# Patient Record
Sex: Female | Born: 1960
Health system: Southern US, Community
[De-identification: ages and names within clinical notes are randomized; demographics above are authoritative.]

## PROBLEM LIST (undated history)

## (undated) ENCOUNTER — Emergency Department (HOSPITAL_COMMUNITY): Payer: Medicaid Other | Source: Home / Self Care

## (undated) DIAGNOSIS — L97509 Non-pressure chronic ulcer of other part of unspecified foot with unspecified severity: Secondary | ICD-10-CM

## (undated) DIAGNOSIS — E7800 Pure hypercholesterolemia, unspecified: Secondary | ICD-10-CM

## (undated) DIAGNOSIS — R809 Proteinuria, unspecified: Secondary | ICD-10-CM

## (undated) DIAGNOSIS — Z720 Tobacco use: Secondary | ICD-10-CM

## (undated) DIAGNOSIS — E1142 Type 2 diabetes mellitus with diabetic polyneuropathy: Secondary | ICD-10-CM

## (undated) DIAGNOSIS — G629 Polyneuropathy, unspecified: Secondary | ICD-10-CM

## (undated) DIAGNOSIS — E119 Type 2 diabetes mellitus without complications: Secondary | ICD-10-CM

## (undated) DIAGNOSIS — E669 Obesity, unspecified: Secondary | ICD-10-CM

## (undated) DIAGNOSIS — K5900 Constipation, unspecified: Secondary | ICD-10-CM

## (undated) DIAGNOSIS — E559 Vitamin D deficiency, unspecified: Secondary | ICD-10-CM

## (undated) DIAGNOSIS — L0390 Cellulitis, unspecified: Secondary | ICD-10-CM

## (undated) DIAGNOSIS — I739 Peripheral vascular disease, unspecified: Secondary | ICD-10-CM

## (undated) DIAGNOSIS — I2510 Atherosclerotic heart disease of native coronary artery without angina pectoris: Secondary | ICD-10-CM

## (undated) DIAGNOSIS — J449 Chronic obstructive pulmonary disease, unspecified: Secondary | ICD-10-CM

## (undated) DIAGNOSIS — E1140 Type 2 diabetes mellitus with diabetic neuropathy, unspecified: Secondary | ICD-10-CM

## (undated) DIAGNOSIS — I10 Essential (primary) hypertension: Secondary | ICD-10-CM

## (undated) DIAGNOSIS — I509 Heart failure, unspecified: Secondary | ICD-10-CM

## (undated) DIAGNOSIS — B351 Tinea unguium: Secondary | ICD-10-CM

## (undated) DIAGNOSIS — I5031 Acute diastolic (congestive) heart failure: Secondary | ICD-10-CM

## (undated) DIAGNOSIS — I214 Non-ST elevation (NSTEMI) myocardial infarction: Secondary | ICD-10-CM

## (undated) DIAGNOSIS — R079 Chest pain, unspecified: Secondary | ICD-10-CM

## (undated) DIAGNOSIS — R0600 Dyspnea, unspecified: Secondary | ICD-10-CM

## (undated) DIAGNOSIS — R519 Headache, unspecified: Secondary | ICD-10-CM

## (undated) DIAGNOSIS — N186 End stage renal disease: Secondary | ICD-10-CM

## (undated) DIAGNOSIS — IMO0002 Reserved for concepts with insufficient information to code with codable children: Secondary | ICD-10-CM

## (undated) DIAGNOSIS — Z8674 Personal history of sudden cardiac arrest: Secondary | ICD-10-CM

## (undated) DIAGNOSIS — E1151 Type 2 diabetes mellitus with diabetic peripheral angiopathy without gangrene: Secondary | ICD-10-CM

## (undated) DIAGNOSIS — Z9889 Other specified postprocedural states: Secondary | ICD-10-CM

## (undated) DIAGNOSIS — D649 Anemia, unspecified: Secondary | ICD-10-CM

## (undated) DIAGNOSIS — E11621 Type 2 diabetes mellitus with foot ulcer: Secondary | ICD-10-CM

## (undated) DIAGNOSIS — E785 Hyperlipidemia, unspecified: Secondary | ICD-10-CM

## (undated) DIAGNOSIS — N184 Chronic kidney disease, stage 4 (severe): Secondary | ICD-10-CM

## (undated) DIAGNOSIS — E781 Pure hyperglyceridemia: Secondary | ICD-10-CM

## (undated) DIAGNOSIS — N952 Postmenopausal atrophic vaginitis: Secondary | ICD-10-CM

## (undated) DIAGNOSIS — M79606 Pain in leg, unspecified: Secondary | ICD-10-CM

## (undated) DIAGNOSIS — Z89432 Acquired absence of left foot: Secondary | ICD-10-CM

## (undated) DIAGNOSIS — R0609 Other forms of dyspnea: Secondary | ICD-10-CM

## (undated) DIAGNOSIS — I5032 Chronic diastolic (congestive) heart failure: Secondary | ICD-10-CM

## (undated) DIAGNOSIS — N189 Chronic kidney disease, unspecified: Secondary | ICD-10-CM

## (undated) DIAGNOSIS — I110 Hypertensive heart disease with heart failure: Secondary | ICD-10-CM

## (undated) HISTORY — DX: Proteinuria, unspecified: R80.9

## (undated) HISTORY — DX: Tobacco use: Z72.0

## (undated) HISTORY — DX: Cellulitis, unspecified: L03.90

## (undated) HISTORY — DX: Peripheral vascular disease, unspecified: I73.9

## (undated) HISTORY — DX: Other forms of dyspnea: R06.09

## (undated) HISTORY — DX: Personal history of sudden cardiac arrest: Z86.74

## (undated) HISTORY — DX: Hyperlipidemia, unspecified: E78.5

## (undated) HISTORY — DX: Chest pain, unspecified: R07.9

## (undated) HISTORY — DX: Type 2 diabetes mellitus with foot ulcer: E11.621

## (undated) HISTORY — DX: Heart failure, unspecified: I50.9

## (undated) HISTORY — DX: Obesity, unspecified: E66.9

## (undated) HISTORY — PX: ABDOMINAL HYSTERECTOMY: SHX81

## (undated) HISTORY — DX: Chronic kidney disease, stage 4 (severe): N18.4

## (undated) HISTORY — DX: Pain in leg, unspecified: M79.606

## (undated) HISTORY — DX: Polyneuropathy, unspecified: G62.9

## (undated) HISTORY — DX: Non-ST elevation (NSTEMI) myocardial infarction: I21.4

## (undated) HISTORY — DX: Type 2 diabetes mellitus with diabetic peripheral angiopathy without gangrene: E11.51

## (undated) HISTORY — DX: Vitamin D deficiency, unspecified: E55.9

## (undated) HISTORY — DX: Tinea unguium: B35.1

## (undated) HISTORY — DX: Acute diastolic (congestive) heart failure: I50.31

## (undated) HISTORY — DX: Type 2 diabetes mellitus with diabetic neuropathy, unspecified: E11.40

## (undated) HISTORY — DX: Chronic kidney disease, unspecified: N18.9

## (undated) HISTORY — DX: Non-pressure chronic ulcer of other part of unspecified foot with unspecified severity: L97.509

## (undated) HISTORY — DX: Type 2 diabetes mellitus with diabetic polyneuropathy: E11.42

## (undated) HISTORY — DX: Type 2 diabetes mellitus without complications: E11.9

## (undated) HISTORY — DX: Reserved for concepts with insufficient information to code with codable children: IMO0002

## (undated) HISTORY — DX: Postmenopausal atrophic vaginitis: N95.2

## (undated) HISTORY — DX: Anemia, unspecified: D64.9

## (undated) HISTORY — DX: Hypertensive heart disease with heart failure: I11.0

## (undated) HISTORY — DX: Chronic obstructive pulmonary disease, unspecified: J44.9

## (undated) HISTORY — DX: Pure hyperglyceridemia: E78.1

## (undated) HISTORY — DX: Essential (primary) hypertension: I10

## (undated) HISTORY — DX: Atherosclerotic heart disease of native coronary artery without angina pectoris: I25.10

## (undated) HISTORY — PX: COLONOSCOPY: SHX174

## (undated) HISTORY — DX: Other specified postprocedural states: Z98.890

## (undated) HISTORY — DX: Acquired absence of left foot: Z89.432

## (undated) HISTORY — DX: Chronic diastolic (congestive) heart failure: I50.32

---

## 2009-02-14 ENCOUNTER — Encounter: Admission: RE | Admit: 2009-02-14 | Discharge: 2009-02-14 | Payer: Self-pay | Admitting: Gastroenterology

## 2009-04-30 ENCOUNTER — Ambulatory Visit (HOSPITAL_BASED_OUTPATIENT_CLINIC_OR_DEPARTMENT_OTHER): Admission: RE | Admit: 2009-04-30 | Discharge: 2009-04-30 | Payer: Self-pay | Admitting: Internal Medicine

## 2009-04-30 ENCOUNTER — Ambulatory Visit: Payer: Self-pay | Admitting: Radiology

## 2012-06-07 DIAGNOSIS — E119 Type 2 diabetes mellitus without complications: Secondary | ICD-10-CM

## 2012-06-07 DIAGNOSIS — N952 Postmenopausal atrophic vaginitis: Secondary | ICD-10-CM

## 2012-06-07 HISTORY — DX: Type 2 diabetes mellitus without complications: E11.9

## 2012-06-07 HISTORY — DX: Postmenopausal atrophic vaginitis: N95.2

## 2012-06-10 DIAGNOSIS — Z9889 Other specified postprocedural states: Secondary | ICD-10-CM

## 2012-06-10 DIAGNOSIS — Z720 Tobacco use: Secondary | ICD-10-CM | POA: Insufficient documentation

## 2012-06-10 DIAGNOSIS — M79606 Pain in leg, unspecified: Secondary | ICD-10-CM | POA: Insufficient documentation

## 2012-06-10 DIAGNOSIS — I10 Essential (primary) hypertension: Secondary | ICD-10-CM

## 2012-06-10 HISTORY — DX: Essential (primary) hypertension: I10

## 2012-06-10 HISTORY — DX: Pain in leg, unspecified: M79.606

## 2012-06-10 HISTORY — DX: Other specified postprocedural states: Z98.890

## 2012-06-10 HISTORY — DX: Tobacco use: Z72.0

## 2012-06-15 ENCOUNTER — Other Ambulatory Visit (HOSPITAL_COMMUNITY): Payer: Self-pay | Admitting: Family Medicine

## 2012-06-15 DIAGNOSIS — M79609 Pain in unspecified limb: Secondary | ICD-10-CM

## 2012-06-15 DIAGNOSIS — R0989 Other specified symptoms and signs involving the circulatory and respiratory systems: Secondary | ICD-10-CM

## 2012-06-22 ENCOUNTER — Ambulatory Visit (HOSPITAL_COMMUNITY)
Admission: RE | Admit: 2012-06-22 | Discharge: 2012-06-22 | Disposition: A | Discharge status: Home / Self Care | Payer: PRIVATE HEALTH INSURANCE | Source: Ambulatory Visit | Attending: Cardiovascular Disease | Admitting: Cardiovascular Disease

## 2012-06-22 DIAGNOSIS — M79609 Pain in unspecified limb: Secondary | ICD-10-CM | POA: Insufficient documentation

## 2012-06-22 DIAGNOSIS — R0989 Other specified symptoms and signs involving the circulatory and respiratory systems: Secondary | ICD-10-CM

## 2012-06-22 DIAGNOSIS — I70219 Atherosclerosis of native arteries of extremities with intermittent claudication, unspecified extremity: Secondary | ICD-10-CM | POA: Insufficient documentation

## 2012-06-22 NOTE — Progress Notes (Signed)
ABI Completed. Moderate arterial disease on the right and severe arterial disease on the left. Rebekah Johnson

## 2012-06-30 ENCOUNTER — Other Ambulatory Visit: Payer: Self-pay | Admitting: Otolaryngology

## 2012-06-30 DIAGNOSIS — E079 Disorder of thyroid, unspecified: Secondary | ICD-10-CM

## 2012-07-21 ENCOUNTER — Telehealth: Payer: Self-pay | Admitting: Family Medicine

## 2012-07-21 ENCOUNTER — Other Ambulatory Visit: Payer: Self-pay | Admitting: Family Medicine

## 2012-07-21 DIAGNOSIS — F17200 Nicotine dependence, unspecified, uncomplicated: Secondary | ICD-10-CM

## 2012-07-21 DIAGNOSIS — R0989 Other specified symptoms and signs involving the circulatory and respiratory systems: Secondary | ICD-10-CM

## 2012-07-21 DIAGNOSIS — M79609 Pain in unspecified limb: Secondary | ICD-10-CM

## 2012-07-26 ENCOUNTER — Telehealth (HOSPITAL_COMMUNITY): Payer: Self-pay | Admitting: Family Medicine

## 2012-07-28 DIAGNOSIS — E559 Vitamin D deficiency, unspecified: Secondary | ICD-10-CM

## 2012-07-28 DIAGNOSIS — E781 Pure hyperglyceridemia: Secondary | ICD-10-CM | POA: Insufficient documentation

## 2012-07-28 DIAGNOSIS — I10 Essential (primary) hypertension: Secondary | ICD-10-CM | POA: Insufficient documentation

## 2012-07-28 DIAGNOSIS — I739 Peripheral vascular disease, unspecified: Secondary | ICD-10-CM

## 2012-07-28 HISTORY — DX: Vitamin D deficiency, unspecified: E55.9

## 2012-07-28 HISTORY — DX: Pure hyperglyceridemia: E78.1

## 2012-07-28 HISTORY — DX: Peripheral vascular disease, unspecified: I73.9

## 2012-08-10 ENCOUNTER — Ambulatory Visit
Admission: RE | Admit: 2012-08-10 | Discharge: 2012-08-10 | Disposition: A | Discharge status: Home / Self Care | Payer: PRIVATE HEALTH INSURANCE | Source: Ambulatory Visit | Attending: Otolaryngology | Admitting: Otolaryngology

## 2012-08-10 ENCOUNTER — Other Ambulatory Visit (HOSPITAL_COMMUNITY)
Admission: RE | Admit: 2012-08-10 | Discharge: 2012-08-10 | Disposition: A | Discharge status: Home / Self Care | Payer: PRIVATE HEALTH INSURANCE | Source: Ambulatory Visit | Attending: Otolaryngology | Admitting: Otolaryngology

## 2012-08-10 DIAGNOSIS — E079 Disorder of thyroid, unspecified: Secondary | ICD-10-CM

## 2012-08-10 DIAGNOSIS — E041 Nontoxic single thyroid nodule: Secondary | ICD-10-CM | POA: Insufficient documentation

## 2012-09-02 DIAGNOSIS — E785 Hyperlipidemia, unspecified: Secondary | ICD-10-CM

## 2012-09-02 DIAGNOSIS — G629 Polyneuropathy, unspecified: Secondary | ICD-10-CM | POA: Insufficient documentation

## 2012-09-02 DIAGNOSIS — E1151 Type 2 diabetes mellitus with diabetic peripheral angiopathy without gangrene: Secondary | ICD-10-CM

## 2012-09-02 HISTORY — DX: Type 2 diabetes mellitus with diabetic peripheral angiopathy without gangrene: E11.51

## 2012-09-02 HISTORY — DX: Hyperlipidemia, unspecified: E78.5

## 2012-09-02 HISTORY — DX: Polyneuropathy, unspecified: G62.9

## 2012-09-29 ENCOUNTER — Encounter (INDEPENDENT_AMBULATORY_CARE_PROVIDER_SITE_OTHER): Payer: PRIVATE HEALTH INSURANCE | Admitting: Ophthalmology

## 2012-09-29 DIAGNOSIS — E11319 Type 2 diabetes mellitus with unspecified diabetic retinopathy without macular edema: Secondary | ICD-10-CM

## 2012-09-29 DIAGNOSIS — I10 Essential (primary) hypertension: Secondary | ICD-10-CM

## 2012-09-29 DIAGNOSIS — E1139 Type 2 diabetes mellitus with other diabetic ophthalmic complication: Secondary | ICD-10-CM

## 2012-09-29 DIAGNOSIS — H43819 Vitreous degeneration, unspecified eye: Secondary | ICD-10-CM

## 2012-09-29 DIAGNOSIS — H35039 Hypertensive retinopathy, unspecified eye: Secondary | ICD-10-CM

## 2012-09-29 DIAGNOSIS — H3581 Retinal edema: Secondary | ICD-10-CM

## 2012-10-11 ENCOUNTER — Encounter (INDEPENDENT_AMBULATORY_CARE_PROVIDER_SITE_OTHER): Payer: PRIVATE HEALTH INSURANCE | Admitting: Ophthalmology

## 2012-10-11 DIAGNOSIS — H3581 Retinal edema: Secondary | ICD-10-CM

## 2012-10-11 DIAGNOSIS — E1139 Type 2 diabetes mellitus with other diabetic ophthalmic complication: Secondary | ICD-10-CM

## 2012-10-25 ENCOUNTER — Other Ambulatory Visit (INDEPENDENT_AMBULATORY_CARE_PROVIDER_SITE_OTHER): Payer: PRIVATE HEALTH INSURANCE | Admitting: Ophthalmology

## 2012-10-25 DIAGNOSIS — H3581 Retinal edema: Secondary | ICD-10-CM

## 2012-10-25 DIAGNOSIS — E1139 Type 2 diabetes mellitus with other diabetic ophthalmic complication: Secondary | ICD-10-CM

## 2013-03-01 ENCOUNTER — Ambulatory Visit (INDEPENDENT_AMBULATORY_CARE_PROVIDER_SITE_OTHER): Payer: PRIVATE HEALTH INSURANCE | Admitting: Ophthalmology

## 2013-03-15 ENCOUNTER — Ambulatory Visit (INDEPENDENT_AMBULATORY_CARE_PROVIDER_SITE_OTHER): Payer: PRIVATE HEALTH INSURANCE | Admitting: Ophthalmology

## 2013-03-15 DIAGNOSIS — I779 Disorder of arteries and arterioles, unspecified: Secondary | ICD-10-CM | POA: Insufficient documentation

## 2013-03-23 ENCOUNTER — Ambulatory Visit (INDEPENDENT_AMBULATORY_CARE_PROVIDER_SITE_OTHER): Payer: PRIVATE HEALTH INSURANCE | Admitting: Ophthalmology

## 2015-06-29 ENCOUNTER — Emergency Department (HOSPITAL_BASED_OUTPATIENT_CLINIC_OR_DEPARTMENT_OTHER): Payer: Self-pay

## 2015-06-29 ENCOUNTER — Emergency Department (HOSPITAL_BASED_OUTPATIENT_CLINIC_OR_DEPARTMENT_OTHER)
Admission: EM | Admit: 2015-06-29 | Discharge: 2015-06-30 | Disposition: A | Discharge status: Home / Self Care | Payer: Self-pay | Attending: Emergency Medicine | Admitting: Emergency Medicine

## 2015-06-29 ENCOUNTER — Encounter (HOSPITAL_BASED_OUTPATIENT_CLINIC_OR_DEPARTMENT_OTHER): Payer: Self-pay | Admitting: *Deleted

## 2015-06-29 DIAGNOSIS — E11628 Type 2 diabetes mellitus with other skin complications: Secondary | ICD-10-CM

## 2015-06-29 DIAGNOSIS — E1169 Type 2 diabetes mellitus with other specified complication: Secondary | ICD-10-CM | POA: Insufficient documentation

## 2015-06-29 DIAGNOSIS — I10 Essential (primary) hypertension: Secondary | ICD-10-CM | POA: Insufficient documentation

## 2015-06-29 DIAGNOSIS — F17210 Nicotine dependence, cigarettes, uncomplicated: Secondary | ICD-10-CM | POA: Insufficient documentation

## 2015-06-29 DIAGNOSIS — L089 Local infection of the skin and subcutaneous tissue, unspecified: Secondary | ICD-10-CM | POA: Insufficient documentation

## 2015-06-29 HISTORY — DX: Essential (primary) hypertension: I10

## 2015-06-29 HISTORY — DX: Pure hypercholesterolemia, unspecified: E78.00

## 2015-06-29 LAB — CBC WITH DIFFERENTIAL/PLATELET
BASOS ABS: 0 10*3/uL (ref 0.0–0.1)
Basophils Relative: 0 %
EOS ABS: 0.2 10*3/uL (ref 0.0–0.7)
EOS PCT: 1 %
HCT: 42.1 % (ref 36.0–46.0)
Hemoglobin: 13.9 g/dL (ref 12.0–15.0)
LYMPHS ABS: 3.7 10*3/uL (ref 0.7–4.0)
Lymphocytes Relative: 33 %
MCH: 26.5 pg (ref 26.0–34.0)
MCHC: 33 g/dL (ref 30.0–36.0)
MCV: 80.2 fL (ref 78.0–100.0)
Monocytes Absolute: 0.9 10*3/uL (ref 0.1–1.0)
Monocytes Relative: 8 %
Neutro Abs: 6.6 10*3/uL (ref 1.7–7.7)
Neutrophils Relative %: 58 %
PLATELETS: 305 10*3/uL (ref 150–400)
RBC: 5.25 MIL/uL — AB (ref 3.87–5.11)
RDW: 14.1 % (ref 11.5–15.5)
WBC: 11.4 10*3/uL — AB (ref 4.0–10.5)

## 2015-06-29 LAB — BASIC METABOLIC PANEL
Anion gap: 11 (ref 5–15)
BUN: 17 mg/dL (ref 6–20)
CALCIUM: 9.9 mg/dL (ref 8.9–10.3)
CO2: 26 mmol/L (ref 22–32)
Chloride: 101 mmol/L (ref 101–111)
Creatinine, Ser: 1.14 mg/dL — ABNORMAL HIGH (ref 0.44–1.00)
GFR calc Af Amer: >60 mL/min (ref >60)
GFR, EST NON AFRICAN AMERICAN: 52 mL/min — AB (ref >60)
Glucose, Bld: 187 mg/dL — ABNORMAL HIGH (ref 65–99)
POTASSIUM: 3.5 mmol/L (ref 3.5–5.1)
SODIUM: 138 mmol/L (ref 135–145)

## 2015-06-29 LAB — CBG MONITORING, ED: GLUCOSE-CAPILLARY: 217 mg/dL — AB (ref 65–99)

## 2015-06-29 MED ORDER — GABAPENTIN 300 MG PO CAPS: 300.0000 mg | ORAL_CAPSULE | Freq: Three times a day (TID) | ORAL | Status: DC

## 2015-06-29 MED ORDER — CIPROFLOXACIN IN D5W 400 MG/200ML IV SOLN: 400.0000 mg | Freq: Once | INTRAVENOUS | Status: DC

## 2015-06-29 MED ORDER — CLINDAMYCIN HCL 150 MG PO CAPS: 450.0000 mg | ORAL_CAPSULE | Freq: Three times a day (TID) | ORAL | Status: DC

## 2015-06-29 MED ORDER — CIPROFLOXACIN HCL 500 MG PO TABS: 500.0000 mg | ORAL_TABLET | Freq: Once | ORAL | Status: DC

## 2015-06-29 MED ORDER — CIPROFLOXACIN HCL 500 MG PO TABS
500.0000 mg | ORAL_TABLET | Freq: Once | ORAL | Status: AC
  Administered 2015-06-29: 500 mg via ORAL
  Filled 2015-06-29: qty 1

## 2015-06-29 MED ORDER — CLINDAMYCIN HCL 150 MG PO CAPS
300.0000 mg | ORAL_CAPSULE | Freq: Once | ORAL | Status: AC
  Administered 2015-06-29: 300 mg via ORAL
  Filled 2015-06-29: qty 2

## 2015-06-29 NOTE — ED Provider Notes (Signed)
CSN: [id number]     Arrival date & time 06/29/15  1655 History   First MD Initiated Contact with Patient 06/29/15 1953     Chief Complaint  Patient presents with  . Foot Swelling     (Consider location/radiation/quality/duration/timing/severity/associated sxs/prior Treatment) HPI Patient presents to the emergency department with foot pain and swelling.  Patient states that her second toe on the left foot is swollen and the toenail fell off.  Patient states that movement and palpation make the pain worse.  She states she is diabetic and does have some neuropathy.  Patient states that nothing seems make the condition better, movement and palpation makes the pain worse. The patient denies chest pain, shortness of breath, headache,blurred vision, neck pain, fever, cough, weakness, numbness, dizziness, anorexia, edema, abdominal pain, nausea, vomiting, diarrhea, rash, back pain, dysuria, hematemesis, bloody stool, near syncope, or syncope.  Patient did not take any medication prior to arrival Past Medical History  Diagnosis Date  . Diabetes mellitus without complication ([place])   . Hypertension   . High cholesterol    Past Surgical History  Procedure Laterality Date  . Abdominal hysterectomy     No family history on file. Social History  Substance Use Topics  . Smoking status: Current Every Day Smoker -- 1.00 packs/day    Types: Cigarettes  . Smokeless tobacco: None  . Alcohol Use: No   OB History    No data available     Review of Systems  All other systems negative except as documented in the HPI. All pertinent positives and negatives as reviewed in the HPI.  Allergies  Review of patient's allergies indicates no known allergies.  Home Medications   Prior to Admission medications   Not on File   BP 145/92 mmHg  Pulse 62  Temp(Src) 98 F (36.7 C) (Oral)  Resp 18  Wt 81.647 kg  SpO2 100% Physical Exam  Constitutional: She is oriented to person, place, and time. She  appears well-developed and well-nourished. No distress.  HENT:  Head: Normocephalic and atraumatic.  Mouth/Throat: Oropharynx is clear and moist.  Eyes: Pupils are equal, round, and reactive to light.  Neck: Normal range of motion. Neck supple.  Cardiovascular: Normal rate, regular rhythm and normal heart sounds.  Exam reveals no gallop and no friction rub.   No murmur heard. Pulmonary/Chest: Effort normal and breath sounds normal. No respiratory distress. She has no wheezes.  Abdominal: Soft. Bowel sounds are normal. She exhibits no distension. There is no tenderness.  Musculoskeletal:       Feet:  Neurological: She is alert and oriented to person, place, and time. She exhibits normal muscle tone. Coordination normal.  Skin: Skin is warm and dry. No rash noted. No erythema.  Psychiatric: She has a normal mood and affect. Her behavior is normal.  Nursing note and vitals reviewed.   ED Course  Procedures (including critical care time) Labs Review Labs Reviewed  CBC WITH DIFFERENTIAL/PLATELET - Abnormal; Notable for the following:    WBC 11.4 (*)    RBC 5.25 (*)    All other components within normal limits  BASIC METABOLIC PANEL - Abnormal; Notable for the following:    Glucose, Bld 187 (*)    Creatinine, Ser 1.14 (*)    GFR calc non Af Amer 52 (*)    All other components within normal limits  CBG MONITORING, ED - Abnormal; Notable for the following:    Glucose-Capillary 217 (*)    All other components within  normal limits  CBG MONITORING, ED    Imaging Review Dg Foot Complete Left  06/29/2015  CLINICAL DATA:  Pain from the medial right ankle to the great toe with no known trauma. EXAM: LEFT FOOT - COMPLETE 3+ VIEW COMPARISON:  None. FINDINGS: Soft tissue swelling is identified. Calcifications are seen in the soft tissues of the medial hindfoot which are nonspecific but probably not acute given history. No acute fractures or dislocations. IMPRESSION: Soft-tissue swelling with no  acute fracture identified. Superficial soft tissue calcifications medially in the hindfoot are probably not acute. Electronically Signed   By: Dorise Bullion III M.D   On: 06/29/2015 18:58   I have personally reviewed and evaluated these images and lab results as part of my medical decision-making.   Patient be treated for this possible diabetic wound on her toe.  Told to follow up with her primary care doctor and the podiatrist that she sees.  The patient agrees the plan and all questions were answered.  She is stable at this time.  Her vital signs have remained stable as well  Dalia Heading, PA-C 07/01/15 [place], MD 07/01/15 [phone]

## 2015-06-29 NOTE — ED Notes (Signed)
Able to obtain dp and pt pulses with doppler. Ares marked. Faint, but obtainable with difficulty.

## 2015-06-29 NOTE — Discharge Instructions (Signed)
I have printed a coupon for the clindamycin that you can purchase at [employer].  You can also get the Cipro at [facility] for free or $4 at [facility]

## 2015-06-29 NOTE — ED Notes (Signed)
Pt with hx of diabeters is here with pain and swelling to left left great toe and middle toe.  CBG in triage 217.  Pt states that this has been getting worse over the past few days, no fever or chills

## 2015-06-29 NOTE — ED Notes (Signed)
Discussed with Lawyer, PA lack of IV access and availability of PO abx ordered. Will order PO instead.

## 2015-07-02 ENCOUNTER — Ambulatory Visit (INDEPENDENT_AMBULATORY_CARE_PROVIDER_SITE_OTHER): Payer: Self-pay | Admitting: Family Medicine

## 2015-07-02 ENCOUNTER — Encounter: Payer: Self-pay | Admitting: Family Medicine

## 2015-07-02 VITALS — BP 136/70 | HR 72 | Temp 98.0°F | Resp 20 | Ht 64.0 in | Wt 180.0 lb

## 2015-07-02 DIAGNOSIS — E1140 Type 2 diabetes mellitus with diabetic neuropathy, unspecified: Secondary | ICD-10-CM

## 2015-07-02 DIAGNOSIS — I10 Essential (primary) hypertension: Secondary | ICD-10-CM

## 2015-07-02 DIAGNOSIS — E1159 Type 2 diabetes mellitus with other circulatory complications: Secondary | ICD-10-CM

## 2015-07-02 DIAGNOSIS — E1169 Type 2 diabetes mellitus with other specified complication: Secondary | ICD-10-CM

## 2015-07-02 DIAGNOSIS — E11628 Type 2 diabetes mellitus with other skin complications: Secondary | ICD-10-CM

## 2015-07-02 DIAGNOSIS — L03116 Cellulitis of left lower limb: Secondary | ICD-10-CM

## 2015-07-02 DIAGNOSIS — L089 Local infection of the skin and subcutaneous tissue, unspecified: Secondary | ICD-10-CM

## 2015-07-02 LAB — GLUCOSE, POCT (MANUAL RESULT ENTRY): POC Glucose: 136 mg/dl — AB (ref 70–99)

## 2015-07-02 MED ORDER — CEFTRIAXONE SODIUM 1 G IJ SOLR
1.0000 g | Freq: Once | INTRAMUSCULAR | Status: AC
  Administered 2015-07-02: 1 g via INTRAMUSCULAR

## 2015-07-02 MED ORDER — TRAMADOL HCL 50 MG PO TABS: 50.0000 mg | ORAL_TABLET | Freq: Three times a day (TID) | ORAL | Status: DC | PRN

## 2015-07-02 NOTE — Progress Notes (Signed)
[facility]   Chief Complaint:  Chief Complaint  Patient presents with  . Establish Care  . Follow-up    Seen at [facility] on 6-24 dx diabetic foot infection    HPI: Rebekah Johnson is a 55 y.o. female who reports to [facility] today complaining of left foot pain and infection  1. Discharged from ER 06/29/15 and was given cipro and clindamycin, she has not been able to pick either prescription. She has no money. She denies any fevers. She has had DM for 10 years , not on meds cannot afford it, she has been without meds since Decmeber 2016. She has diabetic neuropathy. She currently denies any fevers or chills. She has pain in the left foot. Has had  1 week history of worsenign sxs, started off as a  blister on tip of 2nd left toe, she went to ER and was given oral cipro and clindamycin x 1 dose and then was dc for ER. She had a bedside doppler and the DP and PT was intact and xray showed no boney involvement. She was given Po abx and told to fu with podiatry. See picture and xray results below:  IMPRESSION: Soft-tissue swelling with no acute fracture identified. Superficial soft tissue calcifications medially in the hindfoot are probably not acute.   Electronically Signed  By: Dorise Bullion III M.D  On: 06/29/2015 18:58     2. 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 She is tryign to [employer] her chronic medical issues with diet.       Past Medical History  Diagnosis Date  . Diabetes mellitus without complication ([place])   . Hypertension   . High cholesterol    Past Surgical History  Procedure Laterality Date  . Abdominal hysterectomy     Social History   Social History  . Marital Status: Married    Spouse Name: N/A  . Number of Children: N/A  . Years of Education: N/A   Social History Main Topics  . Smoking status: Current Every Day Smoker -- 1.00 packs/day     Types: Cigarettes  . Smokeless tobacco: Never Used  . Alcohol Use: 0.0 oz/week    0 Standard drinks or equivalent per week     Comment: on social occassions  . Drug Use: No  . Sexual Activity:    Partners: Male   Other Topics Concern  . None   Social History Narrative   Family History  Problem Relation Age of Onset  . Diabetes Mother   . Hypertension Father    No Known Allergies Prior to Admission medications   Medication Sig Start Date End Date Taking? Authorizing Provider  aspirin 81 MG EC tablet Take 1 tablet by mouth daily. Reported on 07/02/2015   Yes Historical Provider, MD  Cholecalciferol (VITAMIN D-3) 1000 units CAPS Take 1 capsule by mouth daily.   Yes Historical Provider, MD  Omega-3 Fatty Acids (FISH OIL) 1000 MG CAPS Take 1 capsule by mouth daily.   Yes Historical Provider, MD  amLODipine (NORVASC) 10 MG tablet Take 1 tablet by mouth daily. Reported on 07/02/2015 11/13/14 11/13/15  Barrie Lyme, FNP  atorvastatin (LIPITOR) 40 MG tablet Take 1 tablet by mouth daily. Reported on 07/02/2015 11/13/14   Barrie Lyme, FNP  carvedilol (COREG) 6.25 MG tablet Take 0.5 tablets by mouth 2 (two) times daily with a meal. Reported on 07/02/2015 11/13/14 11/13/15  Barrie Lyme, FNP  ciprofloxacin (CIPRO) 500 MG tablet Take 1 tablet (500 mg total) by mouth once. Patient not taking: Reported on 07/02/2015 06/29/15   Dalia Heading, PA-C  clindamycin (CLEOCIN) 150 MG capsule Take 3 capsules (450 mg total) by mouth 3 (three) times daily. Patient not taking: Reported on 07/02/2015 06/29/15   Dalia Heading, PA-C  conjugated estrogens (PREMARIN) vaginal cream Place 0.5 Applicatorfuls vaginally 2 (two) times a week. Reported on 07/02/2015 08/20/14   Barrie Lyme, FNP  gabapentin (NEURONTIN) 300 MG capsule Take 1 capsule (300 mg total) by mouth 3 (three) times daily. Patient not taking: Reported on 07/02/2015 06/29/15   Dalia Heading, PA-C  gemfibrozil (LOPID) 600 MG tablet Take 1  tablet by mouth 2 (two) times daily. Reported on 07/02/2015 11/13/14 11/13/15  Barrie Lyme, FNP  glipiZIDE (GLIPIZIDE XL) 10 MG 24 hr tablet Take 1 tablet by mouth 2 (two) times daily. Reported on 07/02/2015 11/13/14   Historical Provider, MD  sitaGLIPtin-metformin (JANUMET) 50-1000 MG tablet Take 1 tablet by mouth 2 (two) times daily with a meal. Reported on 07/02/2015 11/13/14   Barrie Lyme, FNP     ROS: The patient denies fevers, chills, night sweats, unintentional weight loss, chest pain, palpitations, wheezing, dyspnea on exertion, nausea, vomiting, abdominal pain, dysuria, hematuria, melena, + numbness, weakness, or tingling.   All other systems have been reviewed and were otherwise negative with the exception of those mentioned in the HPI and as above.    PHYSICAL EXAM: Filed Vitals:   07/02/15 1511  BP: 136/70  Pulse: 72  Temp: 98 F (36.7 C)  Resp: 20   Body mass index is 30.88 kg/(m^2).   General: Alert, mild acute distress HEENT:  Normocephalic, atraumatic, oropharynx patent. EOMI, PERRLA, poor dentition Cardiovascular:  Regular rate and rhythm, no rubs murmurs or gallops.   Respiratory: Clear to auscultation bilaterally.  No wheezes, rales, or rhonchi.  No cyanosis, no use of accessory musculature Abdominal: No organomegaly, abdomen is soft and non-tender, positive bowel sounds. No masses. Skin: + erythema, + warmth above ankles. + edema  Unable to fully appreciate DP but can palpate  Posterior tib.  Neurologic: Facial musculature symmetric. Psychiatric: Patient acts appropriately throughout our interaction. Musculoskeletal: Gait antalgic. + left foot edema, tenderness   LABS: Results for orders placed or performed in visit on 07/02/15  POCT Glucose (CBG)-Manual entry (CPT [phone])  Result Value Ref Range   POC Glucose 136 (A) 70 - 99 mg/dl     EKG/XRAY:   Primary read interpreted by Dr. Marin Comment at [facility].   ASSESSMENT/PLAN: Encounter Diagnoses  Name Primary?  . Type  2 diabetes mellitus with other circulatory complication (HCC) Yes  . Type 2 diabetes mellitus with diabetic neuropathy, without long-term current use of insulin ([place])   . Diabetic infection of left foot ([place])   . Essential hypertension   . Cellulitis of left lower extremity    55 y/o AA female with a PMH of HTN, T2DM with neuropathy, HDL, tobacco abuse and also noncompliance due to lack of insurance.  Patient unable to get meds for cellulitis and diabetic foot infection until Friday when her husband gets paid,she can only afford $4 of abx and also some pain meds Will bring patient  back tomorrow to make sure she is doing ok with the cellulitis. Recheck sugars to see if still within good range Rocephin 1 gram given today,hold off on getting cipro and clindamycin for now until re-evaluation tomorrow. She is  unable to afford it at this time.   Treat with Rocephin for Diabetic foot cellulitis  Will get patient assistant for all medications for DM  Rx tramadol 50 mg q 8 hours prn pain  Fu in 1 day , enroll in orange card program and MAP program Precautions given     Gross sideeffects, risk and benefits, and alternatives of medications d/w patient. Patient is aware that all medications have potential sideeffects and we are unable to predict every sideeffect or drug-drug interaction that may occur.  Qusay Villada DO  07/02/2015 5:16 PM

## 2015-07-02 NOTE — Patient Instructions (Signed)
Diabetes and Foot Care Diabetes may cause you to have problems because of poor blood supply (circulation) to your feet and legs. This may cause the skin on your feet to become thinner, break easier, and heal more slowly. Your skin may become dry, and the skin may peel and crack. You may also have nerve damage in your legs and feet causing decreased feeling in them. You may not notice minor injuries to your feet that could lead to infections or more serious problems. Taking care of your feet is one of the most important things you can do for yourself.  HOME CARE INSTRUCTIONS  Wear shoes at all times, even in the house. Do not go barefoot. Bare feet are easily injured.  Check your feet daily for blisters, cuts, and redness. If you cannot see the bottom of your feet, use a mirror or ask someone for help.  Wash your feet with warm water (do not use hot water) and mild soap. Then pat your feet and the areas between your toes until they are completely dry. Do not soak your feet as this can dry your skin.  Apply a moisturizing lotion or petroleum jelly (that does not contain alcohol and is unscented) to the skin on your feet and to dry, brittle toenails. Do not apply lotion between your toes.  Trim your toenails straight across. Do not dig under them or around the cuticle. File the edges of your nails with an emery board or nail file.  Do not cut corns or calluses or try to remove them with medicine.  Wear clean socks or stockings every day. Make sure they are not too tight. Do not wear knee-high stockings since they may decrease blood flow to your legs.  Wear shoes that fit properly and have enough cushioning. To break in new shoes, wear them for just a few hours a day. This prevents you from injuring your feet. Always look in your shoes before you put them on to be sure there are no objects inside.  Do not cross your legs. This may decrease the blood flow to your feet.  If you find a minor scrape,  cut, or break in the skin on your feet, keep it and the skin around it clean and dry. These areas may be cleansed with mild soap and water. Do not cleanse the area with peroxide, alcohol, or iodine.  When you remove an adhesive bandage, be sure not to damage the skin around it.  If you have a wound, look at it several times a day to make sure it is healing.  Do not use heating pads or hot water bottles. They may burn your skin. If you have lost feeling in your feet or legs, you may not know it is happening until it is too late.  Make sure your health care provider performs a complete foot exam at least annually or more often if you have foot problems. Report any cuts, sores, or bruises to your health care provider immediately. SEEK MEDICAL CARE IF:   You have an injury that is not healing.  You have cuts or breaks in the skin.  You have an ingrown nail.  You notice redness on your legs or feet.  You feel burning or tingling in your legs or feet.  You have pain or cramps in your legs and feet.  Your legs or feet are numb.  Your feet always feel cold. SEEK IMMEDIATE MEDICAL CARE IF:   There is increasing redness,   swelling, or pain in or around a wound.  There is a red line that goes up your leg.  Pus is coming from a wound.  You develop a fever or as directed by your health care provider.  You notice a bad smell coming from an ulcer or wound.   This information is not intended to replace advice given to you by your health care provider. Make sure you discuss any questions you have with your health care provider.   Document Released: 12/20/1999 Document Revised: 08/24/2012 Document Reviewed: 05/31/2012 Elsevier Interactive Patient Education 2016 Elsevier Inc[employer]   Cellulitis Cellulitis is an infection of the skin and the tissue beneath it. The infected area is usually red and tender. Cellulitis occurs most often in the arms and lower legs.  CAUSES  Cellulitis is caused by  bacteria that enter the skin through cracks or cuts in the skin. The most common types of bacteria that cause cellulitis are staphylococci and streptococci. SIGNS AND SYMPTOMS   Redness and warmth.  Swelling.  Tenderness or pain.  Fever. DIAGNOSIS  Your health care provider can usually determine what is wrong based on a physical exam. Blood tests may also be done. TREATMENT  Treatment usually involves taking an antibiotic medicine. HOME CARE INSTRUCTIONS   Take your antibiotic medicine as directed by your health care provider. Finish the antibiotic even if you start to feel better.  Keep the infected arm or leg elevated to reduce swelling.  Apply a warm cloth to the affected area up to 4 times per day to relieve pain.  Take medicines only as directed by your health care provider.  Keep all follow-up visits as directed by your health care provider. SEEK MEDICAL CARE IF:   You notice red streaks coming from the infected area.  Your red area gets larger or turns dark in color.  Your bone or joint underneath the infected area becomes painful after the skin has healed.  Your infection returns in the same area or another area.  You notice a swollen bump in the infected area.  You develop new symptoms.  You have a fever. SEEK IMMEDIATE MEDICAL CARE IF:   You feel very sleepy.  You develop vomiting or diarrhea.  You have a general ill feeling (malaise) with muscle aches and pains.   This information is not intended to replace advice given to you by your health care provider. Make sure you discuss any questions you have with your health care provider.   Document Released: 10/01/2004 Document Revised: 09/12/2014 Document Reviewed: 03/09/2011 Elsevier Interactive Patient Education [employer].

## 2015-07-03 ENCOUNTER — Ambulatory Visit (INDEPENDENT_AMBULATORY_CARE_PROVIDER_SITE_OTHER): Payer: Self-pay | Admitting: Family Medicine

## 2015-07-03 ENCOUNTER — Encounter: Payer: Self-pay | Admitting: Family Medicine

## 2015-07-03 VITALS — BP 150/80 | HR 64 | Resp 18 | Ht 64.0 in | Wt 180.0 lb

## 2015-07-03 DIAGNOSIS — E11628 Type 2 diabetes mellitus with other skin complications: Secondary | ICD-10-CM

## 2015-07-03 DIAGNOSIS — E1159 Type 2 diabetes mellitus with other circulatory complications: Secondary | ICD-10-CM

## 2015-07-03 DIAGNOSIS — I10 Essential (primary) hypertension: Secondary | ICD-10-CM

## 2015-07-03 DIAGNOSIS — L089 Local infection of the skin and subcutaneous tissue, unspecified: Secondary | ICD-10-CM

## 2015-07-03 DIAGNOSIS — E1169 Type 2 diabetes mellitus with other specified complication: Secondary | ICD-10-CM

## 2015-07-03 LAB — GLUCOSE, POCT (MANUAL RESULT ENTRY): POC GLUCOSE: 204 mg/dL — AB (ref 70–99)

## 2015-07-03 MED ORDER — CEFTRIAXONE SODIUM 1 G IJ SOLR: 1.0000 g | Freq: Once | INTRAMUSCULAR | Status: DC

## 2015-07-03 MED ORDER — CEPHALEXIN 500 MG PO CAPS: 500.0000 mg | ORAL_CAPSULE | Freq: Four times a day (QID) | ORAL | Status: DC

## 2015-07-03 MED ORDER — METFORMIN HCL 1000 MG PO TABS: 1000.0000 mg | ORAL_TABLET | Freq: Two times a day (BID) | ORAL | Status: DC

## 2015-07-03 MED ORDER — CARVEDILOL 6.25 MG PO TABS: 3.1250 mg | ORAL_TABLET | Freq: Two times a day (BID) | ORAL | Status: DC

## 2015-07-03 MED ORDER — SULFAMETHOXAZOLE-TRIMETHOPRIM 800-160 MG PO TABS: 1.0000 | ORAL_TABLET | Freq: Two times a day (BID) | ORAL | Status: DC

## 2015-07-10 ENCOUNTER — Encounter: Payer: Self-pay | Admitting: Family Medicine

## 2015-07-10 NOTE — Progress Notes (Signed)
Subjective:    Patient ID: Rebekah Johnson, female    DOB: Apr 07, 1960, 55 y.o.   MRN: [id number]  HPI  The documentation below is being transcribed from written record by Dr. Carolann Littler.  See written record that will be scanned in for this date.   Dr Daralene Milch is transcriber.  Reason for visit:  1 day for foot infection.  Feeling much better today.  Did not need to take Tramadol since yesterday.  Decreased pain/swelling.  No fever or chills.    Pt. Out of BP and diabetes meds since 12/2014, but has lost > 25 # and monitors her BS's, <200 weekly.   Current outpatient prescriptions:  .  aspirin 81 MG EC tablet, Take 1 tablet by mouth daily. Reported on 07/02/2015, Disp: , Rfl:  .  Cholecalciferol (VITAMIN D-3) 1000 units CAPS, Take 1 capsule by mouth daily., Disp: , Rfl:  .  Omega-3 Fatty Acids (FISH OIL) 1000 MG CAPS, Take 1 capsule by mouth daily., Disp: , Rfl:  .  traMADol (ULTRAM) 50 MG tablet, Take 1 tablet (50 mg total) by mouth every 8 (eight) hours as needed for severe pain., Disp: 30 tablet, Rfl: 0 .  amLODipine (NORVASC) 10 MG tablet, Take 1 tablet by mouth daily. Reported on 07/03/2015, Disp: , Rfl:  .  atorvastatin (LIPITOR) 40 MG tablet, Take 1 tablet by mouth daily. Reported on 07/03/2015, Disp: , Rfl:  .  carvedilol (COREG) 6.25 MG tablet, Take 0.5 tablets (3.125 mg total) by mouth 2 (two) times daily with a meal. Reported on 07/03/2015, Disp: 30 tablet, Rfl: 2 .  cephALEXin (KEFLEX) 500 MG capsule, Take 1 capsule (500 mg total) by mouth 4 (four) times daily. Take every six hours., Disp: 30 capsule, Rfl: 0 .  conjugated estrogens (PREMARIN) vaginal cream, Place 0.5 Applicatorfuls vaginally 2 (two) times a week. Reported on 07/03/2015, Disp: , Rfl:  .  gabapentin (NEURONTIN) 300 MG capsule, Take 1 capsule (300 mg total) by mouth 3 (three) times daily. (Patient not taking: Reported on 07/02/2015), Disp: 90 capsule, Rfl: 0 .  gemfibrozil (LOPID) 600 MG tablet, Take 1 tablet  by mouth 2 (two) times daily. Reported on 07/03/2015, Disp: , Rfl:  .  glipiZIDE (GLIPIZIDE XL) 10 MG 24 hr tablet, Take 1 tablet by mouth 2 (two) times daily. Reported on 07/03/2015, Disp: , Rfl:  .  metFORMIN (GLUCOPHAGE) 1000 MG tablet, Take 1 tablet (1,000 mg total) by mouth 2 (two) times daily with a meal., Disp: 180 tablet, Rfl: 3 .  sitaGLIPtin-metformin (JANUMET) 50-1000 MG tablet, Take 1 tablet by mouth 2 (two) times daily with a meal. Reported on 07/03/2015, Disp: , Rfl:  .  sulfamethoxazole-trimethoprim (BACTRIM DS,SEPTRA DS) 800-160 MG tablet, Take 1 tablet by mouth 2 (two) times daily., Disp: 20 tablet, Rfl: 0   No Known Allergies  Denies sulfa allergy      Review of Systems     Objective:   Physical Exam NAD, Happy  Tender, mildly swollen right great  + 2nd toe.  No drainage or fluctuance.  Swelling + redness tenderness decreased since yesterday per pt.  HEENT:  Moist MM's Lungs:  Clear CV:  RRR , no murmur Extremities:  Trace/1+ edema       Assessment & Plan:  1.  Diabetic Foot Infection:  Rocephin 1 gram IM today. Responding well to Rocephin alone, so doubt need coverage for pseudomonas.   Pt. Wishes to switch to oral:  Cephalexin 500 mg every 6 hours #  30 + Bactrim DS BID #20 Clindamycin dced as too expensive  2.  Diabetes:  Start Metformin 1000 BID  #60/2 RF  3.  Htn:  Start Coreg 6.25 mg 1/2 BID  #30/2RF  Followup Recheck with Dr. Amil Amen in 5 days (Monday, July 3rd) Pt. Getting orange card, so at follow up can add on Amlodipine, Lipitor, and other meds as indicated.

## 2015-07-12 ENCOUNTER — Encounter (HOSPITAL_BASED_OUTPATIENT_CLINIC_OR_DEPARTMENT_OTHER): Payer: Self-pay

## 2015-07-12 ENCOUNTER — Inpatient Hospital Stay (HOSPITAL_BASED_OUTPATIENT_CLINIC_OR_DEPARTMENT_OTHER)
Admission: EM | Admit: 2015-07-12 | Discharge: 2015-07-14 | DRG: 638 | Disposition: A | Discharge status: Home / Self Care | Payer: PRIVATE HEALTH INSURANCE | Attending: Internal Medicine | Admitting: Internal Medicine

## 2015-07-12 ENCOUNTER — Emergency Department (HOSPITAL_BASED_OUTPATIENT_CLINIC_OR_DEPARTMENT_OTHER): Payer: Self-pay

## 2015-07-12 DIAGNOSIS — F17210 Nicotine dependence, cigarettes, uncomplicated: Secondary | ICD-10-CM | POA: Diagnosis present

## 2015-07-12 DIAGNOSIS — L0390 Cellulitis, unspecified: Secondary | ICD-10-CM

## 2015-07-12 DIAGNOSIS — E1151 Type 2 diabetes mellitus with diabetic peripheral angiopathy without gangrene: Secondary | ICD-10-CM | POA: Diagnosis present

## 2015-07-12 DIAGNOSIS — L03032 Cellulitis of left toe: Secondary | ICD-10-CM

## 2015-07-12 DIAGNOSIS — L97509 Non-pressure chronic ulcer of other part of unspecified foot with unspecified severity: Secondary | ICD-10-CM

## 2015-07-12 DIAGNOSIS — L97529 Non-pressure chronic ulcer of other part of left foot with unspecified severity: Secondary | ICD-10-CM

## 2015-07-12 DIAGNOSIS — N189 Chronic kidney disease, unspecified: Secondary | ICD-10-CM | POA: Diagnosis present

## 2015-07-12 DIAGNOSIS — M79605 Pain in left leg: Secondary | ICD-10-CM

## 2015-07-12 DIAGNOSIS — I10 Essential (primary) hypertension: Secondary | ICD-10-CM | POA: Diagnosis not present

## 2015-07-12 DIAGNOSIS — E11621 Type 2 diabetes mellitus with foot ulcer: Secondary | ICD-10-CM | POA: Diagnosis not present

## 2015-07-12 DIAGNOSIS — E1165 Type 2 diabetes mellitus with hyperglycemia: Secondary | ICD-10-CM | POA: Diagnosis present

## 2015-07-12 DIAGNOSIS — L03116 Cellulitis of left lower limb: Secondary | ICD-10-CM | POA: Diagnosis present

## 2015-07-12 DIAGNOSIS — Z833 Family history of diabetes mellitus: Secondary | ICD-10-CM

## 2015-07-12 DIAGNOSIS — E785 Hyperlipidemia, unspecified: Secondary | ICD-10-CM | POA: Diagnosis present

## 2015-07-12 DIAGNOSIS — E7800 Pure hypercholesterolemia, unspecified: Secondary | ICD-10-CM | POA: Diagnosis present

## 2015-07-12 DIAGNOSIS — Z8249 Family history of ischemic heart disease and other diseases of the circulatory system: Secondary | ICD-10-CM

## 2015-07-12 DIAGNOSIS — N179 Acute kidney failure, unspecified: Secondary | ICD-10-CM | POA: Diagnosis not present

## 2015-07-12 DIAGNOSIS — Z7984 Long term (current) use of oral hypoglycemic drugs: Secondary | ICD-10-CM

## 2015-07-12 HISTORY — DX: Cellulitis, unspecified: L03.90

## 2015-07-12 LAB — CBC WITH DIFFERENTIAL/PLATELET
BASOS ABS: 0 10*3/uL (ref 0.0–0.1)
BASOS PCT: 0 %
EOS ABS: 0.2 10*3/uL (ref 0.0–0.7)
Eosinophils Relative: 3 %
HCT: 39.7 % (ref 36.0–46.0)
Hemoglobin: 13.3 g/dL (ref 12.0–15.0)
Lymphocytes Relative: 21 %
Lymphs Abs: 1.7 10*3/uL (ref 0.7–4.0)
MCH: 27.2 pg (ref 26.0–34.0)
MCHC: 33.5 g/dL (ref 30.0–36.0)
MCV: 81.2 fL (ref 78.0–100.0)
MONO ABS: 0.7 10*3/uL (ref 0.1–1.0)
Monocytes Relative: 9 %
NEUTROS PCT: 67 %
Neutro Abs: 5.3 10*3/uL (ref 1.7–7.7)
PLATELETS: 246 10*3/uL (ref 150–400)
RBC: 4.89 MIL/uL (ref 3.87–5.11)
RDW: 14 % (ref 11.5–15.5)
WBC: 7.9 10*3/uL (ref 4.0–10.5)

## 2015-07-12 LAB — BASIC METABOLIC PANEL
Anion gap: 12 (ref 5–15)
BUN: 21 mg/dL — AB (ref 6–20)
CO2: 22 mmol/L (ref 22–32)
Calcium: 9.5 mg/dL (ref 8.9–10.3)
Chloride: 99 mmol/L — ABNORMAL LOW (ref 101–111)
Creatinine, Ser: 1.49 mg/dL — ABNORMAL HIGH (ref 0.44–1.00)
GFR calc Af Amer: 44 mL/min — ABNORMAL LOW (ref >60)
GFR, EST NON AFRICAN AMERICAN: 38 mL/min — AB (ref >60)
Glucose, Bld: 193 mg/dL — ABNORMAL HIGH (ref 65–99)
POTASSIUM: 4 mmol/L (ref 3.5–5.1)
SODIUM: 133 mmol/L — AB (ref 135–145)

## 2015-07-12 LAB — CBG MONITORING, ED
GLUCOSE-CAPILLARY: 99 mg/dL (ref 65–99)
Glucose-Capillary: 219 mg/dL — ABNORMAL HIGH (ref 65–99)

## 2015-07-12 LAB — GLUCOSE, CAPILLARY: Glucose-Capillary: 102 mg/dL — ABNORMAL HIGH (ref 65–99)

## 2015-07-12 MED ORDER — SODIUM CHLORIDE 0.9 % IV BOLUS (SEPSIS)
1000.0000 mL | Freq: Once | INTRAVENOUS | Status: AC
  Administered 2015-07-12: 1000 mL via INTRAVENOUS

## 2015-07-12 MED ORDER — VANCOMYCIN HCL 10 G IV SOLR
1500.0000 mg | INTRAVENOUS | Status: DC
  Administered 2015-07-13: 1500 mg via INTRAVENOUS
  Filled 2015-07-12 (×3): qty 1500

## 2015-07-12 MED ORDER — VANCOMYCIN HCL 500 MG IV SOLR
INTRAVENOUS | Status: AC
  Filled 2015-07-12: qty 1500

## 2015-07-12 MED ORDER — PIPERACILLIN-TAZOBACTAM 3.375 G IVPB
3.3750 g | Freq: Once | INTRAVENOUS | Status: AC
  Administered 2015-07-13: 3.375 g via INTRAVENOUS
  Filled 2015-07-12: qty 50

## 2015-07-12 MED ORDER — PIPERACILLIN-TAZOBACTAM 3.375 G IVPB 30 MIN
3.3750 g | Freq: Once | INTRAVENOUS | Status: DC
  Filled 2015-07-12: qty 50

## 2015-07-12 MED ORDER — VANCOMYCIN HCL IN DEXTROSE 1-5 GM/200ML-% IV SOLN
1000.0000 mg | Freq: Once | INTRAVENOUS | Status: AC
  Administered 2015-07-12: 1000 mg via INTRAVENOUS
  Filled 2015-07-12: qty 200

## 2015-07-12 MED ORDER — OXYCODONE-ACETAMINOPHEN 5-325 MG PO TABS
2.0000 | ORAL_TABLET | Freq: Once | ORAL | Status: AC
  Administered 2015-07-12: 2 via ORAL
  Filled 2015-07-12: qty 2

## 2015-07-12 MED ORDER — PIPERACILLIN-TAZOBACTAM 3.375 G IVPB: 3.3750 g | Freq: Once | INTRAVENOUS | Status: DC

## 2015-07-12 MED ORDER — CLINDAMYCIN PHOSPHATE 600 MG/50ML IV SOLN
600.0000 mg | Freq: Once | INTRAVENOUS | Status: AC
  Administered 2015-07-12: 600 mg via INTRAVENOUS
  Filled 2015-07-12: qty 50

## 2015-07-12 NOTE — ED Notes (Signed)
Patient transported to Ultrasound 

## 2015-07-12 NOTE — H&P (Addendum)
History and Physical    Rebekah Johnson CZY:[id number] DOB: 14-Aug-1960 DOA: 07/12/2015  Referring MD/NP/PA: Dr. Dreama Saa PCP: No PCP Per Patient  Patient coming from: Home  Chief Complaint: Toe pain and swelling  HPI: Rebekah Johnson is a 55 y.o. female with medical history significant of HTN, HLD, DM2; who presents with left great toe and second toe pain and swelling. Symptoms have been gradually worsening over the last month. She does not recall having any trauma or injury to the foot. In the ED 2 weeks ago (6/24) with similar complaints and had x-ray imaging which showed soft tissue swelling but no acute fracture. Referred to podiatry for possible diabetic foot infection. Patient was evaluated by podiatry and they started her on tramadol for pain as well as gave her a 10 day course of Keflex and Bactrim. Patient reports taking medications as advised reports that she only has about 5 or so pills left of these prescriptions. She notes that swelling may help went down some since being on the antibiotics, but she still having significant pain. Tried using the tramadol with 2 extra strength Tylenol without relief of symptoms. She had some initial nausea while beginning the antibiotics that self resolved. She reports that while on the antibiotics she has tried to keep  herself well hydrated.  Denies any previous history of gout. There is been no redness/erythema or even warmth to the area affected since onset of symptoms. Associated symptoms include complaints of having numbness and tingling in her feet that does not appear to be new. Denies having any fever, chills, shortness breath,  vomiting, diarrhea, chest, or new rash.   ED Course:  Upon admission into the emergency department patient was evaluated and seen to be afebrile with blood pressure as high as 184/83, and all other vital signs within normal limits. Lab work revealed normal CBC, sodium 133, potassium 4, chloride 99, CO2 22, BUN 21, creatinine 1.49,  calcium 9.5, glucose 193. Venous duplex doppler ultrasound of  the left lower extremity was negative for any signs of clot. Patient transferred from [facility] to [place] bed at [facility].  Review of Systems: As per HPI otherwise 10 point review of systems negative.   Past Medical History  Diagnosis Date  . Diabetes mellitus without complication ([place])   . Hypertension   . High cholesterol     Past Surgical History  Procedure Laterality Date  . Abdominal hysterectomy       reports that she has been smoking Cigarettes.  She has been smoking about 1.00 pack per day. She has never used smokeless tobacco. She reports that she drinks alcohol. She reports that she does not use illicit drugs.  No Known Allergies  Family History  Problem Relation Age of Onset  . Diabetes Mother   . Hypertension Father     Prior to Admission medications   Medication Sig Start Date End Date Taking? Authorizing Provider  amLODipine (NORVASC) 10 MG tablet Take 1 tablet by mouth daily. Reported on 07/03/2015 11/13/14 11/13/15  Barrie Lyme, FNP  aspirin 81 MG EC tablet Take 1 tablet by mouth daily. Reported on 07/02/2015    Historical Provider, MD  atorvastatin (LIPITOR) 40 MG tablet Take 1 tablet by mouth daily. Reported on 07/03/2015 11/13/14   Barrie Lyme, FNP  carvedilol (COREG) 6.25 MG tablet Take 0.5 tablets (3.125 mg total) by mouth 2 (two) times daily with a meal. Reported on 07/03/2015 07/03/15 07/02/16  Angelina Pih, MD  cephALEXin (KEFLEX) 500 MG  capsule Take 1 capsule (500 mg total) by mouth 4 (four) times daily. Take every six hours. 07/03/15   Angelina Pih, MD  Cholecalciferol (VITAMIN D-3) 1000 units CAPS Take 1 capsule by mouth daily.    Historical Provider, MD  conjugated estrogens (PREMARIN) vaginal cream Place 0.5 Applicatorfuls vaginally 2 (two) times a week. Reported on 07/03/2015 08/20/14   Barrie Lyme, FNP  gabapentin (NEURONTIN) 300 MG capsule Take 1 capsule (300 mg total) by mouth 3  (three) times daily. Patient not taking: Reported on 07/02/2015 06/29/15   Dalia Heading, PA-C  gemfibrozil (LOPID) 600 MG tablet Take 1 tablet by mouth 2 (two) times daily. Reported on 07/03/2015 11/13/14 11/13/15  Barrie Lyme, FNP  glipiZIDE (GLIPIZIDE XL) 10 MG 24 hr tablet Take 1 tablet by mouth 2 (two) times daily. Reported on 07/03/2015 11/13/14   Historical Provider, MD  metFORMIN (GLUCOPHAGE) 1000 MG tablet Take 1 tablet (1,000 mg total) by mouth 2 (two) times daily with a meal. 07/03/15   Angelina Pih, MD  Omega-3 Fatty Acids (FISH OIL) 1000 MG CAPS Take 1 capsule by mouth daily.    Historical Provider, MD  sitaGLIPtin-metformin (JANUMET) 50-1000 MG tablet Take 1 tablet by mouth 2 (two) times daily with a meal. Reported on 07/03/2015 11/13/14   Barrie Lyme, FNP  sulfamethoxazole-trimethoprim (BACTRIM DS,SEPTRA DS) 800-160 MG tablet Take 1 tablet by mouth 2 (two) times daily. 07/03/15   Angelina Pih, MD  traMADol (ULTRAM) 50 MG tablet Take 1 tablet (50 mg total) by mouth every 8 (eight) hours as needed for severe pain. 07/02/15   Thao Susanne Borders, DO    Physical Exam:  Constitutional: Older female who is in NAD, calm, comfortable Filed Vitals:   07/12/15 2100 07/12/15 2130 07/12/15 2141 07/12/15 2251  BP: 149/64 164/73 149/76 184/83  Pulse: 56 51 58 58  Temp:   98.2 F (36.8 C) 97.8 F (36.6 C)  TempSrc:      Resp: '16  20 18  ' Height:    '5\' 4"'  (1.626 m)  Weight:    83.598 kg (184 lb 4.8 oz)  SpO2: 97% 98% 100% 100%   Eyes: PERRL, lids and conjunctivae normal ENMT: Mucous membranes are moist. Posterior pharynx clear of any exudate or lesions. Neck: normal, supple, no masses, no thyromegaly Respiratory: clear to auscultation bilaterally, no wheezing, no crackles. Normal respiratory effort. No accessory muscle use.  Cardiovascular: Regular rate and rhythm, no murmurs / rubs / gallops. No extremity edema. 1+ pedal pulses. No carotid bruits.  Abdomen: no tenderness, no masses  palpated. No hepatosplenomegaly. Bowel sounds positive.  Musculoskeletal: no clubbing / cyanosis. Swelling of the left first and second digit and no redness or erythema noted. Skin: Patient has ulcerations seen of the left first and second toes no drainage or discharge seen. Neurologic: CN 2-12 grossly intact. Sensation intact, DTR normal. Strength 5/5 in all 4.  Psychiatric: Normal judgment and insight. Alert and oriented x 3. Normal mood.     Labs on Admission: I have personally reviewed following labs and imaging studies  CBC:  Recent Labs Lab 07/12/15 1840  WBC 7.9  NEUTROABS 5.3  HGB 13.3  HCT 39.7  MCV 81.2  PLT 580   Basic Metabolic Panel:  Recent Labs Lab 07/12/15 1840  NA 133*  K 4.0  CL 99*  CO2 22  GLUCOSE 193*  BUN 21*  CREATININE 1.49*  CALCIUM 9.5   GFR: Estimated Creatinine Clearance: 43.6 mL/min (by C-G formula based on  Cr of 1.49). Liver Function Tests: No results for input(s): AST, ALT, ALKPHOS, BILITOT, PROT, ALBUMIN in the last 168 hours. No results for input(s): LIPASE, AMYLASE in the last 168 hours. No results for input(s): AMMONIA in the last 168 hours. Coagulation Profile: No results for input(s): INR, PROTIME in the last 168 hours. Cardiac Enzymes: No results for input(s): CKTOTAL, CKMB, CKMBINDEX, TROPONINI in the last 168 hours. BNP (last 3 results) No results for input(s): PROBNP in the last 8760 hours. HbA1C: No results for input(s): HGBA1C in the last 72 hours. CBG:  Recent Labs Lab 07/12/15 1832 07/12/15 2125 07/12/15 2241  GLUCAP 219* 99 102*   Lipid Profile: No results for input(s): CHOL, HDL, LDLCALC, TRIG, CHOLHDL, LDLDIRECT in the last 72 hours. Thyroid Function Tests: No results for input(s): TSH, T4TOTAL, FREET4, T3FREE, THYROIDAB in the last 72 hours. Anemia Panel: No results for input(s): VITAMINB12, FOLATE, FERRITIN, TIBC, IRON, RETICCTPCT in the last 72 hours. Urine analysis: No results found for: COLORURINE,  APPEARANCEUR, LABSPEC, PHURINE, GLUCOSEU, HGBUR, BILIRUBINUR, KETONESUR, PROTEINUR, UROBILINOGEN, NITRITE, LEUKOCYTESUR Sepsis Labs: No results found for this or any previous visit (from the past 240 hour(s)).   Radiological Exams on Admission: US Venous Img Lower Unilateral Left  07/12/2015  CLINICAL DATA:  LEFT foot swelling for 1 month.  Cellulitis. EXAM: LEFT LOWER EXTREMITY VENOUS DOPPLER ULTRASOUND TECHNIQUE: Gray-scale sonography with graded compression, as well as color Doppler and duplex ultrasound were performed to evaluate the lower extremity deep venous systems from the level of the common femoral vein and including the common femoral, femoral, profunda femoral, popliteal and calf veins including the posterior tibial, peroneal and gastrocnemius veins when visible. The superficial great saphenous vein was also interrogated. Spectral Doppler was utilized to evaluate flow at rest and with distal augmentation maneuvers in the common femoral, femoral and popliteal veins. COMPARISON:  None. FINDINGS: Contralateral Common Femoral Vein: Respiratory phasicity is normal and symmetric with the symptomatic side. No evidence of thrombus. Normal compressibility. Common Femoral Vein: No evidence of thrombus. Normal compressibility, respiratory phasicity and response to augmentation. Saphenofemoral Junction: No evidence of thrombus. Normal compressibility and flow on color Doppler imaging. Profunda Femoral Vein: No evidence of thrombus. Normal compressibility and flow on color Doppler imaging. Femoral Vein: No evidence of thrombus. Normal compressibility, respiratory phasicity and response to augmentation. Popliteal Vein: No evidence of thrombus. Normal compressibility, respiratory phasicity and response to augmentation. Calf Veins: No evidence of thrombus. Normal compressibility and flow on color Doppler imaging. Superficial Great Saphenous Vein: No evidence of thrombus. Normal compressibility and flow on color  Doppler imaging. IMPRESSION: No evidence of LEFT lower extremity deep venous thrombosis. Electronically Signed   By: Suzy Bouchard M.D.   On: 07/12/2015 19:55      Assessment/Plan Diabetic foot ulcer question cellulitis versus noninfectious inflammatory process: Acute. Patient has been dealing with symptoms of pain and swelling of the right foot over the last month. No significant warmth or erythema noted. Evaluation with lower extremity duplex Doppler show no signs of a clot. - Admit to a MedSurg bed - f/u bld cultures - Check ESR/ CRP/ uric acid level - Question need of possible TBI as good symptoms be secondary to poor distal vascular supply. - Empiric antibiotics of vancomycin and Zosyn  for now  Acute kidney injury: Patient's baseline creatinine noted to be previously 1.14 on 06/29/2015. Patient received 1 L IV fluids in the ED. Question possibility of dehydration versus acute kidney injury secondary to antibiotics. - Check FeNa -  IV fluids normal saline at 100 mL per hour - Recheck BMP in a.m.   Essential hypertension - Continue Coreg and amlodipine  Diabetes mellitus type 2: Patient's initial blood glucose noted to be 193 on admission. Held glipizide and metformin  - Held glipizide, Janumet, and metformin - Hypoglycemic protocols - CBGs every before meals and at bedtime with sensitive sliding scale insulin for now  Hyperlipidemia - Continue lopid and atorvastatin  DVT prophylaxis: lovenox Code Status: full Family Communication:  No family present at bedside Disposition Plan: Possible discharge in 2-3 days Consults called:  None on status: MedSurg observation   Norval Morton MD [facility] Pager [phone]  If 7PM-7AM, please contact night-coverage [email or web address] Password [email or web address]  07/12/2015, 11:55 PM

## 2015-07-12 NOTE — ED Notes (Signed)
Attempted to call report x1 to 5W and RN was unavailable. Call back number left so RN can call for report.

## 2015-07-12 NOTE — ED Notes (Signed)
L foot swelling and pain x 1 month. Pt currently being treated for cellulitis in L foot. Pt states she has missed a "few" doses of her antibiotics, but is still taking antibiotics.

## 2015-07-12 NOTE — ED Provider Notes (Signed)
Patient with swollen painful left foot and distal left calf onset approximately 2 weeks ago. Treated with Bactrim and with Keflex, without relief. She admits to intermittent nausea though none now. Denies any fever. No other associated symptoms. Last tetanus shot less than 10 years ago. On exam alert nontoxic left lower extremity reddened and tender the distal third of calf and over dorsum of foot.dp pulse obtainable with doppler  Orlie Dakin, MD 07/12/15 1919

## 2015-07-12 NOTE — ED Provider Notes (Signed)
CSN: [id number]     Arrival date & time 07/12/15  1744 History  By signing my name below, I, Rebekah Johnson, attest that this documentation has been prepared under the direction and in the presence of [employer], PA-C.  Electronically Signed: Eustaquio Johnson, ED Scribe. 07/12/2015. 6:17 PM.   Chief Complaint  Patient presents with  . Foot Pain   The history is provided by the patient. No language interpreter was used.     HPI Comments: Rebekah Johnson is a 55 y.o. female with PMHx DM, HTN, and HLD who presents to the Emergency Department complaining of gradual onset, constant, left great toe and left 2nd toe pain x 1 month, gradually worsening. Pt also complains of swelling to the area. No known injury to the foot. Pt was seen in ED on 06/29/2015 (approximately 2 weeks ago) for the same symptoms. She had basic blood work done and a DG L Foot with findings of soft tissue swelling with no acute fracture noted. Pt was diagnosed with diabetic wound and referred to podiatry for follow up. While at the podiatrist office she was given prescriptions for 10 day course Keflex, Tramadol, and 10 day course Bactrim without relief. Pt has 5 pills left of both Keflex and Bactrim. She has also been taking Tylenol without relief. Pt states that the swelling has gradually improved since being on the antibiotics but that the pain is still unchanged, prompting her to come to the ED today. No hx gout. Pt has hx of PAD and mentions numbness to the feet that has not changed. No recent prolonged travel. No hx DVT/PE. Pt has been on hormone replacement therapy in the past but has not been on her estrogen for the past 6 months. She is not on any anticoagulants. Denies weakness, tingling, fever, nausea, vomiting, or any other associated symptoms. Pt states that her blood glucose has been running from 130-170.    Past Medical History  Diagnosis Date  . Diabetes mellitus without complication ([place])   . Hypertension   . High  cholesterol    Past Surgical History  Procedure Laterality Date  . Abdominal hysterectomy     Family History  Problem Relation Age of Onset  . Diabetes Mother   . Hypertension Father    Social History  Substance Use Topics  . Smoking status: Current Every Day Smoker -- 1.00 packs/day    Types: Cigarettes  . Smokeless tobacco: Never Used  . Alcohol Use: 0.0 oz/week    0 Standard drinks or equivalent per week     Comment: on social occassions   OB History    No data available     Review of Systems A complete 10 system review of systems was obtained and all systems are negative except as noted in the HPI and PMH.   Allergies  Review of patient's allergies indicates no known allergies.  Home Medications   Prior to Admission medications   Medication Sig Start Date End Date Taking? Authorizing Provider  amLODipine (NORVASC) 10 MG tablet Take 1 tablet by mouth daily. Reported on 07/03/2015 11/13/14 11/13/15  Barrie Lyme, FNP  aspirin 81 MG EC tablet Take 1 tablet by mouth daily. Reported on 07/02/2015    Historical Provider, MD  atorvastatin (LIPITOR) 40 MG tablet Take 1 tablet by mouth daily. Reported on 07/03/2015 11/13/14   Barrie Lyme, FNP  carvedilol (COREG) 6.25 MG tablet Take 0.5 tablets (3.125 mg total) by mouth 2 (two) times daily with a meal.  Reported on 07/03/2015 07/03/15 07/02/16  Angelina Pih, MD  cephALEXin (KEFLEX) 500 MG capsule Take 1 capsule (500 mg total) by mouth 4 (four) times daily. Take every six hours. 07/03/15   Angelina Pih, MD  Cholecalciferol (VITAMIN D-3) 1000 units CAPS Take 1 capsule by mouth daily.    Historical Provider, MD  conjugated estrogens (PREMARIN) vaginal cream Place 0.5 Applicatorfuls vaginally 2 (two) times a week. Reported on 07/03/2015 08/20/14   Barrie Lyme, FNP  gabapentin (NEURONTIN) 300 MG capsule Take 1 capsule (300 mg total) by mouth 3 (three) times daily. Patient not taking: Reported on 07/02/2015 06/29/15   Dalia Heading, PA-C  gemfibrozil (LOPID) 600 MG tablet Take 1 tablet by mouth 2 (two) times daily. Reported on 07/03/2015 11/13/14 11/13/15  Barrie Lyme, FNP  glipiZIDE (GLIPIZIDE XL) 10 MG 24 hr tablet Take 1 tablet by mouth 2 (two) times daily. Reported on 07/03/2015 11/13/14   Historical Provider, MD  metFORMIN (GLUCOPHAGE) 1000 MG tablet Take 1 tablet (1,000 mg total) by mouth 2 (two) times daily with a meal. 07/03/15   Angelina Pih, MD  Omega-3 Fatty Acids (FISH OIL) 1000 MG CAPS Take 1 capsule by mouth daily.    Historical Provider, MD  sitaGLIPtin-metformin (JANUMET) 50-1000 MG tablet Take 1 tablet by mouth 2 (two) times daily with a meal. Reported on 07/03/2015 11/13/14   Barrie Lyme, FNP  sulfamethoxazole-trimethoprim (BACTRIM DS,SEPTRA DS) 800-160 MG tablet Take 1 tablet by mouth 2 (two) times daily. 07/03/15   Angelina Pih, MD  traMADol (ULTRAM) 50 MG tablet Take 1 tablet (50 mg total) by mouth every 8 (eight) hours as needed for severe pain. 07/02/15   Thao P Le, DO   BP 180/79 mmHg  Pulse 72  Temp(Src) 98.2 F (36.8 C) (Oral)  Resp 18  Ht 5\' 4"  (1.626 m)  Wt 180 lb (81.647 kg)  BMI 30.88 kg/m2  SpO2 98%   Physical Exam  Constitutional: She is oriented to person, place, and time. She appears well-developed and well-nourished. No distress.  HENT:  Head: Normocephalic and atraumatic.  Eyes: Conjunctivae and EOM are normal.  Neck: Normal range of motion. Neck supple. No tracheal deviation present.  Cardiovascular: Normal rate.   Pulmonary/Chest: Effort normal. No respiratory distress.  Musculoskeletal: She exhibits edema.  Diffuse swelling of the dorsal aspect of the left foot and left ankle with erythema; not warm to the touch. Pulse found on DP and PT of RLE and LLE with doppler.  Neurological: She is alert and oriented to person, place, and time.  Skin: Skin is warm and dry. There is erythema.  Psychiatric: She has a normal mood and affect. Her behavior is normal.  Nursing  note and vitals reviewed.   ED Course  Procedures (including critical care time)  DIAGNOSTIC STUDIES: Oxygen Saturation is 98% on RA, normal by my interpretation.    COORDINATION OF CARE: 6:07 PM-Discussed treatment plan which includes consult with attending physician with pt at bedside and pt agreed to plan.   Labs Review Labs Reviewed - No data to display  Imaging Review No results found. I have personally reviewed and evaluated these images and lab results as part of my medical decision-making.   EKG Interpretation None      MDM   Final diagnoses:  None  Patient with a history of DM presents today with swelling and erythema of the LLE that has been present for the past couple of weeks.  She was seen in the  ED on 06/29/15 and then also seen by PCP.  She was started on Bactrim and Keflex on 07/03/15.  However, she feels that symptoms have worsened in spite of taking the Bactrim and Keflex.  Doppler ultrasound negative for DVT.  Labs unremarkable aside from hyperglycemia and elevated Creatine.  Due to the fact that the patient has failed outpatient antibiotic therapy, the patient admitted to [facility] for IV antibiotics and further management of Cellulitis.    I personally performed the services described in this documentation, which was scribed in my presence. The recorded information has been reviewed and is accurate.      Hyman Bible, PA-C 07/13/15 0100  Orlie Dakin, MD 07/13/15 1459

## 2015-07-12 NOTE — Progress Notes (Signed)
Patient has had cellulitis , failed outpatient treatment. She is diabetic. She meets criteria for inpatient treatment with IV abx. She will be in Medsurg bed, on IV vancomcyin/IV zosyn. Vitals stable. Labs with AKI.

## 2015-07-12 NOTE — Progress Notes (Signed)
Pharmacy Antibiotic Note  Rebekah Johnson is a 55 y.o. female admitted on 07/12/2015 with cellulitis.  Pharmacy has been consulted for vancomycin dosing. Pt is afebrile and WBC is WNL. SCr elevated at 1.49.   Plan: - Vancomycin 1gm IV x 1 (in pyxis) then 1500mg  IV Q24H starting tomorrow - F/u renal fxn, C&S, clinical status and trough at SS  Height: 5\' 4"  (162.6 cm) Weight: 180 lb (81.647 kg) IBW/kg (Calculated) : 54.7  Temp (24hrs), Avg:98.2 F (36.8 C), Min:98.2 F (36.8 C), Max:98.2 F (36.8 C)   Recent Labs Lab 07/12/15 1840  WBC 7.9  CREATININE 1.49*    Estimated Creatinine Clearance: 43.1 mL/min (by C-G formula based on Cr of 1.49).    No Known Allergies  Antimicrobials this admission: Vanc 7/7>> Zosyn x 1 7/7  Dose adjustments this admission: N/A  Microbiology results: Pending  Thank you for allowing pharmacy to be a part of this patient's care.  Rebekah Johnson, Rebekah Johnson 07/12/2015 9:04 PM

## 2015-07-12 NOTE — ED Notes (Signed)
PA-C at bedside 

## 2015-07-13 DIAGNOSIS — E11621 Type 2 diabetes mellitus with foot ulcer: Secondary | ICD-10-CM

## 2015-07-13 DIAGNOSIS — E1151 Type 2 diabetes mellitus with diabetic peripheral angiopathy without gangrene: Secondary | ICD-10-CM | POA: Diagnosis not present

## 2015-07-13 DIAGNOSIS — N179 Acute kidney failure, unspecified: Secondary | ICD-10-CM | POA: Diagnosis present

## 2015-07-13 DIAGNOSIS — I10 Essential (primary) hypertension: Secondary | ICD-10-CM | POA: Diagnosis not present

## 2015-07-13 DIAGNOSIS — L97509 Non-pressure chronic ulcer of other part of unspecified foot with unspecified severity: Secondary | ICD-10-CM

## 2015-07-13 DIAGNOSIS — L03119 Cellulitis of unspecified part of limb: Secondary | ICD-10-CM

## 2015-07-13 DIAGNOSIS — N189 Chronic kidney disease, unspecified: Secondary | ICD-10-CM

## 2015-07-13 HISTORY — DX: Type 2 diabetes mellitus with foot ulcer: E11.621

## 2015-07-13 HISTORY — DX: Type 2 diabetes mellitus with foot ulcer: L97.509

## 2015-07-13 LAB — CBC WITH DIFFERENTIAL/PLATELET
BASOS PCT: 2 %
Basophils Absolute: 0.2 10*3/uL — ABNORMAL HIGH (ref 0.0–0.1)
Eosinophils Absolute: 0.2 10*3/uL (ref 0.0–0.7)
Eosinophils Relative: 3 %
HEMATOCRIT: 37.1 % (ref 36.0–46.0)
HEMOGLOBIN: 12.1 g/dL (ref 12.0–15.0)
Lymphocytes Relative: 32 %
Lymphs Abs: 2.5 10*3/uL (ref 0.7–4.0)
MCH: 26.6 pg (ref 26.0–34.0)
MCHC: 32.6 g/dL (ref 30.0–36.0)
MCV: 81.5 fL (ref 78.0–100.0)
Monocytes Absolute: 0.8 10*3/uL (ref 0.1–1.0)
Monocytes Relative: 11 %
NEUTROS ABS: 4 10*3/uL (ref 1.7–7.7)
Neutrophils Relative %: 52 %
PLATELETS: 255 10*3/uL (ref 150–400)
RBC: 4.55 MIL/uL (ref 3.87–5.11)
RDW: 13.9 % (ref 11.5–15.5)
WBC: 7.7 10*3/uL (ref 4.0–10.5)

## 2015-07-13 LAB — GLUCOSE, CAPILLARY
GLUCOSE-CAPILLARY: 132 mg/dL — AB (ref 65–99)
Glucose-Capillary: 79 mg/dL (ref 65–99)
Glucose-Capillary: 94 mg/dL (ref 65–99)
Glucose-Capillary: 134 mg/dL — ABNORMAL HIGH (ref 65–99)
Glucose-Capillary: 165 mg/dL — ABNORMAL HIGH (ref 65–99)
Glucose-Capillary: 143 mg/dL — ABNORMAL HIGH (ref 65–99)

## 2015-07-13 LAB — SODIUM, URINE, RANDOM: Sodium, Ur: 74 mmol/L

## 2015-07-13 LAB — URINALYSIS, ROUTINE W REFLEX MICROSCOPIC
BILIRUBIN URINE: NEGATIVE
Glucose, UA: NEGATIVE mg/dL
HGB URINE DIPSTICK: NEGATIVE
Ketones, ur: NEGATIVE mg/dL
Leukocytes, UA: NEGATIVE
Nitrite: NEGATIVE
Protein, ur: NEGATIVE mg/dL
SPECIFIC GRAVITY, URINE: 1.009 (ref 1.005–1.030)
pH: 7 (ref 5.0–8.0)

## 2015-07-13 LAB — COMPREHENSIVE METABOLIC PANEL
ALBUMIN: 3.3 g/dL — AB (ref 3.5–5.0)
ALK PHOS: 81 U/L (ref 38–126)
ALT: 21 U/L (ref 14–54)
AST: 23 U/L (ref 15–41)
Anion gap: 5 (ref 5–15)
BILIRUBIN TOTAL: 0.4 mg/dL (ref 0.3–1.2)
BUN: 15 mg/dL (ref 6–20)
CALCIUM: 9.1 mg/dL (ref 8.9–10.3)
CO2: 25 mmol/L (ref 22–32)
CREATININE: 1.36 mg/dL — AB (ref 0.44–1.00)
Chloride: 106 mmol/L (ref 101–111)
GFR calc Af Amer: 49 mL/min — ABNORMAL LOW (ref >60)
GFR calc non Af Amer: 42 mL/min — ABNORMAL LOW (ref >60)
GLUCOSE: 58 mg/dL — AB (ref 65–99)
Potassium: 3.7 mmol/L (ref 3.5–5.1)
Sodium: 136 mmol/L (ref 135–145)
TOTAL PROTEIN: 6.8 g/dL (ref 6.5–8.1)

## 2015-07-13 LAB — SEDIMENTATION RATE: SED RATE: 57 mm/h — AB (ref 0–22)

## 2015-07-13 LAB — URIC ACID: Uric Acid, Serum: 5.7 mg/dL (ref 2.3–6.6)

## 2015-07-13 LAB — C-REACTIVE PROTEIN: CRP: 0.7 mg/dL (ref <1.0)

## 2015-07-13 LAB — CREATININE, URINE, RANDOM: Creatinine, Urine: 34.9 mg/dL

## 2015-07-13 MED ORDER — CARVEDILOL 3.125 MG PO TABS
3.1250 mg | ORAL_TABLET | Freq: Two times a day (BID) | ORAL | Status: DC
  Administered 2015-07-13 – 2015-07-14 (×3): 3.125 mg via ORAL
  Filled 2015-07-13 (×3): qty 1

## 2015-07-13 MED ORDER — SODIUM CHLORIDE 0.9 % IV SOLN
INTRAVENOUS | Status: DC
  Administered 2015-07-13 – 2015-07-14 (×4): via INTRAVENOUS

## 2015-07-13 MED ORDER — HYDRALAZINE HCL 20 MG/ML IJ SOLN
10.0000 mg | INTRAMUSCULAR | Status: DC | PRN
Start: 2015-07-13 — End: 2015-07-13

## 2015-07-13 MED ORDER — HYDROCODONE-ACETAMINOPHEN 5-325 MG PO TABS
1.0000 | ORAL_TABLET | Freq: Four times a day (QID) | ORAL | Status: DC | PRN
  Administered 2015-07-13 – 2015-07-14 (×3): 2 via ORAL
  Filled 2015-07-13 (×3): qty 2

## 2015-07-13 MED ORDER — ISOSORBIDE MONONITRATE ER 30 MG PO TB24
30.0000 mg | ORAL_TABLET | Freq: Every day | ORAL | Status: DC
  Administered 2015-07-13: 30 mg via ORAL
  Filled 2015-07-13: qty 1

## 2015-07-13 MED ORDER — VITAMIN D 1000 UNITS PO TABS
1000.0000 [IU] | ORAL_TABLET | Freq: Every day | ORAL | Status: DC
  Administered 2015-07-13 – 2015-07-14 (×2): 1000 [IU] via ORAL
  Filled 2015-07-13 (×2): qty 1

## 2015-07-13 MED ORDER — ENOXAPARIN SODIUM 40 MG/0.4ML ~~LOC~~ SOLN
40.0000 mg | Freq: Every day | SUBCUTANEOUS | Status: DC
  Filled 2015-07-13: qty 0.4

## 2015-07-13 MED ORDER — AMLODIPINE BESYLATE 10 MG PO TABS
10.0000 mg | ORAL_TABLET | Freq: Every day | ORAL | Status: DC
  Administered 2015-07-13 – 2015-07-14 (×2): 10 mg via ORAL
  Filled 2015-07-13 (×2): qty 1

## 2015-07-13 MED ORDER — ADULT MULTIVITAMIN W/MINERALS CH
1.0000 | ORAL_TABLET | Freq: Every day | ORAL | Status: DC
  Administered 2015-07-13 – 2015-07-14 (×2): 1 via ORAL
  Filled 2015-07-13 (×2): qty 1

## 2015-07-13 MED ORDER — ALBUTEROL SULFATE (2.5 MG/3ML) 0.083% IN NEBU: 2.5000 mg | INHALATION_SOLUTION | RESPIRATORY_TRACT | Status: DC | PRN

## 2015-07-13 MED ORDER — INSULIN ASPART 100 UNIT/ML ~~LOC~~ SOLN: 0.0000 [IU] | Freq: Three times a day (TID) | SUBCUTANEOUS | Status: DC

## 2015-07-13 MED ORDER — ONDANSETRON HCL 4 MG PO TABS: 4.0000 mg | ORAL_TABLET | Freq: Four times a day (QID) | ORAL | Status: DC | PRN

## 2015-07-13 MED ORDER — HYDRALAZINE HCL 20 MG/ML IJ SOLN
10.0000 mg | Freq: Once | INTRAMUSCULAR | Status: AC
  Administered 2015-07-13: 10 mg via INTRAVENOUS
  Filled 2015-07-13: qty 1

## 2015-07-13 MED ORDER — ASPIRIN EC 81 MG PO TBEC
81.0000 mg | DELAYED_RELEASE_TABLET | Freq: Every day | ORAL | Status: DC
  Administered 2015-07-13 – 2015-07-14 (×2): 81 mg via ORAL
  Filled 2015-07-13 (×2): qty 1

## 2015-07-13 MED ORDER — INSULIN ASPART 100 UNIT/ML ~~LOC~~ SOLN: 0.0000 [IU] | Freq: Every day | SUBCUTANEOUS | Status: DC

## 2015-07-13 MED ORDER — ATORVASTATIN CALCIUM 40 MG PO TABS
40.0000 mg | ORAL_TABLET | Freq: Every day | ORAL | Status: DC
  Administered 2015-07-13: 40 mg via ORAL
  Filled 2015-07-13: qty 1

## 2015-07-13 MED ORDER — JUVEN PO PACK
1.0000 | PACK | Freq: Two times a day (BID) | ORAL | Status: DC
  Administered 2015-07-13 – 2015-07-14 (×3): 1 via ORAL
  Filled 2015-07-13 (×3): qty 1

## 2015-07-13 MED ORDER — HYDRALAZINE HCL 20 MG/ML IJ SOLN
10.0000 mg | INTRAMUSCULAR | Status: DC | PRN
Start: 2015-07-13 — End: 2015-07-14
  Administered 2015-07-13 – 2015-07-14 (×3): 10 mg via INTRAVENOUS
  Filled 2015-07-13 (×3): qty 1

## 2015-07-13 MED ORDER — ACETAMINOPHEN 650 MG RE SUPP: 650.0000 mg | Freq: Four times a day (QID) | RECTAL | Status: DC | PRN

## 2015-07-13 MED ORDER — ACETAMINOPHEN 325 MG PO TABS: 650.0000 mg | ORAL_TABLET | Freq: Four times a day (QID) | ORAL | Status: DC | PRN

## 2015-07-13 MED ORDER — ONDANSETRON HCL 4 MG/2ML IJ SOLN: 4.0000 mg | Freq: Four times a day (QID) | INTRAMUSCULAR | Status: DC | PRN

## 2015-07-13 MED ORDER — PIPERACILLIN-TAZOBACTAM 3.375 G IVPB
3.3750 g | Freq: Three times a day (TID) | INTRAVENOUS | Status: DC
  Administered 2015-07-13 – 2015-07-14 (×5): 3.375 g via INTRAVENOUS
  Filled 2015-07-13 (×7): qty 50

## 2015-07-13 NOTE — Progress Notes (Signed)
PROGRESS NOTE    Rebekah Johnson  [id number] DOB: [birth year] DOA: 07/12/2015 PCP: No PCP Per Patient   Brief Narrative:  Rebekah Johnson is a 55 year old female with a past medical history of diabetes mellitus, hypertension, dyslipidemia, presented to the emergency department on 07/13/2015 with complaints of left great toe and second toe swelling. She reported symptoms progressively worsening over the past month. X-ray of her foot showed soft tissue swelling without acute bony abnormalities. She was started on empiric IV antibiotic therapy with Vancomycin and Zosyn.    Assessment & Plan:   Principal Problem:   Diabetic foot ulcer ([place]) Active Problems:   Essential (primary) hypertension   Type 2 diabetes mellitus with peripheral angiopathy ([place])   Cellulitis   AKI (acute kidney injury) ([place])   1.  Suspected foot cellulitis -Patient is a pleasant 56 year old with a history of diabetes mellitus, presenting with left great toe and second toe swelling, pain, erythema. On physical examination there is no purulence/discharge or fluctuant masses. X-ray did not show acute bony abnormalities. I did not see associated diabetic ulcer -Plan continue empiric IV antibiotic therapy with vancomycin and Zosyn -Showing clinical improvement -Blood cultures drawn on 07/13/2015 showing no growth to date  2.  Type 2 diabetes mellitus -Her blood sugars are well controlled -She is being covered with sliding scale coverage  3.  Hypertension. -Continue isosorbide mononitrate 30 mg by mouth daily, amlodipine 10 mg by mouth daily and Coreg 3.125 mg by mouth twice a day -Plan to continue monitoring blood pressures over the next 24 hours, may need titration of blood pressures remain elevated  DVT prophylaxis: Lovenox Code Status: Full code Family Communication:  Disposition Plan: Anticipate discharge in the next 24-48 hours  Consultants:     Procedures:     Antimicrobials:    Zosyn  Vancomycin   Subjective: She reports feeling a little better today, thinks her infection is getting better  Objective: Filed Vitals:   07/13/15 0536 07/13/15 0644 07/13/15 0732 07/13/15 1300  BP: 173/59 192/80 190/71 166/62  Pulse: 61 71 61 72  Temp: 98.9 F (37.2 C)   98.5 F (36.9 C)  TempSrc: Oral     Resp: 18   19  Height:      Weight:      SpO2: 100%   100%    Intake/Output Summary (Last 24 hours) at 07/13/15 1545 Last data filed at 07/13/15 1354  Gross per 24 hour  Intake 1818.33 ml  Output   2050 ml  Net -231.67 ml   Filed Weights   07/12/15 1759 07/12/15 2251  Weight: 81.647 kg (180 lb) 83.598 kg (184 lb 4.8 oz)    Examination:  General exam: Appears calm and comfortable, Nontoxic-appearing Respiratory system: Clear to auscultation. Respiratory effort normal. Cardiovascular system: S1 & S2 heard, RRR. No JVD, murmurs, rubs, gallops or clicks. No pedal edema. Gastrointestinal system: Abdomen is nondistended, soft and nontender. No organomegaly or masses felt. Normal bowel sounds heard. Central nervous system: Alert and oriented. No focal neurological deficits. Extremities: Symmetric 5 x 5 power. Skin: There is erythema and swelling involving the great toe and second toe of her left foot, I did not appreciate purulence or discharge. She was able to flex and extend both toes without significant pain. No associated foot ulcer noted Psychiatry: Judgement and insight appear normal. Mood & affect appropriate.     Data Reviewed: I have personally reviewed following labs and imaging studies  CBC:  Recent Labs Lab 07/12/15 1840 07/13/15  0237  WBC 7.9 7.7  NEUTROABS 5.3 4.0  HGB 13.3 12.1  HCT 39.7 37.1  MCV 81.2 81.5  PLT 246 [id number]   Basic Metabolic Panel:  Recent Labs Lab 07/12/15 1840 07/13/15 0237  NA 133* 136  K 4.0 3.7  CL 99* 106  CO2 22 25  GLUCOSE 193* 58*  BUN 21* 15  CREATININE 1.49* 1.36*  CALCIUM 9.5 9.1    GFR: Estimated Creatinine Clearance: 47.8 mL/min (by C-G formula based on Cr of 1.36). Liver Function Tests:  Recent Labs Lab 07/13/15 0237  AST 23  ALT 21  ALKPHOS 81  BILITOT 0.4  PROT 6.8  ALBUMIN 3.3*   No results for input(s): LIPASE, AMYLASE in the last 168 hours. No results for input(s): AMMONIA in the last 168 hours. Coagulation Profile: No results for input(s): INR, PROTIME in the last 168 hours. Cardiac Enzymes: No results for input(s): CKTOTAL, CKMB, CKMBINDEX, TROPONINI in the last 168 hours. BNP (last 3 results) No results for input(s): PROBNP in the last 8760 hours. HbA1C: No results for input(s): HGBA1C in the last 72 hours. CBG:  Recent Labs Lab 07/12/15 2241 07/13/15 0646 07/13/15 0733 07/13/15 0747 07/13/15 1151  GLUCAP 102* 79 94 132* 134*   Lipid Profile: No results for input(s): CHOL, HDL, LDLCALC, TRIG, CHOLHDL, LDLDIRECT in the last 72 hours. Thyroid Function Tests: No results for input(s): TSH, T4TOTAL, FREET4, T3FREE, THYROIDAB in the last 72 hours. Anemia Panel: No results for input(s): VITAMINB12, FOLATE, FERRITIN, TIBC, IRON, RETICCTPCT in the last 72 hours. Sepsis Labs: No results for input(s): PROCALCITON, LATICACIDVEN in the last 168 hours.  No results found for this or any previous visit (from the past 240 hour(s)).       Radiology Studies: US Venous Img Lower Unilateral Left  07/12/2015  CLINICAL DATA:  LEFT foot swelling for 1 month.  Cellulitis. EXAM: LEFT LOWER EXTREMITY VENOUS DOPPLER ULTRASOUND TECHNIQUE: Gray-scale sonography with graded compression, as well as color Doppler and duplex ultrasound were performed to evaluate the lower extremity deep venous systems from the level of the common femoral vein and including the common femoral, femoral, profunda femoral, popliteal and calf veins including the posterior tibial, peroneal and gastrocnemius veins when visible. The superficial great saphenous vein was also interrogated.  Spectral Doppler was utilized to evaluate flow at rest and with distal augmentation maneuvers in the common femoral, femoral and popliteal veins. COMPARISON:  None. FINDINGS: Contralateral Common Femoral Vein: Respiratory phasicity is normal and symmetric with the symptomatic side. No evidence of thrombus. Normal compressibility. Common Femoral Vein: No evidence of thrombus. Normal compressibility, respiratory phasicity and response to augmentation. Saphenofemoral Junction: No evidence of thrombus. Normal compressibility and flow on color Doppler imaging. Profunda Femoral Vein: No evidence of thrombus. Normal compressibility and flow on color Doppler imaging. Femoral Vein: No evidence of thrombus. Normal compressibility, respiratory phasicity and response to augmentation. Popliteal Vein: No evidence of thrombus. Normal compressibility, respiratory phasicity and response to augmentation. Calf Veins: No evidence of thrombus. Normal compressibility and flow on color Doppler imaging. Superficial Great Saphenous Vein: No evidence of thrombus. Normal compressibility and flow on color Doppler imaging. IMPRESSION: No evidence of LEFT lower extremity deep venous thrombosis. Electronically Signed   By: Suzy Bouchard M.D.   On: 07/12/2015 19:55        Scheduled Meds: . amLODipine  10 mg Oral Daily  . aspirin EC  81 mg Oral Daily  . atorvastatin  40 mg Oral q1800  . carvedilol  3.125 mg Oral BID WC  . cholecalciferol  1,000 Units Oral Daily  . enoxaparin (LOVENOX) injection  40 mg Subcutaneous Daily  . insulin aspart  0-5 Units Subcutaneous QHS  . insulin aspart  0-9 Units Subcutaneous TID WC  . isosorbide mononitrate  30 mg Oral Daily  . multivitamin with minerals  1 tablet Oral Daily  . nutrition supplement (JUVEN)  1 packet Oral BID BM  . piperacillin-tazobactam (ZOSYN)  IV  3.375 g Intravenous Q8H  . vancomycin  1,500 mg Intravenous Q24H   Continuous Infusions: . sodium chloride 100 mL/hr at  07/13/15 0958        Time spent: 25 min    Kelvin Cellar, MD [facility] Pager [phone]  If 7PM-7AM, please contact night-coverage [email or web address] Password [facility] 07/13/2015, 3:45 PM

## 2015-07-13 NOTE — Progress Notes (Signed)
Pharmacy Antibiotic Note  Rebekah Johnson is a 55 y.o. female admitted on 07/12/2015 with cellulitis.  Pharmacy has been consulted for Zosyn dosing (already on vancomycin)  Plan: Zosyn 3.375G IV q8h to be infused over 4 hours  Height: 5\' 4"  (162.6 cm) Weight: 184 lb 4.8 oz (83.598 kg) IBW/kg (Calculated) : 54.7  Temp (24hrs), Avg:98.1 F (36.7 C), Min:97.8 F (36.6 C), Max:98.2 F (36.8 C)   Recent Labs Lab 07/12/15 1840  WBC 7.9  CREATININE 1.49*    Estimated Creatinine Clearance: 43.6 mL/min (by C-G formula based on Cr of 1.49).    No Known Allergies    Rebekah Johnson 07/13/2015 12:46 AM

## 2015-07-13 NOTE — Progress Notes (Signed)
   07/13/15 0732  Vitals  BP (!) 190/71 mmHg  MAP (mmHg) 102  BP Location Left Arm  BP Method Automatic  Patient Position (if appropriate) Lying  Pulse Rate 61   Pt BP has not improved after hydralazine and pain medicine given. MD made aware. Pt CBG has improved to 94 after orange juice.

## 2015-07-13 NOTE — Progress Notes (Signed)
   07/13/15 0536  Vitals  Temp 98.9 F (37.2 C)  Temp Source Oral  BP (!) 173/59 mmHg  MAP (mmHg) 89  BP Location Left Arm  BP Method Automatic  Patient Position (if appropriate) Lying  Pulse Rate 61  Pulse Rate Source Monitor  Resp 18  Oxygen Therapy  SpO2 100 %  O2 Device Room Air     MD made aware of BP. Awaiting orders.

## 2015-07-13 NOTE — Progress Notes (Signed)
   07/13/15 0644  Vitals  BP (!) 192/80 mmHg  MAP (mmHg) 109  BP Location Left Arm  BP Method Automatic  Patient Position (if appropriate) Lying  Pulse Rate 71  Oxygen Therapy  O2 Device Room Air     BP after hydralazine given was higher than the first. Pt states that her foot has started hurting again. Pt given pain med and told that nurse would recheck BP again shortly. Pt CBG 79 this morning. Pt states she feels fine, but given orange juice. Will continue to monitor

## 2015-07-13 NOTE — Progress Notes (Addendum)
Initial Nutrition Assessment  DOCUMENTATION CODES:   Obesity unspecified  INTERVENTION:   -MVI daily -Juven BID  NUTRITION DIAGNOSIS:   Increased nutrient needs related to wound healing as evidenced by estimated needs.  GOAL:   Patient will meet greater than or equal to 90% of their needs  MONITOR:   PO intake, Supplement acceptance, Labs, Weight trends, Skin, I & O's  REASON FOR ASSESSMENT:   Malnutrition Screening Tool    ASSESSMENT:   Rebekah Johnson is a 55 y.o. female with medical history significant of HTN, HLD, DM2; who presents with left great toe and second toe pain and swelling. Symptoms have been gradually worsening over the last month. She does not recall having any trauma or injury to the foot.  Pt admitted with DM foot ulcer with questionable cellulitis versus noninfectious inflammatory process.   Attempted to examine pt x3, however, pt was receiving nursing care or using restroom at times of attempts.   Reviewed records from care everywhere. Noted wt of 190# on 05/07/14. Wt has been stable for > 1 year.  Pt consumed 100% of breakfast meal.    No Hgb A1c on file to assess DM control. Per outpatient medication records, home DM medication regimen is as follows: 10 mg glipizide BID, Janumet 50-100 mg BID, Metformin 1000 mg BID.   Labs reviewed: CBGS: 79-132.   Diet Order:  Diet heart healthy/carb modified Room service appropriate?: Yes; Fluid consistency:: Thin  Skin:  Wound (see comment) (lt foot cellulitis)  Last BM:  07/13/15  Height:   Ht Readings from Last 1 Encounters:  07/12/15 5\' 4"  (1.626 m)    Weight:   Wt Readings from Last 1 Encounters:  07/12/15 184 lb 4.8 oz (83.598 kg)    Ideal Body Weight:  54.5 kg  BMI:  Body mass index is 31.62 kg/(m^2).  Estimated Nutritional Needs:   Kcal:  1600-1800  Protein:  80-95 grams  Fluid:  1.6-1.8 L  EDUCATION NEEDS:   No education needs identified at this time  Rebekah Johnson, RD,  LDN, CDE Pager: [phone] After hours Pager: [phone]

## 2015-07-14 DIAGNOSIS — N179 Acute kidney failure, unspecified: Secondary | ICD-10-CM | POA: Diagnosis not present

## 2015-07-14 DIAGNOSIS — L03119 Cellulitis of unspecified part of limb: Secondary | ICD-10-CM | POA: Diagnosis not present

## 2015-07-14 DIAGNOSIS — I10 Essential (primary) hypertension: Secondary | ICD-10-CM | POA: Diagnosis not present

## 2015-07-14 LAB — BASIC METABOLIC PANEL
ANION GAP: 5 (ref 5–15)
BUN: 12 mg/dL (ref 6–20)
CO2: 23 mmol/L (ref 22–32)
Calcium: 9.2 mg/dL (ref 8.9–10.3)
Chloride: 108 mmol/L (ref 101–111)
Creatinine, Ser: 1.14 mg/dL — ABNORMAL HIGH (ref 0.44–1.00)
GFR, EST NON AFRICAN AMERICAN: 52 mL/min — AB (ref >60)
GLUCOSE: 115 mg/dL — AB (ref 65–99)
POTASSIUM: 3.8 mmol/L (ref 3.5–5.1)
Sodium: 136 mmol/L (ref 135–145)

## 2015-07-14 LAB — GLUCOSE, CAPILLARY
GLUCOSE-CAPILLARY: 276 mg/dL — AB (ref 65–99)
Glucose-Capillary: 126 mg/dL — ABNORMAL HIGH (ref 65–99)

## 2015-07-14 LAB — CBC
HEMATOCRIT: 35.5 % — AB (ref 36.0–46.0)
HEMOGLOBIN: 11.5 g/dL — AB (ref 12.0–15.0)
MCH: 26.7 pg (ref 26.0–34.0)
MCHC: 32.4 g/dL (ref 30.0–36.0)
MCV: 82.4 fL (ref 78.0–100.0)
Platelets: 220 10*3/uL (ref 150–400)
RBC: 4.31 MIL/uL (ref 3.87–5.11)
RDW: 13.6 % (ref 11.5–15.5)
WBC: 9.6 10*3/uL (ref 4.0–10.5)

## 2015-07-14 MED ORDER — LISINOPRIL 10 MG PO TABS: 10.0000 mg | ORAL_TABLET | Freq: Every day | ORAL | Status: DC

## 2015-07-14 MED ORDER — ISOSORBIDE MONONITRATE ER 60 MG PO TB24
60.0000 mg | ORAL_TABLET | Freq: Every day | ORAL | Status: DC
Start: 2015-07-14 — End: 2015-07-14
  Administered 2015-07-14: 60 mg via ORAL
  Filled 2015-07-14: qty 1

## 2015-07-14 MED ORDER — ESTROGENS CONJUGATED 0.3 MG PO TABS: 0.3000 mg | ORAL_TABLET | Freq: Every day | ORAL | Status: DC

## 2015-07-14 MED ORDER — AMLODIPINE BESYLATE 10 MG PO TABS: 10.0000 mg | ORAL_TABLET | Freq: Every day | ORAL | Status: DC

## 2015-07-14 MED ORDER — DOXYCYCLINE HYCLATE 100 MG PO CAPS: 100.0000 mg | ORAL_CAPSULE | Freq: Two times a day (BID) | ORAL | Status: DC

## 2015-07-14 MED ORDER — CARVEDILOL 6.25 MG PO TABS: 3.1250 mg | ORAL_TABLET | Freq: Two times a day (BID) | ORAL | Status: DC

## 2015-07-14 MED ORDER — LISINOPRIL 10 MG PO TABS
10.0000 mg | ORAL_TABLET | Freq: Every day | ORAL | Status: DC
  Administered 2015-07-14: 10 mg via ORAL
  Filled 2015-07-14: qty 1

## 2015-07-14 MED ORDER — ISOSORBIDE MONONITRATE ER 60 MG PO TB24: 60.0000 mg | ORAL_TABLET | Freq: Every day | ORAL | Status: DC

## 2015-07-14 NOTE — Progress Notes (Signed)
Nsg Discharge Note  Admit Date:  07/12/2015 Discharge date: 07/14/2015   [name] to be D/C'd Home per MD order.  AVS completed.  Copy for chart, and copy for patient signed, and dated. Patient/caregiver able to verbalize understanding.  Discharge Medication:   Medication List    STOP taking these medications        cephALEXin 500 MG capsule  Commonly known as:  KEFLEX     conjugated estrogens vaginal cream  Commonly known as:  PREMARIN     sulfamethoxazole-trimethoprim 800-160 MG tablet  Commonly known as:  BACTRIM DS,SEPTRA DS      TAKE these medications        amLODipine 10 MG tablet  Commonly known as:  NORVASC  Take 1 tablet (10 mg total) by mouth daily. Reported on 07/13/2015     aspirin 81 MG EC tablet  Take 1 tablet by mouth daily. Reported on 07/13/2015     atorvastatin 40 MG tablet  Commonly known as:  LIPITOR  Take 1 tablet by mouth daily. Reported on 07/13/2015     carvedilol 6.25 MG tablet  Commonly known as:  COREG  Take 0.5 tablets (3.125 mg total) by mouth 2 (two) times daily with a meal. Reported on 07/03/2015     doxycycline 100 MG capsule  Commonly known as:  VIBRAMYCIN  Take 1 capsule (100 mg total) by mouth 2 (two) times daily.     estrogens (conjugated) 0.3 MG tablet  Commonly known as:  PREMARIN  Take 1 tablet (0.3 mg total) by mouth daily. Take daily for 21 days then do not take for 7 days.     Fish Oil 1000 MG Caps  Take 1 capsule by mouth daily. Reported on 07/13/2015     gabapentin 300 MG capsule  Commonly known as:  NEURONTIN  Take 1 capsule (300 mg total) by mouth 3 (three) times daily.     GLIPIZIDE XL 10 MG 24 hr tablet  Generic drug:  glipiZIDE  Take 1 tablet by mouth 2 (two) times daily. Reported on 07/13/2015     isosorbide mononitrate 60 MG 24 hr tablet  Commonly known as:  IMDUR  Take 1 tablet (60 mg total) by mouth daily.     lisinopril 10 MG tablet  Commonly known as:  PRINIVIL,ZESTRIL  Take 1 tablet (10 mg total) by  mouth daily.     LOPID 600 MG tablet  Generic drug:  gemfibrozil  Take 1 tablet by mouth 2 (two) times daily. Reported on 07/13/2015     sitaGLIPtin-metformin 50-1000 MG tablet  Commonly known as:  JANUMET  Take 1 tablet by mouth 2 (two) times daily with a meal. Reported on 07/13/2015     traMADol 50 MG tablet  Commonly known as:  ULTRAM  Take 1 tablet (50 mg total) by mouth every 8 (eight) hours as needed for severe pain.     Vitamin D-3 1000 units Caps  Take 1 capsule by mouth daily.        Discharge Assessment: Filed Vitals:   07/14/15 0713 07/14/15 1310  BP: 173/61 133/66  Pulse: 73 83  Temp:  98 F (36.7 C)  Resp:  19   Skin clean, dry and intact without evidence of skin break down, no evidence of skin tears noted. IV catheter discontinued intact. Site without signs and symptoms of complications - no redness or edema noted at insertion site, patient denies c/o pain - only slight tenderness at site.  Dressing with slight  pressure applied.  D/c Instructions-Education: Discharge instructions given to patient/family with verbalized understanding. D/c education completed with patient/family including follow up instructions, medication list, d/c activities limitations if indicated, with other d/c instructions as indicated by MD - patient able to verbalize understanding, all questions fully answered. Patient instructed to return to ED, call 911, or call MD for any changes in condition.  Patient escorted via [place], and D/C home via private auto.  Dayle Points, RN 07/14/2015 4:15 PM

## 2015-07-14 NOTE — Progress Notes (Signed)
CM met with pt and gave pt [place] letter with list of participating pharmacies.  Pt verbalized understanding of all MATCH parameters.  CM also gave pt [facility] pamphlet and pt verbalized understanding to open clinic Thursday at 08:30.  Cm called [employer] rep, Germaine to please deliver the charity rolling walker to room.  No other CM needs were communicated.

## 2015-07-14 NOTE — Discharge Summary (Addendum)
Physician Discharge Summary  Rebekah Johnson [id number] DOB: Apr 02, 1960 DOA: 07/12/2015  PCP: No PCP Per Patient  Admit date: 07/12/2015 Discharge date: 07/14/2015  Time spent: 35 minutes  Recommendations for Outpatient Follow-up:  1. Please follow-up on blood pressures, she was hypertensive during this hospitalization for which a majority and lisinopril were added to her regimen on day of discharge blood pressures improving to 133/66 2. She was admitted for left foot cellulitis, discharged on doxycycline, please follow-up 3. Case manager consulted to assist with hospital follow-up   Discharge Diagnoses:  Principal Problem:   Diabetic foot ulcer ([place]) Active Problems:   Essential (primary) hypertension   Type 2 diabetes mellitus with peripheral angiopathy ([place])   Cellulitis   AKI (acute kidney injury) ([place])   Discharge Condition: Stable  Diet recommendation: Carbohydrate modified diet  Filed Weights   07/12/15 1759 07/12/15 2251  Weight: 81.647 kg (180 lb) 83.598 kg (184 lb 4.8 oz)    History of present illness:  Rebekah Johnson is a 55 y.o. female with medical history significant of HTN, HLD, DM2; who presents with left great toe and second toe pain and swelling. Symptoms have been gradually worsening over the last month. She does not recall having any trauma or injury to the foot. In the ED 2 weeks ago (6/24) with similar complaints and had x-ray imaging which showed soft tissue swelling but no acute fracture. Referred to podiatry for possible diabetic foot infection. Patient was evaluated by podiatry and they started her on tramadol for pain as well as gave her a 10 day course of Keflex and Bactrim. Patient reports taking medications as advised reports that she only has about 5 or so pills left of these prescriptions. She notes that swelling may help went down some since being on the antibiotics, but she still having significant pain. Tried using the tramadol with 2 extra strength  Tylenol without relief of symptoms. She had some initial nausea while beginning the antibiotics that self resolved. She reports that while on the antibiotics she has tried to keep herself well hydrated. Denies any previous history of gout. There is been no redness/erythema or even warmth to the area affected since onset of symptoms. Associated symptoms include complaints of having numbness and tingling in her feet that does not appear to be new. Denies having any fever, chills, shortness breath, vomiting, diarrhea, chest, or new rash.   Hospital Course:  Rebekah Johnson is a 55 year old female with a past medical history of diabetes mellitus, hypertension, dyslipidemia, presented to the emergency department on 07/13/2015 with complaints of left great toe and second toe swelling. She reported symptoms progressively worsening over the past month. X-ray of her foot showed soft tissue swelling without acute bony abnormalities. She was started on empiric IV antibiotic therapy with Vancomycin and Zosyn.   1. Suspected foot cellulitis -Patient is a pleasant 55 year old with a history of diabetes mellitus, presenting with left great toe and second toe swelling, pain, erythema. On physical examination there is no purulence/discharge or fluctuant masses. X-ray did not show acute bony abnormalities. I did not see associated diabetic ulcer -He was treated with IV antibiotic therapy with vancomycin and Zosyn during this hospitalization -Showing clinical improvement -Blood cultures drawn on 07/13/2015 showing no growth to date -Given significant clinical improvement she was discharged home on 07/14/2015 on a one-week course of doxycycline  2. Type 2 diabetes mellitus -Her blood sugars are well controlled -She was placed back on her home regimen on discharge  3. Hypertension. -  During this hospitalization blood pressures elevated, she was discharged on indoor 60 mg by mouth daily, lisinopril 10 mg by mouth daily  along with Coreg and amlodipine    Discharge Exam: Filed Vitals:   07/14/15 0713 07/14/15 1310  BP: 173/61 133/66  Pulse: 73 83  Temp:  98 F (36.7 C)  Resp:  19    General: No acute distress she is awake and alert, states feeling much better and think she can go home today Cardiovascular: Regular rate and rhythm normal S1-S2 no murmurs or gallops Respiratory: Normal respiratory effort lungs are clear Abdomen: Soft nontender nondistended Extremities: First and second toe of left foot showing interim improvement to erythema, able to flex and extend both toes with thousand and again pain.   Discharge Instructions   Discharge Instructions    Call MD for:  difficulty breathing, headache or visual disturbances    Complete by:  As directed      Call MD for:  extreme fatigue    Complete by:  As directed      Call MD for:  hives    Complete by:  As directed      Call MD for:  persistant dizziness or light-headedness    Complete by:  As directed      Call MD for:  persistant nausea and vomiting    Complete by:  As directed      Call MD for:  redness, tenderness, or signs of infection (pain, swelling, redness, odor or green/yellow discharge around incision site)    Complete by:  As directed      Call MD for:  severe uncontrolled pain    Complete by:  As directed      Call MD for:  temperature >100.4    Complete by:  As directed      Call MD for:    Complete by:  As directed      Diet - low sodium heart healthy    Complete by:  As directed      Increase activity slowly    Complete by:  As directed           Current Discharge Medication List    START taking these medications   Details  doxycycline (VIBRAMYCIN) 100 MG capsule Take 1 capsule (100 mg total) by mouth 2 (two) times daily. Qty: 14 capsule, Refills: 0    estrogens, conjugated, (PREMARIN) 0.3 MG tablet Take 1 tablet (0.3 mg total) by mouth daily. Take daily for 21 days then do not take for 7 days. Qty: 30 tablet,  Refills: 0    isosorbide mononitrate (IMDUR) 60 MG 24 hr tablet Take 1 tablet (60 mg total) by mouth daily. Qty: 30 tablet, Refills: 1    lisinopril (PRINIVIL,ZESTRIL) 10 MG tablet Take 1 tablet (10 mg total) by mouth daily. Qty: 30 tablet, Refills: 1      CONTINUE these medications which have CHANGED   Details  amLODipine (NORVASC) 10 MG tablet Take 1 tablet (10 mg total) by mouth daily. Reported on 07/13/2015 Qty: 30 tablet, Refills: 3    carvedilol (COREG) 6.25 MG tablet Take 0.5 tablets (3.125 mg total) by mouth 2 (two) times daily with a meal. Reported on 07/03/2015 Qty: 60 tablet, Refills: 2      CONTINUE these medications which have NOT CHANGED   Details  aspirin 81 MG EC tablet Take 1 tablet by mouth daily. Reported on 07/13/2015    Cholecalciferol (VITAMIN D-3) 1000 units CAPS Take 1  capsule by mouth daily.    Omega-3 Fatty Acids (FISH OIL) 1000 MG CAPS Take 1 capsule by mouth daily. Reported on 07/13/2015    traMADol (ULTRAM) 50 MG tablet Take 1 tablet (50 mg total) by mouth every 8 (eight) hours as needed for severe pain. Qty: 30 tablet, Refills: 0    atorvastatin (LIPITOR) 40 MG tablet Take 1 tablet by mouth daily. Reported on 07/13/2015    gabapentin (NEURONTIN) 300 MG capsule Take 1 capsule (300 mg total) by mouth 3 (three) times daily. Qty: 90 capsule, Refills: 0    gemfibrozil (LOPID) 600 MG tablet Take 1 tablet by mouth 2 (two) times daily. Reported on 07/13/2015    glipiZIDE (GLIPIZIDE XL) 10 MG 24 hr tablet Take 1 tablet by mouth 2 (two) times daily. Reported on 07/13/2015    sitaGLIPtin-metformin (JANUMET) 50-1000 MG tablet Take 1 tablet by mouth 2 (two) times daily with a meal. Reported on 07/13/2015      STOP taking these medications     cephALEXin (KEFLEX) 500 MG capsule      sulfamethoxazole-trimethoprim (BACTRIM DS,SEPTRA DS) 800-160 MG tablet      conjugated estrogens (PREMARIN) vaginal cream        No Known Allergies    The results of significant  diagnostics from this hospitalization (including imaging, microbiology, ancillary and laboratory) are listed below for reference.    Significant Diagnostic Studies: US Venous Img Lower Unilateral Left  07/12/2015  CLINICAL DATA:  LEFT foot swelling for 1 month.  Cellulitis. EXAM: LEFT LOWER EXTREMITY VENOUS DOPPLER ULTRASOUND TECHNIQUE: Gray-scale sonography with graded compression, as well as color Doppler and duplex ultrasound were performed to evaluate the lower extremity deep venous systems from the level of the common femoral vein and including the common femoral, femoral, profunda femoral, popliteal and calf veins including the posterior tibial, peroneal and gastrocnemius veins when visible. The superficial great saphenous vein was also interrogated. Spectral Doppler was utilized to evaluate flow at rest and with distal augmentation maneuvers in the common femoral, femoral and popliteal veins. COMPARISON:  None. FINDINGS: Contralateral Common Femoral Vein: Respiratory phasicity is normal and symmetric with the symptomatic side. No evidence of thrombus. Normal compressibility. Common Femoral Vein: No evidence of thrombus. Normal compressibility, respiratory phasicity and response to augmentation. Saphenofemoral Junction: No evidence of thrombus. Normal compressibility and flow on color Doppler imaging. Profunda Femoral Vein: No evidence of thrombus. Normal compressibility and flow on color Doppler imaging. Femoral Vein: No evidence of thrombus. Normal compressibility, respiratory phasicity and response to augmentation. Popliteal Vein: No evidence of thrombus. Normal compressibility, respiratory phasicity and response to augmentation. Calf Veins: No evidence of thrombus. Normal compressibility and flow on color Doppler imaging. Superficial Great Saphenous Vein: No evidence of thrombus. Normal compressibility and flow on color Doppler imaging. IMPRESSION: No evidence of LEFT lower extremity deep venous  thrombosis. Electronically Signed   By: Suzy Bouchard M.D.   On: 07/12/2015 19:55   Dg Foot Complete Left  06/29/2015  CLINICAL DATA:  Pain from the medial right ankle to the great toe with no known trauma. EXAM: LEFT FOOT - COMPLETE 3+ VIEW COMPARISON:  None. FINDINGS: Soft tissue swelling is identified. Calcifications are seen in the soft tissues of the medial hindfoot which are nonspecific but probably not acute given history. No acute fractures or dislocations. IMPRESSION: Soft-tissue swelling with no acute fracture identified. Superficial soft tissue calcifications medially in the hindfoot are probably not acute. Electronically Signed   By: Dorise Bullion III M.D  On: 06/29/2015 18:58    Microbiology: No results found for this or any previous visit (from the past 240 hour(s)).   Labs: Basic Metabolic Panel:  Recent Labs Lab 07/12/15 1840 07/13/15 0237 07/14/15 0534  NA 133* 136 136  K 4.0 3.7 3.8  CL 99* 106 108  CO2 22 25 23   GLUCOSE 193* 58* 115*  BUN 21* 15 12  CREATININE 1.49* 1.36* 1.14*  CALCIUM 9.5 9.1 9.2   Liver Function Tests:  Recent Labs Lab 07/13/15 0237  AST 23  ALT 21  ALKPHOS 81  BILITOT 0.4  PROT 6.8  ALBUMIN 3.3*   No results for input(s): LIPASE, AMYLASE in the last 168 hours. No results for input(s): AMMONIA in the last 168 hours. CBC:  Recent Labs Lab 07/12/15 1840 07/13/15 0237 07/14/15 0534  WBC 7.9 7.7 9.6  NEUTROABS 5.3 4.0  --   HGB 13.3 12.1 11.5*  HCT 39.7 37.1 35.5*  MCV 81.2 81.5 82.4  PLT 246 255 220   Cardiac Enzymes: No results for input(s): CKTOTAL, CKMB, CKMBINDEX, TROPONINI in the last 168 hours. BNP: BNP (last 3 results) No results for input(s): BNP in the last 8760 hours.  ProBNP (last 3 results) No results for input(s): PROBNP in the last 8760 hours.  CBG:  Recent Labs Lab 07/13/15 1151 07/13/15 1643 07/13/15 2114 07/14/15 0732 07/14/15 1137  GLUCAP 134* 165* 143* 126* 276*        Signed:  Kelvin Cellar MD.  [facility] 07/14/2015, 1:44 PM

## 2015-07-14 NOTE — Evaluation (Signed)
Physical Therapy Evaluation Patient Details Name: Veleta Guck MRN: [id number] DOB: 1960/07/17 Today's Date: 07/14/2015   History of Present Illness  Ms Izabella is a 55 year old female with a past medical history of diabetes mellitus, hypertension, dyslipidemia, presented to the emergency department on 07/13/2015 with complaints of left great toe and second toe swelling. She reported symptoms progressively worsening over the past month. X-ray of her foot showed soft tissue swelling without acute bony abnormalities.  Clinical Impression  Patient evaluated by Physical Therapy with no further acute PT needs identified. All education has been completed and the patient has no further questions. Pt ambulating safely with RW. See below for any follow-up Physical Therapy or equipment needs. PT is signing off. Thank you for this referral.     Follow Up Recommendations No PT follow up    Equipment Recommendations  Rolling walker with 5" wheels (pt has received)    Recommendations for Other Services       Precautions / Restrictions Precautions Precautions: None Restrictions Weight Bearing Restrictions: No      Mobility  Bed Mobility Overal bed mobility: Modified Independent                Transfers Overall transfer level: Modified independent Equipment used: None                Ambulation/Gait Ambulation/Gait assistance: Modified independent (Device/Increase time) Ambulation Distance (Feet): 40 Feet Assistive device: Rolling walker (2 wheeled) Gait Pattern/deviations: Step-to pattern Gait velocity: decreased Gait velocity interpretation: Below normal speed for age/gender General Gait Details: RW for pain mgmt left foot. Pt safe with RW. Educated on activity level upon return home balanceing between prevention of strength loss and healing of LLE  Stairs            Wheelchair Mobility    Modified Rankin (Stroke Patients Only)       Balance Overall balance  assessment: No apparent balance deficits (not formally assessed)                                           Pertinent Vitals/Pain Pain Assessment: Faces Faces Pain Scale: Hurts a little bit Pain Location: left foot Pain Descriptors / Indicators: Burning Pain Intervention(s): Monitored during session;Premedicated before session    Home Living Family/patient expects to be discharged to:: Private residence Living Arrangements: Spouse/significant other Available Help at Discharge: Family;Available PRN/intermittently Type of Home: House Home Access: Level entry     Home Layout: One level Home Equipment: None Additional Comments: 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, but her pt is physically independent    Prior Function Level of Independence: Independent               Hand Dominance        Extremity/Trunk Assessment   Upper Extremity Assessment: Overall WFL for tasks assessed           Lower Extremity Assessment: Overall WFL for tasks assessed      Cervical / Trunk Assessment: Normal  Communication   Communication: No difficulties  Cognition Arousal/Alertness: Awake/alert Behavior During Therapy: WFL for tasks assessed/performed Overall Cognitive Status: Within Functional Limits for tasks assessed                      General Comments General comments (skin integrity, edema, etc.): pt's left foot is too swollen to wear a shoe, it  is recommended that she not ambulate in only socks. Recommend post op shoe with flat hard shoe for protection and pain mgmt. RN paging MD for approval    Exercises        Assessment/Plan    PT Assessment Patent does not need any further PT services  PT Diagnosis Difficulty walking;Acute pain   PT Problem List    PT Treatment Interventions     PT Goals (Current goals can be found in the Care Plan section) Acute Rehab PT Goals Patient Stated Goal: return home PT Goal Formulation: All assessment and  education complete, DC therapy    Frequency     Barriers to discharge        Co-evaluation               End of Session Equipment Utilized During Treatment: Gait belt Activity Tolerance: Patient tolerated treatment well Patient left: in bed;with call bell/phone within reach;with family/visitor present Nurse Communication: Mobility status         Time: 1425-1446 PT Time Calculation (min) (ACUTE ONLY): 21 min   Charges:   PT Evaluation $PT Eval Low Complexity: 1 Procedure     PT G Codes:      Leighton Roach, PT  Acute Rehab Services  [place], [place] 07/14/2015, 4:10 PM

## 2015-07-14 NOTE — Progress Notes (Signed)
Orthopedic Tech Progress Note Patient Details:  Rebekah Johnson January 08, 1960 [id number]  Ortho Devices Type of Ortho Device: Postop shoe/boot Ortho Device/Splint Location: lle Ortho Device/Splint Interventions: Application   Rebekah Johnson 07/14/2015, 3:15 PM

## 2015-07-16 NOTE — Care Management (Signed)
Received call from [place] at [place] , patient not in [facility] system . Entered patient in system and called Lisa back.   Magdalen Spatz RN BSN [phone]

## 2015-07-18 LAB — CULTURE, BLOOD (ROUTINE X 2)
CULTURE: NO GROWTH
CULTURE: NO GROWTH

## 2015-07-19 ENCOUNTER — Ambulatory Visit: Payer: PRIVATE HEALTH INSURANCE | Attending: Physician Assistant | Admitting: Physician Assistant

## 2015-07-19 ENCOUNTER — Ambulatory Visit: Payer: PRIVATE HEALTH INSURANCE | Attending: Internal Medicine

## 2015-07-19 VITALS — BP 148/77 | HR 70 | Temp 97.5°F | Resp 16 | Wt 181.2 lb

## 2015-07-19 DIAGNOSIS — I10 Essential (primary) hypertension: Secondary | ICD-10-CM

## 2015-07-19 DIAGNOSIS — L03116 Cellulitis of left lower limb: Secondary | ICD-10-CM | POA: Diagnosis not present

## 2015-07-19 DIAGNOSIS — E1151 Type 2 diabetes mellitus with diabetic peripheral angiopathy without gangrene: Secondary | ICD-10-CM | POA: Diagnosis not present

## 2015-07-19 LAB — GLUCOSE, POCT (MANUAL RESULT ENTRY): POC Glucose: 114 mg/dl — AB (ref 70–99)

## 2015-07-19 LAB — POCT GLYCOSYLATED HEMOGLOBIN (HGB A1C): Hemoglobin A1C: 7.5

## 2015-07-19 MED ORDER — FLUCONAZOLE 150 MG PO TABS: 150.0000 mg | ORAL_TABLET | Freq: Once | ORAL | Status: DC

## 2015-07-19 MED ORDER — TRAMADOL HCL 50 MG PO TABS
50.0000 mg | ORAL_TABLET | Freq: Three times a day (TID) | ORAL | Status: DC | PRN
Start: 2015-07-19 — End: 2015-08-15

## 2015-07-19 MED FILL — traMADol HCL 50 MG TABS: 50 | 20 days supply | Qty: 40 | Fill #0

## 2015-07-19 MED FILL — GABAPENTIN 300 MG CAPSULE: 300 | 30 days supply | Qty: 90 | Fill #0

## 2015-07-19 NOTE — Patient Instructions (Signed)
Finish antibiotics

## 2015-07-19 NOTE — Progress Notes (Signed)
Patient ID: Rebekah Johnson, female   DOB: [birth year], 55 y.o.   MRN: [id number]   Rebekah Johnson, is a 55 y.o. female  [id number]  [id number]  DOB - 1960-12-03  Chief Complaint  Patient presents with  . Follow-up    ED for lower extremity pain        Subjective:  Chief Complaint and HPI: Rebekah Johnson is a 55 y.o. female here today to establish care and for a follow up visit after being hospitalized 07/12/2015-07/14/2015 for cellulitis of her L foot(esp great and 2nd toe).  PMH remarkable for DM, HLD, Compromised kidney function, htn, and smoking.   She had insurance and was cared for by a PCP off [place] prior to losing health insurance last fall. Her foot is feeling much better.  She has 7 days worth of antibiotics remaining.  No f/c.  Her only complaint is that she thinks she may be getting a yeast infection and is having some vaginal itching form all the antibiotics she has been on.  She previously weighed about 240-250 pounds and has worked hard to lose weight.  She is a smoker.  Needs RF tramadol for foot pain.   ED/Hospital notes reviewed.    ROS:   Constitutional:  No f/c, No night sweats, No unexplained weight loss. EENT:  No vision changes, No blurry vision, No hearing changes. No mouth, throat, or ear problems.  Respiratory: No cough, No SOB Cardiac: No CP, no palpitations GI:  No abd pain, No N/V/D. GU: No Urinary s/sx Musculoskeletal: +L foot pain Neuro: No headache, no dizziness, no motor weakness.  Skin: No rash Endocrine:  No polydipsia. No polyuria.  Psych: Denies SI/HI  No problems updated.  ALLERGIES: No Known Allergies  PAST MEDICAL HISTORY: Past Medical History  Diagnosis Date  . Diabetes mellitus without complication ([place])   . Hypertension   . High cholesterol     MEDICATIONS AT HOME: Prior to Admission medications   Medication Sig Start Date End Date Taking? Authorizing Provider  amLODipine (NORVASC) 10 MG tablet Take 1 tablet  (10 mg total) by mouth daily. Reported on 07/13/2015 07/14/15 07/13/16 Yes Kelvin Cellar, MD  aspirin 81 MG EC tablet Take 1 tablet by mouth daily. Reported on 07/13/2015   Yes Historical Provider, MD  atorvastatin (LIPITOR) 40 MG tablet Take 1 tablet by mouth daily. Reported on 07/13/2015 11/13/14  Yes Barrie Lyme, FNP  carvedilol (COREG) 6.25 MG tablet Take 0.5 tablets (3.125 mg total) by mouth 2 (two) times daily with a meal. Reported on 07/03/2015 07/14/15  Yes Kelvin Cellar, MD  Cholecalciferol (VITAMIN D-3) 1000 units CAPS Take 1 capsule by mouth daily.   Yes Historical Provider, MD  doxycycline (VIBRAMYCIN) 100 MG capsule Take 1 capsule (100 mg total) by mouth 2 (two) times daily. 07/14/15  Yes Kelvin Cellar, MD  estrogens, conjugated, (PREMARIN) 0.3 MG tablet Take 1 tablet (0.3 mg total) by mouth daily. Take daily for 21 days then do not take for 7 days. 07/14/15  Yes Kelvin Cellar, MD  gabapentin (NEURONTIN) 300 MG capsule Take 1 capsule (300 mg total) by mouth 3 (three) times daily. 06/29/15  Yes Christopher Lawyer, PA-C  gemfibrozil (LOPID) 600 MG tablet Take 1 tablet by mouth 2 (two) times daily. Reported on 07/13/2015 11/13/14 11/13/15 Yes Barrie Lyme, FNP  glipiZIDE (GLIPIZIDE XL) 10 MG 24 hr tablet Take 1 tablet by mouth 2 (two) times daily. Reported on 07/13/2015 11/13/14  Yes Historical Provider, MD  lisinopril (  PRINIVIL,ZESTRIL) 10 MG tablet Take 1 tablet (10 mg total) by mouth daily. 07/14/15  Yes Kelvin Cellar, MD  Omega-3 Fatty Acids (FISH OIL) 1000 MG CAPS Take 1 capsule by mouth daily. Reported on 07/13/2015   Yes Historical Provider, MD  sitaGLIPtin-metformin (JANUMET) 50-1000 MG tablet Take 1 tablet by mouth 2 (two) times daily with a meal. Reported on 07/13/2015 11/13/14  Yes Barrie Lyme, FNP  traMADol (ULTRAM) 50 MG tablet Take 1 tablet (50 mg total) by mouth every 8 (eight) hours as needed for severe pain. 07/19/15  Yes Dionne Bucy McClung, PA-C  fluconazole (DIFLUCAN) 150 MG tablet Take 1  tablet (150 mg total) by mouth once. And repeat dose in 3 days for vaginal yeast infection 07/19/15   Argentina Donovan, PA-C  isosorbide mononitrate (IMDUR) 60 MG 24 hr tablet Take 1 tablet (60 mg total) by mouth daily. Patient not taking: Reported on 07/19/2015 07/14/15   Kelvin Cellar, MD     Objective:  EXAM:   Filed Vitals:   07/19/15 0938  BP: 148/77  Pulse: 70  Temp: 97.5 F (36.4 C)  TempSrc: Oral  Resp: 16  Weight: 181 lb 3.2 oz (82.192 kg)  SpO2: 100%    General appearance : A&OX3. NAD. Non-toxic-appearing.  Ambulates with a walker. HEENT: Atraumatic and Normocephalic.  Neck: supple, no JVD. No cervical lymphadenopathy. No thyromegaly Chest/Lungs:  Breathing-non-labored, Good air entry bilaterally, breath sounds normal without rales, rhonchi, or wheezing  CVS: RRR  Extremities: Bilateral Lower Ext shows no edema, both legs are warm to touch with = pulse throughout L foot-wearing post-op support shoe-DP pulse= but slightly diminished B.  There is minimal swelling at the great and 2nd toe with thickened and overgrown nails(except nail on 2nd toe is missing).  The distal dorsum of the foot is TTP.  Diminished sensation B.  2nd toe slightly darker than others.  Skin is dry.  There is no fluctuance or induration.  No gangrene at this time.  Neurology:  CN II-XII grossly intact, Non focal.   Psych:  TP linear. J/I WNL. Slow but Normal speech. Appropriate eye contact and affect.  Skin:  No Rash  Data Review Lab Results  Component Value Date   HGBA1C 7.5 07/19/2015     Assessment & Plan   1. Type 2 diabetes mellitus with peripheral angiopathy (HCC) Suboptimal but adequate control with continued work on following a diabetic diet - Glucose (CBG) - HgB A1c=7.5 Continue current regimen  2. Essential (primary) hypertension Continue current medications for now.  She is on Imdur, amlodipine, lisinopril, and coreg currently.   3. Cellulitis of left lower extremity Finish  antibiotics Infection seems to be improving and area healing.  There would be concern however for gangrene if this doesn't improve given her history of diabetes, htn, and smoking. She is given s/sx to watch for and is to RTC or ED if necrosis develops.  Diflucan sent for yeast infection.  Hold atorvostatin for 10 days.  4. Smoking cessation discussed-not ready at this time   Patient have been counseled extensively about nutrition and exercise  Return in about 2 weeks (around 08/02/2015) for to establish care and med managenment.   The patient was given clear instructions to go to ER or return to medical center if symptoms don't improve, worsen or new problems develop. The patient verbalized understanding. The patient was told to call to get lab results if they haven't heard anything in the next week.  Freeman Caldron, PA-C [facility] and [facility] [place], [place]   07/19/2015, 10:37 AM

## 2015-07-25 ENCOUNTER — Emergency Department (HOSPITAL_BASED_OUTPATIENT_CLINIC_OR_DEPARTMENT_OTHER): Payer: Self-pay

## 2015-07-25 ENCOUNTER — Telehealth (HOSPITAL_BASED_OUTPATIENT_CLINIC_OR_DEPARTMENT_OTHER): Payer: Self-pay | Admitting: *Deleted

## 2015-07-25 ENCOUNTER — Encounter (HOSPITAL_BASED_OUTPATIENT_CLINIC_OR_DEPARTMENT_OTHER): Payer: Self-pay | Admitting: Emergency Medicine

## 2015-07-25 ENCOUNTER — Emergency Department (HOSPITAL_BASED_OUTPATIENT_CLINIC_OR_DEPARTMENT_OTHER)
Admission: EM | Admit: 2015-07-25 | Discharge: 2015-07-25 | Disposition: A | Discharge status: Home / Self Care | Payer: Self-pay | Attending: Emergency Medicine | Admitting: Emergency Medicine

## 2015-07-25 DIAGNOSIS — F17200 Nicotine dependence, unspecified, uncomplicated: Secondary | ICD-10-CM | POA: Insufficient documentation

## 2015-07-25 DIAGNOSIS — E119 Type 2 diabetes mellitus without complications: Secondary | ICD-10-CM | POA: Insufficient documentation

## 2015-07-25 DIAGNOSIS — M79672 Pain in left foot: Secondary | ICD-10-CM

## 2015-07-25 DIAGNOSIS — N179 Acute kidney failure, unspecified: Secondary | ICD-10-CM | POA: Insufficient documentation

## 2015-07-25 DIAGNOSIS — I10 Essential (primary) hypertension: Secondary | ICD-10-CM | POA: Insufficient documentation

## 2015-07-25 DIAGNOSIS — E7800 Pure hypercholesterolemia, unspecified: Secondary | ICD-10-CM | POA: Insufficient documentation

## 2015-07-25 LAB — BASIC METABOLIC PANEL
Anion gap: 10 (ref 5–15)
BUN: 56 mg/dL — ABNORMAL HIGH (ref 6–20)
CALCIUM: 9.9 mg/dL (ref 8.9–10.3)
CHLORIDE: 100 mmol/L — AB (ref 101–111)
CO2: 24 mmol/L (ref 22–32)
CREATININE: 1.7 mg/dL — AB (ref 0.44–1.00)
GFR calc Af Amer: 37 mL/min — ABNORMAL LOW (ref >60)
GFR calc non Af Amer: 32 mL/min — ABNORMAL LOW (ref >60)
GLUCOSE: 60 mg/dL — AB (ref 65–99)
Potassium: 4.1 mmol/L (ref 3.5–5.1)
Sodium: 134 mmol/L — ABNORMAL LOW (ref 135–145)

## 2015-07-25 LAB — CBC WITH DIFFERENTIAL/PLATELET
BASOS PCT: 0 %
Basophils Absolute: 0.1 10*3/uL (ref 0.0–0.1)
EOS ABS: 0.1 10*3/uL (ref 0.0–0.7)
EOS PCT: 1 %
HEMATOCRIT: 35.1 % — AB (ref 36.0–46.0)
Hemoglobin: 11.7 g/dL — ABNORMAL LOW (ref 12.0–15.0)
Lymphocytes Relative: 30 %
Lymphs Abs: 3.5 10*3/uL (ref 0.7–4.0)
MCH: 27.2 pg (ref 26.0–34.0)
MCHC: 33.3 g/dL (ref 30.0–36.0)
MCV: 81.6 fL (ref 78.0–100.0)
Monocytes Absolute: 1.1 10*3/uL — ABNORMAL HIGH (ref 0.1–1.0)
Monocytes Relative: 9 %
NEUTROS PCT: 60 %
Neutro Abs: 7.1 10*3/uL (ref 1.7–7.7)
Platelets: 384 10*3/uL (ref 150–400)
RBC: 4.3 MIL/uL (ref 3.87–5.11)
RDW: 14.4 % (ref 11.5–15.5)
WBC: 11.8 10*3/uL — AB (ref 4.0–10.5)

## 2015-07-25 LAB — I-STAT CG4 LACTIC ACID, ED: Lactic Acid, Venous: 1.14 mmol/L (ref 0.5–1.9)

## 2015-07-25 LAB — CBG MONITORING, ED
Glucose-Capillary: 55 mg/dL — ABNORMAL LOW (ref 65–99)
Glucose-Capillary: 91 mg/dL (ref 65–99)
Glucose-Capillary: 162 mg/dL — ABNORMAL HIGH (ref 65–99)

## 2015-07-25 MED ORDER — BLOOD GLUCOSE MONITOR KIT: PACK | Status: DC

## 2015-07-25 MED ORDER — SODIUM CHLORIDE 0.9 % IV BOLUS (SEPSIS)
1000.0000 mL | Freq: Once | INTRAVENOUS | Status: AC
  Administered 2015-07-25: 1000 mL via INTRAVENOUS

## 2015-07-25 MED ORDER — ONDANSETRON HCL 4 MG/2ML IJ SOLN
4.0000 mg | Freq: Once | INTRAMUSCULAR | Status: AC
  Administered 2015-07-25: 4 mg via INTRAVENOUS
  Filled 2015-07-25: qty 2

## 2015-07-25 MED ORDER — OXYCODONE-ACETAMINOPHEN 5-325 MG PO TABS: 1.0000 | ORAL_TABLET | Freq: Four times a day (QID) | ORAL | Status: DC | PRN

## 2015-07-25 MED ORDER — DOXYCYCLINE HYCLATE 100 MG PO CAPS: 100.0000 mg | ORAL_CAPSULE | Freq: Two times a day (BID) | ORAL | Status: DC

## 2015-07-25 MED ORDER — MORPHINE SULFATE (PF) 4 MG/ML IV SOLN
4.0000 mg | Freq: Once | INTRAVENOUS | Status: AC
  Administered 2015-07-25: 4 mg via INTRAVENOUS
  Filled 2015-07-25: qty 1

## 2015-07-25 MED ORDER — VANCOMYCIN HCL IN DEXTROSE 1-5 GM/200ML-% IV SOLN
1000.0000 mg | Freq: Once | INTRAVENOUS | Status: AC
  Administered 2015-07-25: 1000 mg via INTRAVENOUS
  Filled 2015-07-25: qty 200

## 2015-07-25 MED FILL — DOXYCYCLINE 100 MG TABLET: 100 | 10 days supply | Qty: 20 | Fill #0

## 2015-07-25 MED FILL — TRUEplus LANCETS 28G MISC: 25 days supply | Qty: 100 | Fill #0

## 2015-07-25 MED FILL — TRUE METRIX TEST STRIP: 25 days supply | Qty: 100 | Fill #0

## 2015-07-25 MED FILL — !TRUE METRIX BLOOD GLUCOSE: 1 days supply | Qty: 1 | Fill #0

## 2015-07-25 NOTE — ED Notes (Addendum)
C/o left foot pain with purulent drainage from great toe left foot seen at [facility] recently for same is a patient at community clinic

## 2015-07-25 NOTE — Discharge Instructions (Signed)
You were seen in the emergency department for left foot pain.  Some of your pain may be secondary to diabetic neuropathy. I recommend you continue your gabapentin as prescribed. We're discharging you with Percocet that she may take for pain if tramadol is not enough. Please do not take tramadol and Percocet together as they are both narcotics. Please do not drive, operate machinery, drink alcohol or make important decisions when taking this medication.   I recommend you follow-up with a podiatrist for management of your feet including your toenails and superficial skin breakdown. I have given you a list of podiatrists in these discharge instructions. There is no obvious sign of infection on your exam today but given your history of pus draining from your great toe we are putting you back on antibiotics. Nothing suggests that you need to be admitted to the hospital at this time or see a surgeon emergently. I do recommend however given your peripheral arterial disease that you follow-up with a vascular surgeon as an outpatient. If your foot is ever cold, blue, numb or you feel like you have swelling, redness, warmth or fever despite being on antibiotics, please return to the emergency department.  Your kidney function is also slightly more elevated than normal. Please avoid any NSAIDs including Goody powders, Aleve, aspirin, ibuprofen. Tylenol is safe. Please drink plain water and I recommend you have this rechecked with your PCP during your next visit. Her creatinine today was 1.70.   Diabetes and Foot Care Diabetes may cause you to have problems because of poor blood supply (circulation) to your feet and legs. This may cause the skin on your feet to become thinner, break easier, and heal more slowly. Your skin may become dry, and the skin may peel and crack. You may also have nerve damage in your legs and feet causing decreased feeling in them. You may not notice minor injuries to your feet that could lead  to infections or more serious problems. Taking care of your feet is one of the most important things you can do for yourself.  HOME CARE INSTRUCTIONS  Wear shoes at all times, even in the house. Do not go barefoot. Bare feet are easily injured.  Check your feet daily for blisters, cuts, and redness. If you cannot see the bottom of your feet, use a mirror or ask someone for help.  Wash your feet with warm water (do not use hot water) and mild soap. Then pat your feet and the areas between your toes until they are completely dry. Do not soak your feet as this can dry your skin.  Apply a moisturizing lotion or petroleum jelly (that does not contain alcohol and is unscented) to the skin on your feet and to dry, brittle toenails. Do not apply lotion between your toes.  Trim your toenails straight across. Do not dig under them or around the cuticle. File the edges of your nails with an emery board or nail file.  Do not cut corns or calluses or try to remove them with medicine.  Wear clean socks or stockings every day. Make sure they are not too tight. Do not wear knee-high stockings since they may decrease blood flow to your legs.  Wear shoes that fit properly and have enough cushioning. To break in new shoes, wear them for just a few hours a day. This prevents you from injuring your feet. Always look in your shoes before you put them on to be sure there are no objects  inside.  Do not cross your legs. This may decrease the blood flow to your feet.  If you find a minor scrape, cut, or break in the skin on your feet, keep it and the skin around it clean and dry. These areas may be cleansed with mild soap and water. Do not cleanse the area with peroxide, alcohol, or iodine.  When you remove an adhesive bandage, be sure not to damage the skin around it.  If you have a wound, look at it several times a day to make sure it is healing.  Do not use heating pads or hot water bottles. They may burn your  skin. If you have lost feeling in your feet or legs, you may not know it is happening until it is too late.  Make sure your health care provider performs a complete foot exam at least annually or more often if you have foot problems. Report any cuts, sores, or bruises to your health care provider immediately. SEEK MEDICAL CARE IF:   You have an injury that is not healing.  You have cuts or breaks in the skin.  You have an ingrown nail.  You notice redness on your legs or feet.  You feel burning or tingling in your legs or feet.  You have pain or cramps in your legs and feet.  Your legs or feet are numb.  Your feet always feel cold. SEEK IMMEDIATE MEDICAL CARE IF:   There is increasing redness, swelling, or pain in or around a wound.  There is a red line that goes up your leg.  Pus is coming from a wound.  You develop a fever or as directed by your health care provider.  You notice a bad smell coming from an ulcer or wound.   This information is not intended to replace advice given to you by your health care provider. Make sure you discuss any questions you have with your health care provider.   Document Released: 12/20/1999 Document Revised: 08/24/2012 Document Reviewed: 05/31/2012 Elsevier Interactive Patient Education 2016 [place], [place].  [place], [place]  [phone]   [place], [place]  [phone]    [address], [place], [place]  [phone] [place], [place], [place], [place]  [phone]   Alvina Filbert. Claudine Mouton  [place], [place]  [phone]  Dr. [facility]  [place], [place]  [phone]    Acute Kidney Injury Acute kidney injury is any condition in which there is sudden (acute) damage to the kidneys. Acute kidney injury was previously known as acute kidney failure or acute renal failure. The kidneys are two organs that lie on either  side of the spine between the middle of the back and the front of the abdomen. The kidneys:  Remove wastes and extra water from the blood.   Produce important hormones. These help keep bones strong, regulate blood pressure, and help create red blood cells.   Balance the fluids and chemicals in the blood and tissues. A small amount of kidney damage may not cause problems, but a large amount of damage may make it difficult or impossible for the kidneys to work the way they should. Acute kidney injury may develop into long-lasting (chronic) kidney disease. It may also develop into a life-threatening disease called end-stage kidney disease. Acute kidney injury can get worse very quickly, so it should be treated right away.  Early treatment may prevent other kidney diseases from developing. CAUSES   A problem with blood flow to the kidneys. This may be caused by:   Blood loss.   Heart disease.   Severe burns.   Liver disease.  Direct damage to the kidneys. This may be caused by:  Some medicines.   A kidney infection.   Poisoning or consuming toxic substances.   A surgical wound.   A blow to the kidney area.   A problem with urine flow. This may be caused by:   Cancer.   Kidney stones.   An enlarged prostate. SIGNS AND SYMPTOMS   Swelling (edema) of the legs, ankles, or feet.   Tiredness (lethargy).   Nausea or vomiting.   Confusion.   Problems with urination, such as:   Painful or burning feeling during urination.   Decreased urine production.   Frequent accidents in children who are potty trained.   Bloody urine.   Muscle twitches and cramps.   Shortness of breath.   Seizures.   Chest pain or pressure. Sometimes, no symptoms are present. DIAGNOSIS Acute kidney injury may be detected and diagnosed by tests, including blood, urine, imaging, or kidney biopsy tests.  TREATMENT Treatment of acute kidney injury varies depending on  the cause and severity of the kidney damage. In mild cases, no treatment may be needed. The kidneys may heal on their own. If acute kidney injury is more severe, your health care provider will treat the cause of the kidney damage, help the kidneys heal, and prevent complications from occurring. Severe cases may require a procedure to remove toxic wastes from the body (dialysis) or surgery to repair kidney damage. Surgery may involve:   Repair of a torn kidney.   Removal of an obstruction. HOME CARE INSTRUCTIONS  Follow your prescribed diet.  Take medicines only as directed by your health care provider.  Do not take any new medicines (prescription, over-the-counter, or nutritional supplements) unless approved by your health care provider. Many medicines can worsen your kidney damage or may need to have the dose adjusted.   Keep all follow-up visits as directed by your health care provider. This is important.  Observe your condition to make sure you are healing as expected. SEEK IMMEDIATE MEDICAL CARE IF:  You are feeling ill or have severe pain in the back or side.   Your symptoms return or you have new symptoms.  You have any symptoms of end-stage kidney disease. These include:   Persistent itchiness.   Loss of appetite.   Headaches.   Abnormally dark or light skin.  Numbness in the hands or feet.   Easy bruising.   Frequent hiccups.   Menstruation stops.   You have a fever.  You have increased urine production.  You have pain or bleeding when urinating. MAKE SURE YOU:   Understand these instructions.  Will watch your condition.  Will get help right away if you are not doing well or get worse.   This information is not intended to replace advice given to you by your health care provider. Make sure you discuss any questions you have with your health care provider.   Document Released: 07/07/2010 Document Revised: 01/12/2014 Document Reviewed:  08/21/2011 Elsevier Interactive Patient Education [employer].

## 2015-07-25 NOTE — Telephone Encounter (Signed)
Pt called because she did not receive a work note for a week off with her discharge instructions as the doctor said she would. Chart reviewed. Note left in registration for pt to pick up. Pt advised to return to ED for any worsening symptoms if she was not able to get a timely appointment for f/u with her PCP

## 2015-07-25 NOTE — ED Provider Notes (Signed)
TIME SEEN: 2:30 AM  CHIEF COMPLAINT: Left foot pain  HPI: Patient is a 55 y.o. female with history of hypertension, hyperlipidemia, non-insulin-dependent diabetes who presents emergency department complaining of left foot pain. Patient was initially seen in the emergency department on 06/29/2015 and diagnosed with cellulitis. She was started on Cipro and clindamycin. She never filled these antibiotics because of financial reasons. [facility] and was given IM Rocephin. Returned the next day to the clinic for reevaluation and was discharged on 10 days worth of Keflex and Bactrim. Patient returned to the emergency department on July 7 as her cellulitis was not improving despite oral antibiotics. She was admitted to the hospital and given vancomycin and Zosyn and discharged on July 9. She was discharged on 1 week of doxycycline which she reports she has finished. [facility] where her pain and swelling and redness had improved per their note. Was discharged with 40 tablets of tramadol on July 14. She states that she's been taking Tylenol and tramadol at home without any relief. She denies any fevers or chills. She has had nausea and vomiting. She reports compliance with her glipizide and Janumet. She denies that she has not been checking her blood sugar at home. She was also prescribed gabapentin which she states she is taking 3 times a day. Denies any new history of injury to this foot. States that earlier today she noticed pus coming out from underneath the toenail of the left great toe. She states she feels the swelling and redness in the foot have resolved and have not returned. She states the pain however has been present since the end of June. States she called her PCP yesterday afternoon and they instructed her to come to the emergency department.   Patient had an x-ray of her left foot on June 24 which is unremarkable.  Patient also had a venous Doppler of her left lower extremity which was negative for DVT on July 7.  ROS: See HPI Constitutional: no fever  Eyes: no drainage  ENT: no runny nose   Cardiovascular:  no chest pain  Resp: no SOB  GI: no vomiting GU: no dysuria Integumentary: no rash  Allergy: no hives  Musculoskeletal: no leg swelling  Neurological: no slurred speech ROS otherwise negative  PAST MEDICAL HISTORY/PAST SURGICAL HISTORY:  Past Medical History  Diagnosis Date  . Diabetes mellitus without complication ([place])   . Hypertension   . High cholesterol     MEDICATIONS:  Prior to Admission medications   Medication Sig Start Date End Date Taking? Authorizing Provider  amLODipine (NORVASC) 10 MG tablet Take 1 tablet (10 mg total) by mouth daily. Reported on 07/13/2015 07/14/15 07/13/16  Kelvin Cellar, MD  aspirin 81 MG EC tablet Take 1 tablet by mouth daily. Reported on 07/13/2015    Historical Provider, MD  atorvastatin (LIPITOR) 40 MG tablet Take 1 tablet by mouth daily. Reported on 07/13/2015 11/13/14   Barrie Lyme, FNP  carvedilol (COREG) 6.25 MG tablet Take 0.5 tablets (3.125 mg total) by mouth 2 (two) times daily with a meal. Reported on 07/03/2015 07/14/15   Kelvin Cellar, MD  Cholecalciferol (VITAMIN D-3) 1000 units CAPS Take 1 capsule by mouth daily.    Historical Provider, MD  doxycycline (VIBRAMYCIN) 100 MG capsule Take 1 capsule (100 mg total) by mouth 2 (two) times daily. 07/14/15   Kelvin Cellar, MD  estrogens, conjugated, (  PREMARIN) 0.3 MG tablet Take 1 tablet (0.3 mg total) by mouth daily. Take daily for 21 days then do not take for 7 days. 07/14/15   Kelvin Cellar, MD  fluconazole (DIFLUCAN) 150 MG tablet Take 1 tablet (150 mg total) by mouth once. And repeat dose in 3 days for vaginal yeast infection 07/19/15   Argentina Donovan, PA-C  gabapentin (NEURONTIN) 300 MG capsule Take 1 capsule (300 mg total) by mouth 3 (three) times daily. 06/29/15   Dalia Heading, PA-C    gemfibrozil (LOPID) 600 MG tablet Take 1 tablet by mouth 2 (two) times daily. Reported on 07/13/2015 11/13/14 11/13/15  Barrie Lyme, FNP  glipiZIDE (GLIPIZIDE XL) 10 MG 24 hr tablet Take 1 tablet by mouth 2 (two) times daily. Reported on 07/13/2015 11/13/14   Historical Provider, MD  isosorbide mononitrate (IMDUR) 60 MG 24 hr tablet Take 1 tablet (60 mg total) by mouth daily. Patient not taking: Reported on 07/19/2015 07/14/15   Kelvin Cellar, MD  lisinopril (PRINIVIL,ZESTRIL) 10 MG tablet Take 1 tablet (10 mg total) by mouth daily. 07/14/15   Kelvin Cellar, MD  Omega-3 Fatty Acids (FISH OIL) 1000 MG CAPS Take 1 capsule by mouth daily. Reported on 07/13/2015    Historical Provider, MD  sitaGLIPtin-metformin (JANUMET) 50-1000 MG tablet Take 1 tablet by mouth 2 (two) times daily with a meal. Reported on 07/13/2015 11/13/14   Barrie Lyme, FNP  traMADol (ULTRAM) 50 MG tablet Take 1 tablet (50 mg total) by mouth every 8 (eight) hours as needed for severe pain. 07/19/15   Argentina Donovan, PA-C    ALLERGIES:  No Known Allergies  SOCIAL HISTORY:  Social History  Substance Use Topics  . Smoking status: Current Every Day Smoker -- 1.00 packs/day    Types: Cigarettes  . Smokeless tobacco: Never Used  . Alcohol Use: 0.0 oz/week    0 Standard drinks or equivalent per week     Comment: on social occassions    FAMILY HISTORY: Family History  Problem Relation Age of Onset  . Diabetes Mother   . Hypertension Father     EXAM: BP 146/70 mmHg  Pulse 73  Temp(Src) 98.2 F (36.8 C) (Oral)  Resp 18  Ht 5\' 4"  (1.626 m)  Wt 180 lb (81.647 kg)  BMI 30.88 kg/m2  SpO2 98% CONSTITUTIONAL: Alert and oriented and responds appropriately to questions. Well-appearing; well-nourished HEAD: Normocephalic EYES: Conjunctivae clear, PERRL ENT: normal nose; no rhinorrhea; moist mucous membranes NECK: Supple, no meningismus, no LAD  CARD: RRR; S1 and S2 appreciated; no murmurs, no clicks, no rubs, no gallops RESP:  Normal chest excursion without splinting or tachypnea; breath sounds clear and equal bilaterally; no wheezes, no rhonchi, no rales, no hypoxia or respiratory distress, speaking full sentences ABD/GI: Normal bowel sounds; non-distended; soft, non-tender, no rebound, no guarding, no peritoneal signs BACK:  The back appears normal and is non-tender to palpation, there is no CVA tenderness EXT: Patient has bilateral onychomycosis, I am able to Doppler a strong biphasic left-sided PT pulse and she has a left-sided monophasic DP pulse that is dopplerable. Extremity is warm and well-perfused. Compartments are soft. No joint effusion. There is no redness, warmth or swelling noted to the left foot. She is tender to palpation throughout the first and second toes on the left side without bony deformity. There is no sign of any purulent drainage or open wound noted. There is no induration or fluctuance on exam. I do not see any drainage of  pus. She does appear to have 2 small scabbed lesions to the distal portion of the dorsal left second toe and has lost the toenail of the second and fifth toe of this foot. She is tenderness or swelling on exam. Range of motion in all joints. Otherwise extremity is her nontender to palpation. No cyanosis. Normal capillary refill. No crepitus on exam of the left foot. No signs of gangrene on exam. She does have some superficial skin sloughing noted to the left foot. (see picture below, appears improved compared to picture on 06/29/2015) SKIN: Normal color for age and race; warm; no rash NEURO: Moves all extremities equally, sensation to light touch intact diffusely, cranial nerves II through XII intact PSYCH: The patient's mood and manner are appropriate. Grooming and personal hygiene are appropriate.  MEDICAL DECISION MAKING: Patient here with continued pain to the left foot. No obvious sign of cellulitis currently but she reports drainage of pus from the first great toe under the  toenail earlier today. I have tried to express this area with no drainage and no fluctuance. Because of this report we will put her back on antibiotics and give her a dose of vancomycin now. Will check labs and repeat an x-ray of the left foot to evaluate for any signs of osteomyelitis. No signs of subcutaneous gas on exam. She states that her pain is uncontrolled with tramadol at home. She is requesting something stronger for pain. She is also concerned because she states that the told her she could go back to work on Monday and she does not feel ready for this. She is requesting a work note. She states she does not want to be readmitted to the hospital. She states she has follow-up with her PCP the first week of August. She does not currently have a podiatrist. She is wearing a boot at all times.  ED PROGRESS: Patient's x-ray shows no fracture, soft tissue emphysema, sign of osteomyelitis. Labs show mild leukocytosis of 11.8 without left shift. Lactate is normal. Her blood glucose is low at 55. We have given her food and will recheck her blood sugar. Creatinine is elevated at 1.7 which is higher than she normally runs. She normally appears to end between 1.1 and 1.4. We'll give IV fluids.   I have discussed with patient and family member at bedside at length that she needs close outpatient follow-up. I suspect she will need wound management and to see a podiatrist. Given her monophasic flow in her DP pulses have also recommended she follow-up with a vascular surgeon but there are no signs currently that she needs to see a vascular surgeon emergently. I do not appreciate any gangrene on exam or significant cellulitis. There is no obvious drainable fluid collection on exam either and no purulent discharge. However given her report of pus drainage underneath the first great toenail, will discharge back on doxycycline and she feels this has helped her in the past. We'll discharge with prescription for Percocet  for better pain control but have discussed with her that given this is a more chronic condition she will need further pain management with her outpatient provider.  She has an appointment to see [facility] on August 2. Discussed with her she has worsening symptoms she should try to see her PCP sooner or come back to the emergency department.   Patient does state to me that she feels like she does have regular episodes where she feels her blood sugar drops. She states  she will get clammy, sweaty. She does not check her blood sugar however. I have advised her that she needs to check her blood sugar regularly have her blood sugars frequently below 60 that she should hold her glipizide. Her blood pressure in the emergency department appears well controlled and have recommended she continue her blood pressure medications as prescribed. Have recommended she continue her gabapentin for neuropathy.  5:50 AM  Pt is still hemodynamically stable. Blood glucose continues to rise. I feel she is safe to be discharged home with close outpatient follow-up. She is comfortable with this plan.  At this time, I do not feel there is any life-threatening condition present. I have reviewed and discussed all results (EKG, imaging, lab, urine as appropriate), exam findings with patient. I have reviewed nursing notes and appropriate previous records.  I feel the patient is safe to be discharged home without further emergent workup. Discussed usual and customary return precautions. Patient and family (if present) verbalize understanding and are comfortable with this plan.  Patient will follow-up with their primary care provider. If they do not have a primary care provider, information for follow-up has been provided to them. All questions have been answered.      [place], DO 07/25/15 [phone]

## 2015-07-25 NOTE — ED Notes (Signed)
CBG 55 per blood obtained from lab draw Dr Leonides Schanz notified and graham crackers and OJ given

## 2015-07-25 NOTE — ED Notes (Signed)
MD at bedside. 

## 2015-08-07 ENCOUNTER — Ambulatory Visit: Payer: Self-pay | Attending: Family Medicine | Admitting: Family Medicine

## 2015-08-07 ENCOUNTER — Encounter: Payer: Self-pay | Admitting: Family Medicine

## 2015-08-07 ENCOUNTER — Ambulatory Visit: Payer: Self-pay

## 2015-08-07 VITALS — BP 187/64 | HR 71 | Temp 98.3°F | Ht 64.0 in | Wt 178.4 lb

## 2015-08-07 DIAGNOSIS — Z7982 Long term (current) use of aspirin: Secondary | ICD-10-CM | POA: Insufficient documentation

## 2015-08-07 DIAGNOSIS — L97529 Non-pressure chronic ulcer of other part of left foot with unspecified severity: Secondary | ICD-10-CM

## 2015-08-07 DIAGNOSIS — I10 Essential (primary) hypertension: Secondary | ICD-10-CM | POA: Insufficient documentation

## 2015-08-07 DIAGNOSIS — N951 Menopausal and female climacteric states: Secondary | ICD-10-CM

## 2015-08-07 DIAGNOSIS — Z90710 Acquired absence of both cervix and uterus: Secondary | ICD-10-CM | POA: Insufficient documentation

## 2015-08-07 DIAGNOSIS — E785 Hyperlipidemia, unspecified: Secondary | ICD-10-CM | POA: Insufficient documentation

## 2015-08-07 DIAGNOSIS — E11621 Type 2 diabetes mellitus with foot ulcer: Secondary | ICD-10-CM | POA: Insufficient documentation

## 2015-08-07 DIAGNOSIS — B353 Tinea pedis: Secondary | ICD-10-CM | POA: Insufficient documentation

## 2015-08-07 DIAGNOSIS — E118 Type 2 diabetes mellitus with unspecified complications: Secondary | ICD-10-CM | POA: Insufficient documentation

## 2015-08-07 DIAGNOSIS — R579 Shock, unspecified: Secondary | ICD-10-CM | POA: Insufficient documentation

## 2015-08-07 DIAGNOSIS — N179 Acute kidney failure, unspecified: Secondary | ICD-10-CM | POA: Insufficient documentation

## 2015-08-07 DIAGNOSIS — E1149 Type 2 diabetes mellitus with other diabetic neurological complication: Secondary | ICD-10-CM

## 2015-08-07 DIAGNOSIS — E1151 Type 2 diabetes mellitus with diabetic peripheral angiopathy without gangrene: Secondary | ICD-10-CM

## 2015-08-07 DIAGNOSIS — I739 Peripheral vascular disease, unspecified: Secondary | ICD-10-CM

## 2015-08-07 DIAGNOSIS — Z780 Asymptomatic menopausal state: Secondary | ICD-10-CM | POA: Insufficient documentation

## 2015-08-07 DIAGNOSIS — Z79899 Other long term (current) drug therapy: Secondary | ICD-10-CM | POA: Insufficient documentation

## 2015-08-07 LAB — GLUCOSE, POCT (MANUAL RESULT ENTRY): POC Glucose: 97 mg/dl (ref 70–99)

## 2015-08-07 MED ORDER — ATORVASTATIN CALCIUM 40 MG PO TABS: 40.0000 mg | ORAL_TABLET | Freq: Every day | ORAL | 3 refills | Status: DC

## 2015-08-07 MED ORDER — CARVEDILOL 3.125 MG PO TABS: 3.1250 mg | ORAL_TABLET | Freq: Two times a day (BID) | ORAL | 3 refills | Status: DC

## 2015-08-07 MED ORDER — ISOSORBIDE MONONITRATE ER 60 MG PO TB24: 60.0000 mg | ORAL_TABLET | Freq: Every day | ORAL | 3 refills | Status: DC

## 2015-08-07 MED ORDER — GABAPENTIN 300 MG PO CAPS: 300.0000 mg | ORAL_CAPSULE | Freq: Three times a day (TID) | ORAL | 3 refills | Status: DC

## 2015-08-07 MED ORDER — ASPIRIN 81 MG PO TBEC
81.0000 mg | DELAYED_RELEASE_TABLET | Freq: Every day | ORAL | 3 refills | Status: DC
Start: 2015-08-07 — End: 2016-05-08

## 2015-08-07 MED ORDER — GLIPIZIDE 10 MG PO TABS: 10.0000 mg | ORAL_TABLET | Freq: Two times a day (BID) | ORAL | 3 refills | Status: DC

## 2015-08-07 MED ORDER — ESTROGENS CONJUGATED 0.3 MG PO TABS: 0.3000 mg | ORAL_TABLET | Freq: Every day | ORAL | 0 refills | Status: DC

## 2015-08-07 MED ORDER — SITAGLIPTIN PHOSPHATE 50 MG PO TABS: 50.0000 mg | ORAL_TABLET | Freq: Every day | ORAL | 3 refills | Status: DC

## 2015-08-07 MED ORDER — TERBINAFINE HCL 1 % EX CREA: 1.0000 "application " | TOPICAL_CREAM | Freq: Two times a day (BID) | CUTANEOUS | 0 refills | Status: DC

## 2015-08-07 MED ORDER — LISINOPRIL 10 MG PO TABS: 10.0000 mg | ORAL_TABLET | Freq: Every day | ORAL | 3 refills | Status: DC

## 2015-08-07 MED ORDER — AMLODIPINE BESYLATE 10 MG PO TABS: 10.0000 mg | ORAL_TABLET | Freq: Every day | ORAL | 3 refills | Status: DC

## 2015-08-07 MED FILL — ISOSORBIDE MN ER 60 MG TAB: 60 | 30 days supply | Qty: 30 | Fill #0

## 2015-08-07 MED FILL — ?AMLODIPINE BESYLATE 10 MG: 10 | 30 days supply | Qty: 30 | Fill #0

## 2015-08-07 MED FILL — ?CARVEDILOL 3.125 MG TABLET: 3.125 | 30 days supply | Qty: 60 | Fill #0

## 2015-08-07 MED FILL — ?LISINOPRIL 10 MG TABLET: 10 | 30 days supply | Qty: 30 | Fill #0

## 2015-08-07 MED FILL — ?ATORVASTATIN 40MG TABLET: 40 | 30 days supply | Qty: 30 | Fill #0

## 2015-08-07 MED FILL — JANUVIA 50 MG TABLET: 50 | 30 days supply | Qty: 30 | Fill #0

## 2015-08-07 MED FILL — glipiZIDE 10 MG TABS: 10 | 3 days supply | Qty: 60 | Fill #0

## 2015-08-07 NOTE — Patient Instructions (Signed)
Menopause Menopause is the normal time of life when menstrual periods stop completely. Menopause is complete when you have missed 12 consecutive menstrual periods. It usually occurs between the ages of 48 years and 55 years. Very rarely does a woman develop menopause before the age of 40 years. At menopause, your ovaries stop producing the female hormones estrogen and progesterone. This can cause undesirable symptoms and also affect your health. Sometimes the symptoms may occur 4-5 years before the menopause begins. There is no relationship between menopause and:  Oral contraceptives.  Number of children you had.  Race.  The age your menstrual periods started (menarche). Heavy smokers and very thin women may develop menopause earlier in life. CAUSES  The ovaries stop producing the female hormones estrogen and progesterone.  Other causes include:  Surgery to remove both ovaries.  The ovaries stop functioning for no known reason.  Tumors of the pituitary gland in the brain.  Medical disease that affects the ovaries and hormone production.  Radiation treatment to the abdomen or pelvis.  Chemotherapy that affects the ovaries. SYMPTOMS   Hot flashes.  Night sweats.  Decrease in sex drive.  Vaginal dryness and thinning of the vagina causing painful intercourse.  Dryness of the skin and developing wrinkles.  Headaches.  Tiredness.  Irritability.  Memory problems.  Weight gain.  Bladder infections.  Hair growth of the face and chest.  Infertility. More serious symptoms include:  Loss of bone (osteoporosis) causing breaks (fractures).  Depression.  Hardening and narrowing of the arteries (atherosclerosis) causing heart attacks and strokes. DIAGNOSIS   When the menstrual periods have stopped for 12 straight months.  Physical exam.  Hormone studies of the blood. TREATMENT  There are many treatment choices and nearly as many questions about them. The  decisions to treat or not to treat menopausal changes is an individual choice made with your health care provider. Your health care provider can discuss the treatments with you. Together, you can decide which treatment will work best for you. Your treatment choices may include:   Hormone therapy (estrogen and progesterone).  Non-hormonal medicines.  Treating the individual symptoms with medicine (for example antidepressants for depression).  Herbal medicines that may help specific symptoms.  Counseling by a psychiatrist or psychologist.  Group therapy.  Lifestyle changes including:  Eating healthy.  Regular exercise.  Limiting caffeine and alcohol.  Stress management and meditation.  No treatment. HOME CARE INSTRUCTIONS   Take the medicine your health care provider gives you as directed.  Get plenty of sleep and rest.  Exercise regularly.  Eat a diet that contains calcium (good for the bones) and soy products (acts like estrogen hormone).  Avoid alcoholic beverages.  Do not smoke.  If you have hot flashes, dress in layers.  Take supplements, calcium, and vitamin D to strengthen bones.  You can use over-the-counter lubricants or moisturizers for vaginal dryness.  Group therapy is sometimes very helpful.  Acupuncture may be helpful in some cases. SEEK MEDICAL CARE IF:   You are not sure you are in menopause.  You are having menopausal symptoms and need advice and treatment.  You are still having menstrual periods after age 55 years.  You have pain with intercourse.  Menopause is complete (no menstrual period for 12 months) and you develop vaginal bleeding.  You need a referral to a specialist (gynecologist, psychiatrist, or psychologist) for treatment. SEEK IMMEDIATE MEDICAL CARE IF:   You have severe depression.  You have excessive vaginal bleeding.    You fell and think you have a broken bone.  You have pain when you urinate.  You develop leg or  chest pain.  You have a fast pounding heart beat (palpitations).  You have severe headaches.  You develop vision problems.  You feel a lump in your breast.  You have abdominal pain or severe indigestion.   This information is not intended to replace advice given to you by your health care provider. Make sure you discuss any questions you have with your health care provider.   Document Released: 03/14/2003 Document Revised: 08/24/2012 Document Reviewed: 07/21/2012 Elsevier Interactive Patient Education 2016 [employer]vier Inc.  

## 2015-08-07 NOTE — Progress Notes (Signed)
Medication refill

## 2015-08-07 NOTE — Progress Notes (Signed)
Subjective:  Patient ID: Rebekah Johnson, female    DOB: 07-15-1960  Age: 55 y.o. MRN: [id number]  CC: Diabetes; Foot Pain (left foot infection- sore between great toe and 2nd toe); and Hypertension   HPI Rebekah Johnson is a 55 year old female with a history of type 2 diabetes mellitus (A1c 7.5), hepatic neuropathy, hypertension, peripheral vascular disease, hyperlipidemia, acute on chronic kidney injury who presents to the clinic for follow-up visit.  She was hospitalized for left foot cellulitis and has completed her course of antibiotics and reports improvement in edema and pain however she does have a dark discoloration of her big toe and second toe of the left foot; recently had an ED visit 2 weeks ago for same again where she received doxycycline which she has completed.  She complains of sloughing of the skin of her toes with some pain; she has sensory loss in her left foot. Requesting refill of her medications and requests Premarin which she states she has been on for the last 10-15 years ever since she had a hysterectomy. States she was initially on 0.6 which was now cut down in half and this current dose does not improve her symptoms.  Glipizide appears on her chart but she informs me she has not been taking this as it drops her sugars but she has been compliant with Janumet.  Outpatient Medications Prior to Visit  Medication Sig Dispense Refill  . blood glucose meter kit and supplies KIT Dispense based on patient and insurance preference. Use up to four times daily as directed. (FOR ICD-9 250.00, 250.01). 1 each 3  . Cholecalciferol (VITAMIN D-3) 1000 units CAPS Take 1 capsule by mouth daily.    [name] gemfibrozil (LOPID) 600 MG tablet Take 1 tablet by mouth 2 (two) times daily. Reported on 07/13/2015    . Omega-3 Fatty Acids (FISH OIL) 1000 MG CAPS Take 1 capsule by mouth daily. Reported on 07/13/2015    . oxyCODONE-acetaminophen (PERCOCET/ROXICET) 5-325 MG tablet Take 1-2 tablets by mouth  every 6 (six) hours as needed. 25 tablet 0  . amLODipine (NORVASC) 10 MG tablet Take 1 tablet (10 mg total) by mouth daily. Reported on 07/13/2015 30 tablet 3  . aspirin 81 MG EC tablet Take 1 tablet by mouth daily. Reported on 07/13/2015    . atorvastatin (LIPITOR) 40 MG tablet Take 1 tablet by mouth daily. Reported on 07/13/2015    . carvedilol (COREG) 6.25 MG tablet Take 0.5 tablets (3.125 mg total) by mouth 2 (two) times daily with a meal. Reported on 07/03/2015 60 tablet 2  . estrogens, conjugated, (PREMARIN) 0.3 MG tablet Take 1 tablet (0.3 mg total) by mouth daily. Take daily for 21 days then do not take for 7 days. 30 tablet 0  . gabapentin (NEURONTIN) 300 MG capsule Take 1 capsule (300 mg total) by mouth 3 (three) times daily. 90 capsule 0  . isosorbide mononitrate (IMDUR) 60 MG 24 hr tablet Take 1 tablet (60 mg total) by mouth daily. 30 tablet 1  . lisinopril (PRINIVIL,ZESTRIL) 10 MG tablet Take 1 tablet (10 mg total) by mouth daily. 30 tablet 1  . sitaGLIPtin-metformin (JANUMET) 50-1000 MG tablet Take 1 tablet by mouth 2 (two) times daily with a meal. Reported on 07/13/2015    . traMADol (ULTRAM) 50 MG tablet Take 1 tablet (50 mg total) by mouth every 8 (eight) hours as needed for severe pain. (Patient not taking: Reported on 08/07/2015) 40 tablet 0  . doxycycline (VIBRAMYCIN) 100 MG capsule Take  1 capsule (100 mg total) by mouth 2 (two) times daily. (Patient not taking: Reported on 08/07/2015) 20 capsule 0  . fluconazole (DIFLUCAN) 150 MG tablet Take 1 tablet (150 mg total) by mouth once. And repeat dose in 3 days for vaginal yeast infection (Patient not taking: Reported on 08/07/2015) 2 tablet 0  . glipiZIDE (GLIPIZIDE XL) 10 MG 24 hr tablet Take 1 tablet by mouth 2 (two) times daily. Reported on 07/13/2015     No facility-administered medications prior to visit.     ROS Review of Systems  Constitutional: Negative for activity change, appetite change and fatigue.  HENT: Negative for congestion,  sinus pressure and sore throat.   Eyes: Negative for visual disturbance.  Respiratory: Negative for cough, chest tightness, shortness of breath and wheezing.   Cardiovascular: Negative for chest pain and palpitations.  Gastrointestinal: Negative for abdominal distention, abdominal pain and constipation.  Endocrine: Negative for polydipsia.  Genitourinary: Negative for dysuria and frequency.  Musculoskeletal:       See hpi  Skin: Positive for wound.  Neurological: Negative for tremors, light-headedness and numbness.  Hematological: Does not bruise/bleed easily.  Psychiatric/Behavioral: Negative for agitation and behavioral problems.    Objective:  BP (!) 187/64 (BP Location: Right Arm, Patient Position: Sitting, Cuff Size: Small)   Pulse 71   Temp 98.3 F (36.8 C) (Oral)   Ht '5\' 4"'  (1.626 m)   Wt 178 lb 6.4 oz (80.9 kg)   SpO2 100%   BMI 30.62 kg/m   BP/Weight 08/07/2015 07/25/2015 08/31/784  Systolic BP 754 492 010  Diastolic BP 64 58 77  Wt. (Lbs) 178.4 180 181.2  BMI 30.62 30.88 31.09      Physical Exam  Constitutional: She is oriented to person, place, and time. She appears well-developed and well-nourished.  Cardiovascular: Normal rate and normal heart sounds.   No murmur heard. Weak dorsalis pedis on the right; unable to palpate dorsalis pedis on the left  Pulmonary/Chest: Effort normal and breath sounds normal. She has no wheezes. She has no rales. She exhibits no tenderness.  Abdominal: Soft. Bowel sounds are normal. She exhibits no distension and no mass. There is no tenderness.  Musculoskeletal:  Left foot with edema and dusky discoloration of the toe and second toe with associated sensory loss. First webspace on the left foot with whitish discharge in keeping with fungal infection. Slightly tender to palpation around the anterior aspect of the foot  Neurological: She is alert and oriented to person, place, and time.  Skin: Skin is warm and dry.   Past Medical  History:  Diagnosis Date  . Diabetes mellitus without complication ([place])   . High cholesterol   . Hypertension     Past Surgical History:  Procedure Laterality Date  . ABDOMINAL HYSTERECTOMY        Assessment & Plan:   1. Type 2 diabetes mellitus with peripheral angiopathy (H[facility]) A1c of 7.5; not fully optimized Discontinue Janumet due to an. Kidney function and place patient on reduced dose of Januvia. Glipizide initiated. ADA diet - Glucose (CBG) - Lipid Panel - glipiZIDE (GLUCOTROL) 10 MG tablet; Take 1 tablet (10 mg total) by mouth 2 (two) times daily before a meal.  Dispense: 60 tablet; Refill: 3 - sitaGLIPtin (JANUVIA) 50 MG tablet; Take 1 tablet (50 mg total) by mouth daily.  Dispense: 30 tablet; Refill: 3  2. Essential (primary) hypertension Uncontrolled She has been out of her medications which I have refilled today - amLODipine (NORVASC) 10  MG tablet; Take 1 tablet (10 mg total) by mouth daily. Reported on 07/13/2015  Dispense: 30 tablet; Refill: 3 - aspirin 81 MG EC tablet; Take 1 tablet (81 mg total) by mouth daily. Reported on 07/13/2015  Dispense: 30 tablet; Refill: 3 - carvedilol (COREG) 3.125 MG tablet; Take 1 tablet (3.125 mg total) by mouth 2 (two) times daily with a meal. Reported on 07/03/2015  Dispense: 60 tablet; Refill: 3 - isosorbide mononitrate (IMDUR) 60 MG 24 hr tablet; Take 1 tablet (60 mg total) by mouth daily.  Dispense: 30 tablet; Refill: 3 - lisinopril (PRINIVIL,ZESTRIL) 10 MG tablet; Take 1 tablet (10 mg total) by mouth daily.  Dispense: 30 tablet; Refill: 3  3. Peripheral vascular disease ([place]) This places her at high risk for ulcers and amputation - Ambulatory referral to Orthopedic Surgery  4. Diabetic ulcer of left foot associated with type 2 diabetes mellitus ([place]) I am concerned that she has dry gangrene of the toes of her left foot and might need amputation Spoke with financial counselor to give her [facility] card approved ASAP so she can see  orthopedics - gabapentin (NEURONTIN) 300 MG capsule; Take 1 capsule (300 mg total) by mouth 3 (three) times daily.  Dispense: 90 capsule; Refill: 3 - Ambulatory referral to Orthopedic Surgery  5. AKI (acute kidney injury) ([place]) CMET ordered  6. Tinea pedis of left foot - terbinafine (LAMISIL AT) 1 % cream; Apply 1 application topically 2 (two) times daily.  Dispense: 30 g; Refill: 0  7. Other diabetic neurological complication associated with type 2 diabetes mellitus ([place]) Will need to be placed on Neurontin at her next visit  8. Hyperlipidemia - atorvastatin (LIPITOR) 40 MG tablet; Take 1 tablet (40 mg total) by mouth daily. Reported on 07/13/2015  Dispense: 30 tablet; Refill: 3  9. Menopausal symptoms We have discussed her cardiovascular risk with her and the risk for breast cancer given her long-term use. I have educated her for the plan is to wean her off of this - estrogens, conjugated, (PREMARIN) 0.3 MG tablet; Take 1 tablet (0.3 mg total) by mouth daily. Take daily for 21 days then do not take for 7 days.  Dispense: 30 tablet; Refill: 0   Meds ordered this encounter  Medications  . amLODipine (NORVASC) 10 MG tablet    Sig: Take 1 tablet (10 mg total) by mouth daily. Reported on 07/13/2015    Dispense:  30 tablet    Refill:  3  . aspirin 81 MG EC tablet    Sig: Take 1 tablet (81 mg total) by mouth daily. Reported on 07/13/2015    Dispense:  30 tablet    Refill:  3  . atorvastatin (LIPITOR) 40 MG tablet    Sig: Take 1 tablet (40 mg total) by mouth daily. Reported on 07/13/2015    Dispense:  30 tablet    Refill:  3  . carvedilol (COREG) 3.125 MG tablet    Sig: Take 1 tablet (3.125 mg total) by mouth 2 (two) times daily with a meal. Reported on 07/03/2015    Dispense:  60 tablet    Refill:  3  . gabapentin (NEURONTIN) 300 MG capsule    Sig: Take 1 capsule (300 mg total) by mouth 3 (three) times daily.    Dispense:  90 capsule    Refill:  3  . isosorbide mononitrate (IMDUR) 60 MG  24 hr tablet    Sig: Take 1 tablet (60 mg total) by mouth daily.  Dispense:  30 tablet    Refill:  3  . lisinopril (PRINIVIL,ZESTRIL) 10 MG tablet    Sig: Take 1 tablet (10 mg total) by mouth daily.    Dispense:  30 tablet    Refill:  3  . glipiZIDE (GLUCOTROL) 10 MG tablet    Sig: Take 1 tablet (10 mg total) by mouth 2 (two) times daily before a meal.    Dispense:  60 tablet    Refill:  3  . sitaGLIPtin (JANUVIA) 50 MG tablet    Sig: Take 1 tablet (50 mg total) by mouth daily.    Dispense:  30 tablet    Refill:  3  . estrogens, conjugated, (PREMARIN) 0.3 MG tablet    Sig: Take 1 tablet (0.3 mg total) by mouth daily. Take daily for 21 days then do not take for 7 days.    Dispense:  30 tablet    Refill:  0  . terbinafine (LAMISIL AT) 1 % cream    Sig: Apply 1 application topically 2 (two) times daily.    Dispense:  30 g    Refill:  0    Follow-up:  Patient initially given a one-month follow-up but will be called in to come sooner-1 week for coordination of care and ensuring she has been to see orthopedics.  Arnoldo Morale MD

## 2015-08-08 ENCOUNTER — Telehealth: Payer: Self-pay | Admitting: Family Medicine

## 2015-08-08 ENCOUNTER — Ambulatory Visit: Payer: PRIVATE HEALTH INSURANCE | Attending: Family Medicine

## 2015-08-08 ENCOUNTER — Other Ambulatory Visit: Payer: Self-pay | Admitting: Family Medicine

## 2015-08-08 DIAGNOSIS — N179 Acute kidney failure, unspecified: Secondary | ICD-10-CM | POA: Insufficient documentation

## 2015-08-08 LAB — LIPID PANEL
CHOL/HDL RATIO: 3.2 ratio (ref <=5.0)
Cholesterol: 123 mg/dL — ABNORMAL LOW (ref 125–200)
HDL: 39 mg/dL — ABNORMAL LOW (ref >=46)
LDL CALC: 53 mg/dL (ref <130)
Triglycerides: 154 mg/dL — ABNORMAL HIGH (ref <150)
VLDL: 31 mg/dL — ABNORMAL HIGH (ref <30)

## 2015-08-08 LAB — COMPLETE METABOLIC PANEL WITH GFR
ALBUMIN: 3.9 g/dL (ref 3.6–5.1)
ALK PHOS: 91 U/L (ref 33–130)
ALT: 13 U/L (ref 6–29)
AST: 14 U/L (ref 10–35)
BILIRUBIN TOTAL: 0.3 mg/dL (ref 0.2–1.2)
BUN: 14 mg/dL (ref 7–25)
CO2: 25 mmol/L (ref 20–31)
CREATININE: 1.05 mg/dL (ref 0.50–1.05)
Calcium: 9.8 mg/dL (ref 8.6–10.4)
Chloride: 102 mmol/L (ref 98–110)
GFR, EST AFRICAN AMERICAN: 68 mL/min (ref >=60)
GFR, EST NON AFRICAN AMERICAN: 59 mL/min — AB (ref >=60)
Glucose, Bld: 217 mg/dL — ABNORMAL HIGH (ref 65–99)
Potassium: 3.9 mmol/L (ref 3.5–5.3)
Sodium: 140 mmol/L (ref 135–146)
TOTAL PROTEIN: 7.3 g/dL (ref 6.1–8.1)

## 2015-08-08 MED ORDER — ESTRADIOL-NORETHINDRONE ACET 0.5-0.1 MG PO TABS: 1.0000 | ORAL_TABLET | Freq: Every day | ORAL | 1 refills | Status: DC

## 2015-08-08 NOTE — Progress Notes (Signed)
Please inform her that the pharmacy does not have Premarin and so I have switched it to Estradiol/Norethindrone Acetate.  She also needs to schedule an appointment to see me in one week and not one month for follow-up of her left foot.

## 2015-08-08 NOTE — Telephone Encounter (Signed)
Please inform her that I would like to see her in one week for a follow-up of her left foot and not one month. I also had to substitute Premarin with Estradiol/Norethindrone as the former was not available at the pharmacy

## 2015-08-12 ENCOUNTER — Telehealth: Payer: Self-pay

## 2015-08-12 NOTE — Telephone Encounter (Signed)
LVM for patient per Dr. Jarold Song regarding the premarin cream and the f/u appt.  Writer scheduled an appt for patient on Wednesday 08/14/15.  Message left for patient regarding appt.

## 2015-08-14 ENCOUNTER — Ambulatory Visit: Payer: PRIVATE HEALTH INSURANCE | Attending: Family Medicine | Admitting: Family Medicine

## 2015-08-14 ENCOUNTER — Encounter: Payer: Self-pay | Admitting: Family Medicine

## 2015-08-14 VITALS — BP 149/67 | HR 67 | Temp 98.3°F | Ht 64.0 in | Wt 182.8 lb

## 2015-08-14 DIAGNOSIS — E11621 Type 2 diabetes mellitus with foot ulcer: Secondary | ICD-10-CM | POA: Insufficient documentation

## 2015-08-14 DIAGNOSIS — L97529 Non-pressure chronic ulcer of other part of left foot with unspecified severity: Secondary | ICD-10-CM

## 2015-08-14 DIAGNOSIS — Z79899 Other long term (current) drug therapy: Secondary | ICD-10-CM | POA: Insufficient documentation

## 2015-08-14 DIAGNOSIS — Z833 Family history of diabetes mellitus: Secondary | ICD-10-CM | POA: Insufficient documentation

## 2015-08-14 DIAGNOSIS — I739 Peripheral vascular disease, unspecified: Secondary | ICD-10-CM | POA: Insufficient documentation

## 2015-08-14 DIAGNOSIS — F17210 Nicotine dependence, cigarettes, uncomplicated: Secondary | ICD-10-CM | POA: Insufficient documentation

## 2015-08-14 DIAGNOSIS — E7800 Pure hypercholesterolemia, unspecified: Secondary | ICD-10-CM | POA: Insufficient documentation

## 2015-08-14 DIAGNOSIS — Z79891 Long term (current) use of opiate analgesic: Secondary | ICD-10-CM | POA: Insufficient documentation

## 2015-08-14 DIAGNOSIS — Z8249 Family history of ischemic heart disease and other diseases of the circulatory system: Secondary | ICD-10-CM | POA: Insufficient documentation

## 2015-08-14 DIAGNOSIS — Z7982 Long term (current) use of aspirin: Secondary | ICD-10-CM | POA: Insufficient documentation

## 2015-08-14 DIAGNOSIS — E1151 Type 2 diabetes mellitus with diabetic peripheral angiopathy without gangrene: Secondary | ICD-10-CM

## 2015-08-14 DIAGNOSIS — B353 Tinea pedis: Secondary | ICD-10-CM | POA: Insufficient documentation

## 2015-08-14 DIAGNOSIS — E1140 Type 2 diabetes mellitus with diabetic neuropathy, unspecified: Secondary | ICD-10-CM | POA: Insufficient documentation

## 2015-08-14 DIAGNOSIS — Z9119 Patient's noncompliance with other medical treatment and regimen: Secondary | ICD-10-CM | POA: Insufficient documentation

## 2015-08-14 DIAGNOSIS — I10 Essential (primary) hypertension: Secondary | ICD-10-CM | POA: Insufficient documentation

## 2015-08-14 NOTE — Patient Instructions (Signed)
Diabetes Mellitus and Food It is important for you to manage your blood sugar (glucose) level. Your blood glucose level can be greatly affected by what you eat. Eating healthier foods in the appropriate amounts throughout the day at about the same time each day will help you control your blood glucose level. It can also help slow or prevent worsening of your diabetes mellitus. Healthy eating may even help you improve the level of your blood pressure and reach or maintain a healthy weight.  General recommendations for healthful eating and cooking habits include:  Eating meals and snacks regularly. Avoid going long periods of time without eating to lose weight.  Eating a diet that consists mainly of plant-based foods, such as fruits, vegetables, nuts, legumes, and whole grains.  Using low-heat cooking methods, such as baking, instead of high-heat cooking methods, such as deep frying. Work with your dietitian to make sure you understand how to use the Nutrition Facts information on food labels. HOW CAN FOOD AFFECT ME? Carbohydrates Carbohydrates affect your blood glucose level more than any other type of food. Your dietitian will help you determine how many carbohydrates to eat at each meal and teach you how to count carbohydrates. Counting carbohydrates is important to keep your blood glucose at a healthy level, especially if you are using insulin or taking certain medicines for diabetes mellitus. Alcohol Alcohol can cause sudden decreases in blood glucose (hypoglycemia), especially if you use insulin or take certain medicines for diabetes mellitus. Hypoglycemia can be a life-threatening condition. Symptoms of hypoglycemia (sleepiness, dizziness, and disorientation) are similar to symptoms of having too much alcohol.  If your health care provider has given you approval to drink alcohol, do so in moderation and use the following guidelines:  Women should not have more than one drink per day, and men  should not have more than two drinks per day. One drink is equal to:  12 oz of beer.  5 oz of wine.  1 oz of hard liquor.  Do not drink on an empty stomach.  Keep yourself hydrated. Have water, diet soda, or unsweetened iced tea.  Regular soda, juice, and other mixers might contain a lot of carbohydrates and should be counted. WHAT FOODS ARE NOT RECOMMENDED? As you make food choices, it is important to remember that all foods are not the same. Some foods have fewer nutrients per serving than other foods, even though they might have the same number of calories or carbohydrates. It is difficult to get your body what it needs when you eat foods with fewer nutrients. Examples of foods that you should avoid that are high in calories and carbohydrates but low in nutrients include:  Trans fats (most processed foods list trans fats on the Nutrition Facts label).  Regular soda.  Juice.  Candy.  Sweets, such as cake, pie, doughnuts, and cookies.  Fried foods. WHAT FOODS CAN I EAT? Eat nutrient-rich foods, which will nourish your body and keep you healthy. The food you should eat also will depend on several factors, including:  The calories you need.  The medicines you take.  Your weight.  Your blood glucose level.  Your blood pressure level.  Your cholesterol level. You should eat a variety of foods, including:  Protein.  Lean cuts of meat.  Proteins low in saturated fats, such as fish, egg whites, and beans. Avoid processed meats.  Fruits and vegetables.  Fruits and vegetables that may help control blood glucose levels, such as apples, mangoes, and   yams.  Dairy products.  Choose fat-free or low-fat dairy products, such as milk, yogurt, and cheese.  Grains, bread, pasta, and rice.  Choose whole grain products, such as multigrain bread, whole oats, and brown rice. These foods may help control blood pressure.  Fats.  Foods containing healthful fats, such as nuts,  avocado, olive oil, canola oil, and fish. DOES EVERYONE WITH DIABETES MELLITUS HAVE THE SAME MEAL PLAN? Because every person with diabetes mellitus is different, there is not one meal plan that works for everyone. It is very important that you meet with a dietitian who will help you create a meal plan that is just right for you.   This information is not intended to replace advice given to you by your health care provider. Make sure you discuss any questions you have with your health care provider.   Document Released: 09/18/2004 Document Revised: 01/12/2014 Document Reviewed: 11/18/2012 Elsevier Interactive Patient Education 2016 [employer] Inc.  

## 2015-08-14 NOTE — Progress Notes (Signed)
CC: Follow up visit  HPI: Rebekah Johnson is a 55 y.o. female here today for a follow up visit.  Patient has past medical history of type 2 diabetes mellitus (A1c 7.5), Diabetic neuropathy, hypertension, peripheral vascular disease, hyperlipidemia, left diabetic foot ulcer.  At her last office visit she was prescribed Lamisil for tinea pedis of her webspaces which she is yet to pick up; she has however completed a course of doxycycline which was prescribed from the ED. I have referred her to orthopedics due to concerns for possible amputation and she is scheduled to see. Orthopedics tomorrow.  Patient has No headache, No chest pain, No abdominal pain - No Nausea, No new weakness tingling or numbness, No Cough - SOB.  No Known Allergies Past Medical History:  Diagnosis Date  . Diabetes mellitus without complication ([place])   . High cholesterol   . Hypertension    Current Outpatient Prescriptions on File Prior to Visit  Medication Sig Dispense Refill  . amLODipine (NORVASC) 10 MG tablet Take 1 tablet (10 mg total) by mouth daily. Reported on 07/13/2015 30 tablet 3  . aspirin 81 MG EC tablet Take 1 tablet (81 mg total) by mouth daily. Reported on 07/13/2015 30 tablet 3  . atorvastatin (LIPITOR) 40 MG tablet Take 1 tablet (40 mg total) by mouth daily. Reported on 07/13/2015 30 tablet 3  . blood glucose meter kit and supplies KIT Dispense based on patient and insurance preference. Use up to four times daily as directed. (FOR ICD-9 250.00, 250.01). 1 each 3  . carvedilol (COREG) 3.125 MG tablet Take 1 tablet (3.125 mg total) by mouth 2 (two) times daily with a meal. Reported on 07/03/2015 60 tablet 3  . Cholecalciferol (VITAMIN D-3) 1000 units CAPS Take 1 capsule by mouth daily.    . Estradiol-Norethindrone Acet 0.5-0.1 MG tablet Take 1 tablet by mouth daily. 30 tablet 1  . gabapentin (NEURONTIN) 300 MG capsule Take 1 capsule (300 mg total) by mouth 3 (three) times daily. 90 capsule 3  . gemfibrozil  (LOPID) 600 MG tablet Take 1 tablet by mouth 2 (two) times daily. Reported on 07/13/2015    . glipiZIDE (GLUCOTROL) 10 MG tablet Take 1 tablet (10 mg total) by mouth 2 (two) times daily before a meal. 60 tablet 3  . isosorbide mononitrate (IMDUR) 60 MG 24 hr tablet Take 1 tablet (60 mg total) by mouth daily. 30 tablet 3  . lisinopril (PRINIVIL,ZESTRIL) 10 MG tablet Take 1 tablet (10 mg total) by mouth daily. 30 tablet 3  . Omega-3 Fatty Acids (FISH OIL) 1000 MG CAPS Take 1 capsule by mouth daily. Reported on 07/13/2015    . oxyCODONE-acetaminophen (PERCOCET/ROXICET) 5-325 MG tablet Take 1-2 tablets by mouth every 6 (six) hours as needed. 25 tablet 0  . sitaGLIPtin (JANUVIA) 50 MG tablet Take 1 tablet (50 mg total) by mouth daily. 30 tablet 3  . terbinafine (LAMISIL AT) 1 % cream Apply 1 application topically 2 (two) times daily. (Patient not taking: Reported on 08/14/2015) 30 g 0  . traMADol (ULTRAM) 50 MG tablet Take 1 tablet (50 mg total) by mouth every 8 (eight) hours as needed for severe pain. (Patient not taking: Reported on 08/07/2015) 40 tablet 0   No current facility-administered medications on file prior to visit.    Family History  Problem Relation Age of Onset  . Diabetes Mother   . Hypertension Father    Social History   Social History  . Marital status: Married  Spouse name: N/A  . Number of children: N/A  . Years of education: N/A   Occupational History  . Not on file.   Social History Main Topics  . Smoking status: Current Every Day Smoker    Packs/day: 1.00    Types: Cigarettes  . Smokeless tobacco: Never Used  . Alcohol use 0.6 - 1.2 oz/week    1 - 2 Glasses of wine per week     Comment: on social occassions  . Drug use: No  . Sexual activity: Yes    Partners: Male   Other Topics Concern  . Not on file   Social History Narrative  . No narrative on file    Review of Systems: Constitutional: Negative for fever, chills, diaphoresis, activity change, appetite  change and fatigue. HENT: Negative for ear pain, nosebleeds, congestion, facial swelling, rhinorrhea, neck pain, neck stiffness and ear discharge.  Eyes: Negative for pain, discharge, redness, itching and visual disturbance. Respiratory: Negative for cough, choking, chest tightness, shortness of breath, wheezing and stridor.  Cardiovascular: Negative for chest pain, palpitations and leg swelling. Gastrointestinal: Negative for abdominal distention. Genitourinary: Negative for dysuria, urgency, frequency, hematuria, flank pain, decreased urine volume, difficulty urinating and dyspareunia.  Musculoskeletal: Negative for back pain, joint swelling, arthralgias and gait problem. Neurological: Negative for dizziness, tremors, seizures, syncope, facial asymmetry, speech difficulty, weakness, light-headedness, numbness and headaches.  Skin: See history of present illness Hematological: Negative for adenopathy. Does not bruise/bleed easily. Psychiatric/Behavioral: Negative for hallucinations, behavioral problems, confusion, dysphoric mood, decreased concentration and agitation.    Objective:   Vitals:   08/14/15 0915  BP: (!) 149/67  Pulse: 67  Temp: 98.3 F (36.8 C)    Physical Exam: Constitutional: She is oriented to person, place, and time. She appears well-developed and well-nourished.  Cardiovascular: Normal rate and normal heart sounds.   No murmur heard. Weak dorsalis pedis on the right; unable to palpate dorsalis pedis on the left  Pulmonary/Chest: Effort normal and breath sounds normal. She has no wheezes. She has no rales. She exhibits no tenderness.  Abdominal: Soft. Bowel sounds are normal. She exhibits no distension and no mass. There is no tenderness.  Musculoskeletal:  Left foot with edema and dusky discoloration of the toe and second toe with associated sensory loss. First webspace on the left foot weeping and extending to anterior aspect of base of second toe with whitish  discharge in keeping with fungal infection. Slightly tender to palpation around the anterior aspect of the foot  Neurological: She is alert and oriented to person, place, and time.  Skin: Skin is warm and dry.    Lab Results  Component Value Date   WBC 11.8 (H) 07/25/2015   HGB 11.7 (L) 07/25/2015   HCT 35.1 (L) 07/25/2015   MCV 81.6 07/25/2015   PLT 384 07/25/2015   Lab Results  Component Value Date   CREATININE 1.05 08/07/2015   BUN 14 08/07/2015   NA 140 08/07/2015   K 3.9 08/07/2015   CL 102 08/07/2015   CO2 25 08/07/2015    Lab Results  Component Value Date   HGBA1C 7.5 07/19/2015   Lipid Panel     Component Value Date/Time   CHOL 123 (L) 08/07/2015 1056   TRIG 154 (H) 08/07/2015 1056   HDL 39 (L) 08/07/2015 1056   CHOLHDL 3.2 08/07/2015 1056   VLDL 31 (H) 08/07/2015 1056   LDLCALC 53 08/07/2015 1056       Assessment and plan:   Type  2 Diabetes Mellitus: Controlled with A1c of 7.5 Continue medications  Hypertension: Slightly above goal of <140/90 Will reassess at next office visit and if still elevated will adjust regimen.  Diabetic foot ulcer with peripheral angiopathy Non compliant with prescribed medications Advised against non compliance as she is at a high risk for amputation Scheduled to see Orthopedics tomorrow   Arnoldo Morale, [facility] [phone] 08/14/2015, 9:51 AM

## 2015-08-15 ENCOUNTER — Ambulatory Visit (HOSPITAL_COMMUNITY)
Admission: RE | Admit: 2015-08-15 | Discharge: 2015-08-15 | Disposition: A | Discharge status: Home / Self Care | Payer: Self-pay | Source: Ambulatory Visit | Attending: Vascular Surgery | Admitting: Vascular Surgery

## 2015-08-15 ENCOUNTER — Ambulatory Visit (INDEPENDENT_AMBULATORY_CARE_PROVIDER_SITE_OTHER)
Admission: RE | Admit: 2015-08-15 | Discharge: 2015-08-15 | Disposition: A | Discharge status: Home / Self Care | Payer: Self-pay | Source: Ambulatory Visit | Attending: Vascular Surgery | Admitting: Vascular Surgery

## 2015-08-15 ENCOUNTER — Other Ambulatory Visit: Payer: Self-pay

## 2015-08-15 ENCOUNTER — Ambulatory Visit (INDEPENDENT_AMBULATORY_CARE_PROVIDER_SITE_OTHER): Payer: Self-pay | Admitting: Vascular Surgery

## 2015-08-15 ENCOUNTER — Other Ambulatory Visit: Payer: Self-pay | Admitting: Vascular Surgery

## 2015-08-15 ENCOUNTER — Encounter: Payer: Self-pay | Admitting: Vascular Surgery

## 2015-08-15 VITALS — BP 213/93 | HR 78 | Temp 97.8°F | Resp 16 | Ht 64.0 in | Wt 182.0 lb

## 2015-08-15 DIAGNOSIS — E7800 Pure hypercholesterolemia, unspecified: Secondary | ICD-10-CM | POA: Insufficient documentation

## 2015-08-15 DIAGNOSIS — I70262 Atherosclerosis of native arteries of extremities with gangrene, left leg: Secondary | ICD-10-CM

## 2015-08-15 DIAGNOSIS — I998 Other disorder of circulatory system: Secondary | ICD-10-CM

## 2015-08-15 DIAGNOSIS — Z01810 Encounter for preprocedural cardiovascular examination: Secondary | ICD-10-CM | POA: Insufficient documentation

## 2015-08-15 DIAGNOSIS — E119 Type 2 diabetes mellitus without complications: Secondary | ICD-10-CM | POA: Insufficient documentation

## 2015-08-15 DIAGNOSIS — I10 Essential (primary) hypertension: Secondary | ICD-10-CM | POA: Insufficient documentation

## 2015-08-15 NOTE — Progress Notes (Addendum)
Patient name: Rebekah Johnson MRN: [id number] DOB: 11-29-1960 Sex: female  REASON FOR CONSULT: Gangrene left foot with peripheral vascular disease. Referred by Dr. [name].  HPI: Rebekah Johnson is a 55 y.o. female, who was sent to our office with a note from Dr. Jess Barters office stating that she needed an urgent vascular consultation. This patient developed drainage and swelling in her left foot are proximally 2 months ago which she attributed to an ingrown toenail. Prior to this she was having bilateral lower extremity claudication which was mostly limited to the calves. I do not get any clear-cut history of rest pain or previous nonhealing ulcers. He denies any history of fever or chills.  Her risk factors for peripheral vascular disease include diabetes, hypertension, hypercholesterolemia, and tobacco abuse. She smokes 1 pack per day and is smoking for 40 years.  Her blood pressure has been poorly controlled.  Past Medical History:  Diagnosis Date  . Diabetes mellitus without complication ([place])   . High cholesterol   . Hypertension     Family History  Problem Relation Age of Onset  . Diabetes Mother   . Hypertension Father   There is no family history of premature cardiovascular disease.  SOCIAL HISTORY: Social History   Social History  . Marital status: Married    Spouse name: N/A  . Number of children: N/A  . Years of education: N/A   Occupational History  . Not on file.   Social History Main Topics  . Smoking status: Current Every Day Smoker    Packs/day: 1.00    Types: Cigarettes  . Smokeless tobacco: Never Used  . Alcohol use 0.6 - 1.2 oz/week    1 - 2 Glasses of wine per week     Comment: on social occassions  . Drug use: No  . Sexual activity: Yes    Partners: Male   Other Topics Concern  . Not on file   Social History Narrative  . No narrative on file    No Known Allergies  Current Outpatient Prescriptions  Medication Sig Dispense Refill  .  amLODipine (NORVASC) 10 MG tablet Take 1 tablet (10 mg total) by mouth daily. Reported on 07/13/2015 30 tablet 3  . aspirin 81 MG EC tablet Take 1 tablet (81 mg total) by mouth daily. Reported on 07/13/2015 30 tablet 3  . atorvastatin (LIPITOR) 40 MG tablet Take 1 tablet (40 mg total) by mouth daily. Reported on 07/13/2015 30 tablet 3  . blood glucose meter kit and supplies KIT Dispense based on patient and insurance preference. Use up to four times daily as directed. (FOR ICD-9 250.00, 250.01). 1 each 3  . carvedilol (COREG) 3.125 MG tablet Take 1 tablet (3.125 mg total) by mouth 2 (two) times daily with a meal. Reported on 07/03/2015 60 tablet 3  . Cholecalciferol (VITAMIN D-3) 1000 units CAPS Take 1 capsule by mouth daily.    . Estradiol-Norethindrone Acet 0.5-0.1 MG tablet Take 1 tablet by mouth daily. 30 tablet 1  . gabapentin (NEURONTIN) 300 MG capsule Take 1 capsule (300 mg total) by mouth 3 (three) times daily. 90 capsule 3  . gemfibrozil (LOPID) 600 MG tablet Take 1 tablet by mouth 2 (two) times daily. Reported on 07/13/2015    . glipiZIDE (GLUCOTROL) 10 MG tablet Take 1 tablet (10 mg total) by mouth 2 (two) times daily before a meal. 60 tablet 3  . isosorbide mononitrate (IMDUR) 60 MG 24 hr tablet Take 1 tablet (60 mg total)  by mouth daily. 30 tablet 3  . lisinopril (PRINIVIL,ZESTRIL) 10 MG tablet Take 1 tablet (10 mg total) by mouth daily. 30 tablet 3  . Omega-3 Fatty Acids (FISH OIL) 1000 MG CAPS Take 1 capsule by mouth daily. Reported on 07/13/2015    . oxyCODONE-acetaminophen (PERCOCET/ROXICET) 5-325 MG tablet Take 1-2 tablets by mouth every 6 (six) hours as needed. 25 tablet 0  . sitaGLIPtin (JANUVIA) 50 MG tablet Take 1 tablet (50 mg total) by mouth daily. 30 tablet 3  . terbinafine (LAMISIL AT) 1 % cream Apply 1 application topically 2 (two) times daily. 30 g 0  . traMADol (ULTRAM) 50 MG tablet Take 1 tablet (50 mg total) by mouth every 8 (eight) hours as needed for severe pain. (Patient not  taking: Reported on 08/15/2015) 40 tablet 0   No current facility-administered medications for this visit.     REVIEW OF SYSTEMS:  '[X]'  denotes positive finding, '[ ]'  denotes negative finding Cardiac  Comments:  Chest pain or chest pressure:    Shortness of breath upon exertion:    Short of breath when lying flat:    Irregular heart rhythm:        Vascular    Pain in calf, thigh, or hip brought on by ambulation: X Bilateral lower extremities  Pain in feet at night that wakes you up from your sleep:  X Left foot  Blood clot in your veins:    Leg swelling:  X Left foot      Pulmonary    Oxygen at home:    Productive cough:     Wheezing:         Neurologic    Sudden weakness in arms or legs:     Sudden numbness in arms or legs:     Sudden onset of difficulty speaking or slurred speech:    Temporary loss of vision in one eye:     Problems with dizziness:         Gastrointestinal    Blood in stool:     Vomited blood:         Genitourinary    Burning when urinating:     Blood in urine:        Psychiatric    Major depression:         Hematologic    Bleeding problems:    Problems with blood clotting too easily:        Skin    Rashes or ulcers:        Constitutional    Fever or chills:      PHYSICAL EXAM: Vitals:   08/15/15 1147 08/15/15 1155  BP: (!) 217/91 (!) 213/93  Pulse: 77 78  Resp: 16   Temp: 97.8 F (36.6 C)   SpO2: 100%   Weight: 182 lb (82.6 kg)   Height: '5\' 4"'  (1.626 m)     GENERAL: The patient is a well-nourished female, in no acute distress. The vital signs are documented above. CARDIAC: There is a regular rate and rhythm.  VASCULAR: I do not detect carotid bruits. She has palpable femoral pulses bilaterally. I cannot palpate popliteal or pedal pulses. The swelling in her left foot. PULMONARY: There is good air exchange bilaterally without wheezing or rales. ABDOMEN: Soft and non-tender with normal pitched bowel sounds.  MUSCULOSKELETAL:  There are no major deformities or cyanosis. NEUROLOGIC: No focal weakness or paresthesias are detected. SKIN: She has an open wound at the base of her left second toe  with moderate swelling. PSYCHIATRIC: The patient has a normal affect.  DATA:   ARTERIAL DOPPLER STUDY: I have independently interpreted her arterial Doppler study that was done today.  On the left side, which is the symptom [place] side, there is a monophasic posterior tibial signal and anterior tibial signal with the Doppler. ABI is 32%.  On the right side, there is a monophasic posterior tibial signal and peroneal signal. There is no anterior tibial signal on the right. ABI is 74%.  SAPHENOUS VEIN MAP:  In anticipation of possible bypass we did obtain a saphenous vein mapping today. The diameters of the left great saphenous vein range from 0.3-0.39 cm. The right saphenous vein is very small and not usable.  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 She hasn't dampened monophasic Doppler signals in the left foot with an ABI of 32%. This is clearly a limb threatening situation. She has reportedly been started on antibiotics by Dr. Sharol Given. I have scheduled an arteriogram tomorrow with Dr. Oneida Alar. I have reviewed with the patient the indications for arteriography. In addition, I have reviewed the potential complications of arteriography including but not limited to: Bleeding, arterial injury, arterial thrombosis, dye action, renal insufficiency, or other unpredictable medical problems. I have explained to the patient that if we find disease amenable to angioplasty we could potentially address this at the same time. I have discussed the potential complications of angioplasty and stenting, including but not limited to: Bleeding, arterial thrombosis, arterial  injury, dissection, or the need for surgical intervention.  If she is not a candidate for an endovascular approach then I have tentatively scheduled her for bypass surgery on 08/20/2015. I have reviewed the indications for lower extremity bypass. I have also reviewed the potential complications of surgery including but not limited to: wound healing problems, infection, graft thrombosis, limb loss, or other unpredictable medical problems. All the patient's questions were answered. I will make further recommendations pending the results of the arteriogram tomorrow.    HYPERTENSION: The patient's initial blood pressure today was elevated. We repeated this and this was still elevated. We have encouraged the patient to follow up with their primary care physician for management of their blood pressure. She did not take her blood pressure medicine this morning and this may be why her blood pressure so high. She will call her primary care physician to check on this also as I have explained that her surgery would be canceled if she came in and her blood pressure was not adequately controlled.   Deitra Mayo [facility] [phone]

## 2015-08-16 ENCOUNTER — Other Ambulatory Visit: Payer: Self-pay | Admitting: *Deleted

## 2015-08-16 ENCOUNTER — Encounter (HOSPITAL_COMMUNITY)
Admission: RE | Disposition: A | Discharge status: Home / Self Care | Payer: Self-pay | Source: Ambulatory Visit | Attending: Vascular Surgery

## 2015-08-16 ENCOUNTER — Ambulatory Visit (HOSPITAL_COMMUNITY)
Admission: RE | Admit: 2015-08-16 | Discharge: 2015-08-16 | Disposition: A | Discharge status: Home / Self Care | Payer: Self-pay | Source: Ambulatory Visit | Attending: Vascular Surgery | Admitting: Vascular Surgery

## 2015-08-16 ENCOUNTER — Other Ambulatory Visit: Payer: Self-pay

## 2015-08-16 DIAGNOSIS — I10 Essential (primary) hypertension: Secondary | ICD-10-CM | POA: Insufficient documentation

## 2015-08-16 DIAGNOSIS — F17210 Nicotine dependence, cigarettes, uncomplicated: Secondary | ICD-10-CM | POA: Insufficient documentation

## 2015-08-16 DIAGNOSIS — I70262 Atherosclerosis of native arteries of extremities with gangrene, left leg: Secondary | ICD-10-CM | POA: Insufficient documentation

## 2015-08-16 DIAGNOSIS — I70245 Atherosclerosis of native arteries of left leg with ulceration of other part of foot: Secondary | ICD-10-CM | POA: Insufficient documentation

## 2015-08-16 DIAGNOSIS — I739 Peripheral vascular disease, unspecified: Secondary | ICD-10-CM

## 2015-08-16 DIAGNOSIS — E7800 Pure hypercholesterolemia, unspecified: Secondary | ICD-10-CM | POA: Insufficient documentation

## 2015-08-16 DIAGNOSIS — E119 Type 2 diabetes mellitus without complications: Secondary | ICD-10-CM | POA: Insufficient documentation

## 2015-08-16 DIAGNOSIS — I96 Gangrene, not elsewhere classified: Secondary | ICD-10-CM

## 2015-08-16 DIAGNOSIS — L97529 Non-pressure chronic ulcer of other part of left foot with unspecified severity: Secondary | ICD-10-CM | POA: Insufficient documentation

## 2015-08-16 DIAGNOSIS — Z7982 Long term (current) use of aspirin: Secondary | ICD-10-CM | POA: Insufficient documentation

## 2015-08-16 HISTORY — PX: PERIPHERAL VASCULAR CATHETERIZATION: SHX172C

## 2015-08-16 LAB — POCT I-STAT, CHEM 8
BUN: 12 mg/dL (ref 6–20)
CALCIUM ION: 1.21 mmol/L (ref 1.13–1.30)
CREATININE: 1 mg/dL (ref 0.44–1.00)
Chloride: 100 mmol/L — ABNORMAL LOW (ref 101–111)
Glucose, Bld: 158 mg/dL — ABNORMAL HIGH (ref 65–99)
HEMATOCRIT: 38 % (ref 36.0–46.0)
HEMOGLOBIN: 12.9 g/dL (ref 12.0–15.0)
Potassium: 3.3 mmol/L — ABNORMAL LOW (ref 3.5–5.1)
SODIUM: 142 mmol/L (ref 135–145)
TCO2: 28 mmol/L (ref 0–100)

## 2015-08-16 LAB — GLUCOSE, CAPILLARY
Glucose-Capillary: 171 mg/dL — ABNORMAL HIGH (ref 65–99)
Glucose-Capillary: 150 mg/dL — ABNORMAL HIGH (ref 65–99)

## 2015-08-16 LAB — POCT ACTIVATED CLOTTING TIME
ACTIVATED CLOTTING TIME: 191 s
Activated Clotting Time: 213 seconds
Activated Clotting Time: 175 seconds

## 2015-08-16 SURGERY — ABDOMINAL AORTOGRAM

## 2015-08-16 MED ORDER — GUAIFENESIN-DM 100-10 MG/5ML PO SYRP: 15.0000 mL | ORAL_SOLUTION | ORAL | Status: DC | PRN

## 2015-08-16 MED ORDER — SODIUM CHLORIDE 0.9 % IV SOLN: 500.0000 mL | Freq: Once | INTRAVENOUS | Status: DC | PRN

## 2015-08-16 MED ORDER — PHENOL 1.4 % MT LIQD: 1.0000 | OROMUCOSAL | Status: DC | PRN

## 2015-08-16 MED ORDER — ONDANSETRON HCL 4 MG/2ML IJ SOLN: 4.0000 mg | Freq: Four times a day (QID) | INTRAMUSCULAR | Status: DC | PRN

## 2015-08-16 MED ORDER — CLOPIDOGREL BISULFATE 300 MG PO TABS
ORAL_TABLET | ORAL | Status: DC | PRN
  Administered 2015-08-16: 300 mg via ORAL

## 2015-08-16 MED ORDER — ALUM & MAG HYDROXIDE-SIMETH 200-200-20 MG/5ML PO SUSP: 15.0000 mL | ORAL | Status: DC | PRN

## 2015-08-16 MED ORDER — CLOPIDOGREL BISULFATE 75 MG PO TABS: 75.0000 mg | ORAL_TABLET | Freq: Every day | ORAL | 11 refills | Status: DC

## 2015-08-16 MED ORDER — HYDRALAZINE HCL 20 MG/ML IJ SOLN
INTRAMUSCULAR | Status: AC
  Filled 2015-08-16: qty 1

## 2015-08-16 MED ORDER — HEPARIN SODIUM (PORCINE) 1000 UNIT/ML IJ SOLN
INTRAMUSCULAR | Status: AC
  Filled 2015-08-16: qty 1

## 2015-08-16 MED ORDER — CLOPIDOGREL BISULFATE 300 MG PO TABS
ORAL_TABLET | ORAL | Status: AC
  Filled 2015-08-16: qty 1

## 2015-08-16 MED ORDER — CLOPIDOGREL BISULFATE 75 MG PO TABS: 75.0000 mg | ORAL_TABLET | Freq: Every day | ORAL | Status: DC

## 2015-08-16 MED ORDER — HEPARIN (PORCINE) IN NACL 2-0.9 UNIT/ML-% IJ SOLN
INTRAMUSCULAR | Status: DC | PRN
  Administered 2015-08-16: 1000 mL

## 2015-08-16 MED ORDER — IODIXANOL 320 MG/ML IV SOLN
INTRAVENOUS | Status: DC | PRN
  Administered 2015-08-16: 250 mL via INTRAVENOUS

## 2015-08-16 MED ORDER — METOPROLOL TARTRATE 5 MG/5ML IV SOLN: 2.0000 mg | INTRAVENOUS | Status: DC | PRN

## 2015-08-16 MED ORDER — ACETAMINOPHEN 325 MG RE SUPP: 325.0000 mg | RECTAL | Status: DC | PRN

## 2015-08-16 MED ORDER — ACETAMINOPHEN 325 MG PO TABS
325.0000 mg | ORAL_TABLET | ORAL | Status: DC | PRN
  Administered 2015-08-16: 650 mg via ORAL

## 2015-08-16 MED ORDER — LIDOCAINE HCL (PF) 1 % IJ SOLN
INTRAMUSCULAR | Status: DC | PRN
  Administered 2015-08-16: 13 mL

## 2015-08-16 MED ORDER — ACETAMINOPHEN 325 MG PO TABS
ORAL_TABLET | ORAL | Status: AC
  Filled 2015-08-16: qty 2

## 2015-08-16 MED ORDER — CLOPIDOGREL BISULFATE 300 MG PO TABS: 300.0000 mg | ORAL_TABLET | Freq: Once | ORAL | Status: DC

## 2015-08-16 MED ORDER — SODIUM CHLORIDE 0.9 % IV SOLN: INTRAVENOUS | Status: DC

## 2015-08-16 MED ORDER — SODIUM CHLORIDE 0.45 % IV SOLN
INTRAVENOUS | Status: DC
  Administered 2015-08-16: 12:00:00 via INTRAVENOUS

## 2015-08-16 MED ORDER — MORPHINE SULFATE (PF) 2 MG/ML IV SOLN
INTRAVENOUS | Status: AC
  Filled 2015-08-16: qty 1

## 2015-08-16 MED ORDER — LABETALOL HCL 5 MG/ML IV SOLN
10.0000 mg | INTRAVENOUS | Status: DC | PRN
  Administered 2015-08-16: 10 mg via INTRAVENOUS

## 2015-08-16 MED ORDER — LABETALOL HCL 5 MG/ML IV SOLN
INTRAVENOUS | Status: AC
  Filled 2015-08-16: qty 4

## 2015-08-16 MED ORDER — MORPHINE SULFATE (PF) 10 MG/ML IV SOLN
2.0000 mg | INTRAVENOUS | Status: DC | PRN
  Administered 2015-08-16 (×2): 2 mg via INTRAVENOUS

## 2015-08-16 MED ORDER — HYDRALAZINE HCL 20 MG/ML IJ SOLN
5.0000 mg | INTRAMUSCULAR | Status: AC | PRN
  Administered 2015-08-16 (×2): 5 mg via INTRAVENOUS

## 2015-08-16 MED ORDER — HEPARIN SODIUM (PORCINE) 1000 UNIT/ML IJ SOLN
INTRAMUSCULAR | Status: DC | PRN
  Administered 2015-08-16: 7000 [IU] via INTRAVENOUS
  Administered 2015-08-16: 3000 [IU] via INTRAVENOUS

## 2015-08-16 SURGICAL SUPPLY — 27 items
BALLN ARMADA 35LL 4X250X135 (BALLOONS) ×4
BALLN ARMADA 35LL 5X150X135 (BALLOONS) ×4
BALLN ARMADA 35LL 6X250X135 (BALLOONS) ×4
BALLN LUTONIX 5X150X130 (BALLOONS) ×4
BALLOON ARMADA 35LL 4X250X135 (BALLOONS) ×3 IMPLANT
BALLOON ARMADA 35LL 5X150X135 (BALLOONS) ×3 IMPLANT
BALLOON ARMADA 35LL 6X250X135 (BALLOONS) ×3 IMPLANT
BALLOON LUTONIX 5X150X130 (BALLOONS) ×3 IMPLANT
CATH CROSS OVER TEMPO 5F (CATHETERS) ×4 IMPLANT
CATH OMNI FLUSH 5F 65CM (CATHETERS) ×4 IMPLANT
CATH TEMPO AQUA 5F 100CM (CATHETERS) ×4 IMPLANT
COVER PRB 48X5XTLSCP FOLD TPE (BAG) ×3 IMPLANT
COVER PROBE 5X48 (BAG) ×1
DEVICE CONTINUOUS FLUSH (MISCELLANEOUS) ×4 IMPLANT
GUIDEWIRE ANGLED .035X260CM (WIRE) ×4 IMPLANT
KIT ENCORE 26 ADVANTAGE (KITS) ×4 IMPLANT
KIT PV (KITS) ×4 IMPLANT
SHEATH PINNACLE 5F 10CM (SHEATH) ×4 IMPLANT
SHEATH PINNACLE ST 7F 45CM (SHEATH) ×4 IMPLANT
STENT INNOVA 6X150X130 (Permanent Stent) ×4 IMPLANT
STENT INNOVA 6X200X130 (Permanent Stent) ×4 IMPLANT
SYR MEDRAD MARK V 150ML (SYRINGE) ×4 IMPLANT
TAPE RADIOPAQUE TURBO (MISCELLANEOUS) ×4 IMPLANT
TRANSDUCER W/STOPCOCK (MISCELLANEOUS) ×4 IMPLANT
TRAY PV CATH (CUSTOM PROCEDURE TRAY) ×4 IMPLANT
WIRE HITORQ VERSACORE ST 145CM (WIRE) ×4 IMPLANT
WIRE VERSACORE LOC 115CM (WIRE) ×4 IMPLANT

## 2015-08-16 NOTE — Interval H&P Note (Signed)
History and Physical Interval Note:  08/16/2015 8:24 AM  Rebekah Johnson  has presented today for surgery, with the diagnosis of grangren left foot  The various methods of treatment have been discussed with the patient and family. After consideration of risks, benefits and other options for treatment, the patient has consented to  Procedure(s): Abdominal Aortogram w/Lower Extremity (N/A) as a surgical intervention .  The patient's history has been reviewed, patient examined, no change in status, stable for surgery.  I have reviewed the patient's chart and labs.  Questions were answered to the patient's satisfaction.     Ruta Hinds

## 2015-08-16 NOTE — H&P (View-Only) (Signed)
Patient name: Rebekah Johnson MRN: [id number] DOB: 1960-10-19 Sex: female  REASON FOR CONSULT: Gangrene left foot with peripheral vascular disease. Referred by Dr. [name].  HPI: Rebekah Johnson is a 55 y.o. female, who was sent to our office with a note from Dr. Jess Barters office stating that she needed an urgent vascular consultation. This patient developed drainage and swelling in her left foot are proximally 2 months ago which she attributed to an ingrown toenail. Prior to this she was having bilateral lower extremity claudication which was mostly limited to the calves. I do not get any clear-cut history of rest pain or previous nonhealing ulcers. He denies any history of fever or chills.  Her risk factors for peripheral vascular disease include diabetes, hypertension, hypercholesterolemia, and tobacco abuse. She smokes 1 pack per day and is smoking for 40 years.  Her blood pressure has been poorly controlled.  Past Medical History:  Diagnosis Date  . Diabetes mellitus without complication ([place])   . High cholesterol   . Hypertension     Family History  Problem Relation Age of Onset  . Diabetes Mother   . Hypertension Father   There is no family history of premature cardiovascular disease.  SOCIAL HISTORY: Social History   Social History  . Marital status: Married    Spouse name: N/A  . Number of children: N/A  . Years of education: N/A   Occupational History  . Not on file.   Social History Main Topics  . Smoking status: Current Every Day Smoker    Packs/day: 1.00    Types: Cigarettes  . Smokeless tobacco: Never Used  . Alcohol use 0.6 - 1.2 oz/week    1 - 2 Glasses of wine per week     Comment: on social occassions  . Drug use: No  . Sexual activity: Yes    Partners: Male   Other Topics Concern  . Not on file   Social History Narrative  . No narrative on file    No Known Allergies  Current Outpatient Prescriptions  Medication Sig Dispense Refill  .  amLODipine (NORVASC) 10 MG tablet Take 1 tablet (10 mg total) by mouth daily. Reported on 07/13/2015 30 tablet 3  . aspirin 81 MG EC tablet Take 1 tablet (81 mg total) by mouth daily. Reported on 07/13/2015 30 tablet 3  . atorvastatin (LIPITOR) 40 MG tablet Take 1 tablet (40 mg total) by mouth daily. Reported on 07/13/2015 30 tablet 3  . blood glucose meter kit and supplies KIT Dispense based on patient and insurance preference. Use up to four times daily as directed. (FOR ICD-9 250.00, 250.01). 1 each 3  . carvedilol (COREG) 3.125 MG tablet Take 1 tablet (3.125 mg total) by mouth 2 (two) times daily with a meal. Reported on 07/03/2015 60 tablet 3  . Cholecalciferol (VITAMIN D-3) 1000 units CAPS Take 1 capsule by mouth daily.    . Estradiol-Norethindrone Acet 0.5-0.1 MG tablet Take 1 tablet by mouth daily. 30 tablet 1  . gabapentin (NEURONTIN) 300 MG capsule Take 1 capsule (300 mg total) by mouth 3 (three) times daily. 90 capsule 3  . gemfibrozil (LOPID) 600 MG tablet Take 1 tablet by mouth 2 (two) times daily. Reported on 07/13/2015    . glipiZIDE (GLUCOTROL) 10 MG tablet Take 1 tablet (10 mg total) by mouth 2 (two) times daily before a meal. 60 tablet 3  . isosorbide mononitrate (IMDUR) 60 MG 24 hr tablet Take 1 tablet (60 mg total)  by mouth daily. 30 tablet 3  . lisinopril (PRINIVIL,ZESTRIL) 10 MG tablet Take 1 tablet (10 mg total) by mouth daily. 30 tablet 3  . Omega-3 Fatty Acids (FISH OIL) 1000 MG CAPS Take 1 capsule by mouth daily. Reported on 07/13/2015    . oxyCODONE-acetaminophen (PERCOCET/ROXICET) 5-325 MG tablet Take 1-2 tablets by mouth every 6 (six) hours as needed. 25 tablet 0  . sitaGLIPtin (JANUVIA) 50 MG tablet Take 1 tablet (50 mg total) by mouth daily. 30 tablet 3  . terbinafine (LAMISIL AT) 1 % cream Apply 1 application topically 2 (two) times daily. 30 g 0  . traMADol (ULTRAM) 50 MG tablet Take 1 tablet (50 mg total) by mouth every 8 (eight) hours as needed for severe pain. (Patient not  taking: Reported on 08/15/2015) 40 tablet 0   No current facility-administered medications for this visit.     REVIEW OF SYSTEMS:  '[X]'  denotes positive finding, '[ ]'  denotes negative finding Cardiac  Comments:  Chest pain or chest pressure:    Shortness of breath upon exertion:    Short of breath when lying flat:    Irregular heart rhythm:        Vascular    Pain in calf, thigh, or hip brought on by ambulation: X Bilateral lower extremities  Pain in feet at night that wakes you up from your sleep:  X Left foot  Blood clot in your veins:    Leg swelling:  X Left foot      Pulmonary    Oxygen at home:    Productive cough:     Wheezing:         Neurologic    Sudden weakness in arms or legs:     Sudden numbness in arms or legs:     Sudden onset of difficulty speaking or slurred speech:    Temporary loss of vision in one eye:     Problems with dizziness:         Gastrointestinal    Blood in stool:     Vomited blood:         Genitourinary    Burning when urinating:     Blood in urine:        Psychiatric    Major depression:         Hematologic    Bleeding problems:    Problems with blood clotting too easily:        Skin    Rashes or ulcers:        Constitutional    Fever or chills:      PHYSICAL EXAM: Vitals:   08/15/15 1147 08/15/15 1155  BP: (!) 217/91 (!) 213/93  Pulse: 77 78  Resp: 16   Temp: 97.8 F (36.6 C)   SpO2: 100%   Weight: 182 lb (82.6 kg)   Height: '5\' 4"'  (1.626 m)     GENERAL: The patient is a well-nourished female, in no acute distress. The vital signs are documented above. CARDIAC: There is a regular rate and rhythm.  VASCULAR: I do not detect carotid bruits. She has palpable femoral pulses bilaterally. I cannot palpate popliteal or pedal pulses. The swelling in her left foot. PULMONARY: There is good air exchange bilaterally without wheezing or rales. ABDOMEN: Soft and non-tender with normal pitched bowel sounds.  MUSCULOSKELETAL:  There are no major deformities or cyanosis. NEUROLOGIC: No focal weakness or paresthesias are detected. SKIN: She has an open wound at the base of her left second toe  with moderate swelling. PSYCHIATRIC: The patient has a normal affect.  DATA:   ARTERIAL DOPPLER STUDY: I have independently interpreted her arterial Doppler study that was done today.  On the left side, which is the symptom [place] side, there is a monophasic posterior tibial signal and anterior tibial signal with the Doppler. ABI is 32%.  On the right side, there is a monophasic posterior tibial signal and peroneal signal. There is no anterior tibial signal on the right. ABI is 74%.  SAPHENOUS VEIN MAP:  In anticipation of possible bypass we did obtain a saphenous vein mapping today. The diameters of the left great saphenous vein range from 0.3-0.39 cm. The right saphenous vein is very small and not usable.  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 She hasn't dampened monophasic Doppler signals in the left foot with an ABI of 32%. This is clearly a limb threatening situation. She has reportedly been started on antibiotics by Dr. Sharol Given. I have scheduled an arteriogram tomorrow with Dr. Oneida Alar. I have reviewed with the patient the indications for arteriography. In addition, I have reviewed the potential complications of arteriography including but not limited to: Bleeding, arterial injury, arterial thrombosis, dye action, renal insufficiency, or other unpredictable medical problems. I have explained to the patient that if we find disease amenable to angioplasty we could potentially address this at the same time. I have discussed the potential complications of angioplasty and stenting, including but not limited to: Bleeding, arterial thrombosis, arterial  injury, dissection, or the need for surgical intervention.  If she is not a candidate for an endovascular approach then I have tentatively scheduled her for bypass surgery on 08/20/2015. I have reviewed the indications for lower extremity bypass. I have also reviewed the potential complications of surgery including but not limited to: wound healing problems, infection, graft thrombosis, limb loss, or other unpredictable medical problems. All the patient's questions were answered. I will make further recommendations pending the results of the arteriogram tomorrow.    HYPERTENSION: The patient's initial blood pressure today was elevated. We repeated this and this was still elevated. We have encouraged the patient to follow up with their primary care physician for management of their blood pressure. She did not take her blood pressure medicine this morning and this may be why her blood pressure so high. She will call her primary care physician to check on this also as I have explained that her surgery would be canceled if she came in and her blood pressure was not adequately controlled.   Deitra Mayo [facility] [phone]

## 2015-08-16 NOTE — Progress Notes (Addendum)
Site area: rfa Site Prior to Removal:  Level 0 Pressure Applied For:50 min Manual:   yes Patient Status During Pull:  stable Post Pull Site:  Level 0 Post Pull Instructions Given:  yes Post Pull Pulses Present: doppler Dressing Applied:  tegaderm Bedrest begins @ [id number] till 1745 Comments:

## 2015-08-16 NOTE — Discharge Instructions (Signed)
Angiogram, Care After °Refer to this sheet in the next few weeks. These instructions provide you with information about caring for yourself after your procedure. Your health care provider may also give you more specific instructions. Your treatment has been planned according to current medical practices, but problems sometimes occur. Call your health care provider if you have any problems or questions after your procedure. °WHAT TO EXPECT AFTER THE PROCEDURE °After your procedure, it is typical to have the following: °· Bruising at the catheter insertion site that usually fades within 1-2 weeks. °· Blood collecting in the tissue (hematoma) that may be painful to the touch. It should usually decrease in size and tenderness within 1-2 weeks. °HOME CARE INSTRUCTIONS °· Take medicines only as directed by your health care provider. °· You may shower 24-48 hours after the procedure or as directed by your health care provider. Remove the bandage (dressing) and gently wash the site with plain soap and water. Pat the area dry with a clean towel. Do not rub the site, because this may cause bleeding. °· Do not take baths, swim, or use a hot tub until your health care provider approves. °· Check your insertion site every day for redness, swelling, or drainage. °· Do not apply powder or lotion to the site. °· Do not lift over 10 lb (4.5 kg) for 5 days after your procedure or as directed by your health care provider. °· Ask your health care provider when it is okay to: °¨ Return to work or school. °¨ Resume usual physical activities or sports. °¨ Resume sexual activity. °· Do not drive home if you are discharged the same day as the procedure. Have someone else drive you. °· You may drive 24 hours after the procedure unless otherwise instructed by your health care provider. °· Do not operate machinery or power tools for 24 hours after the procedure or as directed by your health care provider. °· If your procedure was done as an  outpatient procedure, which means that you went home the same day as your procedure, a responsible adult should be with you for the first 24 hours after you arrive home. °· Keep all follow-up visits as directed by your health care provider. This is important. °SEEK MEDICAL CARE IF: °· You have a fever. °· You have chills. °· You have increased bleeding from the catheter insertion site. Hold pressure on the site.  CALL 911 °SEEK IMMEDIATE MEDICAL CARE IF: °· You have unusual pain at the catheter insertion site. °· You have redness, warmth, or swelling at the catheter insertion site. °· You have drainage (other than a small amount of blood on the dressing) from the catheter insertion site. °· The catheter insertion site is bleeding, and the bleeding does not stop after 30 minutes of holding steady pressure on the site. °· The area near or just beyond the catheter insertion site becomes pale, cool, tingly, or numb. °  °This information is not intended to replace advice given to you by your health care provider. Make sure you discuss any questions you have with your health care provider. °  °Document Released: 07/10/2004 Document Revised: 01/12/2014 Document Reviewed: 05/25/2012 °Elsevier Interactive Patient Education ©2016 [employer] ° °

## 2015-08-16 NOTE — Op Note (Addendum)
Procedure: Abdominal aortogram with bilateral lower extremity runoff, left SFA and popliteal stent, left SFA PTA with drug coated balloon  Preoperative diagnosis: Gangrene left foot  Postoperative diagnosis: Same  Anesthesia: Local  Operative findings: #1 distal left SFA popliteal occlusion recanalized stented to 0% residual stenosis                               #2 proximal left superficial femoral artery angioplasty to 5 mm with drug-coated Lutonix balloon  Operative details: After obtaining informed consent, the patient was taken the PV lab. The patient was placed supine position the Angio table. Both groins were prepped and draped in the usual sterile fashion. Local anesthesia was infiltrated over the right common femoral artery. Ultrasound was used to identify the right common femoral artery and femoral bifurcation. An introducer needle was then used to cannulate the right common femoral artery without difficulty. This was done under ultrasound guidance. An 035 versacore wire was inserted up the abdominal aorta under fluoroscopic guidance. Next a 5 French sheath was placed over the guidewire and right common femoral artery. This was thoroughly flushed with heparin saline. A 5 French omniflush catheter was then advanced over the guidewire up into the abdominal aorta and abdominal aortogram obtained in AP projection.  The left and right renal arteries are patent. The infrarenal abdominal aorta is patent. The left and right common external and internal iliac arteries are patent. Next the Omni Flush catheter was pulled down just above the aortic bifurcation and oblique views of the pelvis were performed confirming the above findings. I then proceeded the lower extremity runoff using the Omni Flush catheter  In the left lower extremity, the left common femoral artery has a mid 50% stenosis. The left profunda femoris is patent. The left superficial femoral artery is diffusely diseased with multiple  segments of 70-90% stenosis. The distal left superficial femoral artery then occludes at the adductor hiatus the popliteal artery is occluded just above the joint space. The popliteal artery is patent at the level of the joint. The below-knee popliteal artery is patent. There is three-vessel runoff to the left foot.  In the right lower extremity, there were similar findings except there was not a short segment occlusion of the right superficial femoral artery.  At this point it was decided to intervene on the left lower extremity occlusive disease. The 5 French Omni Flush catheter was exchanged over an 035 versacore wire for a 5 [place] crossover catheter. This was used to selectively catheterize the left common iliac artery. I then advanced guidewire down into the distal left external iliac artery. The 5 French sheath was then exchanged for a 7 [place] destination sheath. This was advanced all the way up into the left common femoral artery. The patient was given 7000 units of intravenous heparin. ACT was still slightly under 200 with this so an additional 3000 units of heparin was given. ACT was confirmed to be over 200 this point. Next an 035 angled Glidewire was advanced up and over the aortic bifurcation and into the left superficial femoral artery. I then advanced a 5 French straight catheter over this. The wire was refluxed back on itself and I then advanced this cross the chronic occlusion in the left distal SFA proximal popliteal. I was able to then reenter the vessel and this was confirmed with contrast injection under fluoroscopy. At this point the popliteal and superficial femoral artery was angioplastied  with a 4 mm x 250 mm angioplasty balloon to 8 atm for a minute. This was done with overlapping inflations up to the mid to proximal superficial femoral artery. I then deployed 2 overlapping Innova stents. The first stent was 6 x 200 and overlapping stent was 6 x 1 50. This extended the stent two thirds  of the way back to the SFA origin. This was then postdilated with a 6 x 2 50 mm balloon with overlapping inflations. The patient also had an 80% origin stenosis of the SFA. So I treated the proximal SFA in origin with a drug-coated balloon. The lesion was prepped with a 5 x 1 50 standard angioplasty balloon. I then inflated a 5 x 1 50, ex balloon for 3 minutes. This was then deflated. Completion arteriogram was then performed which showed wide patency of the SFA with no residual stenosis stent was also widely patent except for one segment in the midportion which had about a 40% stenosis. I re-angioplastied this to make sure that the stent was fully deployed. This probably represented an area at the adductor hiatus. The below-knee popliteal artery had some size discrepancy and potentially a small amount of narrowing below the stent but I did not feel that this was flow-limiting. There was still preserved 3 vessel runoff to the left foot. At this point the dilator was placed back in the sheath and sheath was pulled back in the right hemipelvis. The guidewire was removed. The patient tolerated the procedure well and there were no complications. The patient was taken to the holding area in stable condition. The sheath was left in place to be pulled in the holding area after the ACT is less than 175.  Ruta Hinds, MD [facility] Office: [phone] Pager: [phone]

## 2015-08-19 ENCOUNTER — Telehealth: Payer: Self-pay | Admitting: Vascular Surgery

## 2015-08-19 ENCOUNTER — Encounter (HOSPITAL_COMMUNITY): Payer: Self-pay | Admitting: Vascular Surgery

## 2015-08-19 MED FILL — Heparin Sodium (Porcine) Inj 1000 Unit/ML: INTRAMUSCULAR | Qty: 10 | Status: AC

## 2015-08-19 MED FILL — Morphine Sulfate Inj 2 MG/ML: INTRAMUSCULAR | Qty: 1 | Status: AC

## 2015-08-19 NOTE — Telephone Encounter (Signed)
Sched lab 8/21 at 1:00 and MD 8/23 at 1:15. Spoke to pt to inform them of appt.

## 2015-08-19 NOTE — Telephone Encounter (Signed)
-----   Message from Mena Goes, RN sent at 08/16/2015 12:59 PM EDT ----- Regarding: schedule 2 weeks appt and ABI    ----- Message ----- From: Elam Dutch, MD Sent: 08/16/2015  11:09 AM To: [facility] Charge Pool  Aortogram with bilateral runoff PTA of left SFA and popliteal Stent left sfa popliteal Drug coated balloon left SFA [place] groin  She needs follow up ABI and office visit with Scot Dock to recheck left foot in 2 weeks  Ruta Hinds

## 2015-08-20 ENCOUNTER — Inpatient Hospital Stay (HOSPITAL_COMMUNITY): Admission: RE | Admit: 2015-08-20 | Payer: Self-pay | Source: Ambulatory Visit | Admitting: Vascular Surgery

## 2015-08-20 ENCOUNTER — Encounter (HOSPITAL_COMMUNITY): Admission: RE | Payer: Self-pay | Source: Ambulatory Visit

## 2015-08-20 SURGERY — CREATION, BYPASS, ARTERIAL, FEMORAL TO TIBIAL, USING GRAFT
Anesthesia: General | Laterality: Left

## 2015-08-22 ENCOUNTER — Other Ambulatory Visit: Payer: Self-pay

## 2015-08-22 DIAGNOSIS — E1151 Type 2 diabetes mellitus with diabetic peripheral angiopathy without gangrene: Secondary | ICD-10-CM

## 2015-08-22 MED ORDER — SITAGLIPTIN PHOSPHATE 50 MG PO TABS: 50.0000 mg | ORAL_TABLET | Freq: Every day | ORAL | 3 refills | Status: DC

## 2015-08-26 ENCOUNTER — Encounter: Payer: Self-pay | Admitting: Vascular Surgery

## 2015-08-26 ENCOUNTER — Encounter (HOSPITAL_COMMUNITY): Payer: Self-pay

## 2015-08-28 ENCOUNTER — Encounter: Payer: Self-pay | Admitting: Vascular Surgery

## 2015-08-28 ENCOUNTER — Ambulatory Visit (INDEPENDENT_AMBULATORY_CARE_PROVIDER_SITE_OTHER): Payer: Self-pay | Admitting: Vascular Surgery

## 2015-08-28 ENCOUNTER — Ambulatory Visit (HOSPITAL_COMMUNITY)
Admission: RE | Admit: 2015-08-28 | Discharge: 2015-08-28 | Disposition: A | Discharge status: Home / Self Care | Payer: Self-pay | Source: Ambulatory Visit | Attending: Vascular Surgery | Admitting: Vascular Surgery

## 2015-08-28 ENCOUNTER — Telehealth: Payer: Self-pay | Admitting: Vascular Surgery

## 2015-08-28 VITALS — BP 131/68 | HR 79 | Temp 100.1°F | Resp 16 | Ht 64.0 in | Wt 182.0 lb

## 2015-08-28 DIAGNOSIS — I739 Peripheral vascular disease, unspecified: Secondary | ICD-10-CM | POA: Insufficient documentation

## 2015-08-28 DIAGNOSIS — E7800 Pure hypercholesterolemia, unspecified: Secondary | ICD-10-CM | POA: Insufficient documentation

## 2015-08-28 DIAGNOSIS — R0989 Other specified symptoms and signs involving the circulatory and respiratory systems: Secondary | ICD-10-CM | POA: Insufficient documentation

## 2015-08-28 DIAGNOSIS — Z48812 Encounter for surgical aftercare following surgery on the circulatory system: Secondary | ICD-10-CM

## 2015-08-28 DIAGNOSIS — I10 Essential (primary) hypertension: Secondary | ICD-10-CM | POA: Insufficient documentation

## 2015-08-28 DIAGNOSIS — E119 Type 2 diabetes mellitus without complications: Secondary | ICD-10-CM | POA: Insufficient documentation

## 2015-08-28 NOTE — Progress Notes (Signed)
Patient name: Rebekah Johnson MRN: [id number] DOB: 02/19/1960 Sex: female  REASON FOR VISIT: Follow up of left foot wound.  HPI: Rebekah Johnson is a 55 y.o. female who I had seen in consultation on 08/15/2015 with gangrene of the left foot and peripheral vascular disease. She had been referred by Dr. Sharol Given. She had evidence of severe infrainguinal arterial occlusive disease with an ABI of 32%. She underwent an arteriogram by Dr. Ruta Hinds on 08/16/2015. She had a distal left superficial femoral artery and popliteal artery occlusion which was recanalized and stented with no residual stenosis. She also had a proximal left superficial from artery angioplasty with a drug-coated balloon.  She comes in for an outpatient visit. She is in much better spirits this visit. She denies fever or chills. She has had some odor from the left foot where she has dry gangrene of the left second toe. She does continue to smoke.  Current Outpatient Prescriptions  Medication Sig Dispense Refill  . amLODipine (NORVASC) 10 MG tablet Take 1 tablet (10 mg total) by mouth daily. Reported on 07/13/2015 30 tablet 3  . aspirin 81 MG EC tablet Take 1 tablet (81 mg total) by mouth daily. Reported on 07/13/2015 30 tablet 3  . atorvastatin (LIPITOR) 40 MG tablet Take 1 tablet (40 mg total) by mouth daily. Reported on 07/13/2015 30 tablet 3  . blood glucose meter kit and supplies KIT Dispense based on patient and insurance preference. Use up to four times daily as directed. (FOR ICD-9 250.00, 250.01). 1 each 3  . carvedilol (COREG) 3.125 MG tablet Take 1 tablet (3.125 mg total) by mouth 2 (two) times daily with a meal. Reported on 07/03/2015 60 tablet 3  . Cholecalciferol (VITAMIN D-3) 1000 units CAPS Take 1 capsule by mouth daily.    . clopidogrel (PLAVIX) 75 MG tablet Take 1 tablet (75 mg total) by mouth daily. 30 tablet 11  . Estradiol-Norethindrone Acet 0.5-0.1 MG tablet Take 1 tablet by mouth daily. 30 tablet 1  . gabapentin  (NEURONTIN) 300 MG capsule Take 1 capsule (300 mg total) by mouth 3 (three) times daily. 90 capsule 3  . gemfibrozil (LOPID) 600 MG tablet Take 1 tablet by mouth 2 (two) times daily. Reported on 07/13/2015    . glipiZIDE (GLUCOTROL) 10 MG tablet Take 1 tablet (10 mg total) by mouth 2 (two) times daily before a meal. 60 tablet 3  . isosorbide mononitrate (IMDUR) 60 MG 24 hr tablet Take 1 tablet (60 mg total) by mouth daily. 30 tablet 3  . lisinopril (PRINIVIL,ZESTRIL) 10 MG tablet Take 1 tablet (10 mg total) by mouth daily. 30 tablet 3  . Omega-3 Fatty Acids (FISH OIL) 1000 MG CAPS Take 1 capsule by mouth daily. Reported on 07/13/2015    . oxyCODONE-acetaminophen (PERCOCET/ROXICET) 5-325 MG tablet Take 1-2 tablets by mouth every 6 (six) hours as needed. 25 tablet 0  . sitaGLIPtin (JANUVIA) 50 MG tablet Take 1 tablet (50 mg total) by mouth daily. 90 tablet 3   No current facility-administered medications for this visit.     REVIEW OF SYSTEMS:  '[X]'  denotes positive finding, '[ ]'  denotes negative finding Cardiac  Comments:  Chest pain or chest pressure:    Shortness of breath upon exertion:    Short of breath when lying flat:    Irregular heart rhythm:    Constitutional    Fever or chills:      PHYSICAL EXAM: Vitals:   08/28/15 1329  BP: 131/68  Pulse:  79  Resp: 16  Temp: 100.1 F (37.8 C)  TempSrc: Oral  SpO2: 97%  Weight: 182 lb (82.6 kg)  Height: '5\' 4"'  (1.626 m)    GENERAL: The patient is a well-nourished female, in no acute distress. The vital signs are documented above. CARDIOVASCULAR: There is a regular rate and rhythm. PULMONARY: There is good air exchange bilaterally without wheezing or rales. The right groin site looks fine with a very small hematoma noted. She has a brisk posterior tibial signal with the Doppler on the left. The left foot is swollen with dry gangrene of the left second toe. There is some odor  MEDICAL ISSUES:  STATUS POST ANGIOPLASTY AND STENTING OF THE  LEFT SUPERFICIAL FEMORAL ARTERY AND POPLITEAL ARTERY: She has had an excellent result from a vascular standpoint from angioplasty and stenting of the left superficial femoral artery and popliteal artery by Dr. Oneida Alar. We are trying to get her seen in Dr. Jess Barters office this week to continue to follow the left foot. I have discussed with her the importance of tobacco cessation. I'll order a follow up ABIs and duplex of her left lower extremity in 3 months. I'll see her back at that time. She knows to call sooner she has problems.  Deitra Mayo [facility] [phone]

## 2015-08-28 NOTE — Telephone Encounter (Signed)
I called patient to notify her of her appt with Dr.Duda on 9.14.17 at 8:30am. She wanted to be seen sooner than that so I did call and speak with Cassandra at Dr.Duda's office and she stated she would reschedule the appt for as soon as possible and also notify the patient.  awt

## 2015-09-02 ENCOUNTER — Other Ambulatory Visit: Payer: Self-pay | Admitting: Vascular Surgery

## 2015-09-02 DIAGNOSIS — I96 Gangrene, not elsewhere classified: Secondary | ICD-10-CM

## 2015-09-02 DIAGNOSIS — I739 Peripheral vascular disease, unspecified: Secondary | ICD-10-CM

## 2015-09-05 ENCOUNTER — Other Ambulatory Visit (HOSPITAL_COMMUNITY): Payer: Self-pay | Admitting: Family

## 2015-09-05 NOTE — Progress Notes (Signed)
I called in am and left voice message that I was calling from pre- admission and asked her to call me back or told her I would call her.  I called in this evening, 1st, I did not get and answer, no voice mail, 2nd time no rings, beeped busy.

## 2015-09-06 ENCOUNTER — Ambulatory Visit (HOSPITAL_COMMUNITY): Payer: Self-pay | Admitting: Certified Registered Nurse Anesthetist

## 2015-09-06 ENCOUNTER — Encounter (HOSPITAL_COMMUNITY): Payer: Self-pay | Admitting: *Deleted

## 2015-09-06 ENCOUNTER — Encounter (HOSPITAL_COMMUNITY)
Admission: RE | Disposition: A | Discharge status: Home / Self Care | Payer: Self-pay | Source: Ambulatory Visit | Attending: Orthopedic Surgery

## 2015-09-06 ENCOUNTER — Ambulatory Visit (HOSPITAL_COMMUNITY)
Admission: RE | Admit: 2015-09-06 | Discharge: 2015-09-07 | Disposition: A | Discharge status: Home / Self Care | Payer: Self-pay | Source: Ambulatory Visit | Attending: Orthopedic Surgery | Admitting: Orthopedic Surgery

## 2015-09-06 DIAGNOSIS — E1142 Type 2 diabetes mellitus with diabetic polyneuropathy: Secondary | ICD-10-CM | POA: Insufficient documentation

## 2015-09-06 DIAGNOSIS — E7800 Pure hypercholesterolemia, unspecified: Secondary | ICD-10-CM | POA: Insufficient documentation

## 2015-09-06 DIAGNOSIS — Z89432 Acquired absence of left foot: Secondary | ICD-10-CM

## 2015-09-06 DIAGNOSIS — F17210 Nicotine dependence, cigarettes, uncomplicated: Secondary | ICD-10-CM | POA: Insufficient documentation

## 2015-09-06 DIAGNOSIS — Z7902 Long term (current) use of antithrombotics/antiplatelets: Secondary | ICD-10-CM | POA: Insufficient documentation

## 2015-09-06 DIAGNOSIS — E1152 Type 2 diabetes mellitus with diabetic peripheral angiopathy with gangrene: Secondary | ICD-10-CM | POA: Insufficient documentation

## 2015-09-06 DIAGNOSIS — Z79899 Other long term (current) drug therapy: Secondary | ICD-10-CM | POA: Insufficient documentation

## 2015-09-06 DIAGNOSIS — Z95820 Peripheral vascular angioplasty status with implants and grafts: Secondary | ICD-10-CM | POA: Insufficient documentation

## 2015-09-06 DIAGNOSIS — Z7982 Long term (current) use of aspirin: Secondary | ICD-10-CM | POA: Insufficient documentation

## 2015-09-06 DIAGNOSIS — Z7984 Long term (current) use of oral hypoglycemic drugs: Secondary | ICD-10-CM | POA: Insufficient documentation

## 2015-09-06 DIAGNOSIS — I10 Essential (primary) hypertension: Secondary | ICD-10-CM | POA: Insufficient documentation

## 2015-09-06 DIAGNOSIS — Z79890 Hormone replacement therapy (postmenopausal): Secondary | ICD-10-CM | POA: Insufficient documentation

## 2015-09-06 HISTORY — PX: AMPUTATION: SHX166

## 2015-09-06 HISTORY — DX: Acquired absence of left foot: Z89.432

## 2015-09-06 LAB — BASIC METABOLIC PANEL
Anion gap: 8 (ref 5–15)
BUN: 8 mg/dL (ref 6–20)
CALCIUM: 9.7 mg/dL (ref 8.9–10.3)
CO2: 28 mmol/L (ref 22–32)
CREATININE: 1.06 mg/dL — AB (ref 0.44–1.00)
Chloride: 104 mmol/L (ref 101–111)
GFR calc non Af Amer: 57 mL/min — ABNORMAL LOW (ref >60)
Glucose, Bld: 105 mg/dL — ABNORMAL HIGH (ref 65–99)
Potassium: 3.7 mmol/L (ref 3.5–5.1)
SODIUM: 140 mmol/L (ref 135–145)

## 2015-09-06 LAB — GLUCOSE, CAPILLARY
GLUCOSE-CAPILLARY: 91 mg/dL (ref 65–99)
GLUCOSE-CAPILLARY: 111 mg/dL — AB (ref 65–99)
GLUCOSE-CAPILLARY: 94 mg/dL (ref 65–99)
GLUCOSE-CAPILLARY: 118 mg/dL — AB (ref 65–99)

## 2015-09-06 LAB — CBC
HCT: 36 % (ref 36.0–46.0)
Hemoglobin: 11.3 g/dL — ABNORMAL LOW (ref 12.0–15.0)
MCH: 26.2 pg (ref 26.0–34.0)
MCHC: 31.4 g/dL (ref 30.0–36.0)
MCV: 83.5 fL (ref 78.0–100.0)
PLATELETS: 401 10*3/uL — AB (ref 150–400)
RBC: 4.31 MIL/uL (ref 3.87–5.11)
RDW: 13.5 % (ref 11.5–15.5)
WBC: 10.1 10*3/uL (ref 4.0–10.5)

## 2015-09-06 SURGERY — AMPUTATION, FOOT, PARTIAL
Anesthesia: General | Site: Foot | Laterality: Left

## 2015-09-06 MED ORDER — INSULIN ASPART 100 UNIT/ML ~~LOC~~ SOLN: 0.0000 [IU] | Freq: Three times a day (TID) | SUBCUTANEOUS | Status: DC

## 2015-09-06 MED ORDER — PHENYLEPHRINE HCL 10 MG/ML IJ SOLN
INTRAMUSCULAR | Status: DC | PRN
  Administered 2015-09-06: 80 ug via INTRAVENOUS

## 2015-09-06 MED ORDER — LABETALOL HCL 5 MG/ML IV SOLN
INTRAVENOUS | Status: AC
  Filled 2015-09-06: qty 4

## 2015-09-06 MED ORDER — ISOSORBIDE MONONITRATE ER 60 MG PO TB24
60.0000 mg | ORAL_TABLET | Freq: Every day | ORAL | Status: DC
  Administered 2015-09-06 – 2015-09-07 (×2): 60 mg via ORAL
  Filled 2015-09-06 (×2): qty 1

## 2015-09-06 MED ORDER — FENTANYL CITRATE (PF) 100 MCG/2ML IJ SOLN
INTRAMUSCULAR | Status: AC
  Filled 2015-09-06: qty 2

## 2015-09-06 MED ORDER — METOCLOPRAMIDE HCL 5 MG/ML IJ SOLN: 5.0000 mg | Freq: Three times a day (TID) | INTRAMUSCULAR | Status: DC | PRN

## 2015-09-06 MED ORDER — LABETALOL HCL 5 MG/ML IV SOLN
5.0000 mg | INTRAVENOUS | Status: DC | PRN
  Administered 2015-09-06 (×2): 5 mg via INTRAVENOUS

## 2015-09-06 MED ORDER — ACETAMINOPHEN 650 MG RE SUPP
650.0000 mg | Freq: Four times a day (QID) | RECTAL | Status: DC | PRN
Start: 2015-09-06 — End: 2015-09-07

## 2015-09-06 MED ORDER — 0.9 % SODIUM CHLORIDE (POUR BTL) OPTIME
TOPICAL | Status: DC | PRN
  Administered 2015-09-06: 1000 mL

## 2015-09-06 MED ORDER — GLIPIZIDE 5 MG PO TABS
10.0000 mg | ORAL_TABLET | Freq: Two times a day (BID) | ORAL | Status: DC
  Administered 2015-09-06 – 2015-09-07 (×2): 10 mg via ORAL
  Filled 2015-09-06 (×2): qty 2

## 2015-09-06 MED ORDER — CEFAZOLIN SODIUM-DEXTROSE 2-4 GM/100ML-% IV SOLN
2.0000 g | INTRAVENOUS | Status: AC
  Administered 2015-09-06: 2 g via INTRAVENOUS

## 2015-09-06 MED ORDER — LIDOCAINE 2% (20 MG/ML) 5 ML SYRINGE
INTRAMUSCULAR | Status: DC | PRN
  Administered 2015-09-06: 80 mg via INTRAVENOUS

## 2015-09-06 MED ORDER — GEMFIBROZIL 600 MG PO TABS
600.0000 mg | ORAL_TABLET | Freq: Two times a day (BID) | ORAL | Status: DC
  Administered 2015-09-06 – 2015-09-07 (×2): 600 mg via ORAL
  Filled 2015-09-06 (×2): qty 1

## 2015-09-06 MED ORDER — MIDAZOLAM HCL 2 MG/2ML IJ SOLN
INTRAMUSCULAR | Status: AC
  Filled 2015-09-06: qty 2

## 2015-09-06 MED ORDER — AMLODIPINE BESYLATE 10 MG PO TABS
10.0000 mg | ORAL_TABLET | Freq: Every day | ORAL | Status: DC
  Administered 2015-09-06 – 2015-09-07 (×2): 10 mg via ORAL
  Filled 2015-09-06 (×2): qty 1

## 2015-09-06 MED ORDER — ONDANSETRON HCL 4 MG PO TABS: 4.0000 mg | ORAL_TABLET | Freq: Four times a day (QID) | ORAL | Status: DC | PRN

## 2015-09-06 MED ORDER — ASPIRIN 81 MG PO TBEC: 81.0000 mg | DELAYED_RELEASE_TABLET | Freq: Every day | ORAL | Status: DC

## 2015-09-06 MED ORDER — MEPERIDINE HCL 25 MG/ML IJ SOLN: 6.2500 mg | INTRAMUSCULAR | Status: DC | PRN

## 2015-09-06 MED ORDER — MIDAZOLAM HCL 5 MG/5ML IJ SOLN
INTRAMUSCULAR | Status: DC | PRN
  Administered 2015-09-06: 2 mg via INTRAVENOUS

## 2015-09-06 MED ORDER — LINAGLIPTIN 5 MG PO TABS
5.0000 mg | ORAL_TABLET | Freq: Every day | ORAL | Status: DC
  Administered 2015-09-06 – 2015-09-07 (×2): 5 mg via ORAL
  Filled 2015-09-06 (×2): qty 1

## 2015-09-06 MED ORDER — CARVEDILOL 3.125 MG PO TABS
ORAL_TABLET | ORAL | Status: AC
  Filled 2015-09-06: qty 1

## 2015-09-06 MED ORDER — ETOMIDATE 2 MG/ML IV SOLN
INTRAVENOUS | Status: AC
  Filled 2015-09-06: qty 10

## 2015-09-06 MED ORDER — SODIUM CHLORIDE 0.9 % IV SOLN: INTRAVENOUS | Status: DC

## 2015-09-06 MED ORDER — CEFAZOLIN SODIUM-DEXTROSE 2-4 GM/100ML-% IV SOLN
INTRAVENOUS | Status: AC
  Filled 2015-09-06: qty 100

## 2015-09-06 MED ORDER — METHOCARBAMOL 500 MG PO TABS
500.0000 mg | ORAL_TABLET | Freq: Four times a day (QID) | ORAL | Status: DC | PRN
  Administered 2015-09-06 – 2015-09-07 (×2): 500 mg via ORAL
  Filled 2015-09-06 (×2): qty 1

## 2015-09-06 MED ORDER — GABAPENTIN 300 MG PO CAPS
300.0000 mg | ORAL_CAPSULE | Freq: Three times a day (TID) | ORAL | Status: DC
  Administered 2015-09-06 – 2015-09-07 (×3): 300 mg via ORAL
  Filled 2015-09-06 (×3): qty 1

## 2015-09-06 MED ORDER — LABETALOL HCL 5 MG/ML IV SOLN
INTRAVENOUS | Status: AC
  Administered 2015-09-06: 5 mg via INTRAVENOUS
  Filled 2015-09-06: qty 4

## 2015-09-06 MED ORDER — ONDANSETRON HCL 4 MG/2ML IJ SOLN
INTRAMUSCULAR | Status: AC
  Filled 2015-09-06: qty 2

## 2015-09-06 MED ORDER — METHOCARBAMOL 1000 MG/10ML IJ SOLN
500.0000 mg | Freq: Four times a day (QID) | INTRAVENOUS | Status: DC | PRN
  Filled 2015-09-06: qty 5

## 2015-09-06 MED ORDER — ACETAMINOPHEN 325 MG PO TABS: 650.0000 mg | ORAL_TABLET | Freq: Four times a day (QID) | ORAL | Status: DC | PRN

## 2015-09-06 MED ORDER — LISINOPRIL 10 MG PO TABS
10.0000 mg | ORAL_TABLET | Freq: Every day | ORAL | Status: DC
  Administered 2015-09-06 – 2015-09-07 (×2): 10 mg via ORAL
  Filled 2015-09-06 (×2): qty 1

## 2015-09-06 MED ORDER — ESTRADIOL-NORETHINDRONE ACET 0.5-0.1 MG PO TABS: 1.0000 | ORAL_TABLET | Freq: Every day | ORAL | Status: DC

## 2015-09-06 MED ORDER — ONDANSETRON HCL 4 MG/2ML IJ SOLN: 4.0000 mg | Freq: Four times a day (QID) | INTRAMUSCULAR | Status: DC | PRN

## 2015-09-06 MED ORDER — CARVEDILOL 3.125 MG PO TABS
3.1250 mg | ORAL_TABLET | Freq: Two times a day (BID) | ORAL | Status: DC
  Administered 2015-09-06 – 2015-09-07 (×2): 3.125 mg via ORAL
  Filled 2015-09-06 (×2): qty 1

## 2015-09-06 MED ORDER — LACTATED RINGERS IV SOLN
INTRAVENOUS | Status: DC
  Administered 2015-09-06 (×2): via INTRAVENOUS

## 2015-09-06 MED ORDER — ASPIRIN EC 81 MG PO TBEC
81.0000 mg | DELAYED_RELEASE_TABLET | Freq: Every day | ORAL | Status: DC
  Administered 2015-09-07: 81 mg via ORAL
  Filled 2015-09-06: qty 1

## 2015-09-06 MED ORDER — CEFAZOLIN SODIUM-DEXTROSE 2-4 GM/100ML-% IV SOLN
2.0000 g | Freq: Four times a day (QID) | INTRAVENOUS | Status: AC
  Administered 2015-09-06 – 2015-09-07 (×3): 2 g via INTRAVENOUS
  Filled 2015-09-06 (×3): qty 100

## 2015-09-06 MED ORDER — LIDOCAINE 2% (20 MG/ML) 5 ML SYRINGE
INTRAMUSCULAR | Status: AC
  Filled 2015-09-06: qty 10

## 2015-09-06 MED ORDER — HYDROMORPHONE HCL 1 MG/ML IJ SOLN
1.0000 mg | INTRAMUSCULAR | Status: DC | PRN
  Administered 2015-09-06 – 2015-09-07 (×5): 1 mg via INTRAVENOUS
  Filled 2015-09-06 (×5): qty 1

## 2015-09-06 MED ORDER — PROPOFOL 10 MG/ML IV BOLUS
INTRAVENOUS | Status: DC | PRN
  Administered 2015-09-06: 110 mg via INTRAVENOUS

## 2015-09-06 MED ORDER — ATORVASTATIN CALCIUM 40 MG PO TABS
40.0000 mg | ORAL_TABLET | Freq: Every day | ORAL | Status: DC
  Administered 2015-09-06: 40 mg via ORAL
  Filled 2015-09-06: qty 1

## 2015-09-06 MED ORDER — HYDROMORPHONE HCL 1 MG/ML IJ SOLN
0.2500 mg | INTRAMUSCULAR | Status: DC | PRN
  Administered 2015-09-06: 0.5 mg via INTRAVENOUS

## 2015-09-06 MED ORDER — PROPOFOL 10 MG/ML IV BOLUS
INTRAVENOUS | Status: AC
  Filled 2015-09-06: qty 20

## 2015-09-06 MED ORDER — CLOPIDOGREL BISULFATE 75 MG PO TABS
75.0000 mg | ORAL_TABLET | Freq: Every day | ORAL | Status: DC
  Administered 2015-09-06 – 2015-09-07 (×2): 75 mg via ORAL
  Filled 2015-09-06 (×2): qty 1

## 2015-09-06 MED ORDER — MIDAZOLAM HCL 2 MG/2ML IJ SOLN
INTRAMUSCULAR | Status: AC
Start: 2015-09-06 — End: 2015-09-07
  Filled 2015-09-06: qty 2

## 2015-09-06 MED ORDER — ONDANSETRON HCL 4 MG/2ML IJ SOLN: 4.0000 mg | Freq: Once | INTRAMUSCULAR | Status: DC | PRN

## 2015-09-06 MED ORDER — CHLORHEXIDINE GLUCONATE 4 % EX LIQD
60.0000 mL | Freq: Once | CUTANEOUS | Status: DC
Start: 2015-09-06 — End: 2015-09-06

## 2015-09-06 MED ORDER — OXYCODONE HCL 5 MG PO TABS
5.0000 mg | ORAL_TABLET | ORAL | Status: DC | PRN
  Administered 2015-09-07 (×3): 10 mg via ORAL
  Filled 2015-09-06 (×3): qty 2

## 2015-09-06 MED ORDER — METOCLOPRAMIDE HCL 5 MG PO TABS: 5.0000 mg | ORAL_TABLET | Freq: Three times a day (TID) | ORAL | Status: DC | PRN

## 2015-09-06 MED ORDER — HYDROMORPHONE HCL 1 MG/ML IJ SOLN
INTRAMUSCULAR | Status: AC
  Filled 2015-09-06: qty 1

## 2015-09-06 MED ORDER — FENTANYL CITRATE (PF) 100 MCG/2ML IJ SOLN
INTRAMUSCULAR | Status: DC | PRN
  Administered 2015-09-06: 25 ug via INTRAVENOUS
  Administered 2015-09-06: 100 ug via INTRAVENOUS
  Administered 2015-09-06: 25 ug via INTRAVENOUS
  Administered 2015-09-06: 50 ug via INTRAVENOUS

## 2015-09-06 MED ORDER — ONDANSETRON HCL 4 MG/2ML IJ SOLN
INTRAMUSCULAR | Status: DC | PRN
  Administered 2015-09-06: 4 mg via INTRAVENOUS

## 2015-09-06 MED ORDER — CARVEDILOL 3.125 MG PO TABS
3.1250 mg | ORAL_TABLET | Freq: Once | ORAL | Status: AC
  Administered 2015-09-06: 3.125 mg via ORAL
  Filled 2015-09-06: qty 1

## 2015-09-06 SURGICAL SUPPLY — 36 items
BLADE SAW SGTL HD 18.5X60.5X1. (BLADE) ×3 IMPLANT
BLADE SURG 10 STRL SS (BLADE) IMPLANT
BNDG COHESIVE 4X5 TAN STRL (GAUZE/BANDAGES/DRESSINGS) IMPLANT
BNDG GAUZE ELAST 4 BULKY (GAUZE/BANDAGES/DRESSINGS) IMPLANT
CANISTER WOUND CARE 500ML ATS (WOUND CARE) ×3 IMPLANT
COVER SURGICAL LIGHT HANDLE (MISCELLANEOUS) ×3 IMPLANT
DRAPE INCISE IOBAN 66X45 STRL (DRAPES) ×3 IMPLANT
DRAPE U-SHAPE 47X51 STRL (DRAPES) ×3 IMPLANT
DRSG ADAPTIC 3X8 NADH LF (GAUZE/BANDAGES/DRESSINGS) IMPLANT
DRSG PAD ABDOMINAL 8X10 ST (GAUZE/BANDAGES/DRESSINGS) IMPLANT
DURAPREP 26ML APPLICATOR (WOUND CARE) ×3 IMPLANT
ELECT REM PT RETURN 9FT ADLT (ELECTROSURGICAL) ×3
ELECTRODE REM PT RTRN 9FT ADLT (ELECTROSURGICAL) ×1 IMPLANT
GAUZE SPONGE 4X4 12PLY STRL (GAUZE/BANDAGES/DRESSINGS) IMPLANT
GLOVE BIOGEL PI IND STRL 6.5 (GLOVE) ×2 IMPLANT
GLOVE BIOGEL PI IND STRL 9 (GLOVE) ×1 IMPLANT
GLOVE BIOGEL PI INDICATOR 6.5 (GLOVE) ×4
GLOVE BIOGEL PI INDICATOR 9 (GLOVE) ×2
GLOVE SURG ORTHO 9.0 STRL STRW (GLOVE) ×3 IMPLANT
GLOVE SURG SS PI 6.5 STRL IVOR (GLOVE) ×6 IMPLANT
GOWN STRL REUS W/ TWL LRG LVL3 (GOWN DISPOSABLE) ×1 IMPLANT
GOWN STRL REUS W/ TWL XL LVL3 (GOWN DISPOSABLE) ×2 IMPLANT
GOWN STRL REUS W/TWL LRG LVL3 (GOWN DISPOSABLE) ×2
GOWN STRL REUS W/TWL XL LVL3 (GOWN DISPOSABLE) ×4
KIT BASIN OR (CUSTOM PROCEDURE TRAY) ×3 IMPLANT
KIT PREVENA INCISION MGT 13 (CANNISTER) ×3 IMPLANT
KIT ROOM TURNOVER OR (KITS) ×3 IMPLANT
NS IRRIG 1000ML POUR BTL (IV SOLUTION) ×3 IMPLANT
PACK ORTHO EXTREMITY (CUSTOM PROCEDURE TRAY) ×3 IMPLANT
PAD ARMBOARD 7.5X6 YLW CONV (MISCELLANEOUS) ×3 IMPLANT
SPONGE LAP 18X18 X RAY DECT (DISPOSABLE) ×3 IMPLANT
SUT ETHILON 2 0 PSLX (SUTURE) ×6 IMPLANT
SUT VIC AB 2-0 CTB1 (SUTURE) IMPLANT
TOWEL OR 17X24 6PK STRL BLUE (TOWEL DISPOSABLE) IMPLANT
TOWEL OR 17X26 10 PK STRL BLUE (TOWEL DISPOSABLE) ×3 IMPLANT
WATER STERILE IRR 1000ML POUR (IV SOLUTION) IMPLANT

## 2015-09-06 NOTE — Anesthesia Postprocedure Evaluation (Signed)
Anesthesia Post Note  Patient: Rebekah Johnson  Procedure(s) Performed: Procedure(s) (LRB): LEFT TRANSMETATARSAL AMPUTATTION (Left)  Patient location during evaluation: PACU Anesthesia Type: General Level of consciousness: awake and alert Pain management: pain level controlled Vital Signs Assessment: post-procedure vital signs reviewed and stable Respiratory status: spontaneous breathing, nonlabored ventilation, respiratory function stable and patient connected to nasal cannula oxygen Cardiovascular status: blood pressure returned to baseline and stable Postop Assessment: no signs of nausea or vomiting Anesthetic complications: no    Last Vitals:  Vitals:   09/06/15 1300 09/06/15 1455  BP: (!) 208/86 (!) 210/102  Pulse: 67   Resp: (!) 21   Temp:  36.3 C    Last Pain:  Vitals:   09/06/15 1255  TempSrc: Oral  PainSc:                  Taylan Mayhan DAVID

## 2015-09-06 NOTE — Progress Notes (Signed)
Dr. Conrad [place] notified about blood pressure

## 2015-09-06 NOTE — Anesthesia Preprocedure Evaluation (Addendum)
Anesthesia Evaluation  Patient identified by MRN, date of birth, ID band Patient awake    Airway Mallampati: II  TM Distance: >3 FB Neck ROM: Full    Dental  (+) Teeth Intact, Dental Advisory Given   Pulmonary Current Smoker,    breath sounds clear to auscultation + decreased breath sounds      Cardiovascular hypertension, Pt. on medications and Pt. on home beta blockers + Peripheral Vascular Disease  Normal cardiovascular exam Rhythm:Regular Rate:Normal     Neuro/Psych    GI/Hepatic   Endo/Other  diabetes  Renal/GU Renal disease     Musculoskeletal   Abdominal (+) + obese,   Peds  Hematology   Anesthesia Other Findings   Reproductive/Obstetrics                            Anesthesia Physical Anesthesia Plan  ASA: III  Anesthesia Plan: General   Post-op Pain Management:    Induction: Intravenous  Airway Management Planned: LMA  Additional Equipment:   Intra-op Plan:   Post-operative Plan: Extubation in OR  Informed Consent: I have reviewed the patients History and Physical, chart, labs and discussed the procedure including the risks, benefits and alternatives for the proposed anesthesia with the patient or authorized representative who has indicated his/her understanding and acceptance.   Dental advisory given  Plan Discussed with: CRNA and Surgeon  Anesthesia Plan Comments:        Anesthesia Quick Evaluation

## 2015-09-06 NOTE — Progress Notes (Signed)
Orthopedic Tech Progress Note Patient Details:  Rebekah Johnson 12-May-1960 [id number]  Patient ID: Rebekah Johnson, female   DOB: 06-Jun-1960, 55 y.o.   MRN: [id number]   Rebekah Johnson 09/06/2015, 5:55 PM

## 2015-09-06 NOTE — Op Note (Signed)
     Date of Surgery: 09/06/2015  INDICATIONS: Rebekah Johnson is a 55 y.o.-year-old female who has what gangrene of the left forefoot. She has undergone revascularization intervention and presents at this time for transmetatarsal amputation.[name]  PREOPERATIVE DIAGNOSIS: Gangrene left forefoot  POSTOPERATIVE DIAGNOSIS: Same.  PROCEDURE: Transmetatarsal amputation Application of Prevena wound VAC  SURGEON: Sharol Given, M.D.  ANESTHESIA:  general  IV FLUIDS AND URINE: See anesthesia.  ESTIMATED BLOOD LOSS: min mL.  COMPLICATIONS: None.  DESCRIPTION OF PROCEDURE: The patient was brought to the operating room and underwent a general anesthetic. After adequate levels of anesthesia were obtained patient's lower extremity was prepped using DuraPrep draped into a sterile field. A timeout was called.  A fishmouth incision was made just proximal to the ulcerative nonviable tissue. This was carried sharply down to bone. A oscillating saw was used to perform a transmetatarsal amputation with a gentle cascade of the metatarsals and beveled plantarly. Electrocautery was used for hemostasis. The wound was irrigated with normal saline. The incision was closed using 2-0 nylon. A Prevena wound VAC was applied. This had a good suction fit. Patient was taken to the PACU in stable condition.  [name], MD [place] 2:35 PM

## 2015-09-06 NOTE — Transfer of Care (Signed)
Immediate Anesthesia Transfer of Care Note  Patient: Rebekah Johnson  Procedure(s) Performed: Procedure(s): LEFT TRANSMETATARSAL AMPUTATTION (Left)  Patient Location: PACU  Anesthesia Type:General  Level of Consciousness: awake and patient cooperative  Airway & Oxygen Therapy: Patient Spontanous Breathing and Patient connected to face mask oxygen  Post-op Assessment: Report given to RN and Post -op Vital signs reviewed and stable  Post vital signs: Reviewed and stable  Last Vitals:  Vitals:   09/06/15 1300 09/06/15 1455  BP: (!) 208/86 (!) 210/102  Pulse: 67   Resp: (!) 21   Temp:  36.3 C    Last Pain:  Vitals:   09/06/15 1255  TempSrc: Oral  PainSc:       Patients Stated Pain Goal: 5 ([phone] [id number])  Complications: No apparent anesthesia complications

## 2015-09-06 NOTE — H&P (Signed)
Rebekah Johnson is an 55 y.o. female.   Chief Complaint: White gangrene left forefoot HPI: Patient is a 56 year old woman diabetic insensate neuropathy peripheral vascular disease status post revascularization to the left lower extremity who has persistent wet gangrenous changes to the forefoot.  Past Medical History:  Diagnosis Date  . Diabetes mellitus without complication ([place])   . High cholesterol   . Hypertension     Past Surgical History:  Procedure Laterality Date  . ABDOMINAL HYSTERECTOMY    . PERIPHERAL VASCULAR CATHETERIZATION N/A 08/16/2015   Procedure: Abdominal Aortogram;  Surgeon: Elam Dutch, MD;  Location: [facility];  Service: Cardiovascular;  Laterality: N/A;  . PERIPHERAL VASCULAR CATHETERIZATION Bilateral 08/16/2015   Procedure: Lower Extremity Angiography;  Surgeon: Elam Dutch, MD;  Location: [facility];  Service: Cardiovascular;  Laterality: Bilateral;  . PERIPHERAL VASCULAR CATHETERIZATION Left 08/16/2015   Procedure: Peripheral Vascular Intervention;  Surgeon: Elam Dutch, MD;  Location: [facility];  Service: Cardiovascular;  Laterality: Left;  SFA STENT X 2    Family History  Problem Relation Age of Onset  . Diabetes Mother   . Hypertension Father    Social History:  reports that she has been smoking Cigarettes.  She has been smoking about 1.00 pack per day. She has never used smokeless tobacco. She reports that she drinks about 0.6 - 1.2 oz of alcohol per week . She reports that she does not use drugs.  Allergies: No Known Allergies  No prescriptions prior to admission.    No results found for this or any previous visit (from the past 48 hour(s)). No results found.  Review of Systems  All other systems reviewed and are negative.   There were no vitals taken for this visit. Physical Exam  On examination patient has a palpable dorsalis pedis pulse she has black gangrenous changes of the great toe second toe and  third toe. Assessment/Plan Assessment: Wet gangrene left forefoot status post revascularization with diabetic insensate neuropathy.  Plan: We will plan for left transmetatarsal amputation risk and benefits were discussed including risk of the wound not healing. Patient states she understands wishes to proceed at this time.  Newt Minion, MD 09/06/2015, 6:40 AM

## 2015-09-06 NOTE — Progress Notes (Signed)
Plan for discharge to home on Saturday. Patient does not need any prescriptions. Follow-up in the office in 1 week.

## 2015-09-07 LAB — GLUCOSE, CAPILLARY
GLUCOSE-CAPILLARY: 91 mg/dL (ref 65–99)
Glucose-Capillary: 173 mg/dL — ABNORMAL HIGH (ref 65–99)

## 2015-09-07 NOTE — Care Management Note (Signed)
Case Management Note  Patient Details  Name: Ercilia Resch MRN: [id number] Date of Birth: 06-16-1960  Subjective/Objective:    56 y.o. M s/p L Transmetatarsal Amputation W/C ordered from [facility]                Action/Plan: Anticipate discharge home today. No further CM needs but will be available should additional discharge needs arise.   Expected Discharge Date:                  Expected Discharge Plan:  Home/Self Care  In-House Referral:  NA  Discharge planning Services  CM Consult  Post Acute Care Choice:  Durable Medical Equipment Choice offered to:  Patient  DME Arranged:  Wheelchair manual DME Agency:  [place]:  NA [place] Agency:  NA  Status of Service:  Completed, signed off  If discussed at [place] of Stay Meetings, dates discussed:    Additional Comments:  Delrae Sawyers, RN 09/07/2015, 12:11 PM

## 2015-09-07 NOTE — Progress Notes (Signed)
Subjective: 1 Day Post-Op Procedure(s) (LRB): LEFT TRANSMETATARSAL AMPUTATTION (Left) Patient reports pain as moderate.  No complaints.  Objective: Vital signs in last 24 hours: Temp:  [97.3 F (36.3 C)-99.7 F (37.6 C)] 99.3 F (37.4 C) (09/02 0435) Pulse Rate:  [55-80] 72 (09/02 0435) Resp:  [15-22] 16 (09/02 0435) BP: (143-217)/(59-102) 143/59 (09/02 0435) SpO2:  [94 %-100 %] 98 % (09/02 0435) Weight:  [82.6 kg (182 lb)] 82.6 kg (182 lb) (09/01 1148)  Intake/Output from previous day: 09/01 0701 - 09/02 0700 In: 700 [I.V.:700] Out: 350 [Blood:350] Intake/Output this shift: No intake/output data recorded.   Recent Labs  09/06/15 1143  HGB 11.3*    Recent Labs  09/06/15 1143  WBC 10.1  RBC 4.31  HCT 36.0  PLT 401*    Recent Labs  09/06/15 1143  NA 140  K 3.7  CL 104  CO2 28  BUN 8  CREATININE 1.06*  GLUCOSE 105*  CALCIUM 9.7   No results for input(s): LABPT, INR in the last 72 hours.  Compartment soft  Wound Vac in place left foot  Assessment/Plan: 1 Day Post-Op Procedure(s) (LRB): LEFT TRANSMETATARSAL AMPUTATTION (Left) Discharge home with home health  Plan to follow up with Dr. Sharol Given in office on Tuesday Non weight bearing left lower extremity   Gid Schoffstall 09/07/2015, 8:44 AM

## 2015-09-07 NOTE — Progress Notes (Signed)
Discharge instructions provided to patient.  IV removed.  Wheelchair delivered to the room.  Wound vac changed overt to Prevena 45 ml wound vac.  Patient and family member educated on wound vac.  DME rep educated patient on how to use the wheelchair.  No complaints at this time.

## 2015-09-07 NOTE — Evaluation (Addendum)
Physical Therapy Evaluation Patient Details Name: Rebekah Johnson MRN: [id number] DOB: Oct 21, 1960 Today's Date: 09/07/2015   History of Present Illness  Pt is a 55 y/o female s/p L transmetatarsal amputation. PMH including but not limited to DM and HTN  Clinical Impression  Pt presented supine in bed with HOB elevated, awake and willing to participate in therapy session. Pt's cousin was present throughout session and will be available 24 hours/day until 09/10/15 when she has to return to work. Pt participate in gait training and required min guard for safety and management of equipment. Pt refused to participate in stair training with RW or any further therapeutic interventions. PT then discussed use of a w/c to ascend and descend stairs. Pt and pt's cousin both agreeable to use w/c to navigate stairs. Pt would continue to benefit from skilled physical therapy services at this time while admitted to address her below listed limitations in order to improve her overall safety and independence with functional mobility.      Follow Up Recommendations Supervision for mobility/OOB    Equipment Recommendations  Wheelchair (measurements PT);Wheelchair cushion (measurements PT);Other (comment) (w/c with elevating leg rests)    Recommendations for Other Services       Precautions / Restrictions Precautions Precautions: Fall Restrictions Weight Bearing Restrictions: Yes LLE Weight Bearing: Non weight bearing      Mobility  Bed Mobility Overal bed mobility: Needs Assistance Bed Mobility: Supine to Sit     Supine to sit: Supervision;HOB elevated     General bed mobility comments: pt required increased time and use of bed rails  Transfers Overall transfer level: Needs assistance Equipment used: Rolling walker (2 wheeled) Transfers: Sit to/from Stand Sit to Stand: Min guard         General transfer comment: pt required increased time and VC'ing for bilateral hand  positioning  Ambulation/Gait Ambulation/Gait assistance: Min guard;+2 safety/equipment Ambulation Distance (Feet): 40 Feet Assistive device: Rolling walker (2 wheeled) Gait Pattern/deviations: Step-to pattern (hop-to to maintain NWB L LE status) Gait velocity: decreased Gait velocity interpretation: Below normal speed for age/gender General Gait Details: Pt able to maintain NWB L LE throughout  Stairs            Wheelchair Mobility    Modified Rankin (Stroke Patients Only)       Balance Overall balance assessment: Needs assistance Sitting-balance support: Feet supported;No upper extremity supported Sitting balance-Leahy Scale: Fair     Standing balance support: During functional activity;Bilateral upper extremity supported Standing balance-Leahy Scale: Poor                               Pertinent Vitals/Pain Pain Assessment: Faces Faces Pain Scale: Hurts little more Pain Location: L foot Pain Descriptors / Indicators: Guarding;Grimacing Pain Intervention(s): Monitored during session;Repositioned;Patient requesting pain meds-RN notified    Home Living Family/patient expects to be discharged to:: Private residence Living Arrangements: Other relatives;Non-relatives/Friends Available Help at Discharge: Family;Available 24 hours/day;Other (Comment) (assistance available 24 hours/day until 09/10/15) Type of Home: House Home Access: Stairs to enter Entrance Stairs-Rails: None Entrance Stairs-Number of Steps: 3 Home Layout: One level Home Equipment: Walker - 2 wheels      Prior Function Level of Independence: Independent               Hand Dominance        Extremity/Trunk Assessment   Upper Extremity Assessment: Overall WFL for tasks assessed  Lower Extremity Assessment: LLE deficits/detail   LLE Deficits / Details: pt with decreased strength and ROM limitations secondary to post-op. However, pt able to flex at hip and move L  LE against gravity.     Communication   Communication: No difficulties  Cognition Arousal/Alertness: Awake/alert Behavior During Therapy: WFL for tasks assessed/performed Overall Cognitive Status: Within Functional Limits for tasks assessed                      General Comments General comments (skin integrity, edema, etc.): wound VAC in place    Exercises        Assessment/Plan    PT Assessment Patient needs continued PT services  PT Diagnosis Difficulty walking;Acute pain   PT Problem List Decreased strength;Decreased range of motion;Decreased activity tolerance;Decreased balance;Decreased mobility;Decreased coordination;Decreased knowledge of use of DME;Pain  PT Treatment Interventions DME instruction;Gait training;Stair training;Functional mobility training;Therapeutic activities;Therapeutic exercise;Balance training;Neuromuscular re-education;Patient/family education   PT Goals (Current goals can be found in the Care Plan section) Acute Rehab PT Goals Patient Stated Goal: return home PT Goal Formulation: With patient Time For Goal Achievement: 09/14/15 Potential to Achieve Goals: Good    Frequency Min 5X/week   Barriers to discharge        Co-evaluation               End of Session Equipment Utilized During Treatment: Gait belt Activity Tolerance: Patient limited by pain Patient left: in chair;with call bell/phone within reach;with family/visitor present Nurse Communication: Mobility status;Patient requests pain meds    Functional Assessment Tool Used: clinical judgement Functional Limitation: Mobility: Walking and moving around Mobility: Walking and Moving Around Current Status [phone]): At least 1 percent but less than 20 percent impaired, limited or restricted Mobility: Walking and Moving Around Goal Status [phone]): 0 percent impaired, limited or restricted    Time: 1015-1049 PT Time Calculation (min) (ACUTE ONLY): 34 min   Charges:   PT  Evaluation $PT Eval Moderate Complexity: 1 Procedure PT Treatments $Gait Training: 8-22 mins   PT G Codes:   PT G-Codes **NOT FOR INPATIENT CLASS** Functional Assessment Tool Used: clinical judgement Functional Limitation: Mobility: Walking and moving around Mobility: Walking and Moving Around Current Status [id number]): At least 1 percent but less than 20 percent impaired, limited or restricted Mobility: Walking and Moving Around Goal Status [phone]): 0 percent impaired, limited or restricted    [facility] 09/07/2015, 11:36 AM Sherie Don, PT, DPT [phone]

## 2015-09-07 NOTE — Discharge Summary (Signed)
Patient ID: Rebekah Johnson MRN: [id number] DOB/AGE: November 24, 1960 55 y.o.  Admit date: 09/06/2015 Discharge date: 09/07/2015  Admission Diagnoses:  Active Problems:   S/P transmetatarsal amputation of foot Rebekah Johnson)   Discharge Diagnoses:  Same  Past Medical History:  Diagnosis Date  . Diabetes mellitus without complication ([place])   . High cholesterol   . Hypertension     Surgeries: Procedure(s): LEFT TRANSMETATARSAL AMPUTATTION on 09/06/2015   Consultants: None  Discharged Condition: Improved  Hospital Course: Rebekah Johnson is an 55 y.o. female who was admitted 09/06/2015 for operative treatment of<principal problem not specified>. Patient has severe unremitting pain that affects sleep, daily activities, and work/hobbies. After pre-op clearance the patient was taken to the operating room on 09/06/2015 and underwent  Procedure(s): LEFT TRANSMETATARSAL AMPUTATTION.    Patient was given perioperative antibiotics: Anti-infectives    Start     Dose/Rate Route Frequency Ordered Stop   09/06/15 2000  ceFAZolin (ANCEF) IVPB 2g/100 mL premix     2 g 200 mL/hr over 30 Minutes Intravenous Every 6 hours 09/06/15 1615 09/07/15 1359   09/06/15 1130  ceFAZolin (ANCEF) IVPB 2g/100 mL premix     2 g 200 mL/hr over 30 Minutes Intravenous On call to O.R. 09/06/15 1117 09/06/15 1358   09/06/15 1119  ceFAZolin (ANCEF) 2-4 GM/100ML-% IVPB    Comments:  Fabian Sharp   : cabinet override      09/06/15 1119 09/06/15 2329       Patient was given sequential compression devices, early ambulation, and chemoprophylaxis to prevent DVT.  Patient benefited maximally from hospital stay and there were no complications.    Recent vital signs: Patient Vitals for the past 24 hrs:  BP Temp Temp src Pulse Resp SpO2 Height Weight  09/07/15 0435 (!) 143/59 99.3 F (37.4 C) Oral 72 16 98 % - -  09/07/15 0126 (!) 153/64 - - - - - - -  09/07/15 0124 (!) 162/71 99.7 F (37.6 C) Oral 75 15 97 % - -  09/06/15 2116  (!) 160/68 - - - - - - -  09/06/15 2114 (!) 160/65 - - 60 - - - -  09/06/15 2037 (!) 165/64 - - - - - - -  09/06/15 2017 (!) 161/66 97.3 F (36.3 C) Oral 80 16 94 % - -  09/06/15 1754 (!) 157/68 - - - - - - -  09/06/15 1752 (!) 157/68 - - - - - - -  09/06/15 1721 (!) 157/68 - - 65 - - - -  09/06/15 1612 (!) 157/68 97.5 F (36.4 C) Oral (!) 55 18 97 % - -  09/06/15 1540 (!) 165/72 97.7 F (36.5 C) - - - - - -  09/06/15 1530 (!) 167/74 - - - - - - -  09/06/15 1515 (!) 169/80 - - - - - - -  09/06/15 1508 (!) 188/80 - - - - - - -  09/06/15 1455 (!) 210/102 97.4 F (36.3 C) - - 16 100 % - -  09/06/15 1300 (!) 208/86 - - 67 (!) 21 100 % - -  09/06/15 1255 (!) 201/87 97.8 F (36.6 C) Oral 65 18 100 % - -  09/06/15 1230 - - - 76 16 100 % - -  09/06/15 1225 (!) 209/74 - - 80 16 99 % - -  09/06/15 1220 - - - 75 18 100 % - -  09/06/15 1215 (!) 204/85 - - 76 (!) 22 100 % - -  09/06/15  1210 - - - 71 (!) 22 100 % - -  09/06/15 1205 (!) 211/62 - - 64 16 100 % - -  09/06/15 1200 (!) 212/67 - - 67 15 100 % - -  09/06/15 1155 - - - (!) 58 17 100 % - -  09/06/15 1150 (!) 217/87 - - 66 - 100 % - -  09/06/15 1148 - - - - - - '5\' 4"'  (1.626 m) 82.6 kg (182 lb)     Recent laboratory studies:  Recent Labs  09/06/15 1143  WBC 10.1  HGB 11.3*  HCT 36.0  PLT 401*  NA 140  K 3.7  CL 104  CO2 28  BUN 8  CREATININE 1.06*  GLUCOSE 105*  CALCIUM 9.7     Discharge Medications:     Medication List    TAKE these medications   amLODipine 10 MG tablet Commonly known as:  NORVASC Take 1 tablet (10 mg total) by mouth daily. Reported on 07/13/2015   aspirin 81 MG EC tablet Take 1 tablet (81 mg total) by mouth daily. Reported on 07/13/2015   atorvastatin 40 MG tablet Commonly known as:  LIPITOR Take 1 tablet (40 mg total) by mouth daily. Reported on 07/13/2015   blood glucose meter kit and supplies Kit Dispense based on patient and insurance preference. Use up to four times daily as directed. (FOR  ICD-9 250.00, 250.01).   carvedilol 3.125 MG tablet Commonly known as:  COREG Take 1 tablet (3.125 mg total) by mouth 2 (two) times daily with a meal. Reported on 07/03/2015   clopidogrel 75 MG tablet Commonly known as:  PLAVIX Take 1 tablet (75 mg total) by mouth daily.   Estradiol-Norethindrone Acet 0.5-0.1 MG tablet Take 1 tablet by mouth daily.   Fish Oil 1000 MG Caps Take 1 capsule by mouth daily. Reported on 07/13/2015   gabapentin 300 MG capsule Commonly known as:  NEURONTIN Take 1 capsule (300 mg total) by mouth 3 (three) times daily.   glipiZIDE 10 MG tablet Commonly known as:  GLUCOTROL Take 1 tablet (10 mg total) by mouth 2 (two) times daily before a meal.   isosorbide mononitrate 60 MG 24 hr tablet Commonly known as:  IMDUR Take 1 tablet (60 mg total) by mouth daily.   lisinopril 10 MG tablet Commonly known as:  PRINIVIL,ZESTRIL Take 1 tablet (10 mg total) by mouth daily.   LOPID 600 MG tablet Generic drug:  gemfibrozil Take 1 tablet by mouth 2 (two) times daily. Reported on 07/13/2015   oxyCODONE-acetaminophen 5-325 MG tablet Commonly known as:  PERCOCET/ROXICET Take 1-2 tablets by mouth every 6 (six) hours as needed.   sitaGLIPtin 50 MG tablet Commonly known as:  JANUVIA Take 1 tablet (50 mg total) by mouth daily.   Vitamin D-3 1000 units Caps Take 1 capsule by mouth daily.       Diagnostic Studies: No results found.  Disposition: 01-Home or Self Care  Discharge Instructions    Call MD / Call 911    Complete by:  As directed   If you experience chest pain or shortness of breath, CALL 911 and be transported to the hospital emergency room.  If you develope a fever above 101 F, pus (white drainage) or increased drainage or redness at the wound, or calf pain, call your surgeon's office.   Constipation Prevention    Complete by:  As directed   Drink plenty of fluids.  Prune juice may be helpful.  You may use  a stool softener, such as Colace (over the  counter) 100 mg twice a day.  Use MiraLax (over the counter) for constipation as needed.   Diet - low sodium heart healthy    Complete by:  As directed   Increase activity slowly as tolerated    Complete by:  As directed   Neg Press Wound Therapy / Incisional    Complete by:  As directed   When the wound VAC alarms remove dressing and wound VAC and apply dry dressing      Follow-up Information    Newt Minion, MD In 1 week.   Specialty:  Orthopedic Surgery Contact information: [place] [address] [phone]            Signed: Erskine Emery 09/07/2015, 8:44 AM

## 2015-09-07 NOTE — Discharge Instructions (Signed)
Non weight bearing left lower extremity

## 2015-09-08 ENCOUNTER — Encounter (HOSPITAL_COMMUNITY): Payer: Self-pay | Admitting: Orthopedic Surgery

## 2015-09-10 ENCOUNTER — Ambulatory Visit (HOSPITAL_COMMUNITY)
Admission: RE | Admit: 2015-09-10 | Discharge: 2015-09-10 | Disposition: A | Discharge status: Home / Self Care | Payer: Self-pay | Source: Ambulatory Visit | Attending: Orthopedic Surgery | Admitting: Orthopedic Surgery

## 2015-09-10 ENCOUNTER — Encounter (HOSPITAL_COMMUNITY): Payer: Self-pay | Admitting: *Deleted

## 2015-09-10 ENCOUNTER — Emergency Department (HOSPITAL_COMMUNITY)
Admission: EM | Admit: 2015-09-10 | Discharge: 2015-09-10 | Disposition: A | Discharge status: Home / Self Care | Payer: Self-pay | Attending: Dermatology | Admitting: Dermatology

## 2015-09-10 ENCOUNTER — Other Ambulatory Visit (HOSPITAL_COMMUNITY): Payer: Self-pay | Admitting: Orthopedic Surgery

## 2015-09-10 DIAGNOSIS — M79605 Pain in left leg: Secondary | ICD-10-CM | POA: Insufficient documentation

## 2015-09-10 DIAGNOSIS — Z5321 Procedure and treatment not carried out due to patient leaving prior to being seen by health care provider: Secondary | ICD-10-CM | POA: Insufficient documentation

## 2015-09-10 DIAGNOSIS — Z7982 Long term (current) use of aspirin: Secondary | ICD-10-CM | POA: Insufficient documentation

## 2015-09-10 DIAGNOSIS — Z7984 Long term (current) use of oral hypoglycemic drugs: Secondary | ICD-10-CM | POA: Insufficient documentation

## 2015-09-10 DIAGNOSIS — M7989 Other specified soft tissue disorders: Principal | ICD-10-CM

## 2015-09-10 DIAGNOSIS — I10 Essential (primary) hypertension: Secondary | ICD-10-CM | POA: Insufficient documentation

## 2015-09-10 DIAGNOSIS — M79602 Pain in left arm: Secondary | ICD-10-CM

## 2015-09-10 DIAGNOSIS — I82492 Acute embolism and thrombosis of other specified deep vein of left lower extremity: Secondary | ICD-10-CM | POA: Insufficient documentation

## 2015-09-10 DIAGNOSIS — E119 Type 2 diabetes mellitus without complications: Secondary | ICD-10-CM | POA: Insufficient documentation

## 2015-09-10 DIAGNOSIS — F17210 Nicotine dependence, cigarettes, uncomplicated: Secondary | ICD-10-CM | POA: Insufficient documentation

## 2015-09-10 MED FILL — ISOSORBIDE MN ER 60 MG TAB: 60 | 30 days supply | Qty: 30 | Fill #1

## 2015-09-10 MED FILL — LISINOPRIL 10 MG TABLET: 10 | 30 days supply | Qty: 30 | Fill #1

## 2015-09-10 MED FILL — AMLODIPINE BESYLATE 10 MG T: 10 | 30 days supply | Qty: 30 | Fill #1

## 2015-09-10 NOTE — ED Notes (Signed)
Erin with [facility] called, pt does not need to be seen in ED.  Prescription is being called to pharmacy.  If any questions call the office.

## 2015-09-10 NOTE — ED Triage Notes (Signed)
Pt had toes amputated 9/2. Pt had venous US done at Dr. Jess Barters office. Pt states small blood clot in left leg.

## 2015-09-10 NOTE — Progress Notes (Signed)
**  Preliminary report by tech**  Left lower extremity venous duplex completed. There is evidence of deep vein thrombosis involving a small segment of the left gastrocnemius vein. Unable to visualize left peroneal veins due to swelling, patient pain tolerance, and acoustic shadowing.  No evidence of superficial thrombosis involving the left lower extremity.  There is no evidence of a Baker's cyst on the left.   Attempted to call the ordering physician at [phone] and [phone] with no answer. The patient was sent to the ED for further evaluation. 3:55p Received a call from Dr. Sharol Given who was given the above results. 4:12p  09/10/15 4:15 PM Rebekah Johnson RVT

## 2015-09-11 MED FILL — **XARELTO 15 MG TABLET: 15 MG | 21 days supply | Qty: 42 | Fill #0

## 2015-09-18 ENCOUNTER — Telehealth: Payer: Self-pay

## 2015-09-18 MED FILL — DOXYCYCLINE 100 MG TABLET: 100 | 30 days supply | Qty: 60 | Fill #0

## 2015-09-18 MED FILL — GABAPENTIN 600 MG TABLET: 600 | 30 days supply | Qty: 90 | Fill #0

## 2015-09-18 NOTE — Telephone Encounter (Signed)
-----   Message from Arnoldo Morale, MD sent at 09/11/2015  1:34 PM EDT ----- Lower extremity ordered by her orthopedic reveals left DVT. Please check with her to see that she has been placed on anticoagulation.

## 2015-09-18 NOTE — Telephone Encounter (Signed)
Writer called patient per Dr. Jarold Song regarding recently found DVT.  Writer LVM asking patient to call back in regards to this test result. It appears on patient's med list that she started plavix on 08/16/15.

## 2015-10-14 ENCOUNTER — Ambulatory Visit: Payer: Self-pay | Admitting: Family Medicine

## 2015-10-14 ENCOUNTER — Ambulatory Visit (INDEPENDENT_AMBULATORY_CARE_PROVIDER_SITE_OTHER): Payer: Self-pay | Admitting: Orthopedic Surgery

## 2015-10-14 DIAGNOSIS — Z89432 Acquired absence of left foot: Secondary | ICD-10-CM

## 2015-10-14 DIAGNOSIS — E1142 Type 2 diabetes mellitus with diabetic polyneuropathy: Secondary | ICD-10-CM

## 2015-10-14 DIAGNOSIS — I70245 Atherosclerosis of native arteries of left leg with ulceration of other part of foot: Secondary | ICD-10-CM

## 2015-10-23 ENCOUNTER — Ambulatory Visit: Payer: Self-pay | Attending: Family Medicine | Admitting: Family Medicine

## 2015-10-23 ENCOUNTER — Encounter: Payer: Self-pay | Admitting: Family Medicine

## 2015-10-23 VITALS — BP 193/70 | HR 55 | Temp 97.7°F | Ht 64.0 in | Wt 168.8 lb

## 2015-10-23 DIAGNOSIS — Z7982 Long term (current) use of aspirin: Secondary | ICD-10-CM | POA: Insufficient documentation

## 2015-10-23 DIAGNOSIS — E1140 Type 2 diabetes mellitus with diabetic neuropathy, unspecified: Secondary | ICD-10-CM | POA: Insufficient documentation

## 2015-10-23 DIAGNOSIS — F17200 Nicotine dependence, unspecified, uncomplicated: Secondary | ICD-10-CM | POA: Insufficient documentation

## 2015-10-23 DIAGNOSIS — Z89422 Acquired absence of other left toe(s): Secondary | ICD-10-CM | POA: Insufficient documentation

## 2015-10-23 DIAGNOSIS — E7800 Pure hypercholesterolemia, unspecified: Secondary | ICD-10-CM | POA: Insufficient documentation

## 2015-10-23 DIAGNOSIS — Z9889 Other specified postprocedural states: Secondary | ICD-10-CM | POA: Insufficient documentation

## 2015-10-23 DIAGNOSIS — I158 Other secondary hypertension: Secondary | ICD-10-CM | POA: Insufficient documentation

## 2015-10-23 DIAGNOSIS — I10 Essential (primary) hypertension: Secondary | ICD-10-CM

## 2015-10-23 DIAGNOSIS — Z9114 Patient's other noncompliance with medication regimen: Secondary | ICD-10-CM | POA: Insufficient documentation

## 2015-10-23 DIAGNOSIS — Z89432 Acquired absence of left foot: Secondary | ICD-10-CM

## 2015-10-23 DIAGNOSIS — Z7902 Long term (current) use of antithrombotics/antiplatelets: Secondary | ICD-10-CM | POA: Insufficient documentation

## 2015-10-23 DIAGNOSIS — I739 Peripheral vascular disease, unspecified: Secondary | ICD-10-CM | POA: Insufficient documentation

## 2015-10-23 DIAGNOSIS — Z955 Presence of coronary angioplasty implant and graft: Secondary | ICD-10-CM | POA: Insufficient documentation

## 2015-10-23 DIAGNOSIS — Z90710 Acquired absence of both cervix and uterus: Secondary | ICD-10-CM | POA: Insufficient documentation

## 2015-10-23 DIAGNOSIS — E1151 Type 2 diabetes mellitus with diabetic peripheral angiopathy without gangrene: Secondary | ICD-10-CM

## 2015-10-23 DIAGNOSIS — Z720 Tobacco use: Secondary | ICD-10-CM

## 2015-10-23 DIAGNOSIS — Z23 Encounter for immunization: Secondary | ICD-10-CM | POA: Insufficient documentation

## 2015-10-23 LAB — GLUCOSE, POCT (MANUAL RESULT ENTRY): POC Glucose: 130 mg/dl — AB (ref 70–99)

## 2015-10-23 LAB — POCT GLYCOSYLATED HEMOGLOBIN (HGB A1C): HEMOGLOBIN A1C: 6.1

## 2015-10-23 NOTE — Progress Notes (Signed)
Subjective:  Patient ID: Rebekah Johnson, female    DOB: 30-Jan-1960  Age: 54 y.o. MRN: [id number]  CC: Diabetes (left foot toe amputation)   HPI Rebekah Johnson is a 55 year old female with a history of type 2 diabetes mellitus (A1c 6.1 from today), diabetic neuropathy, peripheral vascular disease (status post left SFA and popliteal stent and left SFA PTA with drug coated balloon in 08/2015), tobacco abuse, hypertension who presents today after hospitalization status post left foot transmetatarsal amputation due to dry gangrene of the left foot.  Since discharge she has been to see her orthopedic and has been performing dressing changes at home and also receives help from her cousin. Complains of pain in her left foot but has been unable to pickup her pain medications from the pharmacy due to cost.  Her blood pressure is significantly elevated and she never picked up her refills from the pharmacy. Brings in a form for disability addressed to Dr. Sharol Given which she is requesting that I complete.  Past Medical History:  Diagnosis Date  . Diabetes mellitus without complication ([place])   . High cholesterol   . Hypertension     Past Surgical History:  Procedure Laterality Date  . ABDOMINAL HYSTERECTOMY    . AMPUTATION Left 09/06/2015   Procedure: LEFT TRANSMETATARSAL AMPUTATTION;  Surgeon: Newt Minion, MD;  Location: [place];  Service: Orthopedics;  Laterality: Left;  . PERIPHERAL VASCULAR CATHETERIZATION N/A 08/16/2015   Procedure: Abdominal Aortogram;  Surgeon: Elam Dutch, MD;  Location: [place] [facility];  Service: Cardiovascular;  Laterality: N/A;  . PERIPHERAL VASCULAR CATHETERIZATION Bilateral 08/16/2015   Procedure: Lower Extremity Angiography;  Surgeon: Elam Dutch, MD;  Location: [place] [facility];  Service: Cardiovascular;  Laterality: Bilateral;  . PERIPHERAL VASCULAR CATHETERIZATION Left 08/16/2015   Procedure: Peripheral Vascular Intervention;  Surgeon: Elam Dutch, MD;   Location: [place] [facility];  Service: Cardiovascular;  Laterality: Left;  SFA STENT X 2    No Known Allergies   Outpatient Medications Prior to Visit  Medication Sig Dispense Refill  . amLODipine (NORVASC) 10 MG tablet Take 1 tablet (10 mg total) by mouth daily. Reported on 07/13/2015 30 tablet 3  . aspirin 81 MG EC tablet Take 1 tablet (81 mg total) by mouth daily. Reported on 07/13/2015 30 tablet 3  . atorvastatin (LIPITOR) 40 MG tablet Take 1 tablet (40 mg total) by mouth daily. Reported on 07/13/2015 30 tablet 3  . blood glucose meter kit and supplies KIT Dispense based on patient and insurance preference. Use up to four times daily as directed. (FOR ICD-9 250.00, 250.01). 1 each 3  . carvedilol (COREG) 3.125 MG tablet Take 1 tablet (3.125 mg total) by mouth 2 (two) times daily with a meal. Reported on 07/03/2015 60 tablet 3  . Cholecalciferol (VITAMIN D-3) 1000 units CAPS Take 1 capsule by mouth daily.    . clopidogrel (PLAVIX) 75 MG tablet Take 1 tablet (75 mg total) by mouth daily. 30 tablet 11  . Estradiol-Norethindrone Acet 0.5-0.1 MG tablet Take 1 tablet by mouth daily. 30 tablet 1  . gabapentin (NEURONTIN) 300 MG capsule Take 1 capsule (300 mg total) by mouth 3 (three) times daily. 90 capsule 3  . gemfibrozil (LOPID) 600 MG tablet Take 1 tablet by mouth 2 (two) times daily. Reported on 07/13/2015    . glipiZIDE (GLUCOTROL) 10 MG tablet Take 1 tablet (10 mg total) by mouth 2 (two) times daily before a meal. 60 tablet 3  .  isosorbide mononitrate (IMDUR) 60 MG 24 hr tablet Take 1 tablet (60 mg total) by mouth daily. 30 tablet 3  . lisinopril (PRINIVIL,ZESTRIL) 10 MG tablet Take 1 tablet (10 mg total) by mouth daily. 30 tablet 3  . Omega-3 Fatty Acids (FISH OIL) 1000 MG CAPS Take 1 capsule by mouth daily. Reported on 07/13/2015    . oxyCODONE-acetaminophen (PERCOCET/ROXICET) 5-325 MG tablet Take 1-2 tablets by mouth every 6 (six) hours as needed. (Patient not taking: Reported on 10/23/2015) 25  tablet 0  . sitaGLIPtin (JANUVIA) 50 MG tablet Take 1 tablet (50 mg total) by mouth daily. (Patient not taking: Reported on 10/23/2015) 90 tablet 3   No facility-administered medications prior to visit.     ROS Review of Systems  Constitutional: Negative for activity change, appetite change and fatigue.  HENT: Negative for congestion, sinus pressure and sore throat.   Eyes: Negative for visual disturbance.  Respiratory: Negative for cough, chest tightness, shortness of breath and wheezing.   Cardiovascular: Negative for chest pain and palpitations.  Gastrointestinal: Negative for abdominal distention, abdominal pain and constipation.  Endocrine: Negative for polydipsia.  Genitourinary: Negative for dysuria and frequency.  Musculoskeletal: Negative for arthralgias and back pain.       See hpi  Skin: Negative for rash.  Neurological: Negative for tremors, light-headedness and numbness.  Hematological: Does not bruise/bleed easily.  Psychiatric/Behavioral: Negative for agitation and behavioral problems.    Objective:  BP (!) 193/70 (BP Location: Right Arm, Patient Position: Sitting, Cuff Size: Large)   Pulse (!) 55   Temp 97.7 F (36.5 C) (Oral)   Ht '5\' 4"'  (1.626 m)   Wt 168 lb 12.8 oz (76.6 kg)   SpO2 100%   BMI 28.97 kg/m   BP/Weight 10/23/2015 04/10/6597 03/10/7015  Systolic BP 793 903 009  Diastolic BP 70 61 66  Wt. (Lbs) 168.8 - -  BMI 28.97 - -      Physical Exam  Constitutional: She is oriented to person, place, and time. She appears well-developed and well-nourished.  Cardiovascular: Normal rate and normal heart sounds.   No murmur heard. Absent dorsalis pedis bilaterally  Pulmonary/Chest: Effort normal and breath sounds normal. She has no wheezes. She has no rales. She exhibits no tenderness.  Abdominal: Soft. Bowel sounds are normal. She exhibits no distension and no mass. There is no tenderness.  Musculoskeletal:  Left foot transmetatarsal amputation with  minimal serous discharge.  Neurological: She is alert and oriented to person, place, and time.     Assessment & Plan:   1. Type 2 diabetes mellitus with peripheral angiopathy (HCC) Controlled with A1c of 6.1 Continue medications - Glucose (CBG) - HgB A1c  2. Status post transmetatarsal amputation of left foot [facility]) Imaging change performed in the clinic Patient advised to pickup her prescription for pain medication from the pharmacy Keep follow-up appointment with Dr. Sharol Given She has a disability form addressed to Dr. Sharol Given  which she would like completed but I advised her to turn that in to Dr. Jess Barters office for completion as disability secondary to surgery.  3. Accelerated hypertension Due to noncompliance with antihypertensives I have advised her she does have refills in the pharmacy and only needs to go therapy. Low-sodium diet  4. Peripheral vascular disease ([place]) Status post left SFA and popliteal stent, left SFA PTA with drug-coated balloon  5. Current tobacco use Continues to smoke and have explained to her that she isn't high risk for worsening of peripheral vascular disease. She is  not ready to quit at this time  6. Needs flu shot - Flu Vaccine QUAD 36+ mos PF IM (Fluarix & Fluzone Quad PF)   No orders of the defined types were placed in this encounter.   Follow-up: Return in about 2 weeks (around 11/06/2015) for Follow-up on hypertension.   Arnoldo Morale MD

## 2015-10-23 NOTE — Patient Instructions (Signed)
Smoking Cessation, Tips for Success If you are ready to quit smoking, congratulations! You have chosen to help yourself be healthier. Cigarettes bring nicotine, tar, carbon monoxide, and other irritants into your body. Your lungs, heart, and blood vessels will be able to work better without these poisons. There are many different ways to quit smoking. Nicotine gum, nicotine patches, a nicotine inhaler, or nicotine nasal spray can help with physical craving. Hypnosis, support groups, and medicines help break the habit of smoking. WHAT THINGS CAN I DO TO MAKE QUITTING EASIER?  Here are some tips to help you quit for good:  Pick a date when you will quit smoking completely. Tell all of your friends and family about your plan to quit on that date.  Do not try to slowly cut down on the number of cigarettes you are smoking. Pick a quit date and quit smoking completely starting on that day.  Throw away all cigarettes.   Clean and remove all ashtrays from your home, work, and car.  On a card, write down your reasons for quitting. Carry the card with you and read it when you get the urge to smoke.  Cleanse your body of nicotine. Drink enough water and fluids to keep your urine clear or pale yellow. Do this after quitting to flush the nicotine from your body.  Learn to predict your moods. Do not let a bad situation be your excuse to have a cigarette. Some situations in your life might tempt you into wanting a cigarette.  Never have "just one" cigarette. It leads to wanting another and another. Remind yourself of your decision to quit.  Change habits associated with smoking. If you smoked while driving or when feeling stressed, try other activities to replace smoking. Stand up when drinking your coffee. Brush your teeth after eating. Sit in a different chair when you read the paper. Avoid alcohol while trying to quit, and try to drink fewer caffeinated beverages. Alcohol and caffeine may urge you to  smoke.  Avoid foods and drinks that can trigger a desire to smoke, such as sugary or spicy foods and alcohol.  Ask people who smoke not to smoke around you.  Have something planned to do right after eating or having a cup of coffee. For example, plan to take a walk or exercise.  Try a relaxation exercise to calm you down and decrease your stress. Remember, you may be tense and nervous for the first 2 weeks after you quit, but this will pass.  Find new activities to keep your hands busy. Play with a pen, coin, or rubber band. Doodle or draw things on paper.  Brush your teeth right after eating. This will help cut down on the craving for the taste of tobacco after meals. You can also try mouthwash.   Use oral substitutes in place of cigarettes. Try using lemon drops, carrots, cinnamon sticks, or chewing gum. Keep them handy so they are available when you have the urge to smoke.  When you have the urge to smoke, try deep breathing.  Designate your home as a nonsmoking area.  If you are a heavy smoker, ask your health care provider about a prescription for nicotine chewing gum. It can ease your withdrawal from nicotine.  Reward yourself. Set aside the cigarette money you save and buy yourself something nice.  Look for support from others. Join a support group or smoking cessation program. Ask someone at home or at work to help you with your plan   to quit smoking.  Always ask yourself, "Do I need this cigarette or is this just a reflex?" Tell yourself, "Today, I choose not to smoke," or "I do not want to smoke." You are reminding yourself of your decision to quit.  Do not replace cigarette smoking with electronic cigarettes (commonly called e-cigarettes). The safety of e-cigarettes is unknown, and some may contain harmful chemicals.  If you relapse, do not give up! Plan ahead and think about what you will do the next time you get the urge to smoke. HOW WILL I FEEL WHEN I QUIT SMOKING? You  may have symptoms of withdrawal because your body is used to nicotine (the addictive substance in cigarettes). You may crave cigarettes, be irritable, feel very hungry, cough often, get headaches, or have difficulty concentrating. The withdrawal symptoms are only temporary. They are strongest when you first quit but will go away within 10-14 days. When withdrawal symptoms occur, stay in control. Think about your reasons for quitting. Remind yourself that these are signs that your body is healing and getting used to being without cigarettes. Remember that withdrawal symptoms are easier to treat than the major diseases that smoking can cause.  Even after the withdrawal is over, expect periodic urges to smoke. However, these cravings are generally short lived and will go away whether you smoke or not. Do not smoke! WHAT RESOURCES ARE AVAILABLE TO HELP ME QUIT SMOKING? Your health care provider can direct you to community resources or hospitals for support, which may include:  Group support.  Education.  Hypnosis.  Therapy.   This information is not intended to replace advice given to you by your health care provider. Make sure you discuss any questions you have with your health care provider.   Document Released: 09/20/2003 Document Revised: 01/12/2014 Document Reviewed: 06/09/2012 Elsevier Interactive Patient Education 2016 [employer] Inc.  

## 2015-10-23 NOTE — Progress Notes (Signed)
meds refills Patient tolerated flu vaccine well.

## 2015-10-29 ENCOUNTER — Encounter (INDEPENDENT_AMBULATORY_CARE_PROVIDER_SITE_OTHER): Payer: Self-pay | Admitting: Orthopedic Surgery

## 2015-10-29 ENCOUNTER — Ambulatory Visit (INDEPENDENT_AMBULATORY_CARE_PROVIDER_SITE_OTHER): Payer: Self-pay | Admitting: Orthopedic Surgery

## 2015-10-29 VITALS — Ht 64.0 in | Wt 182.0 lb

## 2015-10-29 DIAGNOSIS — I70262 Atherosclerosis of native arteries of extremities with gangrene, left leg: Secondary | ICD-10-CM

## 2015-10-29 DIAGNOSIS — L97521 Non-pressure chronic ulcer of other part of left foot limited to breakdown of skin: Secondary | ICD-10-CM

## 2015-10-29 MED ORDER — NITROGLYCERIN 0.2 MG/HR TD PT24: 0.2000 mg | MEDICATED_PATCH | Freq: Every day | TRANSDERMAL | 3 refills | Status: DC

## 2015-10-29 MED ORDER — DOXYCYCLINE HYCLATE 100 MG PO TABS: 100.0000 mg | ORAL_TABLET | Freq: Two times a day (BID) | ORAL | 0 refills | Status: DC

## 2015-10-29 MED FILL — NITROGLYCERIN 0.2 MG/HR PAT: 0.2 | 30 days supply | Qty: 30 | Fill #0

## 2015-10-29 MED FILL — DOXYCYCLINE 100 MG TABLET: 100 | 30 days supply | Qty: 60 | Fill #0

## 2015-10-29 NOTE — Progress Notes (Signed)
Wound Care Note   Patient: Rebekah Johnson           Date of Birth: [birth year]           MRN: [id number]             PCP: Arnoldo Morale, MD Visit Date: 10/29/2015   Assessment & Plan: Visit Diagnoses:  1. Non-pressure chronic ulcer of other part of left foot limited to breakdown of skin ([place])   2. Atherosclerosis of native artery of left lower extremity with gangrene [facility])     Plan: Plan to follow-up in 2 weeks. Patient has to ischemic ulcers we will start on doxycycline and nitroglycerin patch.   Follow-Up Instructions: Return in about 2 weeks (around 11/12/2015).  Orders:  No orders of the defined types were placed in this encounter.  Meds ordered this encounter  Medications  . glucose blood test strip    Refill:  3  . DISCONTD: Glucose Blood (TRUE METRIX BLOOD GLUCOSE TEST VI)    Refill:  3  . DISCONTD: doxycycline (VIBRA-TABS) 100 MG tablet    Refill:  0  . TRUEPLUS LANCETS 28G MISC    Refill:  3  . DISCONTD: doxycycline (VIBRA-TABS) 100 MG tablet    Sig: Take 1 tablet (100 mg total) by mouth 2 (two) times daily.    Dispense:  60 tablet    Refill:  0  . DISCONTD: nitroGLYCERIN (NITRODUR - DOSED IN MG/24 HR) 0.2 mg/hr patch    Sig: Place 1 patch (0.2 mg total) onto the skin daily.    Dispense:  30 patch    Refill:  3    Apply patch near the effected area, change location daily  . nitroGLYCERIN (NITRODUR - DOSED IN MG/24 HR) 0.2 mg/hr patch    Sig: Place 1 patch (0.2 mg total) onto the skin daily.    Dispense:  30 patch    Refill:  3    Apply patch near the effected area, change location daily  . doxycycline (VIBRA-TABS) 100 MG tablet    Sig: Take 1 tablet (100 mg total) by mouth 2 (two) times daily.    Dispense:  60 tablet    Refill:  0      Procedures: No notes on file   Clinical Data: No additional findings.   No images are attached to the encounter.   Subjective: Chief Complaint  Patient presents with  . Left Foot - Routine Post Op  . Routine  Post Op    Left transmetarsal amputation with prevena application on 0/0/1749    Patient presents in office today for 2 week follow up evaluation of left transmetatarsal amputation on 09/23/15. She is currently 7 weeks 4 days post op. She is completed oral abx treatment of doxycycline 100mg  1 po BID. She is noncompliant with compression stocking to left lower extremity. She states there is no way to get a compression stocking over current dressing she is wearing. She is applying a wet to dry dressing daily, she states she was instructed to do this by Erin at last office visit. Serosanguinous drainage present from wound bed with odor. There is maceration and skin breakdown. There is Patient is having a significant amount of pain, she continues to smoke cigarettes. She is non-weightbearing in a post operative shoe.     Review of Systems  Miscellaneous:  -Home Health Care: dressing changes and wound check weekly  -Physical Therapy: no, patient nonwtb left lower extremity  -Out of Work?: not  working   Objective: Vital Signs: Ht 5\' 4"  (1.626 m)   Wt 182 lb (82.6 kg)   BMI 31.24 kg/m   Physical Exam: Plan to follow-up in 2 weeks. Patient has to ischemic ulcer she will continue wound care start doxycycline started nitroglycerin patch.  Specialty Comments: No specialty comments available.   PMFS History: Patient Active Problem List   Diagnosis Date Noted  . S/P transmetatarsal amputation of foot ([place]) 09/06/2015  . Diabetic foot ulcer ([place]) 07/13/2015  . AKI (acute kidney injury) ([place]) 07/13/2015  . Cellulitis 07/12/2015  . HLD (hyperlipidemia) 09/02/2012  . Peripheral nerve disease ([place]) 09/02/2012  . Type 2 diabetes mellitus with peripheral angiopathy ([place]) 09/02/2012  . Hypertriglyceridemia 07/28/2012  . Peripheral vascular disease ([place]) 07/28/2012  . Avitaminosis D 07/28/2012  . Essential (primary) hypertension 06/10/2012  . History of biliary T-tube placement 06/10/2012  . Leg  pain 06/10/2012  . Current tobacco use 06/10/2012  . Atrophic vaginitis 06/07/2012  . Type 2 diabetes mellitus ([place]) 06/07/2012   Past Medical History:  Diagnosis Date  . Diabetes mellitus without complication ([place])   . High cholesterol   . Hypertension     Family History  Problem Relation Age of Onset  . Diabetes Mother   . Hypertension Father    Past Surgical History:  Procedure Laterality Date  . ABDOMINAL HYSTERECTOMY    . AMPUTATION Left 09/06/2015   Procedure: LEFT TRANSMETATARSAL AMPUTATTION;  Surgeon: Newt Minion, MD;  Location: [place];  Service: Orthopedics;  Laterality: Left;  . PERIPHERAL VASCULAR CATHETERIZATION N/A 08/16/2015   Procedure: Abdominal Aortogram;  Surgeon: Elam Dutch, MD;  Location: [place] [facility];  Service: Cardiovascular;  Laterality: N/A;  . PERIPHERAL VASCULAR CATHETERIZATION Bilateral 08/16/2015   Procedure: Lower Extremity Angiography;  Surgeon: Elam Dutch, MD;  Location: [place] [facility];  Service: Cardiovascular;  Laterality: Bilateral;  . PERIPHERAL VASCULAR CATHETERIZATION Left 08/16/2015   Procedure: Peripheral Vascular Intervention;  Surgeon: Elam Dutch, MD;  Location: [place] [facility];  Service: Cardiovascular;  Laterality: Left;  SFA STENT X 2   Social History   Occupational History  . Not on file.   Social History Main Topics  . Smoking status: Current Every Day Smoker    Packs/day: 0.25    Types: Cigarettes  . Smokeless tobacco: Never Used  . Alcohol use 0.6 - 1.2 oz/week    1 - 2 Glasses of wine per week     Comment: on social occassions  . Drug use: No  . Sexual activity: Yes    Partners: Male

## 2015-11-04 ENCOUNTER — Other Ambulatory Visit (INDEPENDENT_AMBULATORY_CARE_PROVIDER_SITE_OTHER): Payer: Self-pay | Admitting: Orthopedic Surgery

## 2015-11-04 MED FILL — ?GLIPIZIDE 10 MG TABLET: 10 | 30 days supply | Qty: 60 | Fill #0

## 2015-11-04 MED FILL — ISOSORBIDE MN ER 60 MG TAB: 60 | 30 days supply | Qty: 30 | Fill #2

## 2015-11-04 MED FILL — ?LISINOPRIL 10 MG TABLET: 10 | 30 days supply | Qty: 30 | Fill #2

## 2015-11-04 MED FILL — CLOPIDOGREL 75 MG TABLET: 75 | 30 days supply | Qty: 30 | Fill #0

## 2015-11-04 MED FILL — ?AMLODIPINE BESYLATE 10 MG: 10 | 30 days supply | Qty: 30 | Fill #2

## 2015-11-05 ENCOUNTER — Encounter: Payer: Self-pay | Admitting: Family Medicine

## 2015-11-05 ENCOUNTER — Ambulatory Visit: Payer: Medicaid Other | Attending: Family Medicine | Admitting: Family Medicine

## 2015-11-05 VITALS — BP 176/68 | HR 63 | Temp 98.1°F | Resp 20 | Ht 64.0 in | Wt 169.0 lb

## 2015-11-05 DIAGNOSIS — Z90710 Acquired absence of both cervix and uterus: Secondary | ICD-10-CM | POA: Diagnosis not present

## 2015-11-05 DIAGNOSIS — E1140 Type 2 diabetes mellitus with diabetic neuropathy, unspecified: Secondary | ICD-10-CM | POA: Insufficient documentation

## 2015-11-05 DIAGNOSIS — E7800 Pure hypercholesterolemia, unspecified: Secondary | ICD-10-CM | POA: Diagnosis not present

## 2015-11-05 DIAGNOSIS — F17210 Nicotine dependence, cigarettes, uncomplicated: Secondary | ICD-10-CM | POA: Insufficient documentation

## 2015-11-05 DIAGNOSIS — I739 Peripheral vascular disease, unspecified: Secondary | ICD-10-CM | POA: Insufficient documentation

## 2015-11-05 DIAGNOSIS — I158 Other secondary hypertension: Secondary | ICD-10-CM | POA: Insufficient documentation

## 2015-11-05 DIAGNOSIS — Z89432 Acquired absence of left foot: Secondary | ICD-10-CM | POA: Diagnosis not present

## 2015-11-05 DIAGNOSIS — E118 Type 2 diabetes mellitus with unspecified complications: Secondary | ICD-10-CM

## 2015-11-05 DIAGNOSIS — E11621 Type 2 diabetes mellitus with foot ulcer: Secondary | ICD-10-CM

## 2015-11-05 DIAGNOSIS — Z9114 Patient's other noncompliance with medication regimen: Secondary | ICD-10-CM | POA: Diagnosis not present

## 2015-11-05 DIAGNOSIS — I10 Essential (primary) hypertension: Secondary | ICD-10-CM

## 2015-11-05 DIAGNOSIS — L97422 Non-pressure chronic ulcer of left heel and midfoot with fat layer exposed: Secondary | ICD-10-CM

## 2015-11-05 LAB — GLUCOSE, POCT (MANUAL RESULT ENTRY): POC Glucose: 148 mg/dl — AB (ref 70–99)

## 2015-11-05 MED FILL — GABAPENTIN 600 MG TABLET: 600 | 30 days supply | Qty: 90 | Fill #0

## 2015-11-05 NOTE — Patient Instructions (Signed)
Diabetes Mellitus and Food It is important for you to manage your blood sugar (glucose) level. Your blood glucose level can be greatly affected by what you eat. Eating healthier foods in the appropriate amounts throughout the day at about the same time each day will help you control your blood glucose level. It can also help slow or prevent worsening of your diabetes mellitus. Healthy eating may even help you improve the level of your blood pressure and reach or maintain a healthy weight.  General recommendations for healthful eating and cooking habits include:  Eating meals and snacks regularly. Avoid going long periods of time without eating to lose weight.  Eating a diet that consists mainly of plant-based foods, such as fruits, vegetables, nuts, legumes, and whole grains.  Using low-heat cooking methods, such as baking, instead of high-heat cooking methods, such as deep frying. Work with your dietitian to make sure you understand how to use the Nutrition Facts information on food labels. HOW CAN FOOD AFFECT ME? Carbohydrates Carbohydrates affect your blood glucose level more than any other type of food. Your dietitian will help you determine how many carbohydrates to eat at each meal and teach you how to count carbohydrates. Counting carbohydrates is important to keep your blood glucose at a healthy level, especially if you are using insulin or taking certain medicines for diabetes mellitus. Alcohol Alcohol can cause sudden decreases in blood glucose (hypoglycemia), especially if you use insulin or take certain medicines for diabetes mellitus. Hypoglycemia can be a life-threatening condition. Symptoms of hypoglycemia (sleepiness, dizziness, and disorientation) are similar to symptoms of having too much alcohol.  If your health care provider has given you approval to drink alcohol, do so in moderation and use the following guidelines:  Women should not have more than one drink per day, and men  should not have more than two drinks per day. One drink is equal to:  12 oz of beer.  5 oz of wine.  1 oz of hard liquor.  Do not drink on an empty stomach.  Keep yourself hydrated. Have water, diet soda, or unsweetened iced tea.  Regular soda, juice, and other mixers might contain a lot of carbohydrates and should be counted. WHAT FOODS ARE NOT RECOMMENDED? As you make food choices, it is important to remember that all foods are not the same. Some foods have fewer nutrients per serving than other foods, even though they might have the same number of calories or carbohydrates. It is difficult to get your body what it needs when you eat foods with fewer nutrients. Examples of foods that you should avoid that are high in calories and carbohydrates but low in nutrients include:  Trans fats (most processed foods list trans fats on the Nutrition Facts label).  Regular soda.  Juice.  Candy.  Sweets, such as cake, pie, doughnuts, and cookies.  Fried foods. WHAT FOODS CAN I EAT? Eat nutrient-rich foods, which will nourish your body and keep you healthy. The food you should eat also will depend on several factors, including:  The calories you need.  The medicines you take.  Your weight.  Your blood glucose level.  Your blood pressure level.  Your cholesterol level. You should eat a variety of foods, including:  Protein.  Lean cuts of meat.  Proteins low in saturated fats, such as fish, egg whites, and beans. Avoid processed meats.  Fruits and vegetables.  Fruits and vegetables that may help control blood glucose levels, such as apples, mangoes, and   yams.  Dairy products.  Choose fat-free or low-fat dairy products, such as milk, yogurt, and cheese.  Grains, bread, pasta, and rice.  Choose whole grain products, such as multigrain bread, whole oats, and brown rice. These foods may help control blood pressure.  Fats.  Foods containing healthful fats, such as nuts,  avocado, olive oil, canola oil, and fish. DOES EVERYONE WITH DIABETES MELLITUS HAVE THE SAME MEAL PLAN? Because every person with diabetes mellitus is different, there is not one meal plan that works for everyone. It is very important that you meet with a dietitian who will help you create a meal plan that is just right for you.   This information is not intended to replace advice given to you by your health care provider. Make sure you discuss any questions you have with your health care provider.   Document Released: 09/18/2004 Document Revised: 01/12/2014 Document Reviewed: 11/18/2012 Elsevier Interactive Patient Education 2016 [employer] Inc.  

## 2015-11-05 NOTE — Progress Notes (Signed)
F/u toe amputation.Pt has pain in her left foot pain score is a 6 CBG-148

## 2015-11-05 NOTE — Progress Notes (Signed)
Subjective:    Patient ID: Rebekah Johnson, female    DOB: 1960/06/25, 55 y.o.   MRN: [id number]  HPI She is a 55 year old female with a history of type 2 diabetes mellitus (A1c 6.1 from today), diabetic neuropathy, peripheral vascular disease (status post left SFA and popliteal stent and left SFA PTA with drug coated balloon in 08/2015), tobacco abuse, hypertension who presents today for follow-up of her hypertension. 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  At her last visit her blood pressure was in the accelerated range and she was yet to pick up her antihypertensives; today blood pressure is still elevated and she informs me she does not have the funds to pickup her medicines.  She was seen by her orthopedic-Dr. Sharol Given last week and placed on doxycycline and the dose of her gabapentin was increased but she is yet to pick up these medications as well.She has been performing dressing changes at home and also receives help from her cousin.   States she has cut down from smoking one pack of cigarettes per day to one pack of cigarettes per week.  Past Medical History:  Diagnosis Date  . Diabetes mellitus without complication ([place])   . High cholesterol   . Hypertension     Past Surgical History:  Procedure Laterality Date  . ABDOMINAL HYSTERECTOMY    . AMPUTATION Left 09/06/2015   Procedure: LEFT TRANSMETATARSAL AMPUTATTION;  Surgeon: Newt Minion, MD;  Location: [place];  Service: Orthopedics;  Laterality: Left;  . PERIPHERAL VASCULAR CATHETERIZATION N/A 08/16/2015   Procedure: Abdominal Aortogram;  Surgeon: Elam Dutch, MD;  Location: [place] [facility];  Service: Cardiovascular;  Laterality: N/A;  . PERIPHERAL VASCULAR CATHETERIZATION Bilateral 08/16/2015   Procedure: Lower Extremity Angiography;  Surgeon: Elam Dutch, MD;  Location: [place] [facility];  Service: Cardiovascular;  Laterality: Bilateral;  . PERIPHERAL VASCULAR  CATHETERIZATION Left 08/16/2015   Procedure: Peripheral Vascular Intervention;  Surgeon: Elam Dutch, MD;  Location: [place] [facility];  Service: Cardiovascular;  Laterality: Left;  SFA STENT X 2    No Known Allergies   Review of Systems Constitutional: Negative for activity change, appetite change and fatigue.  HENT: Negative for congestion, sinus pressure and sore throat.   Eyes: Negative for visual disturbance.  Respiratory: Negative for cough, chest tightness, shortness of breath and wheezing.   Cardiovascular: Negative for chest pain and palpitations.  Gastrointestinal: Negative for abdominal distention, abdominal pain and constipation.  Endocrine: Negative for polydipsia.  Genitourinary: Negative for dysuria and frequency.  Musculoskeletal: Negative for arthralgias and back pain.       See hpi  Skin: Negative for rash.  Neurological: Negative for tremors, light-headedness and numbness.  Hematological: Does not bruise/bleed easily.  Psychiatric/Behavioral: Negative for agitation and behavioral problems.    Objective: Vitals:   11/05/15 1109  BP: (!) 176/68  Pulse: 63  Resp: 20  Temp: 98.1 F (36.7 C)  TempSrc: Oral  SpO2: 99%  Weight: 169 lb (76.7 kg)  Height: 5\' 4"  (1.626 m)      Physical Exam Constitutional: She is oriented to person, place, and time. She appears well-developed and well-nourished.  Cardiovascular: Normal rate and normal heart sounds.   No murmur heard. Absent dorsalis pedis bilaterally  Pulmonary/Chest: Effort normal and breath sounds normal. She has no wheezes. She has no rales. She exhibits no tenderness.  Abdominal: Soft. Bowel sounds are normal. She exhibits no distension  and no mass. There is no tenderness.  Musculoskeletal:  Left foot transmetatarsal amputation with serous discharge.  Neurological: She is alert and oriented to person, place, and time.        Assessment & Plan:  1. Type 2 diabetes mellitus with peripheral  angiopathy (HCC) Controlled with A1c of 6.1 Continue medications - Glucose (CBG) - HgB A1c  2. Status post transmetatarsal amputation of left foot (HCC) Dressing change performed in the clinic Yet to commence antibiotic which was she was prescribed due to financial limitations I have spoken with the pharmacist to provide history refill of medications  3. Accelerated hypertension Due to noncompliance with antihypertensives Low-sodium diet  4. Peripheral vascular disease ([place]) Status post left SFA and popliteal stent, left SFA PTA with drug-coated balloon  5. Current tobacco use Continues to smoke and have explained to her that she is at high risk for worsening of peripheral vascular disease. She is not ready to quit at this time

## 2015-11-05 NOTE — Progress Notes (Signed)
Pt

## 2015-11-07 ENCOUNTER — Telehealth (INDEPENDENT_AMBULATORY_CARE_PROVIDER_SITE_OTHER): Payer: Self-pay | Admitting: Orthopedic Surgery

## 2015-11-07 NOTE — Telephone Encounter (Signed)
This was already done, I had tried to call the patient numerous times yesterday, there was no answer and no way to leave a voicemail because her voicemail is not set up yet. Forms were faxed yesterday, I called and spoke with patient on the phone to make aware we will give her a copy of form at her next office visit. She expressed understanding.

## 2015-11-07 NOTE — Telephone Encounter (Signed)
Pt has left several messages regarding disability forms that are passed due & she states she had only 10 days from 10/24 & forms was dropped off about a week and half ago.

## 2015-11-12 ENCOUNTER — Encounter (INDEPENDENT_AMBULATORY_CARE_PROVIDER_SITE_OTHER): Payer: Self-pay | Admitting: Orthopedic Surgery

## 2015-11-12 ENCOUNTER — Ambulatory Visit (INDEPENDENT_AMBULATORY_CARE_PROVIDER_SITE_OTHER): Payer: Self-pay | Admitting: Orthopedic Surgery

## 2015-11-12 VITALS — Ht 64.0 in | Wt 182.0 lb

## 2015-11-12 DIAGNOSIS — L97421 Non-pressure chronic ulcer of left heel and midfoot limited to breakdown of skin: Secondary | ICD-10-CM

## 2015-11-12 DIAGNOSIS — Z89432 Acquired absence of left foot: Secondary | ICD-10-CM

## 2015-11-12 MED ORDER — SILVER SULFADIAZINE 1 % EX CREA: 1.0000 "application " | TOPICAL_CREAM | Freq: Every day | CUTANEOUS | 0 refills | Status: DC

## 2015-11-12 NOTE — Progress Notes (Signed)
Wound Care Note   Patient: Rebekah Johnson           Date of Birth: 1960/04/20           MRN: [id number]             PCP: Arnoldo Morale, MD Visit Date: 11/12/2015   Assessment & Plan: Visit Diagnoses:  1. Status post transmetatarsal amputation of foot, left ([place])   2. Lower limb ulcer, heel or midfoot, left, limited to breakdown of skin ([facility])     Plan: Continue nitroglycerin patch changed daily continue doxycycline twice a day prescription provided for Silvadene for wound care dressing changes.   Follow-Up Instructions: Return in about 3 weeks (around 12/03/2015).  Orders:  No orders of the defined types were placed in this encounter.  Meds ordered this encounter  Medications  . silver sulfADIAZINE (SILVADENE) 1 % cream    Sig: Apply 1 application topically daily. Apply to open wound plus dry dressing change daily    Dispense:  400 g    Refill:  0      Procedures: No notes on file   Clinical Data: No additional findings.   No images are attached to the encounter.   Subjective: Chief Complaint  Patient presents with  . Left Foot - Routine Post Op, Wound Check  . Follow-up    left transmetatarsal amputation 09/06/15    Patient is here for 2 week follow up left transmetatarsal amputation on 09/06/15. She is currently 9 weeks 4 days post op. She was started on doxycycline 100mg  1 po bid, and nitroglycerin patches at last office visit. She states she is using both today. She also states she had her last cigarette 2 days ago. She is performing daily dial soap cleansing with dry dressing changes. There is maceration across anterior transmetatarsal amputation. There are wounds medial and laterally with fibrinous exudative tissue. There is no odor present. She is currently nonweightbearing with wheelchair today.     Review of Systems  Miscellaneous:  -Home Health Care: dressing changes, weekly wound check  -Physical Therapy: no  -Out of Work?:  yes     Objective: Vital Signs: Ht 5\' 4"  (1.626 m)   Wt 182 lb (82.6 kg)   BMI 31.24 kg/m   Physical Exam: Examination the area of ischemic healing of the wound is smaller there is no cellulitis there's minimal amount of clear serosanguineous drainage.  Specialty Comments: No specialty comments available.   PMFS History: Patient Active Problem List   Diagnosis Date Noted  . Status post transmetatarsal amputation of foot, left ([place]) 09/06/2015  . Diabetic foot ulcer ([place]) 07/13/2015  . AKI (acute kidney injury) ([place]) 07/13/2015  . Cellulitis 07/12/2015  . HLD (hyperlipidemia) 09/02/2012  . 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 Type 2 diabetes mellitus with peripheral angiopathy ([place]) 09/02/2012  . Hypertriglyceridemia 07/28/2012  . Peripheral vascular disease ([place]) 07/28/2012  . Avitaminosis D 07/28/2012  . Essential (primary) hypertension 06/10/2012  . History of biliary T-tube placement 06/10/2012  . Leg pain 06/10/2012  . Current tobacco use 06/10/2012  . Atrophic vaginitis 06/07/2012  . Type 2 diabetes mellitus ([place]) 06/07/2012   Past Medical History:  Diagnosis Date  . Diabetes mellitus without complication ([place])   . High cholesterol   . Hypertension     Family History  Problem Relation Age of Onset  . Diabetes Mother   . Hypertension Father    Past Surgical History:  Procedure Laterality Date  . ABDOMINAL HYSTERECTOMY    .  AMPUTATION Left 09/06/2015   Procedure: LEFT TRANSMETATARSAL AMPUTATTION;  Surgeon: Newt Minion, MD;  Location: [facility];  Service: Orthopedics;  Laterality: Left;  . PERIPHERAL VASCULAR CATHETERIZATION N/A 08/16/2015   Procedure: Abdominal Aortogram;  Surgeon: Elam Dutch, MD;  Location: [facility];  Service: Cardiovascular;  Laterality: N/A;  . PERIPHERAL VASCULAR CATHETERIZATION Bilateral 08/16/2015   Procedure: Lower Extremity Angiography;  Surgeon: Elam Dutch, MD;  Location: [facility];  Service:  Cardiovascular;  Laterality: Bilateral;  . PERIPHERAL VASCULAR CATHETERIZATION Left 08/16/2015   Procedure: Peripheral Vascular Intervention;  Surgeon: Elam Dutch, MD;  Location: [facility];  Service: Cardiovascular;  Laterality: Left;  SFA STENT X 2   Social History   Occupational History  . Not on file.   Social History Main Topics  . Smoking status: Current Every Day Smoker    Packs/day: 0.25    Types: Cigarettes  . Smokeless tobacco: Never Used  . Alcohol use 0.6 - 1.2 oz/week    1 - 2 Glasses of wine per week     Comment: on social occassions  . Drug use: No  . Sexual activity: Yes    Partners: Male

## 2015-11-14 ENCOUNTER — Ambulatory Visit: Payer: Self-pay | Attending: Family Medicine

## 2015-11-19 ENCOUNTER — Telehealth: Payer: Self-pay | Admitting: Family Medicine

## 2015-11-19 DIAGNOSIS — Z0279 Encounter for issue of other medical certificate: Secondary | ICD-10-CM | POA: Diagnosis not present

## 2015-11-19 NOTE — Telephone Encounter (Signed)
Called patient and her message letting her know that her rx for topical cream is ready to be filled up.

## 2015-12-03 ENCOUNTER — Ambulatory Visit (INDEPENDENT_AMBULATORY_CARE_PROVIDER_SITE_OTHER): Payer: Medicaid Other | Admitting: Orthopedic Surgery

## 2015-12-03 ENCOUNTER — Encounter (INDEPENDENT_AMBULATORY_CARE_PROVIDER_SITE_OTHER): Payer: Self-pay | Admitting: Orthopedic Surgery

## 2015-12-03 DIAGNOSIS — Z89432 Acquired absence of left foot: Secondary | ICD-10-CM

## 2015-12-03 DIAGNOSIS — E11621 Type 2 diabetes mellitus with foot ulcer: Secondary | ICD-10-CM | POA: Diagnosis not present

## 2015-12-03 DIAGNOSIS — L97421 Non-pressure chronic ulcer of left heel and midfoot limited to breakdown of skin: Secondary | ICD-10-CM

## 2015-12-03 NOTE — Progress Notes (Signed)
Wound Care Note   Patient: Rebekah Johnson           Date of Birth: 1960-08-12           MRN: [id number]             PCP: Arnoldo Morale, MD Visit Date: 12/03/2015   Assessment & Plan: Visit Diagnoses:  1. Status post transmetatarsal amputation of foot, left ([place])   2. Diabetic ulcer of left midfoot associated with type 2 diabetes mellitus, limited to breakdown of skin ([place])     Plan: Patient feels like her wound has started healing better with wearing the medical compression sock. She'll continue using the nitroglycerin patch she will complete her course of doxycycline. There is no signs of infection at this time. The wound has 100% beefy granulation tissue if there is any change prior to follow-up she will call us   Follow-Up Instructions: Return in about 3 weeks (around 12/24/2015).  Orders:  No orders of the defined types were placed in this encounter.  No orders of the defined types were placed in this encounter.     Procedures: No notes on file   Clinical Data: No additional findings.   No images are attached to the encounter.   Subjective: Chief Complaint  Patient presents with  . Left Foot - Follow-up, Routine Post Op    Transmetatarsal amputation left 09/06/15    Patient is here for follow up left transmetatarsal amputation with wound lateral residual limb. There is no odor, there is no redness. Patient is wearing vive compression stocking. Pain is worse with colder weather. Patient is continuing with nitroglycerin patches and she has 5 pills left.    Review of Systems   Objective: Vital Signs: There were no vitals taken for this visit.  Physical Exam: On examination patient is alert oriented no nothing well-dressed normal affect normal respiratory effort she endplates in a wheelchair. Examination her transmetatarsal amputation is healing quite nicely. She has one wound laterally which is 5 x 15 mm in 1 mm deep this has beefy granulation tissue there is no  cellulitis no odor no drainage no signs of infection. There is no tenderness to palpation. Patient has started developing equinus contracture and the importance of heel cord stretching was discussed. I disorder dressing was applied. Patient will resume her regular care with doxycycline nitroglycerin and compressive stocking.  Specialty Comments: No specialty comments available.   PMFS History: Patient Active Problem List   Diagnosis Date Noted  . Status post transmetatarsal amputation of foot, left ([place]) 09/06/2015  . Diabetic foot ulcer ([place]) 07/13/2015  . AKI (acute kidney injury) ([place]) 07/13/2015  . Cellulitis 07/12/2015  . HLD (hyperlipidemia) 09/02/2012  . 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 Type 2 diabetes mellitus with peripheral angiopathy ([place]) 09/02/2012  . Hypertriglyceridemia 07/28/2012  . Peripheral vascular disease ([place]) 07/28/2012  . Avitaminosis D 07/28/2012  . Essential (primary) hypertension 06/10/2012  . History of biliary T-tube placement 06/10/2012  . Leg pain 06/10/2012  . Current tobacco use 06/10/2012  . Atrophic vaginitis 06/07/2012  . Type 2 diabetes mellitus ([place]) 06/07/2012   Past Medical History:  Diagnosis Date  . Diabetes mellitus without complication ([place])   . High cholesterol   . Hypertension     Family History  Problem Relation Age of Onset  . Diabetes Mother   . Hypertension Father    Past Surgical History:  Procedure Laterality Date  . ABDOMINAL HYSTERECTOMY    . AMPUTATION Left  09/06/2015   Procedure: LEFT TRANSMETATARSAL AMPUTATTION;  Surgeon: Newt Minion, MD;  Location: [facility];  Service: Orthopedics;  Laterality: Left;  . PERIPHERAL VASCULAR CATHETERIZATION N/A 08/16/2015   Procedure: Abdominal Aortogram;  Surgeon: Elam Dutch, MD;  Location: [facility];  Service: Cardiovascular;  Laterality: N/A;  . PERIPHERAL VASCULAR CATHETERIZATION Bilateral 08/16/2015   Procedure: Lower Extremity Angiography;  Surgeon: Elam Dutch, MD;  Location: [facility];  Service: Cardiovascular;  Laterality: Bilateral;  . PERIPHERAL VASCULAR CATHETERIZATION Left 08/16/2015   Procedure: Peripheral Vascular Intervention;  Surgeon: Elam Dutch, MD;  Location: [facility];  Service: Cardiovascular;  Laterality: Left;  SFA STENT X 2   Social History   Occupational History  . Not on file.   Social History Main Topics  . Smoking status: Current Every Day Smoker    Packs/day: 0.25    Types: Cigarettes  . Smokeless tobacco: Never Used  . Alcohol use 0.6 - 1.2 oz/week    1 - 2 Glasses of wine per week     Comment: on social occassions  . Drug use: No  . Sexual activity: Yes    Partners: Male

## 2015-12-13 ENCOUNTER — Encounter (HOSPITAL_BASED_OUTPATIENT_CLINIC_OR_DEPARTMENT_OTHER): Payer: Self-pay | Admitting: Emergency Medicine

## 2015-12-13 ENCOUNTER — Emergency Department (HOSPITAL_BASED_OUTPATIENT_CLINIC_OR_DEPARTMENT_OTHER)
Admission: EM | Admit: 2015-12-13 | Discharge: 2015-12-13 | Disposition: A | Discharge status: Home / Self Care | Payer: Medicaid Other | Attending: Emergency Medicine | Admitting: Emergency Medicine

## 2015-12-13 ENCOUNTER — Other Ambulatory Visit (INDEPENDENT_AMBULATORY_CARE_PROVIDER_SITE_OTHER): Payer: Self-pay | Admitting: Orthopedic Surgery

## 2015-12-13 DIAGNOSIS — Z79899 Other long term (current) drug therapy: Secondary | ICD-10-CM | POA: Insufficient documentation

## 2015-12-13 DIAGNOSIS — Z7984 Long term (current) use of oral hypoglycemic drugs: Secondary | ICD-10-CM | POA: Insufficient documentation

## 2015-12-13 DIAGNOSIS — R519 Headache, unspecified: Secondary | ICD-10-CM

## 2015-12-13 DIAGNOSIS — E119 Type 2 diabetes mellitus without complications: Secondary | ICD-10-CM | POA: Insufficient documentation

## 2015-12-13 DIAGNOSIS — I10 Essential (primary) hypertension: Secondary | ICD-10-CM | POA: Diagnosis not present

## 2015-12-13 DIAGNOSIS — R51 Headache: Secondary | ICD-10-CM

## 2015-12-13 DIAGNOSIS — Z7982 Long term (current) use of aspirin: Secondary | ICD-10-CM | POA: Diagnosis not present

## 2015-12-13 DIAGNOSIS — I159 Secondary hypertension, unspecified: Secondary | ICD-10-CM | POA: Diagnosis not present

## 2015-12-13 DIAGNOSIS — F17210 Nicotine dependence, cigarettes, uncomplicated: Secondary | ICD-10-CM | POA: Diagnosis not present

## 2015-12-13 MED ORDER — LISINOPRIL 10 MG PO TABS
10.0000 mg | ORAL_TABLET | Freq: Every day | ORAL | Status: DC
  Administered 2015-12-13: 10 mg via ORAL
  Filled 2015-12-13: qty 1

## 2015-12-13 MED ORDER — CARVEDILOL 3.125 MG PO TABS
3.1250 mg | ORAL_TABLET | Freq: Two times a day (BID) | ORAL | Status: DC
  Filled 2015-12-13: qty 1

## 2015-12-13 MED ORDER — AMLODIPINE BESYLATE 5 MG PO TABS
10.0000 mg | ORAL_TABLET | Freq: Every day | ORAL | Status: DC
  Administered 2015-12-13: 10 mg via ORAL
  Filled 2015-12-13: qty 2

## 2015-12-13 MED ORDER — ACETAMINOPHEN 325 MG PO TABS
650.0000 mg | ORAL_TABLET | Freq: Once | ORAL | Status: AC
  Administered 2015-12-13: 650 mg via ORAL
  Filled 2015-12-13: qty 2

## 2015-12-13 MED FILL — AMLODIPINE BESYLATE 10 MG T: 10 | 30 days supply | Qty: 30 | Fill #3

## 2015-12-13 MED FILL — glipiZIDE 10 MG TABS: 10 | 30 days supply | Qty: 60 | Fill #1

## 2015-12-13 MED FILL — GABAPENTIN 600 MG TABLET: 600 | 30 days supply | Qty: 90 | Fill #0

## 2015-12-13 MED FILL — CLOPIDOGREL 75 MG TABLET: 75 | 30 days supply | Qty: 30 | Fill #1

## 2015-12-13 MED FILL — LISINOPRIL 10 MG TABLET: 10 | 30 days supply | Qty: 30 | Fill #3

## 2015-12-13 MED FILL — ISOSORBIDE MN ER 60 MG TAB: 60 | 30 days supply | Qty: 30 | Fill #3

## 2015-12-13 MED FILL — NITROGLYCERIN 0.2 MG/HR PAT: 0.2 | 30 days supply | Qty: 30 | Fill #1

## 2015-12-13 MED FILL — DOXYCYCLINE 100 MG TABLET: 100 | 30 days supply | Qty: 60 | Fill #0

## 2015-12-13 NOTE — ED Provider Notes (Signed)
[facility] Provider Note   CSN: [id number] Arrival date & time: 12/13/15  0946     History   Chief Complaint Chief Complaint  Patient presents with  . Hypertension    HPI Rebekah Johnson is a 55 y.o. female.  HPI Rebekah Johnson is a 54 y.o. female with history of diabetes, hypertension, presents to emergency department complaining of high blood pressure. Patient states that she ran out of her medications 2 days ago. She states that she ran out of money and unable to refill her prescription due to financial issues. She reports that she may be able to borrow some money and get them filled. Today her blood pressure was reading 993Z systolic over 169 diastolic at home. She states this scared her so she came to the emergency department by ambulance. She does report mild headache that started yesterday. She states headache is dull, gradual in onset. Denies any associated nausea, vomiting, blurred vision. Denies any weakness or numbness in extremities. She states she has some Tylenol but did not take it prior to coming in.  Past Medical History:  Diagnosis Date  . Diabetes mellitus without complication ([place])   . High cholesterol   . Hypertension     Patient Active Problem List   Diagnosis Date Noted  . Status post transmetatarsal amputation of foot, left ([place]) 09/06/2015  . Diabetic foot ulcer ([place]) 07/13/2015  . AKI (acute kidney injury) ([place]) 07/13/2015  . Cellulitis 07/12/2015  . HLD (hyperlipidemia) 09/02/2012  . 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 Type 2 diabetes mellitus with peripheral angiopathy ([place]) 09/02/2012  . Hypertriglyceridemia 07/28/2012  . Peripheral vascular disease ([place]) 07/28/2012  . Avitaminosis D 07/28/2012  . Essential (primary) hypertension 06/10/2012  . History of biliary T-tube placement 06/10/2012  . Leg pain 06/10/2012  . Current tobacco use 06/10/2012  . Atrophic vaginitis 06/07/2012  . Type 2 diabetes mellitus ([place])  06/07/2012    Past Surgical History:  Procedure Laterality Date  . ABDOMINAL HYSTERECTOMY    . AMPUTATION Left 09/06/2015   Procedure: LEFT TRANSMETATARSAL AMPUTATTION;  Surgeon: Newt Minion, MD;  Location: [facility];  Service: Orthopedics;  Laterality: Left;  . PERIPHERAL VASCULAR CATHETERIZATION N/A 08/16/2015   Procedure: Abdominal Aortogram;  Surgeon: Elam Dutch, MD;  Location: [facility];  Service: Cardiovascular;  Laterality: N/A;  . PERIPHERAL VASCULAR CATHETERIZATION Bilateral 08/16/2015   Procedure: Lower Extremity Angiography;  Surgeon: Elam Dutch, MD;  Location: [facility];  Service: Cardiovascular;  Laterality: Bilateral;  . PERIPHERAL VASCULAR CATHETERIZATION Left 08/16/2015   Procedure: Peripheral Vascular Intervention;  Surgeon: Elam Dutch, MD;  Location: [facility];  Service: Cardiovascular;  Laterality: Left;  SFA STENT X 2    OB History    No data available       Home Medications    Prior to Admission medications   Medication Sig Start Date End Date Taking? Authorizing Provider  amLODipine (NORVASC) 10 MG tablet Take 1 tablet (10 mg total) by mouth daily. Reported on 07/13/2015 08/07/15 08/06/16  Arnoldo Morale, MD  aspirin 81 MG EC tablet Take 1 tablet (81 mg total) by mouth daily. Reported on 07/13/2015 08/07/15   Arnoldo Morale, MD  atorvastatin (LIPITOR) 40 MG tablet Take 1 tablet (40 mg total) by mouth daily. Reported on 07/13/2015 08/07/15   Arnoldo Morale, MD  blood glucose meter kit and supplies KIT Dispense based on patient and insurance preference. Use up to four times daily as  directed. (FOR ICD-9 250.00, 250.01). 07/25/15   Kristen N Ward, DO  carvedilol (COREG) 3.125 MG tablet Take 1 tablet (3.125 mg total) by mouth 2 (two) times daily with a meal. Reported on 07/03/2015 08/07/15   Arnoldo Morale, MD  Cholecalciferol (VITAMIN D-3) 1000 units CAPS Take 1 capsule by mouth daily.    Historical Provider, MD  clopidogrel (PLAVIX) 75 MG tablet Take 1  tablet (75 mg total) by mouth daily. 08/16/15   Alvia Grove, PA-C  doxycycline (VIBRA-TABS) 100 MG tablet Take 1 tablet (100 mg total) by mouth 2 (two) times daily. 10/29/15   Newt Minion, MD  Estradiol-Norethindrone Acet 0.5-0.1 MG tablet Take 1 tablet by mouth daily. 08/08/15   Arnoldo Morale, MD  gabapentin (NEURONTIN) 600 MG tablet TAKE ONE TABLET BY MOUTH THREE TIMES DAILY AS NEEDED FOR NEUROPATHY PAIN 11/04/15   Newt Minion, MD  gemfibrozil (LOPID) 600 MG tablet Take 1 tablet by mouth 2 (two) times daily. Reported on 07/13/2015 11/13/14 11/13/15  Barrie Lyme, FNP  glipiZIDE (GLUCOTROL) 10 MG tablet Take 1 tablet (10 mg total) by mouth 2 (two) times daily before a meal. 08/07/15   Arnoldo Morale, MD  glucose blood test strip  07/25/15   Historical Provider, MD  isosorbide mononitrate (IMDUR) 60 MG 24 hr tablet Take 1 tablet (60 mg total) by mouth daily. 08/07/15   Arnoldo Morale, MD  lisinopril (PRINIVIL,ZESTRIL) 10 MG tablet Take 1 tablet (10 mg total) by mouth daily. 08/07/15   Arnoldo Morale, MD  nitroGLYCERIN (NITRODUR - DOSED IN MG/24 HR) 0.2 mg/hr patch Place 1 patch (0.2 mg total) onto the skin daily. 10/29/15   Newt Minion, MD  Omega-3 Fatty Acids (FISH OIL) 1000 MG CAPS Take 1 capsule by mouth daily. Reported on 07/13/2015    Historical Provider, MD  oxyCODONE-acetaminophen (PERCOCET/ROXICET) 5-325 MG tablet Take 1-2 tablets by mouth every 6 (six) hours as needed. Patient not taking: Reported on 12/03/2015 07/25/15   Delice Bison Ward, DO  silver sulfADIAZINE (SILVADENE) 1 % cream Apply 1 application topically daily. Apply to open wound plus dry dressing change daily 11/12/15   Newt Minion, MD  sitaGLIPtin (JANUVIA) 50 MG tablet Take 1 tablet (50 mg total) by mouth daily. 08/22/15   Arnoldo Morale, MD  TRUEPLUS LANCETS 28G MISC  07/25/15   Historical Provider, MD    Family History Family History  Problem Relation Age of Onset  . Diabetes Mother   . Hypertension Father     Social History Social  History  Substance Use Topics  . Smoking status: Current Every Day Smoker    Packs/day: 0.25    Types: Cigarettes  . Smokeless tobacco: Never Used  . Alcohol use 0.6 - 1.2 oz/week    1 - 2 Glasses of wine per week     Comment: on social occassions     Allergies   Patient has no known allergies.   Review of Systems Review of Systems  Constitutional: Negative for chills and fever.  Respiratory: Negative for cough, chest tightness and shortness of breath.   Cardiovascular: Negative for chest pain, palpitations and leg swelling.  Gastrointestinal: Negative for abdominal pain, diarrhea, nausea and vomiting.  Genitourinary: Negative for dysuria, flank pain and pelvic pain.  Musculoskeletal: Negative for arthralgias, myalgias, neck pain and neck stiffness.  Skin: Negative for rash.  Neurological: Positive for headaches. Negative for dizziness and weakness.  All other systems reviewed and are negative.    Physical Exam Updated  Vital Signs BP (!) 207/75   Pulse 68   Temp 98.1 F (36.7 C) (Oral)   Resp 18   Ht _0  (1.626 m)   Wt 77.1 kg   SpO2 100%   BMI 29.18 kg/m   Physical Exam  Constitutional: She is oriented to person, place, and time. She appears well-developed and well-nourished. No distress.  HENT:  Head: Normocephalic.  Eyes: Conjunctivae and EOM are normal. Pupils are equal, round, and reactive to light.  Neck: Neck supple.  Cardiovascular: Normal rate, regular rhythm and normal heart sounds.   Pulmonary/Chest: Effort normal and breath sounds normal. No respiratory distress. She has no wheezes. She has no rales.  Abdominal: Soft. Bowel sounds are normal. She exhibits no distension. There is no tenderness. There is no rebound.  Musculoskeletal: She exhibits no edema.  Neurological: She is alert and oriented to person, place, and time.  5/5 and equal upper and lower extremity strength bilaterally. Equal grip strength bilaterally. Normal finger to nose and heel to  shin. No pronator drift.   Skin: Skin is warm and dry.  Psychiatric: She has a normal mood and affect. Her behavior is normal.  Nursing note and vitals reviewed.    ED Treatments / Results  Labs (all labs ordered are listed, but only abnormal results are displayed) Labs Reviewed - No data to display  EKG  EKG Interpretation None       Radiology No results found.  Procedures Procedures (including critical care time)  Medications Ordered in ED Medications  amLODipine (NORVASC) tablet 10 mg (10 mg Oral Given 12/13/15 1043)  lisinopril (PRINIVIL,ZESTRIL) tablet 10 mg (10 mg Oral Given 12/13/15 1043)  carvedilol (COREG) tablet 3.125 mg (not administered)  acetaminophen (TYLENOL) tablet 650 mg (not administered)     Initial Impression / Assessment and Plan / ED Course  I have reviewed the triage vital signs and the nursing notes.  Pertinent labs & imaging results that were available during my care of the patient were reviewed by me and considered in my medical decision making (see chart for details).  Clinical Course    Patient in the emergency department with elevated blood pressure. She has not had her medications in 2 days. She is complaining of mild headache, normal exam, no concern for intracranial hemorrhage. Headache is gradual in onset, dull, noted neuro deficits or neurological complaints. Will give Tylenol for headache, will give dose of her regular blood pressure medications here in the ED. BP is 207/75. Will recheck after treatment.   12:34 PM Pt states headache improved. BP down to 031R systolic. Pt states she will borrow some money to fill her medications. Pt fills them at [facility]. Will dc home with close follow up.   Vitals:   12/13/15 0950 12/13/15 0952 12/13/15 1131  BP:  (!) 207/75 189/69  Pulse:  68 (!) 57  Resp:  18 18  Temp:  98.1 F (36.7 C)   TempSrc:  Oral   SpO2:  100% 100%  Weight: 77.1 kg    Height: _1  (1.626 m)       Final Clinical Impressions(s) / ED Diagnoses   Final diagnoses:  Secondary hypertension  Nonintractable headache, unspecified chronicity pattern, unspecified headache type    New Prescriptions New Prescriptions   No medications on file     Jeannett Senior, PA-C 12/13/15 1242    Veryl Speak, MD 12/13/15 1347

## 2015-12-13 NOTE — Discharge Instructions (Signed)
Watch your diet closely, avoid high salt foods. Take your blood pressure medications. Follow up with your doctor as soon as able for recheck. Return if worsening symptoms.

## 2015-12-13 NOTE — ED Triage Notes (Signed)
Pt out of BP meds x 2 days HA since last night

## 2015-12-18 ENCOUNTER — Ambulatory Visit: Payer: Medicaid Other | Attending: Family Medicine | Admitting: Family Medicine

## 2015-12-18 ENCOUNTER — Encounter: Payer: Self-pay | Admitting: Family Medicine

## 2015-12-18 VITALS — BP 200/83 | HR 72 | Temp 98.2°F | Ht 64.0 in | Wt 164.4 lb

## 2015-12-18 DIAGNOSIS — E1140 Type 2 diabetes mellitus with diabetic neuropathy, unspecified: Secondary | ICD-10-CM | POA: Diagnosis not present

## 2015-12-18 DIAGNOSIS — Z7902 Long term (current) use of antithrombotics/antiplatelets: Secondary | ICD-10-CM | POA: Diagnosis not present

## 2015-12-18 DIAGNOSIS — E1151 Type 2 diabetes mellitus with diabetic peripheral angiopathy without gangrene: Secondary | ICD-10-CM | POA: Insufficient documentation

## 2015-12-18 DIAGNOSIS — Z114 Encounter for screening for human immunodeficiency virus [HIV]: Secondary | ICD-10-CM | POA: Diagnosis not present

## 2015-12-18 DIAGNOSIS — R221 Localized swelling, mass and lump, neck: Secondary | ICD-10-CM

## 2015-12-18 DIAGNOSIS — Z1159 Encounter for screening for other viral diseases: Secondary | ICD-10-CM

## 2015-12-18 DIAGNOSIS — E1149 Type 2 diabetes mellitus with other diabetic neurological complication: Secondary | ICD-10-CM | POA: Diagnosis not present

## 2015-12-18 DIAGNOSIS — F17210 Nicotine dependence, cigarettes, uncomplicated: Secondary | ICD-10-CM | POA: Insufficient documentation

## 2015-12-18 DIAGNOSIS — Z89432 Acquired absence of left foot: Secondary | ICD-10-CM

## 2015-12-18 DIAGNOSIS — Z7982 Long term (current) use of aspirin: Secondary | ICD-10-CM | POA: Diagnosis not present

## 2015-12-18 DIAGNOSIS — I739 Peripheral vascular disease, unspecified: Secondary | ICD-10-CM

## 2015-12-18 DIAGNOSIS — I10 Essential (primary) hypertension: Secondary | ICD-10-CM

## 2015-12-18 DIAGNOSIS — L97421 Non-pressure chronic ulcer of left heel and midfoot limited to breakdown of skin: Secondary | ICD-10-CM | POA: Insufficient documentation

## 2015-12-18 DIAGNOSIS — Z9114 Patient's other noncompliance with medication regimen: Secondary | ICD-10-CM | POA: Diagnosis not present

## 2015-12-18 DIAGNOSIS — Z79899 Other long term (current) drug therapy: Secondary | ICD-10-CM | POA: Diagnosis not present

## 2015-12-18 DIAGNOSIS — E7800 Pure hypercholesterolemia, unspecified: Secondary | ICD-10-CM

## 2015-12-18 DIAGNOSIS — E11621 Type 2 diabetes mellitus with foot ulcer: Secondary | ICD-10-CM | POA: Diagnosis not present

## 2015-12-18 LAB — COMPLETE METABOLIC PANEL WITH GFR
ALT: 11 U/L (ref 6–29)
AST: 13 U/L (ref 10–35)
Albumin: 3.8 g/dL (ref 3.6–5.1)
Alkaline Phosphatase: 101 U/L (ref 33–130)
BUN: 10 mg/dL (ref 7–25)
CHLORIDE: 103 mmol/L (ref 98–110)
CO2: 30 mmol/L (ref 20–31)
Calcium: 10.1 mg/dL (ref 8.6–10.4)
Creat: 1.19 mg/dL — ABNORMAL HIGH (ref 0.50–1.05)
GFR, EST AFRICAN AMERICAN: 59 mL/min — AB (ref >=60)
GFR, EST NON AFRICAN AMERICAN: 51 mL/min — AB (ref >=60)
GLUCOSE: 194 mg/dL — AB (ref 65–99)
Potassium: 4 mmol/L (ref 3.5–5.3)
SODIUM: 142 mmol/L (ref 135–146)
TOTAL PROTEIN: 7.2 g/dL (ref 6.1–8.1)
Total Bilirubin: 0.3 mg/dL (ref 0.2–1.2)

## 2015-12-18 LAB — GLUCOSE, POCT (MANUAL RESULT ENTRY): POC GLUCOSE: 223 mg/dL — AB (ref 70–99)

## 2015-12-18 MED ORDER — GABAPENTIN 600 MG PO TABS: ORAL_TABLET | ORAL | 3 refills | Status: DC

## 2015-12-18 MED ORDER — CLONIDINE HCL 0.1 MG PO TABS
0.2000 mg | ORAL_TABLET | Freq: Once | ORAL | Status: AC
  Administered 2015-12-18: 0.2 mg via ORAL

## 2015-12-18 MED ORDER — ATORVASTATIN CALCIUM 40 MG PO TABS: 40.0000 mg | ORAL_TABLET | Freq: Every day | ORAL | 3 refills | Status: DC

## 2015-12-18 MED ORDER — ISOSORBIDE MONONITRATE ER 60 MG PO TB24: 60.0000 mg | ORAL_TABLET | Freq: Every day | ORAL | 3 refills | Status: DC

## 2015-12-18 MED ORDER — GLIPIZIDE 10 MG PO TABS: 10.0000 mg | ORAL_TABLET | Freq: Two times a day (BID) | ORAL | 3 refills | Status: DC

## 2015-12-18 MED ORDER — AMLODIPINE BESYLATE 10 MG PO TABS: 10.0000 mg | ORAL_TABLET | Freq: Every day | ORAL | 3 refills | Status: DC

## 2015-12-18 MED ORDER — CLOPIDOGREL BISULFATE 75 MG PO TABS: 75.0000 mg | ORAL_TABLET | Freq: Every day | ORAL | 3 refills | Status: DC

## 2015-12-18 MED ORDER — LISINOPRIL 10 MG PO TABS: 10.0000 mg | ORAL_TABLET | Freq: Every day | ORAL | 3 refills | Status: DC

## 2015-12-18 MED ORDER — CARVEDILOL 3.125 MG PO TABS: 3.1250 mg | ORAL_TABLET | Freq: Two times a day (BID) | ORAL | 3 refills | Status: DC

## 2015-12-18 MED ORDER — SITAGLIPTIN PHOSPHATE 50 MG PO TABS: 50.0000 mg | ORAL_TABLET | Freq: Every day | ORAL | 3 refills | Status: DC

## 2015-12-18 MED FILL — JANUVIA 50 MG TABLET: 50 | 30 days supply | Qty: 30 | Fill #0

## 2015-12-18 MED FILL — ATORVASTATIN 40 MG TABLET: 40 | 30 days supply | Qty: 30 | Fill #0

## 2015-12-18 NOTE — Patient Instructions (Signed)
Hypertension Hypertension, commonly called high blood pressure, is when the force of blood pumping through your arteries is too strong. Your arteries are the blood vessels that carry blood from your heart throughout your body. A blood pressure reading consists of a higher number over a lower number, such as 110/72. The higher number (systolic) is the pressure inside your arteries when your heart pumps. The lower number (diastolic) is the pressure inside your arteries when your heart relaxes. Ideally you want your blood pressure below 120/80. Hypertension forces your heart to work harder to pump blood. Your arteries may become narrow or stiff. Having untreated or uncontrolled hypertension can cause heart attack, stroke, kidney disease, and other problems. What increases the risk? Some risk factors for high blood pressure are controllable. Others are not. Risk factors you cannot control include:  Race. You may be at higher risk if you are African American.  Age. Risk increases with age.  Gender. Men are at higher risk than women before age 45 years. After age 65, women are at higher risk than men. Risk factors you can control include:  Not getting enough exercise or physical activity.  Being overweight.  Getting too much fat, sugar, calories, or salt in your diet.  Drinking too much alcohol. What are the signs or symptoms? Hypertension does not usually cause signs or symptoms. Extremely high blood pressure (hypertensive crisis) may cause headache, anxiety, shortness of breath, and nosebleed. How is this diagnosed? To check if you have hypertension, your health care provider will measure your blood pressure while you are seated, with your arm held at the level of your heart. It should be measured at least twice using the same arm. Certain conditions can cause a difference in blood pressure between your right and left arms. A blood pressure reading that is higher than normal on one occasion does  not mean that you need treatment. If it is not clear whether you have high blood pressure, you may be asked to return on a different day to have your blood pressure checked again. Or, you may be asked to monitor your blood pressure at home for 1 or more weeks. How is this treated? Treating high blood pressure includes making lifestyle changes and possibly taking medicine. Living a healthy lifestyle can help lower high blood pressure. You may need to change some of your habits. Lifestyle changes may include:  Following the DASH diet. This diet is high in fruits, vegetables, and whole grains. It is low in salt, red meat, and added sugars.  Keep your sodium intake below 2,300 mg per day.  Getting at least 30-45 minutes of aerobic exercise at least 4 times per week.  Losing weight if necessary.  Not smoking.  Limiting alcoholic beverages.  Learning ways to reduce stress. Your health care provider may prescribe medicine if lifestyle changes are not enough to get your blood pressure under control, and if one of the following is true:  You are 18-59 years of age and your systolic blood pressure is above 140.  You are 60 years of age or older, and your systolic blood pressure is above 150.  Your diastolic blood pressure is above 90.  You have diabetes, and your systolic blood pressure is over 140 or your diastolic blood pressure is over 90.  You have kidney disease and your blood pressure is above 140/90.  You have heart disease and your blood pressure is above 140/90. Your personal target blood pressure may vary depending on your medical   conditions, your age, and other factors. Follow these instructions at home:  Have your blood pressure rechecked as directed by your health care provider.  Take medicines only as directed by your health care provider. Follow the directions carefully. Blood pressure medicines must be taken as prescribed. The medicine does not work as well when you skip  doses. Skipping doses also puts you at risk for problems.  Do not smoke.  Monitor your blood pressure at home as directed by your health care provider. Contact a health care provider if:  You think you are having a reaction to medicines taken.  You have recurrent headaches or feel dizzy.  You have swelling in your ankles.  You have trouble with your vision. Get help right away if:  You develop a severe headache or confusion.  You have unusual weakness, numbness, or feel faint.  You have severe chest or abdominal pain.  You vomit repeatedly.  You have trouble breathing. This information is not intended to replace advice given to you by your health care provider. Make sure you discuss any questions you have with your health care provider. Document Released: 12/22/2004 Document Revised: 05/30/2015 Document Reviewed: 10/14/2012 Elsevier Interactive Patient Education  2017 Elsevier Inc.  

## 2015-12-18 NOTE — Progress Notes (Signed)
Subjective:  Patient ID: Rebekah Johnson, female    DOB: October 04, 1960  Age: 55 y.o. MRN: [id number]  CC: Diabetes; Hypertension; and neck lump   HPI Rebekah Johnson is a 55 year old female with a history of type 2 diabetes mellitus (A1c 6.1 from today), diabetic neuropathy, peripheral vascular disease (status post left SFA and popliteal stent and left SFA PTA with drug coated balloon in 08/2015), tobacco abuse, hypertension who presents today for follow-up of her hypertension. 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  Her blood pressure is in the accelerated range and she endorses running out of her medications . Her random blood sugars have been in the 190 range.  Her left foot partial amputation seems to be doing better and she has an upcoming appointment with Dr. Sharol Given next week. She has been performing dressing changes at home and also receives help from her cousin.   States she has cut down from smoking one pack of cigarettes per day to one pack of cigarettes per week. She complains of noticing a nontender lump right side of her neck on waking this morning. Denies numbness in other body parts or change in her voice.  Past Medical History:  Diagnosis Date  . Diabetes mellitus without complication ([place])   . High cholesterol   . Hypertension     Past Surgical History:  Procedure Laterality Date  . ABDOMINAL HYSTERECTOMY    . AMPUTATION Left 09/06/2015   Procedure: LEFT TRANSMETATARSAL AMPUTATTION;  Surgeon: Newt Minion, MD;  Location: [facility];  Service: Orthopedics;  Laterality: Left;  . PERIPHERAL VASCULAR CATHETERIZATION N/A 08/16/2015   Procedure: Abdominal Aortogram;  Surgeon: Elam Dutch, MD;  Location: [facility];  Service: Cardiovascular;  Laterality: N/A;  . PERIPHERAL VASCULAR CATHETERIZATION Bilateral 08/16/2015   Procedure: Lower Extremity Angiography;  Surgeon: Elam Dutch, MD;  Location: [facility];   Service: Cardiovascular;  Laterality: Bilateral;  . PERIPHERAL VASCULAR CATHETERIZATION Left 08/16/2015   Procedure: Peripheral Vascular Intervention;  Surgeon: Elam Dutch, MD;  Location: [facility];  Service: Cardiovascular;  Laterality: Left;  SFA STENT X 2    No Known Allergies   Outpatient Medications Prior to Visit  Medication Sig Dispense Refill  . aspirin 81 MG EC tablet Take 1 tablet (81 mg total) by mouth daily. Reported on 07/13/2015 30 tablet 3  . blood glucose meter kit and supplies KIT Dispense based on patient and insurance preference. Use up to four times daily as directed. (FOR ICD-9 250.00, 250.01). 1 each 3  . Cholecalciferol (VITAMIN D-3) 1000 units CAPS Take 1 capsule by mouth daily.    [name] glucose blood test strip   3  . Omega-3 Fatty Acids (FISH OIL) 1000 MG CAPS Take 1 capsule by mouth daily. Reported on 07/13/2015    . TRUEPLUS LANCETS 28G MISC   3  . doxycycline (VIBRA-TABS) 100 MG tablet Take 1 tablet (100 mg total) by mouth 2 (two) times daily. (Patient not taking: Reported on 12/18/2015) 60 tablet 0  . gemfibrozil (LOPID) 600 MG tablet Take 1 tablet by mouth 2 (two) times daily. Reported on 07/13/2015    . nitroGLYCERIN (NITRODUR - DOSED IN MG/24 HR) 0.2 mg/hr patch Place 1 patch (0.2 mg total) onto the skin daily. (Patient not taking: Reported on 12/18/2015) 30 patch 3  . silver sulfADIAZINE (SILVADENE) 1 % cream Apply 1 application topically daily. Apply to open wound plus dry  dressing change daily (Patient not taking: Reported on 12/18/2015) 400 g 0  . amLODipine (NORVASC) 10 MG tablet Take 1 tablet (10 mg total) by mouth daily. Reported on 07/13/2015 (Patient not taking: Reported on 12/18/2015) 30 tablet 3  . atorvastatin (LIPITOR) 40 MG tablet Take 1 tablet (40 mg total) by mouth daily. Reported on 07/13/2015 (Patient not taking: Reported on 12/18/2015) 30 tablet 3  . carvedilol (COREG) 3.125 MG tablet Take 1 tablet (3.125 mg total) by mouth 2 (two) times  daily with a meal. Reported on 07/03/2015 (Patient not taking: Reported on 12/18/2015) 60 tablet 3  . clopidogrel (PLAVIX) 75 MG tablet Take 1 tablet (75 mg total) by mouth daily. (Patient not taking: Reported on 12/18/2015) 30 tablet 11  . Estradiol-Norethindrone Acet 0.5-0.1 MG tablet Take 1 tablet by mouth daily. (Patient not taking: Reported on 12/18/2015) 30 tablet 1  . gabapentin (NEURONTIN) 600 MG tablet TAKE ONE TABLET BY MOUTH THREE TIMES DAILY AS NEEDED FOR NEUROPATHY PAIN (Patient not taking: Reported on 12/18/2015) 90 tablet 0  . glipiZIDE (GLUCOTROL) 10 MG tablet Take 1 tablet (10 mg total) by mouth 2 (two) times daily before a meal. (Patient not taking: Reported on 12/18/2015) 60 tablet 3  . isosorbide mononitrate (IMDUR) 60 MG 24 hr tablet Take 1 tablet (60 mg total) by mouth daily. (Patient not taking: Reported on 12/18/2015) 30 tablet 3  . lisinopril (PRINIVIL,ZESTRIL) 10 MG tablet Take 1 tablet (10 mg total) by mouth daily. (Patient not taking: Reported on 12/18/2015) 30 tablet 3  . oxyCODONE-acetaminophen (PERCOCET/ROXICET) 5-325 MG tablet Take 1-2 tablets by mouth every 6 (six) hours as needed. (Patient not taking: Reported on 12/03/2015) 25 tablet 0  . sitaGLIPtin (JANUVIA) 50 MG tablet Take 1 tablet (50 mg total) by mouth daily. (Patient not taking: Reported on 12/18/2015) 90 tablet 3   No facility-administered medications prior to visit.     ROS Review of Systems Constitutional: Negative for activity change, appetite change and fatigue.  HENT: Negative for congestion, sinus pressure and sore throat.   Eyes: Negative for visual disturbance.  Respiratory: Negative for cough, chest tightness, shortness of breath and wheezing.   Cardiovascular: Negative for chest pain and palpitations.  Gastrointestinal: Negative for abdominal distention, abdominal pain and constipation.  Endocrine: Negative for polydipsia.  Genitourinary: Negative for dysuria and frequency.  Musculoskeletal:  Negative for arthralgias and back pain.       See hpi  Skin: Negative for rash.  Neurological: Negative for tremors, light-headedness and numbness.  Hematological: Does not bruise/bleed easily.  Psychiatric/Behavioral: Negative for agitation and behavioral problems.  Objective:  BP (!) 200/83 (BP Location: Right Arm, Patient Position: Sitting, Cuff Size: Large)   Pulse 72   Temp 98.2 F (36.8 C) (Oral)   Ht _0  (1.626 m)   Wt 164 lb 6.4 oz (74.6 kg)   SpO2 92%   BMI 28.22 kg/m   BP/Weight 12/18/2015 12/13/2015 93/07/1694  Systolic BP 789 381 -  Diastolic BP 83 69 -  Wt. (Lbs) 164.4 170 182  BMI 28.22 29.18 31.24      Physical Exam Constitutional: She is oriented to person, place, and time. She appears well-developed and well-nourished.  Cardiovascular: Normal rate and normal heart sounds.   No murmur heard. Absent dorsalis pedis bilaterally  Pulmonary/Chest: Effort normal and breath sounds normal. She has no wheezes. She has no rales. She exhibits no tenderness.  Abdominal: Soft. Bowel sounds are normal. She exhibits no distension and no mass. There is  no tenderness.  Musculoskeletal:  Left foot transmetatarsal amputation with minimal serous discharge from a small open portion .  Neurological: She is alert and oriented to person, place, and time.   Lab Results  Component Value Date   HGBA1C 6.1 10/23/2015    Lipid Panel     Component Value Date/Time   CHOL 123 (L) 08/07/2015 1056   TRIG 154 (H) 08/07/2015 1056   HDL 39 (L) 08/07/2015 1056   CHOLHDL 3.2 08/07/2015 1056   VLDL 31 (H) 08/07/2015 1056   LDLCALC 53 08/07/2015 1056    CMP Latest Ref Rng & Units 09/06/2015 08/16/2015 08/07/2015  Glucose 65 - 99 mg/dL 105(H) 158(H) 217(H)  BUN 6 - 20 mg/dL _0 Creatinine 0.44 - 1.00 mg/dL 1.06(H) 1.00 1.05  Sodium 135 - 145 mmol/L 140 142 140  Potassium 3.5 - 5.1 mmol/L 3.7 3.3(L) 3.9  Chloride 101 - 111 mmol/L 104 100(L) 102  CO2 22 - 32 mmol/L 28 - 25  Calcium  8.9 - 10.3 mg/dL 9.7 - 9.8  Total Protein 6.1 - 8.1 g/dL - - 7.3  Total Bilirubin 0.2 - 1.2 mg/dL - - 0.3  Alkaline Phos 33 - 130 U/L - - 91  AST 10 - 35 U/L - - 14  ALT 6 - 29 U/L - - 13     Assessment & Plan:   1. Other diabetic neurological complication associated with type 2 diabetes mellitus ([place]) Last a1c was 6.1 Likely to be elevated When rechecked due to noncompliance with medications Stressed the importance of compliance - Glucose (CBG) - sitaGLIPtin (JANUVIA) 50 MG tablet; Take 1 tablet (50 mg total) by mouth daily.  Dispense: 90 tablet; Refill: 3 - glipiZIDE (GLUCOTROL) 10 MG tablet; Take 1 tablet (10 mg total) by mouth 2 (two) times daily before a meal.  Dispense: 60 tablet; Refill: 3 - gabapentin (NEURONTIN) 600 MG tablet; TAKE ONE TABLET BY MOUTH THREE TIMES DAILY AS NEEDED FOR NEUROPATHY PAIN  Dispense: 90 tablet; Refill: 3 - COMPLETE METABOLIC PANEL WITH GFR  2. Essential (primary) hypertension Uncontrolled due to running out of medications Clonidine 0.2 administered and blood pressure rechecked Low-sodium diet - lisinopril (PRINIVIL,ZESTRIL) 10 MG tablet; Take 1 tablet (10 mg total) by mouth daily.  Dispense: 30 tablet; Refill: 3 - isosorbide mononitrate (IMDUR) 60 MG 24 hr tablet; Take 1 tablet (60 mg total) by mouth daily.  Dispense: 30 tablet; Refill: 3 - carvedilol (COREG) 3.125 MG tablet; Take 1 tablet (3.125 mg total) by mouth 2 (two) times daily with a meal. Reported on 07/03/2015  Dispense: 60 tablet; Refill: 3 - amLODipine (NORVASC) 10 MG tablet; Take 1 tablet (10 mg total) by mouth daily. Reported on 07/13/2015  Dispense: 30 tablet; Refill: 3  3. Pure hypercholesterolemia HDL is on the low side Physical activity as tolerated - atorvastatin (LIPITOR) 40 MG tablet; Take 1 tablet (40 mg total) by mouth daily. Reported on 07/13/2015  Dispense: 30 tablet; Refill: 3  4. Lump on neck Given acute onset of  a few hours, Will need to observe. We'll see back at next  visit and pursue neck ultrasound if it persist  5. Peripheral vascular disease ([place]) S/p L SFA stent x 2 - clopidogrel (PLAVIX) 75 MG tablet; Take 1 tablet (75 mg total) by mouth daily.  Dispense: 30 tablet; Refill: 3  6. Diabetic ulcer of left midfoot associated with type 2 diabetes mellitus, limited to breakdown of skin ([place]) Dressing change performed in the clinic Continue  dressing changes at home Keep appointment with orthopedics-Dr. due   7. Status post transmetatarsal amputation of foot, left ([place])   8. Screening for viral disease - HIV antibody (with reflex) - Hepatitis C antibody, reflex   Meds ordered this encounter  Medications  . sitaGLIPtin (JANUVIA) 50 MG tablet    Sig: Take 1 tablet (50 mg total) by mouth daily.    Dispense:  90 tablet    Refill:  3  . lisinopril (PRINIVIL,ZESTRIL) 10 MG tablet    Sig: Take 1 tablet (10 mg total) by mouth daily.    Dispense:  30 tablet    Refill:  3  . isosorbide mononitrate (IMDUR) 60 MG 24 hr tablet    Sig: Take 1 tablet (60 mg total) by mouth daily.    Dispense:  30 tablet    Refill:  3  . glipiZIDE (GLUCOTROL) 10 MG tablet    Sig: Take 1 tablet (10 mg total) by mouth 2 (two) times daily before a meal.    Dispense:  60 tablet    Refill:  3  . gabapentin (NEURONTIN) 600 MG tablet    Sig: TAKE ONE TABLET BY MOUTH THREE TIMES DAILY AS NEEDED FOR NEUROPATHY PAIN    Dispense:  90 tablet    Refill:  3  . clopidogrel (PLAVIX) 75 MG tablet    Sig: Take 1 tablet (75 mg total) by mouth daily.    Dispense:  30 tablet    Refill:  3  . carvedilol (COREG) 3.125 MG tablet    Sig: Take 1 tablet (3.125 mg total) by mouth 2 (two) times daily with a meal. Reported on 07/03/2015    Dispense:  60 tablet    Refill:  3  . atorvastatin (LIPITOR) 40 MG tablet    Sig: Take 1 tablet (40 mg total) by mouth daily. Reported on 07/13/2015    Dispense:  30 tablet    Refill:  3  . amLODipine (NORVASC) 10 MG tablet    Sig: Take 1 tablet (10 mg  total) by mouth daily. Reported on 07/13/2015    Dispense:  30 tablet    Refill:  3    Follow-up: Return in about 3 weeks (around 01/08/2016) for Follow-up of neck Lump and hypertension.   Arnoldo Morale MD

## 2015-12-18 NOTE — Progress Notes (Signed)
Out of BP med for one week

## 2015-12-19 ENCOUNTER — Telehealth: Payer: Self-pay

## 2015-12-19 ENCOUNTER — Other Ambulatory Visit: Payer: Self-pay

## 2015-12-19 DIAGNOSIS — E1149 Type 2 diabetes mellitus with other diabetic neurological complication: Secondary | ICD-10-CM

## 2015-12-19 LAB — HEPATITIS C ANTIBODY: HCV Ab: NEGATIVE

## 2015-12-19 LAB — HIV ANTIBODY (ROUTINE TESTING W REFLEX): HIV: NONREACTIVE

## 2015-12-19 MED ORDER — SITAGLIPTIN PHOSPHATE 50 MG PO TABS: 50.0000 mg | ORAL_TABLET | Freq: Every day | ORAL | 3 refills | Status: DC

## 2015-12-19 NOTE — Telephone Encounter (Signed)
-----   Message from Arnoldo Morale, MD sent at 12/19/2015  8:27 AM EST ----- HIV, hep C labs are negative

## 2015-12-19 NOTE — Telephone Encounter (Signed)
Writer called patient and LVM requesting patient to call back to discuss lab results.

## 2015-12-20 ENCOUNTER — Telehealth: Payer: Self-pay | Admitting: Family Medicine

## 2015-12-20 ENCOUNTER — Telehealth: Payer: Self-pay

## 2015-12-20 ENCOUNTER — Other Ambulatory Visit: Payer: Self-pay | Admitting: Family Medicine

## 2015-12-20 DIAGNOSIS — E1149 Type 2 diabetes mellitus with other diabetic neurological complication: Secondary | ICD-10-CM

## 2015-12-20 MED ORDER — SITAGLIPTIN PHOSPHATE 50 MG PO TABS: 50.0000 mg | ORAL_TABLET | Freq: Every day | ORAL | 3 refills | Status: DC

## 2015-12-20 NOTE — Telephone Encounter (Signed)
Writer called patient per Dr. Jarold Song to discuss her recent lab results.  Patient stated understanding.

## 2015-12-20 NOTE — Telephone Encounter (Signed)
Pt was returning nurse's call regarding lab results. Please follow up.  Thank you.

## 2015-12-20 NOTE — Telephone Encounter (Signed)
-----   Message from Arnoldo Morale, MD sent at 12/19/2015  8:27 AM EST ----- HIV, hep C labs are negative

## 2015-12-24 ENCOUNTER — Ambulatory Visit (INDEPENDENT_AMBULATORY_CARE_PROVIDER_SITE_OTHER): Payer: Medicaid Other | Admitting: Family

## 2015-12-24 DIAGNOSIS — Z89432 Acquired absence of left foot: Secondary | ICD-10-CM

## 2015-12-24 DIAGNOSIS — B351 Tinea unguium: Secondary | ICD-10-CM

## 2015-12-24 HISTORY — DX: Tinea unguium: B35.1

## 2015-12-24 NOTE — Progress Notes (Signed)
Office Visit Note   Patient: Rebekah Johnson           Date of Birth: 09-16-1960           MRN: [id number] Visit Date: 12/24/2015              Requested by: Arnoldo Morale, MD [place], [place] [id number] PCP: Arnoldo Morale, MD   Assessment & Plan: Visit Diagnoses:  1. Status post transmetatarsal amputation of foot, left ([place])   2. Onychomycosis     Plan: Right foot nails were trimmed today x 5 without incident. Continue with daily wound cleansing. Continue him applying Silvadene to the ulcers. Vive stockings bilaterally. Follow-up in 4 weeks.  Follow-Up Instructions: Return in about 4 weeks (around 01/21/2016).   Orders:  No orders of the defined types were placed in this encounter.  No orders of the defined types were placed in this encounter.     Procedures: No procedures performed   Clinical Data: No additional findings.   Subjective: Chief Complaint  Patient presents with  . Left Foot - Wound Check    She is a 55 year old woman who is seen today in follow-up for left transmetatarsal amputation on 09/06/2015 as well as for onychomycotic nails right foot. Has been wearing Vive stockings bilaterally. No drainage from wounds which are slowly filling in on the left foot.    Wound Check     Review of Systems  Constitutional: Negative for chills and fever.  Skin: Positive for wound.     Objective: Vital Signs: There were no vitals taken for this visit.  Physical Exam  Constitutional: She is oriented to person, place, and time. She appears well-developed and well-nourished.  Pulmonary/Chest: Effort normal.  Musculoskeletal:  Left transmetatarsal amputation is healing slowly. There is a superficial ulceration medially. This is 5 mm in diameter, filled in with exudative tissue. There is central ulceration that is 3 mm deep. This is 7 mm x 2 mm in size. Exudative tissue and callus were debrided. There is no surrounding erythema, no drainage, no  sign of infection.     Neurological: She is alert and oriented to person, place, and time.  Skin:  Thickened and discolored onychomycotic nails x 5, right foot. Is unable to safely trim own nails.   Psychiatric: She has a normal mood and affect.  Nursing note reviewed.   Ortho Exam  Specialty Comments:  No specialty comments available.  Imaging: No results found.   PMFS History: Patient Active Problem List   Diagnosis Date Noted  . Onychomycosis 12/24/2015  . Status post transmetatarsal amputation of foot, left ([place]) 09/06/2015  . Diabetic foot ulcer ([place]) 07/13/2015  . AKI (acute kidney injury) ([place]) 07/13/2015  . Cellulitis 07/12/2015  . HLD (hyperlipidemia) 09/02/2012  . Peripheral nerve disease ([place]) 09/02/2012  . Type 2 diabetes mellitus with peripheral angiopathy ([place]) 09/02/2012  . Hypertriglyceridemia 07/28/2012  . Peripheral vascular disease ([place]) 07/28/2012  . Avitaminosis D 07/28/2012  . Essential (primary) hypertension 06/10/2012  . History of biliary T-tube placement 06/10/2012  . Leg pain 06/10/2012  . Current tobacco use 06/10/2012  . Atrophic vaginitis 06/07/2012  . Type 2 diabetes mellitus ([place]) 06/07/2012   Past Medical History:  Diagnosis Date  . Diabetes mellitus without complication ([place])   . High cholesterol   . Hypertension     Family History  Problem Relation Age of Onset  . Diabetes Mother   . Hypertension Father     Past Surgical  History:  Procedure Laterality Date  . ABDOMINAL HYSTERECTOMY    . AMPUTATION Left 09/06/2015   Procedure: LEFT TRANSMETATARSAL AMPUTATTION;  Surgeon: Newt Minion, MD;  Location: [facility];  Service: Orthopedics;  Laterality: Left;  . PERIPHERAL VASCULAR CATHETERIZATION N/A 08/16/2015   Procedure: Abdominal Aortogram;  Surgeon: Elam Dutch, MD;  Location: [facility];  Service: Cardiovascular;  Laterality: N/A;  . PERIPHERAL VASCULAR CATHETERIZATION Bilateral 08/16/2015   Procedure: Lower Extremity  Angiography;  Surgeon: Elam Dutch, MD;  Location: [facility];  Service: Cardiovascular;  Laterality: Bilateral;  . PERIPHERAL VASCULAR CATHETERIZATION Left 08/16/2015   Procedure: Peripheral Vascular Intervention;  Surgeon: Elam Dutch, MD;  Location: [facility];  Service: Cardiovascular;  Laterality: Left;  SFA STENT X 2   Social History   Occupational History  . Not on file.   Social History Main Topics  . Smoking status: Current Every Day Smoker    Packs/day: 0.25    Types: Cigarettes  . Smokeless tobacco: Never Used     Comment: 3 daily  . Alcohol use 0.6 - 1.2 oz/week    1 - 2 Glasses of wine per week     Comment: on social occassions  . Drug use: No  . Sexual activity: Yes    Partners: Male

## 2015-12-26 ENCOUNTER — Other Ambulatory Visit: Payer: Self-pay | Admitting: *Deleted

## 2015-12-26 DIAGNOSIS — E1149 Type 2 diabetes mellitus with other diabetic neurological complication: Secondary | ICD-10-CM

## 2015-12-26 MED ORDER — SITAGLIPTIN PHOSPHATE 50 MG PO TABS: 50.0000 mg | ORAL_TABLET | Freq: Every day | ORAL | 3 refills | Status: DC

## 2015-12-26 NOTE — Telephone Encounter (Signed)
PRINTED FOR THE PASS PROGRAM

## 2016-01-06 DIAGNOSIS — J189 Pneumonia, unspecified organism: Secondary | ICD-10-CM

## 2016-01-06 HISTORY — DX: Pneumonia, unspecified organism: J18.9

## 2016-01-15 ENCOUNTER — Telehealth: Payer: Self-pay | Admitting: Family Medicine

## 2016-01-15 ENCOUNTER — Ambulatory Visit: Payer: Medicaid Other | Attending: Family Medicine | Admitting: Family Medicine

## 2016-01-15 ENCOUNTER — Encounter: Payer: Self-pay | Admitting: Family Medicine

## 2016-01-15 VITALS — BP 145/55 | HR 83 | Temp 97.9°F | Ht 64.0 in | Wt 190.0 lb

## 2016-01-15 DIAGNOSIS — I10 Essential (primary) hypertension: Secondary | ICD-10-CM | POA: Diagnosis not present

## 2016-01-15 DIAGNOSIS — Z7984 Long term (current) use of oral hypoglycemic drugs: Secondary | ICD-10-CM | POA: Insufficient documentation

## 2016-01-15 DIAGNOSIS — Z90710 Acquired absence of both cervix and uterus: Secondary | ICD-10-CM | POA: Insufficient documentation

## 2016-01-15 DIAGNOSIS — E7800 Pure hypercholesterolemia, unspecified: Secondary | ICD-10-CM | POA: Insufficient documentation

## 2016-01-15 DIAGNOSIS — Z720 Tobacco use: Secondary | ICD-10-CM

## 2016-01-15 DIAGNOSIS — Z955 Presence of coronary angioplasty implant and graft: Secondary | ICD-10-CM | POA: Insufficient documentation

## 2016-01-15 DIAGNOSIS — Z89422 Acquired absence of other left toe(s): Secondary | ICD-10-CM | POA: Diagnosis not present

## 2016-01-15 DIAGNOSIS — E1140 Type 2 diabetes mellitus with diabetic neuropathy, unspecified: Secondary | ICD-10-CM | POA: Diagnosis not present

## 2016-01-15 DIAGNOSIS — E1151 Type 2 diabetes mellitus with diabetic peripheral angiopathy without gangrene: Secondary | ICD-10-CM | POA: Insufficient documentation

## 2016-01-15 DIAGNOSIS — Z7982 Long term (current) use of aspirin: Secondary | ICD-10-CM | POA: Insufficient documentation

## 2016-01-15 DIAGNOSIS — F17210 Nicotine dependence, cigarettes, uncomplicated: Secondary | ICD-10-CM | POA: Diagnosis not present

## 2016-01-15 DIAGNOSIS — R221 Localized swelling, mass and lump, neck: Secondary | ICD-10-CM

## 2016-01-15 LAB — GLUCOSE, POCT (MANUAL RESULT ENTRY): POC GLUCOSE: 93 mg/dL (ref 70–99)

## 2016-01-15 MED ORDER — CARVEDILOL 6.25 MG PO TABS: 6.2500 mg | ORAL_TABLET | Freq: Two times a day (BID) | ORAL | 3 refills | Status: DC

## 2016-01-15 MED FILL — CARVEDILOL 6.25 MG TABLET: 6.25 | 30 days supply | Qty: 60 | Fill #0

## 2016-01-15 NOTE — Telephone Encounter (Signed)
[facility] called stating that they have been receiving faxes regarding the pt. And they state they have never seen the pt. Before. Rep. Stated to please not fax any more faxes to them regarding the pt.

## 2016-01-15 NOTE — Progress Notes (Signed)
Subjective:  Patient ID: Rebekah Johnson, female    DOB: 1960-03-04  Age: 57 y.o. MRN: [id number]  CC: Diabetes; Hypertension; and neck mass (right side)   HPI Rebekah Johnson is a 56 year old female with a history of type 2 diabetes mellitus (A1c 6.1), diabetic neuropathy, peripheral vascular disease (status post left SFA and popliteal stent and left SFA PTA with drug coated balloon in 08/2015),Left foot transmetatarsal amputation,  tobacco abuse, hypertension who presents today for follow-up of right neck mass which she had complained of at her  Last visit. She denies increase in size or pain.  Her blood pressure is mildly elevated but has improved  Significantly as she has been compliant with her meds.   She no longer places a dressing over her left foot; next appointment with her foot surgeon comes up in a few weeks. She continues to smoke one pack of cigarettes per day to one pack of cigarettes per week.  Past Medical History:  Diagnosis Date  . Diabetes mellitus without complication ([place])   . High cholesterol   . Hypertension     Past Surgical History:  Procedure Laterality Date  . ABDOMINAL HYSTERECTOMY    . AMPUTATION Left 09/06/2015   Procedure: LEFT TRANSMETATARSAL AMPUTATTION;  Surgeon: Newt Minion, MD;  Location: [facility];  Service: Orthopedics;  Laterality: Left;  . PERIPHERAL VASCULAR CATHETERIZATION N/A 08/16/2015   Procedure: Abdominal Aortogram;  Surgeon: Elam Dutch, MD;  Location: [facility];  Service: Cardiovascular;  Laterality: N/A;  . PERIPHERAL VASCULAR CATHETERIZATION Bilateral 08/16/2015   Procedure: Lower Extremity Angiography;  Surgeon: Elam Dutch, MD;  Location: [facility];  Service: Cardiovascular;  Laterality: Bilateral;  . PERIPHERAL VASCULAR CATHETERIZATION Left 08/16/2015   Procedure: Peripheral Vascular Intervention;  Surgeon: Elam Dutch, MD;  Location: [facility];  Service: Cardiovascular;  Laterality: Left;  SFA  STENT X 2    No Known Allergies   Outpatient Medications Prior to Visit  Medication Sig Dispense Refill  . amLODipine (NORVASC) 10 MG tablet Take 1 tablet (10 mg total) by mouth daily. Reported on 07/13/2015 30 tablet 3  . aspirin 81 MG EC tablet Take 1 tablet (81 mg total) by mouth daily. Reported on 07/13/2015 30 tablet 3  . atorvastatin (LIPITOR) 40 MG tablet Take 1 tablet (40 mg total) by mouth daily. Reported on 07/13/2015 30 tablet 3  . Cholecalciferol (VITAMIN D-3) 1000 units CAPS Take 1 capsule by mouth daily.    . clopidogrel (PLAVIX) 75 MG tablet Take 1 tablet (75 mg total) by mouth daily. 30 tablet 3  . doxycycline (VIBRA-TABS) 100 MG tablet Take 1 tablet (100 mg total) by mouth 2 (two) times daily. 60 tablet 0  . gabapentin (NEURONTIN) 600 MG tablet TAKE ONE TABLET BY MOUTH THREE TIMES DAILY AS NEEDED FOR NEUROPATHY PAIN 90 tablet 3  . glipiZIDE (GLUCOTROL) 10 MG tablet Take 1 tablet (10 mg total) by mouth 2 (two) times daily before a meal. 60 tablet 3  . isosorbide mononitrate (IMDUR) 60 MG 24 hr tablet Take 1 tablet (60 mg total) by mouth daily. 30 tablet 3  . lisinopril (PRINIVIL,ZESTRIL) 10 MG tablet Take 1 tablet (10 mg total) by mouth daily. 30 tablet 3  . nitroGLYCERIN (NITRODUR - DOSED IN MG/24 HR) 0.2 mg/hr patch Place 1 patch (0.2 mg total) onto the skin daily. 30 patch 3  . Omega-3 Fatty Acids (FISH OIL) 1000 MG CAPS Take 1 capsule by mouth daily. Reported  on 07/13/2015    . sitaGLIPtin (JANUVIA) 50 MG tablet Take 1 tablet (50 mg total) by mouth daily. 90 tablet 3  . carvedilol (COREG) 3.125 MG tablet Take 1 tablet (3.125 mg total) by mouth 2 (two) times daily with a meal. Reported on 07/03/2015 60 tablet 3  . blood glucose meter kit and supplies KIT Dispense based on patient and insurance preference. Use up to four times daily as directed. (FOR ICD-9 250.00, 250.01). (Patient not taking: Reported on 01/15/2016) 1 each 3  . gemfibrozil (LOPID) 600 MG tablet Take 1 tablet by mouth  2 (two) times daily. Reported on 07/13/2015    . glucose blood test strip   3  . silver sulfADIAZINE (SILVADENE) 1 % cream Apply 1 application topically daily. Apply to open wound plus dry dressing change daily (Patient not taking: Reported on 01/15/2016) 400 g 0  . TRUEPLUS LANCETS 28G MISC   3   No facility-administered medications prior to visit.     ROS Review of Systems Constitutional: Negative for activity change, appetite change and fatigue.  HENT: Negative for congestion, sinus pressure and sore throat.   Eyes: Negative for visual disturbance.  Respiratory: Negative for cough, chest tightness, shortness of breath and wheezing.   Cardiovascular: Negative for chest pain and palpitations.  Gastrointestinal: Negative for abdominal distention, abdominal pain and constipation.  Endocrine: Negative for polydipsia.  Genitourinary: Negative for dysuria and frequency.  Musculoskeletal: Negative for arthralgias and back pain.       See hpi  Skin: Negative for right-sided neck swelling  Neurological: Negative for tremors, light-headedness and numbness.  Hematological: Does not bruise/bleed easily.  Psychiatric/Behavioral: Negative for agitation and behavioral problems.  Objective:  BP (!) 145/55 (BP Location: Right Arm, Patient Position: Sitting, Cuff Size: Large)   Pulse 83   Temp 97.9 F (36.6 C) (Oral)   Ht 5' 4" (1.626 m)   Wt 190 lb (86.2 kg)   SpO2 99%   BMI 32.61 kg/m   BP/Weight 01/15/2016 12/18/2015 51/07/6158  Systolic BP 737 106 269  Diastolic BP 55 83 69  Wt. (Lbs) 190 164.4 170  BMI 32.61 28.22 29.18      Physical Exam Constitutional: She is oriented to person, place, and time. She appears well-developed and well-nourished.  Neck: Right-sided fluctuant lump which is  Not tender Cardiovascular: Normal rate and normal heart sounds.   No murmur heard. Absent dorsalis pedis bilaterally  Pulmonary/Chest: Effort normal and breath sounds normal. She has no wheezes. She  has no rales. She exhibits no tenderness.  Abdominal: Soft. Bowel sounds are normal. She exhibits no distension and no mass. There is no tenderness.  Musculoskeletal:  Left foot transmetatarsal amputation with minimal serous discharge from a small open portion .  Neurological: She is alert and oriented to person, place, and time.    CMP Latest Ref Rng & Units 12/18/2015 09/06/2015 08/16/2015  Glucose 65 - 99 mg/dL 194(H) 105(H) 158(H)  BUN 7 - 25 mg/dL _0 Creatinine 0.50 - 1.05 mg/dL 1.19(H) 1.06(H) 1.00  Sodium 135 - 146 mmol/L 142 140 142  Potassium 3.5 - 5.3 mmol/L 4.0 3.7 3.3(L)  Chloride 98 - 110 mmol/L 103 104 100(L)  CO2 20 - 31 mmol/L 30 28 -  Calcium 8.6 - 10.4 mg/dL 10.1 9.7 -  Total Protein 6.1 - 8.1 g/dL 7.2 - -  Total Bilirubin 0.2 - 1.2 mg/dL 0.3 - -  Alkaline Phos 33 - 130 U/L 101 - -  AST  10 - 35 U/L 13 - -  ALT 6 - 29 U/L 11 - -    Lab Results  Component Value Date   HGBA1C 6.1 10/23/2015    Assessment & Plan:   1. Type 2 diabetes mellitus with peripheral angiopathy (HCC) Controlled with A1c of 6.1 Continue medications - Glucose (CBG)  2. Essential (primary) hypertension Uncontrolled but improved from previous blood pressures Low-sodium diet Increased dose of Coreg - carvedilol (COREG) 6.25 MG tablet; Take 1 tablet (6.25 mg total) by mouth 2 (two) times daily with a meal. Reported on 07/03/2015  Dispense: 60 tablet; Refill: 3  3. Neck mass Suspicious for lipoma - US Soft Tissue Head/Neck; Future  4.Tobacco abuse Spent 3 minutes counseling on cessation and she is not willing to quit at this time.  Meds ordered this encounter  Medications  . carvedilol (COREG) 6.25 MG tablet    Sig: Take 1 tablet (6.25 mg total) by mouth 2 (two) times daily with a meal. Reported on 07/03/2015    Dispense:  60 tablet    Refill:  3    Discontinue her previous dose    Follow-up: Return in about 6 weeks (around 02/26/2016) for follow-up on chronic medical condition.    Arnoldo Morale MD

## 2016-01-15 NOTE — Telephone Encounter (Signed)
I usually do not send faxes. You might want to find out which of the other staff does. Thanks.

## 2016-01-21 ENCOUNTER — Ambulatory Visit (HOSPITAL_COMMUNITY)
Admission: RE | Admit: 2016-01-21 | Discharge: 2016-01-21 | Disposition: A | Discharge status: Home / Self Care | Payer: Medicaid Other | Source: Ambulatory Visit | Attending: Family Medicine | Admitting: Family Medicine

## 2016-01-21 DIAGNOSIS — R221 Localized swelling, mass and lump, neck: Secondary | ICD-10-CM

## 2016-01-21 DIAGNOSIS — E079 Disorder of thyroid, unspecified: Secondary | ICD-10-CM | POA: Diagnosis not present

## 2016-01-22 ENCOUNTER — Ambulatory Visit (INDEPENDENT_AMBULATORY_CARE_PROVIDER_SITE_OTHER): Payer: Self-pay | Admitting: Family

## 2016-01-24 ENCOUNTER — Telehealth: Payer: Self-pay | Admitting: Family Medicine

## 2016-01-24 ENCOUNTER — Telehealth: Payer: Self-pay

## 2016-01-24 NOTE — Telephone Encounter (Signed)
Writer spoke with patient about [place] of her thyroid gland.  Per Dr. Jarold Song thyroid gland is stable.  Patient stated understanding.

## 2016-01-24 NOTE — Telephone Encounter (Signed)
-----   Message from Arnoldo Morale, MD sent at 01/24/2016 10:39 AM EST ----- Ultrasound revealed a stable enlarged thyroid gland with some nodules. Recommendation is for a one-year follow-up imaging.

## 2016-01-24 NOTE — Telephone Encounter (Signed)
Writer called patient per MD to discuss [place].  LVM requesting patient to call back to discuss.

## 2016-01-24 NOTE — Telephone Encounter (Signed)
Pt returning call from nurse in regards to lab results Pt was transferred to nurse's voicemail

## 2016-01-27 ENCOUNTER — Other Ambulatory Visit (INDEPENDENT_AMBULATORY_CARE_PROVIDER_SITE_OTHER): Payer: Self-pay | Admitting: Orthopedic Surgery

## 2016-01-27 DIAGNOSIS — L97521 Non-pressure chronic ulcer of other part of left foot limited to breakdown of skin: Secondary | ICD-10-CM

## 2016-01-27 MED FILL — GABAPENTIN 600 MG TABLET: 600 | 30 days supply | Qty: 90 | Fill #0

## 2016-01-27 MED FILL — ISOSORBIDE MN ER 60 MG TAB: 60 | 30 days supply | Qty: 30 | Fill #0

## 2016-01-27 MED FILL — AMLODIPINE BESYLATE 10 MG T: 10 | 30 days supply | Qty: 30 | Fill #0

## 2016-01-27 MED FILL — CLOPIDOGREL 75 MG TABLET: 75 | 30 days supply | Qty: 30 | Fill #0

## 2016-01-27 MED FILL — ATORVASTATIN 40 MG TABLET: 40 | 30 days supply | Qty: 30 | Fill #1

## 2016-01-27 MED FILL — glipiZIDE 10 MG TABS: 10 | 30 days supply | Qty: 60 | Fill #0

## 2016-01-27 MED FILL — LISINOPRIL 10 MG TABLET: 10 | 30 days supply | Qty: 30 | Fill #0

## 2016-01-28 MED FILL — DOXYCYCLINE 100 MG TABLET: 100 | 30 days supply | Qty: 60 | Fill #0

## 2016-01-30 ENCOUNTER — Encounter (HOSPITAL_COMMUNITY): Payer: PRIVATE HEALTH INSURANCE

## 2016-01-30 ENCOUNTER — Ambulatory Visit (INDEPENDENT_AMBULATORY_CARE_PROVIDER_SITE_OTHER): Payer: Medicaid Other | Admitting: Family

## 2016-01-30 ENCOUNTER — Ambulatory Visit: Payer: PRIVATE HEALTH INSURANCE | Admitting: Vascular Surgery

## 2016-01-30 VITALS — Ht 64.0 in | Wt 190.0 lb

## 2016-01-30 DIAGNOSIS — Z89432 Acquired absence of left foot: Secondary | ICD-10-CM

## 2016-01-30 DIAGNOSIS — B351 Tinea unguium: Secondary | ICD-10-CM | POA: Diagnosis not present

## 2016-01-30 NOTE — Progress Notes (Signed)
Office Visit Note   Patient: Rebekah Johnson           Date of Birth: 1960-07-04           MRN: [id number] Visit Date: 01/30/2016              Requested by: Arnoldo Morale, MD [place], [place] [address] PCP: Arnoldo Morale, MD  Chief Complaint  Patient presents with  . Left Foot - Follow-up    09/06/15 left foot transmet amputation.     HPI: Patient is 4 months s/p a left transmet amputation. She is wearing Vive socks bilaterally and full weight bearing in regular shoes. She feels well and has no questions or concerns. Autumn L Forrest, RMA  She is a 56 year old woman who is seen today in follow-up for left transmetatarsal amputation. This has healed well there is some callus medially and laterally. She has been using hte vive compression stockings bilaterally. Full weightbearing in regular shoewear. No concerns voiced today. Does state she is still taking her doxycycline and using the nitroglycerin patches on the left foot.    Assessment & Plan: Visit Diagnoses:  1. Status post transmetatarsal amputation of foot, left ([place])   2. Onychomycosis     Plan: Nails were trimmed 5 today. She'll discontinue the doxycycline and nitroglycerin patches. Continue activities as tolerated. Follow-up in office in 3 more months for foot eval and nail trim.    Follow-Up Instructions: Return in about 3 months (around 04/29/2016).   Ortho Exam Physical Exam  Constitutional: Appears well-developed.  Head: Normocephalic.  Eyes: EOM are normal.  Neck: Normal range of motion.  Cardiovascular: Normal rate.   Pulmonary/Chest: Effort normal.  Neurological: Is alert.  Skin: Skin is warm.  Psychiatric: Has a normal mood and affect. Left foot: transmetatarsal amputation is well healed. There is some thickened callus medially and laterally. This was debrided with gauze. Underlying epithelialization. No ulcers, drainage or erythema. Right foot. Does have thickened and discolored nails x 5.  Unable to safely trim own nails. No open ulcers, drainage, erythema or sign of infection to the right foot.   Imaging: No results found.  Orders:  No orders of the defined types were placed in this encounter.  No orders of the defined types were placed in this encounter.    Procedures: No procedures performed  Clinical Data: No additional findings.  Subjective: Review of Systems  Constitutional: Negative for chills and fever.  Skin: Negative for wound.    Objective: Vital Signs: Ht 5\' 4"  (1.626 m)   Wt 190 lb (86.2 kg)   BMI 32.61 kg/m   Specialty Comments:  No specialty comments available.  PMFS History: Patient Active Problem List   Diagnosis Date Noted  . Onychomycosis 12/24/2015  . Status post transmetatarsal amputation of foot, left ([place]) 09/06/2015  . Diabetic foot ulcer ([place]) 07/13/2015  . AKI (acute kidney injury) ([place]) 07/13/2015  . Cellulitis 07/12/2015  . HLD (hyperlipidemia) 09/02/2012  . Peripheral nerve disease ([place]) 09/02/2012  . Type 2 diabetes mellitus with peripheral angiopathy ([place]) 09/02/2012  . Hypertriglyceridemia 07/28/2012  . Peripheral vascular disease ([place]) 07/28/2012  . Avitaminosis D 07/28/2012  . Essential (primary) hypertension 06/10/2012  . History of biliary T-tube placement 06/10/2012  . Leg pain 06/10/2012  . Current tobacco use 06/10/2012  . Atrophic vaginitis 06/07/2012  . Type 2 diabetes mellitus ([place]) 06/07/2012   Past Medical History:  Diagnosis Date  . Diabetes mellitus without complication ([place])   .  High cholesterol   . Hypertension     Family History  Problem Relation Age of Onset  . Diabetes Mother   . Hypertension Father     Past Surgical History:  Procedure Laterality Date  . ABDOMINAL HYSTERECTOMY    . AMPUTATION Left 09/06/2015   Procedure: LEFT TRANSMETATARSAL AMPUTATTION;  Surgeon: Newt Minion, MD;  Location: [facility];  Service: Orthopedics;  Laterality: Left;  . PERIPHERAL VASCULAR CATHETERIZATION N/A  08/16/2015   Procedure: Abdominal Aortogram;  Surgeon: Elam Dutch, MD;  Location: [facility];  Service: Cardiovascular;  Laterality: N/A;  . PERIPHERAL VASCULAR CATHETERIZATION Bilateral 08/16/2015   Procedure: Lower Extremity Angiography;  Surgeon: Elam Dutch, MD;  Location: [facility];  Service: Cardiovascular;  Laterality: Bilateral;  . PERIPHERAL VASCULAR CATHETERIZATION Left 08/16/2015   Procedure: Peripheral Vascular Intervention;  Surgeon: Elam Dutch, MD;  Location: [facility];  Service: Cardiovascular;  Laterality: Left;  SFA STENT X 2   Social History   Occupational History  . Not on file.   Social History Main Topics  . Smoking status: Current Every Day Smoker    Packs/day: 0.25    Types: Cigarettes  . Smokeless tobacco: Never Used     Comment: 3 daily  . Alcohol use 0.6 - 1.2 oz/week    1 - 2 Glasses of wine per week     Comment: on social occassions  . Drug use: No  . Sexual activity: Yes    Partners: Male

## 2016-02-05 ENCOUNTER — Encounter: Payer: Self-pay | Admitting: Vascular Surgery

## 2016-02-12 ENCOUNTER — Encounter (HOSPITAL_COMMUNITY): Payer: PRIVATE HEALTH INSURANCE

## 2016-02-12 ENCOUNTER — Ambulatory Visit: Payer: PRIVATE HEALTH INSURANCE | Admitting: Vascular Surgery

## 2016-02-25 ENCOUNTER — Ambulatory Visit: Payer: Medicaid Other | Attending: Family Medicine | Admitting: Family Medicine

## 2016-02-25 ENCOUNTER — Encounter: Payer: Self-pay | Admitting: Family Medicine

## 2016-02-25 VITALS — BP 154/68 | HR 64 | Temp 98.0°F | Ht 64.0 in | Wt 187.0 lb

## 2016-02-25 DIAGNOSIS — E1151 Type 2 diabetes mellitus with diabetic peripheral angiopathy without gangrene: Secondary | ICD-10-CM | POA: Insufficient documentation

## 2016-02-25 DIAGNOSIS — E1140 Type 2 diabetes mellitus with diabetic neuropathy, unspecified: Secondary | ICD-10-CM | POA: Diagnosis not present

## 2016-02-25 DIAGNOSIS — E7800 Pure hypercholesterolemia, unspecified: Secondary | ICD-10-CM | POA: Diagnosis not present

## 2016-02-25 DIAGNOSIS — Z720 Tobacco use: Secondary | ICD-10-CM | POA: Diagnosis not present

## 2016-02-25 DIAGNOSIS — Z95828 Presence of other vascular implants and grafts: Secondary | ICD-10-CM | POA: Insufficient documentation

## 2016-02-25 DIAGNOSIS — E1100 Type 2 diabetes mellitus with hyperosmolarity without nonketotic hyperglycemic-hyperosmolar coma (NKHHC): Secondary | ICD-10-CM | POA: Diagnosis not present

## 2016-02-25 DIAGNOSIS — E042 Nontoxic multinodular goiter: Secondary | ICD-10-CM | POA: Diagnosis not present

## 2016-02-25 DIAGNOSIS — E1149 Type 2 diabetes mellitus with other diabetic neurological complication: Secondary | ICD-10-CM

## 2016-02-25 DIAGNOSIS — I10 Essential (primary) hypertension: Secondary | ICD-10-CM | POA: Insufficient documentation

## 2016-02-25 LAB — GLUCOSE, POCT (MANUAL RESULT ENTRY): POC Glucose: 154 mg/dl — AB (ref 70–99)

## 2016-02-25 LAB — POCT GLYCOSYLATED HEMOGLOBIN (HGB A1C): Hemoglobin A1C: 7.4

## 2016-02-25 MED ORDER — SITAGLIPTIN PHOSPHATE 50 MG PO TABS: 50.0000 mg | ORAL_TABLET | Freq: Every day | ORAL | 1 refills | Status: DC

## 2016-02-25 NOTE — Progress Notes (Signed)
Subjective:    Patient ID: Rebekah Johnson, female    DOB: 09/15/1960, 56 y.o.   MRN: [id number]  HPI Rebekah Johnson is a 56 year old female with a history of type 2 diabetes mellitus (A1c 6.1), diabetic neuropathy, peripheral vascular disease (status post left SFA and popliteal stent and left SFA PTA with drug coated balloon in 08/2015),Left foot transmetatarsal amputation,  tobacco abuse, hypertension who presents today for follow-up visit.   She had complained of right neck mass and thyroid ultrasound revealed stable cardiomegaly with bilateral nodules, recommend one-year follow-up ultrasound of 2.4 cm inferior right nodule. Informs me she has had a biopsy of the nodule in the past - benign as per patient. She denies increase in size or pain.  Her blood pressure is mildly elevated but has improved  Significantly as she has been compliant with her meds.   Past Medical History:  Diagnosis Date  . Diabetes mellitus without complication ([place])   . High cholesterol   . Hypertension     Past Surgical History:  Procedure Laterality Date  . ABDOMINAL HYSTERECTOMY    . AMPUTATION Left 09/06/2015   Procedure: LEFT TRANSMETATARSAL AMPUTATTION;  Surgeon: Newt Minion, MD;  Location: [facility];  Service: Orthopedics;  Laterality: Left;  . PERIPHERAL VASCULAR CATHETERIZATION N/A 08/16/2015   Procedure: Abdominal Aortogram;  Surgeon: Elam Dutch, MD;  Location: [facility];  Service: Cardiovascular;  Laterality: N/A;  . PERIPHERAL VASCULAR CATHETERIZATION Bilateral 08/16/2015   Procedure: Lower Extremity Angiography;  Surgeon: Elam Dutch, MD;  Location: [facility];  Service: Cardiovascular;  Laterality: Bilateral;  . PERIPHERAL VASCULAR CATHETERIZATION Left 08/16/2015   Procedure: Peripheral Vascular Intervention;  Surgeon: Elam Dutch, MD;  Location: [facility];  Service: Cardiovascular;  Laterality: Left;  SFA STENT X 2    No Known Allergies   Review of  Systems Constitutional: Negative for activity change, appetite change and fatigue.  HENT: Negative for congestion, sinus pressure and sore throat.   Eyes: Negative for visual disturbance.  Respiratory: Negative for cough, chest tightness, shortness of breath and wheezing.   Cardiovascular: Negative for chest pain and palpitations.  Gastrointestinal: Negative for abdominal distention, abdominal pain and constipation.  Endocrine: Negative for polydipsia.  Genitourinary: Negative for dysuria and frequency.  Musculoskeletal: Negative for arthralgias and back pain.       See hpi  Skin: Negative for right-sided neck swelling  Neurological: Negative for tremors, light-headedness and numbness.  Hematological: Does not bruise/bleed easily.  Psychiatric/Behavioral: Negative for agitation and behavioral problems    Objective: Vitals:   02/25/16 1431  BP: (!) 154/68  Pulse: 64  Temp: 98 F (36.7 C)  TempSrc: Oral  SpO2: 99%  Weight: 187 lb (84.8 kg)  Height: 5\' 4"  (1.626 m)      Physical Exam Constitutional: She is oriented to person, place, and time. She appears well-developed and well-nourished.  Neck: Right-sided fluctuant lump which is  Not tender Cardiovascular: Normal rate and normal heart sounds.   No murmur heard. Absent dorsalis pedis on the left; present on the right Pulmonary/Chest: Effort normal and breath sounds normal. She has no wheezes. She has no rales. She exhibits no tenderness.  Abdominal: Soft. Bowel sounds are normal. She exhibits no distension and no mass. There is no tenderness.  Musculoskeletal: Left foot transmetatarsal amputation.   Neurological: She is alert and oriented to person, place, and time.         Lab Results  Component Value  Date   HGBA1C 7.4 02/25/2016    CMP Latest Ref Rng & Units 12/18/2015 09/06/2015 08/16/2015  Glucose 65 - 99 mg/dL 194(H) 105(H) 158(H)  BUN 7 - 25 mg/dL 10 8 12   Creatinine 0.50 - 1.05 mg/dL 1.19(H) 1.06(H) 1.00  Sodium  135 - 146 mmol/L 142 140 142  Potassium 3.5 - 5.3 mmol/L 4.0 3.7 3.3(L)  Chloride 98 - 110 mmol/L 103 104 100(L)  CO2 20 - 31 mmol/L 30 28 -  Calcium 8.6 - 10.4 mg/dL 10.1 9.7 -  Total Protein 6.1 - 8.1 g/dL 7.2 - -  Total Bilirubin 0.2 - 1.2 mg/dL 0.3 - -  Alkaline Phos 33 - 130 U/L 101 - -  AST 10 - 35 U/L 13 - -  ALT 6 - 29 U/L 11 - -    CLINICAL DATA:  Thyroid mass . Previous FNA biopsy of dominant right lesion 08/10/2012.  EXAM: THYROID ULTRASOUND  TECHNIQUE: Ultrasound examination of the thyroid gland and adjacent soft tissues was performed.  COMPARISON:  08/10/2012 and previous  FINDINGS: Parenchymal Echotexture: Mildly heterogenous  Isthmus: 0.8 cm thickness, previously 0.5  Right lobe: 5.7 x 2.8 x 3.3 cm (previously 6 x 2.8 x 3.4)  Left lobe: 5.6 x 2.2 x 1.4 cm (previously 5.1 x 1.5 x 1.7)  _________________________________________________________  Estimated total number of nodules >/= 1 cm: 3  Number of spongiform nodules >/=  2 cm not described below (TR1): 0  Number of mixed cystic and solid nodules >/= 1.5 cm not described below ([place]): 0  Nodule # 1:  Location: Right; Inferior  Maximum size: 2.4 cm; Other 2 dimensions: 1.9 x 2.3 cm cm  Composition: solid/almost completely solid (2)  Echogenicity: cannot determine (1)  Shape: not taller-than-wide (0)  Margins: ill-defined (0)  Echogenic foci: none (0)  ACR TI-RADS total points: 3.  ACR TI-RADS risk category: TR3 (3 points).  ACR TI-RADS recommendations:  *Given size (>/= 1.5 - 2.4 cm) and appearance, a follow-up ultrasound in 1 year should be considered based on TI-RADS criteria.  Nodule # 2:  Location: Left; Mid  Maximum size: 1.4 cm; Other 2 dimensions: 0.7 x 1 cm  Composition: solid/almost completely solid (2)  Echogenicity: isoechoic (1)  Shape: not taller-than-wide (0)  Margins: ill-defined (0)  Echogenic foci: none (0)  ACR TI-RADS  total points: 3.  ACR TI-RADS risk category: TR3 (3 points).  ACR TI-RADS recommendations:  Given size (<1.4 cm) and appearance, this nodule does NOT meet TI-RADS criteria for biopsy or dedicated follow-up.  4.9 x 2.9 x 3.4 cm mid right nodule, previously biopsied (previously 4.1 x 2.2 x 2.9)  0.8 cm complex solid/cystic nodule, superior pole left.  0.4 cm hypoechoic nodule, inferior left.  IMPRESSION: 1. Stable thyromegaly with bilateral nodules. 2. Recommend 1 year follow-up ultrasound of 2.4 cm inferior right nodule.  The above is in keeping with the ACR TI-RADS recommendations - J Am Coll Radiol 2017;14:587-595.   Electronically Signed   By: Lucrezia Europe M.D.   On: 01/21/2016 16:35  Assessment & Plan:  1. Type 2 diabetes mellitus with peripheral angiopathy (HCC) A1c 7.4 which has trended up from 6.1 Likely due to her running out of Januvia which I have refilled. Continue medications - Glucose (CBG)  2. Essential (primary) hypertension Uncontrolled but improved from previous blood pressures Low-sodium diet Patient resists increase in dose of antihypertensive today Would love to try lifestyle modifications prior to adjusting regimen.  3. Thyroid nodules Stable thyromegaly with bilateral  nodules One-year follow-up ultrasound  4.Tobacco abuse Spent 3 minutes counseling on cessation and she is not willing to quit at this time.  Annual Physical at next visit.

## 2016-03-09 ENCOUNTER — Telehealth (INDEPENDENT_AMBULATORY_CARE_PROVIDER_SITE_OTHER): Payer: Self-pay | Admitting: Orthopedic Surgery

## 2016-03-09 MED FILL — ISOSORBIDE MN ER 60 MG TAB: 60 | 30 days supply | Qty: 30 | Fill #1

## 2016-03-09 MED FILL — LISINOPRIL 10 MG TABLET: 10 | 30 days supply | Qty: 30 | Fill #1

## 2016-03-09 MED FILL — AMLODIPINE BESYLATE 10 MG T: 10 | 30 days supply | Qty: 30 | Fill #1

## 2016-03-09 MED FILL — CARVEDILOL 6.25 MG TABLET: 6.25 | 30 days supply | Qty: 60 | Fill #1

## 2016-03-09 MED FILL — ATORVASTATIN 40 MG TABLET: 40 | 30 days supply | Qty: 30 | Fill #1

## 2016-03-09 MED FILL — CLOPIDOGREL 75 MG TABLET: 75 | 30 days supply | Qty: 30 | Fill #1

## 2016-03-09 MED FILL — glipiZIDE 10 MG TABS: 10 | 30 days supply | Qty: 60 | Fill #1

## 2016-03-09 NOTE — Telephone Encounter (Signed)
I called and left voicemail we do not have this letter she speaks of. But did have Rebekah Johnson to pull [place] tracks to pull her medicaid eligibility in October.

## 2016-03-09 NOTE — Telephone Encounter (Signed)
Patient called asked if she can get a copy of the letter she gave to Dr Sharol Given stating when her medicaid went into effect. Patient said her medicaid started 10/06/2015. Patient  Advised could not find her copy of the letter. Patient said she will pick up the letter when it is ready. The number to contact patient is [phone]

## 2016-03-18 MED FILL — GABAPENTIN 600 MG TABLET: 600 | 30 days supply | Qty: 90 | Fill #1

## 2016-03-20 MED FILL — JANUVIA 50 MG TABLET: 50 | 30 days supply | Qty: 30 | Fill #0

## 2016-03-26 ENCOUNTER — Encounter: Payer: Medicaid Other | Admitting: Family Medicine

## 2016-04-07 ENCOUNTER — Encounter: Payer: Self-pay | Admitting: Vascular Surgery

## 2016-04-08 ENCOUNTER — Telehealth: Payer: Self-pay

## 2016-04-08 NOTE — Telephone Encounter (Signed)
Lorriane Shire called from [facility] looking for the medical clearance paperwork so patient can be scheduled for oral surgery.  Writer has never seen the form and am asking for another form to be faxed to the [facility].  Form being faxed.

## 2016-04-10 MED FILL — ISOSORBIDE MN ER 60 MG TAB: 60 | 30 days supply | Qty: 30 | Fill #2

## 2016-04-10 MED FILL — ATORVASTATIN 40 MG TABLET: 40 | 30 days supply | Qty: 30 | Fill #2

## 2016-04-10 MED FILL — TRUE METRIX TEST STRIP: 25 days supply | Qty: 100 | Fill #1

## 2016-04-10 MED FILL — glipiZIDE 10 MG TABS: 10 | 30 days supply | Qty: 60 | Fill #2

## 2016-04-10 MED FILL — CLOPIDOGREL 75 MG TABLET: 75 | 30 days supply | Qty: 30 | Fill #2

## 2016-04-10 MED FILL — AMLODIPINE BESYLATE 10 MG T: 10 | 30 days supply | Qty: 30 | Fill #2

## 2016-04-10 MED FILL — CARVEDILOL 6.25 MG TABLET: 6.25 | 30 days supply | Qty: 60 | Fill #2

## 2016-04-10 MED FILL — LISINOPRIL 10 MG TABLET: 10 | 30 days supply | Qty: 30 | Fill #2

## 2016-04-14 ENCOUNTER — Telehealth: Payer: Self-pay | Admitting: Family Medicine

## 2016-04-14 NOTE — Telephone Encounter (Signed)
Patient called the office asking to speak with nurse to inform her that [employer] [phone]) has been faxing documents in order to get medical clearance for patient to get all her upper teeth extracted. They haven't received the documents. Please follow up.  Thank you.

## 2016-04-15 ENCOUNTER — Encounter: Payer: Self-pay | Admitting: Vascular Surgery

## 2016-04-15 ENCOUNTER — Encounter (HOSPITAL_COMMUNITY): Payer: Medicaid Other

## 2016-04-15 ENCOUNTER — Encounter (HOSPITAL_COMMUNITY): Payer: Self-pay

## 2016-04-15 ENCOUNTER — Ambulatory Visit (INDEPENDENT_AMBULATORY_CARE_PROVIDER_SITE_OTHER): Payer: Medicaid Other | Admitting: Vascular Surgery

## 2016-04-15 VITALS — BP 212/95 | HR 80 | Resp 18 | Ht 64.0 in | Wt 197.1 lb

## 2016-04-15 DIAGNOSIS — I70262 Atherosclerosis of native arteries of extremities with gangrene, left leg: Secondary | ICD-10-CM | POA: Diagnosis not present

## 2016-04-15 NOTE — Progress Notes (Signed)
Patient name: Rebekah Johnson MRN: [id number] DOB: 1960-11-14 Sex: female  REASON FOR VISIT: Follow up.  HPI:  Rebekah Johnson is a 56 y.o. female who I last saw on 08/28/2015 with a left foot wound. 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 She underwent an arteriogram on 08/16/2015 and had occlusion of the distal superficial femoral artery and popliteal artery. This was successfully ballooned and stented with no residual stenosis. She also had angioplasty of the proximal left superficial femoral artery with a drug coated balloon. Dr. Sharol Given  was following her left foot wound. I recommended a follow up duplex scan and ABIs of the stented artery but this was denied by Medicare. She comes in for an office visit.  She denies any specific complaints. 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 She denies significant claudication. She denies rest pain or any new nonhealing ulcers.  Past Medical History:  Diagnosis Date  . Diabetes mellitus without complication ([place])   . High cholesterol   . Hypertension     Family History  Problem Relation Age of Onset  . Diabetes Mother   . Hypertension Father     SOCIAL HISTORY: Social History  Substance Use Topics  . Smoking status: Current Every Day Smoker    Packs/day: 0.25    Types: Cigarettes  . Smokeless tobacco: Never Used     Comment: 3 daily  . Alcohol use 0.6 - 1.2 oz/week    1 - 2 Glasses of wine per week     Comment: on social occassions    No Known Allergies  Current Outpatient Prescriptions  Medication Sig Dispense Refill  . amLODipine (NORVASC) 10 MG tablet Take 1 tablet (10 mg total) by mouth daily. Reported on 07/13/2015 30 tablet 3  . aspirin 81 MG EC tablet Take 1 tablet (81 mg total) by mouth daily. Reported on 07/13/2015 30 tablet 3  . atorvastatin (LIPITOR) 40 MG tablet Take 1 tablet (40 mg total) by mouth daily. Reported on 07/13/2015 30 tablet 3  . blood  glucose meter kit and supplies KIT Dispense based on patient and insurance preference. Use up to four times daily as directed. (FOR ICD-9 250.00, 250.01). 1 each 3  . carvedilol (COREG) 6.25 MG tablet Take 1 tablet (6.25 mg total) by mouth 2 (two) times daily with a meal. Reported on 07/03/2015 60 tablet 3  . Cholecalciferol (VITAMIN D-3) 1000 units CAPS Take 1 capsule by mouth daily.    . clopidogrel (PLAVIX) 75 MG tablet Take 1 tablet (75 mg total) by mouth daily. 30 tablet 3  . gabapentin (NEURONTIN) 600 MG tablet TAKE ONE TABLET BY MOUTH THREE TIMES DAILY AS NEEDED FOR NEUROPATHY PAIN 90 tablet 3  . glipiZIDE (GLUCOTROL) 10 MG tablet Take 1 tablet (10 mg total) by mouth 2 (two) times daily before a meal. 60 tablet 3  . glucose blood test strip   3  . isosorbide mononitrate (IMDUR) 60 MG 24 hr tablet Take 1 tablet (60 mg total) by mouth daily. 30 tablet 3  . lisinopril (PRINIVIL,ZESTRIL) 10 MG tablet Take 1 tablet (10 mg total) by mouth daily. 30 tablet 3  . Omega-3 Fatty Acids (FISH OIL) 1000 MG CAPS Take 1 capsule by mouth daily. Reported on 07/13/2015    . sitaGLIPtin (JANUVIA) 50 MG tablet Take 1 tablet (50 mg total) by mouth daily. 90 tablet 1  . TRUEPLUS LANCETS 28G MISC   3  . gemfibrozil (LOPID)  600 MG tablet Take 1 tablet by mouth 2 (two) times daily. Reported on 07/13/2015     No current facility-administered medications for this visit.     REVIEW OF SYSTEMS:  '[X]'  denotes positive finding, '[ ]'  denotes negative finding Cardiac  Comments:  Chest pain or chest pressure:    Shortness of breath upon exertion:    Short of breath when lying flat:    Irregular heart rhythm:        Vascular    Pain in calf, thigh, or hip brought on by ambulation:    Pain in feet at night that wakes you up from your sleep:     Blood clot in your veins:    Leg swelling:         Pulmonary    Oxygen at home:    Productive cough:     Wheezing:         Neurologic    Sudden weakness in arms or legs:       Sudden numbness in arms or legs:     Sudden onset of difficulty speaking or slurred speech:    Temporary loss of vision in one eye:     Problems with dizziness:         Gastrointestinal    Blood in stool:     Vomited blood:         Genitourinary    Burning when urinating:     Blood in urine:        Psychiatric    Major depression:         Hematologic    Bleeding problems:    Problems with blood clotting too easily:        Skin    Rashes or ulcers:        Constitutional    Fever or chills:      PHYSICAL EXAM: Vitals:   04/15/16 1311 04/15/16 1312  BP: (!) 219/94 (!) 212/95  Pulse: 80   Resp: 18   SpO2: 99%   Weight: 197 lb 1.6 oz (89.4 kg)   Height: '5\' 4"'  (1.626 m)     GENERAL: The patient is a well-nourished female, in no acute distress. The vital signs are documented above. CARDIAC: There is a regular rate and rhythm.  VASCULAR: She has palpable femoral pulses. I cannot palpate pedal pulses. She has brisk posterior tibial signals bilaterally. I cannot obtain dorsalis pedis or peroneal signals. PULMONARY: There is good air exchange bilaterally without wheezing or rales. ABDOMEN: Soft and non-tender with normal pitched bowel sounds.  MUSCULOSKELETAL: She has a transmetatarsal amputation on the left. NEUROLOGIC: No focal weakness or paresthesias are detected. SKIN: There are no ulcers or rashes noted. PSYCHIATRIC: The patient has a normal affect.  DATA:   Medicaid denied her ABIs and duplex of her left lower extremity.  MEDICAL ISSUES:  STATUS POST ANGIOPLASTY AND STENTING OF THE LEFT LOWER EXTREMITY: The patient is doing well status post angioplasty and stenting of the left superficial femoral artery and popliteal artery. Her transmetatarsal amputation site is completely healed. I have encouraged her to stay as active as possible. I think we do need to obtain ABIs and duplex to follow her stented area closely. I then see her back in 6 months with follow up  studies also. She knows to continue her Plavix.  Deitra Mayo [facility] [phone]

## 2016-04-16 NOTE — Addendum Note (Signed)
Addended by: Lianne Cure A on: 04/16/2016 09:33 AM   Modules accepted: Orders

## 2016-04-16 NOTE — Telephone Encounter (Signed)
I received the paperwork and she was scheduled for an appointment on 3/22 which she commenced. She needs an office visit to evaluate her uncontrolled blood pressure prior to clearance.

## 2016-04-17 NOTE — Telephone Encounter (Signed)
Patient has a scheduled appt for 04/22/16 for a BP check and to fill out dental paperwork.

## 2016-04-22 ENCOUNTER — Ambulatory Visit: Payer: Medicaid Other | Attending: Family Medicine | Admitting: Family Medicine

## 2016-04-22 ENCOUNTER — Other Ambulatory Visit: Payer: Self-pay | Admitting: Family Medicine

## 2016-04-22 ENCOUNTER — Encounter: Payer: Self-pay | Admitting: Family Medicine

## 2016-04-22 VITALS — BP 165/60 | HR 72 | Temp 98.0°F | Ht 64.0 in | Wt 204.2 lb

## 2016-04-22 DIAGNOSIS — E1100 Type 2 diabetes mellitus with hyperosmolarity without nonketotic hyperglycemic-hyperosmolar coma (NKHHC): Secondary | ICD-10-CM

## 2016-04-22 DIAGNOSIS — Z7984 Long term (current) use of oral hypoglycemic drugs: Secondary | ICD-10-CM | POA: Insufficient documentation

## 2016-04-22 DIAGNOSIS — T2103XA Burn of unspecified degree of upper back, initial encounter: Secondary | ICD-10-CM | POA: Insufficient documentation

## 2016-04-22 DIAGNOSIS — Z7902 Long term (current) use of antithrombotics/antiplatelets: Secondary | ICD-10-CM | POA: Insufficient documentation

## 2016-04-22 DIAGNOSIS — E7800 Pure hypercholesterolemia, unspecified: Secondary | ICD-10-CM | POA: Diagnosis not present

## 2016-04-22 DIAGNOSIS — X118XXA Contact with other hot tap-water, initial encounter: Secondary | ICD-10-CM | POA: Diagnosis not present

## 2016-04-22 DIAGNOSIS — E1151 Type 2 diabetes mellitus with diabetic peripheral angiopathy without gangrene: Secondary | ICD-10-CM | POA: Diagnosis not present

## 2016-04-22 DIAGNOSIS — E1140 Type 2 diabetes mellitus with diabetic neuropathy, unspecified: Secondary | ICD-10-CM | POA: Insufficient documentation

## 2016-04-22 DIAGNOSIS — Z7982 Long term (current) use of aspirin: Secondary | ICD-10-CM | POA: Diagnosis not present

## 2016-04-22 DIAGNOSIS — I10 Essential (primary) hypertension: Secondary | ICD-10-CM

## 2016-04-22 DIAGNOSIS — T300 Burn of unspecified body region, unspecified degree: Secondary | ICD-10-CM | POA: Diagnosis not present

## 2016-04-22 DIAGNOSIS — E119 Type 2 diabetes mellitus without complications: Secondary | ICD-10-CM | POA: Diagnosis present

## 2016-04-22 DIAGNOSIS — Z79899 Other long term (current) drug therapy: Secondary | ICD-10-CM | POA: Insufficient documentation

## 2016-04-22 LAB — GLUCOSE, POCT (MANUAL RESULT ENTRY): POC GLUCOSE: 196 mg/dL — AB (ref 70–99)

## 2016-04-22 MED ORDER — ACCU-CHEK AVIVA DEVI: 0 refills | Status: AC

## 2016-04-22 MED ORDER — GLUCOSE BLOOD VI STRP: ORAL_STRIP | 12 refills | Status: DC

## 2016-04-22 MED ORDER — ACCU-CHEK SOFTCLIX LANCET DEV MISC: 5 refills | Status: AC

## 2016-04-22 MED ORDER — SILVER SULFADIAZINE 1 % EX CREA: 1.0000 "application " | TOPICAL_CREAM | Freq: Every day | CUTANEOUS | 0 refills | Status: DC

## 2016-04-22 MED ORDER — CARVEDILOL 12.5 MG PO TABS: 12.5000 mg | ORAL_TABLET | Freq: Two times a day (BID) | ORAL | 3 refills | Status: DC

## 2016-04-22 MED FILL — ACCU-CHEK AVIVA PLUS W/DEVI: W/DEVICE | 1 days supply | Qty: 1 | Fill #0

## 2016-04-22 MED FILL — ACCU-CHEK SOFTCLIX LANCETS: 33 days supply | Qty: 100 | Fill #0

## 2016-04-22 MED FILL — ACCU-CHEK AVIVA PLUS TEST S: 30 days supply | Qty: 100 | Fill #0

## 2016-04-22 NOTE — Progress Notes (Signed)
Needs a new meter, test strips and lancets

## 2016-04-22 NOTE — Patient Instructions (Signed)
Burn Care, Adult A burn is an injury to the skin or the tissues under the skin. There are three types of burns:  First degree. These burns may cause the skin to be red and slightly swollen.  Second degree. These burns are very painful and cause the skin to be very red. The skin may also leak fluid, look shiny, and develop blisters.  Third degree. These burns cause permanent damage. They either turn the skin white or black and make it look charred, dry, and leathery. Taking care of your burn properly can help to prevent pain and infection. It can also help the burn to heal more quickly. What are the risks? Complications from burns include:  Damage to the skin.  Reduced blood flow near the injury.  Dead tissue.  Scarring.  Problems with movement, if the burn happened near a joint or on the hands or feet. Severe burns can lead to problems that affect the whole body, such as:  Fluid loss.  Less blood circulating in the body.  Inability to maintain a normal core body temperature (thermoregulation).  Infection.  Shock.  Problems breathing. How to care for a first-degree burn Right after a burn:   Rinse or soak the burn under cool water until the pain stops. Do not put ice on your burn. This can cause more damage.  Lightly cover the burn with a sterile cloth (dressing). Burn care   Follow instructions from your health care provider about:  How to clean and take care of the burn.  When to change and remove the dressing.  Check your burn every day for signs of infection. Check for:  More redness, swelling, or pain.  Warmth.  Pus or a bad smell. Medicine   Take over-the-counter and prescription medicines only as told by your health care provider.  If you were prescribed antibiotic medicine, take or apply it as told by your health care provider. Do not stop using the antibiotic even if your condition improves. General instructions   To prevent infection, do not put  butter, oil, or other home remedies on your burn.  Do not rub your burn, even when you are cleaning it.  Protect your burn from the sun. How to care for a second-degree burn Right after a burn:   Rinse or soak the burn under cool water. Do this for several minutes. Do not put ice on your burn. This can cause more damage.  Lightly cover the burn with a sterile cloth (dressing). Burn care   Raise (elevate) the injured area above the level of your heart while sitting or lying down.  Follow instructions from your health care provider about:  How to clean and take care of the burn.  When to change and remove the dressing.  Check your burn every day for signs of infection. Check for:  More redness, swelling, or pain.  Warmth.  Pus or a bad smell. Medicine    Take over-the-counter and prescription medicines only as told by your health care provider.  If you were prescribed antibiotic medicine, take or apply it as told by your health care provider. Do not stop using the antibiotic even if your condition improves. General instructions   To prevent infection:  Do not put butter, oil, or other home remedies on the burn.  Do not scratch or pick at the burn.  Do not break any blisters.  Do not peel skin.  Do not rub your burn, even when you are cleaning it.  Protect your burn from the sun. How to care for a third-degree burn Right after a burn:   Lightly cover the burn with gauze.  Seek immediate medical attention. Burn care   Raise (elevate) the injured area above the level of your heart while sitting or lying down.  Drink enough fluid to keep your urine clear or pale yellow.  Rest as told by your health care provider. Do not participate in sports or other physical activities until your health care provider approves.  Follow instructions from your health care provider about:  How to clean and take care of the burn.  When to change and remove the  dressing.  Check your burn every day for signs of infection. Check for:  More redness, swelling, or pain.  Warmth.  Pus or a bad smell. Medicine   Take over-the-counter and prescription medicines only as told by your health care provider.  If you were prescribed antibiotic medicine, take or apply it as told by your health care provider. Do not stop using the antibiotic even if your condition improves. General instructions   To prevent infection:  Do not put butter, oil, or other home remedies on the burn.  Do not scratch or pick at the burn.  Do not break any blisters.  Do not peel skin.  Do not rub your burn, even when you are cleaning it.  Protect your burn from the sun.  Keep all follow-up visits as told by your health care provider. This is important. Contact a health care provider if:  Your condition does not improve.  Your condition gets worse.  You have a fever.  Your burn changes in appearance or develops black or red spots.  Your burn feels warm to the touch.  Your pain is not controlled with medicine. Get help right away if:  You have redness, swelling, or pain at the site of the burn.  You have fluid, blood, or pus coming from your burn.  You have red streaks near the burn.  You have severe pain. This information is not intended to replace advice given to you by your health care provider. Make sure you discuss any questions you have with your health care provider. Document Released: 12/22/2004 Document Revised: 07/14/2015 Document Reviewed: 06/11/2015 Elsevier Interactive Patient Education  2017 [employer].

## 2016-04-22 NOTE — Progress Notes (Signed)
Subjective:  Patient ID: Rebekah Johnson, female    DOB: 04/03/1960  Age: 56 y.o. MRN: [id number]  CC: Diabetes; Burn (left shouler- boiling water); and dental clearance   HPI Rebekah Johnson is a 56 year old female with a history of type 2 diabetes mellitus (A1c 7.4), diabetic neuropathy, peripheral vascular disease (status post left SFA and popliteal stent and left SFA PTA with drug coated balloon in 08/2015),Left foot transmetatarsal amputation,  tobacco abuse, hypertension who presents today for follow-up visit.   She had presented for dental work and was found to have severely elevated blood pressure and she admits to not taking her antihypertensives prior to that visit and medical clearance is requested prior to the procedure. Today her blood pressure is elevated despite taking her antihypertensives.  She also has a left upper back hot water burn which she sustained 6 days ago and has been applying Betadine. There are no blisters and she has no drainage from it.  Past Medical History:  Diagnosis Date  . Diabetes mellitus without complication ([place])   . High cholesterol   . Hypertension     Past Surgical History:  Procedure Laterality Date  . ABDOMINAL HYSTERECTOMY    . AMPUTATION Left 09/06/2015   Procedure: LEFT TRANSMETATARSAL AMPUTATTION;  Surgeon: Newt Minion, MD;  Location: [facility];  Service: Orthopedics;  Laterality: Left;  . PERIPHERAL VASCULAR CATHETERIZATION N/A 08/16/2015   Procedure: Abdominal Aortogram;  Surgeon: Elam Dutch, MD;  Location: [facility];  Service: Cardiovascular;  Laterality: N/A;  . PERIPHERAL VASCULAR CATHETERIZATION Bilateral 08/16/2015   Procedure: Lower Extremity Angiography;  Surgeon: Elam Dutch, MD;  Location: [facility];  Service: Cardiovascular;  Laterality: Bilateral;  . PERIPHERAL VASCULAR CATHETERIZATION Left 08/16/2015   Procedure: Peripheral Vascular Intervention;  Surgeon: Elam Dutch, MD;  Location: [facility];  Service: Cardiovascular;  Laterality: Left;  SFA STENT X 2     Outpatient Medications Prior to Visit  Medication Sig Dispense Refill  . amLODipine (NORVASC) 10 MG tablet Take 1 tablet (10 mg total) by mouth daily. Reported on 07/13/2015 30 tablet 3  . aspirin 81 MG EC tablet Take 1 tablet (81 mg total) by mouth daily. Reported on 07/13/2015 30 tablet 3  . atorvastatin (LIPITOR) 40 MG tablet Take 1 tablet (40 mg total) by mouth daily. Reported on 07/13/2015 30 tablet 3  . blood glucose meter kit and supplies KIT Dispense based on patient and insurance preference. Use up to four times daily as directed. (FOR ICD-9 250.00, 250.01). 1 each 3  . Blood Glucose Monitoring Suppl (ACCU-CHEK AVIVA) device Use as instructed three times daily. 1 each 0  . Cholecalciferol (VITAMIN D-3) 1000 units CAPS Take 1 capsule by mouth daily.    . clopidogrel (PLAVIX) 75 MG tablet Take 1 tablet (75 mg total) by mouth daily. 30 tablet 3  . gabapentin (NEURONTIN) 600 MG tablet TAKE ONE TABLET BY MOUTH THREE TIMES DAILY AS NEEDED FOR NEUROPATHY PAIN 90 tablet 3  . gemfibrozil (LOPID) 600 MG tablet Take 1 tablet by mouth 2 (two) times daily. Reported on 07/13/2015    . glipiZIDE (GLUCOTROL) 10 MG tablet Take 1 tablet (10 mg total) by mouth 2 (two) times daily before a meal. 60 tablet 3  . glucose blood (ACCU-CHEK AVIVA) test strip Use as instructed three times daily before meals. 100 each 12  . isosorbide mononitrate (IMDUR) 60 MG 24 hr tablet Take 1 tablet (60 mg total) by mouth  daily. 30 tablet 3  . Lancet Devices (ACCU-CHEK SOFTCLIX) lancets Use as instructed three times daily before meals. 1 each 5  . lisinopril (PRINIVIL,ZESTRIL) 10 MG tablet Take 1 tablet (10 mg total) by mouth daily. 30 tablet 3  . Omega-3 Fatty Acids (FISH OIL) 1000 MG CAPS Take 1 capsule by mouth daily. Reported on 07/13/2015    . sitaGLIPtin (JANUVIA) 50 MG tablet Take 1 tablet (50 mg total) by mouth daily. 90 tablet 1  . carvedilol (COREG) 6.25  MG tablet Take 1 tablet (6.25 mg total) by mouth 2 (two) times daily with a meal. Reported on 07/03/2015 60 tablet 3   No facility-administered medications prior to visit.     ROS Review of Systems  Constitutional: Negative for activity change and appetite change.  HENT: Negative for sinus pressure and sore throat.   Respiratory: Negative for chest tightness, shortness of breath and wheezing.   Cardiovascular: Negative for chest pain and palpitations.  Gastrointestinal: Negative for abdominal distention, abdominal pain and constipation.  Genitourinary: Negative.   Musculoskeletal: Negative.   Skin:       See hpi  Psychiatric/Behavioral: Negative for behavioral problems and dysphoric mood.    Objective:  BP (!) 165/60 (BP Location: Right Arm, Patient Position: Sitting, Cuff Size: Small)   Pulse 72   Temp 98 F (36.7 C) (Oral)   Ht '5\' 4"'  (1.626 m)   Wt 204 lb 3.2 oz (92.6 kg)   SpO2 (!) 86%   BMI 35.05 kg/m   BP/Weight 04/22/2016 04/15/2016 04/20/6061  Systolic BP 016 010 932  Diastolic BP 60 95 68  Wt. (Lbs) 204.2 197.1 187  BMI 35.05 33.83 32.1    Physical Exam  Constitutional: She is oriented to person, place, and time. She appears well-developed and well-nourished.  Cardiovascular: Normal rate, normal heart sounds and intact distal pulses.   No murmur heard. Pulmonary/Chest: Effort normal and breath sounds normal. She has no wheezes. She has no rales. She exhibits no tenderness.  Abdominal: Soft. Bowel sounds are normal. She exhibits no distension and no mass. There is no tenderness.  Musculoskeletal: Normal range of motion.  Neurological: She is alert and oriented to person, place, and time.  Skin:  Burn measuring 5x7cm on left upper back with an unroofed blister and mild erythema. No drainage    Lab Results  Component Value Date   HGBA1C 7.4 02/25/2016    Assessment & Plan:   1. Essential (primary) hypertension Uncontrolled Increased dose of  carvedilol Low-sodium diet If blood pressure is controlled at next visit, will complete clearance for dental work. - carvedilol (COREG) 12.5 MG tablet; Take 1 tablet (12.5 mg total) by mouth 2 (two) times daily with a meal.  Dispense: 60 tablet; Refill: 3  2. Burn - silver sulfADIAZINE (SILVADENE) 1 % cream; Apply 1 application topically daily.  Dispense: 50 g; Refill: 0  3. Type 2 diabetes mellitus with hyperosmolarity without coma, without long-term current use of insulin (HCC) A1c 7.4 Continue current management - Glucose (CBG)   Meds ordered this encounter  Medications  . silver sulfADIAZINE (SILVADENE) 1 % cream    Sig: Apply 1 application topically daily.    Dispense:  50 g    Refill:  0  . carvedilol (COREG) 12.5 MG tablet    Sig: Take 1 tablet (12.5 mg total) by mouth 2 (two) times daily with a meal.    Dispense:  60 tablet    Refill:  3    Discontinue her  previous dose    Follow-up: Return in about 2 weeks (around 05/06/2016) for Follow-up on hypertension.   Arnoldo Morale MD

## 2016-04-27 ENCOUNTER — Ambulatory Visit (INDEPENDENT_AMBULATORY_CARE_PROVIDER_SITE_OTHER): Payer: Medicaid Other | Admitting: Orthopedic Surgery

## 2016-04-27 ENCOUNTER — Encounter (INDEPENDENT_AMBULATORY_CARE_PROVIDER_SITE_OTHER): Payer: Self-pay | Admitting: Orthopedic Surgery

## 2016-04-27 VITALS — Ht 64.0 in | Wt 204.0 lb

## 2016-04-27 DIAGNOSIS — L97513 Non-pressure chronic ulcer of other part of right foot with necrosis of muscle: Secondary | ICD-10-CM

## 2016-04-27 DIAGNOSIS — B351 Tinea unguium: Secondary | ICD-10-CM | POA: Diagnosis not present

## 2016-04-27 MED ORDER — MUPIROCIN 2 % EX OINT: 1.0000 "application " | TOPICAL_OINTMENT | Freq: Two times a day (BID) | CUTANEOUS | 6 refills | Status: DC

## 2016-04-27 MED ORDER — DOXYCYCLINE HYCLATE 100 MG PO TABS: 100.0000 mg | ORAL_TABLET | Freq: Two times a day (BID) | ORAL | 0 refills | Status: DC

## 2016-04-27 NOTE — Progress Notes (Signed)
Office Visit Note   Patient: Rebekah Johnson           Date of Birth: 12-Jun-1960           MRN: [id number] Visit Date: 04/27/2016              Requested by: Arnoldo Morale, MD [place], [place] [address] PCP: Arnoldo Morale, MD  Chief Complaint  Patient presents with  . Left Foot - Follow-up    7 months out transmet amputuation      HPI: Patient is a 56 year old woman who presents today for evaluation of the right foot ulceration plantar aspect of her toe. This she noticed yesterday. Also requesting a nail trim. She has thickened and discolored onychomycotic nails right foot. Her left transmetatarsal amputation is well-healed. Denies any drainage.  Assessment & Plan: Visit Diagnoses:  1. Perforating ulcer of the foot, right, with necrosis of muscle ([place])     Plan: The patient will do daily wound cleansing. Apply antibacterial ointment and dressing. Have provided a Darco shoe to offload the forefoot. Nails were trimmed today without incident. Provided a prescription for doxycycline as well as she'll follow-up in 2 weeks.  Follow-Up Instructions: Return in about 2 weeks (around 05/11/2016).   Ortho Exam  Patient is alert, oriented, no adenopathy, well-dressed, normal affect, normal respiratory effort. Left transmetatarsal amputation is well-healed. The right foot she has a new anterior ulceration beneath the second metatarsal head. This probes 1 cm deep. There is surrounding maceration. The ulceration is 5 mm in diameter there is necrotic tissue in the wound bed. There is no drainage noted today no erythema. 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 She does have thickened and discolored onychomycotic nails 5 right foot. She is unable to safely trim her own nails due to insensate neuropathy these were trimmed today in office without incident.  Imaging: No results found.  Labs: Lab Results    Component Value Date   HGBA1C 7.4 02/25/2016   HGBA1C 6.1 10/23/2015   HGBA1C 7.5 07/19/2015   ESRSEDRATE 57 (H) 07/13/2015   CRP 0.7 07/13/2015   LABURIC 5.7 07/13/2015   REPTSTATUS 07/18/2015 FINAL 07/13/2015   CULT NO GROWTH 5 DAYS 07/13/2015    Orders:  No orders of the defined types were placed in this encounter.  Meds ordered this encounter  Medications  . doxycycline (VIBRA-TABS) 100 MG tablet    Sig: Take 1 tablet (100 mg total) by mouth 2 (two) times daily.    Dispense:  60 tablet    Refill:  0  . mupirocin ointment (BACTROBAN) 2 %    Sig: Apply 1 application topically 2 (two) times daily.    Dispense:  22 g    Refill:  6     Procedures: No procedures performed  Clinical Data: No additional findings.  ROS:  All other systems negative, except as noted in the HPI. Review of Systems  Constitutional: Negative for chills and fever.  Skin: Positive for wound.    Objective: Vital Signs: Ht 5\' 4"  (1.626 m)   Wt 204 lb (92.5 kg)   BMI 35.02 kg/m   Specialty Comments:  No specialty comments available.  PMFS History: Patient Active Problem List   Diagnosis Date Noted  . Onychomycosis 12/24/2015  . Status post transmetatarsal amputation of foot, left ([place]) 09/06/2015  . Diabetic foot ulcer ([place]) 07/13/2015  . AKI (acute  kidney injury) ([place]) 07/13/2015  . Cellulitis 07/12/2015  . HLD (hyperlipidemia) 09/02/2012  . Peripheral nerve disease 09/02/2012  . Type 2 diabetes mellitus with peripheral angiopathy ([place]) 09/02/2012  . Hypertriglyceridemia 07/28/2012  . Peripheral vascular disease ([place]) 07/28/2012  . Avitaminosis D 07/28/2012  . Essential (primary) hypertension 06/10/2012  . History of biliary T-tube placement 06/10/2012  . Leg pain 06/10/2012  . Current tobacco use 06/10/2012  . Atrophic vaginitis 06/07/2012  . Type 2 diabetes mellitus ([place]) 06/07/2012   Past Medical History:  Diagnosis Date  . Diabetes mellitus without complication ([place])    . High cholesterol   . Hypertension     Family History  Problem Relation Age of Onset  . Diabetes Mother   . Hypertension Father     Past Surgical History:  Procedure Laterality Date  . ABDOMINAL HYSTERECTOMY    . AMPUTATION Left 09/06/2015   Procedure: LEFT TRANSMETATARSAL AMPUTATTION;  Surgeon: Newt Minion, MD;  Location: [facility];  Service: Orthopedics;  Laterality: Left;  . PERIPHERAL VASCULAR CATHETERIZATION N/A 08/16/2015   Procedure: Abdominal Aortogram;  Surgeon: Elam Dutch, MD;  Location: [facility];  Service: Cardiovascular;  Laterality: N/A;  . PERIPHERAL VASCULAR CATHETERIZATION Bilateral 08/16/2015   Procedure: Lower Extremity Angiography;  Surgeon: Elam Dutch, MD;  Location: [facility];  Service: Cardiovascular;  Laterality: Bilateral;  . PERIPHERAL VASCULAR CATHETERIZATION Left 08/16/2015   Procedure: Peripheral Vascular Intervention;  Surgeon: Elam Dutch, MD;  Location: [facility];  Service: Cardiovascular;  Laterality: Left;  SFA STENT X 2   Social History   Occupational History  . Not on file.   Social History Main Topics  . Smoking status: Current Every Day Smoker    Packs/day: 0.25    Types: Cigarettes  . Smokeless tobacco: Never Used     Comment: 3 daily  . Alcohol use 0.6 - 1.2 oz/week    1 - 2 Glasses of wine per week     Comment: on social occassions  . Drug use: No  . Sexual activity: Yes    Partners: Male

## 2016-04-29 ENCOUNTER — Encounter (HOSPITAL_COMMUNITY): Payer: Medicaid Other

## 2016-04-29 ENCOUNTER — Encounter (HOSPITAL_COMMUNITY): Payer: Self-pay

## 2016-05-08 ENCOUNTER — Ambulatory Visit: Payer: Medicaid Other | Attending: Family Medicine | Admitting: Family Medicine

## 2016-05-08 ENCOUNTER — Encounter: Payer: Self-pay | Admitting: Family Medicine

## 2016-05-08 VITALS — BP 176/69 | HR 60 | Temp 97.7°F | Resp 18 | Ht 64.0 in | Wt 194.8 lb

## 2016-05-08 DIAGNOSIS — E1149 Type 2 diabetes mellitus with other diabetic neurological complication: Secondary | ICD-10-CM | POA: Diagnosis not present

## 2016-05-08 DIAGNOSIS — E1100 Type 2 diabetes mellitus with hyperosmolarity without nonketotic hyperglycemic-hyperosmolar coma (NKHHC): Secondary | ICD-10-CM

## 2016-05-08 DIAGNOSIS — X58XXXA Exposure to other specified factors, initial encounter: Secondary | ICD-10-CM | POA: Diagnosis not present

## 2016-05-08 DIAGNOSIS — Z7902 Long term (current) use of antithrombotics/antiplatelets: Secondary | ICD-10-CM | POA: Diagnosis not present

## 2016-05-08 DIAGNOSIS — Z720 Tobacco use: Secondary | ICD-10-CM | POA: Diagnosis not present

## 2016-05-08 DIAGNOSIS — E7800 Pure hypercholesterolemia, unspecified: Secondary | ICD-10-CM

## 2016-05-08 DIAGNOSIS — I10 Essential (primary) hypertension: Secondary | ICD-10-CM

## 2016-05-08 DIAGNOSIS — E1140 Type 2 diabetes mellitus with diabetic neuropathy, unspecified: Secondary | ICD-10-CM | POA: Insufficient documentation

## 2016-05-08 DIAGNOSIS — T148XXA Other injury of unspecified body region, initial encounter: Secondary | ICD-10-CM

## 2016-05-08 DIAGNOSIS — I739 Peripheral vascular disease, unspecified: Secondary | ICD-10-CM

## 2016-05-08 DIAGNOSIS — Z7982 Long term (current) use of aspirin: Secondary | ICD-10-CM | POA: Insufficient documentation

## 2016-05-08 DIAGNOSIS — Z7984 Long term (current) use of oral hypoglycemic drugs: Secondary | ICD-10-CM | POA: Insufficient documentation

## 2016-05-08 LAB — GLUCOSE, POCT (MANUAL RESULT ENTRY): POC GLUCOSE: 300 mg/dL — AB (ref 70–99)

## 2016-05-08 MED ORDER — AMLODIPINE BESYLATE 10 MG PO TABS: 10.0000 mg | ORAL_TABLET | Freq: Every day | ORAL | 3 refills | Status: DC

## 2016-05-08 MED ORDER — CARVEDILOL 12.5 MG PO TABS
12.5000 mg | ORAL_TABLET | Freq: Two times a day (BID) | ORAL | 3 refills | Status: DC
Start: 2016-05-08 — End: 2016-06-02

## 2016-05-08 MED ORDER — CLOPIDOGREL BISULFATE 75 MG PO TABS: 75.0000 mg | ORAL_TABLET | Freq: Every day | ORAL | 3 refills | Status: DC

## 2016-05-08 MED ORDER — LISINOPRIL 20 MG PO TABS: 10.0000 mg | ORAL_TABLET | Freq: Every day | ORAL | 3 refills | Status: DC

## 2016-05-08 MED ORDER — ATORVASTATIN CALCIUM 40 MG PO TABS: 40.0000 mg | ORAL_TABLET | Freq: Every day | ORAL | 3 refills | Status: DC

## 2016-05-08 MED ORDER — ASPIRIN 81 MG PO TBEC: 81.0000 mg | DELAYED_RELEASE_TABLET | Freq: Every day | ORAL | 3 refills | Status: DC

## 2016-05-08 MED ORDER — ISOSORBIDE MONONITRATE ER 60 MG PO TB24: 60.0000 mg | ORAL_TABLET | Freq: Every day | ORAL | 3 refills | Status: DC

## 2016-05-08 MED ORDER — GLIPIZIDE 10 MG PO TABS: 10.0000 mg | ORAL_TABLET | Freq: Two times a day (BID) | ORAL | 3 refills | Status: DC

## 2016-05-08 MED ORDER — GABAPENTIN 600 MG PO TABS: ORAL_TABLET | ORAL | 3 refills | Status: DC

## 2016-05-08 NOTE — Progress Notes (Signed)
F/U HTN  Taking medication daily as prescribed  Tobacco user 1 pack per two days Pain scale #0 No suicidal thoughts in the past two weeks Elevated glucose, pt stated had heavy breakfast one hour ago. Took medication at 08:00 am

## 2016-05-08 NOTE — Progress Notes (Signed)
Subjective:  Patient ID: Rebekah Johnson, female    DOB: 1960/02/12  Age: 56 y.o. MRN: [id number]  CC: Hypertension   HPI Rebekah Johnson is a 56 year old female with a history of type 2 diabetes mellitus (A1c 7.4), diabetic neuropathy, peripheral vascular disease (status post left SFA and popliteal stent and left SFA PTA with drug coated balloon in 08/2015),Left foot transmetatarsal amputation,  tobacco abuse, hypertension who presents today for follow-up visit.   She Is needing completion of paperwork for clearance for dental procedure as she had presented for dental work and was found to have severely elevated blood pressure and she admits to not taking her antihypertensives prior to that visit. Carvedilol was increased at her last visit and today her blood pressure is elevated despite taking her antihypertensives. She endorses being under a lot of stress.  Sustained a blister to the sole of her right foot while cutting her grass and this has been evaluated by a foot doctor - Dr Sharol Given. She denies drainage from the blister denies fever.  Past Medical History:  Diagnosis Date  . Diabetes mellitus without complication ([place])   . High cholesterol   . Hypertension     Past Surgical History:  Procedure Laterality Date  . ABDOMINAL HYSTERECTOMY    . AMPUTATION Left 09/06/2015   Procedure: LEFT TRANSMETATARSAL AMPUTATTION;  Surgeon: Newt Minion, MD;  Location: [facility];  Service: Orthopedics;  Laterality: Left;  . PERIPHERAL VASCULAR CATHETERIZATION N/A 08/16/2015   Procedure: Abdominal Aortogram;  Surgeon: Elam Dutch, MD;  Location: [facility];  Service: Cardiovascular;  Laterality: N/A;  . PERIPHERAL VASCULAR CATHETERIZATION Bilateral 08/16/2015   Procedure: Lower Extremity Angiography;  Surgeon: Elam Dutch, MD;  Location: [facility];  Service: Cardiovascular;  Laterality: Bilateral;  . PERIPHERAL VASCULAR CATHETERIZATION Left 08/16/2015   Procedure: Peripheral  Vascular Intervention;  Surgeon: Elam Dutch, MD;  Location: [facility];  Service: Cardiovascular;  Laterality: Left;  SFA STENT X 2    No Known Allergies    Outpatient Medications Prior to Visit  Medication Sig Dispense Refill  . blood glucose meter kit and supplies KIT Dispense based on patient and insurance preference. Use up to four times daily as directed. (FOR ICD-9 250.00, 250.01). 1 each 3  . Blood Glucose Monitoring Suppl (ACCU-CHEK AVIVA) device Use as instructed three times daily. 1 each 0  . Cholecalciferol (VITAMIN D-3) 1000 units CAPS Take 1 capsule by mouth daily.    [name] doxycycline (VIBRA-TABS) 100 MG tablet Take 1 tablet (100 mg total) by mouth 2 (two) times daily. 60 tablet 0  . glucose blood (ACCU-CHEK AVIVA) test strip Use as instructed three times daily before meals. 100 each 12  . Lancet Devices (ACCU-CHEK SOFTCLIX) lancets Use as instructed three times daily before meals. 1 each 5  . Omega-3 Fatty Acids (FISH OIL) 1000 MG CAPS Take 1 capsule by mouth daily. Reported on 07/13/2015    . silver sulfADIAZINE (SILVADENE) 1 % cream Apply 1 application topically daily. 50 g 0  . sitaGLIPtin (JANUVIA) 50 MG tablet Take 1 tablet (50 mg total) by mouth daily. 90 tablet 1  . amLODipine (NORVASC) 10 MG tablet Take 1 tablet (10 mg total) by mouth daily. Reported on 07/13/2015 30 tablet 3  . aspirin 81 MG EC tablet Take 1 tablet (81 mg total) by mouth daily. Reported on 07/13/2015 30 tablet 3  . atorvastatin (LIPITOR) 40 MG tablet Take 1 tablet (40 mg total) by  mouth daily. Reported on 07/13/2015 30 tablet 3  . carvedilol (COREG) 12.5 MG tablet Take 1 tablet (12.5 mg total) by mouth 2 (two) times daily with a meal. 60 tablet 3  . clopidogrel (PLAVIX) 75 MG tablet Take 1 tablet (75 mg total) by mouth daily. 30 tablet 3  . gabapentin (NEURONTIN) 600 MG tablet TAKE ONE TABLET BY MOUTH THREE TIMES DAILY AS NEEDED FOR NEUROPATHY PAIN 90 tablet 3  . glipiZIDE (GLUCOTROL) 10 MG tablet  Take 1 tablet (10 mg total) by mouth 2 (two) times daily before a meal. 60 tablet 3  . isosorbide mononitrate (IMDUR) 60 MG 24 hr tablet Take 1 tablet (60 mg total) by mouth daily. 30 tablet 3  . lisinopril (PRINIVIL,ZESTRIL) 10 MG tablet Take 1 tablet (10 mg total) by mouth daily. 30 tablet 3  . gemfibrozil (LOPID) 600 MG tablet Take 1 tablet by mouth 2 (two) times daily. Reported on 07/13/2015    . mupirocin ointment (BACTROBAN) 2 % Apply 1 application topically 2 (two) times daily. (Patient not taking: Reported on 05/08/2016) 22 g 6   No facility-administered medications prior to visit.     ROS Review of Systems  Constitutional: Negative for activity change, appetite change and fatigue.  HENT: Negative for congestion, sinus pressure and sore throat.   Eyes: Negative for visual disturbance.  Respiratory: Negative for cough, chest tightness, shortness of breath and wheezing.   Cardiovascular: Negative for chest pain and palpitations.  Gastrointestinal: Negative for abdominal distention, abdominal pain and constipation.  Endocrine: Negative for polydipsia.  Genitourinary: Negative for dysuria and frequency.  Musculoskeletal: Negative for arthralgias and back pain.  Skin:       See hpi  Neurological: Negative for tremors, light-headedness and numbness.  Hematological: Does not bruise/bleed easily.  Psychiatric/Behavioral: Negative for agitation and behavioral problems.    Objective:  BP (!) 176/69 (BP Location: Left Arm, Patient Position: Sitting, Cuff Size: Normal)   Pulse 60   Temp 97.7 F (36.5 C) (Oral)   Resp 18   Ht '5\' 4"'  (1.626 m)   Wt 194 lb 12.8 oz (88.4 kg)   BMI 33.44 kg/m   BP/Weight 05/08/2016 04/27/2016 1/69/6789  Systolic BP 381 - 017  Diastolic BP 69 - 60  Wt. (Lbs) 194.8 204 204.2  BMI 33.44 35.02 35.05      Physical Exam Constitutional: She is oriented to person, place, and time. She appears well-developed and well-nourished.  Cardiovascular: Normal rate,  normal heart sounds and intact distal pulses.   No murmur heard. Pulmonary/Chest: Effort normal and breath sounds normal. She has no wheezes. She has no rales. She exhibits no tenderness.  Abdominal: Soft. Bowel sounds are normal. She exhibits no distension and no mass. There is no tenderness.  Musculoskeletal: Normal range of motion.  Neurological: She is alert and oriented to person, place, and time.  Skin:   blister on the anterior aspect of swollen right foot which is nontender and not draining.  Lab Results  Component Value Date   HGBA1C 7.4 02/25/2016    CMP Latest Ref Rng & Units 12/18/2015 09/06/2015 08/16/2015  Glucose 65 - 99 mg/dL 194(H) 105(H) 158(H)  BUN 7 - 25 mg/dL '10 8 12  ' Creatinine 0.50 - 1.05 mg/dL 1.19(H) 1.06(H) 1.00  Sodium 135 - 146 mmol/L 142 140 142  Potassium 3.5 - 5.3 mmol/L 4.0 3.7 3.3(L)  Chloride 98 - 110 mmol/L 103 104 100(L)  CO2 20 - 31 mmol/L 30 28 -  Calcium 8.6 -  10.4 mg/dL 10.1 9.7 -  Total Protein 6.1 - 8.1 g/dL 7.2 - -  Total Bilirubin 0.2 - 1.2 mg/dL 0.3 - -  Alkaline Phos 33 - 130 U/L 101 - -  AST 10 - 35 U/L 13 - -  ALT 6 - 29 U/L 11 - -    Lipid Panel     Component Value Date/Time   CHOL 123 (L) 08/07/2015 1056   TRIG 154 (H) 08/07/2015 1056   HDL 39 (L) 08/07/2015 1056   CHOLHDL 3.2 08/07/2015 1056   VLDL 31 (H) 08/07/2015 1056   LDLCALC 53 08/07/2015 1056    Assessment & Plan:   1. Type 2 diabetes mellitus with hyperosmolarity without coma, without long-term current use of insulin (HCC) Controlled with A1c of 7.4 Continue medications - POCT glucose (manual entry) - CMP14+EGFR  2. Essential (primary) hypertension Uncontrolled Blood pressure performed manually is 180/60 Increase dose of lisinopril Low-sodium diet - lisinopril (PRINIVIL,ZESTRIL) 20 MG tablet; Take 0.5 tablets (10 mg total) by mouth daily.  Dispense: 30 tablet; Refill: 3 - amLODipine (NORVASC) 10 MG tablet; Take 1 tablet (10 mg total) by mouth daily.  Reported on 07/13/2015  Dispense: 30 tablet; Refill: 3 - aspirin 81 MG EC tablet; Take 1 tablet (81 mg total) by mouth daily. Reported on 07/13/2015  Dispense: 30 tablet; Refill: 3 - carvedilol (COREG) 12.5 MG tablet; Take 1 tablet (12.5 mg total) by mouth 2 (two) times daily with a meal.  Dispense: 60 tablet; Refill: 3 - isosorbide mononitrate (IMDUR) 60 MG 24 hr tablet; Take 1 tablet (60 mg total) by mouth daily.  Dispense: 30 tablet; Refill: 3  3. Pure hypercholesterolemia Controlled - atorvastatin (LIPITOR) 40 MG tablet; Take 1 tablet (40 mg total) by mouth daily. Reported on 07/13/2015  Dispense: 30 tablet; Refill: 3  4. Peripheral vascular disease (HCC) Stable Risk factor modification - clopidogrel (PLAVIX) 75 MG tablet; Take 1 tablet (75 mg total) by mouth daily.  Dispense: 30 tablet; Refill: 3  5. Other diabetic neurological complication associated with type 2 diabetes mellitus (HCC) Stable - gabapentin (NEURONTIN) 600 MG tablet; TAKE ONE TABLET BY MOUTH THREE TIMES DAILY AS NEEDED FOR NEUROPATHY PAIN  Dispense: 90 tablet; Refill: 3 - glipiZIDE (GLUCOTROL) 10 MG tablet; Take 1 tablet (10 mg total) by mouth 2 (two) times daily before a meal.  Dispense: 60 tablet; Refill: 3  6. Blister Dressing change performed in the clinic No evidence of infection   Meds ordered this encounter  Medications  . lisinopril (PRINIVIL,ZESTRIL) 20 MG tablet    Sig: Take 0.5 tablets (10 mg total) by mouth daily.    Dispense:  30 tablet    Refill:  3    Discontinue previous dose  . amLODipine (NORVASC) 10 MG tablet    Sig: Take 1 tablet (10 mg total) by mouth daily. Reported on 07/13/2015    Dispense:  30 tablet    Refill:  3  . aspirin 81 MG EC tablet    Sig: Take 1 tablet (81 mg total) by mouth daily. Reported on 07/13/2015    Dispense:  30 tablet    Refill:  3  . atorvastatin (LIPITOR) 40 MG tablet    Sig: Take 1 tablet (40 mg total) by mouth daily. Reported on 07/13/2015    Dispense:  30 tablet     Refill:  3  . carvedilol (COREG) 12.5 MG tablet    Sig: Take 1 tablet (12.5 mg total) by mouth 2 (two) times daily  with a meal.    Dispense:  60 tablet    Refill:  3    Discontinue her previous dose  . clopidogrel (PLAVIX) 75 MG tablet    Sig: Take 1 tablet (75 mg total) by mouth daily.    Dispense:  30 tablet    Refill:  3  . gabapentin (NEURONTIN) 600 MG tablet    Sig: TAKE ONE TABLET BY MOUTH THREE TIMES DAILY AS NEEDED FOR NEUROPATHY PAIN    Dispense:  90 tablet    Refill:  3  . glipiZIDE (GLUCOTROL) 10 MG tablet    Sig: Take 1 tablet (10 mg total) by mouth 2 (two) times daily before a meal.    Dispense:  60 tablet    Refill:  3  . isosorbide mononitrate (IMDUR) 60 MG 24 hr tablet    Sig: Take 1 tablet (60 mg total) by mouth daily.    Dispense:  30 tablet    Refill:  3    Follow-up: Return in about 2 weeks (around 05/22/2016) for Follow-up on hypertension.   Arnoldo Morale MD

## 2016-05-09 LAB — CMP14+EGFR
A/G RATIO: 1.2 (ref 1.2–2.2)
ALT: 13 IU/L (ref 0–32)
AST: 14 IU/L (ref 0–40)
Albumin: 3.5 g/dL (ref 3.5–5.5)
Alkaline Phosphatase: 105 IU/L (ref 39–117)
BUN/Creatinine Ratio: 13 (ref 9–23)
BUN: 17 mg/dL (ref 6–24)
CHLORIDE: 99 mmol/L (ref 96–106)
CO2: 22 mmol/L (ref 18–29)
Calcium: 9.2 mg/dL (ref 8.7–10.2)
Creatinine, Ser: 1.3 mg/dL — ABNORMAL HIGH (ref 0.57–1.00)
GFR calc Af Amer: 52 mL/min/{1.73_m2} — ABNORMAL LOW (ref >59)
GFR calc non Af Amer: 45 mL/min/{1.73_m2} — ABNORMAL LOW (ref >59)
Globulin, Total: 3 g/dL (ref 1.5–4.5)
Glucose: 347 mg/dL — ABNORMAL HIGH (ref 65–99)
POTASSIUM: 4 mmol/L (ref 3.5–5.2)
Sodium: 140 mmol/L (ref 134–144)
Total Protein: 6.5 g/dL (ref 6.0–8.5)

## 2016-05-11 ENCOUNTER — Ambulatory Visit (INDEPENDENT_AMBULATORY_CARE_PROVIDER_SITE_OTHER): Payer: Medicaid Other

## 2016-05-11 ENCOUNTER — Telehealth: Payer: Self-pay | Admitting: *Deleted

## 2016-05-11 ENCOUNTER — Encounter (INDEPENDENT_AMBULATORY_CARE_PROVIDER_SITE_OTHER): Payer: Self-pay | Admitting: Orthopedic Surgery

## 2016-05-11 ENCOUNTER — Other Ambulatory Visit: Payer: Self-pay | Admitting: Pharmacist

## 2016-05-11 ENCOUNTER — Ambulatory Visit (INDEPENDENT_AMBULATORY_CARE_PROVIDER_SITE_OTHER): Payer: Medicaid Other | Admitting: Orthopedic Surgery

## 2016-05-11 VITALS — Ht 64.0 in | Wt 194.0 lb

## 2016-05-11 DIAGNOSIS — L97419 Non-pressure chronic ulcer of right heel and midfoot with unspecified severity: Secondary | ICD-10-CM

## 2016-05-11 DIAGNOSIS — E11621 Type 2 diabetes mellitus with foot ulcer: Secondary | ICD-10-CM

## 2016-05-11 MED ORDER — GABAPENTIN 300 MG PO CAPS: 600.0000 mg | ORAL_CAPSULE | Freq: Three times a day (TID) | ORAL | 3 refills | Status: DC | PRN

## 2016-05-11 NOTE — Progress Notes (Signed)
Office Visit Note   Patient: Rebekah Johnson           Date of Birth: Apr 08, 1960           MRN: [id number] Visit Date: 05/11/2016              Requested by: Arnoldo Morale, MD [place], [place] [id number] PCP: Arnoldo Morale, MD  Chief Complaint  Patient presents with  . Left Foot - Follow-up    09/06/15 left foot transmet amputation   . Right Foot - Follow-up    Small ulcer under 2nd MTH      HPI: Patient is a 56 year old woman who presents today for reevaluation of the right foot ulceration plantar aspect. Her left transmetatarsal amputation is well-healed. Denies any drainage. Has been in a Darco, is doing dressing changes daily to the RLE ulcer.  Assessment & Plan: Visit Diagnoses:  1. Diabetic ulcer of right midfoot associated with type 2 diabetes mellitus, unspecified ulcer stage ([place])     Plan: The patient will do daily wound cleansing. Apply antibacterial ointment and dressing. Continue the Darco shoe to offload the forefoot. Continue the Doxycycline. she'll follow-up in 2 weeks.  Follow-Up Instructions: Return in about 2 weeks (around 05/25/2016).   Ortho Exam  Patient is alert, oriented, no adenopathy, well-dressed, normal affect, normal respiratory effort. Left transmetatarsal amputation is well-healed. The right foot she has plantar ulceration beneath the second metatarsal head. This probes 6 mm deep. There is surrounding maceration. The ulceration is 3 mm in diameter. There is no drainage noted today no erythema. there is no sausage digit swelling. She does have thickened and discolored onychomycotic nails 5 right foot.   Imaging: Xr Foot 2 Views Right  Result Date: 05/11/2016 Radiographs of the right foot show no bony destruction. No definitive osteomyelitis.   Labs: Lab Results  Component Value Date   HGBA1C 7.4 02/25/2016   HGBA1C 6.1 10/23/2015   HGBA1C 7.5 07/19/2015   ESRSEDRATE 57 (H) 07/13/2015   CRP 0.7 07/13/2015   LABURIC 5.7  07/13/2015   REPTSTATUS 07/18/2015 FINAL 07/13/2015   CULT NO GROWTH 5 DAYS 07/13/2015    Orders:  Orders Placed This Encounter  Procedures  . XR Foot 2 Views Right   No orders of the defined types were placed in this encounter.    Procedures: No procedures performed  Clinical Data: No additional findings.  ROS:  All other systems negative, except as noted in the HPI. Review of Systems  Constitutional: Negative for chills and fever.  Skin: Positive for wound.    Objective: Vital Signs: Ht 5\' 4"  (1.626 m)   Wt 194 lb (88 kg)   BMI 33.30 kg/m   Specialty Comments:  No specialty comments available.  PMFS History: Patient Active Problem List   Diagnosis Date Noted  . Onychomycosis 12/24/2015  . Status post transmetatarsal amputation of foot, left ([place]) 09/06/2015  . Diabetic foot ulcer ([facility]) 07/13/2015  . AKI (acute kidney injury) ([facility]) 07/13/2015  . Cellulitis 07/12/2015  . HLD (hyperlipidemia) 09/02/2012  . Peripheral nerve disease 09/02/2012  . Type 2 diabetes mellitus with peripheral angiopathy ([facility]) 09/02/2012  . Hypertriglyceridemia 07/28/2012  . Peripheral vascular disease ([facility] [facility]) 07/28/2012  . Avitaminosis D 07/28/2012  . Essential (primary) hypertension 06/10/2012  . History of biliary T-tube placement 06/10/2012  . Leg pain 06/10/2012  . Current tobacco use 06/10/2012  . Atrophic vaginitis 06/07/2012  . Type 2 diabetes mellitus ([facility]) 06/07/2012   Past Medical History:  Diagnosis Date  . Diabetes mellitus without complication ([facility])   . High cholesterol   . Hypertension     Family History  Problem Relation Age of Onset  . Diabetes Mother   . Hypertension Father     Past Surgical History:  Procedure Laterality Date  . ABDOMINAL HYSTERECTOMY    . AMPUTATION Left 09/06/2015   Procedure: LEFT TRANSMETATARSAL AMPUTATTION;  Surgeon: Newt Minion, MD;  Location: [facility];  Service: Orthopedics;  Laterality: Left;  . PERIPHERAL VASCULAR CATHETERIZATION  N/A 08/16/2015   Procedure: Abdominal Aortogram;  Surgeon: Elam Dutch, MD;  Location: [facility];  Service: Cardiovascular;  Laterality: N/A;  . PERIPHERAL VASCULAR CATHETERIZATION Bilateral 08/16/2015   Procedure: Lower Extremity Angiography;  Surgeon: Elam Dutch, MD;  Location: [facility];  Service: Cardiovascular;  Laterality: Bilateral;  . PERIPHERAL VASCULAR CATHETERIZATION Left 08/16/2015   Procedure: Peripheral Vascular Intervention;  Surgeon: Elam Dutch, MD;  Location: [facility];  Service: Cardiovascular;  Laterality: Left;  SFA STENT X 2   Social History   Occupational History  . Not on file.   Social History Main Topics  . Smoking status: Current Every Day Smoker    Packs/day: 0.25    Types: Cigarettes  . Smokeless tobacco: Never Used     Comment: 3 daily  . Alcohol use 0.6 - 1.2 oz/week    1 - 2 Glasses of wine per week     Comment: on social occassions  . Drug use: No  . Sexual activity: Yes    Partners: Male

## 2016-05-11 NOTE — Telephone Encounter (Signed)
-----   Message from Arnoldo Morale, MD sent at 05/11/2016  8:49 AM EDT ----- Kidney function shows a slight decline.Please advise to work on better blood pressure and glycemic control.

## 2016-05-11 NOTE — Telephone Encounter (Signed)
Patient verified DOB Patient is aware of a slight decline being noted in her kidney function. Patient advised to control her blood sugar and pressure with taking medications as prescribed.  Patient expressed her understanding and had no further questions at this time.

## 2016-05-15 ENCOUNTER — Telehealth: Payer: Self-pay | Admitting: Family Medicine

## 2016-05-15 DIAGNOSIS — E1149 Type 2 diabetes mellitus with other diabetic neurological complication: Secondary | ICD-10-CM

## 2016-05-15 MED ORDER — SITAGLIPTIN PHOSPHATE 50 MG PO TABS: 50.0000 mg | ORAL_TABLET | Freq: Every day | ORAL | 0 refills | Status: DC

## 2016-05-15 MED ORDER — DOCUSATE SODIUM 100 MG PO CAPS: 100.0000 mg | ORAL_CAPSULE | Freq: Two times a day (BID) | ORAL | 0 refills | Status: DC

## 2016-05-15 NOTE — Telephone Encounter (Signed)
Pt. Called requesting a refill on Januvia and her stool softener. Pt. Would like Rx sent to [facility] in [facility].  Please f/u.

## 2016-05-15 NOTE — Telephone Encounter (Signed)
Prescription for Colace has been sent to her pharmacy.

## 2016-05-15 NOTE — Telephone Encounter (Signed)
Januvia refilled - I don't see a current prescription for stool softener, will forward to Dr. Jarold Song.

## 2016-05-20 ENCOUNTER — Ambulatory Visit (HOSPITAL_COMMUNITY)
Admission: RE | Admit: 2016-05-20 | Discharge: 2016-05-20 | Disposition: A | Discharge status: Home / Self Care | Payer: Medicaid Other | Source: Ambulatory Visit | Attending: Vascular Surgery | Admitting: Vascular Surgery

## 2016-05-20 ENCOUNTER — Ambulatory Visit (INDEPENDENT_AMBULATORY_CARE_PROVIDER_SITE_OTHER)
Admission: RE | Admit: 2016-05-20 | Discharge: 2016-05-20 | Disposition: A | Discharge status: Home / Self Care | Payer: Medicaid Other | Source: Ambulatory Visit | Attending: Vascular Surgery | Admitting: Vascular Surgery

## 2016-05-20 DIAGNOSIS — I739 Peripheral vascular disease, unspecified: Secondary | ICD-10-CM

## 2016-05-25 ENCOUNTER — Ambulatory Visit (INDEPENDENT_AMBULATORY_CARE_PROVIDER_SITE_OTHER): Payer: Medicaid Other | Admitting: Orthopedic Surgery

## 2016-05-27 ENCOUNTER — Ambulatory Visit (INDEPENDENT_AMBULATORY_CARE_PROVIDER_SITE_OTHER): Payer: Medicaid Other | Admitting: Vascular Surgery

## 2016-05-27 ENCOUNTER — Encounter: Payer: Self-pay | Admitting: Vascular Surgery

## 2016-05-27 VITALS — BP 208/88 | HR 60 | Temp 97.5°F | Resp 20 | Ht 64.0 in | Wt 199.0 lb

## 2016-05-27 DIAGNOSIS — I70219 Atherosclerosis of native arteries of extremities with intermittent claudication, unspecified extremity: Secondary | ICD-10-CM

## 2016-05-27 NOTE — Progress Notes (Signed)
Patient name: Rebekah Johnson MRN: [id number] DOB: 01-23-1960 Sex: female  REASON FOR VISIT:    Follow up of peripheral vascular disease.  HPI:   Rebekah Johnson is a 56 y.o. female who I last saw on 04/15/2016. She had presented with a left foot wound. 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 She underwent arteriography on 08/16/2015 and had occlusion of the distal superficial femoral artery and popliteal artery. This was successfully ballooned and stented with no residual stenosis. She also had angioplasty of the proximal left superficial femoral artery. This was all by Dr. Oneida Alar with an excellent result. I had recommended a duplex scan and ABIs but this was denied by Medicare. She comes in to discuss these results that have now been obtained.  Her transmetatarsal amputation site on the left is healed in. She has been ambulating and describes some claudication bilaterally more significantly on the left side. She denies any rest pain.  She smokes a pack of cigarettes in 1-1/2 days.  Past Medical History:  Diagnosis Date  . Diabetes mellitus without complication ([facility])   . High cholesterol   . Hypertension     Family History  Problem Relation Age of Onset  . Diabetes Mother   . Hypertension Father     SOCIAL HISTORY: Social History  Substance Use Topics  . Smoking status: Current Every Day Smoker    Packs/day: 0.25    Types: Cigarettes  . Smokeless tobacco: Never Used     Comment: 1 pk last 1 1/2 days  . Alcohol use 0.6 - 1.2 oz/week    1 - 2 Glasses of wine per week     Comment: on social occassions    No Known Allergies  Current Outpatient Prescriptions  Medication Sig Dispense Refill  . amLODipine (NORVASC) 10 MG tablet Take 1 tablet (10 mg total) by mouth daily. Reported on 07/13/2015 30 tablet 3  . aspirin 81 MG EC tablet Take 1 tablet (81 mg total) by mouth daily. Reported on 07/13/2015 30 tablet 3  . atorvastatin (LIPITOR) 40 MG  tablet Take 1 tablet (40 mg total) by mouth daily. Reported on 07/13/2015 30 tablet 3  . blood glucose meter kit and supplies KIT Dispense based on patient and insurance preference. Use up to four times daily as directed. (FOR ICD-9 250.00, 250.01). 1 each 3  . Blood Glucose Monitoring Suppl (ACCU-CHEK AVIVA) device Use as instructed three times daily. 1 each 0  . carvedilol (COREG) 12.5 MG tablet Take 1 tablet (12.5 mg total) by mouth 2 (two) times daily with a meal. 60 tablet 3  . Cholecalciferol (VITAMIN D-3) 1000 units CAPS Take 1 capsule by mouth daily.    . clopidogrel (PLAVIX) 75 MG tablet Take 1 tablet (75 mg total) by mouth daily. 30 tablet 3  . docusate sodium (COLACE) 100 MG capsule Take 1 capsule (100 mg total) by mouth 2 (two) times daily. 60 capsule 0  . doxycycline (VIBRA-TABS) 100 MG tablet Take 1 tablet (100 mg total) by mouth 2 (two) times daily. 60 tablet 0  . gabapentin (NEURONTIN) 300 MG capsule Take 2 capsules (600 mg total) by mouth 3 (three) times daily as needed (neuropathic pain). 180 capsule 3  . glipiZIDE (GLUCOTROL) 10 MG tablet Take 1 tablet (10 mg total) by mouth 2 (two) times daily before a meal. 60 tablet 3  . glucose blood (ACCU-CHEK AVIVA) test strip Use as instructed three times daily before meals. 100 each 12  .  isosorbide mononitrate (IMDUR) 60 MG 24 hr tablet Take 1 tablet (60 mg total) by mouth daily. 30 tablet 3  . Lancet Devices (ACCU-CHEK SOFTCLIX) lancets Use as instructed three times daily before meals. 1 each 5  . lisinopril (PRINIVIL,ZESTRIL) 20 MG tablet Take 0.5 tablets (10 mg total) by mouth daily. 30 tablet 3  . mupirocin ointment (BACTROBAN) 2 % Apply 1 application topically 2 (two) times daily. 22 g 6  . Omega-3 Fatty Acids (FISH OIL) 1000 MG CAPS Take 1 capsule by mouth daily. Reported on 07/13/2015    . silver sulfADIAZINE (SILVADENE) 1 % cream Apply 1 application topically daily. 50 g 0  . sitaGLIPtin (JANUVIA) 50 MG tablet Take 1 tablet (50 mg  total) by mouth daily. 90 tablet 0   No current facility-administered medications for this visit.     REVIEW OF SYSTEMS:  '[X]'  denotes positive finding, '[ ]'  denotes negative finding Cardiac  Comments:  Chest pain or chest pressure:    Shortness of breath upon exertion:    Short of breath when lying flat:    Irregular heart rhythm:        Vascular    Pain in calf, thigh, or hip brought on by ambulation: X Bilateral   Pain in feet at night that wakes you up from your sleep:     Blood clot in your veins:    Leg swelling:         Pulmonary    Oxygen at home:    Productive cough:     Wheezing:         Neurologic    Sudden weakness in arms or legs:     Sudden numbness in arms or legs:     Sudden onset of difficulty speaking or slurred speech:    Temporary loss of vision in one eye:     Problems with dizziness:         Gastrointestinal    Blood in stool:     Vomited blood:         Genitourinary    Burning when urinating:     Blood in urine:        Psychiatric    Major depression:         Hematologic    Bleeding problems:    Problems with blood clotting too easily:        Skin    Rashes or ulcers:        Constitutional    Fever or chills:     PHYSICAL EXAM:   Vitals:   05/27/16 1157  BP: (!) 208/88  Pulse: 60  Resp: 20  Temp: 97.5 F (36.4 C)  TempSrc: Oral  SpO2: 98%  Weight: 199 lb (90.3 kg)  Height: '5\' 4"'  (6.045 m)    GENERAL: The patient is a well-nourished female, in no acute distress. The vital signs are documented above. CARDIAC: There is a regular rate and rhythm.  VASCULAR: I do not detect carotid bruits. She has palpable femoral pulses. I cannot palpate pedal pulses. PULMONARY: There is good air exchange bilaterally without wheezing or rales. ABDOMEN: Soft and non-tender with normal pitched bowel sounds.  MUSCULOSKELETAL: She has a transmetatarsal amputation on the left foot. NEUROLOGIC: No focal weakness or paresthesias are detected. SKIN:  There are no ulcers or rashes noted. PSYCHIATRIC: The patient has a normal affect.  DATA:    LOWER EXTREMITY ARTERIAL DOPPLER STUDY: I have reviewed her lower extremity arterial Doppler study that was done  on 05/20/2016. On the left side, which is the side of the intervention, she has monophasic Doppler signals in the posterior tibial and dorsalis pedis positions. ABI on the left is 48%. On the right side, there are monophasic Doppler signals in the foot with an ABI of 57%.  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 This may be some recurrent stenosis within the stent. However even that this is occurred over a fairly quick time. And given that her symptoms are tolerable I would not recommend aggressively pursuing this.  MEDICAL ISSUES:   PERIPHERAL VASCULAR DISEASE: She does have some recurrent stenosis where she's had the angioplasty on the left but I would not recommend aggressive approach to this given that this occurred fairly quickly. She continues to smoke and we have discussed the importance of tobacco cessation. I also encouraged her to stay as active as possible. I'll plan on seeing her back in 6 months with ABIs and duplex of the left lower extremity to follow up her angioplasty on the left. She knows to call sooner if she has problems.  Deitra Mayo [facility] [phone]

## 2016-06-02 ENCOUNTER — Ambulatory Visit: Payer: Medicaid Other | Attending: Family Medicine | Admitting: Family Medicine

## 2016-06-02 ENCOUNTER — Encounter: Payer: Self-pay | Admitting: Family Medicine

## 2016-06-02 VITALS — BP 158/70 | HR 74 | Temp 98.0°F | Resp 18 | Ht 64.0 in | Wt 202.4 lb

## 2016-06-02 DIAGNOSIS — E1151 Type 2 diabetes mellitus with diabetic peripheral angiopathy without gangrene: Secondary | ICD-10-CM | POA: Diagnosis not present

## 2016-06-02 DIAGNOSIS — Z79899 Other long term (current) drug therapy: Secondary | ICD-10-CM | POA: Diagnosis not present

## 2016-06-02 DIAGNOSIS — E1140 Type 2 diabetes mellitus with diabetic neuropathy, unspecified: Secondary | ICD-10-CM | POA: Insufficient documentation

## 2016-06-02 DIAGNOSIS — Z7982 Long term (current) use of aspirin: Secondary | ICD-10-CM | POA: Insufficient documentation

## 2016-06-02 DIAGNOSIS — L97401 Non-pressure chronic ulcer of unspecified heel and midfoot limited to breakdown of skin: Secondary | ICD-10-CM

## 2016-06-02 DIAGNOSIS — Z7984 Long term (current) use of oral hypoglycemic drugs: Secondary | ICD-10-CM | POA: Diagnosis not present

## 2016-06-02 DIAGNOSIS — I10 Essential (primary) hypertension: Secondary | ICD-10-CM | POA: Diagnosis not present

## 2016-06-02 DIAGNOSIS — Z7902 Long term (current) use of antithrombotics/antiplatelets: Secondary | ICD-10-CM | POA: Insufficient documentation

## 2016-06-02 DIAGNOSIS — E08621 Diabetes mellitus due to underlying condition with foot ulcer: Secondary | ICD-10-CM

## 2016-06-02 DIAGNOSIS — E7800 Pure hypercholesterolemia, unspecified: Secondary | ICD-10-CM | POA: Diagnosis not present

## 2016-06-02 DIAGNOSIS — E119 Type 2 diabetes mellitus without complications: Secondary | ICD-10-CM | POA: Diagnosis not present

## 2016-06-02 DIAGNOSIS — Z89422 Acquired absence of other left toe(s): Secondary | ICD-10-CM | POA: Diagnosis not present

## 2016-06-02 DIAGNOSIS — Z90710 Acquired absence of both cervix and uterus: Secondary | ICD-10-CM | POA: Diagnosis not present

## 2016-06-02 LAB — GLUCOSE, POCT (MANUAL RESULT ENTRY): POC Glucose: 163 mg/dl — AB (ref 70–99)

## 2016-06-02 LAB — POCT GLYCOSYLATED HEMOGLOBIN (HGB A1C): Hemoglobin A1C: 8

## 2016-06-02 MED ORDER — CARVEDILOL 25 MG PO TABS: 25.0000 mg | ORAL_TABLET | Freq: Two times a day (BID) | ORAL | 3 refills | Status: DC

## 2016-06-02 NOTE — Progress Notes (Signed)
Subjective:  Patient ID: Rebekah Johnson, female    DOB: 1960/05/02  Age: 56 y.o. MRN: [id number]  CC: Follow-up on hypertension  HPI Rebekah Johnson is a 56 year old female with a history of type 2 diabetes mellitus (A1c 7.4), diabetic neuropathy, peripheral vascular disease (status post left SFA and popliteal stent and left SFA PTA with drug coated balloon in 08/2015), Left foot transmetatarsal amputation,  tobacco abuse, hypertension who presents today for follow-up of her hypertension  She Is needing completion of paperwork for clearance for dental procedure as she had presented for dental work and was found to have severely elevated blood pressure and she admits to not taking her antihypertensives prior to that visit. Lisinopril was increased at her last visit and today her blood pressure is has improved from her last visit but is slightly elevated despite taking her antihypertensives. She endorses being under a lot of stress.  Past Medical History:  Diagnosis Date  . Diabetes mellitus without complication ([place])   . High cholesterol   . Hypertension     Past Surgical History:  Procedure Laterality Date  . ABDOMINAL HYSTERECTOMY    . AMPUTATION Left 09/06/2015   Procedure: LEFT TRANSMETATARSAL AMPUTATTION;  Surgeon: Newt Minion, MD;  Location: [facility];  Service: Orthopedics;  Laterality: Left;  . PERIPHERAL VASCULAR CATHETERIZATION N/A 08/16/2015   Procedure: Abdominal Aortogram;  Surgeon: Elam Dutch, MD;  Location: [place] [facility];  Service: Cardiovascular;  Laterality: N/A;  . PERIPHERAL VASCULAR CATHETERIZATION Bilateral 08/16/2015   Procedure: Lower Extremity Angiography;  Surgeon: Elam Dutch, MD;  Location: [facility] [facility];  Service: Cardiovascular;  Laterality: Bilateral;  . PERIPHERAL VASCULAR CATHETERIZATION Left 08/16/2015   Procedure: Peripheral Vascular Intervention;  Surgeon: Elam Dutch, MD;  Location: [place] [facility];  Service: Cardiovascular;   Laterality: Left;  SFA STENT X 2    No Known Allergies   Outpatient Medications Prior to Visit  Medication Sig Dispense Refill  . amLODipine (NORVASC) 10 MG tablet Take 1 tablet (10 mg total) by mouth daily. Reported on 07/13/2015 30 tablet 3  . aspirin 81 MG EC tablet Take 1 tablet (81 mg total) by mouth daily. Reported on 07/13/2015 30 tablet 3  . atorvastatin (LIPITOR) 40 MG tablet Take 1 tablet (40 mg total) by mouth daily. Reported on 07/13/2015 30 tablet 3  . blood glucose meter kit and supplies KIT Dispense based on patient and insurance preference. Use up to four times daily as directed. (FOR ICD-9 250.00, 250.01). 1 each 3  . Blood Glucose Monitoring Suppl (ACCU-CHEK AVIVA) device Use as instructed three times daily. 1 each 0  . Cholecalciferol (VITAMIN D-3) 1000 units CAPS Take 1 capsule by mouth daily.    . clopidogrel (PLAVIX) 75 MG tablet Take 1 tablet (75 mg total) by mouth daily. 30 tablet 3  . docusate sodium (COLACE) 100 MG capsule Take 1 capsule (100 mg total) by mouth 2 (two) times daily. 60 capsule 0  . doxycycline (VIBRA-TABS) 100 MG tablet Take 1 tablet (100 mg total) by mouth 2 (two) times daily. 60 tablet 0  . gabapentin (NEURONTIN) 300 MG capsule Take 2 capsules (600 mg total) by mouth 3 (three) times daily as needed (neuropathic pain). 180 capsule 3  . glipiZIDE (GLUCOTROL) 10 MG tablet Take 1 tablet (10 mg total) by mouth 2 (two) times daily before a meal. 60 tablet 3  . glucose blood (ACCU-CHEK AVIVA) test strip Use as instructed three times daily before meals.  100 each 12  . isosorbide mononitrate (IMDUR) 60 MG 24 hr tablet Take 1 tablet (60 mg total) by mouth daily. 30 tablet 3  . Lancet Devices (ACCU-CHEK SOFTCLIX) lancets Use as instructed three times daily before meals. 1 each 5  . lisinopril (PRINIVIL,ZESTRIL) 20 MG tablet Take 0.5 tablets (10 mg total) by mouth daily. 30 tablet 3  . mupirocin ointment (BACTROBAN) 2 % Apply 1 application topically 2 (two) times  daily. 22 g 6  . Omega-3 Fatty Acids (FISH OIL) 1000 MG CAPS Take 1 capsule by mouth daily. Reported on 07/13/2015    . silver sulfADIAZINE (SILVADENE) 1 % cream Apply 1 application topically daily. 50 g 0  . sitaGLIPtin (JANUVIA) 50 MG tablet Take 1 tablet (50 mg total) by mouth daily. 90 tablet 0  . carvedilol (COREG) 12.5 MG tablet Take 1 tablet (12.5 mg total) by mouth 2 (two) times daily with a meal. 60 tablet 3   No facility-administered medications prior to visit.     ROS Review of Systems  Constitutional: Negative for activity change, appetite change and fatigue.  HENT: Negative for congestion, sinus pressure and sore throat.   Eyes: Negative for visual disturbance.  Respiratory: Negative for cough, chest tightness, shortness of breath and wheezing.   Cardiovascular: Negative for chest pain and palpitations.  Gastrointestinal: Negative for abdominal distention, abdominal pain and constipation.  Endocrine: Negative for polydipsia.  Genitourinary: Negative for dysuria and frequency.  Musculoskeletal: Negative for arthralgias and back pain.  Skin: Negative for rash.  Neurological: Negative for tremors, light-headedness and numbness.  Hematological: Does not bruise/bleed easily.  Psychiatric/Behavioral: Negative for agitation and behavioral problems.    Objective:  BP (!) 158/70 (BP Location: Left Arm, Patient Position: Sitting, Cuff Size: Normal)   Pulse 74   Temp 98 F (36.7 C) (Oral)   Resp 18   Ht 5' 4" (1.626 m)   Wt 202 lb 6.4 oz (91.8 kg)   SpO2 99%   BMI 34.74 kg/m   BP/Weight 06/02/2016 05/27/2016 05/11/2016  Systolic BP 158 208 -  Diastolic BP 70 88 -  Wt. (Lbs) 202.4 199 194  BMI 34.74 34.16 33.3      Physical Exam  Constitutional: She is oriented to person, place, and time. She appears well-developed and well-nourished.  Cardiovascular: Normal rate, normal heart sounds and intact distal pulses.   No murmur heard. Pulmonary/Chest: Effort normal and breath  sounds normal. She has no wheezes. She has no rales. She exhibits no tenderness.  Abdominal: Soft. Bowel sounds are normal. She exhibits no distension and no mass. There is no tenderness.  Musculoskeletal: Normal range of motion.  Neurological: She is alert and oriented to person, place, and time.  Skin: Skin is warm and dry.  Psychiatric: She has a normal mood and affect.    Lab Results  Component Value Date   HGBA1C 8.0 06/02/2016    Assessment & Plan:   1. Type 2 diabetes mellitus A1c of 8.0 which has trended up from 7.4 previously Strongly emphasized compliance with diabetic diet and lifestyle modifications Regimen change at next visit if A1c trends up further - Glucose (CBG) - HgB A1c  2. Essential (primary) hypertension Uncontrolled and above goal of less than 130/80 Increased dose of carvedilol Low-sodium diet Completed clearance for surgery - optimized for moderate risk procedure. We'll need to hold Plavix for 5 days prior to procedure if extraction as needed. - carvedilol (COREG) 25 MG tablet; Take 1 tablet (25 mg total) by   mouth 2 (two) times daily with a meal.  Dispense: 60 tablet; Refill: 3   Meds ordered this encounter  Medications  . carvedilol (COREG) 25 MG tablet    Sig: Take 1 tablet (25 mg total) by mouth 2 (two) times daily with a meal.    Dispense:  60 tablet    Refill:  3    Discontinue her previous dose    Follow-up: Return in about 3 months (around 09/02/2016) for Follow-up on diabetes mellitus.   Arnoldo Morale MD

## 2016-06-02 NOTE — Progress Notes (Signed)
Patient is here for f/up  Patient has taking her medication  Patient has eaten for today   Patient denies pain for today

## 2016-06-02 NOTE — Patient Instructions (Signed)
Diabetes Mellitus and Exercise Exercising regularly is important for your overall health, especially when you have diabetes (diabetes mellitus). Exercising is not only about losing weight. It has many health benefits, such as increasing muscle strength and bone density and reducing body fat and stress. This leads to improved fitness, flexibility, and endurance, all of which result in better overall health. Exercise has additional benefits for people with diabetes, including:  Reducing appetite.  Helping to lower and control blood glucose.  Lowering blood pressure.  Helping to control amounts of fatty substances (lipids) in the blood, such as cholesterol and triglycerides.  Helping the body to respond better to insulin (improving insulin sensitivity).  Reducing how much insulin the body needs.  Decreasing the risk for heart disease by:  Lowering cholesterol and triglyceride levels.  Increasing the levels of good cholesterol.  Lowering blood glucose levels. What is my activity plan? Your health care provider or certified diabetes educator can help you make a plan for the type and frequency of exercise (activity plan) that works for you. Make sure that you:  Do at least 150 minutes of moderate-intensity or vigorous-intensity exercise each week. This could be brisk walking, biking, or water aerobics.  Do stretching and strength exercises, such as yoga or weightlifting, at least 2 times a week.  Spread out your activity over at least 3 days of the week.  Get some form of physical activity every day.  Do not go more than 2 days in a row without some kind of physical activity.  Avoid being inactive for more than 90 minutes at a time. Take frequent breaks to walk or stretch.  Choose a type of exercise or activity that you enjoy, and set realistic goals.  Start slowly, and gradually increase the intensity of your exercise over time. What do I need to know about managing my  diabetes?  Check your blood glucose before and after exercising.  If your blood glucose is higher than 240 mg/dL (13.3 mmol/L) before you exercise, check your urine for ketones. If you have ketones in your urine, do not exercise until your blood glucose returns to normal.  Know the symptoms of low blood glucose (hypoglycemia) and how to treat it. Your risk for hypoglycemia increases during and after exercise. Common symptoms of hypoglycemia can include:  Hunger.  Anxiety.  Sweating and feeling clammy.  Confusion.  Dizziness or feeling light-headed.  Increased heart rate or palpitations.  Blurry vision.  Tingling or numbness around the mouth, lips, or tongue.  Tremors or shakes.  Irritability.  Keep a rapid-acting carbohydrate snack available before, during, and after exercise to help prevent or treat hypoglycemia.  Avoid injecting insulin into areas of the body that are going to be exercised. For example, avoid injecting insulin into:  The arms, when playing tennis.  The legs, when jogging.  Keep records of your exercise habits. Doing this can help you and your health care provider adjust your diabetes management plan as needed. Write down:  Food that you eat before and after you exercise.  Blood glucose levels before and after you exercise.  The type and amount of exercise you have done.  When your insulin is expected to peak, if you use insulin. Avoid exercising at times when your insulin is peaking.  When you start a new exercise or activity, work with your health care provider to make sure the activity is safe for you, and to adjust your insulin, medicines, or food intake as needed.  Drink plenty   of water while you exercise to prevent dehydration or heat stroke. Drink enough fluid to keep your urine clear or pale yellow. This information is not intended to replace advice given to you by your health care provider. Make sure you discuss any questions you have with  your health care provider. Document Released: 03/14/2003 Document Revised: 07/12/2015 Document Reviewed: 06/03/2015 Elsevier Interactive Patient Education  2017 [employer]vier Inc.  

## 2016-06-03 ENCOUNTER — Ambulatory Visit (INDEPENDENT_AMBULATORY_CARE_PROVIDER_SITE_OTHER): Payer: Medicaid Other | Admitting: Orthopedic Surgery

## 2016-06-11 ENCOUNTER — Ambulatory Visit (INDEPENDENT_AMBULATORY_CARE_PROVIDER_SITE_OTHER): Payer: Medicaid Other | Admitting: Orthopedic Surgery

## 2016-06-30 ENCOUNTER — Other Ambulatory Visit: Payer: Self-pay | Admitting: Family Medicine

## 2016-07-02 ENCOUNTER — Other Ambulatory Visit: Payer: Self-pay | Admitting: Pharmacist

## 2016-07-02 DIAGNOSIS — I10 Essential (primary) hypertension: Secondary | ICD-10-CM

## 2016-07-02 MED ORDER — ASPIRIN 81 MG PO TBEC: 81.0000 mg | DELAYED_RELEASE_TABLET | Freq: Every day | ORAL | 0 refills | Status: DC

## 2016-08-04 ENCOUNTER — Telehealth (INDEPENDENT_AMBULATORY_CARE_PROVIDER_SITE_OTHER): Payer: Self-pay | Admitting: Radiology

## 2016-08-04 NOTE — Telephone Encounter (Signed)
Patient has an appointment tomorrow at 1:00pm. She has some disability paperwork she dropped off approximately a month ago. She states the form is paid for and would like to pick up at her office visit.

## 2016-08-05 ENCOUNTER — Encounter (INDEPENDENT_AMBULATORY_CARE_PROVIDER_SITE_OTHER): Payer: Self-pay | Admitting: Family

## 2016-08-05 ENCOUNTER — Telehealth (INDEPENDENT_AMBULATORY_CARE_PROVIDER_SITE_OTHER): Payer: Self-pay | Admitting: *Deleted

## 2016-08-05 ENCOUNTER — Ambulatory Visit (INDEPENDENT_AMBULATORY_CARE_PROVIDER_SITE_OTHER): Payer: Medicaid Other | Admitting: Family

## 2016-08-05 VITALS — Ht 64.0 in | Wt 202.0 lb

## 2016-08-05 DIAGNOSIS — E1100 Type 2 diabetes mellitus with hyperosmolarity without nonketotic hyperglycemic-hyperosmolar coma (NKHHC): Secondary | ICD-10-CM

## 2016-08-05 DIAGNOSIS — Z89432 Acquired absence of left foot: Secondary | ICD-10-CM | POA: Diagnosis not present

## 2016-08-05 DIAGNOSIS — B351 Tinea unguium: Secondary | ICD-10-CM

## 2016-08-05 NOTE — Telephone Encounter (Signed)
Good morning,  I do not have it ready. She needs to sign an authorization. Everytime I called her I got voicemail. I left voicemails. I mailed a 2nd packet to her with one in it. She never sent it back.  I can have it ready by the time she leaves, but she has to sign one first.

## 2016-08-05 NOTE — Progress Notes (Signed)
Office Visit Note   Patient: Rebekah Johnson           Date of Birth: Nov 25, 1960           MRN: [id number] Visit Date: 08/05/2016              Requested by: Arnoldo Morale, MD [place], [place] [id number] PCP: Arnoldo Morale, MD  Chief Complaint  Patient presents with  . Left Foot - Follow-up    09/06/15 left foot transmet amputation   . Right Foot - Nail Problem      HPI: Patient is a 56 year old woman who presents today for reevaluation of the right foot. Her left transmetatarsal amputation is well-healed. Denies any drainage. Has been in regular shoe wear. Has been doing well. 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  Assessment & Plan: Visit Diagnoses:  1. Status post transmetatarsal amputation of foot, left ([place])   2. Onychomycosis   3. Type 2 diabetes mellitus with hyperosmolarity without coma, without long-term current use of insulin ([place])     Plan: The patient will continue foot checks. Activities as tolerated. Nails trimmed without incident.  Follow-Up Instructions: Return in about 3 months (around 11/05/2016).   Ortho Exam  Patient is alert, oriented, no adenopathy, well-dressed, normal affect, normal respiratory effort. Left transmetatarsal amputation is well-healed. The right foot has no ulcers, no drainage. No erythema. She does have thickened and discolored onychomycotic nails 5 right foot. Is unable to safely trim own nails. Nails trimmed today without incident.  Imaging: No results found.  Labs: Lab Results  Component Value Date   HGBA1C 8.0 06/02/2016   HGBA1C 7.4 02/25/2016   HGBA1C 6.1 10/23/2015   ESRSEDRATE 57 (H) 07/13/2015   CRP 0.7 07/13/2015   LABURIC 5.7 07/13/2015   REPTSTATUS 07/18/2015 FINAL 07/13/2015   CULT NO GROWTH 5 DAYS 07/13/2015    Orders:  No orders of the defined types were placed in this encounter.  No orders of the defined types were placed in this encounter.    Procedures: No  procedures performed  Clinical Data: No additional findings.  ROS:  All other systems negative, except as noted in the HPI. Review of Systems  Constitutional: Negative for chills and fever.  Skin: Positive for wound.    Objective: Vital Signs: Ht 5\' 4"  (1.626 m)   Wt 202 lb (91.6 kg)   BMI 34.67 kg/m   Specialty Comments:  No specialty comments available.  PMFS History: Patient Active Problem List   Diagnosis Date Noted  . Onychomycosis 12/24/2015  . Status post transmetatarsal amputation of foot, left ([place]) 09/06/2015  . Diabetic foot ulcer ([place]) 07/13/2015  . AKI (acute kidney injury) ([place]) 07/13/2015  . Cellulitis 07/12/2015  . HLD (hyperlipidemia) 09/02/2012  . Peripheral nerve disease 09/02/2012  . Type 2 diabetes mellitus with peripheral angiopathy ([place]) 09/02/2012  . Hypertriglyceridemia 07/28/2012  . Peripheral vascular disease ([place]) 07/28/2012  . Avitaminosis D 07/28/2012  . Essential (primary) hypertension 06/10/2012  . History of biliary T-tube placement 06/10/2012  . Leg pain 06/10/2012  . Current tobacco use 06/10/2012  . Atrophic vaginitis 06/07/2012  . Type 2 diabetes mellitus ([place]) 06/07/2012   Past Medical History:  Diagnosis Date  . Diabetes mellitus without complication ([place])   . High cholesterol   . Hypertension     Family History  Problem Relation Age of Onset  . Diabetes Mother   . Hypertension Father     Past  Surgical History:  Procedure Laterality Date  . ABDOMINAL HYSTERECTOMY    . AMPUTATION Left 09/06/2015   Procedure: LEFT TRANSMETATARSAL AMPUTATTION;  Surgeon: Newt Minion, MD;  Location: [facility];  Service: Orthopedics;  Laterality: Left;  . PERIPHERAL VASCULAR CATHETERIZATION N/A 08/16/2015   Procedure: Abdominal Aortogram;  Surgeon: Elam Dutch, MD;  Location: [facility];  Service: Cardiovascular;  Laterality: N/A;  . PERIPHERAL VASCULAR CATHETERIZATION Bilateral 08/16/2015   Procedure: Lower Extremity Angiography;   Surgeon: Elam Dutch, MD;  Location: [facility];  Service: Cardiovascular;  Laterality: Bilateral;  . PERIPHERAL VASCULAR CATHETERIZATION Left 08/16/2015   Procedure: Peripheral Vascular Intervention;  Surgeon: Elam Dutch, MD;  Location: [facility];  Service: Cardiovascular;  Laterality: Left;  SFA STENT X 2   Social History   Occupational History  . Not on file.   Social History Main Topics  . Smoking status: Current Every Day Smoker    Packs/day: 0.25    Types: Cigarettes  . Smokeless tobacco: Never Used     Comment: 1 pk last 1 1/2 days  . Alcohol use 0.6 - 1.2 oz/week    1 - 2 Glasses of wine per week     Comment: on social occassions  . Drug use: No  . Sexual activity: Yes    Partners: Male

## 2016-08-05 NOTE — Addendum Note (Signed)
Addended by: Dondra Prader R on: 08/05/2016 01:18 PM   Modules accepted: Orders

## 2016-08-07 NOTE — Telephone Encounter (Signed)
Paperwork given back to [place] after Dr. Sharol Given signed.

## 2016-08-11 ENCOUNTER — Ambulatory Visit: Payer: Medicaid Other | Admitting: Family Medicine

## 2016-08-12 ENCOUNTER — Ambulatory Visit: Payer: Medicaid Other | Attending: Family Medicine | Admitting: Family Medicine

## 2016-08-12 ENCOUNTER — Encounter: Payer: Self-pay | Admitting: Family Medicine

## 2016-08-12 VITALS — BP 166/63 | HR 64 | Temp 98.1°F | Ht 64.0 in | Wt 196.2 lb

## 2016-08-12 DIAGNOSIS — Z89432 Acquired absence of left foot: Secondary | ICD-10-CM | POA: Diagnosis not present

## 2016-08-12 DIAGNOSIS — Z7902 Long term (current) use of antithrombotics/antiplatelets: Secondary | ICD-10-CM | POA: Insufficient documentation

## 2016-08-12 DIAGNOSIS — E7800 Pure hypercholesterolemia, unspecified: Secondary | ICD-10-CM | POA: Diagnosis not present

## 2016-08-12 DIAGNOSIS — I10 Essential (primary) hypertension: Secondary | ICD-10-CM | POA: Diagnosis not present

## 2016-08-12 DIAGNOSIS — E1100 Type 2 diabetes mellitus with hyperosmolarity without nonketotic hyperglycemic-hyperosmolar coma (NKHHC): Secondary | ICD-10-CM | POA: Diagnosis not present

## 2016-08-12 DIAGNOSIS — H5710 Ocular pain, unspecified eye: Secondary | ICD-10-CM | POA: Diagnosis present

## 2016-08-12 DIAGNOSIS — Z7984 Long term (current) use of oral hypoglycemic drugs: Secondary | ICD-10-CM | POA: Insufficient documentation

## 2016-08-12 DIAGNOSIS — Z79899 Other long term (current) drug therapy: Secondary | ICD-10-CM | POA: Diagnosis not present

## 2016-08-12 DIAGNOSIS — Z7982 Long term (current) use of aspirin: Secondary | ICD-10-CM | POA: Insufficient documentation

## 2016-08-12 DIAGNOSIS — H1133 Conjunctival hemorrhage, bilateral: Secondary | ICD-10-CM | POA: Insufficient documentation

## 2016-08-12 DIAGNOSIS — E1151 Type 2 diabetes mellitus with diabetic peripheral angiopathy without gangrene: Secondary | ICD-10-CM | POA: Insufficient documentation

## 2016-08-12 LAB — GLUCOSE, POCT (MANUAL RESULT ENTRY): POC GLUCOSE: 259 mg/dL — AB (ref 70–99)

## 2016-08-12 NOTE — Progress Notes (Signed)
Subjective:  Patient ID: Rebekah Johnson, female    DOB: 1960/11/25  Age: 56 y.o. MRN: [id number]  CC: Eye Pain   HPI Rebekah Johnson is a 56 year old female with a history of type 2 diabetes mellitus (A1c 7.4), diabetic neuropathy, peripheral vascular disease (status post left SFA and popliteal stent and left SFA PTA with drug coated balloon in 08/2015), Left foot transmetatarsal amputation,  tobacco abuse, hypertension who presents today complaining of intermittent spots of blood in both eyes and symptoms do not occur concurrently; duration has been over the last 2-3 weeks.  She denies associated eye pain, eye discharge or change in vision. He endorses a previous history of laser surgery in both eyes but denies symptoms at this time. She has not had a recent eye exam.  Past Medical History:  Diagnosis Date  . Diabetes mellitus without complication ([place])   . High cholesterol   . Hypertension     Past Surgical History:  Procedure Laterality Date  . ABDOMINAL HYSTERECTOMY    . AMPUTATION Left 09/06/2015   Procedure: LEFT TRANSMETATARSAL AMPUTATTION;  Surgeon: Newt Minion, MD;  Location: [facility];  Service: Orthopedics;  Laterality: Left;  . PERIPHERAL VASCULAR CATHETERIZATION N/A 08/16/2015   Procedure: Abdominal Aortogram;  Surgeon: Elam Dutch, MD;  Location: [facility];  Service: Cardiovascular;  Laterality: N/A;  . PERIPHERAL VASCULAR CATHETERIZATION Bilateral 08/16/2015   Procedure: Lower Extremity Angiography;  Surgeon: Elam Dutch, MD;  Location: [facility];  Service: Cardiovascular;  Laterality: Bilateral;  . PERIPHERAL VASCULAR CATHETERIZATION Left 08/16/2015   Procedure: Peripheral Vascular Intervention;  Surgeon: Elam Dutch, MD;  Location: [facility];  Service: Cardiovascular;  Laterality: Left;  SFA STENT X 2    No Known Allergies   Outpatient Medications Prior to Visit  Medication Sig Dispense Refill  . amLODipine (NORVASC) 10 MG  tablet Take 1 tablet (10 mg total) by mouth daily. Reported on 07/13/2015 30 tablet 3  . aspirin 81 MG EC tablet Take 1 tablet (81 mg total) by mouth daily. Reported on 07/13/2015 90 tablet 0  . atorvastatin (LIPITOR) 40 MG tablet Take 1 tablet (40 mg total) by mouth daily. Reported on 07/13/2015 30 tablet 3  . blood glucose meter kit and supplies KIT Dispense based on patient and insurance preference. Use up to four times daily as directed. (FOR ICD-9 250.00, 250.01). 1 each 3  . Blood Glucose Monitoring Suppl (ACCU-CHEK AVIVA) device Use as instructed three times daily. 1 each 0  . carvedilol (COREG) 25 MG tablet Take 1 tablet (25 mg total) by mouth 2 (two) times daily with a meal. 60 tablet 3  . Cholecalciferol (VITAMIN D-3) 1000 units CAPS Take 1 capsule by mouth daily.    . clopidogrel (PLAVIX) 75 MG tablet Take 1 tablet (75 mg total) by mouth daily. 30 tablet 3  . docusate sodium (COLACE) 100 MG capsule TAKE 1 CAPSULE BY MOUTH TWICE A DAY 60 capsule 0  . gabapentin (NEURONTIN) 300 MG capsule Take 2 capsules (600 mg total) by mouth 3 (three) times daily as needed (neuropathic pain). 180 capsule 3  . glipiZIDE (GLUCOTROL) 10 MG tablet Take 1 tablet (10 mg total) by mouth 2 (two) times daily before a meal. 60 tablet 3  . glucose blood (ACCU-CHEK AVIVA) test strip Use as instructed three times daily before meals. 100 each 12  . isosorbide mononitrate (IMDUR) 60 MG 24 hr tablet Take 1 tablet (60 mg total) by mouth  daily. 30 tablet 3  . Lancet Devices (ACCU-CHEK SOFTCLIX) lancets Use as instructed three times daily before meals. 1 each 5  . lisinopril (PRINIVIL,ZESTRIL) 20 MG tablet Take 0.5 tablets (10 mg total) by mouth daily. 30 tablet 3  . Omega-3 Fatty Acids (FISH OIL) 1000 MG CAPS Take 1 capsule by mouth daily. Reported on 07/13/2015    . sitaGLIPtin (JANUVIA) 50 MG tablet Take 1 tablet (50 mg total) by mouth daily. 90 tablet 0   No facility-administered medications prior to visit.     ROS Review  of Systems  Constitutional: Negative for activity change and appetite change.  HENT: Negative for sinus pressure and sore throat.   Eyes:       See hpi  Respiratory: Negative for chest tightness, shortness of breath and wheezing.   Cardiovascular: Negative for chest pain and palpitations.  Gastrointestinal: Negative for abdominal distention, abdominal pain and constipation.  Genitourinary: Negative.   Musculoskeletal: Negative.   Psychiatric/Behavioral: Negative for behavioral problems and dysphoric mood.    Objective:  BP (!) 166/63   Pulse 64   Temp 98.1 F (36.7 C) (Oral)   Ht '5\' 4"'  (1.626 m)   Wt 196 lb 3.2 oz (89 kg)   SpO2 99%   BMI 33.68 kg/m   BP/Weight 08/12/2016 08/05/2016 08/12/8108  Systolic BP 315 - 945  Diastolic BP 63 - 70  Wt. (Lbs) 196.2 202 202.4  BMI 33.68 34.67 34.74      Physical Exam  Constitutional: She is oriented to person, place, and time. She appears well-developed and well-nourished.  Eyes: Pupils are equal, round, and reactive to light.  No subconjunctival hemorrhage appreciated  Cardiovascular: Normal rate, normal heart sounds and intact distal pulses.   No murmur heard. Pulmonary/Chest: Effort normal and breath sounds normal. She has no wheezes. She has no rales. She exhibits no tenderness.  Abdominal: Soft. Bowel sounds are normal. She exhibits no distension and no mass. There is no tenderness.  Musculoskeletal: Normal range of motion.  Neurological: She is alert and oriented to person, place, and time.    Lab Results  Component Value Date   HGBA1C 8.0 06/02/2016    Assessment & Plan:   1. Type 2 diabetes mellitus with hyperosmolarity without coma, without long-term current use of insulin ([facility]) Uncontrolled with A1c of 8.0 She is due for A1c in the next 2 weeks and I will adjust her regimen if needed then. - POCT glucose (manual entry)  2. Subconjunctival hemorrhage of both eyes Visual acuity 20/40 bilaterally Advised that  subconjunctival hemorrhages self-limiting-80s absent at this time She will need to see ophthalmology to exclude diabetic retinopathy - Ambulatory referral to Ophthalmology  3. Essential (primary) hypertension Uncontrolled Reassess at next visit and adjust regimen   No orders of the defined types were placed in this encounter.  This note has been created with Surveyor, quantity. Any transcriptional errors are unintentional.     Follow-up: Return for follow up of medical conditions, keep previously scheduled appointment.   Arnoldo Morale MD

## 2016-08-12 NOTE — Patient Instructions (Signed)

## 2016-08-26 ENCOUNTER — Other Ambulatory Visit: Payer: Self-pay | Admitting: Family Medicine

## 2016-09-02 ENCOUNTER — Ambulatory Visit: Payer: Medicaid Other | Attending: Family Medicine | Admitting: Family Medicine

## 2016-09-02 ENCOUNTER — Encounter: Payer: Self-pay | Admitting: Family Medicine

## 2016-09-02 VITALS — BP 182/77 | HR 78 | Temp 98.4°F | Ht 64.0 in | Wt 204.8 lb

## 2016-09-02 DIAGNOSIS — E1149 Type 2 diabetes mellitus with other diabetic neurological complication: Secondary | ICD-10-CM

## 2016-09-02 DIAGNOSIS — Z7982 Long term (current) use of aspirin: Secondary | ICD-10-CM | POA: Insufficient documentation

## 2016-09-02 DIAGNOSIS — I739 Peripheral vascular disease, unspecified: Secondary | ICD-10-CM

## 2016-09-02 DIAGNOSIS — Z79899 Other long term (current) drug therapy: Secondary | ICD-10-CM | POA: Insufficient documentation

## 2016-09-02 DIAGNOSIS — E7800 Pure hypercholesterolemia, unspecified: Secondary | ICD-10-CM | POA: Diagnosis not present

## 2016-09-02 DIAGNOSIS — E1100 Type 2 diabetes mellitus with hyperosmolarity without nonketotic hyperglycemic-hyperosmolar coma (NKHHC): Secondary | ICD-10-CM | POA: Insufficient documentation

## 2016-09-02 DIAGNOSIS — E1140 Type 2 diabetes mellitus with diabetic neuropathy, unspecified: Secondary | ICD-10-CM | POA: Insufficient documentation

## 2016-09-02 DIAGNOSIS — Z90710 Acquired absence of both cervix and uterus: Secondary | ICD-10-CM | POA: Insufficient documentation

## 2016-09-02 DIAGNOSIS — I10 Essential (primary) hypertension: Secondary | ICD-10-CM | POA: Insufficient documentation

## 2016-09-02 DIAGNOSIS — Z7902 Long term (current) use of antithrombotics/antiplatelets: Secondary | ICD-10-CM | POA: Diagnosis not present

## 2016-09-02 DIAGNOSIS — E1151 Type 2 diabetes mellitus with diabetic peripheral angiopathy without gangrene: Secondary | ICD-10-CM | POA: Diagnosis not present

## 2016-09-02 DIAGNOSIS — Z7984 Long term (current) use of oral hypoglycemic drugs: Secondary | ICD-10-CM | POA: Insufficient documentation

## 2016-09-02 DIAGNOSIS — Z9889 Other specified postprocedural states: Secondary | ICD-10-CM | POA: Diagnosis not present

## 2016-09-02 LAB — POCT GLYCOSYLATED HEMOGLOBIN (HGB A1C): Hemoglobin A1C: 9.4

## 2016-09-02 LAB — GLUCOSE, POCT (MANUAL RESULT ENTRY): POC Glucose: 267 mg/dl — AB (ref 70–99)

## 2016-09-02 MED ORDER — CARVEDILOL 25 MG PO TABS: 25.0000 mg | ORAL_TABLET | Freq: Two times a day (BID) | ORAL | 3 refills | Status: DC

## 2016-09-02 MED ORDER — GLIPIZIDE 10 MG PO TABS: 10.0000 mg | ORAL_TABLET | Freq: Two times a day (BID) | ORAL | 3 refills | Status: DC

## 2016-09-02 MED ORDER — AMLODIPINE BESYLATE 10 MG PO TABS: 10.0000 mg | ORAL_TABLET | Freq: Every day | ORAL | 3 refills | Status: DC

## 2016-09-02 MED ORDER — LISINOPRIL 20 MG PO TABS: 20.0000 mg | ORAL_TABLET | Freq: Every day | ORAL | 3 refills | Status: DC

## 2016-09-02 MED ORDER — CLOPIDOGREL BISULFATE 75 MG PO TABS: 75.0000 mg | ORAL_TABLET | Freq: Every day | ORAL | 3 refills | Status: DC

## 2016-09-02 MED ORDER — ISOSORBIDE MONONITRATE ER 60 MG PO TB24: 60.0000 mg | ORAL_TABLET | Freq: Every day | ORAL | 3 refills | Status: DC

## 2016-09-02 MED ORDER — ATORVASTATIN CALCIUM 40 MG PO TABS: 40.0000 mg | ORAL_TABLET | Freq: Every day | ORAL | 3 refills | Status: DC

## 2016-09-02 MED ORDER — SITAGLIPTIN PHOSPHATE 50 MG PO TABS: 50.0000 mg | ORAL_TABLET | Freq: Every day | ORAL | 3 refills | Status: DC

## 2016-09-02 MED ORDER — GABAPENTIN 300 MG PO CAPS: 600.0000 mg | ORAL_CAPSULE | Freq: Three times a day (TID) | ORAL | 3 refills | Status: DC | PRN

## 2016-09-02 NOTE — Patient Instructions (Signed)
Diabetes Mellitus and Standards of Medical Care Managing diabetes (diabetes mellitus) can be complicated. Your diabetes treatment may be managed by a team of health care providers, including:  A diet and nutrition specialist (registered dietitian).  A nurse.  A certified diabetes educator (CDE).  A diabetes specialist (endocrinologist).  An eye doctor.  A primary care provider.  A dentist.  Your health care providers follow a schedule in order to help you get the best quality of care. The following schedule is a general guideline for your diabetes management plan. Your health care providers may also give you more specific instructions. HbA1c ( hemoglobin A1c) test This test provides information about blood sugar (glucose) control over the previous 2-3 months. It is used to check whether your diabetes management plan needs to be adjusted.  If you are meeting your treatment goals, this test is done at least 2 times a year.  If you are not meeting treatment goals or if your treatment goals have changed, this test is done 4 times a year.  Blood pressure test  This test is done at every routine medical visit. For most people, the goal is less than 130/80. Ask your health care provider what your goal blood pressure should be. Dental and eye exams  Visit your dentist two times a year.  If you have type 1 diabetes, get an eye exam 3-5 years after you are diagnosed, and then once a year after your first exam. ? If you were diagnosed with type 1 diabetes as a child, get an eye exam when you are age 16 or older and have had diabetes for 3-5 years. After the first exam, you should get an eye exam once a year.  If you have type 2 diabetes, have an eye exam as soon as you are diagnosed, and then once a year after your first exam. Foot care exam  Visual foot exams are done at every routine medical visit. The exams check for cuts, bruises, redness, blisters, sores, or other problems with the  feet.  A complete foot exam is done by your health care provider once a year. This exam includes an inspection of the structure and skin of your feet, and a check of the pulses and sensation in your feet. ? Type 1 diabetes: Get your first exam 3-5 years after diagnosis. ? Type 2 diabetes: Get your first exam as soon as you are diagnosed.  Check your feet every day for cuts, bruises, redness, blisters, or sores. If you have any of these or other problems that are not healing, contact your health care provider. Kidney function test ( urine microalbumin)  This test is done once a year. ? Type 1 diabetes: Get your first test 5 years after diagnosis. ? Type 2 diabetes: Get your first test as soon as you are diagnosed.  If you have chronic kidney disease (CKD), get a serum creatinine and estimated glomerular filtration rate (eGFR) test once a year. Lipid profile (cholesterol, HDL, LDL, triglycerides)  This test should be done when you are diagnosed with diabetes, and every 5 years after the first test. If you are on medicines to lower your cholesterol, you may need to get this test done every year. ? The goal for LDL is less than 100 mg/dL (5.5 mmol/L). If you are at high risk, the goal is less than 70 mg/dL (3.9 mmol/L). ? The goal for HDL is 40 mg/dL (2.2 mmol/L) for men and 50 mg/dL(2.8 mmol/L) for women. An HDL  cholesterol of 60 mg/dL (3.3 mmol/L) or higher gives some protection against heart disease. ? The goal for triglycerides is less than 150 mg/dL (8.3 mmol/L). Immunizations  The yearly flu (influenza) vaccine is recommended for everyone 6 months or older who has diabetes.  The pneumonia (pneumococcal) vaccine is recommended for everyone 2 years or older who has diabetes. If you are 97 or older, you may get the pneumonia vaccine as a series of two separate shots.  The hepatitis B vaccine is recommended for adults shortly after they have been diagnosed with diabetes.  The Tdap  (tetanus, diphtheria, and pertussis) vaccine should be given: ? According to normal childhood vaccination schedules, for children. ? Every 10 years, for adults who have diabetes.  The shingles vaccine is recommended for people who have had chicken pox and are 50 years or older. Mental and emotional health  Screening for symptoms of eating disorders, anxiety, and depression is recommended at the time of diagnosis and afterward as needed. If your screening shows that you have symptoms (you have a positive screening result), you may need further evaluation and be referred to a mental health care provider. Diabetes self-management education  Education about how to manage your diabetes is recommended at diagnosis and ongoing as needed. Treatment plan  Your treatment plan will be reviewed at every medical visit. Summary  Managing diabetes (diabetes mellitus) can be complicated. Your diabetes treatment may be managed by a team of health care providers.  Your health care providers follow a schedule in order to help you get the best quality of care.  Standards of care including having regular physical exams, blood tests, blood pressure monitoring, immunizations, screening tests, and education about how to manage your diabetes.  Your health care providers may also give you more specific instructions based on your individual health. This information is not intended to replace advice given to you by your health care provider. Make sure you discuss any questions you have with your health care provider. Document Released: 10/19/2008 Document Revised: 09/20/2015 Document Reviewed: 09/20/2015 Elsevier Interactive Patient Education  [employer].

## 2016-09-02 NOTE — Progress Notes (Signed)
Subjective:  Patient ID: Rebekah Johnson, female    DOB: 04/22/1960  Age: 56 y.o. MRN: [id number]  CC: Diabetes   HPI Rebekah Johnson  is a 56 year old female with a history of type 2 diabetes mellitus (A1c 9.4), diabetic neuropathy, peripheral vascular disease (status post left SFA and popliteal stent and left SFA PTA with drug coated balloon in 08/2015), Left foot transmetatarsal amputation,  tobacco abuse, hypertension who presents today for follow-up visit.  Her A1c has trended up from 8.0 to 9.4 and she endorses compliance with her medications. She has not adhered to a diabetic diet and has been drinking a lot of Pepsi. Does not exercise either. She declines discussion about injectables and would like to work on her lifestyle and diet.  Her blood pressure is also significantly elevated and she endorses compliance with all her antihypertensives.  She has no acute concerns today and denies any calf pain. She denies side effects from medications.  Past Medical History:  Diagnosis Date  . Diabetes mellitus without complication ([place])   . High cholesterol   . Hypertension     Past Surgical History:  Procedure Laterality Date  . ABDOMINAL HYSTERECTOMY    . AMPUTATION Left 09/06/2015   Procedure: LEFT TRANSMETATARSAL AMPUTATTION;  Surgeon: Newt Minion, MD;  Location: [facility];  Service: Orthopedics;  Laterality: Left;  . PERIPHERAL VASCULAR CATHETERIZATION N/A 08/16/2015   Procedure: Abdominal Aortogram;  Surgeon: Elam Dutch, MD;  Location: [facility];  Service: Cardiovascular;  Laterality: N/A;  . PERIPHERAL VASCULAR CATHETERIZATION Bilateral 08/16/2015   Procedure: Lower Extremity Angiography;  Surgeon: Elam Dutch, MD;  Location: [facility];  Service: Cardiovascular;  Laterality: Bilateral;  . PERIPHERAL VASCULAR CATHETERIZATION Left 08/16/2015   Procedure: Peripheral Vascular Intervention;  Surgeon: Elam Dutch, MD;  Location: [facility];   Service: Cardiovascular;  Laterality: Left;  SFA STENT X 2    No Known Allergies   Outpatient Medications Prior to Visit  Medication Sig Dispense Refill  . aspirin 81 MG EC tablet Take 1 tablet (81 mg total) by mouth daily. Reported on 07/13/2015 90 tablet 0  . blood glucose meter kit and supplies KIT Dispense based on patient and insurance preference. Use up to four times daily as directed. (FOR ICD-9 250.00, 250.01). 1 each 3  . Blood Glucose Monitoring Suppl (ACCU-CHEK AVIVA) device Use as instructed three times daily. 1 each 0  . Cholecalciferol (VITAMIN D-3) 1000 units CAPS Take 1 capsule by mouth daily.    [name] docusate sodium (COLACE) 100 MG capsule TAKE 1 CAPSULE BY MOUTH TWICE A DAY 60 capsule 0  . glucose blood (ACCU-CHEK AVIVA) test strip Use as instructed three times daily before meals. 100 each 12  . Lancet Devices (ACCU-CHEK SOFTCLIX) lancets Use as instructed three times daily before meals. 1 each 5  . Omega-3 Fatty Acids (FISH OIL) 1000 MG CAPS Take 1 capsule by mouth daily. Reported on 07/13/2015    . amLODipine (NORVASC) 10 MG tablet Take 1 tablet (10 mg total) by mouth daily. Reported on 07/13/2015 30 tablet 3  . atorvastatin (LIPITOR) 40 MG tablet Take 1 tablet (40 mg total) by mouth daily. Reported on 07/13/2015 30 tablet 3  . carvedilol (COREG) 25 MG tablet Take 1 tablet (25 mg total) by mouth 2 (two) times daily with a meal. 60 tablet 3  . clopidogrel (PLAVIX) 75 MG tablet Take 1 tablet (75 mg total) by mouth daily. 30 tablet 3  .  gabapentin (NEURONTIN) 300 MG capsule Take 2 capsules (600 mg total) by mouth 3 (three) times daily as needed (neuropathic pain). 180 capsule 3  . glipiZIDE (GLUCOTROL) 10 MG tablet Take 1 tablet (10 mg total) by mouth 2 (two) times daily before a meal. 60 tablet 3  . isosorbide mononitrate (IMDUR) 60 MG 24 hr tablet Take 1 tablet (60 mg total) by mouth daily. 30 tablet 3  . lisinopril (PRINIVIL,ZESTRIL) 20 MG tablet Take 0.5 tablets (10 mg total) by  mouth daily. 30 tablet 3  . sitaGLIPtin (JANUVIA) 50 MG tablet Take 1 tablet (50 mg total) by mouth daily. 90 tablet 0   No facility-administered medications prior to visit.     ROS Review of Systems  Constitutional: Negative for activity change, appetite change and fatigue.  HENT: Negative for congestion, sinus pressure and sore throat.   Eyes: Negative for visual disturbance.  Respiratory: Negative for cough, chest tightness, shortness of breath and wheezing.   Cardiovascular: Negative for chest pain and palpitations.  Gastrointestinal: Negative for abdominal distention, abdominal pain and constipation.  Endocrine: Negative for polydipsia.  Genitourinary: Negative for dysuria and frequency.  Musculoskeletal: Negative for arthralgias and back pain.  Skin: Negative for rash.  Neurological: Negative for tremors, light-headedness and numbness.  Hematological: Does not bruise/bleed easily.  Psychiatric/Behavioral: Negative for agitation and behavioral problems.    Objective:  BP (!) 182/77   Pulse 78   Temp 98.4 F (36.9 C) (Oral)   Ht _0  (1.626 m)   Wt 204 lb 12.8 oz (92.9 kg)   SpO2 99%   BMI 35.15 kg/m   BP/Weight 09/02/2016 4/0/1027 02/09/3662  Systolic BP 403 474 -  Diastolic BP 77 63 -  Wt. (Lbs) 204.8 196.2 202  BMI 35.15 33.68 34.67      Physical Exam Constitutional: She is oriented to person, place, and time. She appears well-developed and well-nourished.  Cardiovascular: Normal rate, normal heart sounds and intact distal pulses.   No murmur heard. 1+ bilateral pitting ankle edema Pulmonary/Chest: Effort normal and breath sounds normal. She has no wheezes. She has no rales. She exhibits no tenderness.  Abdominal: Soft. Bowel sounds are normal. She exhibits no distension and no mass. There is no tenderness.  Musculoskeletal: Normal range of motion.  Neurological: She is alert and oriented to person, place, and time.  Skin: Skin is warm and dry.  Psychiatric:  She has a normal mood and affect.   Lab Results  Component Value Date   HGBA1C 9.4 09/02/2016    Assessment & Plan:   1. Type 2 diabetes mellitus with hyperosmolarity without coma, without long-term current use of insulin (HCC) Uncontrolled with A1c of 9.4 which has trended up from 8.0 previously She is currently resisting initiation of injectables I have had a candid conversation with her indicating the fact that this might be the only option we have at this time Unable to use SGLT2s due to history of amputation She is promising to work on diet and lifestyle - POCT glucose (manual entry) - POCT glycosylated hemoglobin (Hb Q5Z) - Basic Metabolic Panel; Future  2. Essential (primary) hypertension Uncontrolled Increased dose of lisinopril Basic metabolic panel - lisinopril (PRINIVIL,ZESTRIL) 20 MG tablet; Take 1 tablet (20 mg total) by mouth daily.  Dispense: 30 tablet; Refill: 3 - isosorbide mononitrate (IMDUR) 60 MG 24 hr tablet; Take 1 tablet (60 mg total) by mouth daily.  Dispense: 30 tablet; Refill: 3 - carvedilol (COREG) 25 MG tablet; Take 1 tablet (25  mg total) by mouth 2 (two) times daily with a meal.  Dispense: 60 tablet; Refill: 3 - amLODipine (NORVASC) 10 MG tablet; Take 1 tablet (10 mg total) by mouth daily. Reported on 07/13/2015  Dispense: 30 tablet; Refill: 3  3. Peripheral vascular disease (HCC) Risk factor modification - clopidogrel (PLAVIX) 75 MG tablet; Take 1 tablet (75 mg total) by mouth daily.  Dispense: 30 tablet; Refill: 3  4. Other diabetic neurological complication associated with type 2 diabetes mellitus (HCC) Controlled on gabapentin - gabapentin (NEURONTIN) 300 MG capsule; Take 2 capsules (600 mg total) by mouth 3 (three) times daily as needed (neuropathic pain).  Dispense: 180 capsule; Refill: 3 - sitaGLIPtin (JANUVIA) 50 MG tablet; Take 1 tablet (50 mg total) by mouth daily.  Dispense: 30 tablet; Refill: 3 - glipiZIDE (GLUCOTROL) 10 MG tablet; Take 1  tablet (10 mg total) by mouth 2 (two) times daily before a meal.  Dispense: 60 tablet; Refill: 3  5. Pure hypercholesterolemia Stable - atorvastatin (LIPITOR) 40 MG tablet; Take 1 tablet (40 mg total) by mouth daily. Reported on 07/13/2015  Dispense: 30 tablet; Refill: 3   Meds ordered this encounter  Medications  . lisinopril (PRINIVIL,ZESTRIL) 20 MG tablet    Sig: Take 1 tablet (20 mg total) by mouth daily.    Dispense:  30 tablet    Refill:  3    Discontinue previous dose  . clopidogrel (PLAVIX) 75 MG tablet    Sig: Take 1 tablet (75 mg total) by mouth daily.    Dispense:  30 tablet    Refill:  3  . gabapentin (NEURONTIN) 300 MG capsule    Sig: Take 2 capsules (600 mg total) by mouth 3 (three) times daily as needed (neuropathic pain).    Dispense:  180 capsule    Refill:  3    To replace tablets  . isosorbide mononitrate (IMDUR) 60 MG 24 hr tablet    Sig: Take 1 tablet (60 mg total) by mouth daily.    Dispense:  30 tablet    Refill:  3  . sitaGLIPtin (JANUVIA) 50 MG tablet    Sig: Take 1 tablet (50 mg total) by mouth daily.    Dispense:  30 tablet    Refill:  3  . atorvastatin (LIPITOR) 40 MG tablet    Sig: Take 1 tablet (40 mg total) by mouth daily. Reported on 07/13/2015    Dispense:  30 tablet    Refill:  3  . carvedilol (COREG) 25 MG tablet    Sig: Take 1 tablet (25 mg total) by mouth 2 (two) times daily with a meal.    Dispense:  60 tablet    Refill:  3    Discontinue her previous dose  . amLODipine (NORVASC) 10 MG tablet    Sig: Take 1 tablet (10 mg total) by mouth daily. Reported on 07/13/2015    Dispense:  30 tablet    Refill:  3  . glipiZIDE (GLUCOTROL) 10 MG tablet    Sig: Take 1 tablet (10 mg total) by mouth 2 (two) times daily before a meal.    Dispense:  60 tablet    Refill:  3    Follow-up: Return in about 3 months (around 12/03/2016) for follow up of Diabetes.   Arnoldo Morale MD

## 2016-09-09 ENCOUNTER — Ambulatory Visit: Payer: Medicaid Other | Attending: Family Medicine

## 2016-09-09 DIAGNOSIS — E1100 Type 2 diabetes mellitus with hyperosmolarity without nonketotic hyperglycemic-hyperosmolar coma (NKHHC): Secondary | ICD-10-CM | POA: Insufficient documentation

## 2016-09-09 NOTE — Progress Notes (Signed)
Patient here for lab visit only 

## 2016-09-10 LAB — BASIC METABOLIC PANEL
BUN / CREAT RATIO: 9 (ref 9–23)
BUN: 13 mg/dL (ref 6–24)
CALCIUM: 8.5 mg/dL — AB (ref 8.7–10.2)
CHLORIDE: 103 mmol/L (ref 96–106)
CO2: 24 mmol/L (ref 20–29)
Creatinine, Ser: 1.45 mg/dL — ABNORMAL HIGH (ref 0.57–1.00)
GFR calc Af Amer: 46 mL/min/{1.73_m2} — ABNORMAL LOW (ref >59)
GFR calc non Af Amer: 40 mL/min/{1.73_m2} — ABNORMAL LOW (ref >59)
GLUCOSE: 170 mg/dL — AB (ref 65–99)
Potassium: 4.1 mmol/L (ref 3.5–5.2)
Sodium: 140 mmol/L (ref 134–144)

## 2016-09-11 ENCOUNTER — Telehealth: Payer: Self-pay

## 2016-09-11 NOTE — Telephone Encounter (Signed)
Pt was called and informed of lab results. 

## 2016-09-14 ENCOUNTER — Encounter (HOSPITAL_BASED_OUTPATIENT_CLINIC_OR_DEPARTMENT_OTHER): Payer: Self-pay | Admitting: Emergency Medicine

## 2016-09-14 ENCOUNTER — Inpatient Hospital Stay (HOSPITAL_BASED_OUTPATIENT_CLINIC_OR_DEPARTMENT_OTHER)
Admission: EM | Admit: 2016-09-14 | Discharge: 2016-09-18 | DRG: 280 | Disposition: A | Discharge status: Home / Self Care | Payer: Medicaid Other | Attending: Internal Medicine | Admitting: Internal Medicine

## 2016-09-14 ENCOUNTER — Emergency Department (HOSPITAL_BASED_OUTPATIENT_CLINIC_OR_DEPARTMENT_OTHER): Payer: Medicaid Other

## 2016-09-14 DIAGNOSIS — E0842 Diabetes mellitus due to underlying condition with diabetic polyneuropathy: Secondary | ICD-10-CM | POA: Diagnosis not present

## 2016-09-14 DIAGNOSIS — J810 Acute pulmonary edema: Secondary | ICD-10-CM | POA: Diagnosis present

## 2016-09-14 DIAGNOSIS — I340 Nonrheumatic mitral (valve) insufficiency: Secondary | ICD-10-CM | POA: Diagnosis not present

## 2016-09-14 DIAGNOSIS — Z79899 Other long term (current) drug therapy: Secondary | ICD-10-CM

## 2016-09-14 DIAGNOSIS — I739 Peripheral vascular disease, unspecified: Secondary | ICD-10-CM | POA: Diagnosis not present

## 2016-09-14 DIAGNOSIS — E7800 Pure hypercholesterolemia, unspecified: Secondary | ICD-10-CM | POA: Diagnosis present

## 2016-09-14 DIAGNOSIS — E669 Obesity, unspecified: Secondary | ICD-10-CM | POA: Diagnosis present

## 2016-09-14 DIAGNOSIS — I5042 Chronic combined systolic (congestive) and diastolic (congestive) heart failure: Secondary | ICD-10-CM | POA: Diagnosis present

## 2016-09-14 DIAGNOSIS — Z6835 Body mass index (BMI) 35.0-35.9, adult: Secondary | ICD-10-CM | POA: Diagnosis not present

## 2016-09-14 DIAGNOSIS — I5033 Acute on chronic diastolic (congestive) heart failure: Secondary | ICD-10-CM | POA: Diagnosis present

## 2016-09-14 DIAGNOSIS — I110 Hypertensive heart disease with heart failure: Secondary | ICD-10-CM | POA: Diagnosis not present

## 2016-09-14 DIAGNOSIS — E785 Hyperlipidemia, unspecified: Secondary | ICD-10-CM | POA: Diagnosis present

## 2016-09-14 DIAGNOSIS — I2582 Chronic total occlusion of coronary artery: Secondary | ICD-10-CM | POA: Diagnosis present

## 2016-09-14 DIAGNOSIS — Z7984 Long term (current) use of oral hypoglycemic drugs: Secondary | ICD-10-CM

## 2016-09-14 DIAGNOSIS — N189 Chronic kidney disease, unspecified: Secondary | ICD-10-CM | POA: Diagnosis not present

## 2016-09-14 DIAGNOSIS — Z23 Encounter for immunization: Secondary | ICD-10-CM | POA: Diagnosis not present

## 2016-09-14 DIAGNOSIS — N183 Chronic kidney disease, stage 3 (moderate): Secondary | ICD-10-CM | POA: Diagnosis present

## 2016-09-14 DIAGNOSIS — F17210 Nicotine dependence, cigarettes, uncomplicated: Secondary | ICD-10-CM | POA: Diagnosis present

## 2016-09-14 DIAGNOSIS — N179 Acute kidney failure, unspecified: Secondary | ICD-10-CM | POA: Diagnosis present

## 2016-09-14 DIAGNOSIS — I429 Cardiomyopathy, unspecified: Secondary | ICD-10-CM | POA: Diagnosis present

## 2016-09-14 DIAGNOSIS — Z7982 Long term (current) use of aspirin: Secondary | ICD-10-CM | POA: Diagnosis not present

## 2016-09-14 DIAGNOSIS — J9600 Acute respiratory failure, unspecified whether with hypoxia or hypercapnia: Secondary | ICD-10-CM | POA: Diagnosis present

## 2016-09-14 DIAGNOSIS — E1151 Type 2 diabetes mellitus with diabetic peripheral angiopathy without gangrene: Secondary | ICD-10-CM | POA: Diagnosis present

## 2016-09-14 DIAGNOSIS — E876 Hypokalemia: Secondary | ICD-10-CM | POA: Diagnosis present

## 2016-09-14 DIAGNOSIS — I2510 Atherosclerotic heart disease of native coronary artery without angina pectoris: Secondary | ICD-10-CM | POA: Diagnosis present

## 2016-09-14 DIAGNOSIS — E1121 Type 2 diabetes mellitus with diabetic nephropathy: Secondary | ICD-10-CM | POA: Diagnosis not present

## 2016-09-14 DIAGNOSIS — R1012 Left upper quadrant pain: Secondary | ICD-10-CM | POA: Diagnosis present

## 2016-09-14 DIAGNOSIS — E1165 Type 2 diabetes mellitus with hyperglycemia: Secondary | ICD-10-CM | POA: Diagnosis present

## 2016-09-14 DIAGNOSIS — I214 Non-ST elevation (NSTEMI) myocardial infarction: Secondary | ICD-10-CM | POA: Diagnosis present

## 2016-09-14 DIAGNOSIS — I10 Essential (primary) hypertension: Secondary | ICD-10-CM | POA: Diagnosis present

## 2016-09-14 DIAGNOSIS — D631 Anemia in chronic kidney disease: Secondary | ICD-10-CM | POA: Diagnosis present

## 2016-09-14 DIAGNOSIS — Z9114 Patient's other noncompliance with medication regimen: Secondary | ICD-10-CM

## 2016-09-14 DIAGNOSIS — I509 Heart failure, unspecified: Secondary | ICD-10-CM | POA: Diagnosis not present

## 2016-09-14 DIAGNOSIS — J9601 Acute respiratory failure with hypoxia: Secondary | ICD-10-CM | POA: Diagnosis present

## 2016-09-14 DIAGNOSIS — I130 Hypertensive heart and chronic kidney disease with heart failure and stage 1 through stage 4 chronic kidney disease, or unspecified chronic kidney disease: Secondary | ICD-10-CM | POA: Diagnosis present

## 2016-09-14 DIAGNOSIS — E1122 Type 2 diabetes mellitus with diabetic chronic kidney disease: Secondary | ICD-10-CM | POA: Diagnosis present

## 2016-09-14 DIAGNOSIS — E1149 Type 2 diabetes mellitus with other diabetic neurological complication: Secondary | ICD-10-CM

## 2016-09-14 DIAGNOSIS — E1140 Type 2 diabetes mellitus with diabetic neuropathy, unspecified: Secondary | ICD-10-CM | POA: Diagnosis present

## 2016-09-14 DIAGNOSIS — I5031 Acute diastolic (congestive) heart failure: Secondary | ICD-10-CM

## 2016-09-14 HISTORY — DX: Acute diastolic (congestive) heart failure: I50.31

## 2016-09-14 LAB — CBC WITH DIFFERENTIAL/PLATELET
BASOS ABS: 0 10*3/uL (ref 0.0–0.1)
BASOS PCT: 0 %
EOS ABS: 0.1 10*3/uL (ref 0.0–0.7)
Eosinophils Relative: 0 %
HCT: 42.8 % (ref 36.0–46.0)
Hemoglobin: 14.2 g/dL (ref 12.0–15.0)
LYMPHS PCT: 11 %
Lymphs Abs: 1.8 10*3/uL (ref 0.7–4.0)
MCH: 26.8 pg (ref 26.0–34.0)
MCHC: 33.2 g/dL (ref 30.0–36.0)
MCV: 80.9 fL (ref 78.0–100.0)
Monocytes Absolute: 0.7 10*3/uL (ref 0.1–1.0)
Monocytes Relative: 4 %
Neutro Abs: 13.8 10*3/uL — ABNORMAL HIGH (ref 1.7–7.7)
Neutrophils Relative %: 85 %
PLATELETS: 329 10*3/uL (ref 150–400)
RBC: 5.29 MIL/uL — AB (ref 3.87–5.11)
RDW: 14.4 % (ref 11.5–15.5)
WBC: 16.3 10*3/uL — AB (ref 4.0–10.5)

## 2016-09-14 LAB — COMPREHENSIVE METABOLIC PANEL
ALBUMIN: 3.2 g/dL — AB (ref 3.5–5.0)
ALT: 21 U/L (ref 14–54)
AST: 25 U/L (ref 15–41)
Alkaline Phosphatase: 131 U/L — ABNORMAL HIGH (ref 38–126)
Anion gap: 9 (ref 5–15)
BUN: 15 mg/dL (ref 6–20)
CO2: 27 mmol/L (ref 22–32)
CREATININE: 1.79 mg/dL — AB (ref 0.44–1.00)
Calcium: 9.1 mg/dL (ref 8.9–10.3)
Chloride: 103 mmol/L (ref 101–111)
GFR calc Af Amer: 35 mL/min — ABNORMAL LOW (ref >60)
GFR calc non Af Amer: 30 mL/min — ABNORMAL LOW (ref >60)
Glucose, Bld: 287 mg/dL — ABNORMAL HIGH (ref 65–99)
Potassium: 3.6 mmol/L (ref 3.5–5.1)
SODIUM: 139 mmol/L (ref 135–145)
Total Bilirubin: 0.4 mg/dL (ref 0.3–1.2)
Total Protein: 8.1 g/dL (ref 6.5–8.1)

## 2016-09-14 LAB — GLUCOSE, CAPILLARY
GLUCOSE-CAPILLARY: 130 mg/dL — AB (ref 65–99)
GLUCOSE-CAPILLARY: 124 mg/dL — AB (ref 65–99)
Glucose-Capillary: 214 mg/dL — ABNORMAL HIGH (ref 65–99)

## 2016-09-14 LAB — HEMOGLOBIN A1C
Hgb A1c MFr Bld: 8.8 % — ABNORMAL HIGH (ref 4.8–5.6)
Mean Plasma Glucose: 205.86 mg/dL

## 2016-09-14 LAB — TROPONIN I
Troponin I: 1.05 ng/mL (ref <0.03)
Troponin I: 2 ng/mL (ref <0.03)

## 2016-09-14 LAB — MAGNESIUM: Magnesium: 1.7 mg/dL (ref 1.7–2.4)

## 2016-09-14 LAB — TSH: TSH: 0.959 u[IU]/mL (ref 0.350–4.500)

## 2016-09-14 LAB — PROCALCITONIN

## 2016-09-14 LAB — BRAIN NATRIURETIC PEPTIDE: B Natriuretic Peptide: 549 pg/mL — ABNORMAL HIGH (ref 0.0–100.0)

## 2016-09-14 LAB — LIPASE, BLOOD: Lipase: 28 U/L (ref 11–51)

## 2016-09-14 MED ORDER — ONDANSETRON HCL 4 MG PO TABS
4.0000 mg | ORAL_TABLET | Freq: Four times a day (QID) | ORAL | Status: DC | PRN
  Filled 2016-09-14: qty 1

## 2016-09-14 MED ORDER — CLOPIDOGREL BISULFATE 75 MG PO TABS
75.0000 mg | ORAL_TABLET | Freq: Every day | ORAL | Status: DC
  Administered 2016-09-14 – 2016-09-18 (×5): 75 mg via ORAL
  Filled 2016-09-14 (×5): qty 1

## 2016-09-14 MED ORDER — ACETAMINOPHEN 650 MG RE SUPP: 650.0000 mg | Freq: Four times a day (QID) | RECTAL | Status: DC | PRN

## 2016-09-14 MED ORDER — GABAPENTIN 300 MG PO CAPS
300.0000 mg | ORAL_CAPSULE | Freq: Three times a day (TID) | ORAL | Status: DC
  Administered 2016-09-14 – 2016-09-18 (×12): 300 mg via ORAL
  Filled 2016-09-14 (×12): qty 1

## 2016-09-14 MED ORDER — ISOSORBIDE MONONITRATE ER 30 MG PO TB24: 30.0000 mg | ORAL_TABLET | Freq: Every day | ORAL | Status: DC

## 2016-09-14 MED ORDER — ONDANSETRON HCL 4 MG/2ML IJ SOLN: 4.0000 mg | Freq: Four times a day (QID) | INTRAMUSCULAR | Status: DC | PRN

## 2016-09-14 MED ORDER — HEPARIN SODIUM (PORCINE) 5000 UNIT/ML IJ SOLN
5000.0000 [IU] | Freq: Three times a day (TID) | INTRAMUSCULAR | Status: DC
  Filled 2016-09-14: qty 1

## 2016-09-14 MED ORDER — FUROSEMIDE 10 MG/ML IJ SOLN
40.0000 mg | Freq: Two times a day (BID) | INTRAMUSCULAR | Status: DC
  Administered 2016-09-14 – 2016-09-16 (×4): 40 mg via INTRAVENOUS
  Filled 2016-09-14 (×4): qty 4

## 2016-09-14 MED ORDER — ATORVASTATIN CALCIUM 40 MG PO TABS
40.0000 mg | ORAL_TABLET | Freq: Every day | ORAL | Status: DC
  Administered 2016-09-14 – 2016-09-17 (×4): 40 mg via ORAL
  Filled 2016-09-14 (×4): qty 1

## 2016-09-14 MED ORDER — CARVEDILOL 25 MG PO TABS
25.0000 mg | ORAL_TABLET | Freq: Two times a day (BID) | ORAL | Status: DC
  Administered 2016-09-14 – 2016-09-18 (×8): 25 mg via ORAL
  Filled 2016-09-14 (×9): qty 1

## 2016-09-14 MED ORDER — FUROSEMIDE 10 MG/ML IJ SOLN: 40.0000 mg | Freq: Two times a day (BID) | INTRAMUSCULAR | Status: DC

## 2016-09-14 MED ORDER — ASPIRIN EC 81 MG PO TBEC
81.0000 mg | DELAYED_RELEASE_TABLET | Freq: Every day | ORAL | Status: DC
  Administered 2016-09-14 – 2016-09-18 (×4): 81 mg via ORAL
  Filled 2016-09-14 (×5): qty 1

## 2016-09-14 MED ORDER — NITROGLYCERIN IN D5W 200-5 MCG/ML-% IV SOLN
0.0000 ug/min | Freq: Once | INTRAVENOUS | Status: AC
  Administered 2016-09-14: 5 ug/min via INTRAVENOUS
  Filled 2016-09-14: qty 250

## 2016-09-14 MED ORDER — TRAMADOL HCL 50 MG PO TABS
50.0000 mg | ORAL_TABLET | Freq: Four times a day (QID) | ORAL | Status: DC | PRN
  Administered 2016-09-15 – 2016-09-18 (×4): 50 mg via ORAL
  Filled 2016-09-14 (×5): qty 1

## 2016-09-14 MED ORDER — ISOSORBIDE MONONITRATE ER 60 MG PO TB24
60.0000 mg | ORAL_TABLET | Freq: Every day | ORAL | Status: DC
  Administered 2016-09-14 – 2016-09-18 (×5): 60 mg via ORAL
  Filled 2016-09-14 (×5): qty 1

## 2016-09-14 MED ORDER — FUROSEMIDE 10 MG/ML IJ SOLN
20.0000 mg | Freq: Once | INTRAMUSCULAR | Status: AC
  Administered 2016-09-14: 20 mg via INTRAVENOUS
  Filled 2016-09-14: qty 2

## 2016-09-14 MED ORDER — SODIUM CHLORIDE 0.9% FLUSH
3.0000 mL | INTRAVENOUS | Status: DC | PRN
  Administered 2016-09-14: 3 mL via INTRAVENOUS
  Filled 2016-09-14: qty 3

## 2016-09-14 MED ORDER — ISOSORBIDE MONONITRATE ER 60 MG PO TB24: 60.0000 mg | ORAL_TABLET | Freq: Every day | ORAL | Status: DC

## 2016-09-14 MED ORDER — ACETAMINOPHEN 325 MG PO TABS
650.0000 mg | ORAL_TABLET | Freq: Four times a day (QID) | ORAL | Status: DC | PRN
Start: 2016-09-14 — End: 2016-09-17
  Administered 2016-09-15: 650 mg via ORAL
  Filled 2016-09-14 (×2): qty 2

## 2016-09-14 MED ORDER — INSULIN ASPART 100 UNIT/ML ~~LOC~~ SOLN
0.0000 [IU] | Freq: Three times a day (TID) | SUBCUTANEOUS | Status: DC
  Administered 2016-09-16: 3 [IU] via SUBCUTANEOUS
  Administered 2016-09-16: 2 [IU] via SUBCUTANEOUS
  Administered 2016-09-17: 3 [IU] via SUBCUTANEOUS

## 2016-09-14 MED ORDER — LISINOPRIL 20 MG PO TABS
20.0000 mg | ORAL_TABLET | Freq: Every day | ORAL | Status: DC
  Administered 2016-09-14: 20 mg via ORAL
  Filled 2016-09-14: qty 2

## 2016-09-14 MED ORDER — AMLODIPINE BESYLATE 10 MG PO TABS
10.0000 mg | ORAL_TABLET | Freq: Every day | ORAL | Status: DC
  Administered 2016-09-14 – 2016-09-18 (×5): 10 mg via ORAL
  Filled 2016-09-14: qty 2
  Filled 2016-09-14 (×4): qty 1

## 2016-09-14 MED ORDER — HYDRALAZINE HCL 25 MG PO TABS
25.0000 mg | ORAL_TABLET | Freq: Four times a day (QID) | ORAL | Status: DC
  Administered 2016-09-14 – 2016-09-15 (×5): 25 mg via ORAL
  Filled 2016-09-14 (×5): qty 1

## 2016-09-14 MED ORDER — SODIUM CHLORIDE 0.9 % IV SOLN: 250.0000 mL | INTRAVENOUS | Status: DC | PRN

## 2016-09-14 MED ORDER — SODIUM CHLORIDE 0.9% FLUSH
3.0000 mL | Freq: Two times a day (BID) | INTRAVENOUS | Status: DC
  Administered 2016-09-14 – 2016-09-17 (×7): 3 mL via INTRAVENOUS

## 2016-09-14 MED ORDER — INSULIN ASPART 100 UNIT/ML ~~LOC~~ SOLN: 0.0000 [IU] | Freq: Every day | SUBCUTANEOUS | Status: DC

## 2016-09-14 NOTE — H&P (Signed)
History and Physical    Rebekah Johnson XQJ:[id number] DOB: [birth year] DOA: 09/14/2016  PCP: Arnoldo Morale, MD Patient coming from:  [facility]  Chief Complaint: SOB and diaphoresis  HPI: Rebekah Johnson is a 56 y.o. female with medical history significant of birth control hypertension, hyperlipidemia, diabetes, left transmetatarsal amputation. Patient reports running out of her medications approximate 2 weeks prior to admission. After 2 days she was able to get these refilled. During that today. Patient noted that her blood pressure was running as high as 225/105 and a glucose ran in the 300 range. When she is able to get her medications refilled her blood pressure resumed its normal state with a systolic reading of approximately 170-180. Patient reports 5 days prior to admission developing a short-lived episode of profound diaphoresis and weakness. Patient initially thought that her glucose was too high or too low. She checked her glucose which was 225 which she reports to be somewhat average. Patient had a recurrent episode of her diaphoresis and weakness 4 days prior to admission again this was short-lived. On the day prior to admission patient reports profound persistent shortness of breath, diaphoresis, and headache. Patient states she is unable to sleep the night prior to admission due to a continual sensation of being out of breath. Patient denies any fevers but endorses mild productive cough with whitish phlegm area denies chest pain, nausea, vomiting, rash, diarrhea, dysuria, frequency.  ED Course: Patient found to be profoundly hypoxic with O2 saturations in the 70s. Placed on BiPAP at [facility] prior to transfer. Patient given 20 mg of IV Lasix and her home antihypertensives.  Review of Systems: As per HPI otherwise all other systems reviewed and are negative  Ambulatory Status: no restrictions  Past Medical History:  Diagnosis Date  . Diabetes mellitus without complication  ([place])   . High cholesterol   . Hypertension     Past Surgical History:  Procedure Laterality Date  . ABDOMINAL HYSTERECTOMY    . AMPUTATION Left 09/06/2015   Procedure: LEFT TRANSMETATARSAL AMPUTATTION;  Surgeon: Newt Minion, MD;  Location: [place];  Service: Orthopedics;  Laterality: Left;  . PERIPHERAL VASCULAR CATHETERIZATION N/A 08/16/2015   Procedure: Abdominal Aortogram;  Surgeon: Elam Dutch, MD;  Location: [place];  Service: Cardiovascular;  Laterality: N/A;  . PERIPHERAL VASCULAR CATHETERIZATION Bilateral 08/16/2015   Procedure: Lower Extremity Angiography;  Surgeon: Elam Dutch, MD;  Location: [place] [facility];  Service: Cardiovascular;  Laterality: Bilateral;  . PERIPHERAL VASCULAR CATHETERIZATION Left 08/16/2015   Procedure: Peripheral Vascular Intervention;  Surgeon: Elam Dutch, MD;  Location: [place] [facility];  Service: Cardiovascular;  Laterality: Left;  SFA STENT X 2    Social History   Social History  . Marital status: Married    Spouse name: N/A  . Number of children: N/A  . Years of education: N/A   Occupational History  . Not on file.   Social History Main Topics  . Smoking status: Current Every Day Smoker    Packs/day: 0.25    Types: Cigarettes  . Smokeless tobacco: Never Used     Comment: 1 pk last 1 1/2 days  . Alcohol use 0.6 - 1.2 oz/week    1 - 2 Glasses of wine per week     Comment: on social occassions  . Drug use: No  . Sexual activity: Yes    Partners: Male   Other Topics Concern  . Not on file   Social  History Narrative  . No narrative on file    No Known Allergies  Family History  Problem Relation Age of Onset  . Diabetes Mother   . Hypertension Father       Prior to Admission medications   Medication Sig Start Date End Date Taking? Authorizing Provider  amLODipine (NORVASC) 10 MG tablet Take 1 tablet (10 mg total) by mouth daily. Reported on 07/13/2015 09/02/16 09/02/17 Yes Arnoldo Morale, MD  aspirin 81  MG EC tablet Take 1 tablet (81 mg total) by mouth daily. Reported on 07/13/2015 07/02/16  Yes Arnoldo Morale, MD  atorvastatin (LIPITOR) 40 MG tablet Take 1 tablet (40 mg total) by mouth daily. Reported on 07/13/2015 09/02/16  Yes Arnoldo Morale, MD  carvedilol (COREG) 25 MG tablet Take 1 tablet (25 mg total) by mouth 2 (two) times daily with a meal. 09/02/16  Yes Amao, [name], MD  Cholecalciferol (VITAMIN D-3) 1000 units CAPS Take 1,000 Units by mouth daily.    Yes [provider]  clopidogrel (PLAVIX) 75 MG tablet Take 1 tablet (75 mg total) by mouth daily. 09/02/16  Yes Arnoldo Morale, MD  docusate sodium (COLACE) 100 MG capsule TAKE 1 CAPSULE BY MOUTH TWICE A DAY Patient taking differently: TAKE 1 CAPSULE (100 MG) BY MOUTH TWICE A DAY 08/26/16  Yes Arnoldo Morale, MD  gabapentin (NEURONTIN) 300 MG capsule Take 2 capsules (600 mg total) by mouth 3 (three) times daily as needed (neuropathic pain). Patient taking differently: Take 300 mg by mouth 3 (three) times daily.  09/02/16  Yes Arnoldo Morale, MD  glipiZIDE (GLUCOTROL) 10 MG tablet Take 1 tablet (10 mg total) by mouth 2 (two) times daily before a meal. 09/02/16  Yes Amao, Enobong, MD  ibuprofen (ADVIL,MOTRIN) 400 MG tablet Take 400 mg by mouth every 6 (six) hours as needed for headache (pain).   Yes [provider]  isosorbide mononitrate (IMDUR) 60 MG 24 hr tablet Take 1 tablet (60 mg total) by mouth daily. 09/02/16  Yes Arnoldo Morale, MD  lisinopril (PRINIVIL,ZESTRIL) 20 MG tablet Take 1 tablet (20 mg total) by mouth daily. 09/02/16  Yes Arnoldo Morale, MD  Omega-3 Fatty Acids (FISH OIL) 1000 MG CAPS Take 1,000 mg by mouth daily. Reported on 07/13/2015   Yes [provider]  sitaGLIPtin (JANUVIA) 50 MG tablet Take 1 tablet (50 mg total) by mouth daily. 09/02/16  Yes Arnoldo Morale, MD  blood glucose meter kit and supplies KIT Dispense based on patient and insurance preference. Use up to four times daily as directed. (FOR ICD-9 250.00,  250.01). 07/25/15   Ward, Delice Bison, DO  Blood Glucose Monitoring Suppl (ACCU-CHEK AVIVA) device Use as instructed three times daily. 04/22/16   Arnoldo Morale, MD  glucose blood (ACCU-CHEK AVIVA) test strip Use as instructed three times daily before meals. 04/22/16   Arnoldo Morale, MD  Lancet Devices [facility]) lancets Use as instructed three times daily before meals. 04/22/16   Arnoldo Morale, MD    Physical Exam: Vitals:   09/14/16 1240 09/14/16 1245 09/14/16 1355 09/14/16 1448  BP: 137/71 130/70  (!) 164/93  Pulse: 76 67 78   Resp: (!) 25 (!) 24 (!) 23   Temp:    98.2 F (36.8 C)  TempSrc:    Oral  SpO2: 100% 95% 96%   Weight:    92.5 kg (203 lb 14.8 oz)  Height:    _0  (1.626 m)     General: Appears mildly anxious, resting in bed. Eyes:  PERRL, EOMI, normal lids, iris ENT:  grossly normal hearing, lips & tongue, mmm Neck:  no LAD, masses or thyromegaly Cardiovascular:  RRR, II/VI m/r/g. 1+  LE edema.  Respiratory: Increased effort. On 2 L nasal cannula, crackles in bases, diminished throughout. Abdomen:  soft, ntnd, NABS Skin:  no rash or induration seen on limited exam Musculoskeletal: Left transmetatarsal amputation noted. No other bony upper body is appreciated. Full range of motion. Psychiatric:  grossly normal mood and affect, speech fluent and appropriate, AOx3 Neurologic:  CN 2-12 grossly intact, moves all extremities in coordinated fashion, sensation intact  Labs on Admission: I have personally reviewed following labs and imaging studies  CBC:  Recent Labs Lab 09/14/16 0809  WBC 16.3*  NEUTROABS 13.8*  HGB 14.2  HCT 42.8  MCV 80.9  PLT 122   Basic Metabolic Panel:  Recent Labs Lab 09/09/16 0832 09/14/16 0809  NA 140 139  K 4.1 3.6  CL 103 103  CO2 24 27  GLUCOSE 170* 287*  BUN 13 15  CREATININE 1.45* 1.79*  CALCIUM 8.5* 9.1   GFR: Estimated Creatinine Clearance: 37.7 mL/min (A) (by C-G formula based on SCr of 1.79 mg/dL (H)). Liver  Function Tests:  Recent Labs Lab 09/14/16 0809  AST 25  ALT 21  ALKPHOS 131*  BILITOT 0.4  PROT 8.1  ALBUMIN 3.2*    Recent Labs Lab 09/14/16 0809  LIPASE 28   No results for input(s): AMMONIA in the last 168 hours. Coagulation Profile: No results for input(s): INR, PROTIME in the last 168 hours. Cardiac Enzymes:  Recent Labs Lab 09/14/16 0809  TROPONINI 1.05*   BNP (last 3 results) No results for input(s): PROBNP in the last 8760 hours. HbA1C: No results for input(s): HGBA1C in the last 72 hours. CBG:  Recent Labs Lab 09/14/16 1406  GLUCAP 130*   Lipid Profile: No results for input(s): CHOL, HDL, LDLCALC, TRIG, CHOLHDL, LDLDIRECT in the last 72 hours. Thyroid Function Tests: No results for input(s): TSH, T4TOTAL, FREET4, T3FREE, THYROIDAB in the last 72 hours. Anemia Panel: No results for input(s): VITAMINB12, FOLATE, FERRITIN, TIBC, IRON, RETICCTPCT in the last 72 hours. Urine analysis:    Component Value Date/Time   COLORURINE YELLOW 07/13/2015 0522   APPEARANCEUR CLEAR 07/13/2015 0522   LABSPEC 1.009 07/13/2015 0522   PHURINE 7.0 07/13/2015 0522   GLUCOSEU NEGATIVE 07/13/2015 0522   HGBUR NEGATIVE 07/13/2015 0522   BILIRUBINUR NEGATIVE 07/13/2015 0522   KETONESUR NEGATIVE 07/13/2015 0522   PROTEINUR NEGATIVE 07/13/2015 0522   NITRITE NEGATIVE 07/13/2015 0522   LEUKOCYTESUR NEGATIVE 07/13/2015 0522    Creatinine Clearance: Estimated Creatinine Clearance: 37.7 mL/min (A) (by C-G formula based on SCr of 1.79 mg/dL (H)).  Sepsis Labs: _0 (procalcitonin:4,lacticidven:4) )No results found for this or any previous visit (from the past 240 hour(s)).   Radiological Exams on Admission: Dg Chest Port 1 View  Result Date: 09/14/2016 CLINICAL DATA:  Shortness of breath, abdominal pain, headache EXAM: PORTABLE CHEST 1 VIEW COMPARISON:  CT chest dated 04/30/2009 FINDINGS: Multifocal interstitial/ patchy opacities, favoring mild to moderate  interstitial edema, although multifocal pneumonia is possible. No definite pleural effusions. No pneumothorax. The heart is normal in size. IMPRESSION: Multifocal patchy opacities, possibly reflecting mild to moderate interstitial edema, multifocal pneumonia not excluded. Electronically Signed   By: Julian Hy M.D.   On: 09/14/2016 08:25    EKG: Independently reviewed. Sinus. No ACS  Assessment/Plan Active Problems:   New onset of congestive heart failure (HCC)   Acute  hypoxia respiratory failure: Likely secondary to new onset CHF with acute decompensation. Doubt infectious process such as pneumonia due to lack of typical infectious symptoms, fever, white count. Chest x-rays above. Patient weaned off BiPAP after receiving a diuretic and nitro drip in the ED. Suspect patient's heart failure likely secondary to nonischemic cardiomyopathy secondary to poorly controlled hypertension and diabetes with recent marked hypertensive emergency. - Echo  - I/O, daily wts - Cardiology evaluation - Lasix 90m IV BID - Procalcitonin for completeness  Elevated troponin: 1.05. Suspect secondary to demand. EKG w/o ACS.  - cycle trop - Tele - treatment of CHF as above - contoniue ASA, Statin, Plavix  AoCKD: Cr 1.79. Baseline 1.2. Secondary to uncontrolled hypertension and diabetes with acute decompensated CHF. Renal function may actually improve with diuresis there may be some short-term strain. - BMP in am  PAD: s/p L transmetatarsal amputation - Continue ASA, statin, Plavix as above  HTN: - continue Imdur, lisinopril, coreg, norvasc  DM: poorly controlled.  - A1c - SSI  Neuropathy: - continue neurontin  DVT prophylaxis: hep  Code Status: full  Family Communication: none  Disposition Plan: sdu Consults called: cardiology  Admission status: inpt    Mayelin Panos J MD [facility]  If 7PM-7AM, please contact night-coverage [email or web address] Password [email or web address]  09/14/2016, 3:59 PM

## 2016-09-14 NOTE — ED Provider Notes (Addendum)
[facility] DEPT [facility] Provider Note   CSN: [id number] Arrival date & time: 09/14/16  0747     History   Chief Complaint Chief Complaint  Patient presents with  . Abdominal Pain  . Headache  . Shortness of Breath    HPI Rebekah Johnson is a 56 y.o. female.  Patient is a 56 year old female with past medical history of diabetes, hypertension presenting today with abrupt onset of shortness of breath approximately 7 hours ago. Patient is having difficult time breathing and so cannot answer all questions due to discomfort. She does complain of bilateral lower extremity swelling but is unclear how long this has been going on. Unsure if she has had any weight gain. She has also had left upper quadrant pain which started after the shortness of breath and some vomiting. She denies any fever, productive cough or diarrhea. No known history of AAA, or lung disease. Patient denies ever using inhalers but does admit to smoking. She denies anything like this happening in the past. She is not taking any medications this morning. Lying down makes the shortness of breath significantly worse.   The history is provided by the patient. The history is limited by the condition of the patient.  Abdominal Pain   Associated symptoms include vomiting and headaches. Pertinent negatives include fever.  Headache   Associated symptoms include shortness of breath and vomiting. Pertinent negatives include no fever.  Shortness of Breath  This is a new problem. The average episode lasts 7 hours. The problem occurs continuously.The problem has been gradually worsening. Associated symptoms include headaches, cough, vomiting, abdominal pain and leg swelling. Pertinent negatives include no fever, no sputum production, no wheezing and no chest pain. It is unknown what precipitated the problem. She has tried nothing for the symptoms. The treatment provided no relief. Associated medical issues comments: htn, diabetes, pad.     Past Medical History:  Diagnosis Date  . Diabetes mellitus without complication ([place])   . High cholesterol   . Hypertension     Patient Active Problem List   Diagnosis Date Noted  . Onychomycosis 12/24/2015  . Status post transmetatarsal amputation of foot, left ([place]) 09/06/2015  . Diabetic foot ulcer ([place]) 07/13/2015  . AKI (acute kidney injury) ([place]) 07/13/2015  . Cellulitis 07/12/2015  . HLD (hyperlipidemia) 09/02/2012  . Peripheral nerve disease 09/02/2012  . Type 2 diabetes mellitus with peripheral angiopathy ([place]) 09/02/2012  . Hypertriglyceridemia 07/28/2012  . Peripheral vascular disease ([place]) 07/28/2012  . Avitaminosis D 07/28/2012  . Essential (primary) hypertension 06/10/2012  . History of biliary T-tube placement 06/10/2012  . Leg pain 06/10/2012  . Current tobacco use 06/10/2012  . Atrophic vaginitis 06/07/2012  . Type 2 diabetes mellitus ([place]) 06/07/2012    Past Surgical History:  Procedure Laterality Date  . ABDOMINAL HYSTERECTOMY    . AMPUTATION Left 09/06/2015   Procedure: LEFT TRANSMETATARSAL AMPUTATTION;  Surgeon: Newt Minion, MD;  Location: [facility];  Service: Orthopedics;  Laterality: Left;  . PERIPHERAL VASCULAR CATHETERIZATION N/A 08/16/2015   Procedure: Abdominal Aortogram;  Surgeon: Elam Dutch, MD;  Location: [facility];  Service: Cardiovascular;  Laterality: N/A;  . PERIPHERAL VASCULAR CATHETERIZATION Bilateral 08/16/2015   Procedure: Lower Extremity Angiography;  Surgeon: Elam Dutch, MD;  Location: [facility];  Service: Cardiovascular;  Laterality: Bilateral;  . PERIPHERAL VASCULAR CATHETERIZATION Left 08/16/2015   Procedure: Peripheral Vascular Intervention;  Surgeon: Elam Dutch, MD;  Location: [facility];  Service: Cardiovascular;  Laterality: Left;  SFA STENT X 2    OB History    No data available       Home Medications    Prior to Admission medications   Medication Sig Start Date End Date  Taking? Authorizing Provider  amLODipine (NORVASC) 10 MG tablet Take 1 tablet (10 mg total) by mouth daily. Reported on 07/13/2015 09/02/16 09/02/17  Arnoldo Morale, MD  aspirin 81 MG EC tablet Take 1 tablet (81 mg total) by mouth daily. Reported on 07/13/2015 07/02/16   Arnoldo Morale, MD  atorvastatin (LIPITOR) 40 MG tablet Take 1 tablet (40 mg total) by mouth daily. Reported on 07/13/2015 09/02/16   Arnoldo Morale, MD  blood glucose meter kit and supplies KIT Dispense based on patient and insurance preference. Use up to four times daily as directed. (FOR ICD-9 250.00, 250.01). 07/25/15   Ward, Delice Bison, DO  Blood Glucose Monitoring Suppl (ACCU-CHEK AVIVA) device Use as instructed three times daily. 04/22/16   Arnoldo Morale, MD  carvedilol (COREG) 25 MG tablet Take 1 tablet (25 mg total) by mouth 2 (two) times daily with a meal. 09/02/16   Arnoldo Morale, MD  Cholecalciferol (VITAMIN D-3) 1000 units CAPS Take 1 capsule by mouth daily.    [provider]  clopidogrel (PLAVIX) 75 MG tablet Take 1 tablet (75 mg total) by mouth daily. 09/02/16   Arnoldo Morale, MD  docusate sodium (COLACE) 100 MG capsule TAKE 1 CAPSULE BY MOUTH TWICE A DAY 08/26/16   Arnoldo Morale, MD  gabapentin (NEURONTIN) 300 MG capsule Take 2 capsules (600 mg total) by mouth 3 (three) times daily as needed (neuropathic pain). 09/02/16   Arnoldo Morale, MD  glipiZIDE (GLUCOTROL) 10 MG tablet Take 1 tablet (10 mg total) by mouth 2 (two) times daily before a meal. 09/02/16   Arnoldo Morale, MD  glucose blood (ACCU-CHEK AVIVA) test strip Use as instructed three times daily before meals. 04/22/16   Arnoldo Morale, MD  isosorbide mononitrate (IMDUR) 60 MG 24 hr tablet Take 1 tablet (60 mg total) by mouth daily. 09/02/16   Arnoldo Morale, MD  Lancet Devices [facility]) lancets Use as instructed three times daily before meals. 04/22/16   Arnoldo Morale, MD  lisinopril (PRINIVIL,ZESTRIL) 20 MG tablet Take 1 tablet (20 mg total) by mouth daily. 09/02/16    Arnoldo Morale, MD  Omega-3 Fatty Acids (FISH OIL) 1000 MG CAPS Take 1 capsule by mouth daily. Reported on 07/13/2015    [provider]  sitaGLIPtin (JANUVIA) 50 MG tablet Take 1 tablet (50 mg total) by mouth daily. 09/02/16   Arnoldo Morale, MD    Family History Family History  Problem Relation Age of Onset  . Diabetes Mother   . Hypertension Father     Social History Social History  Substance Use Topics  . Smoking status: Current Every Day Smoker    Packs/day: 0.25    Types: Cigarettes  . Smokeless tobacco: Never Used     Comment: 1 pk last 1 1/2 days  . Alcohol use 0.6 - 1.2 oz/week    1 - 2 Glasses of wine per week     Comment: on social occassions     Allergies   Patient has no known allergies.   Review of Systems Review of Systems  Constitutional: Negative for fever.  Respiratory: Positive for cough and shortness of breath. Negative for sputum production and wheezing.   Cardiovascular: Positive for leg swelling. Negative for chest pain.  Gastrointestinal: Positive for abdominal pain and  vomiting.  Neurological: Positive for headaches.  All other systems reviewed and are negative.    Physical Exam Updated Vital Signs BP (!) 177/94   Pulse 95   Wt 92.5 kg (204 lb)   SpO2 (!) 76%   BMI 35.02 kg/m   Physical Exam  Constitutional: She is oriented to person, place, and time. She appears well-developed and well-nourished. She appears distressed.  Patient is distressed, grunting, shuffling in the bed, unable to become comfortable  HENT:  Head: Normocephalic and atraumatic.  Mouth/Throat: Oropharynx is clear and moist.  Eyes: Pupils are equal, round, and reactive to light. Conjunctivae and EOM are normal.  Neck: Normal range of motion. Neck supple.  Cardiovascular: Regular rhythm and intact distal pulses.  Tachycardia present.   No murmur heard. Pulmonary/Chest: Tachypnea noted. She is in respiratory distress. She has no wheezes. She has rales in the  right lower field and the left lower field.  Abdominal: Soft. She exhibits no distension. There is no tenderness. There is no rebound and no guarding.  No reproducible abdominal pain  Musculoskeletal: Normal range of motion. She exhibits edema. She exhibits no tenderness.  2+ pitting edema to the mid shin bilaterally.  Prior midfoot amputation on the left.  Well healed without wounds  Neurological: She is alert and oriented to person, place, and time.  Skin: Skin is warm and dry. No rash noted. No erythema.  Psychiatric: She has a normal mood and affect. Her behavior is normal.  Nursing note and vitals reviewed.    ED Treatments / Results  Labs (all labs ordered are listed, but only abnormal results are displayed) Labs Reviewed  CBC WITH DIFFERENTIAL/PLATELET - Abnormal; Notable for the following:       Result Value   WBC 16.3 (*)    RBC 5.29 (*)    Neutro Abs 13.8 (*)    All other components within normal limits  COMPREHENSIVE METABOLIC PANEL - Abnormal; Notable for the following:    Glucose, Bld 287 (*)    Creatinine, Ser 1.79 (*)    Albumin 3.2 (*)    Alkaline Phosphatase 131 (*)    GFR calc non Af Amer 30 (*)    GFR calc Af Amer 35 (*)    All other components within normal limits  BRAIN NATRIURETIC PEPTIDE - Abnormal; Notable for the following:    B Natriuretic Peptide 549.0 (*)    All other components within normal limits  TROPONIN I - Abnormal; Notable for the following:    Troponin I 1.05 (*)    All other components within normal limits  LIPASE, BLOOD    EKG  EKG Interpretation  Date/Time:  Monday September 14 2016 08:10:02 EDT Ventricular Rate:  77 PR Interval:    QRS Duration: 101 QT Interval:  451 QTC Calculation: 511 R Axis:   67 Text Interpretation:  Sinus rhythm Probable left atrial enlargement Nonspecific repol abnormality, lateral leads Prolonged QT interval Left ventricular hypertrophy No significant change since last tracing Confirmed by Blanchie Dessert [phone]) on 09/14/2016 8:22:21 AM       Radiology Dg Chest Port 1 View  Result Date: 09/14/2016 CLINICAL DATA:  Shortness of breath, abdominal pain, headache EXAM: PORTABLE CHEST 1 VIEW COMPARISON:  CT chest dated 04/30/2009 FINDINGS: Multifocal interstitial/ patchy opacities, favoring mild to moderate interstitial edema, although multifocal pneumonia is possible. No definite pleural effusions. No pneumothorax. The heart is normal in size. IMPRESSION: Multifocal patchy opacities, possibly reflecting mild to moderate interstitial edema, multifocal pneumonia  not excluded. Electronically Signed   By: Julian Hy M.D.   On: 09/14/2016 08:25    Procedures Procedures (including critical care time)  Medications Ordered in ED Medications  nitroGLYCERIN 50 mg in dextrose 5 % 250 mL (0.2 mg/mL) infusion (not administered)     Initial Impression / Assessment and Plan / ED Course  I have reviewed the triage vital signs and the nursing notes.  Pertinent labs & imaging results that were available during my care of the patient were reviewed by me and considered in my medical decision making (see chart for details).     Patient presenting today in respiratory distress. Patient's oxygen saturation was 76% on room air with tachypnea, rales in the lower lobes and new lower extremity swelling. Patient is complaining of left upper quadrant abdominal pain but is nonreproducible. She is also hypertensive.  Concern for CHF with flash pulmonary edema as patient states symptoms came on suddenly. She is a tobacco abuser but denies any prior history of respiratory pathology such as COPD. She does not use inhalers regularly. No prior history of kidney disease. She does see her PCP on a regular basis. Lower suspicion for AAA as would expect patient would be hypotensive. Also lower suspicion for PE (no travel, unilateral leg swelling, prior hx or hormone use). Lower suspicion for dissection.  Pulses are  equal in bilateral lower extremities. Due to hypoxia and discomfort patient was placed on BiPAP with significant improvement in oxygen saturation to 99% in improvement in patient's discomfort. She now is not complaining of abdominal pain.  Patient repeat blood pressure is 178/87. Will start on a nitroglycerin drip.   CBC, CMP, troponin, EKG, chest x-ray, BNP pending. Patient will require admission.  9:53 AM Labs consistent with CHF and flash pulmonary edema. Troponin leak of 1. Chest x-ray with interstitial edema. Patient has no symptoms suggestive of infection. Patient was given Lasix 20 mg IV. She was also given her home blood pressure medications.  Patient will be admitted to stepdown.   10:33 AM Spoke with Dr. Loletha Grayer with cardiology who will see the pt on arrival.  CRITICAL CARE Performed by: Moana Munford Total critical care time: 45 minutes Critical care time was exclusive of separately billable procedures and treating other patients. Critical care was necessary to treat or prevent imminent or life-threatening deterioration. Critical care was time spent personally by me on the following activities: development of treatment plan with patient and/or surrogate as well as nursing, discussions with consultants, evaluation of patient's response to treatment, examination of patient, obtaining history from patient or surrogate, ordering and performing treatments and interventions, ordering and review of laboratory studies, ordering and review of radiographic studies, pulse oximetry and re-evaluation of patient's condition.   Final Clinical Impressions(s) / ED Diagnoses   Final diagnoses:  Acute congestive heart failure, unspecified heart failure type ([place])  Acute pulmonary edema [facility])    New Prescriptions New Prescriptions   No medications on file     Blanchie Dessert, MD 09/14/16 7124    Blanchie Dessert, MD 09/14/16 [id number]

## 2016-09-14 NOTE — Progress Notes (Signed)
Patient arrived via [facility] on BiPAP. Patient in no distress at this time and placed on 4 Lpm nasal cannula. Patient vitals are WNL. Sats 96%. Patient resting comfortably. RN to call if patient becomes SOB.

## 2016-09-14 NOTE — ED Triage Notes (Signed)
Pt having sob, abdominal pain and headache since midnight.

## 2016-09-14 NOTE — ED Notes (Addendum)
Coreg not available in our pyxis

## 2016-09-14 NOTE — Progress Notes (Addendum)
CRITICAL VALUE ALERT  Critical Value:  Trop 2.00. Up from 1.05  Date & Time Notied:  09/14/2016 1030  Provider Notified: [place]   Orders Received/Actions taken:  EKG obtained and placed on chart. Patient without chest pain. Vitals stable.   Awaiting additional orders.   [facility]) and Cards (Dr. Eula Fried). No additional orders at this time. Will continue to monitor the patient.

## 2016-09-14 NOTE — Consult Note (Signed)
Cardiology Consultation:   Patient ID: Rebekah Johnson; [id number]; 1960/10/24   Admit date: 09/14/2016 Date of Consult: 09/14/2016  Primary Care Provider: Arnoldo Morale, MD Primary Cardiologist: New patient  Primary Electrophysiologist:  n/a  Patient Profile:   Rebekah Johnson is a 56 y.o. female with a hx of Hypertension medication noncompliance who is being seen today for the evaluation of CHF at the request of Dr Marily Memos.  History of Present Illness:   Ms. Rebekah Johnson is a 56 year old female with a history of type 2 diabetes mellitus (A1c 9.4), diabetic neuropathy, peripheral vascular disease (status post left SFA and popliteal stent and left SFA PTA with drug coated balloon in 08/2015), Left foot transmetatarsal amputation,  tobacco abuse, hypertension who presents today for follow-up visit. Her A1c has trended up from 8.0 to 9.4 and she endorses compliance with her medications. She has not adhered to a diabetic diet and has been drinking a lot of Pepsi. Does not exercise either.  She states that last Wednesday she ran out of all of her medications and was for 3 days without any meds when she developed progressively worsening dyspnea on exertion, two-pillow orthopnea and paroxysmal nocturnal dyspnea. She has only mild lower extremity edema. She also developed episodes of diaphoresis but no chest pain. No palpitations or syncope.  Past Medical History:  Diagnosis Date  . Diabetes mellitus without complication ([place])   . High cholesterol   . Hypertension     Past Surgical History:  Procedure Laterality Date  . ABDOMINAL HYSTERECTOMY    . AMPUTATION Left 09/06/2015   Procedure: LEFT TRANSMETATARSAL AMPUTATTION;  Surgeon: Newt Minion, MD;  Location: [facility];  Service: Orthopedics;  Laterality: Left;  . PERIPHERAL VASCULAR CATHETERIZATION N/A 08/16/2015   Procedure: Abdominal Aortogram;  Surgeon: Elam Dutch, MD;  Location: [facility];  Service: Cardiovascular;  Laterality:  N/A;  . PERIPHERAL VASCULAR CATHETERIZATION Bilateral 08/16/2015   Procedure: Lower Extremity Angiography;  Surgeon: Elam Dutch, MD;  Location: [facility];  Service: Cardiovascular;  Laterality: Bilateral;  . PERIPHERAL VASCULAR CATHETERIZATION Left 08/16/2015   Procedure: Peripheral Vascular Intervention;  Surgeon: Elam Dutch, MD;  Location: [facility];  Service: Cardiovascular;  Laterality: Left;  SFA STENT X 2    Inpatient Medications: Scheduled Meds: . amLODipine  10 mg Oral Daily  . carvedilol  25 mg Oral BID WC  . lisinopril  20 mg Oral Daily   Continuous Infusions:  PRN Meds:  . amLODipine  10 mg Oral Daily  . carvedilol  25 mg Oral BID WC  . lisinopril  20 mg Oral Daily    Allergies:   No Known Allergies  Social History:   Social History   Social History  . Marital status: Married    Spouse name: N/A  . Number of children: N/A  . Years of education: N/A   Occupational History  . Not on file.   Social History Main Topics  . Smoking status: Current Every Day Smoker    Packs/day: 0.25    Types: Cigarettes  . Smokeless tobacco: Never Used     Comment: 1 pk last 1 1/2 days  . Alcohol use 0.6 - 1.2 oz/week    1 - 2 Glasses of wine per week     Comment: on social occassions  . Drug use: No  . Sexual activity: Yes    Partners: Male   Other Topics Concern  . Not on file   Social History  Narrative  . No narrative on file    Family History:    Family History  Problem Relation Age of Onset  . Diabetes Mother   . Hypertension Father     ROS:  Please see the history of present illness.  ROS  All other ROS reviewed and negative.     Physical Exam/Data:   Vitals:   09/14/16 1245 09/14/16 1355 09/14/16 1448 09/14/16 1607  BP: 130/70  (!) 164/93 (!) 178/82  Pulse: 67 78  68  Resp: (!) 24 (!) 23  18  Temp:   98.2 F (36.8 C) 98.4 F (36.9 C)  TempSrc:   Oral Oral  SpO2: 95% 96%  98%  Weight:   203 lb 14.8 oz (92.5 kg)     Height:   5\' 4"  (1.626 m)     Intake/Output Summary (Last 24 hours) at 09/14/16 1808 Last data filed at 09/14/16 1130  Gross per 24 hour  Intake                0 ml  Output              150 ml  Net             -150 ml   Filed Weights   09/14/16 0800 09/14/16 1044 09/14/16 1448  Weight: 204 lb (92.5 kg) 204 lb (92.5 kg) 203 lb 14.8 oz (92.5 kg)   Body mass index is 35 kg/m.  General:  Well nourished, well developed, in no acute distress HEENT: normal Lymph: no adenopathy Neck: no JVD Endocrine:  No thryomegaly Vascular: No carotid bruits; FA pulses 2+ bilaterally without bruits  Cardiac:  normal S1, S2; RRR; no murmur  Lungs:  clear to auscultation bilaterally, no wheezing, rhonchi or rales  Abd: soft, nontender, no hepatomegaly  Ext: no edema Musculoskeletal:  No deformities, BUE and BLE strength normal and equal Skin: warm and dry  Neuro:  CNs 2-12 intact, no focal abnormalities noted Psych:  Normal affect   EKG:  The EKG was personally reviewed and demonstrates:  SR, LVH with strain Telemetry:  Telemetry was personally reviewed and demonstrates:  SR  Relevant CV Studies:  Laboratory Data:  Chemistry  Recent Labs Lab 09/09/16 0832 09/14/16 0809  NA 140 139  K 4.1 3.6  CL 103 103  CO2 24 27  GLUCOSE 170* 287*  BUN 13 15  CREATININE 1.45* 1.79*  CALCIUM 8.5* 9.1  GFRNONAA 40* 30*  GFRAA 46* 35*  ANIONGAP  --  9     Recent Labs Lab 09/14/16 0809  PROT 8.1  ALBUMIN 3.2*  AST 25  ALT 21  ALKPHOS 131*  BILITOT 0.4   Hematology  Recent Labs Lab 09/14/16 0809  WBC 16.3*  RBC 5.29*  HGB 14.2  HCT 42.8  MCV 80.9  MCH 26.8  MCHC 33.2  RDW 14.4  PLT 329   Cardiac Enzymes  Recent Labs Lab 09/14/16 0809  TROPONINI 1.05*   No results for input(s): TROPIPOC in the last 168 hours.  BNP  Recent Labs Lab 09/14/16 0809  BNP 549.0*    DDimer No results for input(s): DDIMER in the last 168 hours.  Radiology/Studies:  Dg Chest Port 1  View  Result Date: 09/14/2016 CLINICAL DATA:  Shortness of breath, abdominal pain, headache EXAM: PORTABLE CHEST 1 VIEW COMPARISON:  CT chest dated 04/30/2009 FINDINGS: Multifocal interstitial/ patchy opacities, favoring mild to moderate interstitial edema, although multifocal pneumonia is possible. No definite pleural effusions. No  pneumothorax. The heart is normal in size. IMPRESSION: Multifocal patchy opacities, possibly reflecting mild to moderate interstitial edema, multifocal pneumonia not excluded. Electronically Signed   By: Julian Hy M.D.   On: 09/14/2016 08:25    Assessment and Plan:   1. Acute on chronic CHF - no echo, unknown systolic and diastolic function, we'll obtain an echocardiogram, I suspect that the etiology is uncontrolled hypertension noncompliance. I will starts Lasix 40 mg IV twice a day and follow creatinine closely currently 1.79 from baseline 1.45. We'll follow creatinine closely.  2. Hypertensive heart disease with CHF - the patient was restarted on lisinopril, carvedilol, amlodipine, I will hold lisinopril considering acute renal failure and baseline CKD, I will start hydralazine and Imdur.  3. Medication noncompliance - it seems the patient has poor insight to her disease based also on her endocrinologist note. It will be important to obtain generic tracts are she is able to afford them.   For questions or updates, please contact Faith Please consult [email or web address] for contact info under Cardiology/STEMI. Daytime calls, contact the Day Call APP (6a-8a) or assigned team (Teams A-D) provider (7:30a - 5p). All other daytime calls (7:30-5p), contact the Card Master @ [phone].   Nighttime calls, contact the assigned APP (5p-8p) or MD (6:30p-8p). Overnight calls (8p-6a), contact the on call Fellow @ [phone].   Signed, Ena Dawley, MD  09/14/2016 6:08 PM

## 2016-09-15 ENCOUNTER — Inpatient Hospital Stay (HOSPITAL_COMMUNITY): Payer: Medicaid Other

## 2016-09-15 ENCOUNTER — Other Ambulatory Visit (HOSPITAL_COMMUNITY): Payer: Medicaid Other

## 2016-09-15 DIAGNOSIS — I5031 Acute diastolic (congestive) heart failure: Secondary | ICD-10-CM

## 2016-09-15 DIAGNOSIS — J810 Acute pulmonary edema: Secondary | ICD-10-CM

## 2016-09-15 DIAGNOSIS — I214 Non-ST elevation (NSTEMI) myocardial infarction: Secondary | ICD-10-CM | POA: Diagnosis present

## 2016-09-15 DIAGNOSIS — N183 Chronic kidney disease, stage 3 (moderate): Secondary | ICD-10-CM

## 2016-09-15 DIAGNOSIS — I739 Peripheral vascular disease, unspecified: Secondary | ICD-10-CM

## 2016-09-15 DIAGNOSIS — I340 Nonrheumatic mitral (valve) insufficiency: Secondary | ICD-10-CM

## 2016-09-15 DIAGNOSIS — E876 Hypokalemia: Secondary | ICD-10-CM | POA: Diagnosis present

## 2016-09-15 DIAGNOSIS — E1121 Type 2 diabetes mellitus with diabetic nephropathy: Secondary | ICD-10-CM

## 2016-09-15 DIAGNOSIS — E1165 Type 2 diabetes mellitus with hyperglycemia: Secondary | ICD-10-CM

## 2016-09-15 DIAGNOSIS — N179 Acute kidney failure, unspecified: Secondary | ICD-10-CM

## 2016-09-15 DIAGNOSIS — N189 Chronic kidney disease, unspecified: Secondary | ICD-10-CM

## 2016-09-15 DIAGNOSIS — J9601 Acute respiratory failure with hypoxia: Secondary | ICD-10-CM

## 2016-09-15 DIAGNOSIS — E0842 Diabetes mellitus due to underlying condition with diabetic polyneuropathy: Secondary | ICD-10-CM

## 2016-09-15 LAB — ECHOCARDIOGRAM COMPLETE
Height: 64 in
Weight: 3217.6 oz

## 2016-09-15 LAB — URINALYSIS, ROUTINE W REFLEX MICROSCOPIC
BACTERIA UA: NONE SEEN
Bilirubin Urine: NEGATIVE
GLUCOSE, UA: NEGATIVE mg/dL
HGB URINE DIPSTICK: NEGATIVE
Ketones, ur: NEGATIVE mg/dL
LEUKOCYTES UA: NEGATIVE
NITRITE: NEGATIVE
Specific Gravity, Urine: 1.005 (ref 1.005–1.030)
pH: 7 (ref 5.0–8.0)

## 2016-09-15 LAB — BASIC METABOLIC PANEL
ANION GAP: 8 (ref 5–15)
ANION GAP: 8 (ref 5–15)
BUN: 10 mg/dL (ref 6–20)
BUN: 11 mg/dL (ref 6–20)
CALCIUM: 8 mg/dL — AB (ref 8.9–10.3)
CALCIUM: 8.2 mg/dL — AB (ref 8.9–10.3)
CO2: 28 mmol/L (ref 22–32)
CO2: 30 mmol/L (ref 22–32)
Chloride: 103 mmol/L (ref 101–111)
Chloride: 99 mmol/L — ABNORMAL LOW (ref 101–111)
Creatinine, Ser: 1.62 mg/dL — ABNORMAL HIGH (ref 0.44–1.00)
Creatinine, Ser: 1.75 mg/dL — ABNORMAL HIGH (ref 0.44–1.00)
GFR calc Af Amer: 39 mL/min — ABNORMAL LOW (ref >60)
GFR, EST AFRICAN AMERICAN: 36 mL/min — AB (ref >60)
GFR, EST NON AFRICAN AMERICAN: 34 mL/min — AB (ref >60)
GFR, EST NON AFRICAN AMERICAN: 31 mL/min — AB (ref >60)
GLUCOSE: 138 mg/dL — AB (ref 65–99)
Glucose, Bld: 238 mg/dL — ABNORMAL HIGH (ref 65–99)
POTASSIUM: 3.2 mmol/L — AB (ref 3.5–5.1)
Potassium: 2.8 mmol/L — ABNORMAL LOW (ref 3.5–5.1)
SODIUM: 139 mmol/L (ref 135–145)
Sodium: 137 mmol/L (ref 135–145)

## 2016-09-15 LAB — HEPARIN LEVEL (UNFRACTIONATED): Heparin Unfractionated: 0.27 IU/mL — ABNORMAL LOW (ref 0.30–0.70)

## 2016-09-15 LAB — CBC
HCT: 33.7 % — ABNORMAL LOW (ref 36.0–46.0)
Hemoglobin: 10.7 g/dL — ABNORMAL LOW (ref 12.0–15.0)
MCH: 26.2 pg (ref 26.0–34.0)
MCHC: 31.8 g/dL (ref 30.0–36.0)
MCV: 82.4 fL (ref 78.0–100.0)
PLATELETS: 238 10*3/uL (ref 150–400)
RBC: 4.09 MIL/uL (ref 3.87–5.11)
RDW: 13.8 % (ref 11.5–15.5)
WBC: 10.8 10*3/uL — ABNORMAL HIGH (ref 4.0–10.5)

## 2016-09-15 LAB — TROPONIN I
TROPONIN I: 1.73 ng/mL — AB (ref <0.03)
Troponin I: 1.84 ng/mL (ref <0.03)

## 2016-09-15 LAB — GLUCOSE, CAPILLARY
GLUCOSE-CAPILLARY: 221 mg/dL — AB (ref 65–99)
GLUCOSE-CAPILLARY: 230 mg/dL — AB (ref 65–99)
Glucose-Capillary: 194 mg/dL — ABNORMAL HIGH (ref 65–99)
Glucose-Capillary: 237 mg/dL — ABNORMAL HIGH (ref 65–99)

## 2016-09-15 LAB — OCCULT BLOOD X 1 CARD TO LAB, STOOL: Fecal Occult Bld: NEGATIVE

## 2016-09-15 MED ORDER — HEPARIN BOLUS VIA INFUSION
4000.0000 [IU] | Freq: Once | INTRAVENOUS | Status: AC
  Administered 2016-09-15: 4000 [IU] via INTRAVENOUS
  Filled 2016-09-15: qty 4000

## 2016-09-15 MED ORDER — LIVING BETTER WITH HEART FAILURE BOOK
Freq: Once | Status: AC
  Administered 2016-09-15: 10:00:00

## 2016-09-15 MED ORDER — POTASSIUM CHLORIDE CRYS ER 20 MEQ PO TBCR
40.0000 meq | EXTENDED_RELEASE_TABLET | ORAL | Status: AC
  Administered 2016-09-15 (×2): 40 meq via ORAL
  Filled 2016-09-15 (×2): qty 2

## 2016-09-15 MED ORDER — POTASSIUM CHLORIDE CRYS ER 20 MEQ PO TBCR
40.0000 meq | EXTENDED_RELEASE_TABLET | Freq: Two times a day (BID) | ORAL | Status: DC
  Administered 2016-09-15 – 2016-09-16 (×2): 40 meq via ORAL
  Filled 2016-09-15 (×2): qty 2

## 2016-09-15 MED ORDER — POTASSIUM CHLORIDE CRYS ER 20 MEQ PO TBCR: 40.0000 meq | EXTENDED_RELEASE_TABLET | ORAL | Status: DC

## 2016-09-15 MED ORDER — INSULIN GLARGINE 100 UNIT/ML ~~LOC~~ SOLN
8.0000 [IU] | Freq: Every day | SUBCUTANEOUS | Status: DC
  Administered 2016-09-16: 8 [IU] via SUBCUTANEOUS
  Filled 2016-09-15 (×3): qty 0.08

## 2016-09-15 MED ORDER — PERFLUTREN LIPID MICROSPHERE
1.0000 mL | INTRAVENOUS | Status: AC | PRN
  Administered 2016-09-15: 2 mL via INTRAVENOUS
  Filled 2016-09-15: qty 10

## 2016-09-15 MED ORDER — HEPARIN (PORCINE) IN NACL 100-0.45 UNIT/ML-% IJ SOLN
1400.0000 [IU]/h | INTRAMUSCULAR | Status: DC
  Administered 2016-09-15: 1100 [IU]/h via INTRAVENOUS
  Administered 2016-09-16 – 2016-09-17 (×2): 1250 [IU]/h via INTRAVENOUS
  Filled 2016-09-15 (×3): qty 250

## 2016-09-15 NOTE — Progress Notes (Signed)
  Echocardiogram 2D Echocardiogram has been performed.  Jennette Dubin 09/15/2016, 9:57 AM

## 2016-09-15 NOTE — Progress Notes (Signed)
ANTICOAGULATION CONSULT NOTE  Pharmacy Consult for Heparin Indication: NSTEMI  No Known Allergies  Patient Measurements: Height: 5\' 4"  (162.6 cm) Weight: 201 lb 1.6 oz (91.2 kg) IBW/kg (Calculated) : 54.7 Heparin Dosing Weight: 75.6 kg  Vital Signs: Temp: 98.1 F (36.7 C) (09/11 1533) Temp Source: Oral (09/11 1533) BP: 133/66 (09/11 1533)  Labs:  Recent Labs  09/14/16 0809 09/14/16 1906 09/15/16 0016 09/15/16 0649 09/15/16 1442 09/15/16 1632  HGB 14.2  --   --  10.7*  --   --   HCT 42.8  --   --  33.7*  --   --   PLT 329  --   --  238  --   --   HEPARINUNFRC  --   --   --   --   --  0.27*  CREATININE 1.79*  --   --  1.62* 1.75*  --   TROPONINI 1.05* 2.00* 1.73* 1.84*  --   --     Estimated Creatinine Clearance: 38.3 mL/min (A) (by C-G formula based on SCr of 1.75 mg/dL (H)).    Assessment: 56 yo F with hx medication noncompliance (missed 3d of meds, ran out of prescriptions) and developed worsening dyspnea, orthopnea, LE edema.  Troponin elevated and pharmacy asked to initiate heparin.  Likely cardiac cath this admission.  Initial heparin level = 0.27  Goal of Therapy:  Heparin level 0.3-0.7 units/ml Monitor platelets by anticoagulation protocol: Yes   Plan:  Increase heparin to 1250 units / hr Follow up AM labs  Thank you Anette Guarneri, PharmD [phone] 09/15/2016 6:33 PM

## 2016-09-15 NOTE — Progress Notes (Signed)
ANTICOAGULATION CONSULT NOTE - Initial Consult  Pharmacy Consult for Heparin Indication: NSTEMI  No Known Allergies  Patient Measurements: Height: 5\' 4"  (162.6 cm) Weight: 201 lb 1.6 oz (91.2 kg) IBW/kg (Calculated) : 54.7 Heparin Dosing Weight: 75.6 kg  Vital Signs: Temp: 99.6 F (37.6 C) (09/11 0439) Temp Source: Oral (09/11 0439) BP: 162/79 (09/11 0857) Pulse Rate: 59 (09/11 0439)  Labs:  Recent Labs  09/14/16 0809 09/14/16 1906 09/15/16 0016 09/15/16 0649  HGB 14.2  --   --  10.7*  HCT 42.8  --   --  33.7*  PLT 329  --   --  238  CREATININE 1.79*  --   --  1.62*  TROPONINI 1.05* 2.00* 1.73* 1.84*    Estimated Creatinine Clearance: 41.4 mL/min (A) (by C-G formula based on SCr of 1.62 mg/dL (H)).   Medical History: Past Medical History:  Diagnosis Date  . Diabetes mellitus without complication ([place])   . High cholesterol   . Hypertension     Medications:  Scheduled:  . amLODipine  10 mg Oral Daily  . aspirin EC  81 mg Oral Daily  . atorvastatin  40 mg Oral Daily  . carvedilol  25 mg Oral BID WC  . clopidogrel  75 mg Oral Daily  . furosemide  40 mg Intravenous BID  . gabapentin  300 mg Oral TID  . heparin  5,000 Units Subcutaneous Q8H  . hydrALAZINE  25 mg Oral QID  . insulin aspart  0-15 Units Subcutaneous TID WC  . insulin aspart  0-5 Units Subcutaneous QHS  . isosorbide mononitrate  60 mg Oral Daily  . Living Better with Heart Failure Book   Does not apply Once  . potassium chloride  40 mEq Oral Q3H   Followed by  . potassium chloride  40 mEq Oral BID  . sodium chloride flush  3 mL Intravenous Q12H   Infusions:  . sodium chloride      Assessment: 56 yo F with hx medication noncompliance (missed 3d of meds, ran out of prescriptions) and developed worsening dyspnea, orthopnea, LE edema.  Troponin elevated and pharmacy asked to initiate heparin.  Likely cardiac cath this admission.  Goal of Therapy:  Heparin level 0.3-0.7 units/ml Monitor  platelets by anticoagulation protocol: Yes   Plan:  D/C SQ Hep - no doses given Heparin 4000 unit IV bolus x 1 Heparin 1100 units/hr infusion Heparin level in 6 hours Heparin level and CBC daily while on heparin   [employer], Pharm.D., BCPS Clinical Pharmacist Pager: [phone] Clinical phone for 09/15/2016 from 8:30-4:00 is [phone]. After 4pm, please call Main Rx [phone]) for assistance. 09/15/2016 10:47 AM

## 2016-09-15 NOTE — Progress Notes (Signed)
Progress Note  Patient Name: Rebekah Johnson Date of Encounter: 09/15/2016  Primary Cardiologist: new to Dr. [name]  Subjective   States she feels significantly better- "I feel back to me." No CP. No SOB. No LEE.  Inpatient Medications    Scheduled Meds: . amLODipine  10 mg Oral Daily  . aspirin EC  81 mg Oral Daily  . atorvastatin  40 mg Oral Daily  . carvedilol  25 mg Oral BID WC  . clopidogrel  75 mg Oral Daily  . furosemide  40 mg Intravenous BID  . gabapentin  300 mg Oral TID  . heparin  5,000 Units Subcutaneous Q8H  . hydrALAZINE  25 mg Oral QID  . insulin aspart  0-15 Units Subcutaneous TID WC  . insulin aspart  0-5 Units Subcutaneous QHS  . isosorbide mononitrate  60 mg Oral Daily  . Living Better with Heart Failure Book   Does not apply Once  . potassium chloride  40 mEq Oral Q3H   Followed by  . potassium chloride  40 mEq Oral BID  . sodium chloride flush  3 mL Intravenous Q12H   Continuous Infusions: . sodium chloride     PRN Meds: sodium chloride, acetaminophen **OR** acetaminophen, ondansetron **OR** ondansetron (ZOFRAN) IV, perflutren lipid microspheres (DEFINITY) IV suspension, sodium chloride flush, traMADol   Vital Signs    Vitals:   09/14/16 2205 09/15/16 0042 09/15/16 0439 09/15/16 0857  BP: (!) 135/52 138/63 (!) 139/57 (!) 162/79  Pulse:  68 (!) 59   Resp:  18 (!) 21   Temp:  99.1 F (37.3 C) 99.6 F (37.6 C)   TempSrc:  Oral Oral   SpO2:  98% 98%   Weight:   201 lb 1.6 oz (91.2 kg)   Height:        Intake/Output Summary (Last 24 hours) at 09/15/16 1031 Last data filed at 09/15/16 0300  Gross per 24 hour  Intake              340 ml  Output              850 ml  Net             -510 ml   Filed Weights   09/14/16 1044 09/14/16 1448 09/15/16 0439  Weight: 204 lb (92.5 kg) 203 lb 14.8 oz (92.5 kg) 201 lb 1.6 oz (91.2 kg)    Telemetry    NSR - Personally Reviewed  Physical Exam   GEN: No acute distress.  HEENT: Normocephalic,  atraumatic, sclera non-icteric. Neck: No JVD or bruits. Cardiac: RRR no murmurs, rubs, or gallops.  Radials/DP/PT 1+ and equal bilaterally.  Respiratory: Diminished BS bilaterally at bases. Breathing is unlabored. GI: Soft, nontender, non-distended, BS +x 4. MS: no deformity. Extremities: No clubbing or cyanosis. No edema. Prior transmetatarsal amputation of left forefoot. Distal pedal pulses are 2+ and equal bilaterally. Neuro:  AAOx3. Follows commands. Psych:  Responds to questions appropriately with a normal affect.  Labs    Chemistry  Recent Labs Lab 09/09/16 [phone] 09/14/16 0809 09/15/16 0649  NA 140 139 139  K 4.1 3.6 2.8*  CL 103 103 103  CO2 '24 27 28  ' GLUCOSE 170* 287* 138*  BUN '13 15 10  ' CREATININE 1.45* 1.79* 1.62*  CALCIUM 8.5* 9.1 8.0*  PROT  --  8.1  --   ALBUMIN  --  3.2*  --   AST  --  25  --   ALT  --  21  --   ALKPHOS  --  131*  --   BILITOT  --  0.4  --   GFRNONAA 40* 30* 34*  GFRAA 46* 35* 39*  ANIONGAP  --  9 8     Hematology  Recent Labs Lab 09/14/16 0809 09/15/16 0649  WBC 16.3* 10.8*  RBC 5.29* 4.09  HGB 14.2 10.7*  HCT 42.8 33.7*  MCV 80.9 82.4  MCH 26.8 26.2  MCHC 33.2 31.8  RDW 14.4 13.8  PLT 329 238    Cardiac Enzymes  Recent Labs Lab 09/14/16 0809 09/14/16 1906 09/15/16 0016 09/15/16 0649  TROPONINI 1.05* 2.00* 1.73* 1.84*   No results for input(s): TROPIPOC in the last 168 hours.   BNP  Recent Labs Lab 09/14/16 0809  BNP 549.0*      Radiology    Dg Chest Port 1 View  Result Date: 09/15/2016 CLINICAL DATA:  Shortness of breath for 3 days. EXAM: PORTABLE CHEST 1 VIEW COMPARISON:  09/14/2016 FINDINGS: The cardiomediastinal silhouette is within normal limits. There is pulmonary vascular congestion with mild diffuse interstitial opacity and mild patchy perihilar/ infrahilar opacity, overall mildly to moderately improved compared to the prior study. No sizable pleural effusion or pneumothorax is identified.  IMPRESSION: Interval improvement of bilateral lung opacities suggesting decreasing edema. Electronically Signed   By: Logan Bores M.D.   On: 09/15/2016 08:08   Dg Chest Port 1 View  Result Date: 09/14/2016 CLINICAL DATA:  Shortness of breath, abdominal pain, headache EXAM: PORTABLE CHEST 1 VIEW COMPARISON:  CT chest dated 04/30/2009 FINDINGS: Multifocal interstitial/ patchy opacities, favoring mild to moderate interstitial edema, although multifocal pneumonia is possible. No definite pleural effusions. No pneumothorax. The heart is normal in size. IMPRESSION: Multifocal patchy opacities, possibly reflecting mild to moderate interstitial edema, multifocal pneumonia not excluded. Electronically Signed   By: Julian Hy M.D.   On: 09/14/2016 08:25    Cardiac Studies   2d echo 09/15/2016  - Left ventricle: The cavity size was normal. Wall thickness was   increased in a pattern of mild LVH. Systolic function was normal.   The estimated ejection fraction was in the range of 55% to 60%.   There is akinesis of the basal-midinferolateral myocardium.   Features are consistent with a pseudonormal left ventricular   filling pattern, with concomitant abnormal relaxation and   increased filling pressure (grade 2 diastolic dysfunction).   Doppler parameters are consistent with high ventricular filling   pressure. - Mitral valve: There was mild regurgitation. - Left atrium: The atrium was mildly dilated.  Impressions:  - Akinesis of the basal/mid inferolateral wall with overall   preserved LV systolic function; moderate diastolic dysfunction;   mild LVH; mild MR; mild LAE.  Patient Profile     56 y.o. female with HTN (patent renal arteries by PV angio 08/2015), uncontrolled DM2, hyperlipidemia, CKD stage III by labs, diabetic neuropathy, PVD (status post left SFA and popliteal stent and left SFA PTA with drug coated balloon in 08/2015), left foot transmetatarsal amputation, tobacco abuse,  obesity admitted with progressive DOE, two pillow orthopnea, PND, mild LEE, diaphoresis but no chest pain - out of medications for 3 days. Found to have acute hypoxic respiratory failure with O2 sats 70s requiring bipap. Prior notes indicate poor insight to disease, poor dietary compliance. Felt to have new onset CHF, also with troponin of 2.  Assessment & Plan    1. New onset congestive heart failure/suspected hypertensive heart disease - 2d echo pending, not  yet known if systolic or diastolic. There is concern for hypertensive heart disease r/t poorly controlled HTN but would also be concerned for underlying CAD given multiple risk factors and elevated troponin this admission. ACEI on hold due to AKI on CKD. Clinically she reports significant improvement. Will review diuretic regimen with Dr. [name] - may be able to change Lasix to oral form this PM. Will rx CHF book and ask dietitian to see for education.  2. NSTEMI - continue aspirin. D/w Dr. [name] -start heparin per pharmacy. Suspect will need cath this admission if creatinine remains stable. Tentatively I discussed with the patient today. Risks and benefits of cardiac catheterization have been discussed with the patient. These include bleeding, infection, kidney damage, stroke, heart attack, death. The patient understands these risks and is willing to proceed, pending further decision regarding timing. F/u lipid panel and LFTs in AM. If alk phos stable and LDL >70 would titrate statin further 103m daily.   3. AKI on CKD stage III - Cr improved with diuresis. Follow daily. Check UA to eval for nephrotic syndrome given hypoalbuminemia.  4. Essential HTN - as above, check UA. BP remains 130s-160s. Continue amlodipine, carvedilol, Lasix, higher dose of Imdur. She just received 2nd dose of hydralazine this AM. Will need to consider titration of BP remains suboptimal, with eventual eye towards TID dosing to improve compliance.  4. Hypokalemia - replete  450m x 2 with repeat BMET later this afternoon - if improving, anticipate 4070mBID as standing dose tonight.  5. Anemia, normocytic - denies bleeding. Prior Hgb in 11 range, yesterday's seemed like an outlier. Denies bleeding. Pt just had BM. I notified tech helping to clean up that we ordered a hemoccult to send stool to test.  Signed, DayBurna Mortimerager [phone]/11/2016, 10:31 AM    The patient was seen, examined and discussed with DayMelina CopaA-C and I agree with the above.   1. Acute on chronic CHF - LVEF 55-60% with grade 2 DD, she is still fluid overloaded, diuresis -500 cc overnight with weight down 3 lbs and good symptoms improvement. I suspect that the etiology is uncontrolled hypertension noncompliance. Continue Lasix 40 mg IV twice a day till tomorrow and follow creatinine closely currently 1.79->1.62 from baseline 1.45. We'll follow creatinine closely.  2. Hypertensive heart disease with CHF - the patient was restarted on lisinopril, carvedilol, amlodipine, I will hold lisinopril considering acute renal failure and baseline CKD, we started hydralazine and Imdur. One isolated measurement this am, otherwise in 130', we will follow.  3. NSTEMI - troponin - 2->1.8, no cath yet as crea elevated, possibly on Thursday  4. Medication noncompliance - it seems the patient has poor insight to her disease based also on her endocrinologist note. It will be important to obtain generic tracts are she is able to afford them.  KatEna DawleyD 09/15/2016

## 2016-09-15 NOTE — Progress Notes (Signed)
Inpatient Diabetes Program Recommendations  AACE/ADA: New Consensus Statement on Inpatient Glycemic Control (2015)  Target Ranges:  Prepandial:   less than 140 mg/dL      Peak postprandial:   less than 180 mg/dL (1-2 hours)      Critically ill patients:  140 - 180 mg/dL   Lab Results  Component Value Date   GLUCAP 221 (H) 09/15/2016   HGBA1C 8.8 (H) 09/14/2016   Spoke to patient regarding her constant refusal of insulin.  Despite my education about standard of care while inpatient, the safety of using insulin, her elevated A1C, she absolutely WILL NOT take insulin while she is here.   Gentry Fitz, RN, BA, MHA, CDE Diabetes Coordinator Inpatient Diabetes Program  [phone] (Team Pager) [phone] ([place]) 09/15/2016 3:52 PM

## 2016-09-15 NOTE — Progress Notes (Signed)
PROGRESS NOTE    Rebekah Johnson  DGL:[id number] DOB: 11-23-1960 DOA: 09/14/2016 PCP: Arnoldo Morale, MD   Brief Narrative:  56 y.o. BF PMHx Birth Control HTN, HLD, PAD S/P vascular recanalization, Diabetes type 2 uncontrolled with complications , LEFT Transmetatarsal amputation.   Patient reports running out of her medications approximate 2 weeks prior to admission. After 2 days she was able to get these refilled. During that today. Patient noted that her blood pressure was running as high as 225/105 and a glucose ran in the 300 range. When she is able to get her medications refilled her blood pressure resumed its normal state with a systolic reading of approximately 170-180. Patient reports 5 days prior to admission developing a short-lived episode of profound diaphoresis and weakness. Patient initially thought that her glucose was too high or too low. She checked her glucose which was 225 which she reports to be somewhat average. Patient had a recurrent episode of her diaphoresis and weakness 4 days prior to admission again this was short-lived. On the day prior to admission patient reports profound persistent shortness of breath, diaphoresis, and headache. Patient states she is unable to sleep the night prior to admission due to a continual sensation of being out of breath. Patient denies any fevers but endorses mild productive cough with whitish phlegm area denies chest pain, nausea, vomiting, rash, diarrhea, dysuria, frequency.   ED Course: Patient found to be profoundly hypoxic with O2 saturations in the 70s. Placed on BiPAP at [facility] prior to transfer. Patient given 20 mg of IV Lasix and her home antihypertensives.    Subjective: 9/11 A/O 4 negative CP, negative SOB, negative N/V, negative abdominal pain. States we are trying to force her to use insulin instead of her PO diabetic medication. States we are trying to force her to have therapy she does not desire.    Assessment  & Plan:   Active Problems:   Essential (primary) hypertension   HLD (hyperlipidemia)   Type 2 diabetes mellitus with peripheral angiopathy (HCC)   AKI (acute kidney injury) ([place])   New onset of congestive heart failure (HCC)   Acute respiratory failure (HCC)   Acute congestive heart failure (HCC)   Acute pulmonary edema (HCC)   Acute Diastolic CHF -multifactorial uncontrolled HTN, uncontrolled diabetes -Echocardiogram consistent with CHF see results below -9/11 PCXR: Consistent with fluid overload -Strict I&O -Daily weight -Per Cardiology: Tentative cardiac catheterizations Thursday if patient's renal function improves - ASA 81 mg daily -Lipitor 40 mg daily: Will need to be titrated up to 80 mg daily, would wait until post cath -Plavix 70 50 g daily -Amlodipine 10 mg daily -Coreg 25 mg BID -Lasix 40 mg  BID -Hydralazine 25 mg QID -Imdur 60 mg daily  NSTEMI -See CHF    Recent Labs Lab 09/14/16 0809 09/14/16 1906 09/15/16 0016 09/15/16 0649  TROPONINI 1.05* 2.00* 1.73* 1.84*   PAD  -S/P LEFT transmetatarsal application -See CHF  Uncontrolled HTN -See CHF   Acute on CKD stageIII(baseline Cr 1.3)  - Counseled patient at length that we were holding her Januvia and glipizide secondary to her acute on chronic renal failure. Can cause/exacerbate CHF, renal failure. Counseled patient at length we are trying to maximize her renal function in order for her to obtain cardiac catheterization.-Patient feels we are trying to force her to use treatments (i.e. insulin) which she does not want to use. Informed patient we could not force her to use insulin however would not administer  her PO diabetic medication as this could worsen her renal failure. Gave her the option of checking out [facility]  Recent Labs Lab 09/09/16 0832 09/14/16 0809 09/15/16 0649 09/15/16 1442  CREATININE 1.45* 1.79* 1.62* 1.75*   Diabetes type 2 uncontrolled with renal complications  -7/89 Hemoglobin A1c=  8.8 -Patient uncontrolled on 2 PO medications per ADA guidelines patient should start on insulin. Unfortunately patient adamantly opposed to insulin even when it's explained to patient that her worsening renal failure precludes her from using PO medication. -9/11 start Lantus 8 units daily -Moderate SSI   Diabetic Neuropathy -Continue Neurontin 300 mg TID     DVT prophylaxis: Heparin drip Code Status: Full Family Communication: None Disposition Plan: TBD   Consultants:  Cardiology    Procedures/Significant Events:  Echocardiogram: LVEF 55-60% akinesis of nasal-mid inferolateral myocardium -Grade 2 diastolic dysfunction    I have personally reviewed and interpreted all radiology studies and my findings are as above.  VENTILATOR SETTINGS: None   Cultures None  Antimicrobials: None   Devices None   LINES / TUBES:      Continuous Infusions: . sodium chloride       Objective: Vitals:   09/14/16 2200 09/14/16 2205 09/15/16 0042 09/15/16 0439  BP:  (!) 135/52 138/63 (!) 139/57  Pulse: 65  68 (!) 59  Resp: 20  18 (!) 21  Temp:   99.1 F (37.3 C) 99.6 F (37.6 C)  TempSrc:   Oral Oral  SpO2: 96%  98% 98%  Weight:    201 lb 1.6 oz (91.2 kg)  Height:        Intake/Output Summary (Last 24 hours) at 09/15/16 0739 Last data filed at 09/15/16 0300  Gross per 24 hour  Intake              340 ml  Output              850 ml  Net             -510 ml   Filed Weights   09/14/16 1044 09/14/16 1448 09/15/16 0439  Weight: 204 lb (92.5 kg) 203 lb 14.8 oz (92.5 kg) 201 lb 1.6 oz (91.2 kg)    Examination:  General: A/O 4, No acute respiratory distress Neck:  Negative scars, masses, torticollis, lymphadenopathy, JVD Lungs: Clear to auscultation bilaterally without wheezes or crackles Cardiovascular: Regular rate and rhythm without murmur gallop or rub normal S1 and S2 Abdomen: Morbidly obese, negative abdominal pain, nondistended, positive soft, bowel  sounds, no rebound, no ascites, no appreciable mass Extremities: No significant cyanosis, clubbing, or edema bilateral lower extremities Skin: Negative rashes, lesions, ulcers Psychiatric:  Negative depression, positive anxiety, negative fatigue, negative mania, EXTREMELY POOR understanding of her multiple medical conditions  Central nervous system:  Cranial nerves II through XII intact, tongue/uvula midline, all extremities muscle strength 5/5, sensation intact throughout,  negative dysarthria, negative expressive aphasia, negative receptive aphasia.  .     Data Reviewed: Care during the described time interval was provided by me .  I have reviewed this patient's available data, including medical history, events of note, physical examination, and all test results as part of my evaluation.   CBC:  Recent Labs Lab 09/14/16 0809  WBC 16.3*  NEUTROABS 13.8*  HGB 14.2  HCT 42.8  MCV 80.9  PLT 381   Basic Metabolic Panel:  Recent Labs Lab 09/09/16 0832 09/14/16 0809 09/14/16 1906  NA 140 139  --   K 4.1 3.6  --  CL 103 103  --   CO2 24 27  --   GLUCOSE 170* 287*  --   BUN 13 15  --   CREATININE 1.45* 1.79*  --   CALCIUM 8.5* 9.1  --   MG  --   --  1.7   GFR: Estimated Creatinine Clearance: 37.5 mL/min (A) (by C-G formula based on SCr of 1.79 mg/dL (H)). Liver Function Tests:  Recent Labs Lab 09/14/16 0809  AST 25  ALT 21  ALKPHOS 131*  BILITOT 0.4  PROT 8.1  ALBUMIN 3.2*    Recent Labs Lab 09/14/16 0809  LIPASE 28   No results for input(s): AMMONIA in the last 168 hours. Coagulation Profile: No results for input(s): INR, PROTIME in the last 168 hours. Cardiac Enzymes:  Recent Labs Lab 09/14/16 0809 09/14/16 1906 09/15/16 0016  TROPONINI 1.05* 2.00* 1.73*   BNP (last 3 results) No results for input(s): PROBNP in the last 8760 hours. HbA1C:  Recent Labs  09/14/16 1906  HGBA1C 8.8*   CBG:  Recent Labs Lab 09/14/16 1406 09/14/16 1606  09/14/16 2005  GLUCAP 130* 124* 214*   Lipid Profile: No results for input(s): CHOL, HDL, LDLCALC, TRIG, CHOLHDL, LDLDIRECT in the last 72 hours. Thyroid Function Tests:  Recent Labs  09/14/16 1906  TSH 0.959   Anemia Panel: No results for input(s): VITAMINB12, FOLATE, FERRITIN, TIBC, IRON, RETICCTPCT in the last 72 hours. Urine analysis:    Component Value Date/Time   COLORURINE YELLOW 07/13/2015 0522   APPEARANCEUR CLEAR 07/13/2015 0522   LABSPEC 1.009 07/13/2015 0522   PHURINE 7.0 07/13/2015 0522   GLUCOSEU NEGATIVE 07/13/2015 0522   HGBUR NEGATIVE 07/13/2015 0522   BILIRUBINUR NEGATIVE 07/13/2015 0522   KETONESUR NEGATIVE 07/13/2015 0522   PROTEINUR NEGATIVE 07/13/2015 0522   NITRITE NEGATIVE 07/13/2015 0522   LEUKOCYTESUR NEGATIVE 07/13/2015 0522   Sepsis Labs: @LABRCNTIP (procalcitonin:4,lacticidven:4)  )No results found for this or any previous visit (from the past 240 hour(s)).       Radiology Studies: Dg Chest Port 1 View  Result Date: 09/14/2016 CLINICAL DATA:  Shortness of breath, abdominal pain, headache EXAM: PORTABLE CHEST 1 VIEW COMPARISON:  CT chest dated 04/30/2009 FINDINGS: Multifocal interstitial/ patchy opacities, favoring mild to moderate interstitial edema, although multifocal pneumonia is possible. No definite pleural effusions. No pneumothorax. The heart is normal in size. IMPRESSION: Multifocal patchy opacities, possibly reflecting mild to moderate interstitial edema, multifocal pneumonia not excluded. Electronically Signed   By: Julian Hy M.D.   On: 09/14/2016 08:25        Scheduled Meds: . amLODipine  10 mg Oral Daily  . aspirin EC  81 mg Oral Daily  . atorvastatin  40 mg Oral Daily  . carvedilol  25 mg Oral BID WC  . clopidogrel  75 mg Oral Daily  . furosemide  40 mg Intravenous BID  . gabapentin  300 mg Oral TID  . heparin  5,000 Units Subcutaneous Q8H  . hydrALAZINE  25 mg Oral QID  . insulin aspart  0-15 Units  Subcutaneous TID WC  . insulin aspart  0-5 Units Subcutaneous QHS  . isosorbide mononitrate  60 mg Oral Daily  . sodium chloride flush  3 mL Intravenous Q12H   Continuous Infusions: . sodium chloride       LOS: 1 day    Time spent: 40 minutes    WOODS, Geraldo Docker, MD [facility] Pager [phone]   If 7PM-7AM, please contact night-coverage [email or web address] Password [facility] 09/15/2016, 7:39 AM

## 2016-09-15 NOTE — Progress Notes (Signed)
K 3.2 but it appears patient didn't get KCl until 1256, with repeat dose due now. Will continue plan for 1600 dose, along with 2200 dose.  Dayna Dunn PA-C

## 2016-09-15 NOTE — Progress Notes (Signed)
Inpatient Diabetes Program Recommendations  AACE/ADA: New Consensus Statement on Inpatient Glycemic Control (2015)  Target Ranges:  Prepandial:   less than 140 mg/dL      Peak postprandial:   less than 180 mg/dL (1-2 hours)      Critically ill patients:  140 - 180 mg/dL   Lab Results  Component Value Date   GLUCAP 221 (H) 09/15/2016   HGBA1C 8.8 (H) 09/14/2016    Review of Glycemic Control  Results for Rebekah Johnson, Rebekah Johnson (MRN [id number]) as of 09/15/2016 12:13  Ref. Range 09/14/2016 14:06 09/14/2016 16:06 09/14/2016 20:05 09/15/2016 08:50 09/15/2016 11:54  Glucose-Capillary Latest Ref Range: 65 - 99 mg/dL 130 (H) 124 (H) 214 (H) 194 (H) 221 (H)    Diabetes history: Type 2 Outpatient Diabetes medications: Glipizide 10mg  bid, Januvia 50mg  bid Current orders for Inpatient glycemic control: Novolog 0-15 units tid, Novolog 0-5 units qhs  Inpatient Diabetes Program Recommendations: Agree with current medications for blood sugar management.   Gentry Fitz, RN, BA, MHA, CDE Diabetes Coordinator Inpatient Diabetes Program  [phone] (Team Pager) [phone] ([place]) 09/15/2016 12:18 PM

## 2016-09-16 DIAGNOSIS — E785 Hyperlipidemia, unspecified: Secondary | ICD-10-CM

## 2016-09-16 LAB — LIPID PANEL
Cholesterol: 93 mg/dL (ref 0–200)
HDL: 25 mg/dL — ABNORMAL LOW (ref >40)
LDL Cholesterol: 35 mg/dL (ref 0–99)
Total CHOL/HDL Ratio: 3.7 RATIO
Triglycerides: 163 mg/dL — ABNORMAL HIGH (ref <150)
VLDL: 33 mg/dL (ref 0–40)

## 2016-09-16 LAB — BASIC METABOLIC PANEL
Anion gap: 3 — ABNORMAL LOW (ref 5–15)
BUN: 10 mg/dL (ref 6–20)
CALCIUM: 8.2 mg/dL — AB (ref 8.9–10.3)
CHLORIDE: 106 mmol/L (ref 101–111)
CO2: 28 mmol/L (ref 22–32)
CREATININE: 1.67 mg/dL — AB (ref 0.44–1.00)
GFR calc non Af Amer: 33 mL/min — ABNORMAL LOW (ref >60)
GFR, EST AFRICAN AMERICAN: 38 mL/min — AB (ref >60)
GLUCOSE: 187 mg/dL — AB (ref 65–99)
Potassium: 4 mmol/L (ref 3.5–5.1)
Sodium: 137 mmol/L (ref 135–145)

## 2016-09-16 LAB — CBC
HEMATOCRIT: 34.7 % — AB (ref 36.0–46.0)
Hemoglobin: 11 g/dL — ABNORMAL LOW (ref 12.0–15.0)
MCH: 25.9 pg — AB (ref 26.0–34.0)
MCHC: 31.7 g/dL (ref 30.0–36.0)
MCV: 81.8 fL (ref 78.0–100.0)
Platelets: 209 10*3/uL (ref 150–400)
RBC: 4.24 MIL/uL (ref 3.87–5.11)
RDW: 13.8 % (ref 11.5–15.5)
WBC: 9.1 10*3/uL (ref 4.0–10.5)

## 2016-09-16 LAB — HEPATIC FUNCTION PANEL
ALBUMIN: 2.2 g/dL — AB (ref 3.5–5.0)
ALT: 14 U/L (ref 14–54)
AST: 16 U/L (ref 15–41)
Alkaline Phosphatase: 79 U/L (ref 38–126)
Bilirubin, Direct: <0.1 mg/dL — ABNORMAL LOW (ref 0.1–0.5)
TOTAL PROTEIN: 5.8 g/dL — AB (ref 6.5–8.1)
Total Bilirubin: 0.5 mg/dL (ref 0.3–1.2)

## 2016-09-16 LAB — GLUCOSE, CAPILLARY
GLUCOSE-CAPILLARY: 167 mg/dL — AB (ref 65–99)
GLUCOSE-CAPILLARY: 175 mg/dL — AB (ref 65–99)
GLUCOSE-CAPILLARY: 146 mg/dL — AB (ref 65–99)
GLUCOSE-CAPILLARY: 200 mg/dL — AB (ref 65–99)

## 2016-09-16 LAB — MAGNESIUM: MAGNESIUM: 1.7 mg/dL (ref 1.7–2.4)

## 2016-09-16 LAB — PROCALCITONIN: Procalcitonin: <0.1 ng/mL

## 2016-09-16 LAB — HEPARIN LEVEL (UNFRACTIONATED): Heparin Unfractionated: 0.36 IU/mL (ref 0.30–0.70)

## 2016-09-16 MED ORDER — SODIUM CHLORIDE 0.9 % IV SOLN
INTRAVENOUS | Status: DC
  Administered 2016-09-17: 05:00:00 via INTRAVENOUS

## 2016-09-16 MED ORDER — SODIUM CHLORIDE 0.9% FLUSH
3.0000 mL | Freq: Two times a day (BID) | INTRAVENOUS | Status: DC
  Administered 2016-09-16: 3 mL via INTRAVENOUS

## 2016-09-16 MED ORDER — VITAMIN D 1000 UNITS PO TABS
1000.0000 [IU] | ORAL_TABLET | Freq: Every day | ORAL | Status: DC
  Administered 2016-09-16 – 2016-09-18 (×3): 1000 [IU] via ORAL
  Filled 2016-09-16 (×3): qty 1

## 2016-09-16 MED ORDER — SODIUM CHLORIDE 0.9 % IV SOLN: 250.0000 mL | INTRAVENOUS | Status: DC | PRN

## 2016-09-16 MED ORDER — HYDRALAZINE HCL 50 MG PO TABS
50.0000 mg | ORAL_TABLET | Freq: Three times a day (TID) | ORAL | Status: DC
  Administered 2016-09-16 – 2016-09-18 (×8): 50 mg via ORAL
  Filled 2016-09-16 (×9): qty 1

## 2016-09-16 MED ORDER — ASPIRIN 81 MG PO CHEW
81.0000 mg | CHEWABLE_TABLET | ORAL | Status: AC
  Administered 2016-09-17: 81 mg via ORAL
  Filled 2016-09-16: qty 1

## 2016-09-16 MED ORDER — DOCUSATE SODIUM 100 MG PO CAPS
100.0000 mg | ORAL_CAPSULE | Freq: Two times a day (BID) | ORAL | Status: DC
  Administered 2016-09-16 – 2016-09-17 (×2): 100 mg via ORAL
  Filled 2016-09-16 (×3): qty 1

## 2016-09-16 MED ORDER — SODIUM CHLORIDE 0.9% FLUSH: 3.0000 mL | INTRAVENOUS | Status: DC | PRN

## 2016-09-16 MED ORDER — INFLUENZA VAC SPLIT QUAD 0.5 ML IM SUSY
0.5000 mL | PREFILLED_SYRINGE | INTRAMUSCULAR | Status: AC | PRN
  Administered 2016-09-18: 0.5 mL via INTRAMUSCULAR

## 2016-09-16 NOTE — Progress Notes (Signed)
[place] TEAM 1 - Stepdown/ICU TEAM  Ivyrose Hashman  IRJ:[id number] DOB: August 14, 1960 DOA: 09/14/2016 PCP: Arnoldo Morale, MD    Brief Narrative:  56yo F w/ a hx of HTN, HLD, PAD S/P vascular recanalization, DM2, and L transmetatarsal amputation who ran out of her meds 2 weeks prior to this admission. She noted that her blood pressure was running as high as 225/105 and her glucose was in the 300 range. She came to the ED reporting profound persistent shortness of breath, diaphoresis, and headache.   In the ED she was found to be profoundly hypoxic with O2 saturations in the 70s.   Subjective: Patient is resting comfortably in bed with no acute complaints at this time.  She has now agreed to use insulin, at least while in the hospital, as she feels that it has been adequately explained to her.  She denies chest pain or shortness of breath.  She is currently awaiting a cardiac cath scheduled for tomorrow.  Assessment & Plan:  Acute diastolic CHF exacerbation EF 55-60 percent with grade 2 diastolic dysfunction - cardiology following and directing diuretic therapy - for cardiac cath in a.m.  Malignant hypertension Blood pressure much improved following medication titration and diuresis in the hospital  NSTEMI Cards following - on ASA, heparin, BB, statin - to have cardiac cath 9/13  Uncontrolled DM2 A1c 8.8 - pt refusing to consider use of insulin at home  - has now agreed to sliding-scale insulin inpatient as needed  Acute renal injury on CKD Stage 3 Baseline crt 1.3 - creatinine not tolerating diuresis well - follow trend with holding of p.m. diuretic today  Recent Labs Lab 09/14/16 0809 09/15/16 0649 09/15/16 1442 09/16/16 0559  CREATININE 1.79* 1.62* 1.75* 1.67*    Peripheral arterial disease status post left SFA and popliteal stent and left SFA PTA with drug coated balloon in 08/2015  -s/p L foot TMA  DVT prophylaxis: IV heparin  Code Status: FULL CODE Family Communication:  no family present at time of exam  Disposition Plan: Await results of cardiac cath 9/13  Consultants:  Cardiology   Procedures: TTE - EF 55-60% - grade 2 DD  Antimicrobials:  none  Objective: Blood pressure 118/71, pulse (!) 55, temperature 98.2 F (36.8 C), temperature source Oral, resp. rate 20, height 5\' 4"  (1.626 m), weight 90.4 kg (199 lb 4.8 oz), SpO2 100 %.  Intake/Output Summary (Last 24 hours) at 09/16/16 1616 Last data filed at 09/16/16 1100  Gross per 24 hour  Intake           850.78 ml  Output             2250 ml  Net         -1399.22 ml   Filed Weights   09/14/16 1448 09/15/16 0439 09/16/16 0500  Weight: 92.5 kg (203 lb 14.8 oz) 91.2 kg (201 lb 1.6 oz) 90.4 kg (199 lb 4.8 oz)    Examination: General: No acute respiratory distress Lungs: Clear to auscultation bilaterally without wheezes or crackles Cardiovascular: Regular rate and rhythm without murmur gallop or rub normal S1 and S2 Abdomen: Nontender, nondistended, soft, bowel sounds positive, no rebound, no ascites, no appreciable mass Extremities: No significant cyanosis, clubbing, or edema bilateral lower extremities  CBC:  Recent Labs Lab 09/14/16 0809 09/15/16 0649 09/16/16 0559  WBC 16.3* 10.8* 9.1  NEUTROABS 13.8*  --   --   HGB 14.2 10.7* 11.0*  HCT 42.8 33.7* 34.7*  MCV 80.9 82.4 81.8  PLT 329 238 707   Basic Metabolic Panel:  Recent Labs Lab 09/14/16 0809 09/14/16 1906 09/15/16 0649 09/15/16 1442 09/16/16 0559  NA 139  --  139 137 137  K 3.6  --  2.8* 3.2* 4.0  CL 103  --  103 99* 106  CO2 27  --  28 30 28   GLUCOSE 287*  --  138* 238* 187*  BUN 15  --  10 11 10   CREATININE 1.79*  --  1.62* 1.75* 1.67*  CALCIUM 9.1  --  8.0* 8.2* 8.2*  MG  --  1.7  --   --  1.7   GFR: Estimated Creatinine Clearance: 40 mL/min (A) (by C-G formula based on SCr of 1.67 mg/dL (H)).  Liver Function Tests:  Recent Labs Lab 09/14/16 0809 09/16/16 0559  AST 25 16  ALT 21 14  ALKPHOS 131* 79    BILITOT 0.4 0.5  PROT 8.1 5.8*  ALBUMIN 3.2* 2.2*    Recent Labs Lab 09/14/16 0809  LIPASE 28    Cardiac Enzymes:  Recent Labs Lab 09/14/16 0809 09/14/16 1906 09/15/16 0016 09/15/16 0649  TROPONINI 1.05* 2.00* 1.73* 1.84*    HbA1C: Hemoglobin A1C  Date/Time Value Ref Range Status  09/02/2016 01:42 PM 9.4  Final  06/02/2016 09:28 AM 8.0  Final   Hgb A1c MFr Bld  Date/Time Value Ref Range Status  09/14/2016 07:06 PM 8.8 (H) 4.8 - 5.6 % Final    Comment:    (NOTE) Pre diabetes:          5.7%-6.4% Diabetes:              >6.4% Glycemic control for   <7.0% adults with diabetes     CBG:  Recent Labs Lab 09/15/16 1154 09/15/16 1619 09/15/16 2137 09/16/16 0846 09/16/16 1246  GLUCAP 221* 237* 230* 167* 175*     Scheduled Meds: . amLODipine  10 mg Oral Daily  . aspirin EC  81 mg Oral Daily  . atorvastatin  40 mg Oral Daily  . carvedilol  25 mg Oral BID WC  . clopidogrel  75 mg Oral Daily  . gabapentin  300 mg Oral TID  . hydrALAZINE  50 mg Oral TID  . insulin aspart  0-15 Units Subcutaneous TID WC  . insulin aspart  0-5 Units Subcutaneous QHS  . insulin glargine  8 Units Subcutaneous QHS  . isosorbide mononitrate  60 mg Oral Daily  . sodium chloride flush  3 mL Intravenous Q12H     LOS: 2 days   Cherene Altes, MD [facility] Office  [phone] Pager - Text Page per Amion as per below:  On-Call/Text Page:      [email or web address]      password [email or web address]  If 7PM-7AM, please contact night-coverage [email or web address] Password [email or web address] 09/16/2016, 4:16 PM

## 2016-09-16 NOTE — Progress Notes (Signed)
Progress Note  Patient Name: Rebekah Johnson Date of Encounter: 09/16/2016  Primary Cardiologist: new to Dr. [name]  Subjective   Feeling better overall. No SOB. No CP today. Was upset about receiving SQ insulin because she doesn't feel she was given an explanation as to why.  Inpatient Medications    Scheduled Meds: . amLODipine  10 mg Oral Daily  . aspirin EC  81 mg Oral Daily  . atorvastatin  40 mg Oral Daily  . carvedilol  25 mg Oral BID WC  . clopidogrel  75 mg Oral Daily  . gabapentin  300 mg Oral TID  . hydrALAZINE  50 mg Oral TID  . insulin aspart  0-15 Units Subcutaneous TID WC  . insulin aspart  0-5 Units Subcutaneous QHS  . insulin glargine  8 Units Subcutaneous QHS  . isosorbide mononitrate  60 mg Oral Daily  . sodium chloride flush  3 mL Intravenous Q12H   Continuous Infusions: . sodium chloride    . heparin 1,250 Units/hr (09/16/16 0347)   PRN Meds: sodium chloride, acetaminophen **OR** acetaminophen, ondansetron **OR** ondansetron (ZOFRAN) IV, sodium chloride flush, traMADol   Vital Signs    Vitals:   09/15/16 2146 09/16/16 0500 09/16/16 0820 09/16/16 0900  BP: (!) 150/70 (!) 148/68 (!) 160/81   Pulse: 67 (!) 54 (!) 59   Resp: 15 17 17    Temp:  98 F (36.7 C)  98.2 F (36.8 C)  TempSrc:  Oral  Oral  SpO2: 99% 96% 98%   Weight:  199 lb 4.8 oz (90.4 kg)    Height:        Intake/Output Summary (Last 24 hours) at 09/16/16 1142 Last data filed at 09/16/16 1100  Gross per 24 hour  Intake           850.78 ml  Output             2250 ml  Net         -1399.22 ml   Filed Weights   09/14/16 1448 09/15/16 0439 09/16/16 0500  Weight: 203 lb 14.8 oz (92.5 kg) 201 lb 1.6 oz (91.2 kg) 199 lb 4.8 oz (90.4 kg)    Telemetry    NSR; SB during nocturnal hours with brief sinus pause <2sec - Personally Reviewed  Physical Exam   GEN: No acute distress. Chronically ill appearing HEENT: Normocephalic, atraumatic, sclera non-icteric. Neck: No JVD or  bruits. Cardiac: RRR no murmurs, rubs, or gallops.  Radials/DP/PT 1+ and equal bilaterally.  Respiratory: Diminished BS bilaterally at bases, no further crackles. Breathing is unlabored. GI: Soft, nontender, non-distended, BS +x 4. MS: no deformity. Extremities: No clubbing or cyanosis. No edema. Prior transmetatarsal amputation of left forefoot Neuro:  AAOx3. Follows commands. Psych:  Responds to questions appropriately with a normal affect.  Labs    Chemistry  Recent Labs Lab 09/14/16 0809 09/15/16 0649 09/15/16 1442 09/16/16 0559  NA 139 139 137 137  K 3.6 2.8* 3.2* 4.0  CL 103 103 99* 106  CO2 27 28 30 28   GLUCOSE 287* 138* 238* 187*  BUN 15 10 11 10   CREATININE 1.79* 1.62* 1.75* 1.67*  CALCIUM 9.1 8.0* 8.2* 8.2*  PROT 8.1  --   --  5.8*  ALBUMIN 3.2*  --   --  2.2*  AST 25  --   --  16  ALT 21  --   --  14  ALKPHOS 131*  --   --  79  BILITOT 0.4  --   --  0.5  GFRNONAA 30* 34* 31* 33*  GFRAA 35* 39* 36* 38*  ANIONGAP 9 8 8  3*     Hematology  Recent Labs Lab 09/14/16 0809 09/15/16 0649 09/16/16 0559  WBC 16.3* 10.8* 9.1  RBC 5.29* 4.09 4.24  HGB 14.2 10.7* 11.0*  HCT 42.8 33.7* 34.7*  MCV 80.9 82.4 81.8  MCH 26.8 26.2 25.9*  MCHC 33.2 31.8 31.7  RDW 14.4 13.8 13.8  PLT 329 238 209    Cardiac Enzymes  Recent Labs Lab 09/14/16 0809 09/14/16 1906 09/15/16 0016 09/15/16 0649  TROPONINI 1.05* 2.00* 1.73* 1.84*      BNP  Recent Labs Lab 09/14/16 0809  BNP 549.0*     Radiology    Dg Chest Port 1 View  Result Date: 09/15/2016 CLINICAL DATA:  Shortness of breath for 3 days. EXAM: PORTABLE CHEST 1 VIEW COMPARISON:  09/14/2016 FINDINGS: The cardiomediastinal silhouette is within normal limits. There is pulmonary vascular congestion with mild diffuse interstitial opacity and mild patchy perihilar/ infrahilar opacity, overall mildly to moderately improved compared to the prior study. No sizable pleural effusion or pneumothorax is identified.  IMPRESSION: Interval improvement of bilateral lung opacities suggesting decreasing edema. Electronically Signed   By: Logan Bores M.D.   On: 09/15/2016 08:08    Cardiac Studies   2D echo 09/15/16 Study Conclusions  - Left ventricle: The cavity size was normal. Wall thickness was   increased in a pattern of mild LVH. Systolic function was normal.   The estimated ejection fraction was in the range of 55% to 60%.   There is akinesis of the basal-midinferolateral myocardium.   Features are consistent with a pseudonormal left ventricular   filling pattern, with concomitant abnormal relaxation and   increased filling pressure (grade 2 diastolic dysfunction).   Doppler parameters are consistent with high ventricular filling   pressure. - Mitral valve: There was mild regurgitation. - Left atrium: The atrium was mildly dilated.  Impressions: - Akinesis of the basal/mid inferolateral wall with overall   preserved LV systolic function; moderate diastolic dysfunction;   mild LVH; mild MR; mild LAE.   Patient Profile     56 y.o. female with HTN (patent renal arteries by [facility] angio 08/2015), uncontrolled DM2, hyperlipidemia, CKD stage III by labs, diabetic neuropathy, PVD (status post left SFA and popliteal stent and left SFA PTA with drug coated balloon in 08/2015), left foot transmetatarsal amputation, tobacco abuse, obesity admitted with progressive DOE, two pillow orthopnea, PND, mild LEE, diaphoresis but no chest pain - out of medications for 3 days. Found to have acute hypoxic respiratory failure with O2 sats 70s requiring bipap. Prior notes indicate poor insight to disease, poor dietary compliance. Felt to have new onset CHF, also with troponin of 2.  Assessment & Plan    1. New acute diastolic CHF/suspected hypertensive heart disease - she is now at her stated baseline weight and volume much improved. Received 40mg  IV Lasix this AM. Will hold further doses in anticipation of cath. Pt reports  chronic edema for which she uses compression hose - will order.  2. NSTEMI - continue aspirin, heparin, beta blocker, statin. She was on Plavix prior to admission due to PAD. Have already discussed cath in depth with patient yesterday including risks, benefits, process. She remains agreeable. Will plan for this in AM.NPO after midnight. Orders written.  3. AKI on CKD stage III - Cr improving with diuresis. UA concerning for nephrotic process. Will order 24-hour urine protein eval. Consider nephrology  evaluation - denies prior kidney eval.  4. Essential HTN - ?driven by nephrotic process - if so, may benefit from low dose ACEI after cath has been performed. Continue amlodipine, carvedilol, higher dose of Imdur. Will titrate hydralazine to 50mg  TID.   4. Hypokalemia - hold KCl since holding further Lasix today.  5. Anemia, normocytic - suspect due to CKD. Hemoccult neg.  Signed, Melina Copa PA-C (pager [phone]) 09/16/2016, 11:42 AM    The patient was seen, examined and discussed with Melina Copa, PA-C and I agree with the above.   1. Acute on chronic CHF - LVEF 55-60% with grade 2 DD, she is still fluid overloaded, diuresis -500 cc overnight with weight down 6 lbs in 48 hours and good symptoms improvement. I suspect that the etiology is uncontrolled hypertension and  noncompliance. She was given Lasix 40 mg IV this am, we will hold this pm for a cath in the am tomorrow. Creatinine closely currently 1.79->1.64from baseline 1.45. We'll follow creatinine closely.  2. Hypertensive heart disease with CHF - the patient was restarted on lisinopril, carvedilol, amlodipine, I will hold lisinopril considering acute renal failure and baseline CKD, we started hydralazine and Imdur, still hypertensive, I will increase hydralazine to 50 mg PO TID.   3. NSTEMI - troponin - 2->1.8, no cath yet as crea elevated, scheduled for Thursday  4. Medication noncompliance - it seems the patient has poor insight  to her disease based also on her endocrinologist note. It will be important to obtain generic tracts are she is able to afford them.  Ena Dawley, MD 09/16/2016

## 2016-09-16 NOTE — Progress Notes (Signed)
Nutrition Education Note  RD consulted for nutrition education regarding new onset CHF and diabetes.  RD provided "Low Sodium Nutrition Therapy" handout from the Academy of [employer]. Reviewed patient's dietary recall. Provided examples on ways to decrease sodium intake in diet. Discouraged intake of processed foods and use of salt shaker. Encouraged fresh fruits and vegetables as well as whole grain sources of carbohydrates to maximize fiber intake. RD discussed why it is important for patient to adhere to diet recommendations, and emphasized the role of fluids, foods to avoid, and importance of weighing self daily.   Pt reports having prior class on diabetes, but requested a refresher. Pt knowledgeable on which types of food affect blood sugar and how to read a nutrition label.   RD provided "Carbohydrate Counting for People with Diabetes" handout from the Academy of [employer]etics. Discussed different food groups and their effects on blood sugar, emphasizing carbohydrate-containing foods. Provided list of carbohydrates and recommended serving sizes of common foods.Discussed importance of controlled and consistent carbohydrate intake throughout the day. Provided examples of ways to balance meals/snacks and encouraged intake of high-fiber, whole grain complex carbohydrates. Teach back method used.  Expect fair compliance.  Body mass index is 34.21 kg/m. Pt meets criteria for obese based on current BMI.  Current diet order is 2 gram sodium, patient is consuming approximately 100% of meals at this time. Labs and medications reviewed. No further nutrition interventions warranted at this time. RD contact information provided. If additional nutrition issues arise, please re-consult RD.   Rebekah Johnson RD, LDN Clinical Nutrition Pager # [phone]

## 2016-09-16 NOTE — Progress Notes (Signed)
ANTICOAGULATION CONSULT NOTE  Pharmacy Consult for Heparin Indication: NSTEMI  No Known Allergies  Patient Measurements: Height: 5\' 4"  (162.6 cm) Weight: 199 lb 4.8 oz (90.4 kg) IBW/kg (Calculated) : 54.7 Heparin Dosing Weight: 75.6 kg  Vital Signs: Temp: 98.2 F (36.8 C) (09/12 0900) Temp Source: Oral (09/12 0900) BP: 160/81 (09/12 0820) Pulse Rate: 59 (09/12 0820)  Labs:  Recent Labs  09/14/16 0809 09/14/16 1906 09/15/16 0016 09/15/16 0649 09/15/16 1442 09/15/16 1632 09/16/16 0559  HGB 14.2  --   --  10.7*  --   --  11.0*  HCT 42.8  --   --  33.7*  --   --  34.7*  PLT 329  --   --  238  --   --  209  HEPARINUNFRC  --   --   --   --   --  0.27* 0.36  CREATININE 1.79*  --   --  1.62* 1.75*  --  1.67*  TROPONINI 1.05* 2.00* 1.73* 1.84*  --   --   --     Estimated Creatinine Clearance: 40 mL/min (A) (by C-G formula based on SCr of 1.67 mg/dL (H)).    Assessment: 56 yo F here with NSTEMI. Pharmacy dosing heparin. Plans noted for cath when renal function allows -heparin level= 0.36 and at goal  Goal of Therapy:  Heparin level 0.3-0.7 units/ml Monitor platelets by anticoagulation protocol: Yes   Plan:  -No heparin changes needed -Daily heparin level and CBC  Hildred Laser, Pharm D 09/16/2016 10:34 AM

## 2016-09-17 ENCOUNTER — Encounter (HOSPITAL_COMMUNITY): Payer: Self-pay | Admitting: Interventional Cardiology

## 2016-09-17 ENCOUNTER — Encounter (HOSPITAL_COMMUNITY)
Admission: EM | Disposition: A | Discharge status: Home / Self Care | Payer: Self-pay | Source: Home / Self Care | Attending: Internal Medicine

## 2016-09-17 ENCOUNTER — Ambulatory Visit: Payer: Medicaid Other

## 2016-09-17 DIAGNOSIS — I2510 Atherosclerotic heart disease of native coronary artery without angina pectoris: Secondary | ICD-10-CM

## 2016-09-17 HISTORY — PX: LEFT HEART CATH AND CORONARY ANGIOGRAPHY: CATH118249

## 2016-09-17 LAB — HEPARIN LEVEL (UNFRACTIONATED): Heparin Unfractionated: 0.27 IU/mL — ABNORMAL LOW (ref 0.30–0.70)

## 2016-09-17 LAB — BASIC METABOLIC PANEL
Anion gap: 7 (ref 5–15)
BUN: 11 mg/dL (ref 6–20)
CHLORIDE: 103 mmol/L (ref 101–111)
CO2: 27 mmol/L (ref 22–32)
Calcium: 8.5 mg/dL — ABNORMAL LOW (ref 8.9–10.3)
Creatinine, Ser: 1.73 mg/dL — ABNORMAL HIGH (ref 0.44–1.00)
GFR calc Af Amer: 36 mL/min — ABNORMAL LOW (ref >60)
GFR calc non Af Amer: 31 mL/min — ABNORMAL LOW (ref >60)
GLUCOSE: 160 mg/dL — AB (ref 65–99)
Potassium: 4 mmol/L (ref 3.5–5.1)
Sodium: 137 mmol/L (ref 135–145)

## 2016-09-17 LAB — PROTEIN, URINE, 24 HOUR
Collection Interval-UPROT: 24 hours
PROTEIN, 24H URINE: 5610 mg/d — AB (ref 50–100)
PROTEIN, URINE: 330 mg/dL
URINE TOTAL VOLUME-UPROT: 1700 mL

## 2016-09-17 LAB — CBC
HEMATOCRIT: 33.6 % — AB (ref 36.0–46.0)
Hemoglobin: 10.4 g/dL — ABNORMAL LOW (ref 12.0–15.0)
MCH: 25.6 pg — AB (ref 26.0–34.0)
MCHC: 31 g/dL (ref 30.0–36.0)
MCV: 82.8 fL (ref 78.0–100.0)
Platelets: 212 10*3/uL (ref 150–400)
RBC: 4.06 MIL/uL (ref 3.87–5.11)
RDW: 13.8 % (ref 11.5–15.5)
WBC: 9.7 10*3/uL (ref 4.0–10.5)

## 2016-09-17 LAB — GLUCOSE, CAPILLARY
Glucose-Capillary: 163 mg/dL — ABNORMAL HIGH (ref 65–99)
Glucose-Capillary: 120 mg/dL — ABNORMAL HIGH (ref 65–99)
Glucose-Capillary: 104 mg/dL — ABNORMAL HIGH (ref 65–99)
Glucose-Capillary: 127 mg/dL — ABNORMAL HIGH (ref 65–99)

## 2016-09-17 LAB — PROTIME-INR
INR: 1
Prothrombin Time: 13.1 seconds (ref 11.4–15.2)

## 2016-09-17 SURGERY — LEFT HEART CATH AND CORONARY ANGIOGRAPHY
Anesthesia: LOCAL

## 2016-09-17 MED ORDER — IOPAMIDOL (ISOVUE-370) INJECTION 76%
INTRAVENOUS | Status: AC
  Filled 2016-09-17: qty 100

## 2016-09-17 MED ORDER — VERAPAMIL HCL 2.5 MG/ML IV SOLN
INTRAVENOUS | Status: DC | PRN
  Administered 2016-09-17: 6 mL via INTRA_ARTERIAL
  Administered 2016-09-17: 13:00:00 via INTRA_ARTERIAL

## 2016-09-17 MED ORDER — VERAPAMIL HCL 2.5 MG/ML IV SOLN
INTRAVENOUS | Status: AC
  Filled 2016-09-17: qty 2

## 2016-09-17 MED ORDER — HEPARIN SODIUM (PORCINE) 1000 UNIT/ML IJ SOLN
INTRAMUSCULAR | Status: AC
  Filled 2016-09-17: qty 1

## 2016-09-17 MED ORDER — ATORVASTATIN CALCIUM 80 MG PO TABS
80.0000 mg | ORAL_TABLET | Freq: Every day | ORAL | Status: DC
  Administered 2016-09-18: 80 mg via ORAL
  Filled 2016-09-17: qty 1

## 2016-09-17 MED ORDER — MIDAZOLAM HCL 2 MG/2ML IJ SOLN
INTRAMUSCULAR | Status: DC | PRN
  Administered 2016-09-17: 2 mg via INTRAVENOUS

## 2016-09-17 MED ORDER — LIDOCAINE HCL (PF) 1 % IJ SOLN
INTRAMUSCULAR | Status: DC | PRN
  Administered 2016-09-17: 5 mL

## 2016-09-17 MED ORDER — ACETAMINOPHEN 325 MG PO TABS
650.0000 mg | ORAL_TABLET | ORAL | Status: DC | PRN
  Administered 2016-09-17: 650 mg via ORAL
  Filled 2016-09-17: qty 2

## 2016-09-17 MED ORDER — SODIUM CHLORIDE 0.9% FLUSH
3.0000 mL | Freq: Two times a day (BID) | INTRAVENOUS | Status: DC
  Administered 2016-09-18: 3 mL via INTRAVENOUS

## 2016-09-17 MED ORDER — INSULIN ASPART 100 UNIT/ML ~~LOC~~ SOLN
0.0000 [IU] | SUBCUTANEOUS | Status: DC
  Administered 2016-09-18 (×3): 1 [IU] via SUBCUTANEOUS

## 2016-09-17 MED ORDER — HEPARIN SODIUM (PORCINE) 1000 UNIT/ML IJ SOLN
INTRAMUSCULAR | Status: DC | PRN
  Administered 2016-09-17: 4500 [IU] via INTRAVENOUS

## 2016-09-17 MED ORDER — LIDOCAINE HCL (PF) 1 % IJ SOLN
INTRAMUSCULAR | Status: AC
  Filled 2016-09-17: qty 30

## 2016-09-17 MED ORDER — SODIUM CHLORIDE 0.9 % IV SOLN
250.0000 mL | INTRAVENOUS | Status: DC | PRN
Start: 2016-09-17 — End: 2016-09-18

## 2016-09-17 MED ORDER — FENTANYL CITRATE (PF) 100 MCG/2ML IJ SOLN
INTRAMUSCULAR | Status: DC | PRN
  Administered 2016-09-17: 25 ug via INTRAVENOUS

## 2016-09-17 MED ORDER — SODIUM CHLORIDE 0.9 % IV SOLN: INTRAVENOUS | Status: AC

## 2016-09-17 MED ORDER — SODIUM CHLORIDE 0.9% FLUSH: 3.0000 mL | INTRAVENOUS | Status: DC | PRN

## 2016-09-17 MED ORDER — HEPARIN (PORCINE) IN NACL 2-0.9 UNIT/ML-% IJ SOLN
INTRAMUSCULAR | Status: AC
  Filled 2016-09-17: qty 1000

## 2016-09-17 MED ORDER — HEPARIN SODIUM (PORCINE) 5000 UNIT/ML IJ SOLN
5000.0000 [IU] | Freq: Three times a day (TID) | INTRAMUSCULAR | Status: DC
  Administered 2016-09-17 – 2016-09-18 (×3): 5000 [IU] via SUBCUTANEOUS
  Filled 2016-09-17 (×3): qty 1

## 2016-09-17 MED ORDER — FENTANYL CITRATE (PF) 100 MCG/2ML IJ SOLN
INTRAMUSCULAR | Status: AC
  Filled 2016-09-17: qty 2

## 2016-09-17 MED ORDER — SODIUM CHLORIDE 0.9 % IV SOLN
INTRAVENOUS | Status: DC
  Administered 2016-09-17: 23:00:00 via INTRAVENOUS

## 2016-09-17 MED ORDER — HEPARIN (PORCINE) IN NACL 2-0.9 UNIT/ML-% IJ SOLN
INTRAMUSCULAR | Status: AC | PRN
  Administered 2016-09-17: 1000 mL

## 2016-09-17 MED ORDER — ONDANSETRON HCL 4 MG/2ML IJ SOLN: 4.0000 mg | Freq: Four times a day (QID) | INTRAMUSCULAR | Status: DC | PRN

## 2016-09-17 MED ORDER — IOPAMIDOL (ISOVUE-370) INJECTION 76%
INTRAVENOUS | Status: DC | PRN
  Administered 2016-09-17: 50 mL via INTRA_ARTERIAL

## 2016-09-17 MED ORDER — INSULIN GLARGINE 100 UNIT/ML ~~LOC~~ SOLN
12.0000 [IU] | Freq: Every day | SUBCUTANEOUS | Status: DC
  Filled 2016-09-17: qty 0.12

## 2016-09-17 MED ORDER — MIDAZOLAM HCL 2 MG/2ML IJ SOLN
INTRAMUSCULAR | Status: AC
  Filled 2016-09-17: qty 2

## 2016-09-17 SURGICAL SUPPLY — 11 items
CATH INFINITI 5 FR JL3.5 (CATHETERS) ×2 IMPLANT
CATH INFINITI JR4 5F (CATHETERS) ×2 IMPLANT
DEVICE RAD COMP TR BAND LRG (VASCULAR PRODUCTS) ×2 IMPLANT
GLIDESHEATH SLEND SS 6F .021 (SHEATH) ×2 IMPLANT
GUIDEWIRE INQWIRE 1.5J.035X260 (WIRE) ×1 IMPLANT
INQWIRE 1.5J .035X260CM (WIRE) ×2
KIT HEART LEFT (KITS) ×2 IMPLANT
PACK CARDIAC CATHETERIZATION (CUSTOM PROCEDURE TRAY) ×2 IMPLANT
TRANSDUCER W/STOPCOCK (MISCELLANEOUS) ×2 IMPLANT
TUBING CIL FLEX 10 FLL-RA (TUBING) ×2 IMPLANT
WIRE HI TORQ VERSACORE-J 145CM (WIRE) ×2 IMPLANT

## 2016-09-17 NOTE — Interval H&P Note (Signed)
Cath Lab Visit (complete for each Cath Lab visit)  Clinical Evaluation Leading to the Procedure:   ACS: Yes.    Non-ACS:    Anginal Classification: CCS IV  Anti-ischemic medical therapy: Minimal Therapy (1 class of medications)  Non-Invasive Test Results: No non-invasive testing performed  Prior CABG: No previous CABG      History and Physical Interval Note:  09/17/2016 1:12 PM  Rebekah Johnson  has presented today for surgery, with the diagnosis of n stemi  The various methods of treatment have been discussed with the patient and family. After consideration of risks, benefits and other options for treatment, the patient has consented to  Procedure(s): LEFT HEART CATH AND CORONARY ANGIOGRAPHY (N/A) as a surgical intervention .  The patient's history has been reviewed, patient examined, no change in status, stable for surgery.  I have reviewed the patient's chart and labs.  Questions were answered to the patient's satisfaction.     Larae Grooms

## 2016-09-17 NOTE — Progress Notes (Signed)
Progress Note  Patient Name: Rebekah Johnson Date of Encounter: 09/17/2016  Primary Cardiologist: new Dr. [name]  Subjective   No chest pain and no SOB  Inpatient Medications    Scheduled Meds: . amLODipine  10 mg Oral Daily  . aspirin EC  81 mg Oral Daily  . atorvastatin  40 mg Oral Daily  . carvedilol  25 mg Oral BID WC  . cholecalciferol  1,000 Units Oral Daily  . clopidogrel  75 mg Oral Daily  . docusate sodium  100 mg Oral BID  . gabapentin  300 mg Oral TID  . hydrALAZINE  50 mg Oral TID  . insulin aspart  0-15 Units Subcutaneous TID WC  . insulin aspart  0-5 Units Subcutaneous QHS  . insulin glargine  8 Units Subcutaneous QHS  . isosorbide mononitrate  60 mg Oral Daily  . sodium chloride flush  3 mL Intravenous Q12H  . sodium chloride flush  3 mL Intravenous Q12H   Continuous Infusions: . sodium chloride    . sodium chloride    . sodium chloride 10 mL/hr at 09/17/16 0523  . heparin 1,400 Units/hr (09/17/16 0348)   PRN Meds: sodium chloride, sodium chloride, acetaminophen **OR** acetaminophen, Influenza vac split quadrivalent PF, ondansetron **OR** ondansetron (ZOFRAN) IV, sodium chloride flush, sodium chloride flush, traMADol   Vital Signs    Vitals:   09/16/16 2005 09/16/16 2359 09/17/16 0440 09/17/16 0857  BP: 140/61 (!) 150/64 (!) 146/58 (!) 161/65  Pulse: 71 77 (!) 56   Resp: (!) 22 19 14    Temp: 98.1 F (36.7 C) 99 F (37.2 C) 99 F (37.2 C)   TempSrc: Oral Oral Oral   SpO2: 100% 100% 100%   Weight:   197 lb 8 oz (89.6 kg)   Height:        Intake/Output Summary (Last 24 hours) at 09/17/16 0908 Last data filed at 09/17/16 0600  Gross per 24 hour  Intake           536.97 ml  Output              425 ml  Net           111.97 ml   Filed Weights   09/15/16 0439 09/16/16 0500 09/17/16 0440  Weight: 201 lb 1.6 oz (91.2 kg) 199 lb 4.8 oz (90.4 kg) 197 lb 8 oz (89.6 kg)    Telemetry    SR with Rare PVC - Personally Reviewed  ECG    No new  - Personally Reviewed  Physical Exam   GEN: No acute distress.   Neck: No JVD Cardiac: RRR, no murmurs, rubs, or gallops.  Respiratory: Clear to auscultation bilaterally. GI: Soft, nontender, non-distended  MS: No edema; No deformity. Neuro:  Nonfocal  Psych: Normal affect   Labs    Chemistry Recent Labs Lab 09/14/16 0809  09/15/16 1442 09/16/16 0559 09/17/16 0236  NA 139  < > 137 137 137  K 3.6  < > 3.2* 4.0 4.0  CL 103  < > 99* 106 103  CO2 27  < > 30 28 27   GLUCOSE 287*  < > 238* 187* 160*  BUN 15  < > 11 10 11   CREATININE 1.79*  < > 1.75* 1.67* 1.73*  CALCIUM 9.1  < > 8.2* 8.2* 8.5*  PROT 8.1  --   --  5.8*  --   ALBUMIN 3.2*  --   --  2.2*  --   AST 25  --   --  16  --   ALT 21  --   --  14  --   ALKPHOS 131*  --   --  79  --   BILITOT 0.4  --   --  0.5  --   GFRNONAA 30*  < > 31* 33* 31*  GFRAA 35*  < > 36* 38* 36*  ANIONGAP 9  < > 8 3* 7  < > = values in this interval not displayed.   Hematology Recent Labs Lab 09/15/16 0649 09/16/16 0559 09/17/16 0236  WBC 10.8* 9.1 9.7  RBC 4.09 4.24 4.06  HGB 10.7* 11.0* 10.4*  HCT 33.7* 34.7* 33.6*  MCV 82.4 81.8 82.8  MCH 26.2 25.9* 25.6*  MCHC 31.8 31.7 31.0  RDW 13.8 13.8 13.8  PLT 238 209 212    Cardiac Enzymes Recent Labs Lab 09/14/16 0809 09/14/16 1906 09/15/16 0016 09/15/16 0649  TROPONINI 1.05* 2.00* 1.73* 1.84*   No results for input(s): TROPIPOC in the last 168 hours.   BNP Recent Labs Lab 09/14/16 0809  BNP 549.0*     DDimer No results for input(s): DDIMER in the last 168 hours.   Radiology    No results found.  Cardiac Studies   2D echo 09/15/16 Study Conclusions  - Left ventricle: The cavity size was normal. Wall thickness was increased in a pattern of mild LVH. Systolic function was normal. The estimated ejection fraction was in the range of 55% to 60%. There is akinesis of the basal-midinferolateral myocardium. Features are consistent with a pseudonormal left  ventricular filling pattern, with concomitant abnormal relaxation and increased filling pressure (grade 2 diastolic dysfunction). Doppler parameters are consistent with high ventricular filling pressure. - Mitral valve: There was mild regurgitation. - Left atrium: The atrium was mildly dilated.  Impressions: - Akinesis of the basal/mid inferolateral wall with overall preserved LV systolic function; moderate diastolic dysfunction; mild LVH; mild MR; mild LAE.  Patient Profile     56 y.o. female with HTN (patent renal arteries by PV angio 08/2015), uncontrolled DM2, hyperlipidemia, CKD stage III by labs, diabetic neuropathy, PVD (status post left SFA and popliteal stent and left SFA PTA with drug coated balloon in 08/2015), left foot transmetatarsal amputation, tobacco abuse, obesity admitted with progressive DOE, two pillow orthopnea, PND, mild LEE, diaphoresis but no chest pain - out of medications for 3 days. Found to have acute hypoxic respiratory failure with O2 sats 70s requiring bipap. Prior notes indicate poor insight to disease, poor dietary compliance. Felt to have new onset CHF, also with troponin of 2.  Assessment & Plan    1. New acute diastolic CHF/suspected hypertensive heart disease- EF 55-60% G2DDshe is now at her stated baseline weight and volume much improved. Received 40mg  IV Lasix this AM. Further doses in anticipation of cath. Pt reports chronic edema for which she uses compression hose -  --neg 1937 since admit and wt down from 203 to 197 lbs- no SOB this AM  2. NSTEMI - continue aspirin, heparin, beta blocker, statin. She was on Plavix prior to admission due to PAD. Have already discussed cath in depth with patient yesterday including risks, benefits, process. She remains agreeable.Cath today .NPO after midnight. Orders written. No questions  3. AKI on CKD stage III - Cr improving with diuresis. UA concerning for nephrotic process. Will order 24-hour urine  protein eval. Consider nephrology evaluation - denies prior kidney eval. --Cr 1.73 today   4. Essential HTN - ?driven by nephrotic process - if so,  may benefit from low dose ACEI after cath has been performed. Continue amlodipine, carvedilol, higher dose of Imdur. Will titrate hydralazine to 50mg  TID. --BP today 161/65 for cath today   4. Hypokalemia- resolved K+ 4.0  5. Anemia, normocytic- suspect due to CKD. Hemoccult neg. hgb at 10.4 today   For questions or updates, please contact [place] Please consult [email or web address] for contact info under Cardiology/STEMI.     Signed, Cecilie Kicks, NP  09/17/2016, 9:08 AM    The patient was seen, examined and discussed with Cecilie Kicks, NP and I agree with the above.   1. Acute on chronic CHF - LVEF 55-60% with grade 2 DD, now euvolemic, Lasix held for cath today.  I suspect that the etiology is uncontrolled hypertension and  noncompliance. Creatinine closely currently 1.79->1.67->1.73,from baseline 1.45. We'll follow creatinine closely.  2. Hypertensive heart disease with CHF - the patient was restarted on lisinopril, carvedilol, amlodipine, I will hold lisinopril considering acute renal failure and baseline CKD, we started hydralazine and Imdur.  3. NSTEMI - troponin - 2->1.8, no cath yet as crea elevated, scheduled for today.  4. Medication noncompliance - it seems the patient has poor insight to her disease based also on her endocrinologist note. It will be important to obtain generic tracts are she is able to afford them.  Ena Dawley, MD 09/17/2016

## 2016-09-17 NOTE — H&P (View-Only) (Signed)
Progress Note  Patient Name: Rebekah Johnson Date of Encounter: 09/16/2016  Primary Cardiologist: new to Dr. [name]  Subjective   Feeling better overall. No SOB. No CP today. Was upset about receiving SQ insulin because she doesn't feel she was given an explanation as to why.  Inpatient Medications    Scheduled Meds: . amLODipine  10 mg Oral Daily  . aspirin EC  81 mg Oral Daily  . atorvastatin  40 mg Oral Daily  . carvedilol  25 mg Oral BID WC  . clopidogrel  75 mg Oral Daily  . gabapentin  300 mg Oral TID  . hydrALAZINE  50 mg Oral TID  . insulin aspart  0-15 Units Subcutaneous TID WC  . insulin aspart  0-5 Units Subcutaneous QHS  . insulin glargine  8 Units Subcutaneous QHS  . isosorbide mononitrate  60 mg Oral Daily  . sodium chloride flush  3 mL Intravenous Q12H   Continuous Infusions: . sodium chloride    . heparin 1,250 Units/hr (09/16/16 0347)   PRN Meds: sodium chloride, acetaminophen **OR** acetaminophen, ondansetron **OR** ondansetron (ZOFRAN) IV, sodium chloride flush, traMADol   Vital Signs    Vitals:   09/15/16 2146 09/16/16 0500 09/16/16 0820 09/16/16 0900  BP: (!) 150/70 (!) 148/68 (!) 160/81   Pulse: 67 (!) 54 (!) 59   Resp: 15 17 17    Temp:  98 F (36.7 C)  98.2 F (36.8 C)  TempSrc:  Oral  Oral  SpO2: 99% 96% 98%   Weight:  199 lb 4.8 oz (90.4 kg)    Height:        Intake/Output Summary (Last 24 hours) at 09/16/16 1142 Last data filed at 09/16/16 1100  Gross per 24 hour  Intake           850.78 ml  Output             2250 ml  Net         -1399.22 ml   Filed Weights   09/14/16 1448 09/15/16 0439 09/16/16 0500  Weight: 203 lb 14.8 oz (92.5 kg) 201 lb 1.6 oz (91.2 kg) 199 lb 4.8 oz (90.4 kg)    Telemetry    NSR; SB during nocturnal hours with brief sinus pause <2sec - Personally Reviewed  Physical Exam   GEN: No acute distress. Chronically ill appearing HEENT: Normocephalic, atraumatic, sclera non-icteric. Neck: No JVD or  bruits. Cardiac: RRR no murmurs, rubs, or gallops.  Radials/DP/PT 1+ and equal bilaterally.  Respiratory: Diminished BS bilaterally at bases, no further crackles. Breathing is unlabored. GI: Soft, nontender, non-distended, BS +x 4. MS: no deformity. Extremities: No clubbing or cyanosis. No edema. Prior transmetatarsal amputation of left forefoot Neuro:  AAOx3. Follows commands. Psych:  Responds to questions appropriately with a normal affect.  Labs    Chemistry  Recent Labs Lab 09/14/16 0809 09/15/16 0649 09/15/16 1442 09/16/16 0559  NA 139 139 137 137  K 3.6 2.8* 3.2* 4.0  CL 103 103 99* 106  CO2 27 28 30 28   GLUCOSE 287* 138* 238* 187*  BUN 15 10 11 10   CREATININE 1.79* 1.62* 1.75* 1.67*  CALCIUM 9.1 8.0* 8.2* 8.2*  PROT 8.1  --   --  5.8*  ALBUMIN 3.2*  --   --  2.2*  AST 25  --   --  16  ALT 21  --   --  14  ALKPHOS 131*  --   --  79  BILITOT 0.4  --   --  0.5  GFRNONAA 30* 34* 31* 33*  GFRAA 35* 39* 36* 38*  ANIONGAP 9 8 8  3*     Hematology  Recent Labs Lab 09/14/16 0809 09/15/16 0649 09/16/16 0559  WBC 16.3* 10.8* 9.1  RBC 5.29* 4.09 4.24  HGB 14.2 10.7* 11.0*  HCT 42.8 33.7* 34.7*  MCV 80.9 82.4 81.8  MCH 26.8 26.2 25.9*  MCHC 33.2 31.8 31.7  RDW 14.4 13.8 13.8  PLT 329 238 209    Cardiac Enzymes  Recent Labs Lab 09/14/16 0809 09/14/16 1906 09/15/16 0016 09/15/16 0649  TROPONINI 1.05* 2.00* 1.73* 1.84*      BNP  Recent Labs Lab 09/14/16 0809  BNP 549.0*     Radiology    Dg Chest Port 1 View  Result Date: 09/15/2016 CLINICAL DATA:  Shortness of breath for 3 days. EXAM: PORTABLE CHEST 1 VIEW COMPARISON:  09/14/2016 FINDINGS: The cardiomediastinal silhouette is within normal limits. There is pulmonary vascular congestion with mild diffuse interstitial opacity and mild patchy perihilar/ infrahilar opacity, overall mildly to moderately improved compared to the prior study. No sizable pleural effusion or pneumothorax is identified.  IMPRESSION: Interval improvement of bilateral lung opacities suggesting decreasing edema. Electronically Signed   By: Logan Bores M.D.   On: 09/15/2016 08:08    Cardiac Studies   2D echo 09/15/16 Study Conclusions  - Left ventricle: The cavity size was normal. Wall thickness was   increased in a pattern of mild LVH. Systolic function was normal.   The estimated ejection fraction was in the range of 55% to 60%.   There is akinesis of the basal-midinferolateral myocardium.   Features are consistent with a pseudonormal left ventricular   filling pattern, with concomitant abnormal relaxation and   increased filling pressure (grade 2 diastolic dysfunction).   Doppler parameters are consistent with high ventricular filling   pressure. - Mitral valve: There was mild regurgitation. - Left atrium: The atrium was mildly dilated.  Impressions: - Akinesis of the basal/mid inferolateral wall with overall   preserved LV systolic function; moderate diastolic dysfunction;   mild LVH; mild MR; mild LAE.   Patient Profile     56 y.o. female with HTN (patent renal arteries by PV angio 08/2015), uncontrolled DM2, hyperlipidemia, CKD stage III by labs, diabetic neuropathy, PVD (status post left SFA and popliteal stent and left SFA PTA with drug coated balloon in 08/2015), left foot transmetatarsal amputation, tobacco abuse, obesity admitted with progressive DOE, two pillow orthopnea, PND, mild LEE, diaphoresis but no chest pain - out of medications for 3 days. Found to have acute hypoxic respiratory failure with O2 sats 70s requiring bipap. Prior notes indicate poor insight to disease, poor dietary compliance. Felt to have new onset CHF, also with troponin of 2.  Assessment & Plan    1. New acute diastolic CHF/suspected hypertensive heart disease - she is now at her stated baseline weight and volume much improved. Received 40mg  IV Lasix this AM. Will hold further doses in anticipation of cath. Pt reports  chronic edema for which she uses compression hose - will order.  2. NSTEMI - continue aspirin, heparin, beta blocker, statin. She was on Plavix prior to admission due to PAD. Have already discussed cath in depth with patient yesterday including risks, benefits, process. She remains agreeable. Will plan for this in AM.NPO after midnight. Orders written.  3. AKI on CKD stage III - Cr improving with diuresis. UA concerning for nephrotic process. Will order 24-hour urine protein eval. Consider nephrology  evaluation - denies prior kidney eval.  4. Essential HTN - ?driven by nephrotic process - if so, may benefit from low dose ACEI after cath has been performed. Continue amlodipine, carvedilol, higher dose of Imdur. Will titrate hydralazine to 50mg  TID.   4. Hypokalemia - hold KCl since holding further Lasix today.  5. Anemia, normocytic - suspect due to CKD. Hemoccult neg.  Signed, Melina Copa PA-C (pager [phone]) 09/16/2016, 11:42 AM    The patient was seen, examined and discussed with Melina Copa, PA-C and I agree with the above.   1. Acute on chronic CHF - LVEF 55-60% with grade 2 DD, she is still fluid overloaded, diuresis -500 cc overnight with weight down 6 lbs in 48 hours and good symptoms improvement. I suspect that the etiology is uncontrolled hypertension and  noncompliance. She was given Lasix 40 mg IV this am, we will hold this pm for a cath in the am tomorrow. Creatinine closely currently 1.79->1.75from baseline 1.45. We'll follow creatinine closely.  2. Hypertensive heart disease with CHF - the patient was restarted on lisinopril, carvedilol, amlodipine, I will hold lisinopril considering acute renal failure and baseline CKD, we started hydralazine and Imdur, still hypertensive, I will increase hydralazine to 50 mg PO TID.   3. NSTEMI - troponin - 2->1.8, no cath yet as crea elevated, scheduled for Thursday  4. Medication noncompliance - it seems the patient has poor insight  to her disease based also on her endocrinologist note. It will be important to obtain generic tracts are she is able to afford them.  Ena Dawley, MD 09/16/2016

## 2016-09-17 NOTE — Progress Notes (Signed)
PROGRESS NOTE    Rebekah Johnson  LFY:[id number] DOB: Mar 31, 1960 DOA: 09/14/2016 PCP: Arnoldo Morale, MD   Brief Narrative:  56 y.o. BF PMHx Birth Control HTN, HLD, PAD S/P vascular recanalization, Diabetes type 2 uncontrolled with complications , LEFT Transmetatarsal amputation.   Patient reports running out of her medications approximate 2 weeks prior to admission. After 2 days she was able to get these refilled. During that today. Patient noted that her blood pressure was running as high as 225/105 and a glucose ran in the 300 range. When she is able to get her medications refilled her blood pressure resumed its normal state with a systolic reading of approximately 170-180. Patient reports 5 days prior to admission developing a short-lived episode of profound diaphoresis and weakness. Patient initially thought that her glucose was too high or too low. She checked her glucose which was 225 which she reports to be somewhat average. Patient had a recurrent episode of her diaphoresis and weakness 4 days prior to admission again this was short-lived. On the day prior to admission patient reports profound persistent shortness of breath, diaphoresis, and headache. Patient states she is unable to sleep the night prior to admission due to a continual sensation of being out of breath. Patient denies any fevers but endorses mild productive cough with whitish phlegm area denies chest pain, nausea, vomiting, rash, diarrhea, dysuria, frequency.   ED Course: Patient found to be profoundly hypoxic with O2 saturations in the 70s. Placed on BiPAP at [facility] prior to transfer. Patient given 20 mg of IV Lasix and her home antihypertensives.    Subjective: 9/13 A/O 4, negative CP, negative SOB, negative abdominal pain, negative N/V   Assessment & Plan:   Active Problems:   Essential (primary) hypertension   HLD (hyperlipidemia)   Type 2 diabetes mellitus with peripheral angiopathy (HCC)   Acute  kidney injury superimposed on chronic kidney disease (HCC)   Acute diastolic CHF (congestive heart failure) (HCC)   Acute respiratory failure (HCC)   Acute pulmonary edema (HCC)   Non-ST elevation (NSTEMI) myocardial infarction (HCC)   Hypokalemia   Acute Diastolic CHF -multifactorial uncontrolled HTN, uncontrolled diabetes -Echocardiogram consistent with CHF see results below -9/11 PCXR: Consistent with fluid overload -Strict I&O since admission -3 L -Daily weight Filed Weights   09/15/16 0439 09/16/16 0500 09/17/16 0440  Weight: 201 lb 1.6 oz (91.2 kg) 199 lb 4.8 oz (90.4 kg) 197 lb 8 oz (89.6 kg)  -S/P cardiac catheterization multivessel disease. Medical management  -Post catheterization gentle hydration normal saline 56ml/hr -Amlodipine 10 mg daily - ASA 81 mg daily -9/13 increase Lipitor 80 mg daily -Plavix 75 mg daily -Coreg 25 mg BID -Hydralazine 50 mg TID -Imdur 60 mg daily  NSTEMI -See CHF    Recent Labs Lab 09/14/16 0809 09/14/16 1906 09/15/16 0016 09/15/16 0649  TROPONINI 1.05* 2.00* 1.73* 1.84*   PAD  -S/P LEFT transmetatarsal application -See CHF  Uncontrolled HTN -See CHF   Acute on CKD stageIII(baseline Cr 1.3)  - Counseled patient at length that we were holding her Januvia and glipizide secondary to her acute on chronic renal failure. Can cause/exacerbate CHF, renal failure. Counseled patient at length we are trying to maximize her renal function in order for her to obtain cardiac catheterization.-Patient feels we are trying to force her to use treatments (i.e. insulin) which she does not want to use. Informed patient we could not force her to use insulin however would not administer her PO diabetic  medication as this could worsen her renal failure. Gave her the option of checking out [facility]  Recent Labs Lab 09/14/16 0809 09/15/16 0649 09/15/16 1442 09/16/16 0559 09/17/16 0236  CREATININE 1.79* 1.62* 1.75* 1.67* 1.73*   Diabetes type 2 uncontrolled  with renal complications  -7/56 Hemoglobin A1c= 8.8 -Patient uncontrolled on 2 PO medications per ADA guidelines patient should start on insulin. Unfortunately patient adamantly opposed to insulin even when it's explained to patient that her worsening renal failure precludes her from using PO medication. -9/13 increase Lantus 12 units daily -9/13 decrease sensitive SSI    Diabetic Neuropathy -Continue Neurontin 300 mg TID  Goals of care -Patient will require Endocrinology, Cardiology, Nephrology follow-up appointments parted discharge   DVT prophylaxis: Heparin drip Code Status: Full Family Communication: None Disposition Plan: TBD   Consultants:  Cardiology    Procedures/Significant Events:  Echocardiogram: LVEF 55-60% akinesis of nasal-mid inferolateral myocardium -Grade 2 diastolic dysfunction    I have personally reviewed and interpreted all radiology studies and my findings are as above.  VENTILATOR SETTINGS: None   Cultures None  Antimicrobials: None   Devices None   LINES / TUBES:      Continuous Infusions: . sodium chloride    . sodium chloride    . sodium chloride 10 mL/hr at 09/17/16 0523  . heparin 1,400 Units/hr (09/17/16 0348)     Objective: Vitals:   09/16/16 2005 09/16/16 2359 09/17/16 0440 09/17/16 0857  BP: 140/61 (!) 150/64 (!) 146/58 (!) 161/65  Pulse: 71 77 (!) 56   Resp: (!) 22 19 14    Temp: 98.1 F (36.7 C) 99 F (37.2 C) 99 F (37.2 C)   TempSrc: Oral Oral Oral   SpO2: 100% 100% 100%   Weight:   197 lb 8 oz (89.6 kg)   Height:        Intake/Output Summary (Last 24 hours) at 09/17/16 0950 Last data filed at 09/17/16 0900  Gross per 24 hour  Intake           536.97 ml  Output             1025 ml  Net          -488.03 ml   Filed Weights   09/15/16 0439 09/16/16 0500 09/17/16 0440  Weight: 201 lb 1.6 oz (91.2 kg) 199 lb 4.8 oz (90.4 kg) 197 lb 8 oz (89.6 kg)    Physical Exam:  General: A/O 4, No acute  respiratory distress Neck:  Negative scars, masses, torticollis, lymphadenopathy, JVD Lungs: Clear to auscultation bilaterally without wheezes or crackles Cardiovascular: Regular rate and rhythm without murmur gallop or rub normal S1 and S2 Abdomen: negative abdominal pain, nondistended, positive soft, bowel sounds, no rebound, no ascites, no appreciable mass Extremities: No significant cyanosis, clubbing, or edema bilateral lower extremities Psychiatric:  Negative depression, negative anxiety, negative fatigue, negative mania, extremely poor insight into multiple medical problems  Central nervous system:  Cranial nerves II through XII intact, tongue/uvula midline, all extremities muscle strength 5/5, sensation intact throughout,  negative dysarthria, negative expressive aphasia, negative receptive aphasia.  .     Data Reviewed: Care during the described time interval was provided by me .  I have reviewed this patient's available data, including medical history, events of note, physical examination, and all test results as part of my evaluation.   CBC:  Recent Labs Lab 09/14/16 0809 09/15/16 0649 09/16/16 0559 09/17/16 0236  WBC 16.3* 10.8* 9.1 9.7  NEUTROABS 13.8*  --   --   --  HGB 14.2 10.7* 11.0* 10.4*  HCT 42.8 33.7* 34.7* 33.6*  MCV 80.9 82.4 81.8 82.8  PLT 329 238 209 716   Basic Metabolic Panel:  Recent Labs Lab 09/14/16 0809 09/14/16 1906 09/15/16 0649 09/15/16 1442 09/16/16 0559 09/17/16 0236  NA 139  --  139 137 137 137  K 3.6  --  2.8* 3.2* 4.0 4.0  CL 103  --  103 99* 106 103  CO2 27  --  28 30 28 27   GLUCOSE 287*  --  138* 238* 187* 160*  BUN 15  --  10 11 10 11   CREATININE 1.79*  --  1.62* 1.75* 1.67* 1.73*  CALCIUM 9.1  --  8.0* 8.2* 8.2* 8.5*  MG  --  1.7  --   --  1.7  --    GFR: Estimated Creatinine Clearance: 38.4 mL/min (A) (by C-G formula based on SCr of 1.73 mg/dL (H)). Liver Function Tests:  Recent Labs Lab 09/14/16 0809 09/16/16 0559    AST 25 16  ALT 21 14  ALKPHOS 131* 79  BILITOT 0.4 0.5  PROT 8.1 5.8*  ALBUMIN 3.2* 2.2*    Recent Labs Lab 09/14/16 0809  LIPASE 28   No results for input(s): AMMONIA in the last 168 hours. Coagulation Profile:  Recent Labs Lab 09/17/16 0236  INR 1.00   Cardiac Enzymes:  Recent Labs Lab 09/14/16 0809 09/14/16 1906 09/15/16 0016 09/15/16 0649  TROPONINI 1.05* 2.00* 1.73* 1.84*   BNP (last 3 results) No results for input(s): PROBNP in the last 8760 hours. HbA1C:  Recent Labs  09/14/16 1906  HGBA1C 8.8*   CBG:  Recent Labs Lab 09/16/16 0846 09/16/16 1246 09/16/16 1643 09/16/16 2101 09/17/16 0826  GLUCAP 167* 175* 146* 200* 163*   Lipid Profile:  Recent Labs  09/16/16 0559  CHOL 93  HDL 25*  LDLCALC 35  TRIG 163*  CHOLHDL 3.7   Thyroid Function Tests:  Recent Labs  09/14/16 1906  TSH 0.959   Anemia Panel: No results for input(s): VITAMINB12, FOLATE, FERRITIN, TIBC, IRON, RETICCTPCT in the last 72 hours. Urine analysis:    Component Value Date/Time   COLORURINE STRAW (A) 09/15/2016 2221   APPEARANCEUR CLEAR 09/15/2016 2221   LABSPEC 1.005 09/15/2016 2221   PHURINE 7.0 09/15/2016 2221   GLUCOSEU NEGATIVE 09/15/2016 2221   HGBUR NEGATIVE 09/15/2016 2221   BILIRUBINUR NEGATIVE 09/15/2016 2221   KETONESUR NEGATIVE 09/15/2016 2221   PROTEINUR >=300 (A) 09/15/2016 2221   NITRITE NEGATIVE 09/15/2016 2221   LEUKOCYTESUR NEGATIVE 09/15/2016 2221   Sepsis Labs: @LABRCNTIP (procalcitonin:4,lacticidven:4)  )No results found for this or any previous visit (from the past 240 hour(s)).       Radiology Studies: No results found.      Scheduled Meds: . amLODipine  10 mg Oral Daily  . aspirin EC  81 mg Oral Daily  . atorvastatin  40 mg Oral Daily  . carvedilol  25 mg Oral BID WC  . cholecalciferol  1,000 Units Oral Daily  . clopidogrel  75 mg Oral Daily  . docusate sodium  100 mg Oral BID  . gabapentin  300 mg Oral TID  .  hydrALAZINE  50 mg Oral TID  . insulin aspart  0-15 Units Subcutaneous TID WC  . insulin aspart  0-5 Units Subcutaneous QHS  . insulin glargine  8 Units Subcutaneous QHS  . isosorbide mononitrate  60 mg Oral Daily  . sodium chloride flush  3 mL Intravenous Q12H  . sodium chloride  flush  3 mL Intravenous Q12H   Continuous Infusions: . sodium chloride    . sodium chloride    . sodium chloride 10 mL/hr at 09/17/16 0523  . heparin 1,400 Units/hr (09/17/16 0348)     LOS: 3 days    Time spent: 40 minutes    Orel Cooler, Geraldo Docker, MD [facility] Pager [phone]   If 7PM-7AM, please contact night-coverage [email or web address] Password [email or web address] 09/17/2016, 9:50 AM

## 2016-09-17 NOTE — Progress Notes (Signed)
ANTICOAGULATION CONSULT NOTE - Follow Up Consult  Pharmacy Consult for heparin Indication: NSTEMI  Labs:  Recent Labs  09/14/16 1906 09/15/16 0016 09/15/16 0649 09/15/16 1442 09/15/16 1632 09/16/16 0559 09/17/16 0236  HGB  --   --  10.7*  --   --  11.0* 10.4*  HCT  --   --  33.7*  --   --  34.7* 33.6*  PLT  --   --  238  --   --  209 212  LABPROT  --   --   --   --   --   --  13.1  INR  --   --   --   --   --   --  1.00  HEPARINUNFRC  --   --   --   --  0.27* 0.36 0.27*  CREATININE  --   --  1.62* 1.75*  --  1.67* 1.73*  TROPONINI 2.00* 1.73* 1.84*  --   --   --   --     Assessment: 56yo female now subtherapeutic on heparin after one level at lower end of goal; plan for cath today.  Goal of Therapy:  Heparin level 0.3-0.7 units/ml   Plan:  Will increase heparin gtt by 1-2 units/kg/hr to 1400 units/hr and f/u after cath.  Wynona Neat, PharmD, BCPS  09/17/2016,3:44 AM

## 2016-09-18 DIAGNOSIS — I110 Hypertensive heart disease with heart failure: Secondary | ICD-10-CM

## 2016-09-18 DIAGNOSIS — I509 Heart failure, unspecified: Secondary | ICD-10-CM

## 2016-09-18 LAB — BASIC METABOLIC PANEL
Anion gap: 7 (ref 5–15)
BUN: 10 mg/dL (ref 6–20)
CHLORIDE: 106 mmol/L (ref 101–111)
CO2: 23 mmol/L (ref 22–32)
CREATININE: 1.67 mg/dL — AB (ref 0.44–1.00)
Calcium: 8.5 mg/dL — ABNORMAL LOW (ref 8.9–10.3)
GFR calc Af Amer: 38 mL/min — ABNORMAL LOW (ref >60)
GFR calc non Af Amer: 33 mL/min — ABNORMAL LOW (ref >60)
GLUCOSE: 137 mg/dL — AB (ref 65–99)
Potassium: 3.7 mmol/L (ref 3.5–5.1)
SODIUM: 136 mmol/L (ref 135–145)

## 2016-09-18 LAB — CBC
HCT: 33.6 % — ABNORMAL LOW (ref 36.0–46.0)
Hemoglobin: 10.8 g/dL — ABNORMAL LOW (ref 12.0–15.0)
MCH: 26.5 pg (ref 26.0–34.0)
MCHC: 32.1 g/dL (ref 30.0–36.0)
MCV: 82.6 fL (ref 78.0–100.0)
PLATELETS: 201 10*3/uL (ref 150–400)
RBC: 4.07 MIL/uL (ref 3.87–5.11)
RDW: 13.7 % (ref 11.5–15.5)
WBC: 8.3 10*3/uL (ref 4.0–10.5)

## 2016-09-18 LAB — GLUCOSE, CAPILLARY
Glucose-Capillary: 131 mg/dL — ABNORMAL HIGH (ref 65–99)
Glucose-Capillary: 146 mg/dL — ABNORMAL HIGH (ref 65–99)
Glucose-Capillary: 141 mg/dL — ABNORMAL HIGH (ref 65–99)
Glucose-Capillary: 143 mg/dL — ABNORMAL HIGH (ref 65–99)
Glucose-Capillary: 153 mg/dL — ABNORMAL HIGH (ref 65–99)

## 2016-09-18 MED ORDER — CARVEDILOL 12.5 MG PO TABS
12.5000 mg | ORAL_TABLET | Freq: Two times a day (BID) | ORAL | Status: DC
Start: 2016-09-18 — End: 2016-09-18

## 2016-09-18 MED ORDER — GABAPENTIN 300 MG PO CAPS: 300.0000 mg | ORAL_CAPSULE | Freq: Three times a day (TID) | ORAL | Status: DC

## 2016-09-18 MED ORDER — ATORVASTATIN CALCIUM 80 MG PO TABS: 80.0000 mg | ORAL_TABLET | Freq: Every day | ORAL | 0 refills | Status: DC

## 2016-09-18 MED ORDER — HYDRALAZINE HCL 50 MG PO TABS: 50.0000 mg | ORAL_TABLET | Freq: Three times a day (TID) | ORAL | 0 refills | Status: DC

## 2016-09-18 MED ORDER — CARVEDILOL 6.25 MG PO TABS: 6.2500 mg | ORAL_TABLET | Freq: Two times a day (BID) | ORAL | 11 refills | Status: DC

## 2016-09-18 NOTE — Discharge Summary (Addendum)
DISCHARGE SUMMARY  Rebekah Johnson  MR#: [id number]  DOB:1960/03/24  Date of Admission: 09/14/2016 Date of Discharge: 09/18/2016  Attending Physician:MCCLUNG,JEFFREY T  Patient's DJS:[name], Rebekah Ferretti, MD  Consults:  [facility] Cardiology   Disposition: D/C home   Follow-up Appts: Follow-up Information    Rebekah Morale, MD Follow up in 5 day(s).   Specialty:  Family Medicine Contact information: [place] [address] [phone]        Dorothy Spark, MD. Schedule an appointment as soon as possible for a visit in 1 week(s).   Specialty:  Cardiology Contact information: [place] [address] [phone]           Tests Needing Follow-up: -reassess BP control -encourage compliance w/ all meds -consider Nephrology consultation and eval for possible nephrotic syndrome  -reassess DM control and potential need for insulin  -assure pt is following up w/ Cardiology   Discharge Diagnoses: Acute diastolic CHF exacerbation Malignant hypertension NSTEMI - multivessel CAD Uncontrolled DM2 Acute renal injury on CKD Stage 3 Peripheral arterial disease  Initial presentation: 56yo F w/ a hx of HTN, HLD, PAD S/P vascular recanalization, DM2, and L transmetatarsal amputation who ran out of her meds 2 weeks prior to this admission. She noted that her blood pressure was running as high as 225/105 and her glucose was in the 300 range. She came to the ED reporting profound persistent shortness of breath, diaphoresis, and headache.   In the ED she was found to be profoundly hypoxic with O2 saturations in the 70s.  Hospital Course:  Acute diastolic CHF exacerbation EF 55-60 percent with grade 2 diastolic dysfunction - Cardiology directed diuretic tx th/o hospital stay - net negative ~3.6L at time of d/c - clinically no significant clinical evidence of volume overload at time of exam  Malignant hypertension Blood pressure much  improved following medication titration and diuresis in the hospital - no ACE given kidney disease   NSTEMI - multivessel CAD Cards directed care during hospital stay - cardiac cath 9/13 noted severe diffuse disease of the RCA, Ramus and circumflex - lesions were not amenable to stenting and medical tx is recommended - cont ASA, BB, statin - importance of major risk factor reduction stressed to pt   Uncontrolled DM2 A1c 8.8 - pt refusing to consider use of insulin at home - resume oral med tx at time of d/c, but stressed absolute need for dietary compliance - follow CBG as outpt as initiation of insulin may soon be required   Acute renal injury on CKD Stage 3 Baseline crt 1.3 - creatinine did not tolerate diuresis well - will need ongoing outpt attention to her renal fxn, w/ consideration of consult to Nephrology as pt had a high protein concentration in her urine sample in the setting of DM   Recent Labs Lab 09/15/16 0649 09/15/16 1442 09/16/16 0559 09/17/16 0236 09/18/16 0133  CREATININE 1.62* 1.75* 1.67* 1.73* 1.67*   Peripheral arterial disease status post left SFA and popliteal stent and left SFA PTA with drug coated balloon in 08/2015  -s/p L foot TMA  Recurrent HA Pt reported persistent HA during the later portion of her hospital stay - on detailed hx she admitted that this generalized frontal HA was not new and occurred intermittently at home prior to his admit - though some notes suggest accompanying dizziness, the pt denies this when I ask her directly - she tells me she is ambulating w/o difficulty and feels stable  on her feet - she denies focal weakness, slurring of speech, change of vision, or difficulty swallowing - her neur exam (below) is normal - I suspect her HA are related to her HTN - she desires d/c home today, and I feel this is reasonable    Allergies as of 09/18/2016   No Known Allergies     Medication List    STOP taking these medications   ibuprofen 400  MG tablet Commonly known as:  ADVIL,MOTRIN   lisinopril 20 MG tablet Commonly known as:  PRINIVIL,ZESTRIL     TAKE these medications   ACCU-CHEK AVIVA device Use as instructed three times daily.   accu-chek softclix lancets Use as instructed three times daily before meals.   amLODipine 10 MG tablet Commonly known as:  NORVASC Take 1 tablet (10 mg total) by mouth daily. Reported on 07/13/2015   aspirin 81 MG EC tablet Take 1 tablet (81 mg total) by mouth daily. Reported on 07/13/2015   atorvastatin 80 MG tablet Commonly known as:  LIPITOR Take 1 tablet (80 mg total) by mouth daily. What changed:  medication strength  how much to take  additional instructions   blood glucose meter kit and supplies Kit Dispense based on patient and insurance preference. Use up to four times daily as directed. (FOR ICD-9 250.00, 250.01).   carvedilol 6.25 MG tablet Commonly known as:  COREG Take 1 tablet (6.25 mg total) by mouth 2 (two) times daily. What changed:  medication strength  how much to take  when to take this   clopidogrel 75 MG tablet Commonly known as:  PLAVIX Take 1 tablet (75 mg total) by mouth daily.   docusate sodium 100 MG capsule Commonly known as:  COLACE TAKE 1 CAPSULE BY MOUTH TWICE A DAY What changed:  See the new instructions.   Fish Oil 1000 MG Caps Take 1,000 mg by mouth daily. Reported on 07/13/2015   gabapentin 300 MG capsule Commonly known as:  NEURONTIN Take 1 capsule (300 mg total) by mouth 3 (three) times daily.   glipiZIDE 10 MG tablet Commonly known as:  GLUCOTROL Take 1 tablet (10 mg total) by mouth 2 (two) times daily before a meal.   glucose blood test strip Commonly known as:  ACCU-CHEK AVIVA Use as instructed three times daily before meals.   hydrALAZINE 50 MG tablet Commonly known as:  APRESOLINE Take 1 tablet (50 mg total) by mouth 3 (three) times daily.   isosorbide mononitrate 60 MG 24 hr tablet Commonly known as:  IMDUR Take  1 tablet (60 mg total) by mouth daily.   sitaGLIPtin 50 MG tablet Commonly known as:  JANUVIA Take 1 tablet (50 mg total) by mouth daily.   Vitamin D-3 1000 units Caps Take 1,000 Units by mouth daily.            Discharge Care Instructions        Start     Ordered   09/19/16 0000  atorvastatin (LIPITOR) 80 MG tablet  Daily     09/18/16 1436   09/18/16 0000  gabapentin (NEURONTIN) 300 MG capsule  3 times daily    Comments:  To replace tablets   09/18/16 1436   09/18/16 0000  hydrALAZINE (APRESOLINE) 50 MG tablet  3 times daily     09/18/16 1436   09/18/16 0000  Increase activity slowly     09/18/16 1436   09/18/16 0000  Diet - low sodium heart healthy     09/18/16 1436  09/18/16 0000  Diet Carb Modified     09/18/16 1436   09/18/16 0000  carvedilol (COREG) 6.25 MG tablet  2 times daily    Question:  Supervising Provider  Answer:  Lorretta Harp   09/18/16 1456      Day of Discharge BP (!) 135/55 (BP Location: Right Arm)   Pulse (!) 51   Temp 97.6 F (36.4 C) (Axillary)   Resp 15   Ht _0  (1.626 m)   Wt 89.9 kg (198 lb 3.2 oz)   SpO2 97%   BMI 34.02 kg/m   Physical Exam: General: No acute respiratory distress Lungs: Clear to auscultation bilaterally without wheezes or crackles Cardiovascular: Regular rate and rhythm without murmur gallop or rub normal S1 and S2 Abdomen: Nontender, nondistended, soft, bowel sounds positive, no rebound, no ascites, no appreciable mass Extremities: No significant cyanosis, clubbing, or edema bilateral lower extremities Neuro:  Alert and oriented x4 - CN2-12 intact B, 5/5 strength B U&LE, intact sensation to touch th/o   Basic Metabolic Panel:  Recent Labs Lab 09/14/16 1906 09/15/16 0649 09/15/16 1442 09/16/16 0559 09/17/16 0236 09/18/16 0133  NA  --  139 137 137 137 136  K  --  2.8* 3.2* 4.0 4.0 3.7  CL  --  103 99* 106 103 106  CO2  --  _1 GLUCOSE  --  138* 238* 187* 160* 137*  BUN  --  _2 CREATININE  --  1.62* 1.75* 1.67* 1.73* 1.67*  CALCIUM  --  8.0* 8.2* 8.2* 8.5* 8.5*  MG 1.7  --   --  1.7  --   --     Liver Function Tests:  Recent Labs Lab 09/14/16 0809 09/16/16 0559  AST 25 16  ALT 21 14  ALKPHOS 131* 79  BILITOT 0.4 0.5  PROT 8.1 5.8*  ALBUMIN 3.2* 2.2*    Recent Labs Lab 09/14/16 0809  LIPASE 28   Coags:  Recent Labs Lab 09/17/16 0236  INR 1.00   CBC:  Recent Labs Lab 09/14/16 0809 09/15/16 0649 09/16/16 0559 09/17/16 0236 09/18/16 0133  WBC 16.3* 10.8* 9.1 9.7 8.3  NEUTROABS 13.8*  --   --   --   --   HGB 14.2 10.7* 11.0* 10.4* 10.8*  HCT 42.8 33.7* 34.7* 33.6* 33.6*  MCV 80.9 82.4 81.8 82.8 82.6  PLT 329 238 209 212 201    Cardiac Enzymes:  Recent Labs Lab 09/14/16 0809 09/14/16 1906 09/15/16 0016 09/15/16 0649  TROPONINI 1.05* 2.00* 1.73* 1.84*   BNP (last 3 results)  Recent Labs  09/14/16 0809  BNP 549.0*    CBG:  Recent Labs Lab 09/17/16 2111 09/18/16 0117 09/18/16 0454 09/18/16 0722 09/18/16 1121  GLUCAP 127* 131* 146* 141* 143*    Time spent in discharge (includes decision making & examination of pt): 35 minutes  09/18/2016, 4:13 PM   Cherene Altes, MD [facility] Office  [phone] Pager [phone]  On-Call/Text Page:      [email or web address]      password [facility]

## 2016-09-18 NOTE — Discharge Instructions (Signed)
Coronary Artery Disease, Female Coronary artery disease (CAD) is a condition in which the arteries that lead to the heart (coronary arteries) become narrow or blocked. The narrowing or blockage can lead to decreased blood flow to the heart. Prolonged reduced blood flow can cause a heart attack (myocardial infarction or MI). This condition may also be called coronary heart disease. Because CAD is the leading cause of death in women, it is important to understand what causes this condition and how it is treated. What are the causes? CAD is most often caused by atherosclerosis. This is the buildup of fat and cholesterol (plaque) on the inside of the arteries. Over time, the plaque may narrow or block the artery, reducing blood flow to the heart. Plaque can also become weak and break off within a coronary artery and cause a sudden blockage. Other less common causes of CAD include:  An embolism or blood clot in a coronary artery.  A tearing of the artery (spontaneous coronary artery dissection).  An aneurysm.  Inflammation (vasculitis) in the artery wall.  What increases the risk? The following factors may make you more likely to develop this condition:  Age. Women over age 34 are at a greater risk of CAD.  Family history of CAD.  High blood pressure (hypertension).  Diabetes.  High cholesterol levels.  Tobacco use.  Lack of exercise.  Menopause. ? All postmenopausal women are at greater risk of CAD. ? Women who have experienced menopause between the ages of 3-45 (early menopause) are at a higher risk of CAD. ? Women who have experienced menopause before age 57 (premature menopause) are at a very high risk of CAD.  Excessive alcohol use  A diet high in saturated and trans fats, such as fried food and processed meat.  Other possible risk factors include:  High stress levels.  Depression  Obesity.  Sleep apnea.  What are the signs or symptoms? Many people do not have any  symptoms during the early stages of CAD. As the condition progresses, symptoms may include:  Chest pain (angina). The pain can: ? Feel like crushing or squeezing, or a tightness, pressure, fullness, or heaviness in the chest. ? Last more than a few minutes or can stop and recur. The pain tends to get worse with exercise or stress and to fade with rest.  Pain in the arms, neck, jaw, or back.  Unexplained heartburn or indigestion.  Shortness of breath.  Nausea.  Sudden cold sweats.  Sudden light-headedness.  Fluttering or fast heartbeat (palpitations).  Many women have chest discomfort and the other symptoms. However, women often have unusual (atypical) symptoms, such as:  Fatigue.  Vomiting.  Unexplained feelings of nervousness or anxiety.  Unexplained weakness.  Dizziness or fainting.  How is this diagnosed? This condition is diagnosed based on:  Your family and medical history.  A physical exam.  Tests, including: ? A test to check the electrical signals in your heart (electrocardiogram). ? Exercise stress test. This looks for signs of blockage when the heart is stressed with exercise, such as running on a treadmill. ? Pharmacologic stress test. This test looks for signs of blockage when the heart is being stressed with a medicine. ? Blood tests. ? Coronary angiogram. This is a procedure to look at the coronary arteries to see if there is any blockage. During this test, a dye is injected into your arteries so they appear on an X-ray. ? A test that uses sound waves to take a picture of  your heart (echocardiogram). °? Chest X-ray. ° °How is this treated? °This condition may be treated by: °· Healthy lifestyle changes to reduce risk factors. °· Medicines such as: °? Antiplatelet medicines and blood-thinning medicines, such as aspirin. These help prevent blood clots. °? Nitroglycerin. °? Blood pressure medicines. °? Cholesterol-lowering medicine. °· Coronary angioplasty and  stenting. During this procedure, a thin, flexible tube is inserted through a blood vessel and into a blocked artery. A balloon or similar device on the end of the tube is inflated to open up the artery. In some cases, a small, mesh tube (stent) is inserted into the artery to keep it open. °· Coronary artery bypass surgery. During this surgery, veins or arteries from other parts of the body are used to create a bypass around the blockage and allow blood to reach your heart. ° °Follow these instructions at home: °Medicines °· Take over-the-counter and prescription medicines only as told by your health care provider. °· Do not take the following medicines unless your health care provider approves: °? NSAIDs, such as ibuprofen, naproxen, or celecoxib. °? Vitamin supplements that contain vitamin A, vitamin E, or both. °? Hormone replacement therapy that contains estrogen with or without progestin. °Lifestyle °· Follow an exercise program approved by your health care provider. Aim for 150 minutes of moderate exercise or 75 minutes of vigorous exercise each week. °· Maintain a healthy weight or lose weight as approved by your health care provider. °· Rest when you are tired. °· Learn to manage stress or try to limit your stress. Ask your health care provider for suggestions if you need help. °· Get screened for depression and seek treatment, if needed. °· Do not use any products that contain nicotine or tobacco, such as cigarettes and e-cigarettes. If you need help quitting, ask your health care provider. °· Do not use illegal drugs. °Eating and drinking °· Follow a heart-healthy diet. A dietitian can help educate you about healthy food options and changes. In general, eat plenty of fruits and vegetables, lean meats, and whole grains. °· Avoid foods high in: °? Sugar. °? Salt (sodium). °? Saturated fats, such as processed or fatty meat. °? Trans fats, such as fried food. °· Use healthy cooking methods such as roasting,  grilling, broiling, baking, poaching, steaming, or stir-frying. °· If you drink alcohol, and your health care provider approves, limit your alcohol intake to no more than 1 drink per day. One drink equals 12 ounces of beer, 5 ounces of wine, or 1½ ounces of hard liquor. °General instructions °· Manage any other health conditions, such as hypertension and diabetes. These conditions affect your heart. °· Your health care provider may ask you to monitor your blood pressure. Ideally, your blood pressure should be below 130/80. °· Keep all follow-up visits as told by your health care provider. This is important. °Get help right away if: °· You have pain in your chest, neck, arm, jaw, stomach, or back that: °? Lasts more than a few minutes. °? Is recurring. °? Is not relieved by taking medicine under your tongue (sublingualnitroglycerin). °· You have profuse sweating without cause. °· You have unexplained: °? Heartburn or indigestion. °? Shortness of breath or difficulty breathing. °? Fluttering or fast heartbeat (palpitations). °? Nausea or vomiting. °? Fatigue. °? Feelings of nervousness or anxiety. °? Weakness. °? Diarrhea. °· You have sudden light-headedness or dizziness. °· You faint. °· You feel like hurting yourself or think about taking your own life. °These symptoms may represent   a serious problem that is an emergency. Do not wait to see if the symptoms will go away. Get medical help right away. Call your local emergency services (911 in the [place] Do not drive yourself to the hospital. Summary  Coronary artery disease (CAD) is a process in which the arteries that lead to the heart (coronary arteries) become narrow or blocked. The narrowing or blockage can lead to a heart attack.  Many women have chest discomfort and other common symptoms of CAD. However, women often have different (atypical) symptoms, such as fatigue, vomiting, and dizziness or weakness.  CAD can be treated with lifestyle changes,  medicines, surgery, or a combination of these treatments. This information is not intended to replace advice given to you by your health care provider. Make sure you discuss any questions you have with your health care provider. Document Released: 03/16/2011 Document Revised: 12/13/2015 Document Reviewed: 12/13/2015 Elsevier Interactive Patient Education  2017 [place].   Hypertension Hypertension is another name for high blood pressure. High blood pressure forces your heart to work harder to pump blood. This can cause problems over time. There are two numbers in a blood pressure reading. There is a top number (systolic) over a bottom number (diastolic). It is best to have a blood pressure below 120/80. Healthy choices can help lower your blood pressure. You may need medicine to help lower your blood pressure if:  Your blood pressure cannot be lowered with healthy choices.  Your blood pressure is higher than 130/80.  Follow these instructions at home: Eating and drinking  If directed, follow the DASH eating plan. This diet includes: ? Filling half of your plate at each meal with fruits and vegetables. ? Filling one quarter of your plate at each meal with whole grains. Whole grains include whole wheat pasta, brown rice, and whole grain bread. ? Eating or drinking low-fat dairy products, such as skim milk or low-fat yogurt. ? Filling one quarter of your plate at each meal with low-fat (lean) proteins. Low-fat proteins include fish, skinless chicken, eggs, beans, and tofu. ? Avoiding fatty meat, cured and processed meat, or chicken with skin. ? Avoiding premade or processed food.  Eat less than 1,500 mg of salt (sodium) a day.  Limit alcohol use to no more than 1 drink a day for nonpregnant women and 2 drinks a day for men. One drink equals 12 oz of beer, 5 oz of wine, or 1 oz of hard liquor. Lifestyle  Work with your doctor to stay at a healthy weight or to lose weight. Ask your  doctor what the best weight is for you.  Get at least 30 minutes of exercise that causes your heart to beat faster (aerobic exercise) most days of the week. This may include walking, swimming, or biking.  Get at least 30 minutes of exercise that strengthens your muscles (resistance exercise) at least 3 days a week. This may include lifting weights or pilates.  Do not use any products that contain nicotine or tobacco. This includes cigarettes and e-cigarettes. If you need help quitting, ask your doctor.  Check your blood pressure at home as told by your doctor.  Keep all follow-up visits as told by your doctor. This is important. Medicines  Take over-the-counter and prescription medicines only as told by your doctor. Follow directions carefully.  Do not skip doses of blood pressure medicine. The medicine does not work as well if you skip doses. Skipping doses also puts you at risk for problems.  Ask your doctor about side effects or reactions to medicines that you should watch for. Contact a doctor if:  You think you are having a reaction to the medicine you are taking.  You have headaches that keep coming back (recurring).  You feel dizzy.  You have swelling in your ankles.  You have trouble with your vision. Get help right away if:  You get a very bad headache.  You start to feel confused.  You feel weak or numb.  You feel faint.  You get very bad pain in your: ? Chest. ? Belly (abdomen).  You throw up (vomit) more than once.  You have trouble breathing. Summary  Hypertension is another name for high blood pressure.  Making healthy choices can help lower blood pressure. If your blood pressure cannot be controlled with healthy choices, you may need to take medicine. This information is not intended to replace advice given to you by your health care provider. Make sure you discuss any questions you have with your health care provider. Document Released: 06/10/2007  Document Revised: 11/20/2015 Document Reviewed: 11/20/2015 Elsevier Interactive Patient Education  2018 [employer].  Diabetes Mellitus and Food It is important for you to manage your blood sugar (glucose) level. Your blood glucose level can be greatly affected by what you eat. Eating healthier foods in the appropriate amounts throughout the day at about the same time each day will help you control your blood glucose level. It can also help slow or prevent worsening of your diabetes mellitus. Healthy eating may even help you improve the level of your blood pressure and reach or maintain a healthy weight. General recommendations for healthful eating and cooking habits include:  Eating meals and snacks regularly. Avoid going long periods of time without eating to lose weight.  Eating a diet that consists mainly of plant-based foods, such as fruits, vegetables, nuts, legumes, and whole grains.  Using low-heat cooking methods, such as baking, instead of high-heat cooking methods, such as deep frying.  Work with your dietitian to make sure you understand how to use the Nutrition Facts information on food labels. How can food affect me? Carbohydrates Carbohydrates affect your blood glucose level more than any other type of food. Your dietitian will help you determine how many carbohydrates to eat at each meal and teach you how to count carbohydrates. Counting carbohydrates is important to keep your blood glucose at a healthy level, especially if you are using insulin or taking certain medicines for diabetes mellitus. Alcohol Alcohol can cause sudden decreases in blood glucose (hypoglycemia), especially if you use insulin or take certain medicines for diabetes mellitus. Hypoglycemia can be a life-threatening condition. Symptoms of hypoglycemia (sleepiness, dizziness, and disorientation) are similar to symptoms of having too much alcohol. If your health care provider has given you approval to drink  alcohol, do so in moderation and use the following guidelines:  Women should not have more than one drink per day, and men should not have more than two drinks per day. One drink is equal to: ? 12 oz of beer. ? 5 oz of wine. ? 1 oz of hard liquor.  Do not drink on an empty stomach.  Keep yourself hydrated. Have water, diet soda, or unsweetened iced tea.  Regular soda, juice, and other mixers might contain a lot of carbohydrates and should be counted.  What foods are not recommended? As you make food choices, it is important to remember that all foods are not the same. Some  foods have fewer nutrients per serving than other foods, even though they might have the same number of calories or carbohydrates. It is difficult to get your body what it needs when you eat foods with fewer nutrients. Examples of foods that you should avoid that are high in calories and carbohydrates but low in nutrients include:  Trans fats (most processed foods list trans fats on the Nutrition Facts label).  Regular soda.  Juice.  Candy.  Sweets, such as cake, pie, doughnuts, and cookies.  Fried foods.  What foods can I eat? Eat nutrient-rich foods, which will nourish your body and keep you healthy. The food you should eat also will depend on several factors, including:  The calories you need.  The medicines you take.  Your weight.  Your blood glucose level.  Your blood pressure level.  Your cholesterol level.  You should eat a variety of foods, including:  Protein. ? Lean cuts of meat. ? Proteins low in saturated fats, such as fish, egg whites, and beans. Avoid processed meats.  Fruits and vegetables. ? Fruits and vegetables that may help control blood glucose levels, such as apples, mangoes, and yams.  Dairy products. ? Choose fat-free or low-fat dairy products, such as milk, yogurt, and cheese.  Grains, bread, pasta, and rice. ? Choose whole grain products, such as multigrain bread,  whole oats, and brown rice. These foods may help control blood pressure.  Fats. ? Foods containing healthful fats, such as nuts, avocado, olive oil, canola oil, and fish.  Does everyone with diabetes mellitus have the same meal plan? Because every person with diabetes mellitus is different, there is not one meal plan that works for everyone. It is very important that you meet with a dietitian who will help you create a meal plan that is just right for you. This information is not intended to replace advice given to you by your health care provider. Make sure you discuss any questions you have with your health care provider. Document Released: 09/18/2004 Document Revised: 05/30/2015 Document Reviewed: 11/18/2012 Elsevier Interactive Patient Education  2017 [employer].

## 2016-09-18 NOTE — Progress Notes (Addendum)
Progress Note  Patient Name: Rebekah Johnson Date of Encounter: 09/18/2016  Primary Cardiologist: Dr. [name]  Subjective   + lightheadedness when getting up, not feeling well today, no chest pain or SOB.   Inpatient Medications    Scheduled Meds: . amLODipine  10 mg Oral Daily  . aspirin EC  81 mg Oral Daily  . atorvastatin  80 mg Oral Daily  . cholecalciferol  1,000 Units Oral Daily  . clopidogrel  75 mg Oral Daily  . docusate sodium  100 mg Oral BID  . gabapentin  300 mg Oral TID  . heparin  5,000 Units Subcutaneous Q8H  . hydrALAZINE  50 mg Oral TID  . insulin aspart  0-9 Units Subcutaneous Q4H  . isosorbide mononitrate  60 mg Oral Daily  . sodium chloride flush  3 mL Intravenous Q12H   Continuous Infusions:  PRN Meds: acetaminophen, Influenza vac split quadrivalent PF, ondansetron (ZOFRAN) IV, ondansetron **OR** [DISCONTINUED] ondansetron (ZOFRAN) IV, sodium chloride flush, traMADol   Vital Signs    Vitals:   09/18/16 0748 09/18/16 0854 09/18/16 1127 09/18/16 1253  BP: (!) 163/65  (!) 159/69 (!) 135/55  Pulse: 64  (!) 51 (!) 51  Resp: (!) 21  15   Temp:  98.1 F (36.7 C)  97.6 F (36.4 C)  TempSrc:  Oral  Axillary  SpO2: 96%  97% 97%  Weight:      Height:        Intake/Output Summary (Last 24 hours) at 09/18/16 1454 Last data filed at 09/18/16 1144  Gross per 24 hour  Intake              540 ml  Output             1250 ml  Net             -710 ml   Filed Weights   09/16/16 0500 09/17/16 0440 09/18/16 0331  Weight: 199 lb 4.8 oz (90.4 kg) 197 lb 8 oz (89.6 kg) 198 lb 3.2 oz (89.9 kg)    Telemetry    SR to SB to upper 40 with occ pauses - Personally Reviewed  ECG    No new - Personally Reviewed  Physical Exam   GEN: No acute distress.   Neck: No JVD Cardiac: RRR, no murmurs, rubs, or gallops. Rt wrist cath site stable  Respiratory: Clear to auscultation bilaterally. GI: Soft, nontender, non-distended  MS: No edema; No deformity. Neuro:   Nonfocal, MAE, follows commands tongue midline, + facial symmetry. Grips equal.  Psych: Normal affect   Labs    Chemistry Recent Labs Lab 09/14/16 0809  09/16/16 0559 09/17/16 0236 09/18/16 0133  NA 139  < > 137 137 136  K 3.6  < > 4.0 4.0 3.7  CL 103  < > 106 103 106  CO2 27  < > 28 27 23   GLUCOSE 287*  < > 187* 160* 137*  BUN 15  < > 10 11 10   CREATININE 1.79*  < > 1.67* 1.73* 1.67*  CALCIUM 9.1  < > 8.2* 8.5* 8.5*  PROT 8.1  --  5.8*  --   --   ALBUMIN 3.2*  --  2.2*  --   --   AST 25  --  16  --   --   ALT 21  --  14  --   --   ALKPHOS 131*  --  79  --   --   BILITOT 0.4  --  0.5  --   --   GFRNONAA 30*  < > 33* 31* 33*  GFRAA 35*  < > 38* 36* 38*  ANIONGAP 9  < > 3* 7 7  < > = values in this interval not displayed.   Hematology  Recent Labs Lab 09/16/16 0559 09/17/16 0236 09/18/16 0133  WBC 9.1 9.7 8.3  RBC 4.24 4.06 4.07  HGB 11.0* 10.4* 10.8*  HCT 34.7* 33.6* 33.6*  MCV 81.8 82.8 82.6  MCH 25.9* 25.6* 26.5  MCHC 31.7 31.0 32.1  RDW 13.8 13.8 13.7  PLT 209 212 201    Cardiac Enzymes  Recent Labs Lab 09/14/16 0809 09/14/16 1906 09/15/16 0016 09/15/16 0649  TROPONINI 1.05* 2.00* 1.73* 1.84*   No results for input(s): TROPIPOC in the last 168 hours.   BNP  Recent Labs Lab 09/14/16 0809  BNP 549.0*     DDimer No results for input(s): DDIMER in the last 168 hours.   Radiology    No results found.  Cardiac Studies   Procedures   LEFT HEART CATH AND CORONARY ANGIOGRAPHY  Conclusion     Ost 1st Diag lesion, 25 %stenosed.  Ost 2nd Diag lesion, 25 %stenosed.  Ramus-1 lesion, 50 %stenosed.  Ramus-2 lesion, 80 %stenosed.  Prox Cx to Mid Cx lesion, 100 %stenosed.  Prox RCA to Mid RCA lesion, 80 %stenosed.  RPDA lesion, 80 %stenosed.  Dist RCA lesion, 80 %stenosed.  Mid RCA to Dist RCA lesion, 80 %stenosed.  LV end diastolic pressure is mildly elevated.  There is no aortic valve stenosis.   Severe diffuse disease of the  RCA, Ramus and circumflex.  LAD with only mild to moderate diffuse disease.  PCI would require multiple stents and significant contrast usage.  For now, would treat medically.       Echo 09/15/16 Study Conclusions  - Left ventricle: The cavity size was normal. Wall thickness was   increased in a pattern of mild LVH. Systolic function was normal.   The estimated ejection fraction was in the range of 55% to 60%.   There is akinesis of the basal-midinferolateral myocardium.   Features are consistent with a pseudonormal left ventricular   filling pattern, with concomitant abnormal relaxation and   increased filling pressure (grade 2 diastolic dysfunction).   Doppler parameters are consistent with high ventricular filling   pressure. - Mitral valve: There was mild regurgitation. - Left atrium: The atrium was mildly dilated.  Impressions:  - Akinesis of the basal/mid inferolateral wall with overall   preserved LV systolic function; moderate diastolic dysfunction;   mild LVH; mild MR; mild LAE.   Patient Profile     56 y.o. female with HTN (patent renal arteries by [facility] angio 08/2015), uncontrolled DM2, hyperlipidemia, CKD stage III by labs, diabetic neuropathy, PVD (status post left SFA and popliteal stent and left SFA PTA with drug coated balloon in 08/2015), left foot transmetatarsal amputation, tobacco abuse, obesity admitted with progressive DOE, two pillow orthopnea, PND, mild LEE, diaphoresis but no chest pain - out of medications for 3 days. Found to have acute hypoxic respiratory failure with O2 sats 70s requiring bipap. Prior notes indicate poor insight to disease, poor dietary compliance. Felt to have new onset CHF, also with troponin of 2.  Assessment & Plan    1. New acute diastolic CHF/suspected hypertensive heart disease- EF 55-60% G2DDshe is now at her stated baseline weight and volume much improved. Received 40mg  IV Lasix this AM. Further doses in anticipation of  cath. Pt  reports chronic edema for which she uses compression hose -  --neg 1937 since admit and wt down from 203 to 197 lbs- no SOB this AM  2. NSTEMI - continue aspirin, heparin, beta blocker, statin. She was on Plavix prior to admission due to PAD.  -on amlodipine,coreg, plavix, imdur 60 no chest pain, if reoccurs add ranexa  3.  CAD with 100% prox CX to mid CX stenosis, RCA with 80% stenosis other with nonobstructive as well.  PCI would require multiple stents and significant contrast, recommend to treat medically   4. AKI on CKD stage III - Cr improvingwith diuresis. UA concerning for nephrotic process. Will order 24-hour urine protein eval. Consider nephrology evaluation - denies prior kidney eval. --Cr 1.67 today   5. Essential HTN - ?driven by nephrotic process - if so, may benefit from low dose ACEI after cath has been performed. Continue amlodipine, carvedilol, higher dose of Imdur. Will titrate hydralazine to 50mg  TID. --BP today 161/65 for cath today   6. Hypokalemia- resolved K+ 3.7  7. Anemia, normocytic- suspect due to CKD. Hemoccult neg. hgb at 10.4 today   8.  Significant lightheadedness today with being up  Checking orthostatic BP.  ? Concern for CVA post Cath if not orthostatic.  May need neuro   For questions or updates, please contact [facility] Please consult [email or web address] for contact info under Cardiology/STEMI.     Signed, Ena Dawley, MD  09/18/2016, 2:54 PM    The patient was seen, examined and discussed with Cecilie Kicks, NP and I agree with the above.   Cath yesterday with  severe diffuse disease of the RCA, Ramus and circumflex.  LAD with only mild to moderate diffuse disease.  PCI would require multiple stents and significant contrast usage.  For now, would treat medically.  The patient has hypertensive heart disease with CHF and was admitted in hypertensive emergency. BP now better controlled but low for the patient causing headache and  dizziness. I would discharge on the current medical regimen. We will arrange for an early follow up and re-adjustment of the meds.  Ena Dawley, MD 09/18/2016

## 2016-09-23 ENCOUNTER — Encounter: Payer: Self-pay | Admitting: Family Medicine

## 2016-09-23 ENCOUNTER — Ambulatory Visit: Payer: Medicaid Other | Attending: Family Medicine | Admitting: Family Medicine

## 2016-09-23 VITALS — BP 190/77 | HR 82 | Temp 97.4°F | Ht 64.0 in | Wt 196.8 lb

## 2016-09-23 DIAGNOSIS — I10 Essential (primary) hypertension: Secondary | ICD-10-CM

## 2016-09-23 DIAGNOSIS — I739 Peripheral vascular disease, unspecified: Secondary | ICD-10-CM

## 2016-09-23 DIAGNOSIS — E1122 Type 2 diabetes mellitus with diabetic chronic kidney disease: Secondary | ICD-10-CM | POA: Insufficient documentation

## 2016-09-23 DIAGNOSIS — I5031 Acute diastolic (congestive) heart failure: Secondary | ICD-10-CM | POA: Diagnosis not present

## 2016-09-23 DIAGNOSIS — I2510 Atherosclerotic heart disease of native coronary artery without angina pectoris: Secondary | ICD-10-CM | POA: Diagnosis not present

## 2016-09-23 DIAGNOSIS — N189 Chronic kidney disease, unspecified: Secondary | ICD-10-CM

## 2016-09-23 DIAGNOSIS — Z794 Long term (current) use of insulin: Secondary | ICD-10-CM | POA: Diagnosis not present

## 2016-09-23 DIAGNOSIS — E1151 Type 2 diabetes mellitus with diabetic peripheral angiopathy without gangrene: Secondary | ICD-10-CM | POA: Insufficient documentation

## 2016-09-23 DIAGNOSIS — N183 Chronic kidney disease, stage 3 unspecified: Secondary | ICD-10-CM

## 2016-09-23 DIAGNOSIS — I130 Hypertensive heart and chronic kidney disease with heart failure and stage 1 through stage 4 chronic kidney disease, or unspecified chronic kidney disease: Secondary | ICD-10-CM | POA: Diagnosis not present

## 2016-09-23 DIAGNOSIS — K5909 Other constipation: Secondary | ICD-10-CM

## 2016-09-23 DIAGNOSIS — E1100 Type 2 diabetes mellitus with hyperosmolarity without nonketotic hyperglycemic-hyperosmolar coma (NKHHC): Secondary | ICD-10-CM

## 2016-09-23 HISTORY — DX: Chronic kidney disease, unspecified: N18.9

## 2016-09-23 LAB — GLUCOSE, POCT (MANUAL RESULT ENTRY): POC GLUCOSE: 152 mg/dL — AB (ref 70–99)

## 2016-09-23 MED ORDER — DOCUSATE SODIUM 100 MG PO CAPS: ORAL_CAPSULE | ORAL | 0 refills | Status: DC

## 2016-09-23 MED ORDER — CARVEDILOL 12.5 MG PO TABS: 12.5000 mg | ORAL_TABLET | Freq: Two times a day (BID) | ORAL | 3 refills | Status: DC

## 2016-09-23 NOTE — Patient Instructions (Signed)

## 2016-09-23 NOTE — Progress Notes (Signed)
Subjective:  Patient ID: Rebekah Johnson, female    DOB: 1960/01/24  Age: 56 y.o. MRN: [id number]  CC: Hospitalization Follow-up   HPI Rebekah Johnson  is a 56 year old female with a history of type 2 diabetes mellitus (A1c 9.4), diabetic neuropathy, peripheral vascular disease (status post revascularization in 08/2015), Left foot transmetatarsal amputation,  tobacco abuse, hypertension who presents today for a follow-up from hospitalization from 09/14/16 through 09/18/16 for acute diastolic exacerbation.  She had presented with malignant hypertension after running out of her medications, was found to be hypoxic in the 70s with elevated troponins. Chest x-ray revealed multifocal patchy opacities possibly reflecting mild to moderate interstitial edema, multifocal pneumonia not excluded. LHC revealed severe two-vessel CAD of the RCA, ramus and circumflex with mild to moderate diffuse disease of LAD. Medical management recommended for now as PCI would require multiple stents and contrast 2-D echo revealed EF of 51-76%, normal systolic function, akinesis of basal and mid inferolateral myocardium consistent with her to diastolic dysfunction. She was diuresed With resulting trending up of her creatinine from a baseline of 1.3 to about 1.7; her antihypertensive regimen was adjusted with resulting improvement in her symptoms after which she was subsequently discharged.  She presents today with no acute concerns but her blood pressure is significantly elevated at 190/77 despite her compliance with her antihypertensives. She denies chest pains, shortness of breath.   Past Medical History:  Diagnosis Date  . Diabetes mellitus without complication ([place])   . High cholesterol   . Hypertension     Past Surgical History:  Procedure Laterality Date  . ABDOMINAL HYSTERECTOMY    . AMPUTATION Left 09/06/2015   Procedure: LEFT TRANSMETATARSAL AMPUTATTION;  Surgeon: Newt Minion, MD;  Location: [facility];  Service:  Orthopedics;  Laterality: Left;  . LEFT HEART CATH AND CORONARY ANGIOGRAPHY N/A 09/17/2016   Procedure: LEFT HEART CATH AND CORONARY ANGIOGRAPHY;  Surgeon: Jettie Booze, MD;  Location: [facility];  Service: Cardiovascular;  Laterality: N/A;  . PERIPHERAL VASCULAR CATHETERIZATION N/A 08/16/2015   Procedure: Abdominal Aortogram;  Surgeon: Elam Dutch, MD;  Location: [facility];  Service: Cardiovascular;  Laterality: N/A;  . PERIPHERAL VASCULAR CATHETERIZATION Bilateral 08/16/2015   Procedure: Lower Extremity Angiography;  Surgeon: Elam Dutch, MD;  Location: [facility];  Service: Cardiovascular;  Laterality: Bilateral;  . PERIPHERAL VASCULAR CATHETERIZATION Left 08/16/2015   Procedure: Peripheral Vascular Intervention;  Surgeon: Elam Dutch, MD;  Location: [facility];  Service: Cardiovascular;  Laterality: Left;  SFA STENT X 2    No Known Allergies   Outpatient Medications Prior to Visit  Medication Sig Dispense Refill  . amLODipine (NORVASC) 10 MG tablet Take 1 tablet (10 mg total) by mouth daily. Reported on 07/13/2015 30 tablet 3  . aspirin 81 MG EC tablet Take 1 tablet (81 mg total) by mouth daily. Reported on 07/13/2015 90 tablet 0  . atorvastatin (LIPITOR) 80 MG tablet Take 1 tablet (80 mg total) by mouth daily. 30 tablet 0  . blood glucose meter kit and supplies KIT Dispense based on patient and insurance preference. Use up to four times daily as directed. (FOR ICD-9 250.00, 250.01). 1 each 3  . Blood Glucose Monitoring Suppl (ACCU-CHEK AVIVA) device Use as instructed three times daily. 1 each 0  . Cholecalciferol (VITAMIN D-3) 1000 units CAPS Take 1,000 Units by mouth daily.     . clopidogrel (PLAVIX) 75 MG tablet Take 1 tablet (75 mg total)  by mouth daily. 30 tablet 3  . gabapentin (NEURONTIN) 300 MG capsule Take 1 capsule (300 mg total) by mouth 3 (three) times daily.    [name] glipiZIDE (GLUCOTROL) 10 MG tablet Take 1 tablet (10 mg total) by  mouth 2 (two) times daily before a meal. 60 tablet 3  . glucose blood (ACCU-CHEK AVIVA) test strip Use as instructed three times daily before meals. 100 each 12  . hydrALAZINE (APRESOLINE) 50 MG tablet Take 1 tablet (50 mg total) by mouth 3 (three) times daily. 90 tablet 0  . isosorbide mononitrate (IMDUR) 60 MG 24 hr tablet Take 1 tablet (60 mg total) by mouth daily. 30 tablet 3  . Lancet Devices (ACCU-CHEK SOFTCLIX) lancets Use as instructed three times daily before meals. 1 each 5  . Omega-3 Fatty Acids (FISH OIL) 1000 MG CAPS Take 1,000 mg by mouth daily. Reported on 07/13/2015    . sitaGLIPtin (JANUVIA) 50 MG tablet Take 1 tablet (50 mg total) by mouth daily. 30 tablet 3  . carvedilol (COREG) 6.25 MG tablet Take 1 tablet (6.25 mg total) by mouth 2 (two) times daily. 60 tablet 11  . docusate sodium (COLACE) 100 MG capsule TAKE 1 CAPSULE BY MOUTH TWICE A DAY (Patient taking differently: TAKE 1 CAPSULE (100 MG) BY MOUTH TWICE A DAY) 60 capsule 0   No facility-administered medications prior to visit.     ROS Review of Systems  Constitutional: Negative for activity change, appetite change and fatigue.  HENT: Negative for congestion, sinus pressure and sore throat.   Eyes: Negative for visual disturbance.  Respiratory: Negative for cough, chest tightness, shortness of breath and wheezing.   Cardiovascular: Negative for chest pain and palpitations.  Gastrointestinal: Negative for abdominal distention, abdominal pain and constipation.  Endocrine: Negative for polydipsia.  Genitourinary: Negative for dysuria and frequency.  Musculoskeletal: Negative for arthralgias and back pain.  Skin: Negative for rash.  Neurological: Negative for tremors, light-headedness and numbness.  Hematological: Does not bruise/bleed easily.  Psychiatric/Behavioral: Negative for agitation and behavioral problems.    Objective:  BP (!) 190/77   Pulse 82   Temp (!) 97.4 F (36.3 C) (Oral)   Ht '5\' 4"'  (1.626 m)    Wt 196 lb 12.8 oz (89.3 kg)   SpO2 98%   BMI 33.78 kg/m   BP/Weight 09/23/2016 09/18/2016 08/14/1749  Systolic BP 025 852 778  Diastolic BP 77 55 77  Wt. (Lbs) 196.8 198.2 204.8  BMI 33.78 34.02 35.15      Physical Exam  Constitutional: She is oriented to person, place, and time. She appears well-developed and well-nourished.  Cardiovascular: Normal rate and normal heart sounds.   No murmur heard. Unable to palpate dorsalis pedis bilaterally  Pulmonary/Chest: Effort normal and breath sounds normal. She has no wheezes. She has no rales. She exhibits no tenderness.  Abdominal: Soft. Bowel sounds are normal. She exhibits no distension and no mass. There is no tenderness.  Musculoskeletal: Normal range of motion.  Transmetatarsal amputation left foot  Neurological: She is alert and oriented to person, place, and time.  Skin: Skin is warm and dry.  Psychiatric: She has a normal mood and affect.    Lab Results  Component Value Date   HGBA1C 8.8 (H) 09/14/2016    CMP Latest Ref Rng & Units 09/18/2016 09/17/2016 09/16/2016  Glucose 65 - 99 mg/dL 137(H) 160(H) 187(H)  BUN 6 - 20 mg/dL '10 11 10  ' Creatinine 0.44 - 1.00 mg/dL 1.67(H) 1.73(H) 1.67(H)  Sodium 135 -  145 mmol/L 136 137 137  Potassium 3.5 - 5.1 mmol/L 3.7 4.0 4.0  Chloride 101 - 111 mmol/L 106 103 106  CO2 22 - 32 mmol/L '23 27 28  ' Calcium 8.9 - 10.3 mg/dL 8.5(L) 8.5(L) 8.2(L)  Total Protein 6.5 - 8.1 g/dL - - 5.8(L)  Total Bilirubin 0.3 - 1.2 mg/dL - - 0.5  Alkaline Phos 38 - 126 U/L - - 79  AST 15 - 41 U/L - - 16  ALT 14 - 54 U/L - - 14     Assessment & Plan:   1. Type 2 diabetes mellitus with hyperosmolarity without coma, without long-term current use of insulin (HCC) Uncontrolled with A1c of 8.8 Continues to resist initiation of insulin Currently taking glipizide and Januvia Diabetic diet, lifestyle modifications We have discussed end organ affectation from prolonged uncontrolled diabetes mellitus - POCT glucose  (manual entry)  2. Acute diastolic CHF (congestive heart failure) (HCC) EF 69-62%, grade 2 diastolic dysfunction Euvolemic at this time Continue medications  3. Peripheral vascular disease ([place]) Status post left SFA and popliteal stent Risk factor modification  4. 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 Malignant hypertension Uncontrolled despite compliance with antihypertensive Increased dose of Coreg Low-sodium diet, DASH diet, lifestyle modifications. We'll reassess blood pressure at next visit. - carvedilol (COREG) 12.5 MG tablet; Take 1 tablet (12.5 mg total) by mouth 2 (two) times daily.  Dispense: 60 tablet; Refill: 3  6. Other constipation Refill Colace  7. CAD Severe 2 vessel CAD Medical management for now Risk factor modification Keep appointment with cardiology   Meds ordered this encounter  Medications  . carvedilol (COREG) 12.5 MG tablet    Sig: Take 1 tablet (12.5 mg total) by mouth 2 (two) times daily.    Dispense:  60 tablet    Refill:  3    Discontinue previous dose  . docusate sodium (COLACE) 100 MG capsule    Sig: TAKE 1 CAPSULE (100 MG) BY MOUTH TWICE A DAY    Dispense:  60 capsule    Refill:  0    Follow-up: Return in about 1 month (around 10/23/2016) for follow-up on hypertension.   Arnoldo Morale MD

## 2016-09-24 ENCOUNTER — Telehealth: Payer: Self-pay | Admitting: Family Medicine

## 2016-09-24 NOTE — Telephone Encounter (Signed)
Rebekah Johnson could you please help with this patient's ophthalmology referral? She does have Medicaid. Thank you.

## 2016-10-03 ENCOUNTER — Encounter: Payer: Self-pay | Admitting: Physician Assistant

## 2016-10-03 NOTE — Progress Notes (Deleted)
Cardiology Office Note    Date:  10/03/2016  ID:  Rebekah Johnson, DOB 05/06/1960, MRN [id number] PCP:  Arnoldo Morale, MD  Cardiologist:  Dr. [name]   Chief Complaint: f/u CHF  History of Present Illness:  Rebekah Johnson is a 56 y.o. female with history of HTN (patent renal arteries by PV angio 08/2015), uncontrolled DM2, hyperlipidemia, CKD stage III, diabetic neuropathy, PVD (status post left SFA and popliteal stent and left SFA PTA with drug coated balloon in 08/2015), left foot transmetatarsal amputation, tobacco abuse, obesity, and recently diagnosed acute diastolic CHF/hypertensive heart disease, diffuse CAD (med rx), normocytic anemia (hemoccult neg 09/2016) who presents for f/u. She was admitted 09/2016 with progressive dyspnea, hypoxia in the 70s and high BP in setting of being out of her meds for 3 days. Troponin bumped to 2. Cardiac cath 09/17/16 showed severe diffuse disease of the RCA, ramus and Cx with mild-mod disease of LAD; PCI would require multiple stents and significant contrast usage thus medical therapy. 2D echo 9/111/18 showed mild LVH, EF 55-60%, akinesis of the basal-midinferolateral myocardium, grade 2 DD, mild MR, mild LAE. She was diuresed down to 197lb. UA was concerning for nephrotic process and 24-hour urine protein showed 5.6g of protein. Statin titrated to atorvastatin 80mg . Last labs showed Hgb 10.8, Cr 1.67, K 3.7, LDL 35, A1C 8.8, TSH wnl, albumin 3.2, AP 131 otherwise normal AST/ALT.  Chronic diastolic CHF/hypertensive heart disease CAD HTN CKD stage III with possible nephrotic syndrome   refer to nephrology for nephrotic     Past Medical History:  Diagnosis Date  . CAD (coronary artery disease)    a. NSTEM 09/2016: cath showing severe diffuse disease of the RCA, ramus and Cx with mild-mod disease of LAD; PCI would require multiple stents and significant contrast usage thus medical therapy recommended.  . Chronic diastolic CHF (congestive heart failure)  ([place])   . CKD (chronic kidney disease), stage III   . Diabetes mellitus with neuropathy ([place])   . Foot amputation status ([place])    a. h/o left foot transmetatarsal amputation  . High cholesterol   . Hypertension    a. patent renal arteries by PV angio 08/2015. b. heavy proteinuria 09/2016 ? nephrotic.  [name] Normocytic anemia   . Obesity   . Proteinuria   . PVD (peripheral vascular disease) ([place])    a. status post left SFA and popliteal stent and left SFA PTA with drug coated balloon in 08/2015.  . Tobacco abuse     Past Surgical History:  Procedure Laterality Date  . ABDOMINAL HYSTERECTOMY    . AMPUTATION Left 09/06/2015   Procedure: LEFT TRANSMETATARSAL AMPUTATTION;  Surgeon: Newt Minion, MD;  Location: [facility];  Service: Orthopedics;  Laterality: Left;  . LEFT HEART CATH AND CORONARY ANGIOGRAPHY N/A 09/17/2016   Procedure: LEFT HEART CATH AND CORONARY ANGIOGRAPHY;  Surgeon: Jettie Booze, MD;  Location: [facility];  Service: Cardiovascular;  Laterality: N/A;  . PERIPHERAL VASCULAR CATHETERIZATION N/A 08/16/2015   Procedure: Abdominal Aortogram;  Surgeon: Elam Dutch, MD;  Location: [facility];  Service: Cardiovascular;  Laterality: N/A;  . PERIPHERAL VASCULAR CATHETERIZATION Bilateral 08/16/2015   Procedure: Lower Extremity Angiography;  Surgeon: Elam Dutch, MD;  Location: [facility];  Service: Cardiovascular;  Laterality: Bilateral;  . PERIPHERAL VASCULAR CATHETERIZATION Left 08/16/2015   Procedure: Peripheral Vascular Intervention;  Surgeon: Elam Dutch, MD;  Location: [facility];  Service: Cardiovascular;  Laterality: Left;  SFA  STENT X 2    Current Medications: No outpatient prescriptions have been marked as taking for the 10/05/16 encounter (Appointment) with Charlie Pitter, PA-C.     Allergies:   Patient has no known allergies.   Social History   Social History  . Marital status: Married    Spouse name: N/A  . Number of  children: N/A  . Years of education: N/A   Social History Main Topics  . Smoking status: Current Every Day Smoker    Packs/day: 0.25    Types: Cigarettes  . Smokeless tobacco: Never Used     Comment: 1 pk last 1 1/2 days  . Alcohol use 0.6 - 1.2 oz/week    1 - 2 Glasses of wine per week     Comment: on social occassions  . Drug use: No  . Sexual activity: Yes    Partners: Male   Other Topics Concern  . Not on file   Social History Narrative  . No narrative on file     Family History:  Family History  Problem Relation Age of Onset  . Diabetes Mother   . Hypertension Father    ***  ROS:   Please see the history of present illness. Otherwise, review of systems is positive for ***.  All other systems are reviewed and otherwise negative.    PHYSICAL EXAM:   VS:  There were no vitals taken for this visit.  BMI: There is no height or weight on file to calculate BMI. GEN: Well nourished, well developed, in no acute distress  HEENT: normocephalic, atraumatic Neck: no JVD, carotid bruits, or masses Cardiac: ***RRR; no murmurs, rubs, or gallops, no edema  Respiratory:  clear to auscultation bilaterally, normal work of breathing GI: soft, nontender, nondistended, + BS MS: no deformity or atrophy  Skin: warm and dry, no rash Neuro:  Alert and Oriented x 3, Strength and sensation are intact, follows commands Psych: euthymic mood, full affect  Wt Readings from Last 3 Encounters:  09/23/16 196 lb 12.8 oz (89.3 kg)  09/18/16 198 lb 3.2 oz (89.9 kg)  09/02/16 204 lb 12.8 oz (92.9 kg)      Studies/Labs Reviewed:   EKG:  EKG was ordered today and personally reviewed by me and demonstrates *** EKG was not ordered today.***  Recent Labs: 09/14/2016: B Natriuretic Peptide 549.0; TSH 0.959 09/16/2016: ALT 14; Magnesium 1.7 09/18/2016: BUN 10; Creatinine, Ser 1.67; Hemoglobin 10.8; Platelets 201; Potassium 3.7; Sodium 136   Lipid Panel    Component Value Date/Time   CHOL 93  09/16/2016 0559   TRIG 163 (H) 09/16/2016 0559   HDL 25 (L) 09/16/2016 0559   CHOLHDL 3.7 09/16/2016 0559   VLDL 33 09/16/2016 0559   LDLCALC 35 09/16/2016 0559    Additional studies/ records that were reviewed today include: Summarized above.***    ASSESSMENT & PLAN:   1. ***  Disposition: F/u with ***   Medication Adjustments/Labs and Tests Ordered: Current medicines are reviewed at length with the patient today.  Concerns regarding medicines are outlined above. Medication changes, Labs and Tests ordered today are summarized above and listed in the Patient Instructions accessible in Encounters.   Signed, Charlie Pitter, PA-C  10/03/2016 9:59 AM    [facility] [address], [place], [place]  [address] Phone: [phone]; Fax: [phone]

## 2016-10-05 ENCOUNTER — Ambulatory Visit: Payer: Medicaid Other | Admitting: Physician Assistant

## 2016-10-23 ENCOUNTER — Other Ambulatory Visit: Payer: Self-pay | Admitting: Family Medicine

## 2016-10-23 NOTE — Progress Notes (Signed)
Cardiology Office Note    Date:  10/26/2016   ID:  Gay Moncivais, DOB 05-06-1960, MRN [id number]  PCP:  Arnoldo Morale, MD  Cardiologist:  Dr. [name]   Chief Complaint: Hospital follow up CHF  History of Present Illness:   Rebekah Johnson is a 56 y.o. female  with HTN (patent renal arteries by PV angio 08/2015), uncontrolled DM2, hyperlipidemia, CKD stage III by labs, diabetic neuropathy, PVD (status post left SFA and popliteal stent and left SFA PTA with drug coated balloon in 08/2015), left foot transmetatarsal amputation, tobacco abuse, obesity presents for hospital follow up.   Admitted 09/2016 with progressive DOE, two pillow orthopnea, PND, mild LEE, diaphoresis but no chest pain - out of medications for 3 days. Found to have acute hypoxic respiratory failure with O2 sats 70s requiring bipap.  Felt to have new onset CHF, also with troponin of 2. EF 55-60% G2DD. Cath showed 100% prox CX to mid CX stenosis, RCA with 80% stenosis other with nonobstructive as well.  PCI would require multiple stents and significant contrast, recommendws to treat medically. UA concerning for nephrotic process. Outpatient referral to nephrology.   Here for follow-up. She has cut back on tobacco smoking, now smokes 3 cigarettes a day. She continues to have dyspnea on exertion without chest pain. This has been improved significantly. Compliant with medication and low sodium diet. Historical blood pressure runs in 160s range. Past Medical History:  Diagnosis Date  . CAD (coronary artery disease)    a. NSTEM 09/2016: cath showing severe diffuse disease of the RCA, ramus and Cx with mild-mod disease of LAD; PCI would require multiple stents and significant contrast usage thus medical therapy recommended.  . Chronic diastolic CHF (congestive heart failure) ([place])   . CKD (chronic kidney disease), stage III ([place])   . Diabetes mellitus with neuropathy ([place])   . Foot amputation status ([place])    a. h/o left foot  transmetatarsal amputation  . High cholesterol   . Hypertension    a. patent renal arteries by PV angio 08/2015. b. heavy proteinuria 09/2016 ? nephrotic.  [name] Normocytic anemia   . Obesity   . Proteinuria   . PVD (peripheral vascular disease) ([place])    a. status post left SFA and popliteal stent and left SFA PTA with drug coated balloon in 08/2015.  . Tobacco abuse     Past Surgical History:  Procedure Laterality Date  . ABDOMINAL HYSTERECTOMY    . AMPUTATION Left 09/06/2015   Procedure: LEFT TRANSMETATARSAL AMPUTATTION;  Surgeon: Newt Minion, MD;  Location: [facility];  Service: Orthopedics;  Laterality: Left;  . LEFT HEART CATH AND CORONARY ANGIOGRAPHY N/A 09/17/2016   Procedure: LEFT HEART CATH AND CORONARY ANGIOGRAPHY;  Surgeon: Jettie Booze, MD;  Location: [facility];  Service: Cardiovascular;  Laterality: N/A;  . PERIPHERAL VASCULAR CATHETERIZATION N/A 08/16/2015   Procedure: Abdominal Aortogram;  Surgeon: Elam Dutch, MD;  Location: [facility];  Service: Cardiovascular;  Laterality: N/A;  . PERIPHERAL VASCULAR CATHETERIZATION Bilateral 08/16/2015   Procedure: Lower Extremity Angiography;  Surgeon: Elam Dutch, MD;  Location: [facility];  Service: Cardiovascular;  Laterality: Bilateral;  . PERIPHERAL VASCULAR CATHETERIZATION Left 08/16/2015   Procedure: Peripheral Vascular Intervention;  Surgeon: Elam Dutch, MD;  Location: [facility];  Service: Cardiovascular;  Laterality: Left;  SFA STENT X 2    Current Medications: Prior to Admission medications   Medication Sig Start Date End Date Taking?  Authorizing Provider  amLODipine (NORVASC) 10 MG tablet Take 1 tablet (10 mg total) by mouth daily. Reported on 07/13/2015 09/02/16 09/02/17  Arnoldo Morale, MD  aspirin 81 MG EC tablet Take 1 tablet (81 mg total) by mouth daily. Reported on 07/13/2015 07/02/16   Arnoldo Morale, MD  atorvastatin (LIPITOR) 80 MG tablet Take 1 tablet (80 mg total) by mouth  daily. 09/19/16   Cherene Altes, MD  blood glucose meter kit and supplies KIT Dispense based on patient and insurance preference. Use up to four times daily as directed. (FOR ICD-9 250.00, 250.01). 07/25/15   Ward, Delice Bison, DO  Blood Glucose Monitoring Suppl (ACCU-CHEK AVIVA) device Use as instructed three times daily. 04/22/16   Arnoldo Morale, MD  carvedilol (COREG) 12.5 MG tablet Take 1 tablet (12.5 mg total) by mouth 2 (two) times daily. 09/23/16 09/23/17  Arnoldo Morale, MD  Cholecalciferol (VITAMIN D-3) 1000 units CAPS Take 1,000 Units by mouth daily.     [provider]  clopidogrel (PLAVIX) 75 MG tablet Take 1 tablet (75 mg total) by mouth daily. 09/02/16   Arnoldo Morale, MD  docusate sodium (COLACE) 100 MG capsule TAKE 1 CAPSULE (100 MG) BY MOUTH TWICE A DAY 09/23/16   Arnoldo Morale, MD  gabapentin (NEURONTIN) 300 MG capsule Take 1 capsule (300 mg total) by mouth 3 (three) times daily. 09/18/16   Cherene Altes, MD  glipiZIDE (GLUCOTROL) 10 MG tablet Take 1 tablet (10 mg total) by mouth 2 (two) times daily before a meal. 09/02/16   Arnoldo Morale, MD  glucose blood (ACCU-CHEK AVIVA) test strip Use as instructed three times daily before meals. 04/22/16   Arnoldo Morale, MD  hydrALAZINE (APRESOLINE) 50 MG tablet Take 1 tablet (50 mg total) by mouth 3 (three) times daily. 09/18/16   Cherene Altes, MD  isosorbide mononitrate (IMDUR) 60 MG 24 hr tablet Take 1 tablet (60 mg total) by mouth daily. 09/02/16   Arnoldo Morale, MD  Lancet [employer] [facility]) lancets Use as instructed three times daily before meals. 04/22/16   Arnoldo Morale, MD  Omega-3 Fatty Acids (FISH OIL) 1000 MG CAPS Take 1,000 mg by mouth daily. Reported on 07/13/2015    [provider]  sitaGLIPtin (JANUVIA) 50 MG tablet Take 1 tablet (50 mg total) by mouth daily. 09/02/16   Arnoldo Morale, MD    Allergies:   Patient has no known allergies.   Social History   Social History  . Marital status: Married     Spouse name: N/A  . Number of children: N/A  . Years of education: N/A   Social History Main Topics  . Smoking status: Current Every Day Smoker    Packs/day: 0.25    Types: Cigarettes  . Smokeless tobacco: Never Used     Comment: 1 pk last 1 1/2 days  . Alcohol use 0.6 - 1.2 oz/week    1 - 2 Glasses of wine per week     Comment: on social occassions  . Drug use: No  . Sexual activity: Yes    Partners: Male   Other Topics Concern  . None   Social History Narrative  . None     Family History:  The patient's family history includes Diabetes in her mother; Hypertension in her father.   ROS:   Please see the history of present illness.    ROS All other systems reviewed and are negative.   PHYSICAL EXAM:   VS:  BP (!) 178/64  Pulse 88   Resp 16   Ht _0  (1.626 m)   Wt 201 lb (91.2 kg)   SpO2 98%   BMI 34.50 kg/m    GEN: Well nourished, well developed, in no acute distress  HEENT: normal  Neck: no JVD, carotid bruits, or masses Cardiac: RRR; no murmurs, rubs, or gallops,no edema  Respiratory:  clear to auscultation bilaterally, normal work of breathing GI: soft, nontender, nondistended, + BS MS: no deformity or atrophy  Skin: warm and dry, no rash Neuro:  Alert and Oriented x 3, Strength and sensation are intact Psych: euthymic mood, full affect  Wt Readings from Last 3 Encounters:  10/26/16 201 lb (91.2 kg)  09/23/16 196 lb 12.8 oz (89.3 kg)  09/18/16 198 lb 3.2 oz (89.9 kg)      Studies/Labs Reviewed:   EKG:  EKG is not ordered today.    Recent Labs: 09/14/2016: B Natriuretic Peptide 549.0; TSH 0.959 09/16/2016: ALT 14; Magnesium 1.7 09/18/2016: BUN 10; Creatinine, Ser 1.67; Hemoglobin 10.8; Platelets 201; Potassium 3.7; Sodium 136   Lipid Panel    Component Value Date/Time   CHOL 93 09/16/2016 0559   TRIG 163 (H) 09/16/2016 0559   HDL 25 (L) 09/16/2016 0559   CHOLHDL 3.7 09/16/2016 0559   VLDL 33 09/16/2016 0559   LDLCALC 35 09/16/2016 0559     Additional studies/ records that were reviewed today include:   LEFT HEART CATH AND CORONARY ANGIOGRAPHY  Conclusion     Ost 1st Diag lesion, 25 %stenosed.  Ost 2nd Diag lesion, 25 %stenosed.  Ramus-1 lesion, 50 %stenosed.  Ramus-2 lesion, 80 %stenosed.  Prox Cx to Mid Cx lesion, 100 %stenosed.  Prox RCA to Mid RCA lesion, 80 %stenosed.  RPDA lesion, 80 %stenosed.  Dist RCA lesion, 80 %stenosed.  Mid RCA to Dist RCA lesion, 80 %stenosed.  LV end diastolic pressure is mildly elevated.  There is no aortic valve stenosis.  Severe diffuse disease of the RCA, Ramus and circumflex. LAD with only mild to moderate diffuse disease. PCI would require multiple stents and significant contrast usage. For now, would treat medically.     Echo 09/15/16 Study Conclusions  - Left ventricle: The cavity size was normal. Wall thickness was increased in a pattern of mild LVH. Systolic function was normal. The estimated ejection fraction was in the range of 55% to 60%. There is akinesis of the basal-midinferolateral myocardium. Features are consistent with a pseudonormal left ventricular filling pattern, with concomitant abnormal relaxation and increased filling pressure (grade 2 diastolic dysfunction). Doppler parameters are consistent with high ventricular filling pressure. - Mitral valve: There was mild regurgitation. - Left atrium: The atrium was mildly dilated.  Impressions:  - Akinesis of the basal/mid inferolateral wall with overall preserved LV systolic function; moderate diastolic dysfunction; mild LVH; mild MR; mild LAE.    ASSESSMENT & PLAN:    1. CAD  - Cath as discussed above. Plan for medical therapy. Her dyspnea on exertion is improving. Continue aspirin, Plavix, beta blocker, Imdur and statin. May consider PCI once evaluated by nephrologist or worsening of symptoms.   2. Chronic diastolic CHF -Euvolemic.   3. Hypertensive  heart disease - elevated. Increase Hydralazine to 30m BID. Continue Coreg 12.5 mg twice a day, Imdur 60 mg daily and Norvasc 10 mg daily.  4. PVD - on Plavix  5. CKD stage III - pending evaluation by nephrologist. Workup per PCP  6. Tobacco smoking - She has cut back. Advised complete cessation.  Medication Adjustments/Labs and Tests Ordered: Current medicines are reviewed at length with the patient today.  Concerns regarding medicines are outlined above.  Medication changes, Labs and Tests ordered today are listed in the Patient Instructions below. Patient Instructions  Medication Instructions:  INCREASE Hydralazine to 75 mg Three time a day   Labwork: None   Testing/Procedures: None   Follow-Up: Your physician recommends that you schedule a follow-up appointment in: 2 months with Dr [name]  Any Other Special Instructions Will Be Listed Below (If Applicable).  If you need a refill on your cardiac medications before your next appointment, please call your pharmacy.      Jarrett Soho, [place]  10/26/2016 4:04 PM    [facility] [place], [place], [place]  [address] Phone: [phone]; Fax: [phone]

## 2016-10-26 ENCOUNTER — Encounter: Payer: Self-pay | Admitting: Physician Assistant

## 2016-10-26 ENCOUNTER — Ambulatory Visit (INDEPENDENT_AMBULATORY_CARE_PROVIDER_SITE_OTHER): Payer: Self-pay | Admitting: Physician Assistant

## 2016-10-26 VITALS — BP 178/64 | HR 88 | Resp 16 | Ht 64.0 in | Wt 201.0 lb

## 2016-10-26 DIAGNOSIS — N183 Chronic kidney disease, stage 3 unspecified: Secondary | ICD-10-CM

## 2016-10-26 DIAGNOSIS — I2510 Atherosclerotic heart disease of native coronary artery without angina pectoris: Secondary | ICD-10-CM

## 2016-10-26 DIAGNOSIS — Z720 Tobacco use: Secondary | ICD-10-CM

## 2016-10-26 DIAGNOSIS — I5032 Chronic diastolic (congestive) heart failure: Secondary | ICD-10-CM

## 2016-10-26 DIAGNOSIS — I739 Peripheral vascular disease, unspecified: Secondary | ICD-10-CM

## 2016-10-26 DIAGNOSIS — I130 Hypertensive heart and chronic kidney disease with heart failure and stage 1 through stage 4 chronic kidney disease, or unspecified chronic kidney disease: Secondary | ICD-10-CM

## 2016-10-26 MED ORDER — HYDRALAZINE HCL 50 MG PO TABS: 75.0000 mg | ORAL_TABLET | Freq: Three times a day (TID) | ORAL | 4 refills | Status: DC

## 2016-10-26 MED ORDER — NITROGLYCERIN 0.4 MG SL SUBL: 0.4000 mg | SUBLINGUAL_TABLET | SUBLINGUAL | 3 refills | Status: DC | PRN

## 2016-10-26 NOTE — Patient Instructions (Signed)
Medication Instructions:  INCREASE Hydralazine to 75 mg Three time a day   Labwork: None   Testing/Procedures: None   Follow-Up: Your physician recommends that you schedule a follow-up appointment in: 2 months with Dr [name]  Any Other Special Instructions Will Be Listed Below (If Applicable).  If you need a refill on your cardiac medications before your next appointment, please call your pharmacy.

## 2016-10-27 ENCOUNTER — Ambulatory Visit: Payer: Self-pay | Attending: Family Medicine | Admitting: Family Medicine

## 2016-10-27 ENCOUNTER — Encounter: Payer: Self-pay | Admitting: Family Medicine

## 2016-10-27 VITALS — BP 198/79 | HR 75 | Temp 97.5°F | Ht 64.0 in | Wt 201.6 lb

## 2016-10-27 DIAGNOSIS — Z7984 Long term (current) use of oral hypoglycemic drugs: Secondary | ICD-10-CM | POA: Insufficient documentation

## 2016-10-27 DIAGNOSIS — Z7982 Long term (current) use of aspirin: Secondary | ICD-10-CM | POA: Insufficient documentation

## 2016-10-27 DIAGNOSIS — E7800 Pure hypercholesterolemia, unspecified: Secondary | ICD-10-CM | POA: Insufficient documentation

## 2016-10-27 DIAGNOSIS — E1151 Type 2 diabetes mellitus with diabetic peripheral angiopathy without gangrene: Secondary | ICD-10-CM | POA: Insufficient documentation

## 2016-10-27 DIAGNOSIS — Z79899 Other long term (current) drug therapy: Secondary | ICD-10-CM | POA: Insufficient documentation

## 2016-10-27 DIAGNOSIS — N183 Chronic kidney disease, stage 3 unspecified: Secondary | ICD-10-CM

## 2016-10-27 DIAGNOSIS — I129 Hypertensive chronic kidney disease with stage 1 through stage 4 chronic kidney disease, or unspecified chronic kidney disease: Secondary | ICD-10-CM | POA: Insufficient documentation

## 2016-10-27 DIAGNOSIS — E669 Obesity, unspecified: Secondary | ICD-10-CM | POA: Insufficient documentation

## 2016-10-27 DIAGNOSIS — E1165 Type 2 diabetes mellitus with hyperglycemia: Secondary | ICD-10-CM | POA: Insufficient documentation

## 2016-10-27 DIAGNOSIS — Z7902 Long term (current) use of antithrombotics/antiplatelets: Secondary | ICD-10-CM | POA: Insufficient documentation

## 2016-10-27 DIAGNOSIS — E1149 Type 2 diabetes mellitus with other diabetic neurological complication: Secondary | ICD-10-CM

## 2016-10-27 DIAGNOSIS — E1122 Type 2 diabetes mellitus with diabetic chronic kidney disease: Secondary | ICD-10-CM | POA: Insufficient documentation

## 2016-10-27 DIAGNOSIS — E1140 Type 2 diabetes mellitus with diabetic neuropathy, unspecified: Secondary | ICD-10-CM | POA: Insufficient documentation

## 2016-10-27 DIAGNOSIS — I130 Hypertensive heart and chronic kidney disease with heart failure and stage 1 through stage 4 chronic kidney disease, or unspecified chronic kidney disease: Secondary | ICD-10-CM | POA: Insufficient documentation

## 2016-10-27 DIAGNOSIS — E1100 Type 2 diabetes mellitus with hyperosmolarity without nonketotic hyperglycemic-hyperosmolar coma (NKHHC): Secondary | ICD-10-CM | POA: Insufficient documentation

## 2016-10-27 DIAGNOSIS — I2510 Atherosclerotic heart disease of native coronary artery without angina pectoris: Secondary | ICD-10-CM | POA: Insufficient documentation

## 2016-10-27 DIAGNOSIS — I739 Peripheral vascular disease, unspecified: Secondary | ICD-10-CM

## 2016-10-27 DIAGNOSIS — I10 Essential (primary) hypertension: Secondary | ICD-10-CM

## 2016-10-27 DIAGNOSIS — Z89439 Acquired absence of unspecified foot: Secondary | ICD-10-CM | POA: Insufficient documentation

## 2016-10-27 LAB — GLUCOSE, POCT (MANUAL RESULT ENTRY): POC Glucose: 231 mg/dl — AB (ref 70–99)

## 2016-10-27 MED ORDER — SITAGLIPTIN PHOSPHATE 50 MG PO TABS: 50.0000 mg | ORAL_TABLET | Freq: Every day | ORAL | 3 refills | Status: DC

## 2016-10-27 MED ORDER — CARVEDILOL 12.5 MG PO TABS: 12.5000 mg | ORAL_TABLET | Freq: Two times a day (BID) | ORAL | 3 refills | Status: DC

## 2016-10-27 MED ORDER — ISOSORBIDE MONONITRATE ER 60 MG PO TB24: 60.0000 mg | ORAL_TABLET | Freq: Every day | ORAL | 3 refills | Status: DC

## 2016-10-27 MED ORDER — ATORVASTATIN CALCIUM 80 MG PO TABS: 80.0000 mg | ORAL_TABLET | Freq: Every day | ORAL | 3 refills | Status: DC

## 2016-10-27 MED ORDER — CLOPIDOGREL BISULFATE 75 MG PO TABS: 75.0000 mg | ORAL_TABLET | Freq: Every day | ORAL | 3 refills | Status: DC

## 2016-10-27 MED ORDER — AMLODIPINE BESYLATE 10 MG PO TABS: 10.0000 mg | ORAL_TABLET | Freq: Every day | ORAL | 3 refills | Status: DC

## 2016-10-27 MED ORDER — HYDRALAZINE HCL 100 MG PO TABS: 100.0000 mg | ORAL_TABLET | Freq: Two times a day (BID) | ORAL | 3 refills | Status: DC

## 2016-10-27 MED ORDER — GLIPIZIDE 10 MG PO TABS: 10.0000 mg | ORAL_TABLET | Freq: Two times a day (BID) | ORAL | 3 refills | Status: DC

## 2016-10-27 MED ORDER — GABAPENTIN 300 MG PO CAPS: 300.0000 mg | ORAL_CAPSULE | Freq: Three times a day (TID) | ORAL | 3 refills | Status: DC

## 2016-10-27 MED FILL — hydrALAZINE HCL 100 MG TABS: 100 | 30 days supply | Qty: 60 | Fill #0

## 2016-10-27 MED FILL — ?CLOPIDOGREL 75MG TAB: 75 | 30 days supply | Qty: 30 | Fill #0

## 2016-10-27 MED FILL — ?GLIPIZIDE 10 MG TABLET: 10 | 30 days supply | Qty: 60 | Fill #0

## 2016-10-27 MED FILL — ISOSORBIDE MN ER 60 MG TAB: 60 | 30 days supply | Qty: 30 | Fill #0

## 2016-10-27 MED FILL — $JANUVIA 50 MG TABLET: 50 | 30 days supply | Qty: 30 | Fill #0

## 2016-10-27 MED FILL — ?CARVEDILOL 12.5 MG TABLET: 12.5 | 30 days supply | Qty: 60 | Fill #0

## 2016-10-27 MED FILL — ATORVASTATIN 80 MG TABLET: 80 | 30 days supply | Qty: 30 | Fill #0

## 2016-10-27 MED FILL — AMLODIPINE BESYLATE 10 MG T: 10 | 30 days supply | Qty: 30 | Fill #0

## 2016-10-27 MED FILL — GABAPENTIN 300 MG CAPSULE: 300 | 30 days supply | Qty: 90 | Fill #0

## 2016-10-27 NOTE — Progress Notes (Signed)
Subjective:  Patient ID: Rebekah Johnson, female    DOB: 07/23/1960  Age: 56 y.o. MRN: [id number]  CC: Hypertension   HPI Alphonsine Minium is a 56 year old female with a history of type 2 diabetes mellitus (A1c 8.8), diabetic neuropathy, peripheral vascular disease (status post revascularization in 08/2015), CAD (severe 2 V CAD from cardiac cath 09/2016 - medical management for now), Left foot transmetatarsal amputation, chronic kidney disease stage III, tobacco abuse, hypertension who presents today for a follow-up of hypertension.  Her blood pressure is significantly elevated today but at her visit with cardiology yesterday hydralazine was increased from 50 mg to 75 mg 3 times daily however she is yet to pick up the new dose. She informs me her Medicaid has expired and she is unable to afford her medications according pharmacy.  With regards to her diabetes she has been compliant with her medications and denies hypoglycemia. Her neuropathy is controlled on current dose of gabapentin. She was referred to nephrology due to chronic kidney disease but is yet to receive an appointment.  She denies chest pains or shortness of breath.  Past Medical History:  Diagnosis Date  . CAD (coronary artery disease)    a. NSTEM 09/2016: cath showing severe diffuse disease of the RCA, ramus and Cx with mild-mod disease of LAD; PCI would require multiple stents and significant contrast usage thus medical therapy recommended.  . Chronic diastolic CHF (congestive heart failure) ([place])   . CKD (chronic kidney disease), stage III ([place])   . Diabetes mellitus with neuropathy ([place])   . Foot amputation status ([place])    a. h/o left foot transmetatarsal amputation  . High cholesterol   . Hypertension    a. patent renal arteries by PV angio 08/2015. b. heavy proteinuria 09/2016 ? nephrotic.  [name] Normocytic anemia   . Obesity   . Proteinuria   . PVD (peripheral vascular disease) ([place])    a. status post left SFA and  popliteal stent and left SFA PTA with drug coated balloon in 08/2015.  . Tobacco abuse     Past Surgical History:  Procedure Laterality Date  . ABDOMINAL HYSTERECTOMY    . AMPUTATION Left 09/06/2015   Procedure: LEFT TRANSMETATARSAL AMPUTATTION;  Surgeon: Newt Minion, MD;  Location: [facility];  Service: Orthopedics;  Laterality: Left;  . LEFT HEART CATH AND CORONARY ANGIOGRAPHY N/A 09/17/2016   Procedure: LEFT HEART CATH AND CORONARY ANGIOGRAPHY;  Surgeon: Jettie Booze, MD;  Location: [facility];  Service: Cardiovascular;  Laterality: N/A;  . PERIPHERAL VASCULAR CATHETERIZATION N/A 08/16/2015   Procedure: Abdominal Aortogram;  Surgeon: Elam Dutch, MD;  Location: [facility];  Service: Cardiovascular;  Laterality: N/A;  . PERIPHERAL VASCULAR CATHETERIZATION Bilateral 08/16/2015   Procedure: Lower Extremity Angiography;  Surgeon: Elam Dutch, MD;  Location: [facility];  Service: Cardiovascular;  Laterality: Bilateral;  . PERIPHERAL VASCULAR CATHETERIZATION Left 08/16/2015   Procedure: Peripheral Vascular Intervention;  Surgeon: Elam Dutch, MD;  Location: [facility];  Service: Cardiovascular;  Laterality: Left;  SFA STENT X 2    No Known Allergies   Outpatient Medications Prior to Visit  Medication Sig Dispense Refill  . aspirin 81 MG EC tablet Take 1 tablet (81 mg total) by mouth daily. Reported on 07/13/2015 90 tablet 0  . blood glucose meter kit and supplies KIT Dispense based on patient and insurance preference. Use up to four times daily as directed. (FOR ICD-9 250.00, 250.01). 1 each  3  . Blood Glucose Monitoring Suppl (ACCU-CHEK AVIVA) device Use as instructed three times daily. 1 each 0  . Cholecalciferol (VITAMIN D-3) 1000 units CAPS Take 1,000 Units by mouth daily.     [name] docusate sodium (COLACE) 100 MG capsule TAKE ONE CAPSULE BY MOUTH TWICE A DAY 60 capsule 0  . glucose blood (ACCU-CHEK AVIVA) test strip Use as instructed three times  daily before meals. 100 each 12  . Lancet Devices (ACCU-CHEK SOFTCLIX) lancets Use as instructed three times daily before meals. 1 each 5  . nitroGLYCERIN (NITROSTAT) 0.4 MG SL tablet Place 1 tablet (0.4 mg total) under the tongue every 5 (five) minutes as needed for chest pain. 25 tablet 3  . Omega-3 Fatty Acids (FISH OIL) 1000 MG CAPS Take 1,000 mg by mouth daily. Reported on 07/13/2015    . amLODipine (NORVASC) 10 MG tablet Take 1 tablet (10 mg total) by mouth daily. Reported on 07/13/2015 30 tablet 3  . atorvastatin (LIPITOR) 80 MG tablet Take 1 tablet (80 mg total) by mouth daily. 30 tablet 0  . carvedilol (COREG) 12.5 MG tablet Take 1 tablet (12.5 mg total) by mouth 2 (two) times daily. 60 tablet 3  . clopidogrel (PLAVIX) 75 MG tablet Take 1 tablet (75 mg total) by mouth daily. 30 tablet 3  . gabapentin (NEURONTIN) 300 MG capsule Take 1 capsule (300 mg total) by mouth 3 (three) times daily.    [name] glipiZIDE (GLUCOTROL) 10 MG tablet Take 1 tablet (10 mg total) by mouth 2 (two) times daily before a meal. 60 tablet 3  . hydrALAZINE (APRESOLINE) 50 MG tablet Take 1.5 tablets (75 mg total) by mouth 3 (three) times daily. 135 tablet 4  . isosorbide mononitrate (IMDUR) 60 MG 24 hr tablet Take 1 tablet (60 mg total) by mouth daily. 30 tablet 3  . sitaGLIPtin (JANUVIA) 50 MG tablet Take 1 tablet (50 mg total) by mouth daily. 30 tablet 3   No facility-administered medications prior to visit.     ROS Review of Systems  Constitutional: Negative for activity change, appetite change and fatigue.  HENT: Negative for congestion, sinus pressure and sore throat.   Eyes: Negative for visual disturbance.  Respiratory: Negative for cough, chest tightness, shortness of breath and wheezing.   Cardiovascular: Negative for chest pain and palpitations.  Gastrointestinal: Negative for abdominal distention, abdominal pain and constipation.  Endocrine: Negative for polydipsia.  Genitourinary: Negative for dysuria and  frequency.  Musculoskeletal: Negative for arthralgias and back pain.  Skin: Negative for rash.  Neurological: Negative for tremors, light-headedness and numbness.  Hematological: Does not bruise/bleed easily.  Psychiatric/Behavioral: Negative for agitation and behavioral problems.    Objective:  BP (!) 198/79   Pulse 75   Temp (!) 97.5 F (36.4 C) (Oral)   Ht '5\' 4"'  (1.626 m)   Wt 201 lb 9.6 oz (91.4 kg)   SpO2 100%   BMI 34.60 kg/m   BP/Weight 10/27/2016 10/26/2016 09/05/5174  Systolic BP 160 737 106  Diastolic BP 79 64 77  Wt. (Lbs) 201.6 201 196.8  BMI 34.6 34.5 33.78      Physical Exam  Constitutional: She is oriented to person, place, and time. She appears well-developed and well-nourished.  Cardiovascular: Normal rate and normal heart sounds.   No murmur heard. Unable to palpate dorsalis pedis bilaterally  Pulmonary/Chest: Effort normal and breath sounds normal. She has no wheezes. She has no rales. She exhibits no tenderness.  Abdominal: Soft. Bowel sounds are normal. She  exhibits no distension and no mass. There is no tenderness.  Musculoskeletal: Normal range of motion.  Transmetatarsal amputation of left foot  Neurological: She is alert and oriented to person, place, and time.     Lab Results  Component Value Date   HGBA1C 8.8 (H) 09/14/2016    Assessment & Plan:   1. Type 2 diabetes mellitus with hyperosmolarity without coma, without long-term current use of insulin (HCC) Controlled with last A1c of 8.8 She continues to resist initiation of insulin Continue current management, diabetic diet, lifestyle modification A1c at next visit and will adjust regimen as indicated - POCT glucose (manual entry)  2. Peripheral vascular disease ([place]) Status post revascularization procedure in 08/2015 Risk factor modification - clopidogrel (PLAVIX) 75 MG tablet; Take 1 tablet (75 mg total) by mouth daily.  Dispense: 30 tablet; Refill: 3  3. Essential (primary)  hypertension Uncontrolled as she is yet to pick up new dose of hydralazine I have changed Hydralazine to 100 mg twice daily as she will have difficulties splitting her 50 mg pills to make 75 mg as previously prescribed. Advised to pick up medications from pharmacy in-house which be most cost effective for her tachycardia from her Medicaid has expired - isosorbide mononitrate (IMDUR) 60 MG 24 hr tablet; Take 1 tablet (60 mg total) by mouth daily.  Dispense: 30 tablet; Refill: 3 - carvedilol (COREG) 12.5 MG tablet; Take 1 tablet (12.5 mg total) by mouth 2 (two) times daily.  Dispense: 60 tablet; Refill: 3 - amLODipine (NORVASC) 10 MG tablet; Take 1 tablet (10 mg total) by mouth daily. Reported on 07/13/2015  Dispense: 30 tablet; Refill: 3  4. Other diabetic neurological complication associated with type 2 diabetes mellitus (HCC) Stable - sitaGLIPtin (JANUVIA) 50 MG tablet; Take 1 tablet (50 mg total) by mouth daily.  Dispense: 30 tablet; Refill: 3 - gabapentin (NEURONTIN) 300 MG capsule; Take 1 capsule (300 mg total) by mouth 3 (three) times daily.  Dispense: 90 capsule; Refill: 3 - glipiZIDE (GLUCOTROL) 10 MG tablet; Take 1 tablet (10 mg total) by mouth 2 (two) times daily before a meal.  Dispense: 60 tablet; Refill: 3  5. Stage 3 chronic kidney disease ([place]) Referred to nephrology Appointment pending  6. Coronary artery disease involving native coronary artery of native heart without angina pectoris Severe 2 vessel CAD Medical management as per cardiology   Meds ordered this encounter  Medications  . atorvastatin (LIPITOR) 80 MG tablet    Sig: Take 1 tablet (80 mg total) by mouth daily.    Dispense:  30 tablet    Refill:  3  . clopidogrel (PLAVIX) 75 MG tablet    Sig: Take 1 tablet (75 mg total) by mouth daily.    Dispense:  30 tablet    Refill:  3  . hydrALAZINE (APRESOLINE) 100 MG tablet    Sig: Take 1 tablet (100 mg total) by mouth 2 (two) times daily.    Dispense:  60 tablet     Refill:  3  . isosorbide mononitrate (IMDUR) 60 MG 24 hr tablet    Sig: Take 1 tablet (60 mg total) by mouth daily.    Dispense:  30 tablet    Refill:  3  . sitaGLIPtin (JANUVIA) 50 MG tablet    Sig: Take 1 tablet (50 mg total) by mouth daily.    Dispense:  30 tablet    Refill:  3  . gabapentin (NEURONTIN) 300 MG capsule    Sig: Take 1 capsule (300  mg total) by mouth 3 (three) times daily.    Dispense:  90 capsule    Refill:  3    To replace tablets  . carvedilol (COREG) 12.5 MG tablet    Sig: Take 1 tablet (12.5 mg total) by mouth 2 (two) times daily.    Dispense:  60 tablet    Refill:  3    Discontinue previous dose  . amLODipine (NORVASC) 10 MG tablet    Sig: Take 1 tablet (10 mg total) by mouth daily. Reported on 07/13/2015    Dispense:  30 tablet    Refill:  3  . glipiZIDE (GLUCOTROL) 10 MG tablet    Sig: Take 1 tablet (10 mg total) by mouth 2 (two) times daily before a meal.    Dispense:  60 tablet    Refill:  3    Follow-up: Return in about 2 months (around 12/27/2016) for follow up of Diabetes; cancel previous appointment.   Arnoldo Morale MD

## 2016-10-28 ENCOUNTER — Encounter (HOSPITAL_COMMUNITY): Payer: Medicaid Other

## 2016-10-28 ENCOUNTER — Ambulatory Visit: Payer: Medicaid Other | Admitting: Family

## 2016-10-29 ENCOUNTER — Other Ambulatory Visit: Payer: Self-pay

## 2016-10-29 DIAGNOSIS — E1149 Type 2 diabetes mellitus with other diabetic neurological complication: Secondary | ICD-10-CM

## 2016-10-29 MED ORDER — SITAGLIPTIN PHOSPHATE 50 MG PO TABS: 50.0000 mg | ORAL_TABLET | Freq: Every day | ORAL | 3 refills | Status: DC

## 2016-11-30 MED FILL — ?CARVEDILOL 12.5 MG TABLET: 12.5 | 30 days supply | Qty: 60 | Fill #1

## 2016-11-30 MED FILL — ?GLIPIZIDE 10 MG TABLET: 10 | 30 days supply | Qty: 60 | Fill #1

## 2016-11-30 MED FILL — hydrALAZINE HCL 100 MG TABS: 100 | 30 days supply | Qty: 60 | Fill #1

## 2016-11-30 MED FILL — $JANUVIA 50 MG TABLET: 50 | 30 days supply | Qty: 30 | Fill #1

## 2016-11-30 MED FILL — AMLODIPINE BESYLATE 10 MG T: 10 | 30 days supply | Qty: 30 | Fill #1

## 2016-11-30 MED FILL — ATORVASTATIN 80 MG TABLET: 80 | 30 days supply | Qty: 30 | Fill #1

## 2016-11-30 MED FILL — ISOSORBIDE MN ER 60 MG TAB: 60 | 30 days supply | Qty: 30 | Fill #3

## 2016-11-30 MED FILL — GABAPENTIN 300 MG CAPSULE: 300 | 30 days supply | Qty: 90 | Fill #1

## 2016-12-03 ENCOUNTER — Ambulatory Visit: Payer: Medicaid Other | Admitting: Family Medicine

## 2016-12-03 MED FILL — ?CLOPIDOGREL 75MG TAB: 75 | 30 days supply | Qty: 30 | Fill #3

## 2016-12-07 ENCOUNTER — Ambulatory Visit (INDEPENDENT_AMBULATORY_CARE_PROVIDER_SITE_OTHER): Payer: Medicaid Other | Admitting: Family

## 2016-12-07 ENCOUNTER — Encounter (INDEPENDENT_AMBULATORY_CARE_PROVIDER_SITE_OTHER): Payer: Self-pay | Admitting: Family

## 2016-12-07 VITALS — Ht 64.0 in | Wt 201.0 lb

## 2016-12-07 DIAGNOSIS — B351 Tinea unguium: Secondary | ICD-10-CM

## 2016-12-07 DIAGNOSIS — Z89432 Acquired absence of left foot: Secondary | ICD-10-CM | POA: Diagnosis not present

## 2016-12-07 DIAGNOSIS — E1100 Type 2 diabetes mellitus with hyperosmolarity without nonketotic hyperglycemic-hyperosmolar coma (NKHHC): Secondary | ICD-10-CM

## 2016-12-07 NOTE — Progress Notes (Signed)
Office Visit Note   Patient: Rebekah Johnson           Date of Birth: 1960-09-24           MRN: [id number] Visit Date: 12/07/2016              Requested by: Arnoldo Morale, MD [place], [place] [id number] PCP: Arnoldo Morale, MD  Chief Complaint  Patient presents with  . Left Foot - Follow-up    09/06/15 transmet amputation       HPI: Patient is a 56 year old woman who presents today for reevaluation of the right foot. Her left transmetatarsal amputation is well-healed.  Has been in regular shoe wear. Has been doing well.   Assessment & Plan: Visit Diagnoses:  No diagnosis found.  Plan: The patient will continue foot checks. Activities as tolerated. Nails trimmed without incident.  Follow-Up Instructions: No Follow-up on file.   Ortho Exam  Patient is alert, oriented, no adenopathy, well-dressed, normal affect, normal respiratory effort. Left transmetatarsal amputation is well-healed. The right foot has no ulcers, no drainage. No erythema. She does have thickened and discolored onychomycotic nails 5 right foot. Is unable to safely trim own nails. Nails trimmed today without incident.  Imaging: No results found.  Labs: Lab Results  Component Value Date   HGBA1C 8.8 (H) 09/14/2016   HGBA1C 9.4 09/02/2016   HGBA1C 8.0 06/02/2016   ESRSEDRATE 57 (H) 07/13/2015   CRP 0.7 07/13/2015   LABURIC 5.7 07/13/2015   REPTSTATUS 07/18/2015 FINAL 07/13/2015   CULT NO GROWTH 5 DAYS 07/13/2015    Orders:  No orders of the defined types were placed in this encounter.  No orders of the defined types were placed in this encounter.    Procedures: No procedures performed  Clinical Data: No additional findings.  ROS:  All other systems negative, except as noted in the HPI. Review of Systems  Constitutional: Negative for chills and fever.  Cardiovascular: Negative for leg swelling.  Skin: Negative for color change and wound.    Objective: Vital Signs: Ht  5\' 4"  (1.626 m)   Wt 201 lb (91.2 kg)   BMI 34.50 kg/m   Specialty Comments:  No specialty comments available.  PMFS History: Patient Active Problem List   Diagnosis Date Noted  . CAD (coronary artery disease) 10/27/2016  . Chronic kidney disease 09/23/2016  . Acute congestive heart failure ([place])   . Hypertensive heart disease with heart failure ([place])   . Acute diastolic CHF (congestive heart failure) ([place]) 09/14/2016  . Onychomycosis 12/24/2015  . Status post transmetatarsal amputation of foot, left ([place]) 09/06/2015  . Diabetic foot ulcer ([place]) 07/13/2015  . Cellulitis 07/12/2015  . HLD (hyperlipidemia) 09/02/2012  . Peripheral nerve disease 09/02/2012  . Type 2 diabetes mellitus with peripheral angiopathy ([place]) 09/02/2012  . Hypertriglyceridemia 07/28/2012  . Peripheral vascular disease ([place]) 07/28/2012  . Avitaminosis D 07/28/2012  . Essential (primary) hypertension 06/10/2012  . History of biliary T-tube placement 06/10/2012  . Leg pain 06/10/2012  . Current tobacco use 06/10/2012  . Atrophic vaginitis 06/07/2012  . Type 2 diabetes mellitus ([place]) 06/07/2012   Past Medical History:  Diagnosis Date  . CAD (coronary artery disease)    a. NSTEM 09/2016: cath showing severe diffuse disease of the RCA, ramus and Cx with mild-mod disease of LAD; PCI would require multiple stents and significant contrast usage thus medical therapy recommended.  . Chronic diastolic CHF (congestive heart failure) ([place])   . CKD (chronic  kidney disease), stage III ([place])   . Diabetes mellitus with neuropathy ([place])   . Foot amputation status ([place])    a. h/o left foot transmetatarsal amputation  . High cholesterol   . Hypertension    a. patent renal arteries by PV angio 08/2015. b. heavy proteinuria 09/2016 ? nephrotic.  [name] Normocytic anemia   . Obesity   . Proteinuria   . PVD (peripheral vascular disease) ([place])    a. status post left SFA and popliteal stent and left SFA PTA with drug coated balloon  in 08/2015.  . Tobacco abuse     Family History  Problem Relation Age of Onset  . Diabetes Mother   . Hypertension Father     Past Surgical History:  Procedure Laterality Date  . ABDOMINAL HYSTERECTOMY    . AMPUTATION Left 09/06/2015   Procedure: LEFT TRANSMETATARSAL AMPUTATTION;  Surgeon: Newt Minion, MD;  Location: [facility];  Service: Orthopedics;  Laterality: Left;  . LEFT HEART CATH AND CORONARY ANGIOGRAPHY N/A 09/17/2016   Procedure: LEFT HEART CATH AND CORONARY ANGIOGRAPHY;  Surgeon: Jettie Booze, MD;  Location: [facility];  Service: Cardiovascular;  Laterality: N/A;  . PERIPHERAL VASCULAR CATHETERIZATION N/A 08/16/2015   Procedure: Abdominal Aortogram;  Surgeon: Elam Dutch, MD;  Location: [facility];  Service: Cardiovascular;  Laterality: N/A;  . PERIPHERAL VASCULAR CATHETERIZATION Bilateral 08/16/2015   Procedure: Lower Extremity Angiography;  Surgeon: Elam Dutch, MD;  Location: [facility];  Service: Cardiovascular;  Laterality: Bilateral;  . PERIPHERAL VASCULAR CATHETERIZATION Left 08/16/2015   Procedure: Peripheral Vascular Intervention;  Surgeon: Elam Dutch, MD;  Location: [facility];  Service: Cardiovascular;  Laterality: Left;  SFA STENT X 2   Social History   Occupational History  . Not on file  Tobacco Use  . Smoking status: Current Every Day Smoker    Packs/day: 0.25    Types: Cigarettes  . Smokeless tobacco: Never Used  . Tobacco comment: 1 pk last 1 1/2 days  Substance and Sexual Activity  . Alcohol use: Yes    Alcohol/week: 0.6 - 1.2 oz    Types: 1 - 2 Glasses of wine per week    Comment: on social occassions  . Drug use: No  . Sexual activity: Yes    Partners: Male

## 2016-12-09 ENCOUNTER — Ambulatory Visit (INDEPENDENT_AMBULATORY_CARE_PROVIDER_SITE_OTHER)
Admission: RE | Admit: 2016-12-09 | Discharge: 2016-12-09 | Disposition: A | Discharge status: Home / Self Care | Payer: Self-pay | Source: Ambulatory Visit | Attending: Vascular Surgery | Admitting: Vascular Surgery

## 2016-12-09 ENCOUNTER — Encounter: Payer: Self-pay | Admitting: Vascular Surgery

## 2016-12-09 ENCOUNTER — Ambulatory Visit (INDEPENDENT_AMBULATORY_CARE_PROVIDER_SITE_OTHER): Payer: Self-pay | Admitting: Vascular Surgery

## 2016-12-09 ENCOUNTER — Ambulatory Visit (HOSPITAL_COMMUNITY)
Admission: RE | Admit: 2016-12-09 | Discharge: 2016-12-09 | Disposition: A | Discharge status: Home / Self Care | Payer: Self-pay | Source: Ambulatory Visit | Attending: Vascular Surgery | Admitting: Vascular Surgery

## 2016-12-09 VITALS — BP 187/88 | HR 76 | Temp 97.1°F | Resp 20 | Ht 64.0 in | Wt 198.7 lb

## 2016-12-09 DIAGNOSIS — I70262 Atherosclerosis of native arteries of extremities with gangrene, left leg: Secondary | ICD-10-CM

## 2016-12-09 DIAGNOSIS — I70219 Atherosclerosis of native arteries of extremities with intermittent claudication, unspecified extremity: Secondary | ICD-10-CM

## 2016-12-09 NOTE — Progress Notes (Signed)
Patient name: Rebekah Johnson MRN: [id number] DOB: 02/24/1960 Sex: female  REASON FOR VISIT:   Follow-up of peripheral vascular disease.  HPI:   Rebekah Johnson is a pleasant 56 y.o. female who I last saw on 05/27/2016.  The patient had presented with a left foot wound.  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  She underwent an arteriogram was found to have an occlusion of the distal superficial femoral artery and popliteal artery.  This was successfully ballooned and stented by Dr. Oneida Alar.  She comes in for routine follow-up visit.  Since I saw her last, she denies any claudication, rest pain, or nonhealing ulcers.  She had a transmetatarsal amputation  on the left by Dr. Sharol Given in August 2017.  She does continue to smoke but is continuing to work on this.  She is down to about 3 cigarettes a day.  I reviewed her previous operative report from 08/16/2015.  She had a left superficial femoral artery and popliteal artery stent and left superficial femoral artery PTA with drug-coated balloon.  Past Medical History:  Diagnosis Date  . CAD (coronary artery disease)    a. NSTEM 09/2016: cath showing severe diffuse disease of the RCA, ramus and Cx with mild-mod disease of LAD; PCI would require multiple stents and significant contrast usage thus medical therapy recommended.  . Chronic diastolic CHF (congestive heart failure) ([place])   . CKD (chronic kidney disease), stage III ([place])   . Diabetes mellitus with neuropathy ([place])   . Foot amputation status ([place])    a. h/o left foot transmetatarsal amputation  . High cholesterol   . Hypertension    a. patent renal arteries by PV angio 08/2015. b. heavy proteinuria 09/2016 ? nephrotic.  [name] Normocytic anemia   . Obesity   . Proteinuria   . PVD (peripheral vascular disease) ([place])    a. status post left SFA and popliteal stent and left SFA PTA with drug coated balloon in 08/2015.  . Tobacco abuse     Family History    Problem Relation Age of Onset  . Diabetes Mother   . Hypertension Father     SOCIAL HISTORY: Social History   Tobacco Use  . Smoking status: Current Every Day Smoker    Packs/day: 0.25    Types: Cigarettes  . Smokeless tobacco: Never Used  . Tobacco comment: Down to 3 cigarettes per day  Substance Use Topics  . Alcohol use: Yes    Alcohol/week: 0.6 - 1.2 oz    Types: 1 - 2 Glasses of wine per week    Comment: on social occassions    No Known Allergies  Current Outpatient Medications  Medication Sig Dispense Refill  . amLODipine (NORVASC) 10 MG tablet Take 1 tablet (10 mg total) by mouth daily. Reported on 07/13/2015 30 tablet 3  . aspirin 81 MG EC tablet Take 1 tablet (81 mg total) by mouth daily. Reported on 07/13/2015 90 tablet 0  . atorvastatin (LIPITOR) 80 MG tablet Take 1 tablet (80 mg total) by mouth daily. 30 tablet 3  . blood glucose meter kit and supplies KIT Dispense based on patient and insurance preference. Use up to four times daily as directed. (FOR ICD-9 250.00, 250.01). 1 each 3  . Blood Glucose Monitoring Suppl (ACCU-CHEK AVIVA) device Use as instructed three times daily. 1 each 0  . carvedilol (COREG) 12.5 MG tablet Take 1 tablet (12.5 mg total) by mouth 2 (two) times daily. 60 tablet 3  .  Cholecalciferol (VITAMIN D-3) 1000 units CAPS Take 1,000 Units by mouth daily.     . clopidogrel (PLAVIX) 75 MG tablet Take 1 tablet (75 mg total) by mouth daily. 30 tablet 3  . docusate sodium (COLACE) 100 MG capsule TAKE ONE CAPSULE BY MOUTH TWICE A DAY 60 capsule 0  . gabapentin (NEURONTIN) 300 MG capsule Take 1 capsule (300 mg total) by mouth 3 (three) times daily. 90 capsule 3  . glipiZIDE (GLUCOTROL) 10 MG tablet Take 1 tablet (10 mg total) by mouth 2 (two) times daily before a meal. 60 tablet 3  . glucose blood (ACCU-CHEK AVIVA) test strip Use as instructed three times daily before meals. 100 each 12  . hydrALAZINE (APRESOLINE) 100 MG tablet Take 1 tablet (100 mg total)  by mouth 2 (two) times daily. 60 tablet 3  . isosorbide mononitrate (IMDUR) 60 MG 24 hr tablet Take 1 tablet (60 mg total) by mouth daily. 30 tablet 3  . Lancet Devices (ACCU-CHEK SOFTCLIX) lancets Use as instructed three times daily before meals. 1 each 5  . nitroGLYCERIN (NITROSTAT) 0.4 MG SL tablet Place 1 tablet (0.4 mg total) under the tongue every 5 (five) minutes as needed for chest pain. 25 tablet 3  . Omega-3 Fatty Acids (FISH OIL) 1000 MG CAPS Take 1,000 mg by mouth daily. Reported on 07/13/2015    . sitaGLIPtin (JANUVIA) 50 MG tablet Take 1 tablet (50 mg total) by mouth daily. 90 tablet 3   No current facility-administered medications for this visit.     REVIEW OF SYSTEMS:  '[X]'  denotes positive finding, '[ ]'  denotes negative finding Cardiac  Comments:  Chest pain or chest pressure:    Shortness of breath upon exertion: X   Short of breath when lying flat: X   Irregular heart rhythm:        Vascular    Pain in calf, thigh, or hip brought on by ambulation:    Pain in feet at night that wakes you up from your sleep:     Blood clot in your veins:    Leg swelling:         Pulmonary    Oxygen at home:    Productive cough:     Wheezing:         Neurologic    Sudden weakness in arms or legs:     Sudden numbness in arms or legs:     Sudden onset of difficulty speaking or slurred speech:    Temporary loss of vision in one eye:     Problems with dizziness:         Gastrointestinal    Blood in stool:     Vomited blood:         Genitourinary    Burning when urinating:     Blood in urine:        Psychiatric    Major depression:         Hematologic    Bleeding problems:    Problems with blood clotting too easily:        Skin    Rashes or ulcers:        Constitutional    Fever or chills:     PHYSICAL EXAM:   Vitals:   12/09/16 1215 12/09/16 1220  BP: (!) 180/94 (!) 187/88  Pulse: 76   Resp: 20   Temp: (!) 97.1 F (36.2 C)   TempSrc: Oral   SpO2: 100%    Weight: 198 lb 11.2 oz (  90.1 kg)   Height: '5\' 4"'  (1.626 m)     GENERAL: The patient is a well-nourished female, in no acute distress. The vital signs are documented above. CARDIAC: There is a regular rate and rhythm.  VASCULAR: I do not detect carotid bruits. She has palpable femoral pulses. I cannot palpate popliteal or pedal pulses. She has no significant lower extremity swelling. PULMONARY: There is good air exchange bilaterally without wheezing or rales. ABDOMEN: Soft and non-tender with normal pitched bowel sounds.  MUSCULOSKELETAL: There are no major deformities or cyanosis. NEUROLOGIC: No focal weakness or paresthesias are detected. SKIN: There are no ulcers or rashes noted. PSYCHIATRIC: The patient has a normal affect.  DATA:    LEFT LOWER EXTREMITY ARTERIAL DUPLEX: I have independently interpreted the duplex of the left lower extremity.  This shows a greater than 75% stenosis in the left common femoral artery.  The stent appears to be patent however there is plaque visualized in the mid to distal segment of the stent.  The velocities in the common femoral artery on the left have increased compared to the study back in May.  BILATERAL LOWER EXTREMITY ARTERIAL DOPPLER STUDY: I have independently interpreted the Doppler study today.  On the left side, there is a monophasic dorsalis pedis and posterior tibial signal.  ABIs 69%.  On the left side there is a monophasic dorsalis pedis and posterior tibial signal.  ABIs 67%.  MEDICAL ISSUES:   STATUS POST ANGIOPLASTY AND STENTING OF THE LEFT SUPERFICIAL FEMORAL ARTERY AND POPLITEAL ARTERY: Or stent on the left is patent.  She does have some stenosis in her common femoral artery but is asymptomatic.  She has stage III chronic kidney disease.  I reviewed her previous discharge summary and she was asked to be referred to nephrology but this is not taken place.  I would be reluctant to aggressively pursue her common femoral artery  stenosis given that she is asymptomatic and given the risk of contrast arteriography.  In addition given that the stenosis is in the common femoral artery this would likely not be treatable with a stent and would require open surgery.  I have ordered a follow-up duplex in 6 months and I will see her back at that time.  We will also try to get her a referral to the nephrologist as she was told that this needed to happen after her most recent discharge.  At least that is her understanding of this.  HYPERTENSION: The patient's initial blood pressure today was elevated. We repeated this and this was still elevated. We have encouraged the patient to follow up with their primary care physician for management of their blood pressure.   Deitra Mayo [facility] [phone]

## 2016-12-10 NOTE — Addendum Note (Signed)
Addended by: Lianne Cure A on: 12/10/2016 11:30 AM   Modules accepted: Orders

## 2016-12-30 ENCOUNTER — Ambulatory Visit: Payer: Self-pay | Admitting: Family Medicine

## 2016-12-30 DIAGNOSIS — E669 Obesity, unspecified: Secondary | ICD-10-CM | POA: Insufficient documentation

## 2016-12-30 DIAGNOSIS — D649 Anemia, unspecified: Secondary | ICD-10-CM | POA: Insufficient documentation

## 2016-12-30 DIAGNOSIS — R809 Proteinuria, unspecified: Secondary | ICD-10-CM | POA: Insufficient documentation

## 2016-12-30 DIAGNOSIS — Z720 Tobacco use: Secondary | ICD-10-CM | POA: Insufficient documentation

## 2016-12-30 DIAGNOSIS — I10 Essential (primary) hypertension: Secondary | ICD-10-CM | POA: Insufficient documentation

## 2016-12-30 DIAGNOSIS — I5032 Chronic diastolic (congestive) heart failure: Secondary | ICD-10-CM | POA: Insufficient documentation

## 2016-12-30 DIAGNOSIS — I739 Peripheral vascular disease, unspecified: Secondary | ICD-10-CM | POA: Insufficient documentation

## 2016-12-30 DIAGNOSIS — E1140 Type 2 diabetes mellitus with diabetic neuropathy, unspecified: Secondary | ICD-10-CM | POA: Insufficient documentation

## 2016-12-30 DIAGNOSIS — E7800 Pure hypercholesterolemia, unspecified: Secondary | ICD-10-CM | POA: Insufficient documentation

## 2016-12-30 DIAGNOSIS — N184 Chronic kidney disease, stage 4 (severe): Secondary | ICD-10-CM | POA: Insufficient documentation

## 2016-12-30 DIAGNOSIS — IMO0002 Reserved for concepts with insufficient information to code with codable children: Secondary | ICD-10-CM | POA: Insufficient documentation

## 2016-12-30 MED FILL — ATORVASTATIN 80 MG TABLET: 80 | 30 days supply | Qty: 30 | Fill #2

## 2016-12-30 MED FILL — ISOSORBIDE MN ER 60 MG TAB: 60 | 30 days supply | Qty: 30 | Fill #1

## 2016-12-30 MED FILL — AMLODIPINE BESYLATE 10 MG T: 10 | 30 days supply | Qty: 30 | Fill #2

## 2016-12-30 MED FILL — ?GLIPIZIDE 10 MG TABLET: 10 | 30 days supply | Qty: 60 | Fill #2

## 2016-12-30 MED FILL — $JANUVIA 50 MG TABLET: 50 | 30 days supply | Qty: 30 | Fill #2

## 2016-12-30 MED FILL — hydrALAZINE HCL 100 MG TABS: 100 | 30 days supply | Qty: 60 | Fill #2

## 2016-12-30 MED FILL — ?CLOPIDOGREL 75MG TAB: 75 | 30 days supply | Qty: 30 | Fill #1

## 2016-12-31 ENCOUNTER — Encounter: Payer: Self-pay | Admitting: Family Medicine

## 2016-12-31 ENCOUNTER — Ambulatory Visit: Payer: Self-pay | Attending: Family Medicine | Admitting: Family Medicine

## 2016-12-31 VITALS — BP 179/68 | HR 74 | Temp 98.4°F | Ht 64.0 in | Wt 201.4 lb

## 2016-12-31 DIAGNOSIS — Z1211 Encounter for screening for malignant neoplasm of colon: Secondary | ICD-10-CM

## 2016-12-31 DIAGNOSIS — Z7984 Long term (current) use of oral hypoglycemic drugs: Secondary | ICD-10-CM | POA: Insufficient documentation

## 2016-12-31 DIAGNOSIS — Z79899 Other long term (current) drug therapy: Secondary | ICD-10-CM | POA: Insufficient documentation

## 2016-12-31 DIAGNOSIS — E1151 Type 2 diabetes mellitus with diabetic peripheral angiopathy without gangrene: Secondary | ICD-10-CM | POA: Insufficient documentation

## 2016-12-31 DIAGNOSIS — I5032 Chronic diastolic (congestive) heart failure: Secondary | ICD-10-CM | POA: Insufficient documentation

## 2016-12-31 DIAGNOSIS — N183 Chronic kidney disease, stage 3 unspecified: Secondary | ICD-10-CM

## 2016-12-31 DIAGNOSIS — I2510 Atherosclerotic heart disease of native coronary artery without angina pectoris: Secondary | ICD-10-CM | POA: Insufficient documentation

## 2016-12-31 DIAGNOSIS — E1122 Type 2 diabetes mellitus with diabetic chronic kidney disease: Secondary | ICD-10-CM | POA: Insufficient documentation

## 2016-12-31 DIAGNOSIS — Z1239 Encounter for other screening for malignant neoplasm of breast: Secondary | ICD-10-CM

## 2016-12-31 DIAGNOSIS — Z1231 Encounter for screening mammogram for malignant neoplasm of breast: Secondary | ICD-10-CM

## 2016-12-31 DIAGNOSIS — Z7982 Long term (current) use of aspirin: Secondary | ICD-10-CM | POA: Insufficient documentation

## 2016-12-31 DIAGNOSIS — E1142 Type 2 diabetes mellitus with diabetic polyneuropathy: Secondary | ICD-10-CM | POA: Insufficient documentation

## 2016-12-31 DIAGNOSIS — Z7902 Long term (current) use of antithrombotics/antiplatelets: Secondary | ICD-10-CM | POA: Insufficient documentation

## 2016-12-31 DIAGNOSIS — Z720 Tobacco use: Secondary | ICD-10-CM | POA: Insufficient documentation

## 2016-12-31 DIAGNOSIS — E7800 Pure hypercholesterolemia, unspecified: Secondary | ICD-10-CM | POA: Insufficient documentation

## 2016-12-31 DIAGNOSIS — I10 Essential (primary) hypertension: Secondary | ICD-10-CM

## 2016-12-31 DIAGNOSIS — I130 Hypertensive heart and chronic kidney disease with heart failure and stage 1 through stage 4 chronic kidney disease, or unspecified chronic kidney disease: Secondary | ICD-10-CM | POA: Insufficient documentation

## 2016-12-31 DIAGNOSIS — E1149 Type 2 diabetes mellitus with other diabetic neurological complication: Secondary | ICD-10-CM

## 2016-12-31 DIAGNOSIS — I739 Peripheral vascular disease, unspecified: Secondary | ICD-10-CM

## 2016-12-31 LAB — POCT GLYCOSYLATED HEMOGLOBIN (HGB A1C): Hemoglobin A1C: 7.8

## 2016-12-31 LAB — GLUCOSE, POCT (MANUAL RESULT ENTRY): POC Glucose: 271 mg/dl — AB (ref 70–99)

## 2016-12-31 MED ORDER — CLOPIDOGREL BISULFATE 75 MG PO TABS: 75.0000 mg | ORAL_TABLET | Freq: Every day | ORAL | 3 refills | Status: DC

## 2016-12-31 MED ORDER — ATORVASTATIN CALCIUM 80 MG PO TABS: 80.0000 mg | ORAL_TABLET | Freq: Every day | ORAL | 3 refills | Status: DC

## 2016-12-31 MED ORDER — HYDRALAZINE HCL 100 MG PO TABS: 100.0000 mg | ORAL_TABLET | Freq: Two times a day (BID) | ORAL | 3 refills | Status: DC

## 2016-12-31 MED ORDER — AMLODIPINE BESYLATE 10 MG PO TABS: 10.0000 mg | ORAL_TABLET | Freq: Every day | ORAL | 3 refills | Status: DC

## 2016-12-31 MED ORDER — CARVEDILOL 25 MG PO TABS: 25.0000 mg | ORAL_TABLET | Freq: Two times a day (BID) | ORAL | 3 refills | Status: DC

## 2016-12-31 MED ORDER — ISOSORBIDE MONONITRATE ER 60 MG PO TB24: 60.0000 mg | ORAL_TABLET | Freq: Every day | ORAL | 3 refills | Status: DC

## 2016-12-31 MED ORDER — GLIPIZIDE 10 MG PO TABS: 10.0000 mg | ORAL_TABLET | Freq: Two times a day (BID) | ORAL | 3 refills | Status: DC

## 2016-12-31 MED ORDER — GABAPENTIN 300 MG PO CAPS: 300.0000 mg | ORAL_CAPSULE | Freq: Three times a day (TID) | ORAL | 3 refills | Status: DC

## 2016-12-31 MED ORDER — SITAGLIPTIN PHOSPHATE 50 MG PO TABS: 50.0000 mg | ORAL_TABLET | Freq: Every day | ORAL | 3 refills | Status: DC

## 2016-12-31 MED FILL — ?CARVEDILOL 25 MG TABLET: 25 | 30 days supply | Qty: 60 | Fill #0

## 2016-12-31 MED FILL — GABAPENTIN 300 MG CAPSULE: 300 | 30 days supply | Qty: 90 | Fill #0

## 2016-12-31 NOTE — Patient Instructions (Signed)
Diabetes Mellitus and Nutrition When you have diabetes (diabetes mellitus), it is very important to have healthy eating habits because your blood sugar (glucose) levels are greatly affected by what you eat and drink. Eating healthy foods in the appropriate amounts, at about the same times every day, can help you:  Control your blood glucose.  Lower your risk of heart disease.  Improve your blood pressure.  Reach or maintain a healthy weight.  Every person with diabetes is different, and each person has different needs for a meal plan. Your health care provider may recommend that you work with a diet and nutrition specialist (dietitian) to make a meal plan that is best for you. Your meal plan may vary depending on factors such as:  The calories you need.  The medicines you take.  Your weight.  Your blood glucose, blood pressure, and cholesterol levels.  Your activity level.  Other health conditions you have, such as heart or kidney disease.  How do carbohydrates affect me? Carbohydrates affect your blood glucose level more than any other type of food. Eating carbohydrates naturally increases the amount of glucose in your blood. Carbohydrate counting is a method for keeping track of how many carbohydrates you eat. Counting carbohydrates is important to keep your blood glucose at a healthy level, especially if you use insulin or take certain oral diabetes medicines. It is important to know how many carbohydrates you can safely have in each meal. This is different for every person. Your dietitian can help you calculate how many carbohydrates you should have at each meal and for snack. Foods that contain carbohydrates include:  Bread, cereal, rice, pasta, and crackers.  Potatoes and corn.  Peas, beans, and lentils.  Milk and yogurt.  Fruit and juice.  Desserts, such as cakes, cookies, ice cream, and candy.  How does alcohol affect me? Alcohol can cause a sudden decrease in blood  glucose (hypoglycemia), especially if you use insulin or take certain oral diabetes medicines. Hypoglycemia can be a life-threatening condition. Symptoms of hypoglycemia (sleepiness, dizziness, and confusion) are similar to symptoms of having too much alcohol. If your health care provider says that alcohol is safe for you, follow these guidelines:  Limit alcohol intake to no more than 1 drink per day for nonpregnant women and 2 drinks per day for men. One drink equals 12 oz of beer, 5 oz of wine, or 1 oz of hard liquor.  Do not drink on an empty stomach.  Keep yourself hydrated with water, diet soda, or unsweetened iced tea.  Keep in mind that regular soda, juice, and other mixers may contain a lot of sugar and must be counted as carbohydrates.  What are tips for following this plan? Reading food labels  Start by checking the serving size on the label. The amount of calories, carbohydrates, fats, and other nutrients listed on the label are based on one serving of the food. Many foods contain more than one serving per package.  Check the total grams (g) of carbohydrates in one serving. You can calculate the number of servings of carbohydrates in one serving by dividing the total carbohydrates by 15. For example, if a food has 30 g of total carbohydrates, it would be equal to 2 servings of carbohydrates.  Check the number of grams (g) of saturated and trans fats in one serving. Choose foods that have low or no amount of these fats.  Check the number of milligrams (mg) of sodium in one serving. Most people   should limit total sodium intake to less than 2,300 mg per day.  Always check the nutrition information of foods labeled as "low-fat" or "nonfat". These foods may be higher in added sugar or refined carbohydrates and should be avoided.  Talk to your dietitian to identify your daily goals for nutrients listed on the label. Shopping  Avoid buying canned, premade, or processed foods. These  foods tend to be high in fat, sodium, and added sugar.  Shop around the outside edge of the grocery store. This includes fresh fruits and vegetables, bulk grains, fresh meats, and fresh dairy. Cooking  Use low-heat cooking methods, such as baking, instead of high-heat cooking methods like deep frying.  Cook using healthy oils, such as olive, canola, or sunflower oil.  Avoid cooking with butter, cream, or high-fat meats. Meal planning  Eat meals and snacks regularly, preferably at the same times every day. Avoid going long periods of time without eating.  Eat foods high in fiber, such as fresh fruits, vegetables, beans, and whole grains. Talk to your dietitian about how many servings of carbohydrates you can eat at each meal.  Eat 4-6 ounces of lean protein each day, such as lean meat, chicken, fish, eggs, or tofu. 1 ounce is equal to 1 ounce of meat, chicken, or fish, 1 egg, or 1/4 cup of tofu.  Eat some foods each day that contain healthy fats, such as avocado, nuts, seeds, and fish. Lifestyle   Check your blood glucose regularly.  Exercise at least 30 minutes 5 or more days each week, or as told by your health care provider.  Take medicines as told by your health care provider.  Do not use any products that contain nicotine or tobacco, such as cigarettes and e-cigarettes. If you need help quitting, ask your health care provider.  Work with a counselor or diabetes educator to identify strategies to manage stress and any emotional and social challenges. What are some questions to ask my health care provider?  Do I need to meet with a diabetes educator?  Do I need to meet with a dietitian?  What number can I call if I have questions?  When are the best times to check my blood glucose? Where to find more information:  American Diabetes Association: diabetes.org/food-and-fitness/food  Academy of Nutrition and Dietetics:  www.eatright.org/resources/health/diseases-and-conditions/diabetes  National Institute of Diabetes and Digestive and Kidney Diseases (NIH): www.niddk.nih.gov/health-information/diabetes/overview/diet-eating-physical-activity Summary  A healthy meal plan will help you control your blood glucose and maintain a healthy lifestyle.  Working with a diet and nutrition specialist (dietitian) can help you make a meal plan that is best for you.  Keep in mind that carbohydrates and alcohol have immediate effects on your blood glucose levels. It is important to count carbohydrates and to use alcohol carefully. This information is not intended to replace advice given to you by your health care provider. Make sure you discuss any questions you have with your health care provider. Document Released: 09/18/2004 Document Revised: 01/27/2016 Document Reviewed: 01/27/2016 Elsevier Interactive Patient Education  2018 [employer]  

## 2016-12-31 NOTE — Progress Notes (Signed)
Subjective:  Patient ID: Rebekah Johnson, female    DOB: 08/24/1960  Age: 56 y.o. MRN: [id number]  CC: Diabetes mellitus  HPI Rebekah Johnson is a 56 year old female with a history of type 2 diabetes mellitus (A1c 7.8), diabetic neuropathy, peripheral vascular disease (status post left SFA and popliteal artery stent and left SFA PTA with drug-coated balloon in 08/2015), CAD (severe 2 V CAD from cardiac cath 09/2016 - medical management for now), Left foot transmetatarsal amputation, chronic kidney disease stage III, tobacco abuse, hypertension  Her A1c is 7.8 and has improved from 8.8 previously.  We had discussed initiation of insulin for optimization of glycemic control at her last visit which she had declined.  She is working on adhering to a diabetic diet but does not exercise and has been checking her sugars intermittently. Declines hypoglycemia. Neuropathy is controlled on gabapentin.  Her blood pressure is elevated and she endorses compliance with all her medications but has not been compliant with a low-sodium diet.  She was recently seen by nephrology due to chronic kidney disease and was commenced on Lasix.  She denies chest pain but does have paroxysmal nocturnal dyspnea and orthopnea.  She denies pedal edema.  2D echo from 09/2016 revealed EF of 55-60% with grade 2 diastolic dysfunction. Cardiac catheter revealed severe two-vessel coronary artery disease, PCI would require multiple stents and significant contrast use , for now we will treat medically. Last visit to vascular surgery-Dr. Scot Dock was 3 weeks ago.  She does have common femoral artery stenosis which is asymptomatic, not treatable with stent but would require open surgery.  Given risk of contrast arthrography recommendation was to follow up in 6 months.  Past Medical History:  Diagnosis Date  . CAD (coronary artery disease)    a. NSTEM 09/2016: cath showing severe diffuse disease of the RCA, ramus and Cx with mild-mod  disease of LAD; PCI would require multiple stents and significant contrast usage thus medical therapy recommended.  . Chronic diastolic CHF (congestive heart failure) ([place])   . CKD (chronic kidney disease), stage III ([place])   . Diabetes mellitus with neuropathy ([place])   . Foot amputation status ([place])    a. h/o left foot transmetatarsal amputation  . High cholesterol   . Hypertension    a. patent renal arteries by PV angio 08/2015. b. heavy proteinuria 09/2016 ? nephrotic.  [name] Normocytic anemia   . Obesity   . Proteinuria   . PVD (peripheral vascular disease) ([place])    a. status post left SFA and popliteal stent and left SFA PTA with drug coated balloon in 08/2015.  . Tobacco abuse     Past Surgical History:  Procedure Laterality Date  . ABDOMINAL HYSTERECTOMY    . AMPUTATION Left 09/06/2015   Procedure: LEFT TRANSMETATARSAL AMPUTATTION;  Surgeon: Newt Minion, MD;  Location: [facility];  Service: Orthopedics;  Laterality: Left;  . LEFT HEART CATH AND CORONARY ANGIOGRAPHY N/A 09/17/2016   Procedure: LEFT HEART CATH AND CORONARY ANGIOGRAPHY;  Surgeon: Jettie Booze, MD;  Location: [facility];  Service: Cardiovascular;  Laterality: N/A;  . PERIPHERAL VASCULAR CATHETERIZATION N/A 08/16/2015   Procedure: Abdominal Aortogram;  Surgeon: Elam Dutch, MD;  Location: [facility];  Service: Cardiovascular;  Laterality: N/A;  . PERIPHERAL VASCULAR CATHETERIZATION Bilateral 08/16/2015   Procedure: Lower Extremity Angiography;  Surgeon: Elam Dutch, MD;  Location: [facility];  Service: Cardiovascular;  Laterality: Bilateral;  . PERIPHERAL VASCULAR CATHETERIZATION Left 08/16/2015  Procedure: Peripheral Vascular Intervention;  Surgeon: Elam Dutch, MD;  Location: [facility];  Service: Cardiovascular;  Laterality: Left;  SFA STENT X 2    No Known Allergies   Outpatient Medications Prior to Visit  Medication Sig Dispense Refill  . aspirin 81 MG EC tablet Take 1  tablet (81 mg total) by mouth daily. Reported on 07/13/2015 90 tablet 0  . Cholecalciferol (VITAMIN D-3) 1000 units CAPS Take 1,000 Units by mouth daily.     [name] docusate sodium (COLACE) 100 MG capsule TAKE ONE CAPSULE BY MOUTH TWICE A DAY 60 capsule 0  . Omega-3 Fatty Acids (FISH OIL) 1000 MG CAPS Take 1,000 mg by mouth daily. Reported on 07/13/2015    . amLODipine (NORVASC) 10 MG tablet Take 1 tablet (10 mg total) by mouth daily. Reported on 07/13/2015 30 tablet 3  . atorvastatin (LIPITOR) 80 MG tablet Take 1 tablet (80 mg total) by mouth daily. 30 tablet 3  . carvedilol (COREG) 12.5 MG tablet Take 1 tablet (12.5 mg total) by mouth 2 (two) times daily. 60 tablet 3  . clopidogrel (PLAVIX) 75 MG tablet Take 1 tablet (75 mg total) by mouth daily. 30 tablet 3  . gabapentin (NEURONTIN) 300 MG capsule Take 1 capsule (300 mg total) by mouth 3 (three) times daily. 90 capsule 3  . glipiZIDE (GLUCOTROL) 10 MG tablet Take 1 tablet (10 mg total) by mouth 2 (two) times daily before a meal. 60 tablet 3  . hydrALAZINE (APRESOLINE) 100 MG tablet Take 1 tablet (100 mg total) by mouth 2 (two) times daily. 60 tablet 3  . isosorbide mononitrate (IMDUR) 60 MG 24 hr tablet Take 1 tablet (60 mg total) by mouth daily. 30 tablet 3  . sitaGLIPtin (JANUVIA) 50 MG tablet Take 1 tablet (50 mg total) by mouth daily. 90 tablet 3  . blood glucose meter kit and supplies KIT Dispense based on patient and insurance preference. Use up to four times daily as directed. (FOR ICD-9 250.00, 250.01). (Patient not taking: Reported on 12/31/2016) 1 each 3  . Blood Glucose Monitoring Suppl (ACCU-CHEK AVIVA) device Use as instructed three times daily. (Patient not taking: Reported on 12/31/2016) 1 each 0  . glucose blood (ACCU-CHEK AVIVA) test strip Use as instructed three times daily before meals. (Patient not taking: Reported on 12/31/2016) 100 each 12  . Lancet Devices (ACCU-CHEK SOFTCLIX) lancets Use as instructed three times daily before meals.  (Patient not taking: Reported on 12/31/2016) 1 each 5  . nitroGLYCERIN (NITROSTAT) 0.4 MG SL tablet Place 1 tablet (0.4 mg total) under the tongue every 5 (five) minutes as needed for chest pain. (Patient not taking: Reported on 12/31/2016) 25 tablet 3   No facility-administered medications prior to visit.     ROS Review of Systems  Constitutional: Negative for activity change, appetite change and fatigue.  HENT: Negative for congestion, sinus pressure and sore throat.   Eyes: Negative for visual disturbance.  Respiratory: Negative for cough, chest tightness, shortness of breath and wheezing.   Cardiovascular: Negative for chest pain and palpitations.  Gastrointestinal: Negative for abdominal distention, abdominal pain and constipation.  Endocrine: Negative for polydipsia.  Genitourinary: Negative for dysuria and frequency.  Musculoskeletal: Negative for arthralgias and back pain.  Skin: Negative for rash.  Neurological: Negative for tremors, light-headedness and numbness.  Hematological: Does not bruise/bleed easily.  Psychiatric/Behavioral: Negative for agitation and behavioral problems.    Objective:  BP (!) 179/68   Pulse 74   Temp 98.4  F (36.9 C) (Oral)   Ht '5\' 4"'  (1.626 m)   Wt 201 lb 6.4 oz (91.4 kg)   SpO2 100%   BMI 34.57 kg/m   BP/Weight 12/31/2016 12/09/2016 41/06/3843  Systolic BP 364 680 -  Diastolic BP 68 88 -  Wt. (Lbs) 201.4 198.7 201  BMI 34.57 34.11 34.5      Physical Exam  Constitutional: She is oriented to person, place, and time. She appears well-developed and well-nourished.  Cardiovascular: Normal rate and normal heart sounds.  No murmur heard. Unable to palpate dorsalis pedis bilaterally  Pulmonary/Chest: Effort normal and breath sounds normal. She has no wheezes. She has no rales. She exhibits no tenderness.  Abdominal: Soft. Bowel sounds are normal. She exhibits no distension and no mass. There is no tenderness.  Musculoskeletal: Normal range  of motion.  Left foot transmetatarsal amputation  Neurological: She is alert and oriented to person, place, and time.  Skin: Skin is warm and dry.  Psychiatric: She has a normal mood and affect.     Lab Results  Component Value Date   HGBA1C 7.8 12/31/2016    Assessment & Plan:   1. Type 2 diabetes mellitus with peripheral angiopathy (HCC) A1c of 7.8 which has improved from 8.8 previously She continues to resist addition of insulin Continue current regimen, diabetic diet - POCT glucose (manual entry) - POCT glycosylated hemoglobin (Hb A1C)  2. Essential (primary) hypertension Well-controlled Increased dose of carvedilol Low-sodium, DASH diet - carvedilol (COREG) 25 MG tablet; Take 1 tablet (25 mg total) by mouth 2 (two) times daily.  Dispense: 60 tablet; Refill: 3 - amLODipine (NORVASC) 10 MG tablet; Take 1 tablet (10 mg total) by mouth daily. Reported on 07/13/2015  Dispense: 30 tablet; Refill: 3 - isosorbide mononitrate (IMDUR) 60 MG 24 hr tablet; Take 1 tablet (60 mg total) by mouth daily.  Dispense: 30 tablet; Refill: 3  3. Coronary artery disease involving native coronary artery of native heart without angina pectoris Severe two-vessel coronary artery disease Medical management as per cardiology  4. Peripheral vascular disease ([place]) Status post revascularization procedure in 08/2015 Risk factor modification Follow-up with vascular - clopidogrel (PLAVIX) 75 MG tablet; Take 1 tablet (75 mg total) by mouth daily.  Dispense: 30 tablet; Refill: 3  5. Other diabetic neurological complication associated with type 2 diabetes mellitus (HCC) Controlled - gabapentin (NEURONTIN) 300 MG capsule; Take 1 capsule (300 mg total) by mouth 3 (three) times daily.  Dispense: 90 capsule; Refill: 3 - glipiZIDE (GLUCOTROL) 10 MG tablet; Take 1 tablet (10 mg total) by mouth 2 (two) times daily before a meal.  Dispense: 60 tablet; Refill: 3 - sitaGLIPtin (JANUVIA) 50 MG tablet; Take 1 tablet (50  mg total) by mouth daily.  Dispense: 30 tablet; Refill: 3  6. Screening for breast cancer - MM Digital Screening; Future  7. Screening for colon cancer - Ambulatory referral to Gastroenterology  8. CKD (chronic kidney disease), stage III ([place]) Secondary to hypertensive and diabetic nephropathy Recently seen by nephrology Continue Lasix   Meds ordered this encounter  Medications  . carvedilol (COREG) 25 MG tablet    Sig: Take 1 tablet (25 mg total) by mouth 2 (two) times daily.    Dispense:  60 tablet    Refill:  3    Discontinue previous dose  . amLODipine (NORVASC) 10 MG tablet    Sig: Take 1 tablet (10 mg total) by mouth daily. Reported on 07/13/2015    Dispense:  30 tablet  Refill:  3  . atorvastatin (LIPITOR) 80 MG tablet    Sig: Take 1 tablet (80 mg total) by mouth daily.    Dispense:  30 tablet    Refill:  3  . clopidogrel (PLAVIX) 75 MG tablet    Sig: Take 1 tablet (75 mg total) by mouth daily.    Dispense:  30 tablet    Refill:  3  . gabapentin (NEURONTIN) 300 MG capsule    Sig: Take 1 capsule (300 mg total) by mouth 3 (three) times daily.    Dispense:  90 capsule    Refill:  3    To replace tablets  . glipiZIDE (GLUCOTROL) 10 MG tablet    Sig: Take 1 tablet (10 mg total) by mouth 2 (two) times daily before a meal.    Dispense:  60 tablet    Refill:  3  . hydrALAZINE (APRESOLINE) 100 MG tablet    Sig: Take 1 tablet (100 mg total) by mouth 2 (two) times daily.    Dispense:  60 tablet    Refill:  3  . isosorbide mononitrate (IMDUR) 60 MG 24 hr tablet    Sig: Take 1 tablet (60 mg total) by mouth daily.    Dispense:  30 tablet    Refill:  3  . DISCONTD: sitaGLIPtin (JANUVIA) 50 MG tablet    Sig: Take 1 tablet (50 mg total) by mouth daily.    Dispense:  30 tablet    Refill:  3  . sitaGLIPtin (JANUVIA) 50 MG tablet    Sig: Take 1 tablet (50 mg total) by mouth daily.    Dispense:  30 tablet    Refill:  3    Follow-up: Return in about 3 months (around  03/31/2017) for Follow-up on diabetes mellitus.   Arnoldo Morale MD

## 2017-01-01 ENCOUNTER — Other Ambulatory Visit: Payer: Self-pay

## 2017-01-01 ENCOUNTER — Telehealth: Payer: Self-pay | Admitting: Emergency Medicine

## 2017-01-01 ENCOUNTER — Inpatient Hospital Stay (HOSPITAL_BASED_OUTPATIENT_CLINIC_OR_DEPARTMENT_OTHER)
Admission: EM | Admit: 2017-01-01 | Discharge: 2017-01-06 | DRG: 280 | Disposition: A | Discharge status: Home / Self Care | Payer: Medicaid Other | Attending: Internal Medicine | Admitting: Internal Medicine

## 2017-01-01 ENCOUNTER — Encounter (HOSPITAL_BASED_OUTPATIENT_CLINIC_OR_DEPARTMENT_OTHER): Payer: Self-pay | Admitting: Emergency Medicine

## 2017-01-01 ENCOUNTER — Emergency Department (HOSPITAL_BASED_OUTPATIENT_CLINIC_OR_DEPARTMENT_OTHER): Payer: Medicaid Other

## 2017-01-01 DIAGNOSIS — F17210 Nicotine dependence, cigarettes, uncomplicated: Secondary | ICD-10-CM | POA: Diagnosis present

## 2017-01-01 DIAGNOSIS — I25119 Atherosclerotic heart disease of native coronary artery with unspecified angina pectoris: Secondary | ICD-10-CM

## 2017-01-01 DIAGNOSIS — I130 Hypertensive heart and chronic kidney disease with heart failure and stage 1 through stage 4 chronic kidney disease, or unspecified chronic kidney disease: Secondary | ICD-10-CM | POA: Diagnosis present

## 2017-01-01 DIAGNOSIS — R079 Chest pain, unspecified: Secondary | ICD-10-CM | POA: Diagnosis present

## 2017-01-01 DIAGNOSIS — E1151 Type 2 diabetes mellitus with diabetic peripheral angiopathy without gangrene: Secondary | ICD-10-CM | POA: Diagnosis present

## 2017-01-01 DIAGNOSIS — I2510 Atherosclerotic heart disease of native coronary artery without angina pectoris: Secondary | ICD-10-CM | POA: Diagnosis present

## 2017-01-01 DIAGNOSIS — Z7984 Long term (current) use of oral hypoglycemic drugs: Secondary | ICD-10-CM | POA: Diagnosis not present

## 2017-01-01 DIAGNOSIS — E669 Obesity, unspecified: Secondary | ICD-10-CM | POA: Diagnosis present

## 2017-01-01 DIAGNOSIS — I214 Non-ST elevation (NSTEMI) myocardial infarction: Secondary | ICD-10-CM

## 2017-01-01 DIAGNOSIS — J101 Influenza due to other identified influenza virus with other respiratory manifestations: Secondary | ICD-10-CM

## 2017-01-01 DIAGNOSIS — E1122 Type 2 diabetes mellitus with diabetic chronic kidney disease: Secondary | ICD-10-CM | POA: Diagnosis present

## 2017-01-01 DIAGNOSIS — E785 Hyperlipidemia, unspecified: Secondary | ICD-10-CM | POA: Diagnosis present

## 2017-01-01 DIAGNOSIS — Z8249 Family history of ischemic heart disease and other diseases of the circulatory system: Secondary | ICD-10-CM

## 2017-01-01 DIAGNOSIS — N183 Chronic kidney disease, stage 3 (moderate): Secondary | ICD-10-CM | POA: Diagnosis present

## 2017-01-01 DIAGNOSIS — E7800 Pure hypercholesterolemia, unspecified: Secondary | ICD-10-CM | POA: Diagnosis present

## 2017-01-01 DIAGNOSIS — Z833 Family history of diabetes mellitus: Secondary | ICD-10-CM

## 2017-01-01 DIAGNOSIS — J181 Lobar pneumonia, unspecified organism: Secondary | ICD-10-CM

## 2017-01-01 DIAGNOSIS — J9601 Acute respiratory failure with hypoxia: Secondary | ICD-10-CM | POA: Diagnosis present

## 2017-01-01 DIAGNOSIS — Z23 Encounter for immunization: Secondary | ICD-10-CM

## 2017-01-01 DIAGNOSIS — Z89432 Acquired absence of left foot: Secondary | ICD-10-CM | POA: Diagnosis not present

## 2017-01-01 DIAGNOSIS — Z7982 Long term (current) use of aspirin: Secondary | ICD-10-CM

## 2017-01-01 DIAGNOSIS — J189 Pneumonia, unspecified organism: Secondary | ICD-10-CM

## 2017-01-01 DIAGNOSIS — N179 Acute kidney failure, unspecified: Secondary | ICD-10-CM

## 2017-01-01 DIAGNOSIS — Z7902 Long term (current) use of antithrombotics/antiplatelets: Secondary | ICD-10-CM

## 2017-01-01 DIAGNOSIS — R0602 Shortness of breath: Secondary | ICD-10-CM

## 2017-01-01 DIAGNOSIS — E1140 Type 2 diabetes mellitus with diabetic neuropathy, unspecified: Secondary | ICD-10-CM | POA: Diagnosis present

## 2017-01-01 DIAGNOSIS — I5032 Chronic diastolic (congestive) heart failure: Secondary | ICD-10-CM | POA: Diagnosis present

## 2017-01-01 DIAGNOSIS — R0902 Hypoxemia: Secondary | ICD-10-CM

## 2017-01-01 DIAGNOSIS — E119 Type 2 diabetes mellitus without complications: Secondary | ICD-10-CM

## 2017-01-01 DIAGNOSIS — J1000 Influenza due to other identified influenza virus with unspecified type of pneumonia: Secondary | ICD-10-CM | POA: Diagnosis present

## 2017-01-01 DIAGNOSIS — E876 Hypokalemia: Secondary | ICD-10-CM | POA: Diagnosis present

## 2017-01-01 DIAGNOSIS — Z90710 Acquired absence of both cervix and uterus: Secondary | ICD-10-CM | POA: Diagnosis not present

## 2017-01-01 DIAGNOSIS — I252 Old myocardial infarction: Secondary | ICD-10-CM

## 2017-01-01 DIAGNOSIS — Z6834 Body mass index (BMI) 34.0-34.9, adult: Secondary | ICD-10-CM | POA: Diagnosis not present

## 2017-01-01 DIAGNOSIS — Z955 Presence of coronary angioplasty implant and graft: Secondary | ICD-10-CM

## 2017-01-01 HISTORY — DX: Chest pain, unspecified: R07.9

## 2017-01-01 HISTORY — DX: Non-ST elevation (NSTEMI) myocardial infarction: I21.4

## 2017-01-01 LAB — CBC WITH DIFFERENTIAL/PLATELET
BASOS ABS: 0 10*3/uL (ref 0.0–0.1)
Basophils Relative: 0 %
Eosinophils Absolute: 0 10*3/uL (ref 0.0–0.7)
Eosinophils Relative: 0 %
HEMATOCRIT: 39.1 % (ref 36.0–46.0)
Hemoglobin: 12.9 g/dL (ref 12.0–15.0)
LYMPHS PCT: 8 %
Lymphs Abs: 0.9 10*3/uL (ref 0.7–4.0)
MCH: 25.9 pg — ABNORMAL LOW (ref 26.0–34.0)
MCHC: 33 g/dL (ref 30.0–36.0)
MCV: 78.4 fL (ref 78.0–100.0)
Monocytes Absolute: 1.1 10*3/uL — ABNORMAL HIGH (ref 0.1–1.0)
Monocytes Relative: 9 %
NEUTROS ABS: 9.2 10*3/uL — AB (ref 1.7–7.7)
Neutrophils Relative %: 83 %
PLATELETS: 234 10*3/uL (ref 150–400)
RBC: 4.99 MIL/uL (ref 3.87–5.11)
RDW: 14.9 % (ref 11.5–15.5)
WBC: 11.3 10*3/uL — AB (ref 4.0–10.5)

## 2017-01-01 LAB — COMPREHENSIVE METABOLIC PANEL
ALT: 22 U/L (ref 14–54)
ANION GAP: 12 (ref 5–15)
AST: 33 U/L (ref 15–41)
Albumin: 3.7 g/dL (ref 3.5–5.0)
Alkaline Phosphatase: 105 U/L (ref 38–126)
BILIRUBIN TOTAL: 0.6 mg/dL (ref 0.3–1.2)
BUN: 25 mg/dL — ABNORMAL HIGH (ref 6–20)
CO2: 25 mmol/L (ref 22–32)
Calcium: 9.3 mg/dL (ref 8.9–10.3)
Chloride: 102 mmol/L (ref 101–111)
Creatinine, Ser: 2.1 mg/dL — ABNORMAL HIGH (ref 0.44–1.00)
GFR, EST AFRICAN AMERICAN: 29 mL/min — AB (ref >60)
GFR, EST NON AFRICAN AMERICAN: 25 mL/min — AB (ref >60)
Glucose, Bld: 168 mg/dL — ABNORMAL HIGH (ref 65–99)
POTASSIUM: 3.1 mmol/L — AB (ref 3.5–5.1)
Sodium: 139 mmol/L (ref 135–145)
TOTAL PROTEIN: 7.8 g/dL (ref 6.5–8.1)

## 2017-01-01 LAB — CBG MONITORING, ED: GLUCOSE-CAPILLARY: 141 mg/dL — AB (ref 65–99)

## 2017-01-01 LAB — TROPONIN I
TROPONIN I: 4.26 ng/mL — AB (ref <0.03)
Troponin I: 2.04 ng/mL (ref <0.03)
Troponin I: 4.9 ng/mL (ref <0.03)

## 2017-01-01 LAB — BRAIN NATRIURETIC PEPTIDE: B NATRIURETIC PEPTIDE 5: 962.9 pg/mL — AB (ref 0.0–100.0)

## 2017-01-01 LAB — INFLUENZA PANEL BY PCR (TYPE A & B)
Influenza A By PCR: POSITIVE — AB
Influenza B By PCR: NEGATIVE

## 2017-01-01 LAB — HEPARIN LEVEL (UNFRACTIONATED): HEPARIN UNFRACTIONATED: 0.5 [IU]/mL (ref 0.30–0.70)

## 2017-01-01 LAB — MRSA PCR SCREENING: MRSA BY PCR: NEGATIVE

## 2017-01-01 LAB — I-STAT CG4 LACTIC ACID, ED: Lactic Acid, Venous: 1.66 mmol/L (ref 0.5–1.9)

## 2017-01-01 MED ORDER — IPRATROPIUM-ALBUTEROL 0.5-2.5 (3) MG/3ML IN SOLN
3.0000 mL | Freq: Three times a day (TID) | RESPIRATORY_TRACT | Status: DC
  Administered 2017-01-02: 3 mL via RESPIRATORY_TRACT
  Filled 2017-01-01: qty 3

## 2017-01-01 MED ORDER — IPRATROPIUM-ALBUTEROL 0.5-2.5 (3) MG/3ML IN SOLN: 3.0000 mL | Freq: Three times a day (TID) | RESPIRATORY_TRACT | Status: DC

## 2017-01-01 MED ORDER — PIPERACILLIN-TAZOBACTAM 3.375 G IVPB 30 MIN
3.3750 g | Freq: Once | INTRAVENOUS | Status: AC
  Administered 2017-01-01: 3.375 g via INTRAVENOUS
  Filled 2017-01-01 (×2): qty 50

## 2017-01-01 MED ORDER — LORAZEPAM 2 MG/ML IJ SOLN
0.5000 mg | Freq: Once | INTRAMUSCULAR | Status: AC
  Administered 2017-01-01: 0.5 mg via INTRAVENOUS
  Filled 2017-01-01: qty 1

## 2017-01-01 MED ORDER — ASPIRIN 81 MG PO CHEW
324.0000 mg | CHEWABLE_TABLET | Freq: Once | ORAL | Status: AC
  Administered 2017-01-01: 324 mg via ORAL
  Filled 2017-01-01: qty 4

## 2017-01-01 MED ORDER — SODIUM CHLORIDE 0.9 % IV SOLN
2000.0000 mg | Freq: Once | INTRAVENOUS | Status: AC
  Administered 2017-01-01: 2000 mg via INTRAVENOUS
  Filled 2017-01-01: qty 2000

## 2017-01-01 MED ORDER — GUAIFENESIN ER 600 MG PO TB12
600.0000 mg | ORAL_TABLET | Freq: Two times a day (BID) | ORAL | Status: DC
  Administered 2017-01-01 – 2017-01-06 (×10): 600 mg via ORAL
  Filled 2017-01-01 (×10): qty 1

## 2017-01-01 MED ORDER — OSELTAMIVIR PHOSPHATE 30 MG PO CAPS
30.0000 mg | ORAL_CAPSULE | Freq: Once | ORAL | Status: DC
  Filled 2017-01-01: qty 1

## 2017-01-01 MED ORDER — OSELTAMIVIR PHOSPHATE 30 MG PO CAPS
30.0000 mg | ORAL_CAPSULE | Freq: Two times a day (BID) | ORAL | Status: DC
  Administered 2017-01-01 – 2017-01-02 (×2): 30 mg via ORAL
  Filled 2017-01-01 (×3): qty 1

## 2017-01-01 MED ORDER — HEPARIN (PORCINE) IN NACL 100-0.45 UNIT/ML-% IJ SOLN
1000.0000 [IU]/h | INTRAMUSCULAR | Status: DC
  Administered 2017-01-01 – 2017-01-02 (×2): 1000 [IU]/h via INTRAVENOUS
  Filled 2017-01-01 (×2): qty 250

## 2017-01-01 MED ORDER — POTASSIUM CHLORIDE CRYS ER 20 MEQ PO TBCR
40.0000 meq | EXTENDED_RELEASE_TABLET | Freq: Once | ORAL | Status: AC
  Administered 2017-01-01: 40 meq via ORAL
  Filled 2017-01-01: qty 2

## 2017-01-01 MED ORDER — ATORVASTATIN CALCIUM 80 MG PO TABS
80.0000 mg | ORAL_TABLET | Freq: Every day | ORAL | Status: DC
  Administered 2017-01-01 – 2017-01-06 (×6): 80 mg via ORAL
  Filled 2017-01-01 (×6): qty 1

## 2017-01-01 MED ORDER — ACETAMINOPHEN 325 MG PO TABS
650.0000 mg | ORAL_TABLET | Freq: Once | ORAL | Status: AC
  Administered 2017-01-01: 650 mg via ORAL
  Filled 2017-01-01: qty 2

## 2017-01-01 MED ORDER — GABAPENTIN 300 MG PO CAPS
300.0000 mg | ORAL_CAPSULE | Freq: Three times a day (TID) | ORAL | Status: DC
  Administered 2017-01-01 – 2017-01-06 (×14): 300 mg via ORAL
  Filled 2017-01-01 (×14): qty 1

## 2017-01-01 MED ORDER — ISOSORBIDE MONONITRATE ER 60 MG PO TB24
60.0000 mg | ORAL_TABLET | Freq: Every day | ORAL | Status: DC
  Filled 2017-01-01: qty 1

## 2017-01-01 MED ORDER — CLOPIDOGREL BISULFATE 75 MG PO TABS
75.0000 mg | ORAL_TABLET | Freq: Every day | ORAL | Status: DC
  Administered 2017-01-01 – 2017-01-06 (×6): 75 mg via ORAL
  Filled 2017-01-01 (×6): qty 1

## 2017-01-01 MED ORDER — ISOSORBIDE MONONITRATE ER 60 MG PO TB24
60.0000 mg | ORAL_TABLET | Freq: Every day | ORAL | Status: DC
  Administered 2017-01-01 – 2017-01-06 (×6): 60 mg via ORAL
  Filled 2017-01-01 (×6): qty 1

## 2017-01-01 MED ORDER — CLOPIDOGREL BISULFATE 75 MG PO TABS
75.0000 mg | ORAL_TABLET | Freq: Once | ORAL | Status: AC
  Administered 2017-01-01: 75 mg via ORAL
  Filled 2017-01-01: qty 1

## 2017-01-01 MED ORDER — LABETALOL HCL 5 MG/ML IV SOLN
10.0000 mg | INTRAVENOUS | Status: DC | PRN
Start: 2017-01-01 — End: 2017-01-02
  Administered 2017-01-01: 10 mg via INTRAVENOUS
  Filled 2017-01-01: qty 4

## 2017-01-01 MED ORDER — OSELTAMIVIR PHOSPHATE 75 MG PO CAPS
75.0000 mg | ORAL_CAPSULE | Freq: Once | ORAL | Status: AC
  Administered 2017-01-01: 75 mg via ORAL

## 2017-01-01 MED ORDER — METOPROLOL TARTRATE 5 MG/5ML IV SOLN
2.5000 mg | Freq: Once | INTRAVENOUS | Status: AC
  Administered 2017-01-01: 2.5 mg via INTRAVENOUS
  Filled 2017-01-01: qty 5

## 2017-01-01 MED ORDER — HYDRALAZINE HCL 50 MG PO TABS
100.0000 mg | ORAL_TABLET | Freq: Two times a day (BID) | ORAL | Status: DC
  Administered 2017-01-02: 100 mg via ORAL
  Filled 2017-01-01: qty 2

## 2017-01-01 MED ORDER — VITAMIN D 1000 UNITS PO TABS
1000.0000 [IU] | ORAL_TABLET | Freq: Every day | ORAL | Status: DC
  Administered 2017-01-01 – 2017-01-06 (×6): 1000 [IU] via ORAL
  Filled 2017-01-01 (×8): qty 1

## 2017-01-01 MED ORDER — HEPARIN BOLUS VIA INFUSION
4000.0000 [IU] | Freq: Once | INTRAVENOUS | Status: AC
  Administered 2017-01-01: 4000 [IU] via INTRAVENOUS

## 2017-01-01 MED ORDER — METOPROLOL TARTRATE 5 MG/5ML IV SOLN: 5.0000 mg | Freq: Once | INTRAVENOUS | Status: DC

## 2017-01-01 MED ORDER — TRAMADOL HCL 50 MG PO TABS
50.0000 mg | ORAL_TABLET | Freq: Once | ORAL | Status: AC
  Administered 2017-01-01: 50 mg via ORAL
  Filled 2017-01-01: qty 1

## 2017-01-01 MED ORDER — AMLODIPINE BESYLATE 5 MG PO TABS
10.0000 mg | ORAL_TABLET | Freq: Once | ORAL | Status: AC
  Administered 2017-01-01: 10 mg via ORAL
  Filled 2017-01-01: qty 2

## 2017-01-01 MED ORDER — CARVEDILOL 25 MG PO TABS
25.0000 mg | ORAL_TABLET | Freq: Once | ORAL | Status: DC
Start: 2017-01-01 — End: 2017-01-01
  Filled 2017-01-01: qty 1

## 2017-01-01 MED ORDER — NITROGLYCERIN 0.4 MG SL SUBL: 0.4000 mg | SUBLINGUAL_TABLET | SUBLINGUAL | Status: DC | PRN

## 2017-01-01 MED ORDER — ASPIRIN EC 81 MG PO TBEC
81.0000 mg | DELAYED_RELEASE_TABLET | Freq: Every day | ORAL | Status: DC
  Administered 2017-01-01 – 2017-01-06 (×6): 81 mg via ORAL
  Filled 2017-01-01 (×6): qty 1

## 2017-01-01 MED ORDER — HYDRALAZINE HCL 20 MG/ML IJ SOLN
10.0000 mg | Freq: Once | INTRAMUSCULAR | Status: AC
  Administered 2017-01-01: 10 mg via INTRAVENOUS
  Filled 2017-01-01: qty 1

## 2017-01-01 MED ORDER — OXYMETAZOLINE HCL 0.05 % NA SOLN
1.0000 | Freq: Once | NASAL | Status: DC
  Filled 2017-01-01: qty 15

## 2017-01-01 MED ORDER — CARVEDILOL 25 MG PO TABS
25.0000 mg | ORAL_TABLET | Freq: Two times a day (BID) | ORAL | Status: DC
  Administered 2017-01-01 – 2017-01-06 (×10): 25 mg via ORAL
  Filled 2017-01-01 (×10): qty 1

## 2017-01-01 MED ORDER — MORPHINE SULFATE (PF) 4 MG/ML IV SOLN
4.0000 mg | Freq: Once | INTRAVENOUS | Status: AC
  Administered 2017-01-01: 4 mg via INTRAVENOUS
  Filled 2017-01-01: qty 1

## 2017-01-01 MED ORDER — ONDANSETRON HCL 4 MG/2ML IJ SOLN
4.0000 mg | Freq: Once | INTRAMUSCULAR | Status: AC
  Administered 2017-01-01: 4 mg via INTRAVENOUS
  Filled 2017-01-01: qty 2

## 2017-01-01 MED ORDER — INSULIN ASPART 100 UNIT/ML ~~LOC~~ SOLN
0.0000 [IU] | Freq: Three times a day (TID) | SUBCUTANEOUS | Status: DC
  Administered 2017-01-04: 5 [IU] via SUBCUTANEOUS
  Administered 2017-01-05 – 2017-01-06 (×3): 3 [IU] via SUBCUTANEOUS

## 2017-01-01 MED ORDER — POTASSIUM CHLORIDE 10 MEQ/100ML IV SOLN
10.0000 meq | INTRAVENOUS | Status: AC
  Administered 2017-01-01 (×2): 10 meq via INTRAVENOUS
  Filled 2017-01-01 (×2): qty 100

## 2017-01-01 MED ORDER — VANCOMYCIN HCL 500 MG IV SOLR
INTRAVENOUS | Status: AC
  Filled 2017-01-01: qty 2000

## 2017-01-01 NOTE — ED Notes (Signed)
ED Provider at bedside. 

## 2017-01-01 NOTE — Progress Notes (Signed)
ANTICOAGULATION CONSULT NOTE - Initial Consult  Pharmacy Consult for heparin Indication: chest pain/ACS  No Known Allergies  Patient Measurements: Height: 5\' 4"  (162.6 cm) Weight: 201 lb (91.2 kg) IBW/kg (Calculated) : 54.7 Heparin Dosing Weight: 75 kg   Vital Signs: Temp: 101.7 F (38.7 C) (12/28 0924) Temp Source: Rectal (12/28 0924) BP: 165/77 (12/28 1000) Pulse Rate: 92 (12/28 1000)  Labs: Recent Labs    01/01/17 0917  HGB 12.9  HCT 39.1  PLT 234  CREATININE 2.10*  TROPONINI 2.04*    Estimated Creatinine Clearance: 31.9 mL/min (A) (by C-G formula based on SCr of 2.1 mg/dL (H)).   Medical History: Past Medical History:  Diagnosis Date  . CAD (coronary artery disease)    a. NSTEM 09/2016: cath showing severe diffuse disease of the RCA, ramus and Cx with mild-mod disease of LAD; PCI would require multiple stents and significant contrast usage thus medical therapy recommended.  . Chronic diastolic CHF (congestive heart failure) ([place])   . CKD (chronic kidney disease), stage III ([place])   . Diabetes mellitus with neuropathy ([place])   . Foot amputation status ([place])    a. h/o left foot transmetatarsal amputation  . High cholesterol   . Hypertension    a. patent renal arteries by PV angio 08/2015. b. heavy proteinuria 09/2016 ? nephrotic.  [name] Normocytic anemia   . Obesity   . Proteinuria   . PVD (peripheral vascular disease) ([place])    a. status post left SFA and popliteal stent and left SFA PTA with drug coated balloon in 08/2015.  . Tobacco abuse     Medications:   (Not in a hospital admission)  Assessment: 60 YOF who presents with shortness of breath, nausea and vomiting. Pharmacy consulted to start IV heparin for ACS. Initial troponin is elevated at 2.04. H/H and plt wnl. She is not on any anticoagulation prior to admission   Goal of Therapy:  Heparin level 0.3-0.7 units/ml Monitor platelets by anticoagulation protocol: Yes   Plan:  -Given heparin 4000 units IV  bolus, then heparin drip at 1000 units/hr -F/u 6 hr HL -Monitor daily HL, CBC and s/s of bleeding  Albertina Parr, PharmD., BCPS Clinical Pharmacist Pager [phone]

## 2017-01-01 NOTE — ED Provider Notes (Signed)
[facility]ERGENCY DEPARTMENT Provider Note   CSN: [id number] Arrival date & time: 01/01/17  3664     History   Chief Complaint Chief Complaint  Patient presents with  . Shortness of Breath    HPI Rebekah Johnson is a 56 y.o. female.  HPI   56 year old female with dyspnea.  Reports worsening over the evening.  Intermittent chest tightness.  Patient was admitted 3 months ago with a non-STEMI.  Multivessel CAD which was not amenable to stenting.  Medical treatment was recommended.  She reports compliance with her medications up until yesterday.  She did not take at home starting in the evening because of nausea.  She did not take them again this morning.  She feels like her breathing is progressively worsened.  No new swelling.  No fevers or chills.  No cough.  Past Medical History:  Diagnosis Date  . CAD (coronary artery disease)    a. NSTEM 09/2016: cath showing severe diffuse disease of the RCA, ramus and Cx with mild-mod disease of LAD; PCI would require multiple stents and significant contrast usage thus medical therapy recommended.  . Chronic diastolic CHF (congestive heart failure) ([place])   . CKD (chronic kidney disease), stage III ([place])   . Diabetes mellitus with neuropathy ([place])   . Foot amputation status ([place])    a. h/o left foot transmetatarsal amputation  . High cholesterol   . Hypertension    a. patent renal arteries by PV angio 08/2015. b. heavy proteinuria 09/2016 ? nephrotic.  [name] Normocytic anemia   . Obesity   . Proteinuria   . PVD (peripheral vascular disease) ([place])    a. status post left SFA and popliteal stent and left SFA PTA with drug coated balloon in 08/2015.  . Tobacco abuse     Patient Active Problem List   Diagnosis Date Noted  . Tobacco abuse   . PVD (peripheral vascular disease) ([place])   . Proteinuria   . Obesity   . Normocytic anemia   . Hypertension   . High cholesterol   . Foot amputation status ([place])   . Diabetes mellitus with  neuropathy ([place])   . CKD (chronic kidney disease), stage III ([place])   . Chronic diastolic CHF (congestive heart failure) ([place])   . CAD (coronary artery disease) 10/27/2016  . Chronic kidney disease 09/23/2016  . Acute congestive heart failure ([place])   . Hypertensive heart disease with heart failure ([place])   . Acute diastolic CHF (congestive heart failure) ([place]) 09/14/2016  . Onychomycosis 12/24/2015  . Status post transmetatarsal amputation of foot, left ([place]) 09/06/2015  . Diabetic foot ulcer ([place]) 07/13/2015  . Cellulitis 07/12/2015  . HLD (hyperlipidemia) 09/02/2012  . Peripheral nerve disease 09/02/2012  . Type 2 diabetes mellitus with peripheral angiopathy ([place]) 09/02/2012  . Hypertriglyceridemia 07/28/2012  . Peripheral vascular disease ([place]) 07/28/2012  . Avitaminosis D 07/28/2012  . Essential (primary) hypertension 06/10/2012  . History of biliary T-tube placement 06/10/2012  . Leg pain 06/10/2012  . Current tobacco use 06/10/2012  . Atrophic vaginitis 06/07/2012  . Type 2 diabetes mellitus ([place]) 06/07/2012    Past Surgical History:  Procedure Laterality Date  . ABDOMINAL HYSTERECTOMY    . AMPUTATION Left 09/06/2015   Procedure: LEFT TRANSMETATARSAL AMPUTATTION;  Surgeon: Newt Minion, MD;  Location: [facility];  Service: Orthopedics;  Laterality: Left;  . LEFT HEART CATH AND CORONARY ANGIOGRAPHY N/A 09/17/2016   Procedure: LEFT HEART CATH AND CORONARY ANGIOGRAPHY;  Surgeon: Jettie Booze,  MD;  Location: [facility];  Service: Cardiovascular;  Laterality: N/A;  . PERIPHERAL VASCULAR CATHETERIZATION N/A 08/16/2015   Procedure: Abdominal Aortogram;  Surgeon: Elam Dutch, MD;  Location: [facility];  Service: Cardiovascular;  Laterality: N/A;  . PERIPHERAL VASCULAR CATHETERIZATION Bilateral 08/16/2015   Procedure: Lower Extremity Angiography;  Surgeon: Elam Dutch, MD;  Location: [facility];  Service: Cardiovascular;  Laterality: Bilateral;  .  PERIPHERAL VASCULAR CATHETERIZATION Left 08/16/2015   Procedure: Peripheral Vascular Intervention;  Surgeon: Elam Dutch, MD;  Location: [facility];  Service: Cardiovascular;  Laterality: Left;  SFA STENT X 2    OB History    No data available       Home Medications    Prior to Admission medications   Medication Sig Start Date End Date Taking? Authorizing Provider  amLODipine (NORVASC) 10 MG tablet Take 1 tablet (10 mg total) by mouth daily. Reported on 07/13/2015 12/31/16 12/31/17  Arnoldo Morale, MD  aspirin 81 MG EC tablet Take 1 tablet (81 mg total) by mouth daily. Reported on 07/13/2015 07/02/16   Arnoldo Morale, MD  atorvastatin (LIPITOR) 80 MG tablet Take 1 tablet (80 mg total) by mouth daily. 12/31/16   Arnoldo Morale, MD  blood glucose meter kit and supplies KIT Dispense based on patient and insurance preference. Use up to four times daily as directed. (FOR ICD-9 250.00, 250.01). Patient not taking: Reported on 12/31/2016 07/25/15   Ward, Delice Bison, DO  Blood Glucose Monitoring Suppl (ACCU-CHEK AVIVA) device Use as instructed three times daily. Patient not taking: Reported on 12/31/2016 04/22/16   Arnoldo Morale, MD  carvedilol (COREG) 25 MG tablet Take 1 tablet (25 mg total) by mouth 2 (two) times daily. 12/31/16 12/31/17  Arnoldo Morale, MD  Cholecalciferol (VITAMIN D-3) 1000 units CAPS Take 1,000 Units by mouth daily.     [provider]  clopidogrel (PLAVIX) 75 MG tablet Take 1 tablet (75 mg total) by mouth daily. 12/31/16   Arnoldo Morale, MD  docusate sodium (COLACE) 100 MG capsule TAKE ONE CAPSULE BY MOUTH TWICE A DAY 10/26/16   Arnoldo Morale, MD  gabapentin (NEURONTIN) 300 MG capsule Take 1 capsule (300 mg total) by mouth 3 (three) times daily. 12/31/16   Arnoldo Morale, MD  glipiZIDE (GLUCOTROL) 10 MG tablet Take 1 tablet (10 mg total) by mouth 2 (two) times daily before a meal. 12/31/16   Arnoldo Morale, MD  glucose blood (ACCU-CHEK AVIVA) test strip Use as  instructed three times daily before meals. Patient not taking: Reported on 12/31/2016 04/22/16   Arnoldo Morale, MD  hydrALAZINE (APRESOLINE) 100 MG tablet Take 1 tablet (100 mg total) by mouth 2 (two) times daily. 12/31/16   Arnoldo Morale, MD  isosorbide mononitrate (IMDUR) 60 MG 24 hr tablet Take 1 tablet (60 mg total) by mouth daily. 12/31/16   Arnoldo Morale, MD  Lancet Devices [facility]) lancets Use as instructed three times daily before meals. Patient not taking: Reported on 12/31/2016 04/22/16   Arnoldo Morale, MD  nitroGLYCERIN (NITROSTAT) 0.4 MG SL tablet Place 1 tablet (0.4 mg total) under the tongue every 5 (five) minutes as needed for chest pain. Patient not taking: Reported on 12/31/2016 10/26/16 01/24/17  Charlie Pitter, PA-C  Omega-3 Fatty Acids (FISH OIL) 1000 MG CAPS Take 1,000 mg by mouth daily. Reported on 07/13/2015    [provider]  sitaGLIPtin (JANUVIA) 50 MG tablet Take 1 tablet (50 mg total) by mouth daily. 12/31/16  Arnoldo Morale, MD    Family History Family History  Problem Relation Age of Onset  . Diabetes Mother   . Hypertension Father     Social History Social History   Tobacco Use  . Smoking status: Current Every Day Smoker    Packs/day: 0.25    Types: Cigarettes  . Smokeless tobacco: Never Used  . Tobacco comment: Down to 3 cigarettes per day  Substance Use Topics  . Alcohol use: Yes    Alcohol/week: 0.6 - 1.2 oz    Types: 1 - 2 Glasses of wine per week    Comment: on social occassions  . Drug use: No     Allergies   Patient has no known allergies.   Review of Systems Review of Systems  All systems reviewed and negative, other than as noted in HPI.  Physical Exam Updated Vital Signs BP (!) 186/100   Pulse (!) 103   Resp 17   Ht '5\' 4"'  (1.626 m)   Wt 91.2 kg (201 lb)   SpO2 95%   BMI 34.50 kg/m   Physical Exam  Constitutional: She appears distressed.  Laying in bed. Moaning/groaning. Appears uncomfortable.   HENT:    Head: Normocephalic and atraumatic.  Eyes: Conjunctivae are normal. Right eye exhibits no discharge. Left eye exhibits no discharge.  Neck: Neck supple.  Cardiovascular: Regular rhythm and normal heart sounds. Exam reveals no gallop and no friction rub.  No murmur heard. Mild tachycardia  Pulmonary/Chest: Breath sounds normal.  Tachypnea.   Abdominal: Soft. She exhibits no distension. There is no tenderness.  Musculoskeletal: She exhibits no edema or tenderness.  No LE edema. Partial L foot amp.   Neurological: She is alert.  Skin: Skin is warm and dry.  Psychiatric: She has a normal mood and affect. Her behavior is normal. Thought content normal.  Nursing note and vitals reviewed.    ED Treatments / Results  Labs (all labs ordered are listed, but only abnormal results are displayed) Labs Reviewed  COMPREHENSIVE METABOLIC PANEL - Abnormal; Notable for the following components:      Result Value   Potassium 3.1 (*)    Glucose, Bld 168 (*)    BUN 25 (*)    Creatinine, Ser 2.10 (*)    GFR calc non Af Amer 25 (*)    GFR calc Af Amer 29 (*)    All other components within normal limits  CBC WITH DIFFERENTIAL/PLATELET - Abnormal; Notable for the following components:   WBC 11.3 (*)    MCH 25.9 (*)    Neutro Abs 9.2 (*)    Monocytes Absolute 1.1 (*)    All other components within normal limits  TROPONIN I - Abnormal; Notable for the following components:   Troponin I 2.04 (*)    All other components within normal limits  BRAIN NATRIURETIC PEPTIDE - Abnormal; Notable for the following components:   B Natriuretic Peptide 962.9 (*)    All other components within normal limits  CULTURE, BLOOD (ROUTINE X 2)  CULTURE, BLOOD (ROUTINE X 2)  INFLUENZA PANEL BY PCR (TYPE A & B)  HEPARIN LEVEL (UNFRACTIONATED)  I-STAT CG4 LACTIC ACID, ED    EKG  EKG Interpretation  Date/Time:  Friday January 01 2017 09:07:58 EST Ventricular Rate:  102 PR Interval:    QRS Duration: 92 QT  Interval:  326 QTC Calculation: 425 R Axis:   53 Text Interpretation:  Sinus tachycardia Repol abnrm, severe global ischemia (LM/MVD) Confirmed by Virgel Manifold [phone])  on 01/01/2017 9:30:24 AM       Radiology Dg Chest Portable 1 View  Result Date: 01/01/2017 CLINICAL DATA:  Patient reports shortness of breath and central to left chest pain since last night. Reports nausea and 2 episodes of vomiting. Tobacco abuse, DM, renal disease, heart cath, hysterectomy EXAM: PORTABLE CHEST 1 VIEW COMPARISON:  09/15/2016 FINDINGS: Heart size is normal. Indistinct opacity in the right mid lung zone measures approximately 4.0 cm. There is mild prominence of interstitial markings diffusely. No evidence for pulmonary edema. No pleural effusions. IMPRESSION: 1. 4 cm right mid lung zone opacity is suspicious for mass but could also represent an infectious infiltrate. Close follow-up is needed to exclude malignancy. 2. Followup PA and lateral chest X-ray is recommended in 3-4 weeks following trial of antibiotic therapy to ensure resolution and exclude underlying malignancy. Electronically Signed   By: Nolon Nations M.D.   On: 01/01/2017 09:40    Procedures Procedures (including critical care time)  CRITICAL CARE Performed by: Virgel Manifold Total critical care time: 35 minutes Critical care time was exclusive of separately billable procedures and treating other patients. Critical care was necessary to treat or prevent imminent or life-threatening deterioration. Critical care was time spent personally by me on the following activities: development of treatment plan with patient and/or surrogate as well as nursing, discussions with consultants, evaluation of patient's response to treatment, examination of patient, obtaining history from patient or surrogate, ordering and performing treatments and interventions, ordering and review of laboratory studies, ordering and review of radiographic studies, pulse  oximetry and re-evaluation of patient's condition.   Medications Ordered in ED Medications - No data to display   Initial Impression / Assessment and Plan / ED Course  I have reviewed the triage vital signs and the nursing notes.  Pertinent labs & imaging results that were available during my care of the patient were reviewed by me and considered in my medical decision making (see chart for details).     56 year old female with worsening dyspnea since yesterday.  She is moaning and groaning in the bed.  She cannot clearly tell me if she is currently having chest pain though.  She states that she does "not feel well" and "my breathing."  Noted to be febrile.    Hx of recent NSTEMI with multi-vessel CAD not readily amenable to intervention.  EKG with new changes inferiorly. Very hypertensive.  Home meds ordered. Don't have coreg stocked at [facility]. Metoprolol ordered instead. She doesn't appear to be overtly volume overloaded on exam.  Noted to be febrile. Consider pneumonia. Will check influenza.   9:45 AM R sided opacity. Possibly pneumonia. I don't clearly appreciate on most recent film from 09/15/16.  Pt still appears uncomfortable with tachypnea. o2 sats remain >90% but will trial bipap to see is she is any more comfortable with it.   10:14 AM Troponin is significantly elevated. Known multivessel CAD. Ischemia may be increased demand by possible pneumonia. Heparin was ordered. Will discuss with cardiology. In this setting (multi-system complexity), anticipate hospitalist admission at [facility].   Final Clinical Impressions(s) / ED Diagnoses   Final diagnoses:  Community acquired pneumonia of right lung, unspecified part of lung  NSTEMI (non-ST elevated myocardial infarction) [facility])    ED Discharge Orders    None       Virgel Manifold, MD 01/01/17 1027

## 2017-01-01 NOTE — ED Notes (Signed)
PER EDP-hold lopressor and reevaluate BP at 1045.

## 2017-01-01 NOTE — ED Notes (Signed)
Removed from [place]. VS WNL. SpO2 99%

## 2017-01-01 NOTE — Consult Note (Signed)
Cardiology Consult    Patient ID: Rebekah Johnson; [id number]; 1960-02-10   Admit date: 01/01/2017 Date of Consult: 01/01/2017  Primary Care Provider: Arnoldo Morale, MD Primary Cardiologist: Dr. [name]  Patient Profile    Rebekah Johnson is a 56 y.o. female with past medical history of PVD (s/p left SFA and popliteal stenting in 08/2015), HTN, HLD, Type 2 DM, and Stage 3 CKD who is being seen today for the evaluation of an NSTEMI at the request of Dr. Nevada Crane.   History of Present Illness    Rebekah Johnson was last examined by Robbie Lis, PA-C in 10/2016 for hospital follow-up for an admission of acute hypoxic respiratory failure. Troponin values peaked at 2.00 that admission and she underwent a cardiac catheterization on 09/17/2016 which showed severe disease of the RCA (diffuse 80% stenosis), 80% RI2 stenosis, and 100% stenosis of LCx. Due to the fact that PCI would require multiple stents and significant contrast use, medical management was recommended. At the time of follow-up, she reported her respiratory status was improving and denied any recurrent chest pain, therefore continued medical management was recommended.   She presented to [facility] on 01/01/2017 reporting worsening dyspnea and chest discomfort. Reports these symptoms started yesterday and she was experiencing dyspnea on exertion and at rest. Noted worsening chest pain with coughing and deep inspiration. Had associated nausea along with chills.  While in the ED, she was noted to be febrile and CXR was concerning for PNA. Labs show WBC of 11.3, Hgb 12.9, platelets 234, Na+ 139, K+ 3.1, creatinine 2.10 (baseline 1.6 - 1.7). BNP 962. Initial  Troponin 2.04 with repeat value of 4.90. Positive for Influenza A. EKG shows sinus tachycardia, HR 102, with ST elevation along AVR and LVH with repol abnormality (similar to prior tracings).   She was transferred to [facility] for further management. She has been started on  Tamiflu along with Vancomycin for treatment of Influenza and PNA. Cardiology was asked to evaluate for elevated troponin.   Past Medical History:  Diagnosis Date  . CAD (coronary artery disease)    a. NSTEM 09/2016: cath showing severe diffuse disease of the RCA, ramus and Cx with mild-mod disease of LAD; PCI would require multiple stents and significant contrast usage thus medical therapy recommended.  . Chronic diastolic CHF (congestive heart failure) ([place])   . CKD (chronic kidney disease), stage III ([place])   . Diabetes mellitus with neuropathy ([place])   . Foot amputation status ([place])    a. h/o left foot transmetatarsal amputation  . High cholesterol   . Hypertension    a. patent renal arteries by PV angio 08/2015. b. heavy proteinuria 09/2016 ? nephrotic.  [name] Normocytic anemia   . Obesity   . Proteinuria   . PVD (peripheral vascular disease) ([place])    a. status post left SFA and popliteal stent and left SFA PTA with drug coated balloon in 08/2015.  . Tobacco abuse     Past Surgical History:  Procedure Laterality Date  . ABDOMINAL HYSTERECTOMY    . AMPUTATION Left 09/06/2015   Procedure: LEFT TRANSMETATARSAL AMPUTATTION;  Surgeon: Newt Minion, MD;  Location: [facility];  Service: Orthopedics;  Laterality: Left;  . LEFT HEART CATH AND CORONARY ANGIOGRAPHY N/A 09/17/2016   Procedure: LEFT HEART CATH AND CORONARY ANGIOGRAPHY;  Surgeon: Jettie Booze, MD;  Location: [facility];  Service: Cardiovascular;  Laterality: N/A;  . PERIPHERAL VASCULAR CATHETERIZATION N/A 08/16/2015   Procedure: Abdominal Aortogram;  Surgeon: Elam Dutch, MD;  Location: [facility];  Service: Cardiovascular;  Laterality: N/A;  . PERIPHERAL VASCULAR CATHETERIZATION Bilateral 08/16/2015   Procedure: Lower Extremity Angiography;  Surgeon: Elam Dutch, MD;  Location: [facility];  Service: Cardiovascular;  Laterality: Bilateral;  . PERIPHERAL VASCULAR CATHETERIZATION Left 08/16/2015    Procedure: Peripheral Vascular Intervention;  Surgeon: Elam Dutch, MD;  Location: [facility];  Service: Cardiovascular;  Laterality: Left;  SFA STENT X 2     Home Medications:  Prior to Admission medications   Medication Sig Start Date End Date Taking? Authorizing Provider  amLODipine (NORVASC) 10 MG tablet Take 1 tablet (10 mg total) by mouth daily. Reported on 07/13/2015 12/31/16 12/31/17  Arnoldo Morale, MD  aspirin 81 MG EC tablet Take 1 tablet (81 mg total) by mouth daily. Reported on 07/13/2015 07/02/16   Arnoldo Morale, MD  atorvastatin (LIPITOR) 80 MG tablet Take 1 tablet (80 mg total) by mouth daily. 12/31/16   Arnoldo Morale, MD  blood glucose meter kit and supplies KIT Dispense based on patient and insurance preference. Use up to four times daily as directed. (FOR ICD-9 250.00, 250.01). Patient not taking: Reported on 12/31/2016 07/25/15   Ward, Delice Bison, DO  Blood Glucose Monitoring Suppl (ACCU-CHEK AVIVA) device Use as instructed three times daily. Patient not taking: Reported on 12/31/2016 04/22/16   Arnoldo Morale, MD  carvedilol (COREG) 25 MG tablet Take 1 tablet (25 mg total) by mouth 2 (two) times daily. 12/31/16 12/31/17  Arnoldo Morale, MD  Cholecalciferol (VITAMIN D-3) 1000 units CAPS Take 1,000 Units by mouth daily.     [provider]  clopidogrel (PLAVIX) 75 MG tablet Take 1 tablet (75 mg total) by mouth daily. 12/31/16   Arnoldo Morale, MD  docusate sodium (COLACE) 100 MG capsule TAKE ONE CAPSULE BY MOUTH TWICE A DAY 10/26/16   Arnoldo Morale, MD  gabapentin (NEURONTIN) 300 MG capsule Take 1 capsule (300 mg total) by mouth 3 (three) times daily. 12/31/16   Arnoldo Morale, MD  glipiZIDE (GLUCOTROL) 10 MG tablet Take 1 tablet (10 mg total) by mouth 2 (two) times daily before a meal. 12/31/16   Arnoldo Morale, MD  glucose blood (ACCU-CHEK AVIVA) test strip Use as instructed three times daily before meals. Patient not taking: Reported on 12/31/2016 04/22/16   Arnoldo Morale, MD  hydrALAZINE (APRESOLINE) 100 MG tablet Take 1 tablet (100 mg total) by mouth 2 (two) times daily. 12/31/16   Arnoldo Morale, MD  isosorbide mononitrate (IMDUR) 60 MG 24 hr tablet Take 1 tablet (60 mg total) by mouth daily. 12/31/16   Arnoldo Morale, MD  Lancet Devices [facility]) lancets Use as instructed three times daily before meals. Patient not taking: Reported on 12/31/2016 04/22/16   Arnoldo Morale, MD  nitroGLYCERIN (NITROSTAT) 0.4 MG SL tablet Place 1 tablet (0.4 mg total) under the tongue every 5 (five) minutes as needed for chest pain. Patient not taking: Reported on 12/31/2016 10/26/16 01/24/17  Charlie Pitter, PA-C  Omega-3 Fatty Acids (FISH OIL) 1000 MG CAPS Take 1,000 mg by mouth daily. Reported on 07/13/2015    [provider]  sitaGLIPtin (JANUVIA) 50 MG tablet Take 1 tablet (50 mg total) by mouth daily. 12/31/16   Arnoldo Morale, MD    Inpatient Medications: Scheduled Meds: . oseltamivir  30 mg Oral BID  . oseltamivir  75 mg Oral Once  . oxymetazoline  1 spray Each Nare Once  . vancomycin  Continuous Infusions: . heparin 1,000 Units/hr (01/01/17 1035)   PRN Meds:   Allergies:   No Known Allergies  Social History:   Social History   Socioeconomic History  . Marital status: Married    Spouse name: Not on file  . Number of children: Not on file  . Years of education: Not on file  . Highest education level: Not on file  Social Needs  . Financial resource strain: Not on file  . Food insecurity - worry: Not on file  . Food insecurity - inability: Not on file  . Transportation needs - medical: Not on file  . Transportation needs - non-medical: Not on file  Occupational History  . Not on file  Tobacco Use  . Smoking status: Current Every Day Smoker    Packs/day: 0.25    Types: Cigarettes  . Smokeless tobacco: Never Used  . Tobacco comment: Down to 3 cigarettes per day  Substance and Sexual Activity  . Alcohol use: Yes     Alcohol/week: 0.6 - 1.2 oz    Types: 1 - 2 Glasses of wine per week    Comment: on social occassions  . Drug use: No  . Sexual activity: Yes    Partners: Male  Other Topics Concern  . Not on file  Social History Narrative  . Not on file     Family History:    Family History  Problem Relation Age of Onset  . Diabetes Mother   . Hypertension Father       Review of Systems    General:  No night sweats or weight changes. Positive for fever and chills.  Cardiovascular:  No edema, orthopnea, palpitations, paroxysmal nocturnal dyspnea. Positive for chest pain and dyspnea.  Dermatological: No rash, lesions/masses Respiratory: No cough, Positive for dyspnea. Urologic: No hematuria, dysuria Abdominal:   No nausea, vomiting, diarrhea, bright red blood per rectum, melena, or hematemesis Neurologic:  No visual changes, wkns, changes in mental status. All other systems reviewed and are otherwise negative except as noted above.  Physical Exam/Data    Vitals:   01/01/17 1419 01/01/17 1453 01/01/17 1634 01/01/17 1635  BP:  (!) 162/84 (!) 174/83   Pulse:  80 83   Resp:  18 (!) 21   Temp: 100.1 F (37.8 C)   99.9 F (37.7 C)  TempSrc: Rectal   Axillary  SpO2:  100% 100%   Weight:    202 lb 13.2 oz (92 kg)  Height:    '5\' 4"'  (1.626 m)    Intake/Output Summary (Last 24 hours) at 01/01/2017 1739 Last data filed at 01/01/2017 1515 Gross per 24 hour  Intake 750 ml  Output -  Net 750 ml   Filed Weights   01/01/17 0909 01/01/17 1635  Weight: 201 lb (91.2 kg) 202 lb 13.2 oz (92 kg)   Body mass index is 34.81 kg/m.   General: Pleasant African American female currently on BiPAP. Appears older than stated age.  Psych: Normal affect. Neuro: Alert and oriented X 3. Moves all extremities spontaneously. HEENT: Normal  Neck: Supple without bruits. JVD at 9cm. Lungs:  Resp regular and unlabored, decreased breath sounds along bases bilaterally. Heart: RRR no s3, s4, or  murmurs. Abdomen: Soft, non-tender, non-distended, BS + x 4.  Extremities: No clubbing, cyanosis or edema. DP/PT/Radials 2+ and equal bilaterally.   EKG:  The EKG was personally reviewed and demonstrates:  Sinus tachycardia, HR 102, with ST elevation along AVR and LVH with repol abnormality (similar  to prior tracings).    Labs/Studies     Relevant CV Studies:  Cardiac Catheterization: 09/17/2016  Ost 1st Diag lesion, 25 %stenosed.  Ost 2nd Diag lesion, 25 %stenosed.  Ramus-1 lesion, 50 %stenosed.  Ramus-2 lesion, 80 %stenosed.  Prox Cx to Mid Cx lesion, 100 %stenosed.  Prox RCA to Mid RCA lesion, 80 %stenosed.  RPDA lesion, 80 %stenosed.  Dist RCA lesion, 80 %stenosed.  Mid RCA to Dist RCA lesion, 80 %stenosed.  LV end diastolic pressure is mildly elevated.  There is no aortic valve stenosis.   Severe diffuse disease of the RCA, Ramus and circumflex.  LAD with only mild to moderate diffuse disease.  PCI would require multiple stents and significant contrast usage.  For now, would treat medically.      Laboratory Data:  Chemistry Recent Labs  Lab 01/01/17 0917  NA 139  K 3.1*  CL 102  CO2 25  GLUCOSE 168*  BUN 25*  CREATININE 2.10*  CALCIUM 9.3  GFRNONAA 25*  GFRAA 29*  ANIONGAP 12    Recent Labs  Lab 01/01/17 0917  PROT 7.8  ALBUMIN 3.7  AST 33  ALT 22  ALKPHOS 105  BILITOT 0.6   Hematology Recent Labs  Lab 01/01/17 0917  WBC 11.3*  RBC 4.99  HGB 12.9  HCT 39.1  MCV 78.4  MCH 25.9*  MCHC 33.0  RDW 14.9  PLT 234   Cardiac Enzymes Recent Labs  Lab 01/01/17 0917 01/01/17 1311  TROPONINI 2.04* 4.90*   No results for input(s): TROPIPOC in the last 168 hours.  BNP Recent Labs  Lab 01/01/17 0917  BNP 962.9*    DDimer No results for input(s): DDIMER in the last 168 hours.  Radiology/Studies:  Dg Chest Portable 1 View  Result Date: 01/01/2017 CLINICAL DATA:  Patient reports shortness of breath and central to left chest pain  since last night. Reports nausea and 2 episodes of vomiting. Tobacco abuse, DM, renal disease, heart cath, hysterectomy EXAM: PORTABLE CHEST 1 VIEW COMPARISON:  09/15/2016 FINDINGS: Heart size is normal. Indistinct opacity in the right mid lung zone measures approximately 4.0 cm. There is mild prominence of interstitial markings diffusely. No evidence for pulmonary edema. No pleural effusions. IMPRESSION: 1. 4 cm right mid lung zone opacity is suspicious for mass but could also represent an infectious infiltrate. Close follow-up is needed to exclude malignancy. 2. Followup PA and lateral chest X-ray is recommended in 3-4 weeks following trial of antibiotic therapy to ensure resolution and exclude underlying malignancy. Electronically Signed   By: Nolon Nations M.D.   On: 01/01/2017 09:40    Assessment & Plan    1. NSTEMI/ CAD -  cardiac catheterization on 09/17/2016 showed severe disease of the RCA (diffuse 80% stenosis), 80% RI2 stenosis, and 100% stenosis of LCx. PCI was not performed due to the fact she would require multiple stents and significant contrast use.  - presented with worsening dyspnea and chest discomfort. Her chest discomfort is not clear angina as it has been worse with coughing and deep inspiration. Initial troponin 2.04 with repeat value of 4.90. EKG shows sinus tachycardia, HR 102, with ST elevation along AVR and LVH with repol abnormality (similar to prior tracings).  - agree with Heparin for 48 hours. Restart PTA ASA, Plavix, BB, Imdur, and statin therapy.  - continue to trend troponin values. Will review cath films with Dr. Ellyn Hack. In the setting of her worsening respiratory status and AKI, would not be an ideal repeat cath  candidate at this time and it is not clear there are any ideal PCI options based off of her recent cath.    2. PVD - s/p left SFA and popliteal stenting in 08/2015.  - remains on ASA, Plavix, and statin therapy.   3. Accelerated HTN -  BP initially  elevated at 186/100, at 174/83 on most recent check.  - continue PTA Amlodipine, Coreg, Hydralazine, and Imdur.   4. HLD - Lipid Panel in 09/2016 showed total cholesterol of 93, HDL 25, and LDL 35. At goal of LDL < 70. - continue Atorvastatin 32m daily.   5. Acute on Chronic Stage 3 CKD - creatinine elevated to 2.10 (baseline 1.6 - 1.7).  - continue to follow.   6. PNA/ Influenza A - has been started on Vancomycin and Tamiflu.  - per admitting team.    For questions or updates, please contact CSpeersPlease consult [email or web address] for contact info under Cardiology/STEMI.  Signed, BErma Heritage PA-C 01/01/2017, 5:39 PM Pager: [phone]

## 2017-01-01 NOTE — ED Notes (Signed)
Pt came off BIPAP and placed on 2lpm [place]. No increase WOB or distress noted.  However patient dropped SPO2 to low 80s. Placed back on BIPAP at this time.

## 2017-01-01 NOTE — Progress Notes (Signed)
Patient is a 56 yo F with known hx of CAD s/p PCI who presents to [facility] with complaints of gradually worsening dyspnea of days duration. On presentation Fever 101.7 CXR right mid lobe infiltrates with suspicion for infection vs others. Trop >2.0 EKG no sign of acute ischemia. ED physician contacted Dr. Flonnie Overman cardiologist on call who will be following. Admitted to [facility] as inpatient status to [facility] unit for NSTEMI vs demand ischemia 2/2 to suspected PNA, poa.  cxr 01/01/17:IMPRESSION: 1. 4 cm right mid lung zone opacity is suspicious for mass but could also represent an infectious infiltrate. Close follow-up is needed to exclude malignancy. 2. Followup PA and lateral chest X-ray is recommended in 3-4 weeks following trial of antibiotic therapy to ensure resolution and exclude underlying malignancy.

## 2017-01-01 NOTE — Progress Notes (Signed)
CRITICAL VALUE ALERT  Critical Value:  4.26  Date & Time Notied:  01/01/2017 1915  Provider Notified: Dr. Erlinda Hong  Orders Received/Actions taken: No new orders, will contact cardiology as well

## 2017-01-01 NOTE — ED Triage Notes (Signed)
Patient reports shortness of breath and central to left chest pain since last night.  Reports nausea and 2 episodes of vomiting.

## 2017-01-01 NOTE — Progress Notes (Signed)
ANTICOAGULATION CONSULT NOTE - Initial Consult  Pharmacy Consult for heparin Indication: chest pain/ACS  No Known Allergies  Patient Measurements: Height: '5\' 4"'  (162.6 cm) Weight: 202 lb 13.2 oz (92 kg) IBW/kg (Calculated) : 54.7 Heparin Dosing Weight: 75 kg   Vital Signs: Temp: 99.9 F (37.7 C) (12/28 1635) Temp Source: Axillary (12/28 1635) BP: 174/83 (12/28 1634) Pulse Rate: 83 (12/28 1634)  Labs: Recent Labs    01/01/17 0917 01/01/17 1311 01/01/17 1733  HGB 12.9  --   --   HCT 39.1  --   --   PLT 234  --   --   HEPARINUNFRC  --   --  0.50  CREATININE 2.10*  --   --   TROPONINI 2.04* 4.90*  --     Estimated Creatinine Clearance: 32.1 mL/min (A) (by C-G formula based on SCr of 2.1 mg/dL (H)).   Medical History: Past Medical History:  Diagnosis Date  . CAD (coronary artery disease)    a. NSTEM 09/2016: cath showing severe diffuse disease of the RCA, ramus and Cx with mild-mod disease of LAD; PCI would require multiple stents and significant contrast usage thus medical therapy recommended.  . Chronic diastolic CHF (congestive heart failure) ([place])   . CKD (chronic kidney disease), stage III ([place])   . Diabetes mellitus with neuropathy ([place])   . Foot amputation status ([place])    a. h/o left foot transmetatarsal amputation  . High cholesterol   . Hypertension    a. patent renal arteries by PV angio 08/2015. b. heavy proteinuria 09/2016 ? nephrotic.  [name] Normocytic anemia   . Obesity   . Proteinuria   . PVD (peripheral vascular disease) ([place])    a. status post left SFA and popliteal stent and left SFA PTA with drug coated balloon in 08/2015.  . Tobacco abuse     Medications:  Medications Prior to Admission  Medication Sig Dispense Refill Last Dose  . amLODipine (NORVASC) 10 MG tablet Take 1 tablet (10 mg total) by mouth daily. Reported on 07/13/2015 30 tablet 3   . aspirin 81 MG EC tablet Take 1 tablet (81 mg total) by mouth daily. Reported on 07/13/2015 90 tablet 0  Taking  . atorvastatin (LIPITOR) 80 MG tablet Take 1 tablet (80 mg total) by mouth daily. 30 tablet 3   . blood glucose meter kit and supplies KIT Dispense based on patient and insurance preference. Use up to four times daily as directed. (FOR ICD-9 250.00, 250.01). (Patient not taking: Reported on 12/31/2016) 1 each 3 Not Taking  . Blood Glucose Monitoring Suppl (ACCU-CHEK AVIVA) device Use as instructed three times daily. (Patient not taking: Reported on 12/31/2016) 1 each 0 Not Taking  . carvedilol (COREG) 25 MG tablet Take 1 tablet (25 mg total) by mouth 2 (two) times daily. 60 tablet 3   . Cholecalciferol (VITAMIN D-3) 1000 units CAPS Take 1,000 Units by mouth daily.    Taking  . clopidogrel (PLAVIX) 75 MG tablet Take 1 tablet (75 mg total) by mouth daily. 30 tablet 3   . docusate sodium (COLACE) 100 MG capsule TAKE ONE CAPSULE BY MOUTH TWICE A DAY 60 capsule 0 Taking  . gabapentin (NEURONTIN) 300 MG capsule Take 1 capsule (300 mg total) by mouth 3 (three) times daily. 90 capsule 3   . glipiZIDE (GLUCOTROL) 10 MG tablet Take 1 tablet (10 mg total) by mouth 2 (two) times daily before a meal. 60 tablet 3   . glucose blood (ACCU-CHEK AVIVA)  test strip Use as instructed three times daily before meals. (Patient not taking: Reported on 12/31/2016) 100 each 12 Not Taking  . hydrALAZINE (APRESOLINE) 100 MG tablet Take 1 tablet (100 mg total) by mouth 2 (two) times daily. 60 tablet 3   . isosorbide mononitrate (IMDUR) 60 MG 24 hr tablet Take 1 tablet (60 mg total) by mouth daily. 30 tablet 3   . Lancet Devices (ACCU-CHEK SOFTCLIX) lancets Use as instructed three times daily before meals. (Patient not taking: Reported on 12/31/2016) 1 each 5 Not Taking  . nitroGLYCERIN (NITROSTAT) 0.4 MG SL tablet Place 1 tablet (0.4 mg total) under the tongue every 5 (five) minutes as needed for chest pain. (Patient not taking: Reported on 12/31/2016) 25 tablet 3 Not Taking  . Omega-3 Fatty Acids (FISH OIL) 1000 MG CAPS  Take 1,000 mg by mouth daily. Reported on 07/13/2015   Taking  . sitaGLIPtin (JANUVIA) 50 MG tablet Take 1 tablet (50 mg total) by mouth daily. 30 tablet 3     Assessment: 25 YOF who presents with shortness of breath, nausea and vomiting. Initial troponin is elevated at 2.04. H/H and plt wnl. Evening heparin level is therapeutic.  Goal of Therapy:  Heparin level 0.3-0.7 units/ml Monitor platelets by anticoagulation protocol: Yes   Plan:  -Continue heparin infusion at 1000 units/hr  -Monitor daily HL, CBC and s/sx of bleeding -F/u rule out NSETMI vs demand ischemia for duration    Hughes Better, PharmD, BCPS Clinical Pharmacist 01/01/2017 6:22 PM

## 2017-01-01 NOTE — H&P (Signed)
History and Physical  Elide Stalzer JKD:[id number] DOB: 1960-10-01 DOA: 01/01/2017  Referring physician: EDP PCP: Arnoldo Morale, MD   Chief Complaint: chest pain/ sob. hypoxia  HPI: Rebekah Johnson is a 56 y.o. female  H/o noninsulin dependent dm2, HTN,  h/o pvd status post left SFA and popliteal artery stent and left SFA PTA with drug-coated balloon in 08/2015, CAD with severe 2 V CAD from cardiac cath 09/2016 on medical management, h/o diastolic chf, ckd III. She was just evaluated by pmd on 12/27, she presented to [facility] ED due to c/o shortness of breath and central to left chest pain since last night.  Reports nausea and 2 episodes of vomiting. She is found to have fever 101.7, + fluA, + mild right mid lobe infiltrates , troponin elevated, she has hypoxia and tachypnea, blood culture obtained, she is given vanc/zosyn in the Ed, she is started on bipap and transfer to [place] stepdown.    Review of Systems:  Detail per HPI, Review of systems are otherwise negative  Past Medical History:  Diagnosis Date  . CAD (coronary artery disease)    a. NSTEM 09/2016: cath showing severe diffuse disease of the RCA, ramus and Cx with mild-mod disease of LAD; PCI would require multiple stents and significant contrast usage thus medical therapy recommended.  . Chronic diastolic CHF (congestive heart failure) ([place])   . CKD (chronic kidney disease), stage III ([place])   . Diabetes mellitus with neuropathy ([place])   . Foot amputation status ([place])    a. h/o left foot transmetatarsal amputation  . High cholesterol   . Hypertension    a. patent renal arteries by PV angio 08/2015. b. heavy proteinuria 09/2016 ? nephrotic.  [name] Normocytic anemia   . Obesity   . Proteinuria   . PVD (peripheral vascular disease) ([place])    a. status post left SFA and popliteal stent and left SFA PTA with drug coated balloon in 08/2015.  . Tobacco abuse    Past Surgical History:  Procedure Laterality Date  . ABDOMINAL HYSTERECTOMY     . AMPUTATION Left 09/06/2015   Procedure: LEFT TRANSMETATARSAL AMPUTATTION;  Surgeon: Newt Minion, MD;  Location: [place];  Service: Orthopedics;  Laterality: Left;  . LEFT HEART CATH AND CORONARY ANGIOGRAPHY N/A 09/17/2016   Procedure: LEFT HEART CATH AND CORONARY ANGIOGRAPHY;  Surgeon: Jettie Booze, MD;  Location: [place] [facility];  Service: Cardiovascular;  Laterality: N/A;  . PERIPHERAL VASCULAR CATHETERIZATION N/A 08/16/2015   Procedure: Abdominal Aortogram;  Surgeon: Elam Dutch, MD;  Location: [place] CV LAB;  Service: Cardiovascular;  Laterality: N/A;  . PERIPHERAL VASCULAR CATHETERIZATION Bilateral 08/16/2015   Procedure: Lower Extremity Angiography;  Surgeon: Elam Dutch, MD;  Location: [place] CV [facility];  Service: Cardiovascular;  Laterality: Bilateral;  . PERIPHERAL VASCULAR CATHETERIZATION Left 08/16/2015   Procedure: Peripheral Vascular Intervention;  Surgeon: Elam Dutch, MD;  Location: [place] [facility];  Service: Cardiovascular;  Laterality: Left;  SFA STENT X 2   Social History:  reports that she has been smoking cigarettes.  She has been smoking about 0.25 packs per day. she has never used smokeless tobacco. She reports that she drinks about 0.6 - 1.2 oz of alcohol per week. She reports that she does not use drugs. Patient lives at home& is able to participate in activities of daily living independently   No Known Allergies  Family History  Problem Relation Age of Onset  . Diabetes Mother   .  Hypertension Father       Prior to Admission medications   Medication Sig Start Date End Date Taking? Authorizing Provider  amLODipine (NORVASC) 10 MG tablet Take 1 tablet (10 mg total) by mouth daily. Reported on 07/13/2015 12/31/16 12/31/17  Arnoldo Morale, MD  aspirin 81 MG EC tablet Take 1 tablet (81 mg total) by mouth daily. Reported on 07/13/2015 07/02/16   Arnoldo Morale, MD  atorvastatin (LIPITOR) 80 MG tablet Take 1 tablet (80 mg total) by mouth daily.  12/31/16   Arnoldo Morale, MD  blood glucose meter kit and supplies KIT Dispense based on patient and insurance preference. Use up to four times daily as directed. (FOR ICD-9 250.00, 250.01). Patient not taking: Reported on 12/31/2016 07/25/15   Ward, Delice Bison, DO  Blood Glucose Monitoring Suppl (ACCU-CHEK AVIVA) device Use as instructed three times daily. Patient not taking: Reported on 12/31/2016 04/22/16   Arnoldo Morale, MD  carvedilol (COREG) 25 MG tablet Take 1 tablet (25 mg total) by mouth 2 (two) times daily. 12/31/16 12/31/17  Arnoldo Morale, MD  Cholecalciferol (VITAMIN D-3) 1000 units CAPS Take 1,000 Units by mouth daily.     [provider]  clopidogrel (PLAVIX) 75 MG tablet Take 1 tablet (75 mg total) by mouth daily. 12/31/16   Arnoldo Morale, MD  docusate sodium (COLACE) 100 MG capsule TAKE ONE CAPSULE BY MOUTH TWICE A DAY 10/26/16   Arnoldo Morale, MD  gabapentin (NEURONTIN) 300 MG capsule Take 1 capsule (300 mg total) by mouth 3 (three) times daily. 12/31/16   Arnoldo Morale, MD  glipiZIDE (GLUCOTROL) 10 MG tablet Take 1 tablet (10 mg total) by mouth 2 (two) times daily before a meal. 12/31/16   Arnoldo Morale, MD  glucose blood (ACCU-CHEK AVIVA) test strip Use as instructed three times daily before meals. Patient not taking: Reported on 12/31/2016 04/22/16   Arnoldo Morale, MD  hydrALAZINE (APRESOLINE) 100 MG tablet Take 1 tablet (100 mg total) by mouth 2 (two) times daily. 12/31/16   Arnoldo Morale, MD  isosorbide mononitrate (IMDUR) 60 MG 24 hr tablet Take 1 tablet (60 mg total) by mouth daily. 12/31/16   Arnoldo Morale, MD  Lancet Devices [facility]) lancets Use as instructed three times daily before meals. Patient not taking: Reported on 12/31/2016 04/22/16   Arnoldo Morale, MD  nitroGLYCERIN (NITROSTAT) 0.4 MG SL tablet Place 1 tablet (0.4 mg total) under the tongue every 5 (five) minutes as needed for chest pain. Patient not taking: Reported on 12/31/2016 10/26/16 01/24/17   Charlie Pitter, PA-C  Omega-3 Fatty Acids (FISH OIL) 1000 MG CAPS Take 1,000 mg by mouth daily. Reported on 07/13/2015    [provider]  sitaGLIPtin (JANUVIA) 50 MG tablet Take 1 tablet (50 mg total) by mouth daily. 12/31/16   Arnoldo Morale, MD    Physical Exam: BP (!) 174/83   Pulse 83   Temp 99.9 F (37.7 C) (Axillary)   Resp (!) 21   Ht 5' 4" (1.626 m)   Wt 92 kg (202 lb 13.2 oz)   SpO2 100%   BMI 34.81 kg/m   General:  Weak, oriented x3 Eyes: PERRL ENT: unremarkable Neck: supple, no JVD Cardiovascular: RRR Respiratory: overall diminished, mild scattered intermittent wheezing, no rales, no rhonchi Abdomen: soft/ND/ND, positive bowel sounds Skin: no rash Musculoskeletal:  No edema Psychiatric: calm/cooperative Neurologic: no focal findings            Labs on Admission:  Basic Metabolic Panel: Recent Labs  Lab 01/01/17  0917  NA 139  K 3.1*  CL 102  CO2 25  GLUCOSE 168*  BUN 25*  CREATININE 2.10*  CALCIUM 9.3   Liver Function Tests: Recent Labs  Lab 01/01/17 0917  AST 33  ALT 22  ALKPHOS 105  BILITOT 0.6  PROT 7.8  ALBUMIN 3.7   No results for input(s): LIPASE, AMYLASE in the last 168 hours. No results for input(s): AMMONIA in the last 168 hours. CBC: Recent Labs  Lab 01/01/17 0917  WBC 11.3*  NEUTROABS 9.2*  HGB 12.9  HCT 39.1  MCV 78.4  PLT 234   Cardiac Enzymes: Recent Labs  Lab 01/01/17 0917 01/01/17 1311  TROPONINI 2.04* 4.90*    BNP (last 3 results) Recent Labs    09/14/16 0809 01/01/17 0917  BNP 549.0* 962.9*    ProBNP (last 3 results) No results for input(s): PROBNP in the last 8760 hours.  CBG: Recent Labs  Lab 01/01/17 1520  GLUCAP 141*    Radiological Exams on Admission: Dg Chest Portable 1 View  Result Date: 01/01/2017 CLINICAL DATA:  Patient reports shortness of breath and central to left chest pain since last night. Reports nausea and 2 episodes of vomiting. Tobacco abuse, DM, renal disease,  heart cath, hysterectomy EXAM: PORTABLE CHEST 1 VIEW COMPARISON:  09/15/2016 FINDINGS: Heart size is normal. Indistinct opacity in the right mid lung zone measures approximately 4.0 cm. There is mild prominence of interstitial markings diffusely. No evidence for pulmonary edema. No pleural effusions. IMPRESSION: 1. 4 cm right mid lung zone opacity is suspicious for mass but could also represent an infectious infiltrate. Close follow-up is needed to exclude malignancy. 2. Followup PA and lateral chest X-ray is recommended in 3-4 weeks following trial of antibiotic therapy to ensure resolution and exclude underlying malignancy. Electronically Signed   By: Nolon Nations M.D.   On: 01/01/2017 09:40    EKG: Independently reviewed. Sinus tachycardia, diffuse t wave inversion  Assessment/Plan Present on Admission: . NSTEMI (non-ST elevated myocardial infarction) ([place])  Influenza a/pna/acute hypoxic respiratory failure: cxr with mild RML infiltrate She is started on tamiflu Continue vanc/zosyn for now, get sputum culture and mrsa screening, deescalate abx if clinically improving, follow up on blood culture.  Troponin elevation With c/o chest pain, ekg with diffuse t flattening inversion Known cad, s/p cath in 09/2016 Currently on heparin drip, trend troponin Cardiology following  diastolic chf, she was hospitalized in 09/2016 for acute diastolic chf, and NSTEMI She was diuresed during last hospitalization I do not see diuretics on her home meds list , but she reports she takes 15m bid at home, currently she appear dry, will not order lasix  hypokalemia Likely from vomiting and taking lasix Replace k  AKI on ckdIII Cr 2.1 on admission, baseline appear to be at 1.6-1.7 ua pending collection Appear dry, hold lasix Renal dosing meds, repeat lab in ma   noninsulin dependent dm2 a1c 7.8 Hold home oral meds, start ssi here  HTN: continue coreg/imdur/hydralzine, continue adjust bp  meds  HLD: continue statin  Obesity: Body mass index is 34.81 kg/m.    DVT prophylaxis: currently on heparin drip  Consultants: cardiology  Code Status: full   Family Communication:  Patient   Disposition Plan: admit to [facility]  Time spent: 761ms  FaFlorencia ReasonsD, PhD [facility] Pager [phone] 7PM-7AM, please contact night-coverage at [email or web address], password [facility]

## 2017-01-02 ENCOUNTER — Inpatient Hospital Stay (HOSPITAL_COMMUNITY): Payer: Medicaid Other

## 2017-01-02 DIAGNOSIS — R0902 Hypoxemia: Secondary | ICD-10-CM

## 2017-01-02 DIAGNOSIS — I214 Non-ST elevation (NSTEMI) myocardial infarction: Principal | ICD-10-CM

## 2017-01-02 DIAGNOSIS — J189 Pneumonia, unspecified organism: Secondary | ICD-10-CM

## 2017-01-02 LAB — BLOOD CULTURE ID PANEL (REFLEXED)

## 2017-01-02 LAB — BASIC METABOLIC PANEL
Anion gap: 12 (ref 5–15)
BUN: 28 mg/dL — AB (ref 6–20)
CALCIUM: 8.5 mg/dL — AB (ref 8.9–10.3)
CO2: 20 mmol/L — AB (ref 22–32)
CREATININE: 2.62 mg/dL — AB (ref 0.44–1.00)
Chloride: 101 mmol/L (ref 101–111)
GFR calc non Af Amer: 19 mL/min — ABNORMAL LOW (ref >60)
GFR, EST AFRICAN AMERICAN: 22 mL/min — AB (ref >60)
Glucose, Bld: 135 mg/dL — ABNORMAL HIGH (ref 65–99)
Potassium: 3.5 mmol/L (ref 3.5–5.1)
SODIUM: 133 mmol/L — AB (ref 135–145)

## 2017-01-02 LAB — URINALYSIS, ROUTINE W REFLEX MICROSCOPIC
Bilirubin Urine: NEGATIVE
Glucose, UA: 50 mg/dL — AB
Ketones, ur: NEGATIVE mg/dL
Leukocytes, UA: NEGATIVE
Nitrite: NEGATIVE
Specific Gravity, Urine: 1.017 (ref 1.005–1.030)
pH: 5 (ref 5.0–8.0)

## 2017-01-02 LAB — CBC
HCT: 35.7 % — ABNORMAL LOW (ref 36.0–46.0)
Hemoglobin: 11.4 g/dL — ABNORMAL LOW (ref 12.0–15.0)
MCH: 25.7 pg — AB (ref 26.0–34.0)
MCHC: 31.9 g/dL (ref 30.0–36.0)
MCV: 80.4 fL (ref 78.0–100.0)
PLATELETS: 178 10*3/uL (ref 150–400)
RBC: 4.44 MIL/uL (ref 3.87–5.11)
RDW: 14.8 % (ref 11.5–15.5)
WBC: 6.8 10*3/uL (ref 4.0–10.5)

## 2017-01-02 LAB — GLUCOSE, CAPILLARY
GLUCOSE-CAPILLARY: 148 mg/dL — AB (ref 65–99)
Glucose-Capillary: 131 mg/dL — ABNORMAL HIGH (ref 65–99)
Glucose-Capillary: 189 mg/dL — ABNORMAL HIGH (ref 65–99)
Glucose-Capillary: 123 mg/dL — ABNORMAL HIGH (ref 65–99)
Glucose-Capillary: 156 mg/dL — ABNORMAL HIGH (ref 65–99)

## 2017-01-02 LAB — HEPARIN LEVEL (UNFRACTIONATED): Heparin Unfractionated: 0.54 IU/mL (ref 0.30–0.70)

## 2017-01-02 LAB — HIV ANTIBODY (ROUTINE TESTING W REFLEX): HIV SCREEN 4TH GENERATION: NONREACTIVE

## 2017-01-02 LAB — TROPONIN I
TROPONIN I: 4.34 ng/mL — AB (ref <0.03)
Troponin I: 3.58 ng/mL (ref <0.03)

## 2017-01-02 LAB — LACTIC ACID, PLASMA: LACTIC ACID, VENOUS: 1.3 mmol/L (ref 0.5–1.9)

## 2017-01-02 LAB — MAGNESIUM: Magnesium: 1.7 mg/dL (ref 1.7–2.4)

## 2017-01-02 MED ORDER — TRAMADOL HCL 50 MG PO TABS
50.0000 mg | ORAL_TABLET | Freq: Four times a day (QID) | ORAL | Status: DC | PRN
  Administered 2017-01-02 – 2017-01-06 (×7): 50 mg via ORAL
  Filled 2017-01-02 (×7): qty 1

## 2017-01-02 MED ORDER — MORPHINE SULFATE (PF) 2 MG/ML IV SOLN
2.0000 mg | INTRAVENOUS | Status: DC | PRN
  Administered 2017-01-02 – 2017-01-03 (×5): 2 mg via INTRAVENOUS
  Filled 2017-01-02 (×5): qty 1

## 2017-01-02 MED ORDER — IPRATROPIUM-ALBUTEROL 0.5-2.5 (3) MG/3ML IN SOLN
3.0000 mL | Freq: Three times a day (TID) | RESPIRATORY_TRACT | Status: DC
  Administered 2017-01-02 (×2): 3 mL via RESPIRATORY_TRACT
  Filled 2017-01-02 (×2): qty 3

## 2017-01-02 MED ORDER — IPRATROPIUM-ALBUTEROL 0.5-2.5 (3) MG/3ML IN SOLN: 3.0000 mL | Freq: Four times a day (QID) | RESPIRATORY_TRACT | Status: DC | PRN

## 2017-01-02 MED ORDER — OSELTAMIVIR PHOSPHATE 30 MG PO CAPS
30.0000 mg | ORAL_CAPSULE | Freq: Every day | ORAL | Status: DC
  Administered 2017-01-03 – 2017-01-06 (×4): 30 mg via ORAL
  Filled 2017-01-02 (×4): qty 1

## 2017-01-02 MED ORDER — DEXTROSE 5 % IV SOLN
2.0000 g | INTRAVENOUS | Status: DC
  Administered 2017-01-02 – 2017-01-03 (×2): 2 g via INTRAVENOUS
  Filled 2017-01-02 (×3): qty 2

## 2017-01-02 MED ORDER — HYDRALAZINE HCL 50 MG PO TABS
50.0000 mg | ORAL_TABLET | Freq: Two times a day (BID) | ORAL | Status: DC
  Administered 2017-01-02 – 2017-01-04 (×4): 50 mg via ORAL
  Filled 2017-01-02 (×4): qty 1

## 2017-01-02 MED ORDER — PNEUMOCOCCAL VAC POLYVALENT 25 MCG/0.5ML IJ INJ
0.5000 mL | INJECTION | INTRAMUSCULAR | Status: AC
  Administered 2017-01-06: 0.5 mL via INTRAMUSCULAR
  Filled 2017-01-02: qty 0.5

## 2017-01-02 NOTE — Progress Notes (Signed)
PHARMACY - PHYSICIAN COMMUNICATION CRITICAL VALUE ALERT - BLOOD CULTURE IDENTIFICATION (BCID)  Rebekah Johnson is an 56 y.o. female who presented to [facility] on 01/01/2017 with a chief complaint of CP, SOB, hypoxia.   Assessment:  Tmax 101.7, WBC 11.3, LA 1.66. + for FluA on tamiflu.   Name of physician (or Provider) Contacted: X. Blount, NP   Current antibiotics: None   Changes to prescribed antibiotics recommended:  None, monitor clinical picture and f/u with dayshift providers.   Results for orders placed or performed during the hospital encounter of 01/01/17  Blood Culture ID Panel (Reflexed) (Collected: 01/01/2017  9:55 AM)  Result Value Ref Range   Enterococcus species NOT DETECTED NOT DETECTED   Listeria monocytogenes NOT DETECTED NOT DETECTED   Staphylococcus species NOT DETECTED NOT DETECTED   Staphylococcus aureus NOT DETECTED NOT DETECTED   Streptococcus species DETECTED (A) NOT DETECTED   Streptococcus agalactiae NOT DETECTED NOT DETECTED   Streptococcus pneumoniae NOT DETECTED NOT DETECTED   Streptococcus pyogenes NOT DETECTED NOT DETECTED   Acinetobacter baumannii NOT DETECTED NOT DETECTED   Enterobacteriaceae species NOT DETECTED NOT DETECTED   Enterobacter cloacae complex NOT DETECTED NOT DETECTED   Escherichia coli NOT DETECTED NOT DETECTED   Klebsiella oxytoca NOT DETECTED NOT DETECTED   Klebsiella pneumoniae NOT DETECTED NOT DETECTED   Proteus species NOT DETECTED NOT DETECTED   Serratia marcescens NOT DETECTED NOT DETECTED   Haemophilus influenzae NOT DETECTED NOT DETECTED   Neisseria meningitidis NOT DETECTED NOT DETECTED   Pseudomonas aeruginosa NOT DETECTED NOT DETECTED   Candida albicans NOT DETECTED NOT DETECTED   Candida glabrata NOT DETECTED NOT DETECTED   Candida krusei NOT DETECTED NOT DETECTED   Candida parapsilosis NOT DETECTED NOT DETECTED   Candida tropicalis NOT DETECTED NOT DETECTED   Lavonda Jumbo, PharmD Clinical  Pharmacist 01/02/17 6:23 AM

## 2017-01-02 NOTE — Progress Notes (Signed)
PHARMACY NOTE:  ANTIMICROBIAL RENAL DOSAGE ADJUSTMENT  Current antimicrobial regimen includes a mismatch between antimicrobial dosage and estimated renal function.  As per policy approved by the Pharmacy & Therapeutics and Medical Executive Committees, the antimicrobial dosage will be adjusted accordingly.  Current antimicrobial dosage:  Tamiflu 30mg  q12h  Indication: Influenza  Renal Function: CrCl 25 ml/min  Estimated Creatinine Clearance: 25.7 mL/min (A) (by C-G formula based on SCr of 2.62 mg/dL (H)). []      On intermittent HD, scheduled: []      On CRRT    Antimicrobial dosage has been changed to:  Tamiflu 30mg  q24h  Additional comments: Adding ceftriaxone per Dr. Broadus John for BCID with strep species.   Thank you for allowing pharmacy to be a part of this patient's care.  Arrie Senate, PharmD, BCPS PGY-2 Cardiology Pharmacy Resident Pager: [phone] 01/02/2017

## 2017-01-02 NOTE — Progress Notes (Signed)
Cardiology Rounding Note  Patient Name: Rebekah Johnson Date of Encounter: 01/02/2017    Subjective   The patient is quite ill.  Denies CP except with cough.  + cough and SOB.  Inpatient Medications    Scheduled Meds: . aspirin EC  81 mg Oral Daily  . atorvastatin  80 mg Oral Daily  . carvedilol  25 mg Oral BID  . cholecalciferol  1,000 Units Oral Daily  . clopidogrel  75 mg Oral Daily  . gabapentin  300 mg Oral TID  . guaiFENesin  600 mg Oral BID  . hydrALAZINE  100 mg Oral BID  . insulin aspart  0-15 Units Subcutaneous TID WC  . ipratropium-albuterol  3 mL Nebulization TID  . isosorbide mononitrate  60 mg Oral Daily  . [START ON 01/03/2017] oseltamivir  30 mg Oral Daily  . oxymetazoline  1 spray Each Nare Once  . [START ON 01/03/2017] pneumococcal 23 valent vaccine  0.5 mL Intramuscular Tomorrow-1000   Continuous Infusions: . cefTRIAXone (ROCEPHIN)  IV    . heparin 1,000 Units/hr (01/02/17 1100)   PRN Meds: labetalol, nitroGLYCERIN, traMADol   Vital Signs    Vitals:   01/02/17 0022 01/02/17 0346 01/02/17 0636 01/02/17 0833  BP: 136/69 115/75  (!) 139/56  Pulse: 77 72 75 70  Resp: 18 19 20 16   Temp: 98.6 F (37 C) 99.5 F (37.5 C)  99.4 F (37.4 C)  TempSrc: Oral Oral  Axillary  SpO2: 97% 97% 100% 99%  Weight:      Height:        Intake/Output Summary (Last 24 hours) at 01/02/2017 1121 Last data filed at 01/02/2017 1100 Gross per 24 hour  Intake 1424.17 ml  Output 375 ml  Net 1049.17 ml   Filed Weights   01/01/17 0909 01/01/17 1635  Weight: 201 lb (91.2 kg) 202 lb 13.2 oz (92 kg)    Physical Exam    GEN- The patient is ill appearing, alert and oriented x 3 today.   Head- normocephalic, atraumatic Eyes-  Sclera clear, conjunctiva pink Ears- hearing intact Oropharynx- clear Neck- supple Lungs- Coarse BS, normal work of breathing Heart- Regular rate and rhythm, no murmurs, rubs or gallops GI- soft, NT, ND, + BS Extremities- no clubbing,  cyanosis, or edema Skin- no rash or lesion Psych- euthymic mood, full affect Neuro- strength and sensation are intact  Labs    CBC Recent Labs    01/01/17 0917 01/02/17 0857  WBC 11.3* 6.8  NEUTROABS 9.2*  --   HGB 12.9 11.4*  HCT 39.1 35.7*  MCV 78.4 80.4  PLT 234 510   Basic Metabolic Panel Recent Labs    01/01/17 0917 01/01/17 2311 01/02/17 0857  NA 139  --  133*  K 3.1*  --  3.5  CL 102  --  101  CO2 25  --  20*  GLUCOSE 168*  --  135*  BUN 25*  --  28*  CREATININE 2.10*  --  2.62*  CALCIUM 9.3  --  8.5*  MG  --  1.7  --    Liver Function Tests Recent Labs    01/01/17 0917  AST 33  ALT 22  ALKPHOS 105  BILITOT 0.6  PROT 7.8  ALBUMIN 3.7   No results for input(s): LIPASE, AMYLASE in the last 72 hours. Cardiac Enzymes Recent Labs    01/01/17 1738 01/01/17 2311 01/02/17 0857  TROPONINI 4.26* 4.34* 3.58*   BNP Invalid input(s): POCBNP D-Dimer No results  for input(s): DDIMER in the last 72 hours. Hemoglobin A1C Recent Labs    12/31/16 1013  HGBA1C 7.8     Telemetry    Sinus rhythm (personally reviewed)  Radiology    Dg Chest Port 1 View  Result Date: 01/02/2017 CLINICAL DATA:  Shortness of breath and chest pain for 2 days EXAM: PORTABLE CHEST 1 VIEW COMPARISON:  January 01, 2017 FINDINGS: The heart size and mediastinal contours are stable. There is mild pulmonary edema. There is no focal pneumonia or pleural effusion. The previously noted consolidation of the lateral right mid lung is not seen today. The visualized skeletal structures are stable. IMPRESSION: Mild pulmonary edema. The previously noted opacity of the right lateral mid lung has resolved. Electronically Signed   By: Abelardo Diesel M.D.   On: 01/02/2017 10:12   Dg Chest Portable 1 View  Result Date: 01/01/2017 CLINICAL DATA:  Patient reports shortness of breath and central to left chest pain since last night. Reports nausea and 2 episodes of vomiting. Tobacco abuse, DM, renal  disease, heart cath, hysterectomy EXAM: PORTABLE CHEST 1 VIEW COMPARISON:  09/15/2016 FINDINGS: Heart size is normal. Indistinct opacity in the right mid lung zone measures approximately 4.0 cm. There is mild prominence of interstitial markings diffusely. No evidence for pulmonary edema. No pleural effusions. IMPRESSION: 1. 4 cm right mid lung zone opacity is suspicious for mass but could also represent an infectious infiltrate. Close follow-up is needed to exclude malignancy. 2. Followup PA and lateral chest X-ray is recommended in 3-4 weeks following trial of antibiotic therapy to ensure resolution and exclude underlying malignancy. Electronically Signed   By: Nolon Nations M.D.   On: 01/01/2017 09:40     Patient Profile     Rebekah Johnson is a 56 y.o. female with a past medical history significant for CAD.  He was admitted for influenza and acute hypoxic respiratory failure.  She has known advanced CAD and now has NSTEMI with her acute medical illness.   Assessment & Plan    1. NSTEMI/ CAD Currently not a candidate for intervention. Acute MI is secondary to underlying acute medical illness (influenza).  I would therefore recommend supportive care. Continue on IV heparin for another 24 hours On ASA, plavix, beta blocker, imdur, statin  2. PVD On ASA, Plavix  3. HTN Stable No change required today  4. Acute on chronic stage III renal failure Not a candidate for cath Hopefully will improve with treatment of influenza  Cardiology to follow along  Signed, Thompson Grayer, MD  01/02/2017, 11:21 AM

## 2017-01-02 NOTE — Progress Notes (Signed)
Pt refuses to take insulin, notified Dr Broadus John

## 2017-01-02 NOTE — Progress Notes (Signed)
PROGRESS NOTE    Rebekah Johnson  HUD:[id number] DOB: December 29, 1960 DOA: 01/01/2017 PCP: Arnoldo Morale, MD  Brief Narrative:Rebekah Johnson is a 56 y.o. female  H/o noninsulin dependent dm2, HTN,  h/o pvd status postleft SFA and popliteal artery stent and left SFA PTA with drug-coated balloonin 08/2015, CAD with severe 2 V CAD from cardiac cath 09/2016 on medical management, h/o diastolic chf, ckd III. She was just evaluated by pmd on 12/27, she presented to [facility] ED due to c/o shortness of breath and central to left chest pain since last night. Reports nausea and 2 episodes of vomiting. She is found to have fever 101.7, + fluA, + mild right mid lobe infiltrates , troponin elevated, she has hypoxia and tachypnea, blood culture obtained, she is given vanc/zosyn in the Ed, she is started on bipap and transfered to [facility] stepdown.  Assessment & Plan:     ACute hypoxic resp failure -Due to influenza/pneumonia -Improving, continue Tamiflu, wean oxygen -Off BiPAP -Follow-up blood cultures with GPC strep in one out of 2 likely contaminant, start IV ceftriaxone until clinical picture becomes more clear    NSTEMI (non-ST elevated myocardial infarction) ([place]) -h/o severe 2vessel CAD on medical management -trop significantly elevated in setting of hypoxia/flu -currently on heparin -Cards consulted -Continue aspirin/Plavix, Coreg, statin and Imdur  Chronic diastolic CHF -Recently admitted in September for diastolic CHF and NSTEMI -Diuresed during that hospitalization reportedly takes 40 mg by mouth twice a day of Lasix at home although it's not on her medication list -Hold Lasix today, restart in 1-2 days  hypokalemia Likely from vomiting and taking lasix Replaced  AKi on CKD3 -Cr 2.1 on admission, baseline appear to be at 1.6-1.7 -creatinine has trended up to 2.6 today likely low-grade sepsis from flu , monitor, holding diuretics  -suspect some degree of cardiorenal syndrome from NSTEMI  too contributing  Noninsulin dependent dm2 -a1c 7.8 -continue SSI  HTN: continue coreg/imdur/hydralzine -cut down hydralazine  HLD: continue statin  Obesity: Body mass index is 34.81 kg/m.  DVT prophylaxis: currently on heparin drip  Consultants: cardiology Code Status: full  Family Communication:  Patient   Disposition Plan: to be determined   Consultants:   Cardiology   Procedures:   Antimicrobials:  Antibiotics Given (last 72 hours)    Date/Time Action Medication Dose Rate   01/01/17 1004 New Bag/Given   piperacillin-tazobactam (ZOSYN) IVPB 3.375 g 3.375 g 100 mL/hr   01/01/17 1038 New Bag/Given   vancomycin (VANCOCIN) 2,000 mg in sodium chloride 0.9 % 500 mL IVPB 2,000 mg 250 mL/hr   01/01/17 1838 Given   oseltamivir (TAMIFLU) capsule 75 mg 75 mg    01/01/17 2227 Given   oseltamivir (TAMIFLU) capsule 30 mg 30 mg    01/02/17 8588 Given   oseltamivir (TAMIFLU) capsule 30 mg 30 mg       Subjective: -Breathing better, chest pain improving, some cough and congestion  Objective: Vitals:   01/02/17 0022 01/02/17 0346 01/02/17 0636 01/02/17 0833  BP: 136/69 115/75  (!) 139/56  Pulse: 77 72 75 70  Resp: 18 19 20 16   Temp: 98.6 F (37 C) 99.5 F (37.5 C)  99.4 F (37.4 C)  TempSrc: Oral Oral  Axillary  SpO2: 97% 97% 100% 99%  Weight:      Height:        Intake/Output Summary (Last 24 hours) at 01/02/2017 1116 Last data filed at 01/02/2017 1100 Gross per 24 hour  Intake 1424.17 ml  Output 375 ml  Net 1049.17 ml   Filed Weights   01/01/17 0909 01/01/17 1635  Weight: 91.2 kg (201 lb) 92 kg (202 lb 13.2 oz)    Examination:  General exam: Appears calm and comfortable, no distress Respiratory system: Few basilar crackles Cardiovascular system: S1 & S2 heard, RRR. No JVD, murmurs, rubs, gallops  Gastrointestinal system: Abdomen is nondistended, soft and nontender.Normal bowel sounds heard. Central nervous system: Alert and oriented. No  focal neurological deficits. Extremities: Symmetric 5 x 5 power. Skin: No rashes, lesions or ulcers Psychiatry: Judgement and insight appear normal. Mood & affect appropriate.     Data Reviewed:   CBC: Recent Labs  Lab 01/01/17 0917 01/02/17 0857  WBC 11.3* 6.8  NEUTROABS 9.2*  --   HGB 12.9 11.4*  HCT 39.1 35.7*  MCV 78.4 80.4  PLT 234 811   Basic Metabolic Panel: Recent Labs  Lab 01/01/17 0917 01/01/17 2311 01/02/17 0857  NA 139  --  133*  K 3.1*  --  3.5  CL 102  --  101  CO2 25  --  20*  GLUCOSE 168*  --  135*  BUN 25*  --  28*  CREATININE 2.10*  --  2.62*  CALCIUM 9.3  --  8.5*  MG  --  1.7  --    GFR: Estimated Creatinine Clearance: 25.7 mL/min (A) (by C-G formula based on SCr of 2.62 mg/dL (H)). Liver Function Tests: Recent Labs  Lab 01/01/17 0917  AST 33  ALT 22  ALKPHOS 105  BILITOT 0.6  PROT 7.8  ALBUMIN 3.7   No results for input(s): LIPASE, AMYLASE in the last 168 hours. No results for input(s): AMMONIA in the last 168 hours. Coagulation Profile: No results for input(s): INR, PROTIME in the last 168 hours. Cardiac Enzymes: Recent Labs  Lab 01/01/17 0917 01/01/17 1311 01/01/17 1738 01/01/17 2311 01/02/17 0857  TROPONINI 2.04* 4.90* 4.26* 4.34* 3.58*   BNP (last 3 results) No results for input(s): PROBNP in the last 8760 hours. HbA1C: Recent Labs    12/31/16 1013  HGBA1C 7.8   CBG: Recent Labs  Lab 01/01/17 1520 01/02/17 0052 01/02/17 0831  GLUCAP 141* 148* 131*   Lipid Profile: No results for input(s): CHOL, HDL, LDLCALC, TRIG, CHOLHDL, LDLDIRECT in the last 72 hours. Thyroid Function Tests: No results for input(s): TSH, T4TOTAL, FREET4, T3FREE, THYROIDAB in the last 72 hours. Anemia Panel: No results for input(s): VITAMINB12, FOLATE, FERRITIN, TIBC, IRON, RETICCTPCT in the last 72 hours. Urine analysis:    Component Value Date/Time   COLORURINE YELLOW 01/02/2017 0037   APPEARANCEUR HAZY (A) 01/02/2017 0037    LABSPEC 1.017 01/02/2017 0037   PHURINE 5.0 01/02/2017 0037   GLUCOSEU 50 (A) 01/02/2017 0037   HGBUR SMALL (A) 01/02/2017 0037   BILIRUBINUR NEGATIVE 01/02/2017 0037   KETONESUR NEGATIVE 01/02/2017 0037   PROTEINUR >=300 (A) 01/02/2017 0037   NITRITE NEGATIVE 01/02/2017 0037   LEUKOCYTESUR NEGATIVE 01/02/2017 0037   Sepsis Labs: @LABRCNTIP (procalcitonin:4,lacticidven:4)  ) Recent Results (from the past 240 hour(s))  Blood culture (routine x 2)     Status: None (Preliminary result)   Collection Time: 01/01/17  9:45 AM  Result Value Ref Range Status   Specimen Description   Final    BLOOD LEFT ANTECUBITAL Performed at [facility], [place] [address] [place],  [address]    Special Requests   Final    BOTTLES DRAWN AEROBIC AND ANAEROBIC Blood Culture adequate volume   Culture PENDING  Incomplete  Report Status PENDING  Incomplete  Blood culture (routine x 2)     Status: None (Preliminary result)   Collection Time: 01/01/17  9:55 AM  Result Value Ref Range Status   Specimen Description BLOOD RIGHT ANTECUBITAL  Final   Special Requests   Final    BOTTLES DRAWN AEROBIC AND ANAEROBIC Blood Culture adequate volume   Culture  Setup Time   Final    GRAM POSITIVE COCCI IN CHAINS ANAEROBIC BOTTLE ONLY Organism ID to follow CRITICAL RESULT CALLED TO, READ BACK BY AND VERIFIED WITHRich Fuchs PHARMD [id number] 01/02/17 A BROWNING Performed at [facility], [place] [address] [place], [place] [address]    Culture PENDING  Incomplete   Report Status PENDING  Incomplete  Blood Culture ID Panel (Reflexed)     Status: Abnormal   Collection Time: 01/01/17  9:55 AM  Result Value Ref Range Status   Enterococcus species NOT DETECTED NOT DETECTED Final   Listeria monocytogenes NOT DETECTED NOT DETECTED Final   Staphylococcus species NOT DETECTED NOT DETECTED Final   Staphylococcus aureus NOT DETECTED NOT DETECTED Final   Streptococcus species DETECTED (A) NOT DETECTED Final     Comment: Not Enterococcus species, Streptococcus agalactiae, Streptococcus pyogenes, or Streptococcus pneumoniae. CRITICAL RESULT CALLED TO, READ BACK BY AND VERIFIED WITH: Rich Fuchs PHARMD [id number] 01/02/17 A BROWNING    Streptococcus agalactiae NOT DETECTED NOT DETECTED Final   Streptococcus pneumoniae NOT DETECTED NOT DETECTED Final   Streptococcus pyogenes NOT DETECTED NOT DETECTED Final   Acinetobacter baumannii NOT DETECTED NOT DETECTED Final   Enterobacteriaceae species NOT DETECTED NOT DETECTED Final   Enterobacter cloacae complex NOT DETECTED NOT DETECTED Final   Escherichia coli NOT DETECTED NOT DETECTED Final   Klebsiella oxytoca NOT DETECTED NOT DETECTED Final   Klebsiella pneumoniae NOT DETECTED NOT DETECTED Final   Proteus species NOT DETECTED NOT DETECTED Final   Serratia marcescens NOT DETECTED NOT DETECTED Final   Haemophilus influenzae NOT DETECTED NOT DETECTED Final   Neisseria meningitidis NOT DETECTED NOT DETECTED Final   Pseudomonas aeruginosa NOT DETECTED NOT DETECTED Final   Candida albicans NOT DETECTED NOT DETECTED Final   Candida glabrata NOT DETECTED NOT DETECTED Final   Candida krusei NOT DETECTED NOT DETECTED Final   Candida parapsilosis NOT DETECTED NOT DETECTED Final   Candida tropicalis NOT DETECTED NOT DETECTED Final    Comment: Performed at [facility], [place]. [address], [place], [place] [address]  MRSA PCR Screening     Status: None   Collection Time: 01/01/17  5:36 PM  Result Value Ref Range Status   MRSA by PCR NEGATIVE NEGATIVE Final    Comment:        The GeneXpert MRSA Assay (FDA approved for NASAL specimens only), is one component of a comprehensive MRSA colonization surveillance program. It is not intended to diagnose MRSA infection nor to guide or monitor treatment for MRSA infections.          Radiology Studies: Dg Chest Port 1 View  Result Date: 01/02/2017 CLINICAL DATA:  Shortness of breath and chest pain for 2 days EXAM:  PORTABLE CHEST 1 VIEW COMPARISON:  January 01, 2017 FINDINGS: The heart size and mediastinal contours are stable. There is mild pulmonary edema. There is no focal pneumonia or pleural effusion. The previously noted consolidation of the lateral right mid lung is not seen today. The visualized skeletal structures are stable. IMPRESSION: Mild pulmonary edema. The previously noted opacity of the right lateral mid  lung has resolved. Electronically Signed   By: Abelardo Diesel M.D.   On: 01/02/2017 10:12   Dg Chest Portable 1 View  Result Date: 01/01/2017 CLINICAL DATA:  Patient reports shortness of breath and central to left chest pain since last night. Reports nausea and 2 episodes of vomiting. Tobacco abuse, DM, renal disease, heart cath, hysterectomy EXAM: PORTABLE CHEST 1 VIEW COMPARISON:  09/15/2016 FINDINGS: Heart size is normal. Indistinct opacity in the right mid lung zone measures approximately 4.0 cm. There is mild prominence of interstitial markings diffusely. No evidence for pulmonary edema. No pleural effusions. IMPRESSION: 1. 4 cm right mid lung zone opacity is suspicious for mass but could also represent an infectious infiltrate. Close follow-up is needed to exclude malignancy. 2. Followup PA and lateral chest X-ray is recommended in 3-4 weeks following trial of antibiotic therapy to ensure resolution and exclude underlying malignancy. Electronically Signed   By: Nolon Nations M.D.   On: 01/01/2017 09:40        Scheduled Meds: . aspirin EC  81 mg Oral Daily  . atorvastatin  80 mg Oral Daily  . carvedilol  25 mg Oral BID  . cholecalciferol  1,000 Units Oral Daily  . clopidogrel  75 mg Oral Daily  . gabapentin  300 mg Oral TID  . guaiFENesin  600 mg Oral BID  . hydrALAZINE  100 mg Oral BID  . insulin aspart  0-15 Units Subcutaneous TID WC  . ipratropium-albuterol  3 mL Nebulization TID  . isosorbide mononitrate  60 mg Oral Daily  . oseltamivir  30 mg Oral BID  . oxymetazoline  1  spray Each Nare Once  . [START ON 01/03/2017] pneumococcal 23 valent vaccine  0.5 mL Intramuscular Tomorrow-1000   Continuous Infusions: . heparin 1,000 Units/hr (01/02/17 1100)     LOS: 1 day    Time spent: 66min    Domenic Polite, MD [facility] Page via [email or web address], password [email or web address] After 7PM please contact night-coverage  01/02/2017, 11:16 AM

## 2017-01-02 NOTE — Progress Notes (Signed)
ANTICOAGULATION CONSULT NOTE - Follow-Up Consult  Pharmacy Consult for heparin Indication: chest pain/ACS  No Known Allergies  Patient Measurements: Height: '5\' 4"'  (162.6 cm) Weight: 202 lb 13.2 oz (92 kg) IBW/kg (Calculated) : 54.7 Heparin Dosing Weight: 75 kg   Vital Signs: Temp: 99.4 F (37.4 C) (12/29 0833) Temp Source: Axillary (12/29 0833) BP: 139/56 (12/29 0833) Pulse Rate: 70 (12/29 0833)  Labs: Recent Labs    01/01/17 0917  01/01/17 1733 01/01/17 1738 01/01/17 2311 01/02/17 0857  HGB 12.9  --   --   --   --  11.4*  HCT 39.1  --   --   --   --  35.7*  PLT 234  --   --   --   --  178  HEPARINUNFRC  --   --  0.50  --   --  0.54  CREATININE 2.10*  --   --   --   --  2.62*  TROPONINI 2.04*   < >  --  4.26* 4.34* 3.58*   < > = values in this interval not displayed.    Estimated Creatinine Clearance: 25.7 mL/min (A) (by C-G formula based on SCr of 2.62 mg/dL (H)).   Medical History: Past Medical History:  Diagnosis Date  . CAD (coronary artery disease)    a. NSTEM 09/2016: cath showing severe diffuse disease of the RCA, ramus and Cx with mild-mod disease of LAD; PCI would require multiple stents and significant contrast usage thus medical therapy recommended.  . Chronic diastolic CHF (congestive heart failure) ([place])   . CKD (chronic kidney disease), stage III ([place])   . Diabetes mellitus with neuropathy ([place])   . Foot amputation status ([place])    a. h/o left foot transmetatarsal amputation  . High cholesterol   . Hypertension    a. patent renal arteries by PV angio 08/2015. b. heavy proteinuria 09/2016 ? nephrotic.  [name] Normocytic anemia   . Obesity   . Proteinuria   . PVD (peripheral vascular disease) ([place])    a. status post left SFA and popliteal stent and left SFA PTA with drug coated balloon in 08/2015.  . Tobacco abuse     Medications:  Medications Prior to Admission  Medication Sig Dispense Refill Last Dose  . amLODipine (NORVASC) 10 MG tablet Take 1  tablet (10 mg total) by mouth daily. Reported on 07/13/2015 30 tablet 3 12/31/2016 at Unknown time  . aspirin 81 MG EC tablet Take 1 tablet (81 mg total) by mouth daily. Reported on 07/13/2015 90 tablet 0 12/31/2016 at Unknown time  . atorvastatin (LIPITOR) 80 MG tablet Take 1 tablet (80 mg total) by mouth daily. 30 tablet 3 12/31/2016 at Unknown time  . carvedilol (COREG) 25 MG tablet Take 1 tablet (25 mg total) by mouth 2 (two) times daily. 60 tablet 3 12/31/2016 at AM  . Cholecalciferol (VITAMIN D-3) 1000 units CAPS Take 1,000 Units by mouth daily.    12/31/2016 at Unknown time  . clopidogrel (PLAVIX) 75 MG tablet Take 1 tablet (75 mg total) by mouth daily. 30 tablet 3 12/31/2016 at Unknown time  . docusate sodium (COLACE) 100 MG capsule TAKE ONE CAPSULE BY MOUTH TWICE A DAY 60 capsule 0 12/31/2016 at Unknown time  . gabapentin (NEURONTIN) 300 MG capsule Take 1 capsule (300 mg total) by mouth 3 (three) times daily. 90 capsule 3 12/31/2016 at Unknown time  . glipiZIDE (GLUCOTROL) 10 MG tablet Take 1 tablet (10 mg total) by mouth 2 (two)  times daily before a meal. 60 tablet 3 12/31/2016 at AM  . hydrALAZINE (APRESOLINE) 100 MG tablet Take 1 tablet (100 mg total) by mouth 2 (two) times daily. 60 tablet 3 12/31/2016 at AM  . isosorbide mononitrate (IMDUR) 60 MG 24 hr tablet Take 1 tablet (60 mg total) by mouth daily. 30 tablet 3 12/31/2016 at Unknown time  . nitroGLYCERIN (NITROSTAT) 0.4 MG SL tablet Place 1 tablet (0.4 mg total) under the tongue every 5 (five) minutes as needed for chest pain. 25 tablet 3  at PRN  . Omega-3 Fatty Acids (FISH OIL) 1000 MG CAPS Take 1,000 mg by mouth daily. Reported on 07/13/2015   12/31/2016 at Unknown time  . sitaGLIPtin (JANUVIA) 50 MG tablet Take 1 tablet (50 mg total) by mouth daily. 30 tablet 3 12/31/2016 at Unknown time  . blood glucose meter kit and supplies KIT Dispense based on patient and insurance preference. Use up to four times daily as directed. (FOR ICD-9  250.00, 250.01). (Patient not taking: Reported on 12/31/2016) 1 each 3 Not Taking  . Blood Glucose Monitoring Suppl (ACCU-CHEK AVIVA) device Use as instructed three times daily. (Patient not taking: Reported on 12/31/2016) 1 each 0 Not Taking  . glucose blood (ACCU-CHEK AVIVA) test strip Use as instructed three times daily before meals. (Patient not taking: Reported on 12/31/2016) 100 each 12 Not Taking  . Lancet Devices (ACCU-CHEK SOFTCLIX) lancets Use as instructed three times daily before meals. (Patient not taking: Reported on 12/31/2016) 1 each 5 Not Taking    Assessment: 21 YOF who presents with shortness of breath, nausea and vomiting. Initial troponin is elevated at 2.04, now trending up. Heparin level therapeutic this morning at 0.54, CBC stable. Per cards, no plans for PCI at this time.  Goal of Therapy:  Heparin level 0.3-0.7 units/ml Monitor platelets by anticoagulation protocol: Yes   Plan:  -Continue heparin infusion at 1000 units/hr  -Monitor daily HL, CBC and s/sx of bleeding -F/U duration of anticoagulation  Arrie Senate, PharmD, BCPS PGY-2 Cardiology Pharmacy Resident Pager: [phone] 01/02/2017

## 2017-01-03 DIAGNOSIS — I10 Essential (primary) hypertension: Secondary | ICD-10-CM

## 2017-01-03 DIAGNOSIS — J111 Influenza due to unidentified influenza virus with other respiratory manifestations: Secondary | ICD-10-CM

## 2017-01-03 DIAGNOSIS — R071 Chest pain on breathing: Secondary | ICD-10-CM

## 2017-01-03 LAB — BASIC METABOLIC PANEL
Anion gap: 11 (ref 5–15)
BUN: 30 mg/dL — ABNORMAL HIGH (ref 6–20)
CALCIUM: 8.4 mg/dL — AB (ref 8.9–10.3)
CO2: 24 mmol/L (ref 22–32)
CREATININE: 2.64 mg/dL — AB (ref 0.44–1.00)
Chloride: 98 mmol/L — ABNORMAL LOW (ref 101–111)
GFR, EST AFRICAN AMERICAN: 22 mL/min — AB (ref >60)
GFR, EST NON AFRICAN AMERICAN: 19 mL/min — AB (ref >60)
Glucose, Bld: 137 mg/dL — ABNORMAL HIGH (ref 65–99)
Potassium: 3.5 mmol/L (ref 3.5–5.1)
SODIUM: 133 mmol/L — AB (ref 135–145)

## 2017-01-03 LAB — GLUCOSE, CAPILLARY
GLUCOSE-CAPILLARY: 168 mg/dL — AB (ref 65–99)
Glucose-Capillary: 123 mg/dL — ABNORMAL HIGH (ref 65–99)
Glucose-Capillary: 138 mg/dL — ABNORMAL HIGH (ref 65–99)
Glucose-Capillary: 145 mg/dL — ABNORMAL HIGH (ref 65–99)

## 2017-01-03 LAB — CBC
HEMATOCRIT: 32.2 % — AB (ref 36.0–46.0)
Hemoglobin: 10.3 g/dL — ABNORMAL LOW (ref 12.0–15.0)
MCH: 25.9 pg — ABNORMAL LOW (ref 26.0–34.0)
MCHC: 32 g/dL (ref 30.0–36.0)
MCV: 80.9 fL (ref 78.0–100.0)
PLATELETS: 154 10*3/uL (ref 150–400)
RBC: 3.98 MIL/uL (ref 3.87–5.11)
RDW: 14.9 % (ref 11.5–15.5)
WBC: 4.9 10*3/uL (ref 4.0–10.5)

## 2017-01-03 LAB — HEPARIN LEVEL (UNFRACTIONATED): HEPARIN UNFRACTIONATED: 0.46 [IU]/mL (ref 0.30–0.70)

## 2017-01-03 MED ORDER — ALPRAZOLAM 0.5 MG PO TABS
0.5000 mg | ORAL_TABLET | Freq: Once | ORAL | Status: AC
  Administered 2017-01-03: 0.5 mg via ORAL
  Filled 2017-01-03: qty 1

## 2017-01-03 MED ORDER — HEPARIN SODIUM (PORCINE) 5000 UNIT/ML IJ SOLN
5000.0000 [IU] | Freq: Three times a day (TID) | INTRAMUSCULAR | Status: DC
  Administered 2017-01-03 – 2017-01-06 (×10): 5000 [IU] via SUBCUTANEOUS
  Filled 2017-01-03 (×10): qty 1

## 2017-01-03 NOTE — Progress Notes (Signed)
Both PIV's will not flushed and removed. Attempted X 2 to get new piv but unable due to pt crying and pulling arm away. IV team consult placed and they were unable to get piv due to pt's behavior. MD notified

## 2017-01-03 NOTE — Progress Notes (Addendum)
Progress Note   Subjective   Remains quite ill.  + very fatigued.  Frequent cough.  Chest only hurts with cough.  + SOB with exertion.  Inpatient Medications    Scheduled Meds: . aspirin EC  81 mg Oral Daily  . atorvastatin  80 mg Oral Daily  . carvedilol  25 mg Oral BID  . cholecalciferol  1,000 Units Oral Daily  . clopidogrel  75 mg Oral Daily  . gabapentin  300 mg Oral TID  . guaiFENesin  600 mg Oral BID  . hydrALAZINE  50 mg Oral BID  . insulin aspart  0-15 Units Subcutaneous TID WC  . isosorbide mononitrate  60 mg Oral Daily  . oseltamivir  30 mg Oral Daily  . oxymetazoline  1 spray Each Nare Once  . pneumococcal 23 valent vaccine  0.5 mL Intramuscular Tomorrow-1000   Continuous Infusions: . cefTRIAXone (ROCEPHIN)  IV Stopped (01/02/17 1355)  . heparin 1,000 Units/hr (01/02/17 1900)   PRN Meds: ipratropium-albuterol, morphine injection, nitroGLYCERIN, traMADol   Vital Signs    Vitals:   01/02/17 2004 01/02/17 2323 01/03/17 0335 01/03/17 0753  BP: 121/60 (!) 150/73 (!) 141/66 (!) 151/76  Pulse: 65 (!) 56 60 61  Resp: 19 16 15 18   Temp: 98.8 F (37.1 C) 98.8 F (37.1 C) 98.9 F (37.2 C) 98.6 F (37 C)  TempSrc: Oral Oral Oral Oral  SpO2: 99% 99% 100% 98%  Weight:      Height:        Intake/Output Summary (Last 24 hours) at 01/03/2017 1028 Last data filed at 01/03/2017 2878 Gross per 24 hour  Intake 1220 ml  Output 1100 ml  Net 120 ml   Filed Weights   01/01/17 0909 01/01/17 1635  Weight: 201 lb (91.2 kg) 202 lb 13.2 oz (92 kg)    Telemetry    Sinus rhythm - Personally Reviewed  Physical Exam   GEN- The patient is ill appearing, alert and oriented x 3 today.   Head- normocephalic, atraumatic Eyes-  Sclera clear, conjunctiva pink Ears- hearing intact Oropharynx- clear Neck- supple, Lungs- tachypneic Heart- Regular rate and rhythm  GI- soft  Extremities- no clubbing, cyanosis, or edema  MS- no significant deformity or atrophy Skin- no  rash or lesion Psych- flat affect Neuro- strength and sensation are intact   Labs    Chemistry Recent Labs  Lab 01/01/17 0917 01/02/17 0857 01/03/17 0746  NA 139 133* 133*  K 3.1* 3.5 3.5  CL 102 101 98*  CO2 25 20* 24  GLUCOSE 168* 135* 137*  BUN 25* 28* 30*  CREATININE 2.10* 2.62* 2.64*  CALCIUM 9.3 8.5* 8.4*  PROT 7.8  --   --   ALBUMIN 3.7  --   --   AST 33  --   --   ALT 22  --   --   ALKPHOS 105  --   --   BILITOT 0.6  --   --   GFRNONAA 25* 19* 19*  GFRAA 29* 22* 22*  ANIONGAP 12 12 11      Hematology Recent Labs  Lab 01/01/17 0917 01/02/17 0857 01/03/17 0254  WBC 11.3* 6.8 4.9  RBC 4.99 4.44 3.98  HGB 12.9 11.4* 10.3*  HCT 39.1 35.7* 32.2*  MCV 78.4 80.4 80.9  MCH 25.9* 25.7* 25.9*  MCHC 33.0 31.9 32.0  RDW 14.9 14.8 14.9  PLT 234 178 154    Cardiac Enzymes Recent Labs  Lab 01/01/17 1311 01/01/17 1738 01/01/17 2311  01/02/17 0857  TROPONINI 4.90* 4.26* 4.34* 3.58*   No results for input(s): TROPIPOC in the last 168 hours.      Assessment & Plan    1.  NSTEMI Not a candidate for intervention at this time Supportive management in the setting of acute medical illness/ flu Stop heparin drip today Continue on ASA, plavix, beat blocker, imdur, statin  2. PVD On ASA and plavix  3. HTN Stable No change required today  4. Acute on chronic renal insufficiency Not a cath candidate Hopefully will improve as she clinically improves from flu  She remains quite ill with the flu.  Prognosis is guarded  Thompson Grayer MD, [facility] 01/03/2017 10:28 AM

## 2017-01-03 NOTE — Progress Notes (Signed)
Attempted to call report to 3E.  Nurse unable to take report at this time, name and number left for return call.

## 2017-01-03 NOTE — Plan of Care (Signed)
  Elimination: Will not experience complications related to urinary retention 01/03/2017 2153 - Progressing by Rolm Baptise, RN   Safety: Ability to remain free from injury will improve 01/03/2017 2153 - Progressing by Rolm Baptise, RN

## 2017-01-03 NOTE — Progress Notes (Signed)
Pt given xanax per MD order and new PIV attempted once pt was relaxed. Pt continues to jerk arm away and cry.

## 2017-01-03 NOTE — Progress Notes (Signed)
PROGRESS NOTE    Rebekah Johnson  MBW:[id number] DOB: 1960/05/23 DOA: 01/01/2017 PCP: Arnoldo Morale, MD  Brief Narrative:Rebekah Johnson is a 56 y.o. female  H/o noninsulin dependent dm2, HTN,  h/o pvd status postleft SFA and popliteal artery stent and left SFA PTA with drug-coated balloonin 08/2015, CAD with severe 2 V CAD from cardiac cath 09/2016 on medical management, h/o diastolic chf, ckd III. She was just evaluated by pmd on 12/27, she presented to [facility] ED due to c/o shortness of breath and central to left chest pain since last night. Reports nausea and 2 episodes of vomiting. She is found to have fever 101.7, + fluA, + mild right mid lobe infiltrates , troponin elevated, she has hypoxia and tachypnea, blood culture obtained, she is given vanc/zosyn in the Ed, she is started on bipap and transfered to [place] stepdown.  Assessment & Plan:     ACute hypoxic resp failure -Due to influenza/pneumonia -Improving, continue Tamiflu, wean oxygen -Off BiPAP -Follow-up blood cultures with GPC strep in one out of 2 likely contaminant, continue IV ceftriaxone until clinical picture becomes more clear    NSTEMI (non-ST elevated myocardial infarction) ([place]) -h/o severe 2vessel CAD on medical management -trop significantly elevated in setting of hypoxia/flu -stop heparin -Cards consult appreciate, not candidate for invasive Rx -Continue aspirin/Plavix, Coreg, statin and Imdur  Chronic diastolic CHF -Recently admitted in September for diastolic CHF and NSTEMI -Diuresed during that hospitalization reportedly takes 40 mg by mouth twice a day of Lasix at home although it's not on her medication list -Hold Lasix today, restart in 1-2 days  hypokalemia Likely from vomiting and taking lasix Replaced  AKi on CKD3 -Cr 2.1 on admission, baseline appear to be at 1.6-1.7 -creatinine has trended up to 2.6 likely low-grade sepsis from flu , monitor, holding diuretics  -suspect some degree of  cardiorenal syndrome from NSTEMI too contributing -monitor, followed by [facility]  Noninsulin dependent dm2 -a1c 7.8 -continue SSI  HTN: continue coreg/imdur/hydralzine -cut down hydralazine  HLD: continue statin  Obesity: Body mass index is 34.81 kg/m.  DVT prophylaxis: currently on heparin drip  Consultants: cardiology Code Status: full  Family Communication:  Patient   Disposition Plan:  transfer to telemetry   Consultants:   Cardiology   Procedures:   Antimicrobials:  Antibiotics Given (last 72 hours)    Date/Time Action Medication Dose Rate   01/01/17 1004 New Bag/Given   piperacillin-tazobactam (ZOSYN) IVPB 3.375 g 3.375 g 100 mL/hr   01/01/17 1038 New Bag/Given   vancomycin (VANCOCIN) 2,000 mg in sodium chloride 0.9 % 500 mL IVPB 2,000 mg 250 mL/hr   01/01/17 1838 Given   oseltamivir (TAMIFLU) capsule 75 mg 75 mg    01/01/17 2227 Given   oseltamivir (TAMIFLU) capsule 30 mg 30 mg    01/02/17 0177 Given   oseltamivir (TAMIFLU) capsule 30 mg 30 mg    01/02/17 1317 New Bag/Given   cefTRIAXone (ROCEPHIN) 2 g in dextrose 5 % 50 mL IVPB 2 g 100 mL/hr   01/03/17 0840 Given   oseltamivir (TAMIFLU) capsule 30 mg 30 mg       Subjective: -Feels weak overall, breathing improving, continues to have painful cough and congestion  Objective: Vitals:   01/02/17 2323 01/03/17 0335 01/03/17 0753 01/03/17 1211  BP: (!) 150/73 (!) 141/66 (!) 151/76 136/67  Pulse: (!) 56 60 61 (!) 59  Resp: 16 15 18 16   Temp: 98.8 F (37.1 C) 98.9 F (37.2 C) 98.6 F (37 C)  99 F (37.2 C)  TempSrc: Oral Oral Oral Oral  SpO2: 99% 100% 98% 99%  Weight:      Height:        Intake/Output Summary (Last 24 hours) at 01/03/2017 1302 Last data filed at 01/03/2017 0905 Gross per 24 hour  Intake 1190 ml  Output 1100 ml  Net 90 ml   Filed Weights   01/01/17 0909 01/01/17 1635  Weight: 91.2 kg (201 lb) 92 kg (202 lb 13.2 oz)    Examination:  Gen: Awake,  Alert, Oriented X 3, ill-appearing HEENT: PERRLA, Neck supple, no JVD Lungs: Few basilar rhonchi CVS: RRR,No Gallops,Rubs or new Murmurs Abd: soft, Non tender, non distended, BS present Extremities: No Cyanosis, Clubbing or edema Skin: no new rashes   Data Reviewed:   CBC: Recent Labs  Lab 01/01/17 0917 01/02/17 0857 01/03/17 0254  WBC 11.3* 6.8 4.9  NEUTROABS 9.2*  --   --   HGB 12.9 11.4* 10.3*  HCT 39.1 35.7* 32.2*  MCV 78.4 80.4 80.9  PLT 234 178 761   Basic Metabolic Panel: Recent Labs  Lab 01/01/17 0917 01/01/17 2311 01/02/17 0857 01/03/17 0746  NA 139  --  133* 133*  K 3.1*  --  3.5 3.5  CL 102  --  101 98*  CO2 25  --  20* 24  GLUCOSE 168*  --  135* 137*  BUN 25*  --  28* 30*  CREATININE 2.10*  --  2.62* 2.64*  CALCIUM 9.3  --  8.5* 8.4*  MG  --  1.7  --   --    GFR: Estimated Creatinine Clearance: 25.5 mL/min (A) (by C-G formula based on SCr of 2.64 mg/dL (H)). Liver Function Tests: Recent Labs  Lab 01/01/17 0917  AST 33  ALT 22  ALKPHOS 105  BILITOT 0.6  PROT 7.8  ALBUMIN 3.7   No results for input(s): LIPASE, AMYLASE in the last 168 hours. No results for input(s): AMMONIA in the last 168 hours. Coagulation Profile: No results for input(s): INR, PROTIME in the last 168 hours. Cardiac Enzymes: Recent Labs  Lab 01/01/17 0917 01/01/17 1311 01/01/17 1738 01/01/17 2311 01/02/17 0857  TROPONINI 2.04* 4.90* 4.26* 4.34* 3.58*   BNP (last 3 results) No results for input(s): PROBNP in the last 8760 hours. HbA1C: No results for input(s): HGBA1C in the last 72 hours. CBG: Recent Labs  Lab 01/02/17 1153 01/02/17 1735 01/02/17 2131 01/03/17 0819 01/03/17 1214  GLUCAP 189* 123* 156* 123* 138*   Lipid Profile: No results for input(s): CHOL, HDL, LDLCALC, TRIG, CHOLHDL, LDLDIRECT in the last 72 hours. Thyroid Function Tests: No results for input(s): TSH, T4TOTAL, FREET4, T3FREE, THYROIDAB in the last 72 hours. Anemia Panel: No results  for input(s): VITAMINB12, FOLATE, FERRITIN, TIBC, IRON, RETICCTPCT in the last 72 hours. Urine analysis:    Component Value Date/Time   COLORURINE YELLOW 01/02/2017 0037   APPEARANCEUR HAZY (A) 01/02/2017 0037   LABSPEC 1.017 01/02/2017 0037   PHURINE 5.0 01/02/2017 0037   GLUCOSEU 50 (A) 01/02/2017 0037   HGBUR SMALL (A) 01/02/2017 0037   BILIRUBINUR NEGATIVE 01/02/2017 0037   KETONESUR NEGATIVE 01/02/2017 0037   PROTEINUR >=300 (A) 01/02/2017 0037   NITRITE NEGATIVE 01/02/2017 0037   LEUKOCYTESUR NEGATIVE 01/02/2017 0037   Sepsis Labs: @LABRCNTIP (procalcitonin:4,lacticidven:4)  ) Recent Results (from the past 240 hour(s))  Blood culture (routine x 2)     Status: None (Preliminary result)   Collection Time: 01/01/17  9:45 AM  Result Value Ref  Range Status   Specimen Description BLOOD LEFT ANTECUBITAL  Final   Special Requests   Final    BOTTLES DRAWN AEROBIC AND ANAEROBIC Blood Culture adequate volume   Culture   Final    NO GROWTH 1 DAY Performed at [facility], [address], [place], [place] [address]    Report Status PENDING  Incomplete  Blood culture (routine x 2)     Status: None (Preliminary result)   Collection Time: 01/01/17  9:55 AM  Result Value Ref Range Status   Specimen Description BLOOD RIGHT ANTECUBITAL  Final   Special Requests   Final    BOTTLES DRAWN AEROBIC AND ANAEROBIC Blood Culture adequate volume   Culture  Setup Time   Final    GRAM POSITIVE COCCI IN CHAINS ANAEROBIC BOTTLE ONLY CRITICAL RESULT CALLED TO, READ BACK BY AND VERIFIED WITHRich Fuchs [id number] [id number] 01/02/17 A BROWNING Performed at [facility], [place] [address], [place], [place] [address]    Culture GRAM POSITIVE COCCI  Final   Report Status PENDING  Incomplete  Blood Culture ID Panel (Reflexed)     Status: Abnormal   Collection Time: 01/01/17  9:55 AM  Result Value Ref Range Status   Enterococcus species NOT DETECTED NOT DETECTED Final   Listeria monocytogenes NOT  DETECTED NOT DETECTED Final   Staphylococcus species NOT DETECTED NOT DETECTED Final   Staphylococcus aureus NOT DETECTED NOT DETECTED Final   Streptococcus species DETECTED (A) NOT DETECTED Final    Comment: Not Enterococcus species, Streptococcus agalactiae, Streptococcus pyogenes, or Streptococcus pneumoniae. CRITICAL RESULT CALLED TO, READ BACK BY AND VERIFIED WITH: Rich Fuchs PHARMD [id number] 01/02/17 A BROWNING    Streptococcus agalactiae NOT DETECTED NOT DETECTED Final   Streptococcus pneumoniae NOT DETECTED NOT DETECTED Final   Streptococcus pyogenes NOT DETECTED NOT DETECTED Final   Acinetobacter baumannii NOT DETECTED NOT DETECTED Final   Enterobacteriaceae species NOT DETECTED NOT DETECTED Final   Enterobacter cloacae complex NOT DETECTED NOT DETECTED Final   Escherichia coli NOT DETECTED NOT DETECTED Final   Klebsiella oxytoca NOT DETECTED NOT DETECTED Final   Klebsiella pneumoniae NOT DETECTED NOT DETECTED Final   Proteus species NOT DETECTED NOT DETECTED Final   Serratia marcescens NOT DETECTED NOT DETECTED Final   Haemophilus influenzae NOT DETECTED NOT DETECTED Final   Neisseria meningitidis NOT DETECTED NOT DETECTED Final   Pseudomonas aeruginosa NOT DETECTED NOT DETECTED Final   Candida albicans NOT DETECTED NOT DETECTED Final   Candida glabrata NOT DETECTED NOT DETECTED Final   Candida krusei NOT DETECTED NOT DETECTED Final   Candida parapsilosis NOT DETECTED NOT DETECTED Final   Candida tropicalis NOT DETECTED NOT DETECTED Final    Comment: Performed at [facility], [place], [place], [place] [address]  MRSA PCR Screening     Status: None   Collection Time: 01/01/17  5:36 PM  Result Value Ref Range Status   MRSA by PCR NEGATIVE NEGATIVE Final    Comment:        The GeneXpert MRSA Assay (FDA approved for NASAL specimens only), is one component of a comprehensive MRSA colonization surveillance program. It is not intended to diagnose MRSA infection nor to  guide or monitor treatment for MRSA infections.          Radiology Studies: Dg Chest Port 1 View  Result Date: 01/02/2017 CLINICAL DATA:  Shortness of breath and chest pain for 2 days EXAM: PORTABLE CHEST 1 VIEW COMPARISON:  January 01, 2017 FINDINGS: The heart size and mediastinal contours are stable. There is mild pulmonary edema. There is no focal pneumonia or pleural effusion. The previously noted consolidation of the lateral right mid lung is not seen today. The visualized skeletal structures are stable. IMPRESSION: Mild pulmonary edema. The previously noted opacity of the right lateral mid lung has resolved. Electronically Signed   By: Abelardo Diesel M.D.   On: 01/02/2017 10:12        Scheduled Meds: . aspirin EC  81 mg Oral Daily  . atorvastatin  80 mg Oral Daily  . carvedilol  25 mg Oral BID  . cholecalciferol  1,000 Units Oral Daily  . clopidogrel  75 mg Oral Daily  . gabapentin  300 mg Oral TID  . guaiFENesin  600 mg Oral BID  . heparin injection (subcutaneous)  5,000 Units Subcutaneous Q8H  . hydrALAZINE  50 mg Oral BID  . insulin aspart  0-15 Units Subcutaneous TID WC  . isosorbide mononitrate  60 mg Oral Daily  . oseltamivir  30 mg Oral Daily  . oxymetazoline  1 spray Each Nare Once  . pneumococcal 23 valent vaccine  0.5 mL Intramuscular Tomorrow-1000   Continuous Infusions: . cefTRIAXone (ROCEPHIN)  IV Stopped (01/02/17 1355)     LOS: 2 days    Time spent: 79min    Domenic Polite, MD [facility] Page via [email or web address], password [email or web address] After 7PM please contact night-coverage  01/03/2017, 1:02 PM

## 2017-01-04 ENCOUNTER — Inpatient Hospital Stay (HOSPITAL_COMMUNITY): Payer: Medicaid Other

## 2017-01-04 LAB — CULTURE, BLOOD (ROUTINE X 2): Special Requests: ADEQUATE

## 2017-01-04 LAB — EXPECTORATED SPUTUM ASSESSMENT W GRAM STAIN, RFLX TO RESP C

## 2017-01-04 LAB — GLUCOSE, CAPILLARY
GLUCOSE-CAPILLARY: 130 mg/dL — AB (ref 65–99)
GLUCOSE-CAPILLARY: 129 mg/dL — AB (ref 65–99)
Glucose-Capillary: 209 mg/dL — ABNORMAL HIGH (ref 65–99)
Glucose-Capillary: 164 mg/dL — ABNORMAL HIGH (ref 65–99)

## 2017-01-04 LAB — BASIC METABOLIC PANEL
ANION GAP: 10 (ref 5–15)
BUN: 29 mg/dL — ABNORMAL HIGH (ref 6–20)
CALCIUM: 8.4 mg/dL — AB (ref 8.9–10.3)
CO2: 24 mmol/L (ref 22–32)
CREATININE: 2.54 mg/dL — AB (ref 0.44–1.00)
Chloride: 98 mmol/L — ABNORMAL LOW (ref 101–111)
GFR, EST AFRICAN AMERICAN: 23 mL/min — AB (ref >60)
GFR, EST NON AFRICAN AMERICAN: 20 mL/min — AB (ref >60)
Glucose, Bld: 131 mg/dL — ABNORMAL HIGH (ref 65–99)
Potassium: 3.6 mmol/L (ref 3.5–5.1)
SODIUM: 132 mmol/L — AB (ref 135–145)

## 2017-01-04 LAB — CBC
HCT: 32.8 % — ABNORMAL LOW (ref 36.0–46.0)
Hemoglobin: 10.6 g/dL — ABNORMAL LOW (ref 12.0–15.0)
MCH: 26.2 pg (ref 26.0–34.0)
MCHC: 32.3 g/dL (ref 30.0–36.0)
MCV: 81 fL (ref 78.0–100.0)
PLATELETS: 148 10*3/uL — AB (ref 150–400)
RBC: 4.05 MIL/uL (ref 3.87–5.11)
RDW: 14.8 % (ref 11.5–15.5)
WBC: 5.6 10*3/uL (ref 4.0–10.5)

## 2017-01-04 LAB — EXPECTORATED SPUTUM ASSESSMENT W REFEX TO RESP CULTURE

## 2017-01-04 MED ORDER — ACETAMINOPHEN 325 MG PO TABS
650.0000 mg | ORAL_TABLET | Freq: Four times a day (QID) | ORAL | Status: DC | PRN
  Administered 2017-01-05: 650 mg via ORAL
  Filled 2017-01-04: qty 2

## 2017-01-04 MED ORDER — SENNOSIDES-DOCUSATE SODIUM 8.6-50 MG PO TABS
1.0000 | ORAL_TABLET | Freq: Two times a day (BID) | ORAL | Status: DC
  Administered 2017-01-04 – 2017-01-06 (×5): 1 via ORAL
  Filled 2017-01-04 (×5): qty 1

## 2017-01-04 MED ORDER — HYDRALAZINE HCL 50 MG PO TABS
75.0000 mg | ORAL_TABLET | Freq: Two times a day (BID) | ORAL | Status: DC
  Administered 2017-01-04 – 2017-01-06 (×4): 75 mg via ORAL
  Filled 2017-01-04 (×4): qty 1

## 2017-01-04 NOTE — Progress Notes (Signed)
Progress Note  Patient Name: Rebekah Johnson Date of Encounter: 01/04/2017  Primary Cardiologist: Cassia has improved today, but still fatigued with little activity. No chest pain.   Inpatient Medications    Scheduled Meds: . aspirin EC  81 mg Oral Daily  . atorvastatin  80 mg Oral Daily  . carvedilol  25 mg Oral BID  . cholecalciferol  1,000 Units Oral Daily  . clopidogrel  75 mg Oral Daily  . gabapentin  300 mg Oral TID  . guaiFENesin  600 mg Oral BID  . heparin injection (subcutaneous)  5,000 Units Subcutaneous Q8H  . hydrALAZINE  50 mg Oral BID  . insulin aspart  0-15 Units Subcutaneous TID WC  . isosorbide mononitrate  60 mg Oral Daily  . oseltamivir  30 mg Oral Daily  . oxymetazoline  1 spray Each Nare Once  . pneumococcal 23 valent vaccine  0.5 mL Intramuscular Tomorrow-1000   Continuous Infusions: . cefTRIAXone (ROCEPHIN)  IV Stopped (01/03/17 1512)   PRN Meds: ipratropium-albuterol, morphine injection, nitroGLYCERIN, traMADol   Vital Signs    Vitals:   01/03/17 2147 01/04/17 0449 01/04/17 0555 01/04/17 0809  BP: (!) 170/70  138/61 (!) 156/70  Pulse: 68  60 60  Resp: 18     Temp: 99.3 F (37.4 C)  99.3 F (37.4 C)   TempSrc: Oral  Oral   SpO2: 97%  98% 97%  Weight: 198 lb 6.4 oz (90 kg) 198 lb 3.2 oz (89.9 kg)    Height: 5\' 4"  (1.626 m)       Intake/Output Summary (Last 24 hours) at 01/04/2017 1791 Last data filed at 01/04/2017 0400 Gross per 24 hour  Intake 1130 ml  Output 1000 ml  Net 130 ml   Filed Weights   01/01/17 1635 01/03/17 2147 01/04/17 0449  Weight: 202 lb 13.2 oz (92 kg) 198 lb 6.4 oz (90 kg) 198 lb 3.2 oz (89.9 kg)    Telemetry    SR - Personally Reviewed  Physical Exam   General: AA female appearing in no acute distress. Head: Normocephalic, atraumatic.  Neck: Supple without bruits, JVD. Lungs:  Resp regular and unlabored, Diminished in bases. Heart: RRR, S1, S2, no S3, S4, or murmur; no  rub. Abdomen: Soft, non-tender, non-distended with normoactive bowel sounds. No hepatomegaly. No rebound/guarding. No obvious abdominal masses. Extremities: No clubbing, cyanosis, edema. Distal pedal pulses are 2+ bilaterally. Neuro: Alert and oriented X 3. Moves all extremities spontaneously. Psych: Normal affect.  Labs    Chemistry Recent Labs  Lab 01/01/17 [phone] 01/02/17 0857 01/03/17 0746 01/04/17 0551  NA 139 133* 133* 132*  K 3.1* 3.5 3.5 3.6  CL 102 101 98* 98*  CO2 25 20* 24 24  GLUCOSE 168* 135* 137* 131*  BUN 25* 28* 30* 29*  CREATININE 2.10* 2.62* 2.64* 2.54*  CALCIUM 9.3 8.5* 8.4* 8.4*  PROT 7.8  --   --   --   ALBUMIN 3.7  --   --   --   AST 33  --   --   --   ALT 22  --   --   --   ALKPHOS 105  --   --   --   BILITOT 0.6  --   --   --   GFRNONAA 25* 19* 19* 20*  GFRAA 29* 22* 22* 23*  ANIONGAP 12 12 11 10      Hematology Recent Labs  Lab 01/02/17 0857 01/03/17 0254 01/04/17  0551  WBC 6.8 4.9 5.6  RBC 4.44 3.98 4.05  HGB 11.4* 10.3* 10.6*  HCT 35.7* 32.2* 32.8*  MCV 80.4 80.9 81.0  MCH 25.7* 25.9* 26.2  MCHC 31.9 32.0 32.3  RDW 14.8 14.9 14.8  PLT 178 154 148*    Cardiac Enzymes Recent Labs  Lab 01/01/17 1311 01/01/17 1738 01/01/17 2311 01/02/17 0857  TROPONINI 4.90* 4.26* 4.34* 3.58*   No results for input(s): TROPIPOC in the last 168 hours.   BNP Recent Labs  Lab 01/01/17 0917  BNP 962.9*     DDimer No results for input(s): DDIMER in the last 168 hours.    Radiology    Dg Chest 2 View  Result Date: 01/04/2017 CLINICAL DATA:  Follow-up of pneumonia EXAM: CHEST  2 VIEW COMPARISON:  Portable chest x-ray of January 02, 2017 FINDINGS: The lungs are well-expanded. There patchy areas of increased density at the left lung base. There is no alveolar infiltrate or pleural effusion. The heart and pulmonary vascularity are normal. There is calcification in the wall of the aortic arch. There is multilevel degenerative disc disease of the  thoracic spine. IMPRESSION: Patchy pneumonia in the left lower lobe. No significant abnormality on the right. No CHF. Thoracic aortic atherosclerosis. Electronically Signed   By: Rosina Cressler  [place] M.D.   On: 01/04/2017 07:25    Cardiac Studies   N/a   Patient Profile     56 y.o. female  with past medical history of PVD (s/p left SFA and popliteal stenting in 08/2015), HTN, HLD, Type 2 DM, and Stage 3 CKD who presented with chest pain and positive troponins.   Assessment & Plan    1. NSTEMI/ CAD -  cardiac catheterization on 09/17/2016 showed severe disease of the RCA (diffuse 80% stenosis), 80% RI2 stenosis, and 100% stenosis of LCx. PCI was not performed due to the fact she would require multiple stents and significant contrast use. Given her CKD, no plans for intervention.  - troponin peaked at 4.90. Heparin was stopped yesterday. Remains on medical therapy with ASA, statin, plavix, BB, Imdur  2. PVD - s/p left SFA and popliteal stenting in 08/2015.  - remains on ASA, Plavix, and statin therapy.   3. Accelerated HTN -  BP has been labile. Will further increase hydralazine.   4. HLD - Lipid Panel in 09/2016 showed total cholesterol of 93, HDL 25, and LDL 35. At goal of LDL < 70. - continue Atorvastatin 80mg  daily.   5. Acute on Chronic Stage 3 CKD - creatinine 2.54 (baseline 1.6 - 1.7).  - continue to follow.   6. PNA/ Influenza A - on antibiotics and Tamiflu.  - per admitting team.   Remains weak, but per her report is improving. Would benefit from PT evaluation.    Signed, Reino Bellis, NP  01/04/2017, 9:58 AM  Pager # [phone]   For questions or updates, please contact [place] Please consult [email or web address] for contact info under Cardiology/STEMI.   I have seen, examined and evaluated the patient this AM along with Reino Bellis, NP-C.  After reviewing all the available data and chart, we discussed the patients laboratory, study & physical findings as  well as symptoms in detail. I agree with her findings, examination as well as impression recommendations as per our discussion.    Overall she feels much better.  Now only has chest pain with coughing.  However the coughing is notably improved.  Her breathing is notably improved.  No active angina or heart  failure symptoms. Has completed her course of IV heparin.  Continue oral medications for medical management of existing CAD.  Not a PCI candidate, especially in light of her worsening renal function.  No plans for invasive evaluation unless she significantly worsening symptoms.   Glenetta Hew, M.D., M.S. Interventional Cardiologist   Pager # [phone] Phone # [phone] [address] [place], [place] [address]

## 2017-01-04 NOTE — Progress Notes (Signed)
PROGRESS NOTE    Rebekah Johnson  ZOX:[id number] DOB: 1960-01-22 DOA: 01/01/2017 PCP: Arnoldo Morale, MD  Brief Narrative:Rebekah Johnson is a 56 y.o. female  H/o noninsulin dependent dm2, HTN,  h/o pvd status postleft SFA and popliteal artery stent and left SFA PTA with drug-coated balloonin 08/2015, CAD with severe 2 V CAD from cardiac cath 09/2016 on medical management, h/o diastolic chf, ckd III. She was just evaluated by pmd on 12/27, she presented to [facility] ED due to c/o shortness of breath and central to left chest pain since last night. Reports nausea and 2 episodes of vomiting. She is found to have fever 101.7, + fluA, + mild right mid lobe infiltrates , troponin elevated, she has hypoxia and tachypnea, blood culture obtained, she is given vanc/zosyn in the Ed, she is started on bipap and transfered to  stepdown.  Assessment & Plan:     ACute hypoxic resp failure -Due to influenza/pneumonia -Improving, continue Tamiflu, wean oxygen -Off BiPAP -Follow-up blood cultures with strep viridans in one out of 2, most likely contaminant, will stop IV ceftriaxone    NSTEMI (non-ST elevated myocardial infarction) (HCC) -h/o severe 2vessel CAD on medical management -trop significantly elevated in setting of hypoxia/flu -stopped heparin -Cards consult appreciated, not candidate for invasive Rx -Continue aspirin/Plavix, Coreg, statin and Imdur  Chronic diastolic CHF -Recently admitted in September for diastolic CHF and NSTEMI -Diuresed during that hospitalization reportedly takes 40 mg by mouth twice a day of Lasix at home although it's not on her medication list -Restart Lasix tomorrow  hypokalemia Likely from vomiting and taking lasix Replaced  AKi on CKD3 -Cr 2.1 on admission, baseline appear to be at 1.6-1.7 -creatinine has trended up to 2.6 likely low-grade sepsis from flu , monitor, holding diuretics  -suspect some degree of cardiorenal syndrome from NSTEMI too  contributing -Finally plateauing and starting to improve, monitor  Noninsulin dependent dm2 -a1c 7.8 -continue SSI  HTN: continue coreg/imdur/hydralzine -cut down hydralazine  HLD: continue statin  Obesity: Body mass index is 34.81 kg/m.  DVT prophylaxis:  heparin  Consultants: cardiology Code Status: full  Family Communication:  Patient   Disposition Plan:  home in 1-2 days if stable   Consultants:   Cardiology   Procedures:   Antimicrobials:  Antibiotics Given (last 72 hours)    Date/Time Action Medication Dose Rate   01/01/17 1838 Given   oseltamivir (TAMIFLU) capsule 75 mg 75 mg    01/01/17 2227 Given   oseltamivir (TAMIFLU) capsule 30 mg 30 mg    01/02/17 8119 Given   oseltamivir (TAMIFLU) capsule 30 mg 30 mg    01/02/17 1317 New Bag/Given   cefTRIAXone (ROCEPHIN) 2 g in dextrose 5 % 50 mL IVPB 2 g 100 mL/hr   01/03/17 0840 Given   oseltamivir (TAMIFLU) capsule 30 mg 30 mg    01/03/17 1338 New Bag/Given   cefTRIAXone (ROCEPHIN) 2 g in dextrose 5 % 50 mL IVPB 2 g 100 mL/hr   01/04/17 1478 Given   oseltamivir (TAMIFLU) capsule 30 mg 30 mg       Subjective: -Continues to feel better, breathing improving, less cough or congestion  Objective: Vitals:   01/03/17 2147 01/04/17 0449 01/04/17 0555 01/04/17 0809  BP: (!) 170/70  138/61 (!) 156/70  Pulse: 68  60 60  Resp: 18     Temp: 99.3 F (37.4 C)  99.3 F (37.4 C)   TempSrc: Oral  Oral   SpO2: 97%  98% 97%  Weight: 90  kg (198 lb 6.4 oz) 89.9 kg (198 lb 3.2 oz)    Height: 5\' 4"  (1.626 m)       Intake/Output Summary (Last 24 hours) at 01/04/2017 1200 Last data filed at 01/04/2017 0959 Gross per 24 hour  Intake 1130 ml  Output 1450 ml  Net -320 ml   Filed Weights   01/01/17 1635 01/03/17 2147 01/04/17 0449  Weight: 92 kg (202 lb 13.2 oz) 90 kg (198 lb 6.4 oz) 89.9 kg (198 lb 3.2 oz)    Examination:  Gen: Awake, Alert, Oriented X 3, chronically ill-appearing HEENT: PERRLA,  Neck supple, no JVD Lungs: Scattered rhonchi CVS: RRR,No Gallops,Rubs or new Murmurs Abd: soft, Non tender, non distended, BS present Extremities: No edema Skin: no new rashes  Data Reviewed:   CBC: Recent Labs  Lab 01/01/17 0917 01/02/17 0857 01/03/17 0254 01/04/17 0551  WBC 11.3* 6.8 4.9 5.6  NEUTROABS 9.2*  --   --   --   HGB 12.9 11.4* 10.3* 10.6*  HCT 39.1 35.7* 32.2* 32.8*  MCV 78.4 80.4 80.9 81.0  PLT 234 178 154 109*   Basic Metabolic Panel: Recent Labs  Lab 01/01/17 0917 01/01/17 2311 01/02/17 0857 01/03/17 0746 01/04/17 0551  NA 139  --  133* 133* 132*  K 3.1*  --  3.5 3.5 3.6  CL 102  --  101 98* 98*  CO2 25  --  20* 24 24  GLUCOSE 168*  --  135* 137* 131*  BUN 25*  --  28* 30* 29*  CREATININE 2.10*  --  2.62* 2.64* 2.54*  CALCIUM 9.3  --  8.5* 8.4* 8.4*  MG  --  1.7  --   --   --    GFR: Estimated Creatinine Clearance: 26.2 mL/min (A) (by C-G formula based on SCr of 2.54 mg/dL (H)). Liver Function Tests: Recent Labs  Lab 01/01/17 0917  AST 33  ALT 22  ALKPHOS 105  BILITOT 0.6  PROT 7.8  ALBUMIN 3.7   No results for input(s): LIPASE, AMYLASE in the last 168 hours. No results for input(s): AMMONIA in the last 168 hours. Coagulation Profile: No results for input(s): INR, PROTIME in the last 168 hours. Cardiac Enzymes: Recent Labs  Lab 01/01/17 0917 01/01/17 1311 01/01/17 1738 01/01/17 2311 01/02/17 0857  TROPONINI 2.04* 4.90* 4.26* 4.34* 3.58*   BNP (last 3 results) No results for input(s): PROBNP in the last 8760 hours. HbA1C: No results for input(s): HGBA1C in the last 72 hours. CBG: Recent Labs  Lab 01/03/17 0819 01/03/17 1214 01/03/17 1801 01/03/17 2135 01/04/17 0729  GLUCAP 123* 138* 145* 168* 130*   Lipid Profile: No results for input(s): CHOL, HDL, LDLCALC, TRIG, CHOLHDL, LDLDIRECT in the last 72 hours. Thyroid Function Tests: No results for input(s): TSH, T4TOTAL, FREET4, T3FREE, THYROIDAB in the last 72  hours. Anemia Panel: No results for input(s): VITAMINB12, FOLATE, FERRITIN, TIBC, IRON, RETICCTPCT in the last 72 hours. Urine analysis:    Component Value Date/Time   COLORURINE YELLOW 01/02/2017 0037   APPEARANCEUR HAZY (A) 01/02/2017 0037   LABSPEC 1.017 01/02/2017 0037   PHURINE 5.0 01/02/2017 0037   GLUCOSEU 50 (A) 01/02/2017 0037   HGBUR SMALL (A) 01/02/2017 0037   BILIRUBINUR NEGATIVE 01/02/2017 0037   KETONESUR NEGATIVE 01/02/2017 0037   PROTEINUR >=300 (A) 01/02/2017 0037   NITRITE NEGATIVE 01/02/2017 0037   LEUKOCYTESUR NEGATIVE 01/02/2017 0037   Sepsis Labs: @LABRCNTIP (procalcitonin:4,lacticidven:4)  ) Recent Results (from the past 240 hour(s))  Blood culture (  routine x 2)     Status: None (Preliminary result)   Collection Time: 01/01/17  9:45 AM  Result Value Ref Range Status   Specimen Description BLOOD LEFT ANTECUBITAL  Final   Special Requests   Final    BOTTLES DRAWN AEROBIC AND ANAEROBIC Blood Culture adequate volume   Culture   Final    NO GROWTH 2 DAYS Performed at [facility], [address] [place], [place] [address]    Report Status PENDING  Incomplete  Blood culture (routine x 2)     Status: Abnormal   Collection Time: 01/01/17  9:55 AM  Result Value Ref Range Status   Specimen Description BLOOD RIGHT ANTECUBITAL  Final   Special Requests   Final    BOTTLES DRAWN AEROBIC AND ANAEROBIC Blood Culture adequate volume   Culture  Setup Time   Final    GRAM POSITIVE COCCI IN CHAINS ANAEROBIC BOTTLE ONLY CRITICAL RESULT CALLED TO, READ BACK BY AND VERIFIED WITH: Rich Fuchs [id number] [id number] 01/02/17 A BROWNING    Culture (A)  Final    VIRIDANS STREPTOCOCCUS THE SIGNIFICANCE OF ISOLATING THIS ORGANISM FROM A SINGLE SET OF BLOOD CULTURES WHEN MULTIPLE SETS ARE DRAWN IS UNCERTAIN. PLEASE NOTIFY THE MICROBIOLOGY DEPARTMENT WITHIN ONE WEEK IF SPECIATION AND SENSITIVITIES ARE REQUIRED. Performed at [facility], [place] [address] [place], [place]  [address]    Report Status 01/04/2017 FINAL  Final  Blood Culture ID Panel (Reflexed)     Status: Abnormal   Collection Time: 01/01/17  9:55 AM  Result Value Ref Range Status   Enterococcus species NOT DETECTED NOT DETECTED Final   Listeria monocytogenes NOT DETECTED NOT DETECTED Final   Staphylococcus species NOT DETECTED NOT DETECTED Final   Staphylococcus aureus NOT DETECTED NOT DETECTED Final   Streptococcus species DETECTED (A) NOT DETECTED Final    Comment: Not Enterococcus species, Streptococcus agalactiae, Streptococcus pyogenes, or Streptococcus pneumoniae. CRITICAL RESULT CALLED TO, READ BACK BY AND VERIFIED WITH: Rich Fuchs PHARMD [id number] 01/02/17 A BROWNING    Streptococcus agalactiae NOT DETECTED NOT DETECTED Final   Streptococcus pneumoniae NOT DETECTED NOT DETECTED Final   Streptococcus pyogenes NOT DETECTED NOT DETECTED Final   Acinetobacter baumannii NOT DETECTED NOT DETECTED Final   Enterobacteriaceae species NOT DETECTED NOT DETECTED Final   Enterobacter cloacae complex NOT DETECTED NOT DETECTED Final   Escherichia coli NOT DETECTED NOT DETECTED Final   Klebsiella oxytoca NOT DETECTED NOT DETECTED Final   Klebsiella pneumoniae NOT DETECTED NOT DETECTED Final   Proteus species NOT DETECTED NOT DETECTED Final   Serratia marcescens NOT DETECTED NOT DETECTED Final   Haemophilus influenzae NOT DETECTED NOT DETECTED Final   Neisseria meningitidis NOT DETECTED NOT DETECTED Final   Pseudomonas aeruginosa NOT DETECTED NOT DETECTED Final   Candida albicans NOT DETECTED NOT DETECTED Final   Candida glabrata NOT DETECTED NOT DETECTED Final   Candida krusei NOT DETECTED NOT DETECTED Final   Candida parapsilosis NOT DETECTED NOT DETECTED Final   Candida tropicalis NOT DETECTED NOT DETECTED Final    Comment: Performed at [facility], [place]. [address], [place], [place] [address]  MRSA PCR Screening     Status: None   Collection Time: 01/01/17  5:36 PM  Result Value Ref Range  Status   MRSA by PCR NEGATIVE NEGATIVE Final    Comment:        The GeneXpert MRSA Assay (FDA approved for NASAL specimens only), is one component of a comprehensive MRSA  colonization surveillance program. It is not intended to diagnose MRSA infection nor to guide or monitor treatment for MRSA infections.   Culture, expectorated sputum-assessment     Status: None   Collection Time: 01/04/17  6:16 AM  Result Value Ref Range Status   Specimen Description SPUTUM  Final   Special Requests NONE  Final   Sputum evaluation   Final    Sputum specimen not acceptable for testing.  Please recollect.   Results Called to: Stephens Shire, RN AT [phone] 01/04/17 BY C. JESSUP, MLT.    Report Status 01/04/2017 FINAL  Final         Radiology Studies: Dg Chest 2 View  Result Date: 01/04/2017 CLINICAL DATA:  Follow-up of pneumonia EXAM: CHEST  2 VIEW COMPARISON:  Portable chest x-ray of January 02, 2017 FINDINGS: The lungs are well-expanded. There patchy areas of increased density at the left lung base. There is no alveolar infiltrate or pleural effusion. The heart and pulmonary vascularity are normal. There is calcification in the wall of the aortic arch. There is multilevel degenerative disc disease of the thoracic spine. IMPRESSION: Patchy pneumonia in the left lower lobe. No significant abnormality on the right. No CHF. Thoracic aortic atherosclerosis. Electronically Signed   By: David  Martinique M.D.   On: 01/04/2017 07:25        Scheduled Meds: . aspirin EC  81 mg Oral Daily  . atorvastatin  80 mg Oral Daily  . carvedilol  25 mg Oral BID  . cholecalciferol  1,000 Units Oral Daily  . clopidogrel  75 mg Oral Daily  . gabapentin  300 mg Oral TID  . guaiFENesin  600 mg Oral BID  . heparin injection (subcutaneous)  5,000 Units Subcutaneous Q8H  . hydrALAZINE  75 mg Oral BID  . insulin aspart  0-15 Units Subcutaneous TID WC  . isosorbide mononitrate  60 mg Oral Daily  . oseltamivir  30 mg Oral  Daily  . oxymetazoline  1 spray Each Nare Once  . pneumococcal 23 valent vaccine  0.5 mL Intramuscular Tomorrow-1000   Continuous Infusions: . cefTRIAXone (ROCEPHIN)  IV Stopped (01/03/17 1512)     LOS: 3 days    Time spent: 54min    Domenic Polite, MD [facility] Page via [email or web address], password [email or web address] After 7PM please contact night-coverage  01/04/2017, 12:00 PM

## 2017-01-04 NOTE — Progress Notes (Signed)
Patient has not had a BM since Wednesday 12/26.  Pt normally takes a stool softener at home. Page Dr. Edythe Clarity get order.  Pt would also like to have tylenol PRN for mild back pain.

## 2017-01-04 NOTE — Care Management Note (Signed)
Case Management Note  Patient Details  Name: Rebekah Johnson MRN: [id number] Date of Birth: Jun 20, 1960  Subjective/Objective:   NSTEMI                Action/Plan: Patient goes to the [facility] for follow up care; apt scheduled for Jan 14, 2017 at  9 am; she can get her medication at their pharmacy also.  Expected Discharge Date:   possibly 01/06/2017               Expected Discharge Plan:  Home/Self Care  Discharge planning Services  CM Consult, Follow-up appt scheduled  Status of Service:  In process, will continue to follow  [name] [phone] 01/04/2017, 10:26 AM

## 2017-01-05 LAB — GLUCOSE, CAPILLARY
GLUCOSE-CAPILLARY: 183 mg/dL — AB (ref 65–99)
Glucose-Capillary: 153 mg/dL — ABNORMAL HIGH (ref 65–99)
Glucose-Capillary: 151 mg/dL — ABNORMAL HIGH (ref 65–99)
Glucose-Capillary: 158 mg/dL — ABNORMAL HIGH (ref 65–99)

## 2017-01-05 LAB — BASIC METABOLIC PANEL
ANION GAP: 9 (ref 5–15)
BUN: 27 mg/dL — ABNORMAL HIGH (ref 6–20)
CALCIUM: 8.8 mg/dL — AB (ref 8.9–10.3)
CO2: 27 mmol/L (ref 22–32)
Chloride: 100 mmol/L — ABNORMAL LOW (ref 101–111)
Creatinine, Ser: 2.38 mg/dL — ABNORMAL HIGH (ref 0.44–1.00)
GFR, EST AFRICAN AMERICAN: 25 mL/min — AB (ref >60)
GFR, EST NON AFRICAN AMERICAN: 21 mL/min — AB (ref >60)
Glucose, Bld: 184 mg/dL — ABNORMAL HIGH (ref 65–99)
Potassium: 3.6 mmol/L (ref 3.5–5.1)
Sodium: 136 mmol/L (ref 135–145)

## 2017-01-05 NOTE — Evaluation (Signed)
Physical Therapy Evaluation Patient Details Name: Rebekah Johnson MRN: [id number] DOB: 06/25/1960 Today's Date: 01/05/2017   History of Present Illness  Patient is a 57 y/o female admitted with flu, PNA and NSTEMI.  PMH positive for DM, HTN, L foot toe amputations  Clinical Impression  Patient presents close to functional baseline with some general weakness from bedrest with acute illness.  No current skilled PT needs, but will benefit from tub bench for home for safety and fall prevention. Will sign off.     Follow Up Recommendations No PT follow up    Equipment Recommendations  Other (comment)(tub bench)    Recommendations for Other Services       Precautions / Restrictions Precautions Precautions: None      Mobility  Bed Mobility Overal bed mobility: Modified Independent                Transfers Overall transfer level: Modified independent                  Ambulation/Gait Ambulation/Gait assistance: Supervision;Min guard Ambulation Distance (Feet): 150 Feet Assistive device: None;1 person hand held assist Gait Pattern/deviations: Step-through pattern;Decreased stride length     General Gait Details: ambulating initially with minguard for safety, but then with S, no LOB   Stairs            Wheelchair Mobility    Modified Rankin (Stroke Patients Only)       Balance Overall balance assessment: No apparent balance deficits (not formally assessed)(able to bed over and pick up items from floor with one hand supported, no LOB)                                           Pertinent Vitals/Pain Pain Assessment: No/denies pain    Home Living Family/patient expects to be discharged to:: Private residence Living Arrangements: Alone Available Help at Discharge: Family Type of Home: House Home Access: Stairs to enter Entrance Stairs-Rails: None Technical brewer of Steps: 3 Home Layout: One level Home Equipment: Environmental consultant - 2  wheels      Prior Function Level of Independence: Independent               Hand Dominance        Extremity/Trunk Assessment   Upper Extremity Assessment Upper Extremity Assessment: Defer to OT evaluation    Lower Extremity Assessment Lower Extremity Assessment: Overall WFL for tasks assessed    Cervical / Trunk Assessment Cervical / Trunk Assessment: Lordotic  Communication   Communication: No difficulties  Cognition Arousal/Alertness: Awake/alert Behavior During Therapy: WFL for tasks assessed/performed Overall Cognitive Status: Within Functional Limits for tasks assessed                                        General Comments      Exercises     Assessment/Plan    PT Assessment Patent does not need any further PT services  PT Problem List         PT Treatment Interventions      PT Goals (Current goals can be found in the Care Plan section)  Acute Rehab PT Goals PT Goal Formulation: All assessment and education complete, DC therapy    Frequency     Barriers to discharge  Co-evaluation               AM-PAC PT "6 Clicks" Daily Activity  Outcome Measure Difficulty turning over in bed (including adjusting bedclothes, sheets and blankets)?: None Difficulty moving from lying on back to sitting on the side of the bed? : None Difficulty sitting down on and standing up from a chair with arms (e.g., wheelchair, bedside commode, etc,.)?: None Help needed moving to and from a bed to chair (including a wheelchair)?: None Help needed walking in hospital room?: A Little Help needed climbing 3-5 steps with a railing? : A Little 6 Click Score: 22    End of Session   Activity Tolerance: Patient tolerated treatment well Patient left: in chair;with call bell/phone within reach   PT Visit Diagnosis: Other abnormalities of gait and mobility (R26.89)    Time: 1510-1537 PT Time Calculation (min) (ACUTE ONLY): 27 min   Charges:    PT Evaluation $PT Eval Low Complexity: 1 Low PT Treatments $Gait Training: 8-22 mins   PT G CodesMagda Johnson, [place] [phone] 01/05/2017   Rebekah Johnson 01/05/2017, 4:13 PM

## 2017-01-05 NOTE — Progress Notes (Signed)
PROGRESS NOTE    Rebekah Johnson  SEG:[id number] DOB: 26-Sep-1960 DOA: 01/01/2017 PCP: Arnoldo Morale, MD  Brief Narrative:Rebekah Johnson is a 57 y.o. female  H/o noninsulin dependent dm2, HTN,  h/o pvd status postleft SFA and popliteal artery stent and left SFA PTA with drug-coated balloonin 08/2015, CAD with severe 2 V CAD from cardiac cath 09/2016 on medical management, h/o diastolic chf, ckd III. She was just evaluated by pmd on 12/27, she presented to [facility] ED due to c/o shortness of breath and central to left chest pain since last night. Reports nausea and 2 episodes of vomiting. She is found to have fever 101.7, + fluA, + mild right mid lobe infiltrates , troponin elevated, she has hypoxia and tachypnea, blood culture obtained, she is given vanc/zosyn in the Ed, she is started on bipap and transfered to [place] stepdown.  Assessment & Plan:     ACute hypoxic resp failure -Improved, stable, required BiPAP briefly on admission -Due to influenza/pneumonia -Improving, completed course of Tamiflu wean off O2  -Follow-up blood cultures with strep viridans in one out of 2, most likely contaminant, stopped IV ceftriaxone    NSTEMI (non-ST elevated myocardial infarction) (HCC) -h/o severe 2vessel CAD on medical management -trop significantly elevated in setting of hypoxia/flu -stopped heparin -Cards consult appreciated, not candidate for invasive Rx -Continue aspirin/Plavix, Coreg, statin and Imdur  Chronic diastolic CHF -Recently admitted in September for diastolic CHF and NSTEMI -Diuresed during that hospitalization reportedly takes 40 mg by mouth twice a day of Lasix at home although it's not on her medication list -Restart Lasix at discharge  hypokalemia Likely from vomiting and taking lasix Replaced  AKi on CKD3 -Cr 2.1 on admission, baseline appear to be at 1.6-1.7 -creatinine has trended up to 2.6 likely low-grade sepsis from flu , monitor, holding diuretics  -suspect some  degree of cardiorenal syndrome from NSTEMI too contributing -Creatinine has plateaued and now finally starting to improve, restart diuretics at discharge  Noninsulin dependent dm2 -a1c 7.8 -continue SSI  HTN: continue coreg/imdur/hydralzine -cut down hydralazine  HLD: continue statin  Obesity: Body mass index is 34.81 kg/m.  DVT prophylaxis:  heparin  Consultants: cardiology Code Status: full  Family Communication:  Patient   Disposition Plan:  home tomorrow if stable   Consultants:   Cardiology   Procedures:   Antimicrobials:  Antibiotics Given (last 72 hours)    Date/Time Action Medication Dose Rate   01/02/17 1317 New Bag/Given   cefTRIAXone (ROCEPHIN) 2 g in dextrose 5 % 50 mL IVPB 2 g 100 mL/hr   01/03/17 0840 Given   oseltamivir (TAMIFLU) capsule 30 mg 30 mg    01/03/17 1338 New Bag/Given   cefTRIAXone (ROCEPHIN) 2 g in dextrose 5 % 50 mL IVPB 2 g 100 mL/hr   01/04/17 7371 Given   oseltamivir (TAMIFLU) capsule 30 mg 30 mg    01/05/17 0901 Given   oseltamivir (TAMIFLU) capsule 30 mg 30 mg       Subjective: -Breathing continues to improve, still has an occasional cough   Objective: Vitals:   01/04/17 1702 01/04/17 1718 01/04/17 2157 01/05/17 0619  BP: 133/68  (!) 163/56 (!) 169/67  Pulse: 67  65 65  Resp:   16   Temp:   98.7 F (37.1 C) 98.8 F (37.1 C)  TempSrc:   Oral Oral  SpO2: 96% 95% 96% 94%  Weight:    88.8 kg (195 lb 12.8 oz)  Height:  Intake/Output Summary (Last 24 hours) at 01/05/2017 1017 Last data filed at 01/05/2017 1012 Gross per 24 hour  Intake 1520 ml  Output 3570 ml  Net -2050 ml   Filed Weights   01/03/17 2147 01/04/17 0449 01/05/17 0619  Weight: 90 kg (198 lb 6.4 oz) 89.9 kg (198 lb 3.2 oz) 88.8 kg (195 lb 12.8 oz)    Examination:  Gen: Awake, Alert, Oriented X 3,  HEENT: PERRLA, Neck supple, no JVD Lungs: improved air movement, scattered rhonchi at the bases CVS: RRR,No Gallops,Rubs or new  Murmurs Abd: soft, Non tender, non distended, BS present Extremities: No Cyanosis, Clubbing or edema Skin: no new rashes   Data Reviewed:   CBC: Recent Labs  Lab 01/01/17 0917 01/02/17 0857 01/03/17 0254 01/04/17 0551  WBC 11.3* 6.8 4.9 5.6  NEUTROABS 9.2*  --   --   --   HGB 12.9 11.4* 10.3* 10.6*  HCT 39.1 35.7* 32.2* 32.8*  MCV 78.4 80.4 80.9 81.0  PLT 234 178 154 161*   Basic Metabolic Panel: Recent Labs  Lab 01/01/17 0917 01/01/17 2311 01/02/17 0857 01/03/17 0746 01/04/17 0551 01/05/17 0409  NA 139  --  133* 133* 132* 136  K 3.1*  --  3.5 3.5 3.6 3.6  CL 102  --  101 98* 98* 100*  CO2 25  --  20* 24 24 27   GLUCOSE 168*  --  135* 137* 131* 184*  BUN 25*  --  28* 30* 29* 27*  CREATININE 2.10*  --  2.62* 2.64* 2.54* 2.38*  CALCIUM 9.3  --  8.5* 8.4* 8.4* 8.8*  MG  --  1.7  --   --   --   --    GFR: Estimated Creatinine Clearance: 27.8 mL/min (A) (by C-G formula based on SCr of 2.38 mg/dL (H)). Liver Function Tests: Recent Labs  Lab 01/01/17 0917  AST 33  ALT 22  ALKPHOS 105  BILITOT 0.6  PROT 7.8  ALBUMIN 3.7   No results for input(s): LIPASE, AMYLASE in the last 168 hours. No results for input(s): AMMONIA in the last 168 hours. Coagulation Profile: No results for input(s): INR, PROTIME in the last 168 hours. Cardiac Enzymes: Recent Labs  Lab 01/01/17 0917 01/01/17 1311 01/01/17 1738 01/01/17 2311 01/02/17 0857  TROPONINI 2.04* 4.90* 4.26* 4.34* 3.58*   BNP (last 3 results) No results for input(s): PROBNP in the last 8760 hours. HbA1C: No results for input(s): HGBA1C in the last 72 hours. CBG: Recent Labs  Lab 01/04/17 0729 01/04/17 1159 01/04/17 1631 01/04/17 2154 01/05/17 0737  GLUCAP 130* 209* 129* 164* 153*   Lipid Profile: No results for input(s): CHOL, HDL, LDLCALC, TRIG, CHOLHDL, LDLDIRECT in the last 72 hours. Thyroid Function Tests: No results for input(s): TSH, T4TOTAL, FREET4, T3FREE, THYROIDAB in the last 72  hours. Anemia Panel: No results for input(s): VITAMINB12, FOLATE, FERRITIN, TIBC, IRON, RETICCTPCT in the last 72 hours. Urine analysis:    Component Value Date/Time   COLORURINE YELLOW 01/02/2017 0037   APPEARANCEUR HAZY (A) 01/02/2017 0037   LABSPEC 1.017 01/02/2017 0037   PHURINE 5.0 01/02/2017 0037   GLUCOSEU 50 (A) 01/02/2017 0037   HGBUR SMALL (A) 01/02/2017 0037   BILIRUBINUR NEGATIVE 01/02/2017 0037   KETONESUR NEGATIVE 01/02/2017 0037   PROTEINUR >=300 (A) 01/02/2017 0037   NITRITE NEGATIVE 01/02/2017 0037   LEUKOCYTESUR NEGATIVE 01/02/2017 0037   Sepsis Labs: @LABRCNTIP (procalcitonin:4,lacticidven:4)  ) Recent Results (from the past 240 hour(s))  Blood culture (routine x 2)  Status: None (Preliminary result)   Collection Time: 01/01/17  9:45 AM  Result Value Ref Range Status   Specimen Description BLOOD LEFT ANTECUBITAL  Final   Special Requests   Final    BOTTLES DRAWN AEROBIC AND ANAEROBIC Blood Culture adequate volume   Culture   Final    NO GROWTH 3 DAYS Performed at [facility], [address], [place], [place] [address]    Report Status PENDING  Incomplete  Blood culture (routine x 2)     Status: Abnormal   Collection Time: 01/01/17  9:55 AM  Result Value Ref Range Status   Specimen Description BLOOD RIGHT ANTECUBITAL  Final   Special Requests   Final    BOTTLES DRAWN AEROBIC AND ANAEROBIC Blood Culture adequate volume   Culture  Setup Time   Final    GRAM POSITIVE COCCI IN CHAINS ANAEROBIC BOTTLE ONLY CRITICAL RESULT CALLED TO, READ BACK BY AND VERIFIED WITH: Rich Fuchs SHFWYO [id number] 01/02/17 A BROWNING    Culture (A)  Final    VIRIDANS STREPTOCOCCUS THE SIGNIFICANCE OF ISOLATING THIS ORGANISM FROM A SINGLE SET OF BLOOD CULTURES WHEN MULTIPLE SETS ARE DRAWN IS UNCERTAIN. PLEASE NOTIFY THE MICROBIOLOGY DEPARTMENT WITHIN ONE WEEK IF SPECIATION AND SENSITIVITIES ARE REQUIRED. Performed at [facility], [place] [address] [place], [place]  [address]    Report Status 01/04/2017 FINAL  Final  Blood Culture ID Panel (Reflexed)     Status: Abnormal   Collection Time: 01/01/17  9:55 AM  Result Value Ref Range Status   Enterococcus species NOT DETECTED NOT DETECTED Final   Listeria monocytogenes NOT DETECTED NOT DETECTED Final   Staphylococcus species NOT DETECTED NOT DETECTED Final   Staphylococcus aureus NOT DETECTED NOT DETECTED Final   Streptococcus species DETECTED (A) NOT DETECTED Final    Comment: Not Enterococcus species, Streptococcus agalactiae, Streptococcus pyogenes, or Streptococcus pneumoniae. CRITICAL RESULT CALLED TO, READ BACK BY AND VERIFIED WITH: Rich Fuchs PHARMD [id number] 01/02/17 A BROWNING    Streptococcus agalactiae NOT DETECTED NOT DETECTED Final   Streptococcus pneumoniae NOT DETECTED NOT DETECTED Final   Streptococcus pyogenes NOT DETECTED NOT DETECTED Final   Acinetobacter baumannii NOT DETECTED NOT DETECTED Final   Enterobacteriaceae species NOT DETECTED NOT DETECTED Final   Enterobacter cloacae complex NOT DETECTED NOT DETECTED Final   Escherichia coli NOT DETECTED NOT DETECTED Final   Klebsiella oxytoca NOT DETECTED NOT DETECTED Final   Klebsiella pneumoniae NOT DETECTED NOT DETECTED Final   Proteus species NOT DETECTED NOT DETECTED Final   Serratia marcescens NOT DETECTED NOT DETECTED Final   Haemophilus influenzae NOT DETECTED NOT DETECTED Final   Neisseria meningitidis NOT DETECTED NOT DETECTED Final   Pseudomonas aeruginosa NOT DETECTED NOT DETECTED Final   Candida albicans NOT DETECTED NOT DETECTED Final   Candida glabrata NOT DETECTED NOT DETECTED Final   Candida krusei NOT DETECTED NOT DETECTED Final   Candida parapsilosis NOT DETECTED NOT DETECTED Final   Candida tropicalis NOT DETECTED NOT DETECTED Final    Comment: Performed at [facility], [place]. [address] [place], [place] [address]  MRSA PCR Screening     Status: None   Collection Time: 01/01/17  5:36 PM  Result Value Ref Range  Status   MRSA by PCR NEGATIVE NEGATIVE Final    Comment:        The GeneXpert MRSA Assay (FDA approved for NASAL specimens only), is one component of a comprehensive MRSA colonization surveillance program. It is not intended  to diagnose MRSA infection nor to guide or monitor treatment for MRSA infections.   Culture, expectorated sputum-assessment     Status: None   Collection Time: 01/04/17  6:16 AM  Result Value Ref Range Status   Specimen Description SPUTUM  Final   Special Requests NONE  Final   Sputum evaluation   Final    Sputum specimen not acceptable for testing.  Please recollect.   Results Called to: Stephens Shire, RN AT [phone] 01/04/17 BY C. JESSUP, MLT.    Report Status 01/04/2017 FINAL  Final         Radiology Studies: Dg Chest 2 View  Result Date: 01/04/2017 CLINICAL DATA:  Follow-up of pneumonia EXAM: CHEST  2 VIEW COMPARISON:  Portable chest x-ray of January 02, 2017 FINDINGS: The lungs are well-expanded. There patchy areas of increased density at the left lung base. There is no alveolar infiltrate or pleural effusion. The heart and pulmonary vascularity are normal. There is calcification in the wall of the aortic arch. There is multilevel degenerative disc disease of the thoracic spine. IMPRESSION: Patchy pneumonia in the left lower lobe. No significant abnormality on the right. No CHF. Thoracic aortic atherosclerosis. Electronically Signed   By: David  Martinique M.D.   On: 01/04/2017 07:25        Scheduled Meds: . aspirin EC  81 mg Oral Daily  . atorvastatin  80 mg Oral Daily  . carvedilol  25 mg Oral BID  . cholecalciferol  1,000 Units Oral Daily  . clopidogrel  75 mg Oral Daily  . gabapentin  300 mg Oral TID  . guaiFENesin  600 mg Oral BID  . heparin injection (subcutaneous)  5,000 Units Subcutaneous Q8H  . hydrALAZINE  75 mg Oral BID  . insulin aspart  0-15 Units Subcutaneous TID WC  . isosorbide mononitrate  60 mg Oral Daily  . oseltamivir  30 mg Oral  Daily  . oxymetazoline  1 spray Each Nare Once  . pneumococcal 23 valent vaccine  0.5 mL Intramuscular Tomorrow-1000  . senna-docusate  1 tablet Oral BID   Continuous Infusions:    LOS: 4 days    Time spent: 11min    Domenic Polite, MD [facility] Page via [email or web address], password [email or web address] After 7PM please contact night-coverage  01/05/2017, 10:17 AM

## 2017-01-06 LAB — GLUCOSE, CAPILLARY
GLUCOSE-CAPILLARY: 175 mg/dL — AB (ref 65–99)
Glucose-Capillary: 125 mg/dL — ABNORMAL HIGH (ref 65–99)

## 2017-01-06 LAB — BASIC METABOLIC PANEL
Anion gap: 8 (ref 5–15)
BUN: 19 mg/dL (ref 6–20)
CHLORIDE: 104 mmol/L (ref 101–111)
CO2: 24 mmol/L (ref 22–32)
Calcium: 8.7 mg/dL — ABNORMAL LOW (ref 8.9–10.3)
Creatinine, Ser: 2.02 mg/dL — ABNORMAL HIGH (ref 0.44–1.00)
GFR calc Af Amer: 30 mL/min — ABNORMAL LOW (ref >60)
GFR calc non Af Amer: 26 mL/min — ABNORMAL LOW (ref >60)
GLUCOSE: 132 mg/dL — AB (ref 65–99)
POTASSIUM: 3.6 mmol/L (ref 3.5–5.1)
Sodium: 136 mmol/L (ref 135–145)

## 2017-01-06 LAB — CULTURE, BLOOD (ROUTINE X 2)
Culture: NO GROWTH
Special Requests: ADEQUATE

## 2017-01-06 MED ORDER — OSELTAMIVIR PHOSPHATE 30 MG PO CAPS: 30.0000 mg | ORAL_CAPSULE | Freq: Every day | ORAL | Status: DC

## 2017-01-06 MED ORDER — OSELTAMIVIR PHOSPHATE 30 MG PO CAPS: 30.0000 mg | ORAL_CAPSULE | Freq: Two times a day (BID) | ORAL | Status: DC

## 2017-01-06 MED ORDER — FUROSEMIDE 40 MG PO TABS: 40.0000 mg | ORAL_TABLET | Freq: Every day | ORAL | 0 refills | Status: DC

## 2017-01-06 NOTE — Care Management Note (Addendum)
Case Management Note Per Previous note: Rebekah Johnson [phone] 01/04/2017, 10:26 AM  Patient Details  Name: Rebekah Johnson MRN: [id number] Date of Birth: 13-May-1960  Subjective/Objective:   NSTEMI                Action/Plan: Patient goes to the [facility] for follow up care; apt scheduled for Jan 14, 2017 at  9 am; she can get her medication at their pharmacy also.  Expected Discharge Date:  01/02/19possibly 01/06/2017               Expected Discharge Plan:  Home/Self Care  Discharge planning Services  CM Consult, Follow-up appt scheduled  Status of Service:  In process, will continue to follow __________________ 01/06/2017 2:33pm Rebekah Johnson, BSN, RN Case Manager-orientation [phone]:  Post Acute Care Choice:  Durable Medical Equipment Choice offered to:  Patient  DME Arranged:  Tub bench DME Agency:  [facility].  Status of Service:Completed, signed off.  Patient for discharge today. In to speak with patient.  Referral for tub bench called to [facility] with [facility].  PCP is Dr. Jarold Song and uses [facility] pharmacy.  Patient denies inability to afford medications or food.  Patient states she usually drives herself for shopping and medical appointments. Patient states her neighbor is available if needed.

## 2017-01-06 NOTE — Discharge Summary (Signed)
Physician Discharge Summary  Rebekah Johnson LTR:[id number] DOB: 1960-08-04 DOA: 01/01/2017  PCP: Arnoldo Morale, MD  Admit date: 01/01/2017 Discharge date: 01/06/2017  Time spent: 45 minutes  Recommendations for Outpatient Follow-up:  PCP at [facility] on 1/10 at [facility] Dr. [name] in 2-3 weeks  Discharge Diagnoses:    Acute hypoxic resp failure   Influenza A   NSTEMI (non-ST elevated myocardial infarction) [facility]   Chest pain   Chronic diastolic CHF   Acute kidney injury on CK D3   Hypertension   Type 2 diabetes   Dyslipidemia   Obesity     Discharge Condition: Improved  Diet recommendation: Diabetic/heart healthy  Filed Weights   01/04/17 0449 01/05/17 0619 01/06/17 0553  Weight: 89.9 kg (198 lb 3.2 oz) 88.8 kg (195 lb 12.8 oz) 88.5 kg (195 lb 1.6 oz)    History of present illness:  Rebekah Ajigbolamuis a 57 y.o.female H/o noninsulin dependent dm2, HTN, h/o pvd status postleft SFA and popliteal artery stent and left SFA PTA with drug-coated balloonin 08/2015, CAD withsevere 2 V CAD from cardiac cath 9/2018on medical management,h/o diastolic chf, ckd III.She was just evaluated by pmd on 12/27, she presented to [facility] ED due to c/oshortness of breath and central to left chest pain since last night, with nausea and 2 episodes of vomiting.She was found to have fever 101.7, + fluA, + mildright mid lobe infiltrates, troponin elevated, hypoxia and tachypnea  Hospital Course:   ACute hypoxic resp failure -Improved, stable, required BiPAP briefly on admission -Due to influenza/pneumonia -Improving, completed course of Tamiflu weaned off O2  -Follow-up blood cultures with strep viridans in one out of 2, most likely contaminant, stopped IV ceftriaxone    NSTEMI (non-ST elevated myocardial infarction) (HCC) -h/o severe 2vessel CAD on medical management -troponin significantly elevated at 4.9 in setting of hypoxia/flu -Cardiology consulted, treated with IV heparin in  addition to aspirin/Plavix and Coreg -Due to acute renal failure on chronic kidney disease she was not felt to be a candidate for invasive intervention/left heart catheterization and hence cardiology decided to treat her medically -Now stable -Continue aspirin/Plavix, Coreg, statin and Imdur -Follow-up with Dr. [name]  Chronic diastolic CHF -Recently admitted in September for diastolic CHF and NSTEMI -Diuresed during that hospitalization reportedly takes 40 mg by mouth twice a day of Lasix at home although it's not on her medication list -Restart Lasix at 40 mg daily, can increase dose at follow-up if creatinine is stable  hypokalemia Likely from vomiting and taking lasix Replaced  AKi on CKD3 -Cr 2.1 on admission, baseline appear to be at 1.6-1.7 -creatinine has trended up to 2.6 likely low-grade sepsis from flu and cardiorenal syndrome from NSTEMI -Creatinine has plateaued and now finally starting to improve, trended down to 2.0 today, restarted Lasix 40 mg daily at discharge  Noninsulin dependent dm2 -a1c 7.8 -Restarted Januvia and glipizide  HTN: continue coreg/imdur/hydralzine -Stable, Lasix dose decreased at discharge due to AKI  WYS:HUOHFGBM statin  Obesity: Body mass index is 34.81 kg/m.     Consultations:  Cardiology  Discharge Exam: Vitals:   01/06/17 0553 01/06/17 1139  BP: (!) 171/76 (!) 174/70  Pulse: (!) 55 (!) 54  Resp: 18   Temp: 98.1 F (36.7 C)   SpO2: 99% 100%    General: AAO 3 Cardiovascular: S1-S2 regular rate rhythm Respiratory: Improved air movement, bases clear now  Discharge Instructions    Allergies as of 01/06/2017   No Known Allergies     Medication List  TAKE these medications   ACCU-CHEK AVIVA device Use as instructed three times daily.   accu-chek softclix lancets Use as instructed three times daily before meals.   amLODipine 10 MG tablet Commonly known as:  NORVASC Take 1 tablet (10 mg total) by mouth  daily. Reported on 07/13/2015   aspirin 81 MG EC tablet Take 1 tablet (81 mg total) by mouth daily. Reported on 07/13/2015   atorvastatin 80 MG tablet Commonly known as:  LIPITOR Take 1 tablet (80 mg total) by mouth daily.   blood glucose meter kit and supplies Kit Dispense based on patient and insurance preference. Use up to four times daily as directed. (FOR ICD-9 250.00, 250.01).   carvedilol 25 MG tablet Commonly known as:  COREG Take 1 tablet (25 mg total) by mouth 2 (two) times daily.   clopidogrel 75 MG tablet Commonly known as:  PLAVIX Take 1 tablet (75 mg total) by mouth daily.   docusate sodium 100 MG capsule Commonly known as:  COLACE TAKE ONE CAPSULE BY MOUTH TWICE A DAY   Fish Oil 1000 MG Caps Take 1,000 mg by mouth daily. Reported on 07/13/2015   furosemide 40 MG tablet Commonly known as:  LASIX Take 1 tablet (40 mg total) by mouth daily.   gabapentin 300 MG capsule Commonly known as:  NEURONTIN Take 1 capsule (300 mg total) by mouth 3 (three) times daily.   glipiZIDE 10 MG tablet Commonly known as:  GLUCOTROL Take 1 tablet (10 mg total) by mouth 2 (two) times daily before a meal.   glucose blood test strip Commonly known as:  ACCU-CHEK AVIVA Use as instructed three times daily before meals.   hydrALAZINE 100 MG tablet Commonly known as:  APRESOLINE Take 1 tablet (100 mg total) by mouth 2 (two) times daily.   isosorbide mononitrate 60 MG 24 hr tablet Commonly known as:  IMDUR Take 1 tablet (60 mg total) by mouth daily.   nitroGLYCERIN 0.4 MG SL tablet Commonly known as:  NITROSTAT Place 1 tablet (0.4 mg total) under the tongue every 5 (five) minutes as needed for chest pain.   sitaGLIPtin 50 MG tablet Commonly known as:  JANUVIA Take 1 tablet (50 mg total) by mouth daily.   Vitamin D-3 1000 units Caps Take 1,000 Units by mouth daily.      No Known Allergies Follow-up Information    [facility] Follow up on  01/14/2017.   Why:  at 9 am; please try to keep your apt or call to reschedule Contact information: [address] [place]  [phone]           The results of significant diagnostics from this hospitalization (including imaging, microbiology, ancillary and laboratory) are listed below for reference.    Significant Diagnostic Studies: Dg Chest 2 View  Result Date: 01/04/2017 CLINICAL DATA:  Follow-up of pneumonia EXAM: CHEST  2 VIEW COMPARISON:  Portable chest x-ray of January 02, 2017 FINDINGS: The lungs are well-expanded. There patchy areas of increased density at the left lung base. There is no alveolar infiltrate or pleural effusion. The heart and pulmonary vascularity are normal. There is calcification in the wall of the aortic arch. There is multilevel degenerative disc disease of the thoracic spine. IMPRESSION: Patchy pneumonia in the left lower lobe. No significant abnormality on the right. No CHF. Thoracic aortic atherosclerosis. Electronically Signed   By: David  Martinique M.D.   On: 01/04/2017 07:25   Dg Chest Port 1  View  Result Date: 01/02/2017 CLINICAL DATA:  Shortness of breath and chest pain for 2 days EXAM: PORTABLE CHEST 1 VIEW COMPARISON:  January 01, 2017 FINDINGS: The heart size and mediastinal contours are stable. There is mild pulmonary edema. There is no focal pneumonia or pleural effusion. The previously noted consolidation of the lateral right mid lung is not seen today. The visualized skeletal structures are stable. IMPRESSION: Mild pulmonary edema. The previously noted opacity of the right lateral mid lung has resolved. Electronically Signed   By: Abelardo Diesel M.D.   On: 01/02/2017 10:12   Dg Chest Portable 1 View  Result Date: 01/01/2017 CLINICAL DATA:  Patient reports shortness of breath and central to left chest pain since last night. Reports nausea and 2 episodes of vomiting. Tobacco abuse, DM, renal disease, heart cath,  hysterectomy EXAM: PORTABLE CHEST 1 VIEW COMPARISON:  09/15/2016 FINDINGS: Heart size is normal. Indistinct opacity in the right mid lung zone measures approximately 4.0 cm. There is mild prominence of interstitial markings diffusely. No evidence for pulmonary edema. No pleural effusions. IMPRESSION: 1. 4 cm right mid lung zone opacity is suspicious for mass but could also represent an infectious infiltrate. Close follow-up is needed to exclude malignancy. 2. Followup PA and lateral chest X-ray is recommended in 3-4 weeks following trial of antibiotic therapy to ensure resolution and exclude underlying malignancy. Electronically Signed   By: Nolon Nations M.D.   On: 01/01/2017 09:40    Microbiology: Recent Results (from the past 240 hour(s))  Blood culture (routine x 2)     Status: None (Preliminary result)   Collection Time: 01/01/17  9:45 AM  Result Value Ref Range Status   Specimen Description BLOOD LEFT ANTECUBITAL  Final   Special Requests   Final    BOTTLES DRAWN AEROBIC AND ANAEROBIC Blood Culture adequate volume   Culture   Final    NO GROWTH 4 DAYS Performed at [facility], [address] [place], [place] [address]    Report Status PENDING  Incomplete  Blood culture (routine x 2)     Status: Abnormal   Collection Time: 01/01/17  9:55 AM  Result Value Ref Range Status   Specimen Description BLOOD RIGHT ANTECUBITAL  Final   Special Requests   Final    BOTTLES DRAWN AEROBIC AND ANAEROBIC Blood Culture adequate volume   Culture  Setup Time   Final    GRAM POSITIVE COCCI IN CHAINS ANAEROBIC BOTTLE ONLY CRITICAL RESULT CALLED TO, READ BACK BY AND VERIFIED WITH: Rich Fuchs HERDEY 8144 01/02/17 A BROWNING    Culture (A)  Final    VIRIDANS STREPTOCOCCUS THE SIGNIFICANCE OF ISOLATING THIS ORGANISM FROM A SINGLE SET OF BLOOD CULTURES WHEN MULTIPLE SETS ARE DRAWN IS UNCERTAIN. PLEASE NOTIFY THE MICROBIOLOGY DEPARTMENT WITHIN ONE WEEK IF SPECIATION AND SENSITIVITIES ARE  REQUIRED. Performed at [facility], [place] [address] [place], [place] [address]    Report Status 01/04/2017 FINAL  Final  Blood Culture ID Panel (Reflexed)     Status: Abnormal   Collection Time: 01/01/17  9:55 AM  Result Value Ref Range Status   Enterococcus species NOT DETECTED NOT DETECTED Final   Listeria monocytogenes NOT DETECTED NOT DETECTED Final   Staphylococcus species NOT DETECTED NOT DETECTED Final   Staphylococcus aureus NOT DETECTED NOT DETECTED Final   Streptococcus species DETECTED (A) NOT DETECTED Final    Comment: Not Enterococcus species, Streptococcus agalactiae, Streptococcus pyogenes, or Streptococcus pneumoniae. CRITICAL RESULT CALLED TO, READ  BACK BY AND VERIFIED WITH: Rich Fuchs PHARMD [id number] 01/02/17 A BROWNING    Streptococcus agalactiae NOT DETECTED NOT DETECTED Final   Streptococcus pneumoniae NOT DETECTED NOT DETECTED Final   Streptococcus pyogenes NOT DETECTED NOT DETECTED Final   Acinetobacter baumannii NOT DETECTED NOT DETECTED Final   Enterobacteriaceae species NOT DETECTED NOT DETECTED Final   Enterobacter cloacae complex NOT DETECTED NOT DETECTED Final   Escherichia coli NOT DETECTED NOT DETECTED Final   Klebsiella oxytoca NOT DETECTED NOT DETECTED Final   Klebsiella pneumoniae NOT DETECTED NOT DETECTED Final   Proteus species NOT DETECTED NOT DETECTED Final   Serratia marcescens NOT DETECTED NOT DETECTED Final   Haemophilus influenzae NOT DETECTED NOT DETECTED Final   Neisseria meningitidis NOT DETECTED NOT DETECTED Final   Pseudomonas aeruginosa NOT DETECTED NOT DETECTED Final   Candida albicans NOT DETECTED NOT DETECTED Final   Candida glabrata NOT DETECTED NOT DETECTED Final   Candida krusei NOT DETECTED NOT DETECTED Final   Candida parapsilosis NOT DETECTED NOT DETECTED Final   Candida tropicalis NOT DETECTED NOT DETECTED Final    Comment: Performed at [facility], [place] [address] [place], [place] [address]  MRSA PCR Screening      Status: None   Collection Time: 01/01/17  5:36 PM  Result Value Ref Range Status   MRSA by PCR NEGATIVE NEGATIVE Final    Comment:        The GeneXpert MRSA Assay (FDA approved for NASAL specimens only), is one component of a comprehensive MRSA colonization surveillance program. It is not intended to diagnose MRSA infection nor to guide or monitor treatment for MRSA infections.   Culture, expectorated sputum-assessment     Status: None   Collection Time: 01/04/17  6:16 AM  Result Value Ref Range Status   Specimen Description SPUTUM  Final   Special Requests NONE  Final   Sputum evaluation   Final    Sputum specimen not acceptable for testing.  Please recollect.   Results Called to: Stephens Shire, RN AT [phone] 01/04/17 BY C. JESSUP, MLT.    Report Status 01/04/2017 FINAL  Final     Labs: Basic Metabolic Panel: Recent Labs  Lab 01/01/17 2311 01/02/17 0857 01/03/17 0746 01/04/17 0551 01/05/17 0409 01/06/17 0646  NA  --  133* 133* 132* 136 136  K  --  3.5 3.5 3.6 3.6 3.6  CL  --  101 98* 98* 100* 104  CO2  --  20* '24 24 27 24  ' GLUCOSE  --  135* 137* 131* 184* 132*  BUN  --  28* 30* 29* 27* 19  CREATININE  --  2.62* 2.64* 2.54* 2.38* 2.02*  CALCIUM  --  8.5* 8.4* 8.4* 8.8* 8.7*  MG 1.7  --   --   --   --   --    Liver Function Tests: Recent Labs  Lab 01/01/17 0917  AST 33  ALT 22  ALKPHOS 105  BILITOT 0.6  PROT 7.8  ALBUMIN 3.7   No results for input(s): LIPASE, AMYLASE in the last 168 hours. No results for input(s): AMMONIA in the last 168 hours. CBC: Recent Labs  Lab 01/01/17 0917 01/02/17 0857 01/03/17 0254 01/04/17 0551  WBC 11.3* 6.8 4.9 5.6  NEUTROABS 9.2*  --   --   --   HGB 12.9 11.4* 10.3* 10.6*  HCT 39.1 35.7* 32.2* 32.8*  MCV 78.4 80.4 80.9 81.0  PLT 234 178 154 148*   Cardiac Enzymes: Recent Labs  Lab 01/01/17 0917 01/01/17 1311 01/01/17 1738 01/01/17 2311 01/02/17 0857  TROPONINI 2.04* 4.90* 4.26* 4.34* 3.58*   BNP: BNP (last 3  results) Recent Labs    09/14/16 0809 01/01/17 0917  BNP 549.0* 962.9*    ProBNP (last 3 results) No results for input(s): PROBNP in the last 8760 hours.  CBG: Recent Labs  Lab 01/05/17 1125 01/05/17 1630 01/05/17 2108 01/06/17 0730 01/06/17 1138  GLUCAP 183* 151* 158* 125* 175*       Signed:  Domenic Polite MD.  [facility] 01/06/2017, 2:10 PM

## 2017-01-11 ENCOUNTER — Encounter: Payer: Self-pay | Admitting: Gastroenterology

## 2017-01-13 NOTE — Progress Notes (Signed)
Patient ID: Rebekah Johnson, female   DOB: July 17, 1960, 57 y.o.   MRN: [id number]     Rebekah Johnson, is a 57 y.o. female  QMV:[id number]  MWU:[id number]  DOB - [birth year]  Subjective:  Chief Complaint and HPI: Mariaha Johnson is a 57 y.o. female here today for a follow up visit After hospitalization 01/01/2017-01/06/2017.  Has appt with Dr [name] tomorrow.  She is feeling much better.  Still coughing some.  No f/c.  She quit smoking yesterday and says she had been cutting back over the last few months prior to this.  She denies CP/SOB.  No dizziness.    From hospital discharge Hospital Course:   ACute hypoxic resp failure -Improved, stable,required BiPAP briefly on admission -Due to influenza/pneumonia -Improving,completed course of Tamiflu weaned off O2 -Follow-up blood cultures with strep viridans in one out of 2, most likely contaminant,stopped IV ceftriaxone  NSTEMI (non-ST elevated myocardial infarction) ([place]) -h/o severe 2vessel CAD on medical management -troponin significantly elevated at 4.9 in setting of hypoxia/flu -Cardiology consulted, treated with IV heparin in addition to aspirin/Plavix and Coreg -Due to acute renal failure on chronic kidney disease she was not felt to be a candidate for invasive intervention/left heart catheterization and hence cardiology decided to treat her medically -Now stable -Continue aspirin/Plavix, Coreg, statin and Imdur -Follow-up with Dr. [name]  Chronic diastolic CHF -Recently admitted in September for diastolic CHF and NSTEMI -Diuresed during that hospitalization reportedly takes 40 mg by mouth twice a day of Lasix at home although it's not on her medication list -Restart Lasixat 40 mg daily, can increase dose at follow-up if creatinine is stable  hypokalemia Likely from vomiting and taking lasix Replaced  AKi on CKD3 -Cr 2.1 on admission, baseline appear to be at 1.6-1.7 -creatinine has trended up to 2.6 likely  low-grade sepsis from flu and cardiorenal syndrome from NSTEMI -Creatinine has plateaued and now finally starting to improve, trended down to 2.0 today, restarted Lasix 40 mg daily at discharge  Noninsulin dependent dm2 -a1c 7.8 -Restarted Januvia and glipizide  HTN: continue coreg/imdur/hydralzine -Stable, Lasix dose decreased at discharge due to AKI  VOZ:DGUYQIHK statin  Obesity: Body mass index is 34.81 kg/m.  ED/Hospital notes reviewed.     ROS:   Constitutional:  No f/c, No night sweats, No unexplained weight loss. EENT:  No vision changes, No blurry vision, No hearing changes. No mouth, throat, or ear problems.  Respiratory: mild cough, No SOB Cardiac: No CP, no palpitations GI:  No abd pain, No N/V/D. GU: No Urinary s/sx Musculoskeletal: No joint pain Neuro: No headache, no dizziness, no motor weakness.  Skin: No rash Endocrine:  No polydipsia. No polyuria.  Psych: Denies SI/HI  No problems updated.  ALLERGIES: No Known Allergies  PAST MEDICAL HISTORY: Past Medical History:  Diagnosis Date  . CAD (coronary artery disease)    a. NSTEM 09/2016: cath showing severe diffuse disease of the RCA, ramus and Cx with mild-mod disease of LAD; PCI would require multiple stents and significant contrast usage thus medical therapy recommended.  . Chronic diastolic CHF (congestive heart failure) ([place])   . CKD (chronic kidney disease), stage III ([place])   . Diabetes mellitus with neuropathy ([place])   . Foot amputation status ([place])    a. h/o left foot transmetatarsal amputation  . High cholesterol   . Hypertension    a. patent renal arteries by PV angio 08/2015. b. heavy proteinuria 09/2016 ? nephrotic.  [name] Normocytic anemia   . Obesity   .  Proteinuria   . PVD (peripheral vascular disease) ([place])    a. status post left SFA and popliteal stent and left SFA PTA with drug coated balloon in 08/2015.  . Tobacco abuse     MEDICATIONS AT HOME: Prior to Admission medications     Medication Sig Start Date End Date Taking? Authorizing Provider  amLODipine (NORVASC) 10 MG tablet Take 1 tablet (10 mg total) by mouth daily. Reported on 07/13/2015 12/31/16 12/31/17 Yes Arnoldo Morale, MD  aspirin 81 MG EC tablet Take 1 tablet (81 mg total) by mouth daily. Reported on 07/13/2015 07/02/16  Yes Arnoldo Morale, MD  atorvastatin (LIPITOR) 80 MG tablet Take 1 tablet (80 mg total) by mouth daily. 12/31/16  Yes Arnoldo Morale, MD  carvedilol (COREG) 25 MG tablet Take 1 tablet (25 mg total) by mouth 2 (two) times daily. 12/31/16 12/31/17 Yes Arnoldo Morale, MD  Cholecalciferol (VITAMIN D-3) 1000 units CAPS Take 1,000 Units by mouth daily.    Yes [provider]  clopidogrel (PLAVIX) 75 MG tablet Take 1 tablet (75 mg total) by mouth daily. 12/31/16  Yes Arnoldo Morale, MD  docusate sodium (COLACE) 100 MG capsule TAKE ONE CAPSULE BY MOUTH TWICE A DAY 10/26/16  Yes Amao, [name], MD  furosemide (LASIX) 40 MG tablet Take 1 tablet (40 mg total) by mouth daily. 01/06/17 01/06/18 Yes Domenic Polite, MD  gabapentin (NEURONTIN) 300 MG capsule Take 1 capsule (300 mg total) by mouth 3 (three) times daily. 12/31/16  Yes Arnoldo Morale, MD  glipiZIDE (GLUCOTROL) 10 MG tablet Take 1 tablet (10 mg total) by mouth 2 (two) times daily before a meal. 12/31/16  Yes Amao, Enobong, MD  hydrALAZINE (APRESOLINE) 100 MG tablet Take 1 tablet (100 mg total) by mouth 2 (two) times daily. 12/31/16  Yes Arnoldo Morale, MD  isosorbide mononitrate (IMDUR) 60 MG 24 hr tablet Take 1 tablet (60 mg total) by mouth daily. 12/31/16  Yes Arnoldo Morale, MD  nitroGLYCERIN (NITROSTAT) 0.4 MG SL tablet Place 1 tablet (0.4 mg total) under the tongue every 5 (five) minutes as needed for chest pain. 10/26/16 01/24/17 Yes Dunn, Dayna N, PA-C  Omega-3 Fatty Acids (FISH OIL) 1000 MG CAPS Take 1,000 mg by mouth daily. Reported on 07/13/2015   Yes [provider]  sitaGLIPtin (JANUVIA) 50 MG tablet Take 1 tablet (50 mg total) by mouth  daily. 12/31/16  Yes Arnoldo Morale, MD  blood glucose meter kit and supplies KIT Dispense based on patient and insurance preference. Use up to four times daily as directed. (FOR ICD-9 250.00, 250.01). Patient not taking: Reported on 12/31/2016 07/25/15   Ward, Delice Bison, DO  Blood Glucose Monitoring Suppl (ACCU-CHEK AVIVA) device Use as instructed three times daily. Patient not taking: Reported on 12/31/2016 04/22/16   Arnoldo Morale, MD  glucose blood (ACCU-CHEK AVIVA) test strip Use as instructed three times daily before meals. Patient not taking: Reported on 12/31/2016 04/22/16   Arnoldo Morale, MD  Lancet Devices [facility]) lancets Use as instructed three times daily before meals. Patient not taking: Reported on 12/31/2016 04/22/16   Arnoldo Morale, MD     Objective:  EXAM:   Vitals:   01/14/17 0933  BP: (!) 178/76  Pulse: 67  Resp: 18  Temp: 98.2 F (36.8 C)  TempSrc: Oral  SpO2: 98%  Weight: 200 lb (90.7 kg)  Height: '5\' 4"'  (1.626 m)    General appearance : A&OX3. NAD. Non-toxic-appearing HEENT: Atraumatic and Normocephalic.  PERRLA. EOM intact.  TM clear B. Mouth-MMM,  post pharynx WNL w/o erythema, No PND. Neck: supple, no JVD. No cervical lymphadenopathy. No thyromegaly Chest/Lungs:  Breathing-non-labored, Good air entry bilaterally, breath sounds normal without rales, rhonchi, or wheezing  CVS: S1 S2 regular, no murmurs, gallops, rubs  Extremities: Bilateral Lower Ext shows no edema, both legs are warm to touch with = pulse throughout Neurology:  CN II-XII grossly intact, Non focal.   Psych:  TP linear. J/I WNL. Normal speech. Appropriate eye contact and affect.  Skin:  No Rash  Data Review Lab Results  Component Value Date   HGBA1C 7.8 12/31/2016   HGBA1C 8.8 (H) 09/14/2016   HGBA1C 9.4 09/02/2016     Assessment & Plan   1. Encounter for examination following treatment at hospital Improving.  Keep all f/up appts  2. Type 2 diabetes mellitus with  hyperosmolarity without coma, without long-term current use of insulin (HCC) suboptimal control-continue current medication that was just resumed.  Work on diet/cutting out sodas/sugars-counseled at [employer] on this - Glucose (CBG) - Basic metabolic panel  3. CKD (chronic kidney disease), stage III (HCC) - Basic metabolic panel  4. Normocytic anemia - CBC with Differential/Platelet  5.  htn-not controlled.  She has not taken meds today.  Compliance overall has been an issue.  She says she was running late for her appt today.  We have discussed target BP range and blood pressure goal. I have advised patient to check BP regularly and to call us back or report to clinic if the numbers are consistently higher than 140/90. We discussed the importance of compliance with medical therapy and DASH diet recommended, consequences of uncontrolled hypertension discussed.  Continue current regimen.  Keep appt with cardiology tomorrow  6.  Smoking-congratulated her on decision to quit and provided resources to help.      Patient have been counseled extensively about nutrition and exercise  Return for keep appt in [facility] with Dr Margarita Rana; see [place] sooner if needed.  The patient was given clear instructions to go to ER or return to medical center if symptoms don't improve, worsen or new problems develop. The patient verbalized understanding. The patient was told to call to get lab results if they haven't heard anything in the next week.     Freeman Caldron, PA-C [facility] [place], [place]   01/14/2017, 9:45 AM

## 2017-01-14 ENCOUNTER — Ambulatory Visit: Payer: Medicaid Other | Attending: Family Medicine | Admitting: Physician Assistant

## 2017-01-14 VITALS — BP 178/76 | HR 67 | Temp 98.2°F | Resp 18 | Ht 64.0 in | Wt 200.0 lb

## 2017-01-14 DIAGNOSIS — N183 Chronic kidney disease, stage 3 unspecified: Secondary | ICD-10-CM

## 2017-01-14 DIAGNOSIS — E876 Hypokalemia: Secondary | ICD-10-CM | POA: Diagnosis not present

## 2017-01-14 DIAGNOSIS — Z79899 Other long term (current) drug therapy: Secondary | ICD-10-CM | POA: Diagnosis not present

## 2017-01-14 DIAGNOSIS — F17200 Nicotine dependence, unspecified, uncomplicated: Secondary | ICD-10-CM

## 2017-01-14 DIAGNOSIS — I130 Hypertensive heart and chronic kidney disease with heart failure and stage 1 through stage 4 chronic kidney disease, or unspecified chronic kidney disease: Secondary | ICD-10-CM | POA: Insufficient documentation

## 2017-01-14 DIAGNOSIS — E669 Obesity, unspecified: Secondary | ICD-10-CM | POA: Insufficient documentation

## 2017-01-14 DIAGNOSIS — Z7984 Long term (current) use of oral hypoglycemic drugs: Secondary | ICD-10-CM | POA: Insufficient documentation

## 2017-01-14 DIAGNOSIS — Z7902 Long term (current) use of antithrombotics/antiplatelets: Secondary | ICD-10-CM | POA: Insufficient documentation

## 2017-01-14 DIAGNOSIS — E785 Hyperlipidemia, unspecified: Secondary | ICD-10-CM | POA: Insufficient documentation

## 2017-01-14 DIAGNOSIS — E1140 Type 2 diabetes mellitus with diabetic neuropathy, unspecified: Secondary | ICD-10-CM | POA: Insufficient documentation

## 2017-01-14 DIAGNOSIS — D649 Anemia, unspecified: Secondary | ICD-10-CM | POA: Insufficient documentation

## 2017-01-14 DIAGNOSIS — I2510 Atherosclerotic heart disease of native coronary artery without angina pectoris: Secondary | ICD-10-CM | POA: Diagnosis not present

## 2017-01-14 DIAGNOSIS — I10 Essential (primary) hypertension: Secondary | ICD-10-CM

## 2017-01-14 DIAGNOSIS — Z09 Encounter for follow-up examination after completed treatment for conditions other than malignant neoplasm: Secondary | ICD-10-CM | POA: Diagnosis not present

## 2017-01-14 DIAGNOSIS — E1151 Type 2 diabetes mellitus with diabetic peripheral angiopathy without gangrene: Secondary | ICD-10-CM | POA: Diagnosis not present

## 2017-01-14 DIAGNOSIS — Z6834 Body mass index (BMI) 34.0-34.9, adult: Secondary | ICD-10-CM | POA: Insufficient documentation

## 2017-01-14 DIAGNOSIS — Z720 Tobacco use: Secondary | ICD-10-CM | POA: Insufficient documentation

## 2017-01-14 DIAGNOSIS — I5032 Chronic diastolic (congestive) heart failure: Secondary | ICD-10-CM | POA: Diagnosis not present

## 2017-01-14 DIAGNOSIS — I214 Non-ST elevation (NSTEMI) myocardial infarction: Secondary | ICD-10-CM | POA: Insufficient documentation

## 2017-01-14 DIAGNOSIS — E1100 Type 2 diabetes mellitus with hyperosmolarity without nonketotic hyperglycemic-hyperosmolar coma (NKHHC): Secondary | ICD-10-CM

## 2017-01-14 DIAGNOSIS — N179 Acute kidney failure, unspecified: Secondary | ICD-10-CM | POA: Diagnosis not present

## 2017-01-14 DIAGNOSIS — J9601 Acute respiratory failure with hypoxia: Secondary | ICD-10-CM | POA: Insufficient documentation

## 2017-01-14 DIAGNOSIS — E1122 Type 2 diabetes mellitus with diabetic chronic kidney disease: Secondary | ICD-10-CM | POA: Insufficient documentation

## 2017-01-14 DIAGNOSIS — Z7982 Long term (current) use of aspirin: Secondary | ICD-10-CM | POA: Insufficient documentation

## 2017-01-14 DIAGNOSIS — Z89439 Acquired absence of unspecified foot: Secondary | ICD-10-CM | POA: Insufficient documentation

## 2017-01-14 LAB — GLUCOSE, POCT (MANUAL RESULT ENTRY): POC GLUCOSE: 104 mg/dL — AB (ref 70–99)

## 2017-01-14 NOTE — Patient Instructions (Addendum)
1-800-quitnow   Steps to Quit Smoking Smoking tobacco can be bad for your health. It can also affect almost every organ in your body. Smoking puts you and people around you at risk for many serious long-lasting (chronic) diseases. Quitting smoking is hard, but it is one of the best things that you can do for your health. It is never too late to quit. What are the benefits of quitting smoking? When you quit smoking, you lower your risk for getting serious diseases and conditions. They can include:  Lung cancer or lung disease.  Heart disease.  Stroke.  Heart attack.  Not being able to have children (infertility).  Weak bones (osteoporosis) and broken bones (fractures).  If you have coughing, wheezing, and shortness of breath, those symptoms may get better when you quit. You may also get sick less often. If you are pregnant, quitting smoking can help to lower your chances of having a baby of low birth weight. What can I do to help me quit smoking? Talk with your doctor about what can help you quit smoking. Some things you can do (strategies) include:  Quitting smoking totally, instead of slowly cutting back how much you smoke over a period of time.  Going to in-person counseling. You are more likely to quit if you go to many counseling sessions.  Using resources and support systems, such as: ? Database administrator with a Social worker. ? Phone quitlines. ? Careers information officer. ? Support groups or group counseling. ? Text messaging programs. ? Mobile phone apps or applications.  Taking medicines. Some of these medicines may have nicotine in them. If you are pregnant or breastfeeding, do not take any medicines to quit smoking unless your doctor says it is okay. Talk with your doctor about counseling or other things that can help you.  Talk with your doctor about using more than one strategy at the same time, such as taking medicines while you are also going to in-person counseling. This  can help make quitting easier. What things can I do to make it easier to quit? Quitting smoking might feel very hard at first, but there is a lot that you can do to make it easier. Take these steps:  Talk to your family and friends. Ask them to support and encourage you.  Call phone quitlines, reach out to support groups, or work with a Social worker.  Ask people who smoke to not smoke around you.  Avoid places that make you want (trigger) to smoke, such as: ? Bars. ? Parties. ? Smoke-break areas at work.  Spend time with people who do not smoke.  Lower the stress in your life. Stress can make you want to smoke. Try these things to help your stress: ? Getting regular exercise. ? Deep-breathing exercises. ? Yoga. ? Meditating. ? Doing a body scan. To do this, close your eyes, focus on one area of your body at a time from head to toe, and notice which parts of your body are tense. Try to relax the muscles in those areas.  Download or buy apps on your mobile phone or tablet that can help you stick to your quit plan. There are many free apps, such as QuitGuide from the [employer] Office manager for Disease Control and Prevention). You can find more support from smokefree.gov and other websites.  This information is not intended to replace advice given to you by your health care provider. Make sure you discuss any questions you have with your health care provider. Document  Released: 10/18/2008 Document Revised: 08/20/2015 Document Reviewed: 05/08/2014 Elsevier Interactive Patient Education  [employer].

## 2017-01-15 ENCOUNTER — Ambulatory Visit (INDEPENDENT_AMBULATORY_CARE_PROVIDER_SITE_OTHER): Payer: Medicaid Other | Admitting: Cardiology

## 2017-01-15 ENCOUNTER — Encounter: Payer: Self-pay | Admitting: Cardiology

## 2017-01-15 VITALS — BP 152/58 | HR 66 | Ht 64.0 in | Wt 201.0 lb

## 2017-01-15 DIAGNOSIS — I5033 Acute on chronic diastolic (congestive) heart failure: Secondary | ICD-10-CM

## 2017-01-15 DIAGNOSIS — I130 Hypertensive heart and chronic kidney disease with heart failure and stage 1 through stage 4 chronic kidney disease, or unspecified chronic kidney disease: Secondary | ICD-10-CM | POA: Diagnosis not present

## 2017-01-15 DIAGNOSIS — N183 Chronic kidney disease, stage 3 unspecified: Secondary | ICD-10-CM

## 2017-01-15 DIAGNOSIS — I2510 Atherosclerotic heart disease of native coronary artery without angina pectoris: Secondary | ICD-10-CM

## 2017-01-15 DIAGNOSIS — I739 Peripheral vascular disease, unspecified: Secondary | ICD-10-CM | POA: Diagnosis not present

## 2017-01-15 LAB — CBC WITH DIFFERENTIAL/PLATELET
BASOS ABS: 0.1 10*3/uL (ref 0.0–0.2)
Basos: 1 %
EOS (ABSOLUTE): 0.2 10*3/uL (ref 0.0–0.4)
EOS: 2 %
HEMATOCRIT: 35.9 % (ref 34.0–46.6)
Hemoglobin: 11.6 g/dL (ref 11.1–15.9)
IMMATURE GRANULOCYTES: 0 %
Immature Grans (Abs): 0 10*3/uL (ref 0.0–0.1)
LYMPHS ABS: 2.6 10*3/uL (ref 0.7–3.1)
Lymphs: 25 %
MCH: 26.1 pg — ABNORMAL LOW (ref 26.6–33.0)
MCHC: 32.3 g/dL (ref 31.5–35.7)
MCV: 81 fL (ref 79–97)
MONOS ABS: 0.6 10*3/uL (ref 0.1–0.9)
Monocytes: 6 %
NEUTROS PCT: 66 %
Neutrophils Absolute: 6.6 10*3/uL (ref 1.4–7.0)
PLATELETS: 308 10*3/uL (ref 150–379)
RBC: 4.44 x10E6/uL (ref 3.77–5.28)
RDW: 14.9 % (ref 12.3–15.4)
WBC: 10.2 10*3/uL (ref 3.4–10.8)

## 2017-01-15 LAB — BASIC METABOLIC PANEL
BUN/Creatinine Ratio: 9 (ref 9–23)
BUN: 19 mg/dL (ref 6–24)
CHLORIDE: 107 mmol/L — AB (ref 96–106)
CO2: 23 mmol/L (ref 20–29)
CREATININE: 2.21 mg/dL — AB (ref 0.57–1.00)
Calcium: 9.2 mg/dL (ref 8.7–10.2)
GFR calc Af Amer: 27 mL/min/{1.73_m2} — ABNORMAL LOW (ref >59)
GFR calc non Af Amer: 24 mL/min/{1.73_m2} — ABNORMAL LOW (ref >59)
GLUCOSE: 116 mg/dL — AB (ref 65–99)
Potassium: 4.3 mmol/L (ref 3.5–5.2)
SODIUM: 143 mmol/L (ref 134–144)

## 2017-01-15 MED ORDER — ISOSORBIDE MONONITRATE ER 30 MG PO TB24: 90.0000 mg | ORAL_TABLET | Freq: Every day | ORAL | 0 refills | Status: DC

## 2017-01-15 MED ORDER — NITROGLYCERIN 0.4 MG SL SUBL: 0.4000 mg | SUBLINGUAL_TABLET | SUBLINGUAL | 3 refills | Status: DC | PRN

## 2017-01-15 MED ORDER — FUROSEMIDE 40 MG PO TABS
ORAL_TABLET | ORAL | 0 refills | Status: DC
Start: 2017-01-15 — End: 2017-01-18

## 2017-01-15 NOTE — Patient Instructions (Signed)
Medication Instructions:   INCREASE YOUR LASIX TO 40 MG BY MOUTH TWICE DAILY FOR 7 DAYS ONLY, THEN GO BACK TO TAKING LASIX 40 MG BY MOUTH DAILY THEREAFTER.  INCREASE YOUR ISOSORBIDE MONONITRATE (IMDUR) TO 90 MG BY MOUTH DAILY.    Labwork:  IN 2 WEEKS--SAME DAY AS YOU SEE THE PA-C IN OUR OFFICE--WILL CHECK BMET & PRO-BNP     Follow-Up:  WITH VIN BHAGAT PA-C IN 2 WEEKS, OR AN EXTENDER IN OUR OFFICE--PLEASE HAVE YOUR LABS DONE SAME DAY AS THIS OFFICE VISIT       If you need a refill on your cardiac medications before your next appointment, please call your pharmacy.

## 2017-01-15 NOTE — Progress Notes (Signed)
Cardiology Office Note    Date:  01/15/2017   ID:  Nicol Herbig, DOB [birth year], MRN [id number]  PCP:  Arnoldo Morale, MD  Cardiologist:  Dr. [name]   Chief Complaint: Hospital follow up CHF  History of Present Illness:   Rebekah Johnson is a 57 y.o. female  with HTN (patent renal arteries by PV angio 08/2015), uncontrolled DM2, hyperlipidemia, CKD stage III by labs, diabetic neuropathy, PVD (status post left SFA and popliteal stent and left SFA PTA with drug coated balloon in 08/2015), left foot transmetatarsal amputation, tobacco abuse, obesity presents for hospital follow up.   Admitted 09/2016 with acute on chronic diastolic CHF, sec to noncompliance - out of medications for 3 days. Found to have acute hypoxic respiratory failure with O2 sats 70s requiring bipap.  Felt to have new onset CHF, also with troponin of 2. EF 55-60% G2DD. Cath showed 100% prox CX to mid CX stenosis, RCA with 80% stenosis other with nonobstructive as well.  PCI would require multiple stents and significant contrast, recommendws to treat medically. UA concerning for nephrotic process. Outpatient referral to nephrology.   01/15/17 - Admitted 01/01/17 with another acute on chronic diastolic CHF - and NSTEMI,  Maximum troponin 4.9, because of previous cath findings  And CK D stage III conservative management was decided. She was diuresed..  Today she states that she feels better,  , she has minimal lower extremity edema,  Stable shortness of breath no orthopnea or paroxysmal nocturnal dyspnea,  No chest pain. She has been compliant with her medications. No fever or chills, she continues to cough , and her cough is productive of clear sputum.   Past Medical History:  Diagnosis Date  . CAD (coronary artery disease)    a. NSTEM 09/2016: cath showing severe diffuse disease of the RCA, ramus and Cx with mild-mod disease of LAD; PCI would require multiple stents and significant contrast usage thus medical therapy recommended.   . Chronic diastolic CHF (congestive heart failure) ([place])   . CKD (chronic kidney disease), stage III ([place])   . Diabetes mellitus with neuropathy ([place])   . Foot amputation status ([place])    a. h/o left foot transmetatarsal amputation  . High cholesterol   . Hypertension    a. patent renal arteries by PV angio 08/2015. b. heavy proteinuria 09/2016 ? nephrotic.  [name] Normocytic anemia   . Obesity   . Proteinuria   . PVD (peripheral vascular disease) ([place])    a. status post left SFA and popliteal stent and left SFA PTA with drug coated balloon in 08/2015.  . Tobacco abuse     Past Surgical History:  Procedure Laterality Date  . ABDOMINAL HYSTERECTOMY    . AMPUTATION Left 09/06/2015   Procedure: LEFT TRANSMETATARSAL AMPUTATTION;  Surgeon: Newt Minion, MD;  Location: [facility];  Service: Orthopedics;  Laterality: Left;  . LEFT HEART CATH AND CORONARY ANGIOGRAPHY N/A 09/17/2016   Procedure: LEFT HEART CATH AND CORONARY ANGIOGRAPHY;  Surgeon: Jettie Booze, MD;  Location: [facility];  Service: Cardiovascular;  Laterality: N/A;  . PERIPHERAL VASCULAR CATHETERIZATION N/A 08/16/2015   Procedure: Abdominal Aortogram;  Surgeon: Elam Dutch, MD;  Location: [facility];  Service: Cardiovascular;  Laterality: N/A;  . PERIPHERAL VASCULAR CATHETERIZATION Bilateral 08/16/2015   Procedure: Lower Extremity Angiography;  Surgeon: Elam Dutch, MD;  Location: [facility] [facility];  Service: Cardiovascular;  Laterality: Bilateral;  . PERIPHERAL VASCULAR CATHETERIZATION Left 08/16/2015   Procedure: Peripheral  Vascular Intervention;  Surgeon: Elam Dutch, MD;  Location: [facility];  Service: Cardiovascular;  Laterality: Left;  SFA STENT X 2    Current Medications: Prior to Admission medications   Medication Sig Start Date End Date Taking? Authorizing Provider  amLODipine (NORVASC) 10 MG tablet Take 1 tablet (10 mg total) by mouth daily. Reported on 07/13/2015 09/02/16 09/02/17  Arnoldo Morale, MD  aspirin 81 MG EC tablet Take 1 tablet (81 mg total) by mouth daily. Reported on 07/13/2015 07/02/16   Arnoldo Morale, MD  atorvastatin (LIPITOR) 80 MG tablet Take 1 tablet (80 mg total) by mouth daily. 09/19/16   Cherene Altes, MD  blood glucose meter kit and supplies KIT Dispense based on patient and insurance preference. Use up to four times daily as directed. (FOR ICD-9 250.00, 250.01). 07/25/15   Ward, Delice Bison, DO  Blood Glucose Monitoring Suppl (ACCU-CHEK AVIVA) device Use as instructed three times daily. 04/22/16   Arnoldo Morale, MD  carvedilol (COREG) 12.5 MG tablet Take 1 tablet (12.5 mg total) by mouth 2 (two) times daily. 09/23/16 09/23/17  Arnoldo Morale, MD  Cholecalciferol (VITAMIN D-3) 1000 units CAPS Take 1,000 Units by mouth daily.     [provider]  clopidogrel (PLAVIX) 75 MG tablet Take 1 tablet (75 mg total) by mouth daily. 09/02/16   Arnoldo Morale, MD  docusate sodium (COLACE) 100 MG capsule TAKE 1 CAPSULE (100 MG) BY MOUTH TWICE A DAY 09/23/16   Arnoldo Morale, MD  gabapentin (NEURONTIN) 300 MG capsule Take 1 capsule (300 mg total) by mouth 3 (three) times daily. 09/18/16   Cherene Altes, MD  glipiZIDE (GLUCOTROL) 10 MG tablet Take 1 tablet (10 mg total) by mouth 2 (two) times daily before a meal. 09/02/16   Arnoldo Morale, MD  glucose blood (ACCU-CHEK AVIVA) test strip Use as instructed three times daily before meals. 04/22/16   Arnoldo Morale, MD  hydrALAZINE (APRESOLINE) 50 MG tablet Take 1 tablet (50 mg total) by mouth 3 (three) times daily. 09/18/16   Cherene Altes, MD  isosorbide mononitrate (IMDUR) 60 MG 24 hr tablet Take 1 tablet (60 mg total) by mouth daily. 09/02/16   Arnoldo Morale, MD  Lancet Devices [facility]) lancets Use as instructed three times daily before meals. 04/22/16   Arnoldo Morale, MD  Omega-3 Fatty Acids (FISH OIL) 1000 MG CAPS Take 1,000 mg by mouth daily. Reported on 07/13/2015    [provider]  sitaGLIPtin  (JANUVIA) 50 MG tablet Take 1 tablet (50 mg total) by mouth daily. 09/02/16   Arnoldo Morale, MD    Allergies:   Patient has no known allergies.   Social History   Socioeconomic History  . Marital status: Married    Spouse name: None  . Number of children: None  . Years of education: None  . Highest education level: None  Social Needs  . Financial resource strain: None  . Food insecurity - worry: None  . Food insecurity - inability: None  . Transportation needs - medical: None  . Transportation needs - non-medical: None  Occupational History  . None  Tobacco Use  . Smoking status: Former Smoker    Packs/day: 0.00    Types: Cigarettes    Last attempt to quit: 01/13/2017  . Smokeless tobacco: Never Used  . Tobacco comment: Down to 3 cigarettes per day  Substance and Sexual Activity  . Alcohol use: Yes    Alcohol/week: 0.6 - 1.2 oz  Types: 1 - 2 Glasses of wine per week    Comment: on social occassions  . Drug use: No  . Sexual activity: Yes    Partners: Male  Other Topics Concern  . None  Social History Narrative  . None     Family History:  The patient's family history includes Diabetes in her mother; Hypertension in her father.   ROS:   Please see the history of present illness.    ROS All other systems reviewed and are negative.   PHYSICAL EXAM:   VS:  BP (!) 152/58   Pulse 66   Ht '5\' 4"'  (1.626 m)   Wt 201 lb (91.2 kg)   BMI 34.50 kg/m    GEN: Well nourished, well developed, in no acute distress  HEENT: normal  Neck: no JVD, carotid bruits, or masses Cardiac: RRR; no murmurs, rubs, or gallops, trace edema B/L, compression stockings on B/L Respiratory:  Crackles bilaterally, normal work of breathing GI: soft, nontender, nondistended, + BS MS: no deformity or atrophy  Skin: warm and dry, no rash Neuro:  Alert and Oriented x 3, Strength and sensation are intact Psych: euthymic mood, full affect  Wt Readings from Last 3 Encounters:  01/15/17 201 lb (91.2  kg)  01/14/17 200 lb (90.7 kg)  01/06/17 195 lb 1.6 oz (88.5 kg)      Studies/Labs Reviewed:   EKG:  EKG is not ordered today.    Recent Labs: 09/14/2016: TSH 0.959 01/01/2017: ALT 22; B Natriuretic Peptide 962.9; Magnesium 1.7 01/14/2017: BUN 19; Creatinine, Ser 2.21; Hemoglobin 11.6; Platelets 308; Potassium 4.3; Sodium 143   Lipid Panel    Component Value Date/Time   CHOL 93 09/16/2016 0559   TRIG 163 (H) 09/16/2016 0559   HDL 25 (L) 09/16/2016 0559   CHOLHDL 3.7 09/16/2016 0559   VLDL 33 09/16/2016 0559   LDLCALC 35 09/16/2016 0559    Additional studies/ records that were reviewed today include:   LEFT HEART CATH AND CORONARY ANGIOGRAPHY  Conclusion     Ost 1st Diag lesion, 25 %stenosed.  Ost 2nd Diag lesion, 25 %stenosed.  Ramus-1 lesion, 50 %stenosed.  Ramus-2 lesion, 80 %stenosed.  Prox Cx to Mid Cx lesion, 100 %stenosed.  Prox RCA to Mid RCA lesion, 80 %stenosed.  RPDA lesion, 80 %stenosed.  Dist RCA lesion, 80 %stenosed.  Mid RCA to Dist RCA lesion, 80 %stenosed.  LV end diastolic pressure is mildly elevated.  There is no aortic valve stenosis.  Severe diffuse disease of the RCA, Ramus and circumflex. LAD with only mild to moderate diffuse disease. PCI would require multiple stents and significant contrast usage. For now, would treat medically.     Echo 09/15/16 Study Conclusions  - Left ventricle: The cavity size was normal. Wall thickness was increased in a pattern of mild LVH. Systolic function was normal. The estimated ejection fraction was in the range of 55% to 60%. There is akinesis of the basal-midinferolateral myocardium. Features are consistent with a pseudonormal left ventricular filling pattern, with concomitant abnormal relaxation and increased filling pressure (grade 2 diastolic dysfunction). Doppler parameters are consistent with high ventricular filling pressure. - Mitral valve: There was mild  regurgitation. - Left atrium: The atrium was mildly dilated.  Impressions:  - Akinesis of the basal/mid inferolateral wall with overall preserved LV systolic function; moderate diastolic dysfunction; mild LVH; mild MR; mild LAE.    ASSESSMENT & PLAN:    1. Acute on chronic diastolic CHF - NYHA IIb,  With  some signs of fluid overload, I will increase lasix 40 mg daily to PO BID, follow up in 2 weeks with BMP and BNP at that time. 2. CAD  - Cath as discussed above. Plan for medical therapy. Her dyspnea on exertion is improving. Continue aspirin, Plavix, beta blocker, Imdur and statin. May consider PCI once evaluated by nephrologist or worsening of symptoms.  3. Hypertensive heart disease - elevated. Increase Imdur to 90 mg po daily. 4. PVD - on Plavix 5. CKD stage III - pending evaluation by nephrologist. Workup per PCP - most recent Crea 2.1 down from 2.5 6. Tobacco smoking - She has cut back. Advised complete cessation.   Medication Adjustments/Labs and Tests Ordered: Current medicines are reviewed at length with the patient today.  Concerns regarding medicines are outlined above.  Medication changes, Labs and Tests ordered today are listed in the Patient Instructions below. Patient Instructions  Medication Instructions:   INCREASE YOUR LASIX TO 40 MG BY MOUTH TWICE DAILY FOR 7 DAYS ONLY, THEN GO BACK TO TAKING LASIX 40 MG BY MOUTH DAILY THEREAFTER.  INCREASE YOUR ISOSORBIDE MONONITRATE (IMDUR) TO 90 MG BY MOUTH DAILY.    Labwork:  IN 2 WEEKS--SAME DAY AS YOU SEE THE PA-C IN OUR OFFICE--WILL CHECK BMET & PRO-BNP     Follow-Up:  WITH VIN BHAGAT PA-C IN 2 WEEKS, OR AN EXTENDER IN OUR OFFICE--PLEASE HAVE YOUR LABS DONE SAME DAY AS THIS OFFICE VISIT       If you need a refill on your cardiac medications before your next appointment, please call your pharmacy.      Signed, Ena Dawley, MD  01/15/2017 12:57 PM    [address], [place], [place]  [address] Phone: [phone]; Fax: [phone]

## 2017-01-18 ENCOUNTER — Telehealth: Payer: Self-pay | Admitting: *Deleted

## 2017-01-18 DIAGNOSIS — I2510 Atherosclerotic heart disease of native coronary artery without angina pectoris: Secondary | ICD-10-CM

## 2017-01-18 DIAGNOSIS — I130 Hypertensive heart and chronic kidney disease with heart failure and stage 1 through stage 4 chronic kidney disease, or unspecified chronic kidney disease: Secondary | ICD-10-CM

## 2017-01-18 DIAGNOSIS — I5033 Acute on chronic diastolic (congestive) heart failure: Secondary | ICD-10-CM

## 2017-01-18 MED ORDER — NITROGLYCERIN 0.4 MG SL SUBL: 0.4000 mg | SUBLINGUAL_TABLET | SUBLINGUAL | 3 refills | Status: AC | PRN

## 2017-01-18 MED ORDER — FUROSEMIDE 40 MG PO TABS: ORAL_TABLET | ORAL | 0 refills | Status: DC

## 2017-01-18 MED ORDER — ISOSORBIDE MONONITRATE ER 30 MG PO TB24: 90.0000 mg | ORAL_TABLET | Freq: Every day | ORAL | 0 refills | Status: DC

## 2017-01-18 MED FILL — ISOSORBIDE MN ER 30 MG TAB: 30 | 30 days supply | Qty: 90 | Fill #0

## 2017-01-18 MED FILL — NITROSTAT 0.4 MG TABLET SL: 0.4 | 20 days supply | Qty: 25 | Fill #0

## 2017-01-18 NOTE — Telephone Encounter (Signed)
[facility] called to say that there was an issue with receiving the pts medications Dr [name] prescribed on last Friday.  Pharmacist endorsed that their has been a problem with their system Pharmacist requesting for lasix, imdur, and Nitro to be resent back to their pharmacy.  Sent orders back in, and Pharmacist at [facility] confirmed that they received these scripts, and will fill them now and call the pt to pick up.  Pharmacist gracious for all the assistance provided.

## 2017-01-19 ENCOUNTER — Other Ambulatory Visit: Payer: Self-pay

## 2017-01-19 ENCOUNTER — Telehealth: Payer: Self-pay | Admitting: Family Medicine

## 2017-01-19 ENCOUNTER — Telehealth: Payer: Self-pay

## 2017-01-19 NOTE — Telephone Encounter (Signed)
Pt was called and informed to contact [employer] for a new meter due to insurance not covering a replacement meter.

## 2017-01-19 NOTE — Telephone Encounter (Signed)
Pt called to request a new pen and sugar machine because she lost them. Please follow [phone]

## 2017-01-19 NOTE — Telephone Encounter (Signed)
Sure

## 2017-01-19 NOTE — Telephone Encounter (Signed)
Can I refill for patient?

## 2017-01-20 ENCOUNTER — Ambulatory Visit (HOSPITAL_COMMUNITY)
Admission: RE | Admit: 2017-01-20 | Discharge: 2017-01-20 | Disposition: A | Discharge status: Home / Self Care | Payer: Medicaid Other | Source: Ambulatory Visit | Attending: Oncology | Admitting: Oncology

## 2017-01-20 ENCOUNTER — Inpatient Hospital Stay: Payer: Medicaid Other | Attending: Oncology | Admitting: Oncology

## 2017-01-20 ENCOUNTER — Telehealth: Payer: Self-pay | Admitting: Oncology

## 2017-01-20 VITALS — BP 153/72 | HR 65 | Temp 98.0°F | Resp 20 | Ht 64.0 in | Wt 198.9 lb

## 2017-01-20 DIAGNOSIS — M899 Disorder of bone, unspecified: Secondary | ICD-10-CM | POA: Diagnosis not present

## 2017-01-20 DIAGNOSIS — M5137 Other intervertebral disc degeneration, lumbosacral region: Secondary | ICD-10-CM | POA: Diagnosis not present

## 2017-01-20 DIAGNOSIS — E1122 Type 2 diabetes mellitus with diabetic chronic kidney disease: Secondary | ICD-10-CM | POA: Diagnosis not present

## 2017-01-20 DIAGNOSIS — E669 Obesity, unspecified: Secondary | ICD-10-CM | POA: Insufficient documentation

## 2017-01-20 DIAGNOSIS — I130 Hypertensive heart and chronic kidney disease with heart failure and stage 1 through stage 4 chronic kidney disease, or unspecified chronic kidney disease: Secondary | ICD-10-CM | POA: Diagnosis not present

## 2017-01-20 DIAGNOSIS — I5032 Chronic diastolic (congestive) heart failure: Secondary | ICD-10-CM | POA: Insufficient documentation

## 2017-01-20 DIAGNOSIS — R748 Abnormal levels of other serum enzymes: Secondary | ICD-10-CM | POA: Insufficient documentation

## 2017-01-20 DIAGNOSIS — D472 Monoclonal gammopathy: Secondary | ICD-10-CM | POA: Insufficient documentation

## 2017-01-20 DIAGNOSIS — I2510 Atherosclerotic heart disease of native coronary artery without angina pectoris: Secondary | ICD-10-CM | POA: Insufficient documentation

## 2017-01-20 DIAGNOSIS — M50321 Other cervical disc degeneration at C4-C5 level: Secondary | ICD-10-CM | POA: Insufficient documentation

## 2017-01-20 DIAGNOSIS — E1140 Type 2 diabetes mellitus with diabetic neuropathy, unspecified: Secondary | ICD-10-CM | POA: Insufficient documentation

## 2017-01-20 DIAGNOSIS — N183 Chronic kidney disease, stage 3 (moderate): Secondary | ICD-10-CM | POA: Diagnosis not present

## 2017-01-20 NOTE — Progress Notes (Signed)
Reason for Referral: Evaluation for plasma cell disorder.  HPI: 57 year old woman native of [place] but currently lives in [place].  She is a pleasant woman with history of diabetes, hypertension and coronary artery disease.  She was seen by Dr. Moshe Cipro for evaluation for her stage III chronic kidney disease.  Part of her evaluation included a serum protein electrophoresis in December 2018.  At that time she was noted to have an M spike of 0.5 g/dL.  Immunofixation showed an IgG kappa light chain with abnormal IgG level of 2300.  IgA and IgM all within normal range.  Her urine team electrophoresis is abnormal as well with an M spike of 4.1 immunofixation confirmed the presence of IgG kappa light chains.  At that time her creatinine was 2.17 with a BUN of 25.  Calcium was normal with normal liver function test.  The globin at the time was 12.8.  Repeat laboratory testing done on January 14, 2017 showed a creatinine of 2.2 with normal calcium at 9.2.  Her hemoglobin is 11.6 with a normal differential of her white cell count.  She is asymptomatic otherwise from these findings.  She denies any bone pain or pathological fractures.  She denies any opportunistic infections.  She was recently discharged on January 06, 2017 after presenting with symptoms of shortness of breath and a respiratory failure due to pneumonia possible influenza.  She has recovered at this time unable to perform most activities of daily living.  She denies any headaches, blurry vision, syncope or seizures.  She does not report any fevers, chills, sweats or weight loss.  She does not report any chest pain, palpitation, orthopnea or leg edema.  She does not report any cough, wheezing or hemoptysis.  She does not report any nausea, vomiting or abdominal pain.  She does not report any frequency urgency or hesitancy.  She does not report any skeletal complaints.  She does not report any heat or cold intolerance.  She does not report  any skin rashes or lesions.  She does not report lymphadenopathy or petechiae.  Remaining review of systems unremarkable.  Past Medical History:  Diagnosis Date  . CAD (coronary artery disease)    a. NSTEM 09/2016: cath showing severe diffuse disease of the RCA, ramus and Cx with mild-mod disease of LAD; PCI would require multiple stents and significant contrast usage thus medical therapy recommended.  . Chronic diastolic CHF (congestive heart failure) ([place])   . CKD (chronic kidney disease), stage III ([place])   . Diabetes mellitus with neuropathy ([place])   . Foot amputation status ([place])    a. h/o left foot transmetatarsal amputation  . High cholesterol   . Hypertension    a. patent renal arteries by PV angio 08/2015. b. heavy proteinuria 09/2016 ? nephrotic.  [name] Normocytic anemia   . Obesity   . Proteinuria   . PVD (peripheral vascular disease) ([place])    a. status post left SFA and popliteal stent and left SFA PTA with drug coated balloon in 08/2015.  . Tobacco abuse   :  Past Surgical History:  Procedure Laterality Date  . ABDOMINAL HYSTERECTOMY    . AMPUTATION Left 09/06/2015   Procedure: LEFT TRANSMETATARSAL AMPUTATTION;  Surgeon: Newt Minion, MD;  Location: [facility];  Service: Orthopedics;  Laterality: Left;  . LEFT HEART CATH AND CORONARY ANGIOGRAPHY N/A 09/17/2016   Procedure: LEFT HEART CATH AND CORONARY ANGIOGRAPHY;  Surgeon: Jettie Booze, MD;  Location: [facility];  Service: Cardiovascular;  Laterality: N/A;  . PERIPHERAL VASCULAR CATHETERIZATION N/A 08/16/2015   Procedure: Abdominal Aortogram;  Surgeon: Elam Dutch, MD;  Location: [facility];  Service: Cardiovascular;  Laterality: N/A;  . PERIPHERAL VASCULAR CATHETERIZATION Bilateral 08/16/2015   Procedure: Lower Extremity Angiography;  Surgeon: Elam Dutch, MD;  Location: [facility];  Service: Cardiovascular;  Laterality: Bilateral;  . PERIPHERAL VASCULAR CATHETERIZATION Left 08/16/2015    Procedure: Peripheral Vascular Intervention;  Surgeon: Elam Dutch, MD;  Location: [facility];  Service: Cardiovascular;  Laterality: Left;  SFA STENT X 2  :   Current Outpatient Medications:  .  amLODipine (NORVASC) 10 MG tablet, Take 1 tablet (10 mg total) by mouth daily. Reported on 07/13/2015, Disp: 30 tablet, Rfl: 3 .  aspirin 81 MG EC tablet, Take 1 tablet (81 mg total) by mouth daily. Reported on 07/13/2015, Disp: 90 tablet, Rfl: 0 .  atorvastatin (LIPITOR) 80 MG tablet, Take 1 tablet (80 mg total) by mouth daily., Disp: 30 tablet, Rfl: 3 .  blood glucose meter kit and supplies KIT, Dispense based on patient and insurance preference. Use up to four times daily as directed. (FOR ICD-9 250.00, 250.01)., Disp: 1 each, Rfl: 3 .  Blood Glucose Monitoring Suppl (ACCU-CHEK AVIVA) device, Use as instructed three times daily., Disp: 1 each, Rfl: 0 .  carvedilol (COREG) 25 MG tablet, Take 1 tablet (25 mg total) by mouth 2 (two) times daily., Disp: 60 tablet, Rfl: 3 .  Cholecalciferol (VITAMIN D-3) 1000 units CAPS, Take 1,000 Units by mouth daily. , Disp: , Rfl:  .  clopidogrel (PLAVIX) 75 MG tablet, Take 1 tablet (75 mg total) by mouth daily., Disp: 30 tablet, Rfl: 3 .  docusate sodium (COLACE) 100 MG capsule, TAKE ONE CAPSULE BY MOUTH TWICE A DAY, Disp: 60 capsule, Rfl: 0 .  furosemide (LASIX) 40 MG tablet, Take 40 mg by mouth twice daily for 7 days only, then go back to taking 40 mg by mouth daily thereafter., Disp: 37 tablet, Rfl: 0 .  gabapentin (NEURONTIN) 300 MG capsule, Take 1 capsule (300 mg total) by mouth 3 (three) times daily., Disp: 90 capsule, Rfl: 3 .  glipiZIDE (GLUCOTROL) 10 MG tablet, Take 1 tablet (10 mg total) by mouth 2 (two) times daily before a meal., Disp: 60 tablet, Rfl: 3 .  glucose blood (ACCU-CHEK AVIVA) test strip, Use as instructed three times daily before meals., Disp: 100 each, Rfl: 12 .  hydrALAZINE (APRESOLINE) 100 MG tablet, Take 1 tablet (100 mg total) by  mouth 2 (two) times daily., Disp: 60 tablet, Rfl: 3 .  isosorbide mononitrate (IMDUR) 30 MG 24 hr tablet, Take 3 tablets (90 mg total) by mouth daily., Disp: 270 tablet, Rfl: 0 .  Lancet Devices (ACCU-CHEK SOFTCLIX) lancets, Use as instructed three times daily before meals., Disp: 1 each, Rfl: 5 .  nitroGLYCERIN (NITROSTAT) 0.4 MG SL tablet, Place 1 tablet (0.4 mg total) under the tongue every 5 (five) minutes as needed for chest pain., Disp: 25 tablet, Rfl: 3 .  Omega-3 Fatty Acids (FISH OIL) 1000 MG CAPS, Take 1,000 mg by mouth daily. Reported on 07/13/2015, Disp: , Rfl:  .  sitaGLIPtin (JANUVIA) 50 MG tablet, Take 1 tablet (50 mg total) by mouth daily., Disp: 30 tablet, Rfl: 3:  No Known Allergies:  Family History  Problem Relation Age of Onset  . Diabetes Mother   . Hypertension Father   :  Social History   Socioeconomic History  .  Marital status: Married    Spouse name: Not on file  . Number of children: Not on file  . Years of education: Not on file  . Highest education level: Not on file  Social Needs  . Financial resource strain: Not on file  . Food insecurity - worry: Not on file  . Food insecurity - inability: Not on file  . Transportation needs - medical: Not on file  . Transportation needs - non-medical: Not on file  Occupational History  . Not on file  Tobacco Use  . Smoking status: Former Smoker    Packs/day: 0.00    Types: Cigarettes    Last attempt to quit: 01/13/2017    Years since quitting: 0.0  . Smokeless tobacco: Never Used  . Tobacco comment: Down to 3 cigarettes per day  Substance and Sexual Activity  . Alcohol use: Yes    Alcohol/week: 0.6 - 1.2 oz    Types: 1 - 2 Glasses of wine per week    Comment: on social occassions  . Drug use: No  . Sexual activity: Yes    Partners: Male  Other Topics Concern  . Not on file  Social History Narrative  . Not on file  :  Pertinent items are noted in HPI.  Exam: Blood pressure (!) 153/72, pulse 65,  temperature 98 F (36.7 C), temperature source Oral, resp. rate 20, height _0  (1.626 m), weight 198 lb 14.4 oz (90.2 kg), SpO2 100 %.  ECOG 1 General appearance: alert and cooperative appeared without distress. Eyes: conjunctivae/corneas clear. PERRL,  Throat: lips, mucosa, and tongue normal; no thrush or ulcers. Resp: clear to auscultation bilaterally rhonchi or wheezes. Cardio: regular rate and rhythm, S1, S2 normal, no murmur, click, rub or gallop GI: soft, non-tender; bowel sounds normal; no masses,  no organomegaly Extremities: extremities normal, atraumatic, no cyanosis or edema Skin: Skin color, texture, turgor normal. No rashes or lesions.  No petechiae or rash. Lymph nodes: Cervical, supraclavicular, and axillary nodes normal. Neurologic: Grossly normal  CBC    Component Value Date/Time   WBC 10.2 01/14/2017 0958   WBC 5.6 01/04/2017 0551   RBC 4.44 01/14/2017 0958   RBC 4.05 01/04/2017 0551   HGB 11.6 01/14/2017 0958   HCT 35.9 01/14/2017 0958   PLT 308 01/14/2017 0958   MCV 81 01/14/2017 0958   MCH 26.1 (L) 01/14/2017 0958   MCH 26.2 01/04/2017 0551   MCHC 32.3 01/14/2017 0958   MCHC 32.3 01/04/2017 0551   RDW 14.9 01/14/2017 0958   LYMPHSABS 2.6 01/14/2017 0958   MONOABS 1.1 (H) 01/01/2017 0917   EOSABS 0.2 01/14/2017 0958   BASOSABS 0.1 01/14/2017 0958     Chemistry      Component Value Date/Time   NA 143 01/14/2017 0958   K 4.3 01/14/2017 0958   CL 107 (H) 01/14/2017 0958   CO2 23 01/14/2017 0958   BUN 19 01/14/2017 0958   CREATININE 2.21 (H) 01/14/2017 0958   CREATININE 1.19 (H) 12/18/2015 1006      Component Value Date/Time   CALCIUM 9.2 01/14/2017 0958   ALKPHOS 105 01/01/2017 0917   AST 33 01/01/2017 0917   ALT 22 01/01/2017 0917   BILITOT 0.6 01/01/2017 0917   BILITOT <0.2 05/08/2016 0957        Assessment and Plan:   57 year old woman following issues:  1.  Abnormal protein electrophoresis.  He was found to have a 0.5 g/dL on a  serum protein electrophoresis with IgG  kappa immunofixation.  The differential diagnosis includes monoclonal gammopathy of undetermined significance, amyloidosis or multiple myeloma.  The natural course of plasma cell disorders were reviewed today with the patient in detail.  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  I see no evidence to suggest endorgan damage however she would require a skeletal survey to rule out bone lesions.  There is no evidence of anemia, hypercalcemia or other pathological fractures.  Her renal failure is unlikely related to plasma cell disorder.  We discussed today the potential ramification of having MGUS and the risk of progression to multiple myeloma.  It is estimated about 1 %/year at this time.  From a management standpoint, I recommended obtaining a skeletal survey and repeat her laboratory testing in 6 months.  Bone marrow biopsy may be needed to complete the staging depending on the findings.  2.  Renal insufficiency: Related to long-standing hypertension and diabetes and less likely to a plasma cell disorder.  3.  Follow-up: We will be in 6 months.  45 minutes was spent today with the patient face-to-face.  More than 50% of time was spent in education, counseling and coordination of her care.  This includes discussion about the differential diagnosis and management options.

## 2017-01-20 NOTE — Telephone Encounter (Signed)
Gave avs and calendar for july °

## 2017-01-21 ENCOUNTER — Telehealth: Payer: Self-pay | Admitting: *Deleted

## 2017-01-21 NOTE — Telephone Encounter (Signed)
Medical Assistant left message on patient's home and cell voicemail. Voicemail states to give a call back to [place] with [facility] at [phone]. Patient is aware of overall labs being the same as in the hospital. Patient advised to control blood sugar to improve kidney function. Patient aware of blood count being normal and to follow up as planned.

## 2017-01-21 NOTE — Telephone Encounter (Signed)
-----   Message from [place] Donovan, [place] sent at 01/19/2017  2:28 PM EST ----- Please call patient.  Her overall labs were not much different than when she was hospitalized.  Hopefully her kidney function will improve as her blood sugar is under better control. We will continue to follow this. Her blood count has improved.  Follow-up as planned. Thanks, Freeman Caldron, PA-C

## 2017-01-22 ENCOUNTER — Telehealth: Payer: Self-pay | Admitting: *Deleted

## 2017-01-22 NOTE — Telephone Encounter (Signed)
spoke with patient. Per dr Alen Blew, her x-rays were normal

## 2017-01-22 NOTE — Telephone Encounter (Signed)
-----   Message from Wyatt Portela, MD sent at 01/21/2017  8:15 AM EST ----- Please let her know her xray are normal

## 2017-01-26 NOTE — Progress Notes (Signed)
Cardiology Office Note    Date:  01/28/2017   ID:  Rebekah Johnson, DOB 07-29-1960, MRN [id number]  PCP:  Charlott Rakes, MD  Cardiologist: Dr. [name]   Chief Complaint: 2 weeks follow up  History of Present Illness:   Rebekah Johnson is a 57 y.o. female with HTN (patent renal arteries by PV angio 08/2015), uncontrolled DM2, hyperlipidemia, CKD stage III by labs, diabetic neuropathy, PVD (status post left SFA and popliteal stent and left SFA PTA with drug coated balloon in 08/2015), left foot transmetatarsal amputation, tobacco abuse, obesity presents for follow up.   Admitted 09/2016 with acute on chronic diastolic CHF, sec to noncompliance - out of medications for 3 days. Found to have acute hypoxic respiratory failure with O2 sats 70s requiring bipap.  Felt to have new onset CHF, also with troponin of 2. EF 55-60% G2DD. Cath showed 100% prox CX to mid CX stenosis, RCA with 80% stenosis other with nonobstructive as well. PCI would require multiple stents and significant contrast, recommendws to treat medically. UA concerning for nephrotic process. Outpatient referral to nephrology.    Admitted 01/01/17 with another acute on chronic diastolic CHF - and NSTEMI,  Maximum troponin 4.9, because of previous cath findings  and CKD stage III conservative management was decided. She was diuresed..    Seen by Dr. [name] 01/15/17. Noted volume overload. Increased lasix to 2m BID. May consider PCI once evaluated by nephrologist or worsening of symptoms. Imdur increased to 930mqd for elevated BP.   Seen by oncology 01/21/16 for abnormal protein electrophoresis.  He was found to have a 0.5 g/dL on a serum protein electrophoresis with IgG kappa immunofixation. Felt potential ramification of having MGUS and the risk of progression to multiple myeloma. Recommended obtaining a skeletal survey and repeat her laboratory testing in 6 months.  Bone marrow biopsy may be needed to complete the staging depending on  the findings  Here today for follow up.  Patient has noted lower edema.  Sleeps chronically on elevated head.  Dyspnea improving.  Ongoing tobacco smoking.  Denies chest pain, palpitation, dizziness or melena.    Past Medical History:  Diagnosis Date  . CAD (coronary artery disease)    a. NSTEM 09/2016: cath showing severe diffuse disease of the RCA, ramus and Cx with mild-mod disease of LAD; PCI would require multiple stents and significant contrast usage thus medical therapy recommended.  . Chronic diastolic CHF (congestive heart failure) ([facility]  . CKD (chronic kidney disease), stage III ([facility]  . Diabetes mellitus with neuropathy ([facility]  . Foot amputation status ([facility]   a. h/o left foot transmetatarsal amputation  . High cholesterol   . Hypertension    a. patent renal arteries by PV angio 08/2015. b. heavy proteinuria 09/2016 ? nephrotic.  . Marland Kitchenormocytic anemia   . Obesity   . Proteinuria   . PVD (peripheral vascular disease) ([facility]   a. status post left SFA and popliteal stent and left SFA PTA with drug coated balloon in 08/2015.  . Tobacco abuse     Past Surgical History:  Procedure Laterality Date  . ABDOMINAL HYSTERECTOMY    . AMPUTATION Left 09/06/2015   Procedure: LEFT TRANSMETATARSAL AMPUTATTION;  Surgeon: MaNewt MinionMD;  [facility] Service: Orthopedics;  Laterality: Left;  . LEFT HEART CATH AND CORONARY ANGIOGRAPHY N/A 09/17/2016   Procedure: LEFT HEART CATH AND CORONARY ANGIOGRAPHY;  Surgeon: VaJettie BoozeMD;  [facility];  Service: Cardiovascular;  Laterality: N/A;  . PERIPHERAL VASCULAR CATHETERIZATION N/A 08/16/2015   Procedure: Abdominal Aortogram;  Surgeon: Elam Dutch, MD;  Location: [place] [facility];  Service: Cardiovascular;  Laterality: N/A;  . PERIPHERAL VASCULAR CATHETERIZATION Bilateral 08/16/2015   Procedure: Lower Extremity Angiography;  Surgeon: Elam Dutch, MD;  Location: [place] [facility];  Service:  Cardiovascular;  Laterality: Bilateral;  . PERIPHERAL VASCULAR CATHETERIZATION Left 08/16/2015   Procedure: Peripheral Vascular Intervention;  Surgeon: Elam Dutch, MD;  Location: [place];  Service: Cardiovascular;  Laterality: Left;  SFA STENT X 2    Current Medications: Prior to Admission medications   Medication Sig Start Date End Date Taking? Authorizing Provider  amLODipine (NORVASC) 10 MG tablet Take 1 tablet (10 mg total) by mouth daily. Reported on 07/13/2015 12/31/16 12/31/17  Arnoldo Morale, MD  aspirin 81 MG EC tablet Take 1 tablet (81 mg total) by mouth daily. Reported on 07/13/2015 07/02/16   Arnoldo Morale, MD  atorvastatin (LIPITOR) 80 MG tablet Take 1 tablet (80 mg total) by mouth daily. 12/31/16   Arnoldo Morale, MD  blood glucose meter kit and supplies KIT Dispense based on patient and insurance preference. Use up to four times daily as directed. (FOR ICD-9 250.00, 250.01). 07/25/15   Ward, Delice Bison, DO  Blood Glucose Monitoring Suppl (ACCU-CHEK AVIVA) device Use as instructed three times daily. 04/22/16   Arnoldo Morale, MD  carvedilol (COREG) 25 MG tablet Take 1 tablet (25 mg total) by mouth 2 (two) times daily. 12/31/16 12/31/17  Arnoldo Morale, MD  Cholecalciferol (VITAMIN D-3) 1000 units CAPS Take 1,000 Units by mouth daily.     [provider]  clopidogrel (PLAVIX) 75 MG tablet Take 1 tablet (75 mg total) by mouth daily. 12/31/16   Arnoldo Morale, MD  docusate sodium (COLACE) 100 MG capsule TAKE ONE CAPSULE BY MOUTH TWICE A DAY 10/26/16   Arnoldo Morale, MD  furosemide (LASIX) 40 MG tablet Take 40 mg by mouth twice daily for 7 days only, then go back to taking 40 mg by mouth daily thereafter. 01/18/17   Dorothy Spark, MD  gabapentin (NEURONTIN) 300 MG capsule Take 1 capsule (300 mg total) by mouth 3 (three) times daily. 12/31/16   Arnoldo Morale, MD  glipiZIDE (GLUCOTROL) 10 MG tablet Take 1 tablet (10 mg total) by mouth 2 (two) times daily before a meal.  12/31/16   Arnoldo Morale, MD  glucose blood (ACCU-CHEK AVIVA) test strip Use as instructed three times daily before meals. 04/22/16   Arnoldo Morale, MD  hydrALAZINE (APRESOLINE) 100 MG tablet Take 1 tablet (100 mg total) by mouth 2 (two) times daily. 12/31/16   Arnoldo Morale, MD  isosorbide mononitrate (IMDUR) 30 MG 24 hr tablet Take 3 tablets (90 mg total) by mouth daily. 01/18/17   Dorothy Spark, MD  Lancet Devices [facility]) lancets Use as instructed three times daily before meals. 04/22/16   Arnoldo Morale, MD  nitroGLYCERIN (NITROSTAT) 0.4 MG SL tablet Place 1 tablet (0.4 mg total) under the tongue every 5 (five) minutes as needed for chest pain. 01/18/17 04/18/17  Dorothy Spark, MD  Omega-3 Fatty Acids (FISH OIL) 1000 MG CAPS Take 1,000 mg by mouth daily. Reported on 07/13/2015    [provider]  sitaGLIPtin (JANUVIA) 50 MG tablet Take 1 tablet (50 mg total) by mouth daily. 12/31/16   Arnoldo Morale, MD    Allergies:   Patient has no known allergies.   Social  History   Socioeconomic History  . Marital status: Married    Spouse name: None  . Number of children: None  . Years of education: None  . Highest education level: None  Social Needs  . Financial resource strain: None  . Food insecurity - worry: None  . Food insecurity - inability: None  . Transportation needs - medical: None  . Transportation needs - non-medical: None  Occupational History  . None  Tobacco Use  . Smoking status: Former Smoker    Packs/day: 0.00    Types: Cigarettes    Last attempt to quit: 01/13/2017    Years since quitting: 0.0  . Smokeless tobacco: Never Used  . Tobacco comment: Down to 3 cigarettes per day  Substance and Sexual Activity  . Alcohol use: Yes    Alcohol/week: 0.6 - 1.2 oz    Types: 1 - 2 Glasses of wine per week    Comment: on social occassions  . Drug use: No  . Sexual activity: Yes    Partners: Male  Other Topics Concern  . None  Social History Narrative    . None     Family History:  The patient's family history includes Diabetes in her mother; Hypertension in her father.   ROS:   Please see the history of present illness.    ROS All other systems reviewed and are negative.   PHYSICAL EXAM:   VS:  BP (!) 110/56   Pulse 62   Ht '5\' 4"'  (1.626 m)   Wt 196 lb 9.6 oz (89.2 kg)   SpO2 96%   BMI 33.75 kg/m    GEN: Morbidly obese female in no acute distress  HEENT: normal  Neck: +  JVD. NO carotid bruits, or masses Cardiac: RRR; no murmurs, rubs, or gallops,no edema  Respiratory: Faint bibasilar rales GI: soft, nontender, nondistended, + BS MS: no deformity or atrophy  Skin: warm and dry, no rash Neuro:  Alert and Oriented x 3, Strength and sensation are intact Psych: euthymic mood, full affect  Wt Readings from Last 3 Encounters:  01/28/17 196 lb 9.6 oz (89.2 kg)  01/20/17 198 lb 14.4 oz (90.2 kg)  01/15/17 201 lb (91.2 kg)      Studies/Labs Reviewed:   EKG:  EKG is not  ordered today.    Recent Labs: 09/14/2016: TSH 0.959 01/01/2017: ALT 22; B Natriuretic Peptide 962.9; Magnesium 1.7 01/14/2017: BUN 19; Creatinine, Ser 2.21; Hemoglobin 11.6; Platelets 308; Potassium 4.3; Sodium 143   Lipid Panel    Component Value Date/Time   CHOL 93 09/16/2016 0559   TRIG 163 (H) 09/16/2016 0559   HDL 25 (L) 09/16/2016 0559   CHOLHDL 3.7 09/16/2016 0559   VLDL 33 09/16/2016 0559   LDLCALC 35 09/16/2016 0559    Additional studies/ records that were reviewed today include:   LEFT HEART CATH AND CORONARY ANGIOGRAPHY  Conclusion     Ost 1st Diag lesion, 25 %stenosed.  Ost 2nd Diag lesion, 25 %stenosed.  Ramus-1 lesion, 50 %stenosed.  Ramus-2 lesion, 80 %stenosed.  Prox Cx to Mid Cx lesion, 100 %stenosed.  Prox RCA to Mid RCA lesion, 80 %stenosed.  RPDA lesion, 80 %stenosed.  Dist RCA lesion, 80 %stenosed.  Mid RCA to Dist RCA lesion, 80 %stenosed.  LV end diastolic pressure is mildly elevated.  There is no  aortic valve stenosis.  Severe diffuse disease of the RCA, Ramus and circumflex. LAD with only mild to moderate diffuse disease. PCI would require multiple stents  and significant contrast usage. For now, would treat medically.     Echo 09/15/16 Study Conclusions  - Left ventricle: The cavity size was normal. Wall thickness was increased in a pattern of mild LVH. Systolic function was normal. The estimated ejection fraction was in the range of 55% to 60%. There is akinesis of the basal-midinferolateral myocardium. Features are consistent with a pseudonormal left ventricular filling pattern, with concomitant abnormal relaxation and increased filling pressure (grade 2 diastolic dysfunction). Doppler parameters are consistent with high ventricular filling pressure. - Mitral valve: There was mild regurgitation. - Left atrium: The atrium was mildly dilated.  Impressions:  - Akinesis of the basal/mid inferolateral wall with overall preserved LV systolic function; moderate diastolic dysfunction; mild LVH; mild MR; mild LAE.   ASSESSMENT & PLAN:    1. Acute on chronic diastolic CHF -Patient has lost 5lb since last office visit.  She does not check her weights regularly at home.  Encourage daily weight.  Heart failure education given in detail.  She does have evidence of mild volume overload by exam.  She will take extra Lasix 40 mg tonight.  Pending BUN and BNP today.  Further adjustment based on lab.  2. CAD  - Cath as above. Medically managed. May consider PCI once evaluated by nephrologist or worsening of symptoms.  -Continue with aspirin, Plavix, statin, Imdur and beta-blocker.  3. Hypertensive heart disease -Stable and well controlled on current regimen.  4. PVD - on Plavix  5. CKD stage III - pending evaluation by nephrologist.   6. Tobacco smoking - She has cut back. Advised complete cessation.    Medication Adjustments/Labs and Tests  Ordered: Current medicines are reviewed at length with the patient today.  Concerns regarding medicines are outlined above.  Medication changes, Labs and Tests ordered today are listed in the Patient Instructions below. Patient Instructions  Medication Instructions:   TAKE LASIX 40 MG TWICE DAILY FOR TODAY ONLY, THEN GO BACK TO REGULAR REGIMEN OF TAKING LASIX 40 MG BY MOUTH DAILY THEREAFTER, UNTIL OTHERWISE ADVISED.    Labwork:  AS PLANNED AND PREVIOUSLY SCHEDULED FOR TODAY     Follow-Up:  3-4 MONTHS WITH DR NELSON    *PLEASE CALL THE OFFICE IF YOUR WEIGHT INCREASES 3 LBS IN A 24 HOUR TIME PERIOD OR 5 LBS IN A WEEK*    If you need a refill on your cardiac medications before your next appointment, please call your pharmacy.      Jarrett Soho, [place]  01/28/2017 11:00 AM    [address], [place], [place]  [address] Phone: [phone]; Fax: [phone]

## 2017-01-28 ENCOUNTER — Other Ambulatory Visit: Payer: Medicaid Other

## 2017-01-28 ENCOUNTER — Encounter: Payer: Self-pay | Admitting: Physician Assistant

## 2017-01-28 ENCOUNTER — Ambulatory Visit (INDEPENDENT_AMBULATORY_CARE_PROVIDER_SITE_OTHER): Payer: Medicaid Other | Admitting: Physician Assistant

## 2017-01-28 VITALS — BP 110/56 | HR 62 | Ht 64.0 in | Wt 196.6 lb

## 2017-01-28 DIAGNOSIS — I5033 Acute on chronic diastolic (congestive) heart failure: Secondary | ICD-10-CM

## 2017-01-28 DIAGNOSIS — I130 Hypertensive heart and chronic kidney disease with heart failure and stage 1 through stage 4 chronic kidney disease, or unspecified chronic kidney disease: Secondary | ICD-10-CM

## 2017-01-28 DIAGNOSIS — N183 Chronic kidney disease, stage 3 unspecified: Secondary | ICD-10-CM

## 2017-01-28 DIAGNOSIS — I739 Peripheral vascular disease, unspecified: Secondary | ICD-10-CM | POA: Diagnosis not present

## 2017-01-28 DIAGNOSIS — Z720 Tobacco use: Secondary | ICD-10-CM

## 2017-01-28 DIAGNOSIS — I2510 Atherosclerotic heart disease of native coronary artery without angina pectoris: Secondary | ICD-10-CM | POA: Diagnosis not present

## 2017-01-28 NOTE — Patient Instructions (Signed)
Medication Instructions:   TAKE LASIX 40 MG TWICE DAILY FOR TODAY ONLY, THEN GO BACK TO REGULAR REGIMEN OF TAKING LASIX 40 MG BY MOUTH DAILY THEREAFTER, UNTIL OTHERWISE ADVISED.    Labwork:  AS PLANNED AND PREVIOUSLY SCHEDULED FOR TODAY     Follow-Up:  3-4 MONTHS WITH DR NELSON    *PLEASE CALL THE OFFICE IF YOUR WEIGHT INCREASES 3 LBS IN A 24 HOUR TIME PERIOD OR 5 LBS IN A WEEK*    If you need a refill on your cardiac medications before your next appointment, please call your pharmacy.

## 2017-01-29 ENCOUNTER — Telehealth: Payer: Self-pay | Admitting: Cardiology

## 2017-01-29 ENCOUNTER — Telehealth: Payer: Self-pay | Admitting: *Deleted

## 2017-01-29 DIAGNOSIS — I5033 Acute on chronic diastolic (congestive) heart failure: Secondary | ICD-10-CM

## 2017-01-29 DIAGNOSIS — I2510 Atherosclerotic heart disease of native coronary artery without angina pectoris: Secondary | ICD-10-CM

## 2017-01-29 DIAGNOSIS — I130 Hypertensive heart and chronic kidney disease with heart failure and stage 1 through stage 4 chronic kidney disease, or unspecified chronic kidney disease: Secondary | ICD-10-CM

## 2017-01-29 LAB — BASIC METABOLIC PANEL
BUN/Creatinine Ratio: 10 (ref 9–23)
BUN: 26 mg/dL — ABNORMAL HIGH (ref 6–24)
CO2: 18 mmol/L — ABNORMAL LOW (ref 20–29)
Calcium: 9.1 mg/dL (ref 8.7–10.2)
Chloride: 105 mmol/L (ref 96–106)
Creatinine, Ser: 2.68 mg/dL — ABNORMAL HIGH (ref 0.57–1.00)
GFR calc Af Amer: 22 mL/min/{1.73_m2} — ABNORMAL LOW (ref >59)
GFR calc non Af Amer: 19 mL/min/{1.73_m2} — ABNORMAL LOW (ref >59)
Glucose: 103 mg/dL — ABNORMAL HIGH (ref 65–99)
Potassium: 4.4 mmol/L (ref 3.5–5.2)
Sodium: 141 mmol/L (ref 134–144)

## 2017-01-29 LAB — PRO B NATRIURETIC PEPTIDE: NT-Pro BNP: 1267 pg/mL — ABNORMAL HIGH (ref 0–287)

## 2017-01-29 MED ORDER — FUROSEMIDE 40 MG PO TABS: ORAL_TABLET | ORAL | 0 refills | Status: DC

## 2017-01-29 NOTE — Telephone Encounter (Signed)
-----   Message from [place], [place] sent at 01/29/2017 10:01 AM EST ----- Elevated fluid marker and SCr. Will continue Lasix 40mg  BID x 3 days and then back to daily dose. He needs to seen by nephrologist soon due to CKD.

## 2017-01-29 NOTE — Telephone Encounter (Signed)
New message  Patient wanted to inform Rebekah Johnson that her next kidney appt is on 02/09/2017

## 2017-01-29 NOTE — Telephone Encounter (Signed)
Notified the pt that per [facility], he reviewed her labs and noted she has elevated fluid marker and SCr.  Informed the pt that per Vin, he recommends that she increase lasix to 40 mg po BID x 3 days only, then go back to taking lasix 40 mg po daily thereafter.  Also advised the pt that per Vin, he would like for her to call her Nephrologist at [place], and request for a sooner appt, for follow-up of her CKD.  Pt verbalized understanding and agrees with this plan.  Pt states she will call her Nephrologist office today to request for a sooner appt.  Phoned-in the pts lasix change to the Pharmacist at [place].  Will update Dr [name] of dosage change.

## 2017-02-01 ENCOUNTER — Encounter: Payer: Self-pay | Admitting: Nephrology

## 2017-02-08 ENCOUNTER — Other Ambulatory Visit: Payer: Self-pay | Admitting: Pharmacist

## 2017-02-08 ENCOUNTER — Encounter: Payer: Self-pay | Admitting: Pharmacist

## 2017-02-08 DIAGNOSIS — I5033 Acute on chronic diastolic (congestive) heart failure: Secondary | ICD-10-CM

## 2017-02-08 DIAGNOSIS — I2510 Atherosclerotic heart disease of native coronary artery without angina pectoris: Secondary | ICD-10-CM

## 2017-02-08 DIAGNOSIS — I130 Hypertensive heart and chronic kidney disease with heart failure and stage 1 through stage 4 chronic kidney disease, or unspecified chronic kidney disease: Secondary | ICD-10-CM

## 2017-02-08 MED ORDER — ISOSORBIDE MONONITRATE ER 30 MG PO TB24: 90.0000 mg | ORAL_TABLET | Freq: Every day | ORAL | 0 refills | Status: DC

## 2017-02-08 NOTE — Progress Notes (Signed)
PA submitted to [employer] for Januvia. Pending approval. Confirmation #: [id number]

## 2017-02-15 ENCOUNTER — Other Ambulatory Visit: Payer: Self-pay | Admitting: Family Medicine

## 2017-02-15 DIAGNOSIS — E1149 Type 2 diabetes mellitus with other diabetic neurological complication: Secondary | ICD-10-CM

## 2017-02-15 MED ORDER — ATORVASTATIN CALCIUM 80 MG PO TABS: 80.0000 mg | ORAL_TABLET | Freq: Every day | ORAL | 3 refills | Status: DC

## 2017-02-15 MED ORDER — SITAGLIPTIN PHOSPHATE 50 MG PO TABS: 50.0000 mg | ORAL_TABLET | Freq: Every day | ORAL | 3 refills | Status: DC

## 2017-02-15 NOTE — Telephone Encounter (Signed)
Refills sent, and Januvia was approved last week

## 2017-02-15 NOTE — Addendum Note (Signed)
Addended by: Rica Mast on: 02/15/2017 10:47 AM   Modules accepted: Orders

## 2017-02-15 NOTE — Telephone Encounter (Signed)
Pt called to request a refill on -atorvastatin (LIPITOR) 80 MG tablet  -sitaGLIPtin (JANUVIA) 50 MG tablet  If approved please send to  -[id number] [place], [place] [address]  [facility] but never [facility]. Please follow up

## 2017-02-16 ENCOUNTER — Encounter: Payer: Self-pay | Admitting: Gastroenterology

## 2017-02-16 ENCOUNTER — Ambulatory Visit (INDEPENDENT_AMBULATORY_CARE_PROVIDER_SITE_OTHER): Payer: Medicaid Other | Admitting: Gastroenterology

## 2017-02-16 VITALS — BP 152/78 | HR 72 | Ht 64.0 in | Wt 195.2 lb

## 2017-02-16 DIAGNOSIS — Z1211 Encounter for screening for malignant neoplasm of colon: Secondary | ICD-10-CM | POA: Diagnosis not present

## 2017-02-16 NOTE — Patient Instructions (Addendum)
We will get records sent from your previous gastroenterologist Dr. Don Broach from [facility] for review.  This will include any endoscopic (colonoscopy or upper endoscopy) procedures and any associated pathology reports.    Pending review of the above, will determine next best step (repeat colonoscopy or perhaps stool based colon cancer screening test).

## 2017-02-16 NOTE — Progress Notes (Signed)
HPI: This is a very pleasant 57 year old woman who was referred to me by Rebekah Rakes, MD  to evaluate colon cancer screening.    Chief complaint is colon cancer screening  Thinks she had a colonoscopy about 10 years ago Dr. Ferdinand Johnson in [facility].  She thinks he found a polyp but she is not sure what it was or what was recommended to be done for it or in the future about it.  No colon cancer in her family.  She has no concerns with her bowels except mild chronic constipation  Never sees blood in her stool.  She takes plavix for about a year for severe PAD, also has CAD (pretty severe per my review but it is to be medically treated only).  Has been in and out of ER, hospital several times for severe CHF exacerbations.  CBC last month shows normal hemoglobin.  Review of systems: Pertinent positive and negative review of systems were noted in the above HPI section. All other review negative.   Past Medical History:  Diagnosis Date  . CAD (coronary artery disease)    a. NSTEM 09/2016: cath showing severe diffuse disease of the RCA, ramus and Cx with mild-mod disease of LAD; PCI would require multiple stents and significant contrast usage thus medical therapy recommended.  . Chronic diastolic CHF (congestive heart failure) ([place])   . CKD (chronic kidney disease), stage III ([place])   . Diabetes mellitus with neuropathy ([place])   . Foot amputation status ([place])    a. h/o left foot transmetatarsal amputation  . High cholesterol   . Hypertension    a. patent renal arteries by PV angio 08/2015. b. heavy proteinuria 09/2016 ? nephrotic.  [name] Normocytic anemia   . Obesity   . Proteinuria   . PVD (peripheral vascular disease) ([place])    a. status post left SFA and popliteal stent and left SFA PTA with drug coated balloon in 08/2015.  . Tobacco abuse     Past Surgical History:  Procedure Laterality Date  . ABDOMINAL HYSTERECTOMY    . AMPUTATION Left 09/06/2015   Procedure: LEFT TRANSMETATARSAL  AMPUTATTION;  Surgeon: Rebekah Minion, MD;  Location: [facility];  Service: Orthopedics;  Laterality: Left;  . LEFT HEART CATH AND CORONARY ANGIOGRAPHY N/A 09/17/2016   Procedure: LEFT HEART CATH AND CORONARY ANGIOGRAPHY;  Surgeon: Rebekah Booze, MD;  Location: [place];  Service: Cardiovascular;  Laterality: N/A;  . PERIPHERAL VASCULAR CATHETERIZATION N/A 08/16/2015   Procedure: Abdominal Aortogram;  Surgeon: Rebekah Dutch, MD;  Location: [place] [facility];  Service: Cardiovascular;  Laterality: N/A;  . PERIPHERAL VASCULAR CATHETERIZATION Bilateral 08/16/2015   Procedure: Lower Extremity Angiography;  Surgeon: Rebekah Dutch, MD;  Location: [place] [facility];  Service: Cardiovascular;  Laterality: Bilateral;  . PERIPHERAL VASCULAR CATHETERIZATION Left 08/16/2015   Procedure: Peripheral Vascular Intervention;  Surgeon: Rebekah Dutch, MD;  Location: [place] [facility];  Service: Cardiovascular;  Laterality: Left;  SFA STENT X 2    Current Outpatient Medications  Medication Sig Dispense Refill  . amLODipine (NORVASC) 10 MG tablet Take 1 tablet (10 mg total) by mouth daily. Reported on 07/13/2015 30 tablet 3  . aspirin 81 MG EC tablet Take 1 tablet (81 mg total) by mouth daily. Reported on 07/13/2015 90 tablet 0  . atorvastatin (LIPITOR) 80 MG tablet Take 1 tablet (80 mg total) by mouth daily. 30 tablet 3  . blood glucose meter kit and supplies KIT Dispense based  on patient and insurance preference. Use up to four times daily as directed. (FOR ICD-9 250.00, 250.01). 1 each 3  . Blood Glucose Monitoring Suppl (ACCU-CHEK AVIVA) device Use as instructed three times daily. 1 each 0  . carvedilol (COREG) 25 MG tablet Take 1 tablet (25 mg total) by mouth 2 (two) times daily. 60 tablet 3  . Cholecalciferol (VITAMIN D-3) 1000 units CAPS Take 1,000 Units by mouth daily.     . clopidogrel (PLAVIX) 75 MG tablet Take 1 tablet (75 mg total) by mouth daily. 30 tablet 3  . docusate sodium (COLACE)  100 MG capsule TAKE ONE CAPSULE BY MOUTH TWICE A DAY 60 capsule 0  . furosemide (LASIX) 40 MG tablet Take 40 mg by mouth twice daily x 3 days only, then go back to taking 40 mg by mouth daily thereafter. (Patient taking differently: 20 mg 2 (two) times daily. Take 40 mg by mouth twice daily x 3 days only, then go back to taking 40 mg by mouth daily thereafter.) 37 tablet 0  . gabapentin (NEURONTIN) 300 MG capsule Take 1 capsule (300 mg total) by mouth 3 (three) times daily. 90 capsule 3  . glipiZIDE (GLUCOTROL) 10 MG tablet Take 1 tablet (10 mg total) by mouth 2 (two) times daily before a meal. 60 tablet 3  . glucose blood (ACCU-CHEK AVIVA) test strip Use as instructed three times daily before meals. 100 each 12  . hydrALAZINE (APRESOLINE) 100 MG tablet Take 1 tablet (100 mg total) by mouth 2 (two) times daily. 60 tablet 3  . isosorbide mononitrate (IMDUR) 30 MG 24 hr tablet Take 3 tablets (90 mg total) by mouth daily. 270 tablet 0  . Lancet Devices (ACCU-CHEK SOFTCLIX) lancets Use as instructed three times daily before meals. 1 each 5  . nitroGLYCERIN (NITROSTAT) 0.4 MG SL tablet Place 1 tablet (0.4 mg total) under the tongue every 5 (five) minutes as needed for chest pain. 25 tablet 3  . Omega-3 Fatty Acids (FISH OIL) 1000 MG CAPS Take 1,000 mg by mouth daily. Reported on 07/13/2015    . sitaGLIPtin (JANUVIA) 50 MG tablet Take 1 tablet (50 mg total) by mouth daily. 30 tablet 3   No current facility-administered medications for this visit.     Allergies as of 02/16/2017  . (No Known Allergies)    Family History  Problem Relation Age of Onset  . Diabetes Mother   . Hypertension Father     Social History   Socioeconomic History  . Marital status: Married    Spouse name: Not on file  . Number of children: Not on file  . Years of education: Not on file  . Highest education level: Not on file  Social Needs  . Financial resource strain: Not on file  . Food insecurity - worry: Not on file   . Food insecurity - inability: Not on file  . Transportation needs - medical: Not on file  . Transportation needs - non-medical: Not on file  Occupational History  . Not on file  Tobacco Use  . Smoking status: Former Smoker    Packs/day: 0.00    Types: Cigarettes    Last attempt to quit: 01/13/2017    Years since quitting: 0.0  . Smokeless tobacco: Never Used  . Tobacco comment: Down to 3 cigarettes per day  Substance and Sexual Activity  . Alcohol use: Yes    Alcohol/week: 0.6 - 1.2 oz    Types: 1 - 2 Glasses of wine per  week    Comment: on social occassions  . Drug use: No  . Sexual activity: Yes    Partners: Male  Other Topics Concern  . Not on file  Social History Narrative  . Not on file     Physical Exam: BP (!) 152/78   Pulse 72   Ht '5\' 4"'  (1.626 m)   Wt 195 lb 4 oz (88.6 kg)   BMI 33.51 kg/m  Constitutional: generally well-appearing Psychiatric: alert and oriented x3 Eyes: extraocular movements intact Mouth: oral pharynx moist, no lesions Neck: supple no lymphadenopathy Cardiovascular: heart regular rate and rhythm Lungs: clear to auscultation bilaterally Abdomen: soft, nontender, nondistended, no obvious ascites, no peritoneal signs, normal bowel sounds Extremities: no lower extremity edema bilaterally Skin: no lesions on visible extremities   Assessment and plan: 57 y.o. female with CHF, blood thinner daily use, colon cancer screening  First I think it is most important if we determine if she needs colon cancer screening of any type at this point.  She does not have a family history of colon cancer.  She has no overt GI symptoms which would require testing outside of colon cancer screening.  We will have her sign a release of information to get her previous records from her most recent colonoscopy and [place] sent here for review with any associated pathology reports.  Pending review of that I will determine if she needs colon cancer screening and if so  how it should best be done for her either colonoscopy or stool base test.    Please see the "Patient Instructions" section for addition details about the plan.   Owens Loffler, MD [facility] 02/16/2017, 10:06 AM  Cc: Rebekah Rakes, MD

## 2017-02-17 ENCOUNTER — Other Ambulatory Visit (HOSPITAL_COMMUNITY): Payer: Self-pay | Admitting: Nephrology

## 2017-02-17 DIAGNOSIS — N183 Chronic kidney disease, stage 3 unspecified: Secondary | ICD-10-CM

## 2017-02-17 DIAGNOSIS — Z720 Tobacco use: Secondary | ICD-10-CM

## 2017-02-17 DIAGNOSIS — R809 Proteinuria, unspecified: Secondary | ICD-10-CM

## 2017-02-17 DIAGNOSIS — I739 Peripheral vascular disease, unspecified: Secondary | ICD-10-CM

## 2017-03-01 ENCOUNTER — Other Ambulatory Visit: Payer: Self-pay | Admitting: Radiology

## 2017-03-02 ENCOUNTER — Encounter (HOSPITAL_COMMUNITY): Payer: Self-pay

## 2017-03-02 ENCOUNTER — Ambulatory Visit (HOSPITAL_COMMUNITY)
Admission: RE | Admit: 2017-03-02 | Discharge: 2017-03-02 | Disposition: A | Discharge status: Home / Self Care | Payer: Medicaid Other | Source: Ambulatory Visit | Attending: Nephrology | Admitting: Nephrology

## 2017-03-02 DIAGNOSIS — E1140 Type 2 diabetes mellitus with diabetic neuropathy, unspecified: Secondary | ICD-10-CM | POA: Diagnosis not present

## 2017-03-02 DIAGNOSIS — N183 Chronic kidney disease, stage 3 unspecified: Secondary | ICD-10-CM

## 2017-03-02 DIAGNOSIS — I2510 Atherosclerotic heart disease of native coronary artery without angina pectoris: Secondary | ICD-10-CM | POA: Diagnosis not present

## 2017-03-02 DIAGNOSIS — Z7984 Long term (current) use of oral hypoglycemic drugs: Secondary | ICD-10-CM | POA: Diagnosis not present

## 2017-03-02 DIAGNOSIS — Z89432 Acquired absence of left foot: Secondary | ICD-10-CM | POA: Diagnosis not present

## 2017-03-02 DIAGNOSIS — R809 Proteinuria, unspecified: Secondary | ICD-10-CM

## 2017-03-02 DIAGNOSIS — I5032 Chronic diastolic (congestive) heart failure: Secondary | ICD-10-CM | POA: Insufficient documentation

## 2017-03-02 DIAGNOSIS — R319 Hematuria, unspecified: Secondary | ICD-10-CM | POA: Insufficient documentation

## 2017-03-02 DIAGNOSIS — E1151 Type 2 diabetes mellitus with diabetic peripheral angiopathy without gangrene: Secondary | ICD-10-CM | POA: Insufficient documentation

## 2017-03-02 DIAGNOSIS — E7800 Pure hypercholesterolemia, unspecified: Secondary | ICD-10-CM | POA: Diagnosis not present

## 2017-03-02 DIAGNOSIS — D649 Anemia, unspecified: Secondary | ICD-10-CM | POA: Insufficient documentation

## 2017-03-02 DIAGNOSIS — Z87891 Personal history of nicotine dependence: Secondary | ICD-10-CM | POA: Insufficient documentation

## 2017-03-02 DIAGNOSIS — E669 Obesity, unspecified: Secondary | ICD-10-CM | POA: Insufficient documentation

## 2017-03-02 DIAGNOSIS — Z79899 Other long term (current) drug therapy: Secondary | ICD-10-CM | POA: Diagnosis not present

## 2017-03-02 DIAGNOSIS — Z7902 Long term (current) use of antithrombotics/antiplatelets: Secondary | ICD-10-CM | POA: Diagnosis not present

## 2017-03-02 DIAGNOSIS — I130 Hypertensive heart and chronic kidney disease with heart failure and stage 1 through stage 4 chronic kidney disease, or unspecified chronic kidney disease: Secondary | ICD-10-CM | POA: Diagnosis not present

## 2017-03-02 DIAGNOSIS — I739 Peripheral vascular disease, unspecified: Secondary | ICD-10-CM

## 2017-03-02 DIAGNOSIS — E1122 Type 2 diabetes mellitus with diabetic chronic kidney disease: Secondary | ICD-10-CM | POA: Insufficient documentation

## 2017-03-02 DIAGNOSIS — Z7982 Long term (current) use of aspirin: Secondary | ICD-10-CM | POA: Diagnosis not present

## 2017-03-02 DIAGNOSIS — Z720 Tobacco use: Secondary | ICD-10-CM

## 2017-03-02 LAB — CBC
HCT: 38.9 % (ref 36.0–46.0)
HEMOGLOBIN: 12.6 g/dL (ref 12.0–15.0)
MCH: 26.9 pg (ref 26.0–34.0)
MCHC: 32.4 g/dL (ref 30.0–36.0)
MCV: 83.1 fL (ref 78.0–100.0)
Platelets: 288 10*3/uL (ref 150–400)
RBC: 4.68 MIL/uL (ref 3.87–5.11)
RDW: 14.7 % (ref 11.5–15.5)
WBC: 12.9 10*3/uL — ABNORMAL HIGH (ref 4.0–10.5)

## 2017-03-02 LAB — PROTIME-INR
INR: 0.98
Prothrombin Time: 12.9 seconds (ref 11.4–15.2)

## 2017-03-02 LAB — GLUCOSE, CAPILLARY: Glucose-Capillary: 95 mg/dL (ref 65–99)

## 2017-03-02 MED ORDER — MIDAZOLAM HCL 2 MG/2ML IJ SOLN
INTRAMUSCULAR | Status: AC
  Filled 2017-03-02: qty 4

## 2017-03-02 MED ORDER — FENTANYL CITRATE (PF) 100 MCG/2ML IJ SOLN
INTRAMUSCULAR | Status: AC | PRN
  Administered 2017-03-02: 50 ug via INTRAVENOUS

## 2017-03-02 MED ORDER — FENTANYL CITRATE (PF) 100 MCG/2ML IJ SOLN
INTRAMUSCULAR | Status: AC
  Filled 2017-03-02: qty 4

## 2017-03-02 MED ORDER — LIDOCAINE HCL (PF) 1 % IJ SOLN
INTRAMUSCULAR | Status: AC
  Filled 2017-03-02: qty 30

## 2017-03-02 MED ORDER — HYDROCODONE-ACETAMINOPHEN 5-325 MG PO TABS: 1.0000 | ORAL_TABLET | ORAL | Status: DC | PRN

## 2017-03-02 MED ORDER — MIDAZOLAM HCL 2 MG/2ML IJ SOLN
INTRAMUSCULAR | Status: AC | PRN
  Administered 2017-03-02: 1 mg via INTRAVENOUS

## 2017-03-02 MED ORDER — SODIUM CHLORIDE 0.9 % IV SOLN: INTRAVENOUS | Status: DC

## 2017-03-02 NOTE — Discharge Instructions (Signed)
Percutaneous Kidney Biopsy, Care After °This sheet gives you information about how to care for yourself after your procedure. Your health care provider may also give you more specific instructions. If you have problems or questions, contact your health care provider. °What can I expect after the procedure? °After the procedure, it is common to have: °Pain or soreness near the area where the needle went through your skin (biopsy site). °Bright pink or cloudy urine for 24 hours after the procedure. ° °Follow these instructions at home: °Activity °Return to your normal activities as told by your health care provider. Ask your health care provider what activities are safe for you. °Do not drive for 24 hours if you were given a medicine to help you relax (sedative). °Do not lift anything that is heavier than 10 lb (4.5 kg) until your health care provider tells you that it is safe. °Avoid activities that take a lot of effort (are strenuous) until your health care provider approves. Most people will have to wait 2 weeks before returning to activities such as exercise or sexual intercourse. °General instructions °Take over-the-counter and prescription medicines only as told by your health care provider. °You may eat and drink after your procedure. Follow instructions from your health care provider about eating or drinking restrictions. °Check your biopsy site every day for signs of infection. Check for: °More redness, swelling, or pain. °More fluid or blood. °Warmth. °Pus or a bad smell. °Keep all follow-up visits as told by your health care provider. This is important. °Contact a health care provider if: °You have more redness, swelling, or pain around your biopsy site. °You have more fluid or blood coming from your biopsy site. °Your biopsy site feels warm to the touch. °You have pus or a bad smell coming from your biopsy site. °You have blood in your urine more than 24 hours after your procedure. °Get help right away  if: °You have dark red or brown urine. °You have a fever. °You are unable to urinate. °You feel burning when you urinate. °You feel faint. °You have severe pain in your abdomen or side. °This information is not intended to replace advice given to you by your health care provider. Make sure you discuss any questions you have with your health care provider. °Document Released: 08/24/2012 Document Revised: 10/04/2015 Document Reviewed: 10/04/2015 °Elsevier Interactive Patient Education © 2018 [employer] Inc[employer] ° °

## 2017-03-02 NOTE — Procedures (Signed)
Interventional Radiology Procedure Note  Procedure: US guided left kidney biopsy  Complications: None  Estimated Blood Loss: < 10 mL  Findings: Left kidney sampled at level of IP/LP cortex with 16 G core device x 2.   Venetia Night. Kathlene Cote, M.D Pager:  [phone]

## 2017-03-02 NOTE — H&P (Addendum)
Chief Complaint: Patient was seen in consultation today for random renal biopsy at the request of [facility]  Referring Physician(s): Corliss Parish  Supervising Physician: Aletta Edouard  Patient Status: [facility] - Out-pt  History of Present Illness: Rebekah Johnson is a 57 y.o. female   Proteinuria Hematuria Rising creatinine CKD 3 HTN; CAD; PVD  Scheduled now for random renal biopsy LD Plavix 8 days ago  Past Medical History:  Diagnosis Date  . CAD (coronary artery disease)    a. NSTEM 09/2016: cath showing severe diffuse disease of the RCA, ramus and Cx with mild-mod disease of LAD; PCI would require multiple stents and significant contrast usage thus medical therapy recommended.  . Chronic diastolic CHF (congestive heart failure) ([place])   . CKD (chronic kidney disease), stage III ([place])   . Diabetes mellitus with neuropathy ([place])   . Foot amputation status ([place])    a. h/o left foot transmetatarsal amputation  . High cholesterol   . Hypertension    a. patent renal arteries by PV angio 08/2015. b. heavy proteinuria 09/2016 ? nephrotic.  [name] Normocytic anemia   . Obesity   . Proteinuria   . PVD (peripheral vascular disease) ([place])    a. status post left SFA and popliteal stent and left SFA PTA with drug coated balloon in 08/2015.  . Tobacco abuse     Past Surgical History:  Procedure Laterality Date  . ABDOMINAL HYSTERECTOMY    . AMPUTATION Left 09/06/2015   Procedure: LEFT TRANSMETATARSAL AMPUTATTION;  Surgeon: Newt Minion, MD;  Location: [place];  Service: Orthopedics;  Laterality: Left;  . LEFT HEART CATH AND CORONARY ANGIOGRAPHY N/A 09/17/2016   Procedure: LEFT HEART CATH AND CORONARY ANGIOGRAPHY;  Surgeon: Jettie Booze, MD;  Location: [place] [facility];  Service: Cardiovascular;  Laterality: N/A;  . PERIPHERAL VASCULAR CATHETERIZATION N/A 08/16/2015   Procedure: Abdominal Aortogram;  Surgeon: Elam Dutch, MD;  Location: [place] [facility];   Service: Cardiovascular;  Laterality: N/A;  . PERIPHERAL VASCULAR CATHETERIZATION Bilateral 08/16/2015   Procedure: Lower Extremity Angiography;  Surgeon: Elam Dutch, MD;  Location: [place] [facility];  Service: Cardiovascular;  Laterality: Bilateral;  . PERIPHERAL VASCULAR CATHETERIZATION Left 08/16/2015   Procedure: Peripheral Vascular Intervention;  Surgeon: Elam Dutch, MD;  Location: [place] [facility];  Service: Cardiovascular;  Laterality: Left;  SFA STENT X 2    Allergies: Patient has no known allergies.  Medications: Prior to Admission medications   Medication Sig Start Date End Date Taking? Authorizing Provider  amLODipine (NORVASC) 10 MG tablet Take 1 tablet (10 mg total) by mouth daily. Reported on 07/13/2015 12/31/16 12/31/17 Yes Charlott Rakes, MD  aspirin 81 MG EC tablet Take 1 tablet (81 mg total) by mouth daily. Reported on 07/13/2015 07/02/16  Yes Charlott Rakes, MD  atorvastatin (LIPITOR) 80 MG tablet Take 1 tablet (80 mg total) by mouth daily. 02/15/17  Yes Charlott Rakes, MD  carvedilol (COREG) 25 MG tablet Take 1 tablet (25 mg total) by mouth 2 (two) times daily. 12/31/16 12/31/17 Yes Charlott Rakes, MD  Cholecalciferol (VITAMIN D-3) 1000 units CAPS Take 1,000 Units by mouth daily.    Yes [provider]  docusate sodium (COLACE) 100 MG capsule TAKE ONE CAPSULE BY MOUTH TWICE A DAY 10/26/16  Yes Newlin, Enobong, MD  furosemide (LASIX) 40 MG tablet Take 40 mg by mouth twice daily x 3 days only, then go back to taking 40 mg by mouth daily thereafter. Patient taking differently: Take  40 mg by mouth 2 (two) times daily. Take 40 mg by mouth twice daily x 3 days only, then go back to taking 40 mg by mouth daily thereafter. 01/29/17  Yes Dorothy Spark, MD  gabapentin (NEURONTIN) 300 MG capsule Take 1 capsule (300 mg total) by mouth 3 (three) times daily. 12/31/16  Yes Charlott Rakes, MD  glipiZIDE (GLUCOTROL) 10 MG tablet Take 1 tablet (10 mg total) by mouth  2 (two) times daily before a meal. 12/31/16  Yes Newlin, Enobong, MD  hydrALAZINE (APRESOLINE) 100 MG tablet Take 1 tablet (100 mg total) by mouth 2 (two) times daily. 12/31/16  Yes Charlott Rakes, MD  isosorbide mononitrate (IMDUR) 30 MG 24 hr tablet Take 3 tablets (90 mg total) by mouth daily. 02/08/17  Yes Charlott Rakes, MD  nitroGLYCERIN (NITROSTAT) 0.4 MG SL tablet Place 1 tablet (0.4 mg total) under the tongue every 5 (five) minutes as needed for chest pain. 01/18/17 04/18/17 Yes Dorothy Spark, MD  Omega-3 Fatty Acids (FISH OIL) 1000 MG CAPS Take 1,000 mg by mouth daily. Reported on 07/13/2015   Yes [provider]  sitaGLIPtin (JANUVIA) 50 MG tablet Take 1 tablet (50 mg total) by mouth daily. 02/15/17  Yes Charlott Rakes, MD  blood glucose meter kit and supplies KIT Dispense based on patient and insurance preference. Use up to four times daily as directed. (FOR ICD-9 250.00, 250.01). 07/25/15   Ward, Delice Bison, DO  Blood Glucose Monitoring Suppl (ACCU-CHEK AVIVA) device Use as instructed three times daily. 04/22/16   Charlott Rakes, MD  clopidogrel (PLAVIX) 75 MG tablet Take 1 tablet (75 mg total) by mouth daily. 12/31/16   Charlott Rakes, MD  glucose blood (ACCU-CHEK AVIVA) test strip Use as instructed three times daily before meals. 04/22/16   Charlott Rakes, MD  Lancet Devices [facility]) lancets Use as instructed three times daily before meals. 04/22/16   Charlott Rakes, MD     Family History  Problem Relation Age of Onset  . Diabetes Mother   . Hypertension Father     Social History   Socioeconomic History  . Marital status: Married    Spouse name: None  . Number of children: None  . Years of education: None  . Highest education level: None  Social Needs  . Financial resource strain: None  . Food insecurity - worry: None  . Food insecurity - inability: None  . Transportation needs - medical: None  . Transportation needs - non-medical: None    Occupational History  . None  Tobacco Use  . Smoking status: Former Smoker    Packs/day: 0.00    Types: Cigarettes    Last attempt to quit: 01/13/2017    Years since quitting: 0.1  . Smokeless tobacco: Never Used  . Tobacco comment: Down to 3 cigarettes per day  Substance and Sexual Activity  . Alcohol use: Yes    Alcohol/week: 0.6 - 1.2 oz    Types: 1 - 2 Glasses of wine per week    Comment: on social occassions  . Drug use: No  . Sexual activity: Yes    Partners: Male  Other Topics Concern  . None  Social History Narrative  . None    Review of Systems: A 12 point ROS discussed and pertinent positives are indicated in the HPI above.  All other systems are negative.  Review of Systems  Constitutional: Negative for activity change, fatigue and fever.  Respiratory: Negative for cough.   Cardiovascular: Negative for  chest pain.  Gastrointestinal: Negative for abdominal pain.  Musculoskeletal: Negative for gait problem.  Neurological: Negative for weakness.  Psychiatric/Behavioral: Negative for behavioral problems and confusion.    Vital Signs: BP (!) 170/72   Pulse 64   Temp 98.3 F (36.8 C) (Oral)   Resp 18   Ht '5\' 4"'  (7.185 m)   Wt 195 lb (88.5 kg)   SpO2 98%   BMI 33.47 kg/m   Physical Exam  Constitutional: She is oriented to person, place, and time.  Cardiovascular: Normal rate, regular rhythm and normal heart sounds.  Pulmonary/Chest: Effort normal and breath sounds normal.  Abdominal: Soft. Bowel sounds are normal.  Musculoskeletal: Normal range of motion.  Left foot partial amputation  Neurological: She is alert and oriented to person, place, and time.  Skin: Skin is warm and dry.  Psychiatric: She has a normal mood and affect. Her behavior is normal. Judgment and thought content normal.  Nursing note and vitals reviewed.   Imaging: No results found.  Labs:  CBC: Recent Labs    01/03/17 0254 01/04/17 0551 01/14/17 0958 03/02/17 0645  WBC  4.9 5.6 10.2 12.9*  HGB 10.3* 10.6* 11.6 12.6  HCT 32.2* 32.8* 35.9 38.9  PLT 154 148* 308 288    COAGS: Recent Labs    09/17/16 0236 03/02/17 0645  INR 1.00 0.98    BMP: Recent Labs    01/05/17 0409 01/06/17 0646 01/14/17 0958 01/28/17 1201  NA 136 136 143 141  K 3.6 3.6 4.3 4.4  CL 100* 104 107* 105  CO2 '27 24 23 ' 18*  GLUCOSE 184* 132* 116* 103*  BUN 27* 19 19 26*  CALCIUM 8.8* 8.7* 9.2 9.1  CREATININE 2.38* 2.02* 2.21* 2.68*  GFRNONAA 21* 26* 24* 19*  GFRAA 25* 30* 27* 22*    LIVER FUNCTION TESTS: Recent Labs    05/08/16 0957 09/14/16 0809 09/16/16 0559 01/01/17 0917  BILITOT <0.2 0.4 0.5 0.6  AST '14 25 16 ' 33  ALT '13 21 14 22  ' ALKPHOS 105 131* 79 105  PROT 6.5 8.1 5.8* 7.8  ALBUMIN 3.5 3.2* 2.2* 3.7    TUMOR MARKERS: No results for input(s): AFPTM, CEA, CA199, CHROMGRNA in the last 8760 hours.  Assessment and Plan:  CKD 3 Rising Creatinine Proteinuria; hematuria Scheduled for random renal bx Risks and benefits discussed with the patient including, but not limited to bleeding, infection, damage to adjacent structures or low yield requiring additional tests.  All of the patient's questions were answered, patient is agreeable to proceed. Consent signed and in chart.   Thank you for this interesting consult.  I greatly enjoyed meeting Ivan Maskell and look forward to participating in their care.  A copy of this report was sent to the requesting provider on this date.  Electronically Signed: Lavonia Drafts, PA-C 03/02/2017, 7:43 AM   I spent a total of  30 Minutes   in face to face in clinical consultation, greater than 50% of which was counseling/coordinating care for random renal bx

## 2017-03-02 NOTE — Sedation Documentation (Signed)
Patient is resting comfortably. 

## 2017-03-08 ENCOUNTER — Ambulatory Visit (INDEPENDENT_AMBULATORY_CARE_PROVIDER_SITE_OTHER): Payer: Medicaid Other | Admitting: Orthopedic Surgery

## 2017-03-08 ENCOUNTER — Ambulatory Visit (INDEPENDENT_AMBULATORY_CARE_PROVIDER_SITE_OTHER): Payer: Self-pay | Admitting: Family

## 2017-03-08 ENCOUNTER — Encounter (INDEPENDENT_AMBULATORY_CARE_PROVIDER_SITE_OTHER): Payer: Self-pay | Admitting: Orthopedic Surgery

## 2017-03-08 VITALS — Ht 64.0 in | Wt 195.0 lb

## 2017-03-08 DIAGNOSIS — Z89432 Acquired absence of left foot: Secondary | ICD-10-CM

## 2017-03-08 DIAGNOSIS — B351 Tinea unguium: Secondary | ICD-10-CM

## 2017-03-08 NOTE — Progress Notes (Signed)
Office Visit Note   Patient: Rebekah Johnson           Date of Birth: 1960/03/09           MRN: [id number] Visit Date: 03/08/2017              Requested by: Charlott Rakes, MD [place], [place] [address] PCP: Charlott Rakes, MD  Chief Complaint  Patient presents with  . Right Foot - Wound Check, Nail Problem  . Left Foot - Follow-up    09/06/15 left transmet amputation.      HPI: Patient is a 57 year old woman who is seen in follow-up for left transmetatarsal amputation and right foot with onychomycotic nails she is currently wearing medical compression stockings.  Patient states that she spent the Christmas holidays in the hospital.  She states she recently has had a kidney biopsy due to elevated renal function tests.  Assessment & Plan: Visit Diagnoses:  1. Status post transmetatarsal amputation of foot, left ([place])   2. Onychomycosis     Plan: The nails were trimmed x5 callus pared x1.  Continue with her compression stockings continue with protective shoe wear follow-up in the office in 3 months.  Follow-Up Instructions: Return in about 3 months (around 06/08/2017).   Ortho Exam  Patient is alert, oriented, no adenopathy, well-dressed, normal affect, normal respiratory effort. Examination patient has an antalgic gait.  She has thickened discolored onychomycotic nails x5 to the right foot she cannot safely trim the nails on her own due to her diabetic insensate neuropathy.  She has no paronychial infections there is no plantar ulcers.  After informed consent the nails were trimmed x5 without complications.  Semination left foot she has a plantigrade foot with no ulcers.  She does have some callus medially and this was pared with a 10 blade knife without complications this was approximately 5 mm in diameter.  There is no redness no cellulitis no signs of infection.  Imaging: No results found. No images are attached to the encounter.  Labs: Lab Results    Component Value Date   HGBA1C 7.8 12/31/2016   HGBA1C 8.8 (H) 09/14/2016   HGBA1C 9.4 09/02/2016   ESRSEDRATE 57 (H) 07/13/2015   CRP 0.7 07/13/2015   LABURIC 5.7 07/13/2015   REPTSTATUS 01/04/2017 FINAL 01/04/2017   CULT (A) 01/01/2017    VIRIDANS STREPTOCOCCUS THE SIGNIFICANCE OF ISOLATING THIS ORGANISM FROM A SINGLE SET OF BLOOD CULTURES WHEN MULTIPLE SETS ARE DRAWN IS UNCERTAIN. PLEASE NOTIFY THE MICROBIOLOGY DEPARTMENT WITHIN ONE WEEK IF SPECIATION AND SENSITIVITIES ARE REQUIRED. Performed at [facility], [place] [address] [place],  [address]     [email or web address] [phone]  Body mass index is 33.47 kg/m.  Orders:  No orders of the defined types were placed in this encounter.  No orders of the defined types were placed in this encounter.    Procedures: No procedures performed  Clinical Data: No additional findings.  ROS:  All other systems negative, except as noted in the HPI. Review of Systems  Objective: Vital Signs: Ht 5\' 4"  (1.626 m)   Wt 195 lb (88.5 kg)   BMI 33.47 kg/m   Specialty Comments:  No specialty comments available.  PMFS History: Patient Active Problem List   Diagnosis Date Noted  . NSTEMI (non-ST elevated myocardial infarction) ([place]) 01/01/2017  . Chest pain 01/01/2017  . Tobacco abuse   . PVD (peripheral vascular disease) ([place])   . Proteinuria   . Obesity   .  Normocytic anemia   . Hypertension   . High cholesterol   . Foot amputation status ([place])   . Diabetes mellitus with neuropathy ([place])   . CKD (chronic kidney disease), stage III ([place])   . Chronic diastolic CHF (congestive heart failure) ([place])   . CAD (coronary artery disease) 10/27/2016  . Chronic kidney disease 09/23/2016  . Acute congestive heart failure ([place])   . Hypertensive heart disease with heart failure ([place])   . Acute diastolic CHF (congestive heart failure) ([place]) 09/14/2016  . Onychomycosis 12/24/2015  . Status post transmetatarsal amputation of foot,  left ([place]) 09/06/2015  . Diabetic foot ulcer ([place]) 07/13/2015  . Cellulitis 07/12/2015  . HLD (hyperlipidemia) 09/02/2012  . Peripheral nerve disease 09/02/2012  . Type 2 diabetes mellitus with peripheral angiopathy ([place]) 09/02/2012  . Hypertriglyceridemia 07/28/2012  . Peripheral vascular disease ([place]) 07/28/2012  . Avitaminosis D 07/28/2012  . Essential (primary) hypertension 06/10/2012  . History of biliary T-tube placement 06/10/2012  . Leg pain 06/10/2012  . Current tobacco use 06/10/2012  . Atrophic vaginitis 06/07/2012  . Type 2 diabetes mellitus ([place]) 06/07/2012   Past Medical History:  Diagnosis Date  . CAD (coronary artery disease)    a. NSTEM 09/2016: cath showing severe diffuse disease of the RCA, ramus and Cx with mild-mod disease of LAD; PCI would require multiple stents and significant contrast usage thus medical therapy recommended.  . Chronic diastolic CHF (congestive heart failure) ([place])   . CKD (chronic kidney disease), stage III ([place])   . Diabetes mellitus with neuropathy ([place])   . Foot amputation status ([place])    a. h/o left foot transmetatarsal amputation  . High cholesterol   . Hypertension    a. patent renal arteries by PV angio 08/2015. b. heavy proteinuria 09/2016 ? nephrotic.  [name] Normocytic anemia   . Obesity   . Proteinuria   . PVD (peripheral vascular disease) ([place])    a. status post left SFA and popliteal stent and left SFA PTA with drug coated balloon in 08/2015.  . Tobacco abuse     Family History  Problem Relation Age of Onset  . Diabetes Mother   . Hypertension Father     Past Surgical History:  Procedure Laterality Date  . ABDOMINAL HYSTERECTOMY    . AMPUTATION Left 09/06/2015   Procedure: LEFT TRANSMETATARSAL AMPUTATTION;  Surgeon: Newt Minion, MD;  Location: [facility];  Service: Orthopedics;  Laterality: Left;  . LEFT HEART CATH AND CORONARY ANGIOGRAPHY N/A 09/17/2016   Procedure: LEFT HEART CATH AND CORONARY ANGIOGRAPHY;  Surgeon: Jettie Booze, MD;  Location: [facility];  Service: Cardiovascular;  Laterality: N/A;  . PERIPHERAL VASCULAR CATHETERIZATION N/A 08/16/2015   Procedure: Abdominal Aortogram;  Surgeon: Elam Dutch, MD;  Location: [facility];  Service: Cardiovascular;  Laterality: N/A;  . PERIPHERAL VASCULAR CATHETERIZATION Bilateral 08/16/2015   Procedure: Lower Extremity Angiography;  Surgeon: Elam Dutch, MD;  Location: [facility];  Service: Cardiovascular;  Laterality: Bilateral;  . PERIPHERAL VASCULAR CATHETERIZATION Left 08/16/2015   Procedure: Peripheral Vascular Intervention;  Surgeon: Elam Dutch, MD;  Location: [facility];  Service: Cardiovascular;  Laterality: Left;  SFA STENT X 2   Social History   Occupational History  . Not on file  Tobacco Use  . Smoking status: Former Smoker    Packs/day: 0.00    Types: Cigarettes    Last attempt to quit: 01/13/2017    Years since quitting: 0.1  .  Smokeless tobacco: Never Used  . Tobacco comment: Down to 3 cigarettes per day  Substance and Sexual Activity  . Alcohol use: Yes    Alcohol/week: 0.6 - 1.2 oz    Types: 1 - 2 Glasses of wine per week    Comment: on social occassions  . Drug use: No  . Sexual activity: Yes    Partners: Male

## 2017-03-10 ENCOUNTER — Encounter (HOSPITAL_COMMUNITY): Payer: Self-pay

## 2017-03-23 ENCOUNTER — Other Ambulatory Visit: Payer: Self-pay

## 2017-03-23 ENCOUNTER — Emergency Department (HOSPITAL_BASED_OUTPATIENT_CLINIC_OR_DEPARTMENT_OTHER)
Admission: EM | Admit: 2017-03-23 | Discharge: 2017-03-23 | Disposition: A | Discharge status: Home / Self Care | Payer: Medicaid Other | Attending: Emergency Medicine | Admitting: Emergency Medicine

## 2017-03-23 ENCOUNTER — Encounter (HOSPITAL_BASED_OUTPATIENT_CLINIC_OR_DEPARTMENT_OTHER): Payer: Self-pay

## 2017-03-23 DIAGNOSIS — I130 Hypertensive heart and chronic kidney disease with heart failure and stage 1 through stage 4 chronic kidney disease, or unspecified chronic kidney disease: Secondary | ICD-10-CM | POA: Insufficient documentation

## 2017-03-23 DIAGNOSIS — Z7982 Long term (current) use of aspirin: Secondary | ICD-10-CM | POA: Insufficient documentation

## 2017-03-23 DIAGNOSIS — Z7902 Long term (current) use of antithrombotics/antiplatelets: Secondary | ICD-10-CM | POA: Insufficient documentation

## 2017-03-23 DIAGNOSIS — H5789 Other specified disorders of eye and adnexa: Secondary | ICD-10-CM | POA: Diagnosis present

## 2017-03-23 DIAGNOSIS — Z7984 Long term (current) use of oral hypoglycemic drugs: Secondary | ICD-10-CM | POA: Diagnosis not present

## 2017-03-23 DIAGNOSIS — Z79899 Other long term (current) drug therapy: Secondary | ICD-10-CM | POA: Insufficient documentation

## 2017-03-23 DIAGNOSIS — N183 Chronic kidney disease, stage 3 (moderate): Secondary | ICD-10-CM | POA: Diagnosis not present

## 2017-03-23 DIAGNOSIS — H5711 Ocular pain, right eye: Secondary | ICD-10-CM | POA: Insufficient documentation

## 2017-03-23 DIAGNOSIS — I2510 Atherosclerotic heart disease of native coronary artery without angina pectoris: Secondary | ICD-10-CM | POA: Diagnosis not present

## 2017-03-23 DIAGNOSIS — I252 Old myocardial infarction: Secondary | ICD-10-CM | POA: Insufficient documentation

## 2017-03-23 DIAGNOSIS — Z87891 Personal history of nicotine dependence: Secondary | ICD-10-CM | POA: Diagnosis not present

## 2017-03-23 DIAGNOSIS — I5032 Chronic diastolic (congestive) heart failure: Secondary | ICD-10-CM | POA: Diagnosis not present

## 2017-03-23 DIAGNOSIS — E1122 Type 2 diabetes mellitus with diabetic chronic kidney disease: Secondary | ICD-10-CM | POA: Insufficient documentation

## 2017-03-23 MED ORDER — FLUORESCEIN SODIUM 1 MG OP STRP
1.0000 | ORAL_STRIP | Freq: Once | OPHTHALMIC | Status: AC
  Administered 2017-03-23: 1 via OPHTHALMIC
  Filled 2017-03-23: qty 1

## 2017-03-23 MED ORDER — TETRACAINE HCL 0.5 % OP SOLN
2.0000 [drp] | Freq: Once | OPHTHALMIC | Status: AC
  Administered 2017-03-23: 2 [drp] via OPHTHALMIC
  Filled 2017-03-23: qty 4

## 2017-03-23 MED ORDER — DORZOLAMIDE HCL-TIMOLOL MAL 2-0.5 % OP SOLN: 1.0000 [drp] | Freq: Three times a day (TID) | OPHTHALMIC | 0 refills | Status: DC

## 2017-03-23 NOTE — ED Notes (Signed)
Pt verbalizes understanding of d/c instructions and denies any further needs at this time. 

## 2017-03-23 NOTE — Discharge Instructions (Signed)
Begin using Cosopt drops in the right eye 3 times daily, 1 drop each time.  Please go to Dr. Roda Shutters office at [facility] at 8 AM tomorrow morning.  Please return to the emergency department if you develop any new or worsening symptoms.

## 2017-03-23 NOTE — ED Triage Notes (Signed)
C/o right eye pain, redness x 4 days-denies injury-NAD-steady gait

## 2017-03-24 ENCOUNTER — Other Ambulatory Visit: Payer: Self-pay | Admitting: Family Medicine

## 2017-03-24 DIAGNOSIS — I130 Hypertensive heart and chronic kidney disease with heart failure and stage 1 through stage 4 chronic kidney disease, or unspecified chronic kidney disease: Secondary | ICD-10-CM

## 2017-03-24 DIAGNOSIS — I5033 Acute on chronic diastolic (congestive) heart failure: Secondary | ICD-10-CM

## 2017-03-24 DIAGNOSIS — I2510 Atherosclerotic heart disease of native coronary artery without angina pectoris: Secondary | ICD-10-CM

## 2017-03-24 NOTE — ED Provider Notes (Signed)
[facility] EMERGENCY DEPARTMENT Provider Note   CSN: [id number] Arrival date & time: 03/23/17  1830     History   Chief Complaint Chief Complaint  Patient presents with  . Eye Pain    HPI Rebekah Johnson is a 57 y.o. female with history of CAD, diabetes, CHF who presents with a 4-day history of right eye irritation, redness, and drainage.  She has had associated green drainage in the mornings.  She reports blurry vision.  She reports it started off feeling like there was something in her eye.  She does not believe anything got in her eye.  She has not tried anything at home for symptoms.  She has had associated redness.  She denies any recent upper respiratory infection.  She denies any fevers or other complaints.  She reports having an eye doctor appointment scheduled in the next few weeks as a consultation for her diabetes, however does not have an eye doctor currently.  HPI  Past Medical History:  Diagnosis Date  . CAD (coronary artery disease)    a. NSTEM 09/2016: cath showing severe diffuse disease of the RCA, ramus and Cx with mild-mod disease of LAD; PCI would require multiple stents and significant contrast usage thus medical therapy recommended.  . Chronic diastolic CHF (congestive heart failure) ([place])   . CKD (chronic kidney disease), stage III ([place])   . Diabetes mellitus with neuropathy ([place])   . Foot amputation status ([place])    a. h/o left foot transmetatarsal amputation  . High cholesterol   . Hypertension    a. patent renal arteries by PV angio 08/2015. b. heavy proteinuria 09/2016 ? nephrotic.  [name] Normocytic anemia   . Obesity   . Proteinuria   . PVD (peripheral vascular disease) ([place])    a. status post left SFA and popliteal stent and left SFA PTA with drug coated balloon in 08/2015.  . Tobacco abuse     Patient Active Problem List   Diagnosis Date Noted  . NSTEMI (non-ST elevated myocardial infarction) ([place]) 01/01/2017  . Chest pain 01/01/2017  .  Tobacco abuse   . PVD (peripheral vascular disease) ([place])   . Proteinuria   . Obesity   . Normocytic anemia   . Hypertension   . High cholesterol   . Foot amputation status ([place])   . Diabetes mellitus with neuropathy ([place])   . CKD (chronic kidney disease), stage III ([place])   . Chronic diastolic CHF (congestive heart failure) ([place])   . CAD (coronary artery disease) 10/27/2016  . Chronic kidney disease 09/23/2016  . Acute congestive heart failure ([place])   . Hypertensive heart disease with heart failure ([place])   . Acute diastolic CHF (congestive heart failure) ([place]) 09/14/2016  . Onychomycosis 12/24/2015  . Status post transmetatarsal amputation of foot, left ([place]) 09/06/2015  . Diabetic foot ulcer ([place]) 07/13/2015  . Cellulitis 07/12/2015  . HLD (hyperlipidemia) 09/02/2012  . Peripheral nerve disease 09/02/2012  . Type 2 diabetes mellitus with peripheral angiopathy ([place]) 09/02/2012  . Hypertriglyceridemia 07/28/2012  . Peripheral vascular disease ([place]) 07/28/2012  . Avitaminosis D 07/28/2012  . Essential (primary) hypertension 06/10/2012  . History of biliary T-tube placement 06/10/2012  . Leg pain 06/10/2012  . Current tobacco use 06/10/2012  . Atrophic vaginitis 06/07/2012  . Type 2 diabetes mellitus ([place]) 06/07/2012    Past Surgical History:  Procedure Laterality Date  . ABDOMINAL HYSTERECTOMY    . AMPUTATION Left 09/06/2015   Procedure: LEFT TRANSMETATARSAL AMPUTATTION;  Surgeon: Beverely Low  Fernanda Drum, MD;  Location: [facility];  Service: Orthopedics;  Laterality: Left;  . LEFT HEART CATH AND CORONARY ANGIOGRAPHY N/A 09/17/2016   Procedure: LEFT HEART CATH AND CORONARY ANGIOGRAPHY;  Surgeon: Jettie Booze, MD;  Location: [facility];  Service: Cardiovascular;  Laterality: N/A;  . PERIPHERAL VASCULAR CATHETERIZATION N/A 08/16/2015   Procedure: Abdominal Aortogram;  Surgeon: Elam Dutch, MD;  Location: [facility];  Service: Cardiovascular;  Laterality: N/A;  .  PERIPHERAL VASCULAR CATHETERIZATION Bilateral 08/16/2015   Procedure: Lower Extremity Angiography;  Surgeon: Elam Dutch, MD;  Location: [facility];  Service: Cardiovascular;  Laterality: Bilateral;  . PERIPHERAL VASCULAR CATHETERIZATION Left 08/16/2015   Procedure: Peripheral Vascular Intervention;  Surgeon: Elam Dutch, MD;  Location: [facility];  Service: Cardiovascular;  Laterality: Left;  SFA STENT X 2    OB History    No data available       Home Medications    Prior to Admission medications   Medication Sig Start Date End Date Taking? Authorizing Provider  amLODipine (NORVASC) 10 MG tablet Take 1 tablet (10 mg total) by mouth daily. Reported on 07/13/2015 12/31/16 12/31/17  Charlott Rakes, MD  aspirin 81 MG EC tablet Take 1 tablet (81 mg total) by mouth daily. Reported on 07/13/2015 07/02/16   Charlott Rakes, MD  atorvastatin (LIPITOR) 80 MG tablet Take 1 tablet (80 mg total) by mouth daily. 02/15/17   Charlott Rakes, MD  blood glucose meter kit and supplies KIT Dispense based on patient and insurance preference. Use up to four times daily as directed. (FOR ICD-9 250.00, 250.01). 07/25/15   Ward, Delice Bison, DO  Blood Glucose Monitoring Suppl (ACCU-CHEK AVIVA) device Use as instructed three times daily. 04/22/16   Charlott Rakes, MD  carvedilol (COREG) 25 MG tablet Take 1 tablet (25 mg total) by mouth 2 (two) times daily. 12/31/16 12/31/17  Charlott Rakes, MD  Cholecalciferol (VITAMIN D-3) 1000 units CAPS Take 1,000 Units by mouth daily.     [provider]  clopidogrel (PLAVIX) 75 MG tablet Take 1 tablet (75 mg total) by mouth daily. 12/31/16   Charlott Rakes, MD  docusate sodium (COLACE) 100 MG capsule TAKE ONE CAPSULE BY MOUTH TWICE A DAY 10/26/16   Charlott Rakes, MD  dorzolamide-timolol (COSOPT) 22.3-6.8 MG/ML ophthalmic solution Place 1 drop into the right eye 3 (three) times daily. 03/23/17   Breeonna Mone, Bea Graff, PA-C  furosemide (LASIX) 40 MG tablet  Take 40 mg by mouth twice daily x 3 days only, then go back to taking 40 mg by mouth daily thereafter. Patient taking differently: Take 40 mg by mouth 2 (two) times daily. Take 40 mg by mouth twice daily x 3 days only, then go back to taking 40 mg by mouth daily thereafter. 01/29/17   Dorothy Spark, MD  gabapentin (NEURONTIN) 300 MG capsule Take 1 capsule (300 mg total) by mouth 3 (three) times daily. 12/31/16   Charlott Rakes, MD  glipiZIDE (GLUCOTROL) 10 MG tablet Take 1 tablet (10 mg total) by mouth 2 (two) times daily before a meal. 12/31/16   Newlin, Enobong, MD  glucose blood (ACCU-CHEK AVIVA) test strip Use as instructed three times daily before meals. 04/22/16   Charlott Rakes, MD  hydrALAZINE (APRESOLINE) 100 MG tablet Take 1 tablet (100 mg total) by mouth 2 (two) times daily. 12/31/16   Charlott Rakes, MD  isosorbide mononitrate (IMDUR) 30 MG 24 hr tablet Take 3 tablets (90 mg total)  by mouth daily. 02/08/17   Charlott Rakes, MD  Lancet Devices [facility]) lancets Use as instructed three times daily before meals. 04/22/16   Charlott Rakes, MD  nitroGLYCERIN (NITROSTAT) 0.4 MG SL tablet Place 1 tablet (0.4 mg total) under the tongue every 5 (five) minutes as needed for chest pain. 01/18/17 04/18/17  Dorothy Spark, MD  Omega-3 Fatty Acids (FISH OIL) 1000 MG CAPS Take 1,000 mg by mouth daily. Reported on 07/13/2015    [provider]  sitaGLIPtin (JANUVIA) 50 MG tablet Take 1 tablet (50 mg total) by mouth daily. 02/15/17   Charlott Rakes, MD    Family History Family History  Problem Relation Age of Onset  . Diabetes Mother   . Hypertension Father     Social History Social History   Tobacco Use  . Smoking status: Former Smoker    Packs/day: 0.00    Types: Cigarettes    Last attempt to quit: 01/13/2017    Years since quitting: 0.1  . Smokeless tobacco: Never Used  . Tobacco comment: Down to 3 cigarettes per day  Substance Use Topics  . Alcohol use: Yes     Alcohol/week: 0.6 - 1.2 oz    Types: 1 - 2 Glasses of wine per week    Comment: on social occassions  . Drug use: No     Allergies   Patient has no known allergies.   Review of Systems Review of Systems  Constitutional: Negative for fever.  Eyes: Positive for pain, discharge, redness and visual disturbance. Negative for photophobia.     Physical Exam Updated Vital Signs BP (!) 161/72 (BP Location: Left Arm)   Pulse 88   Temp 98.5 F (36.9 C) (Oral)   Resp 16   Ht '5\' 4"'  (1.626 m)   Wt 88.5 kg (195 lb)   SpO2 99%   BMI 33.47 kg/m   Physical Exam  Constitutional: She appears well-developed and well-nourished. No distress.  HENT:  Head: Normocephalic and atraumatic.  Mouth/Throat: Oropharynx is clear and moist. No oropharyngeal exudate.  Eyes: Conjunctivae and EOM are normal. Pupils are equal, round, and reactive to light. Right eye exhibits no discharge. Left eye exhibits no discharge. No scleral icterus.  Conjunctiva is injected, no uptake on fluorescein staining, ocular melanosis, Tono-Pen pressures are significantly elevated in the right eye, average 40.  Left averages 20.  No consensual photophobia or photophobia.  No pain or difficulty with EOMs.   Visual Acuity  Right Eye Distance: 20/200 Left Eye Distance: 20/50 Bilateral Distance: 20/40     Neck: Normal range of motion. Neck supple. No thyromegaly present.  Cardiovascular: Normal rate, regular rhythm, normal heart sounds and intact distal pulses. Exam reveals no gallop and no friction rub.  No murmur heard. Pulmonary/Chest: Effort normal and breath sounds normal. No stridor. No respiratory distress. She has no wheezes. She has no rales.  Abdominal: Soft. Bowel sounds are normal. She exhibits no distension. There is no tenderness. There is no rebound and no guarding.  Musculoskeletal: She exhibits no edema.  Lymphadenopathy:    She has no cervical adenopathy.  Neurological: She is alert. Coordination normal.    Skin: Skin is warm and dry. No rash noted. She is not diaphoretic. No pallor.  Psychiatric: She has a normal mood and affect.  Nursing note and vitals reviewed.    ED Treatments / Results  Labs (all labs ordered are listed, but only abnormal results are displayed) Labs Reviewed - No data to display  EKG  EKG Interpretation None       Radiology No results found.  Procedures Procedures (including critical care time)  Medications Ordered in ED Medications  fluorescein ophthalmic strip 1 strip (1 strip Right Eye Given 03/23/17 2204)  tetracaine (PONTOCAINE) 0.5 % ophthalmic solution 2 drop (2 drops Right Eye Given 03/23/17 2204)     Initial Impression / Assessment and Plan / ED Course  I have reviewed the triage vital signs and the nursing notes.  Pertinent labs & imaging results that were available during my care of the patient were reviewed by me and considered in my medical decision making (see chart for details).     Patient with significantly elevated intraocular pressure in conjunction with blurry vision, redness, and eye irritation in the right eye.  I consulted ophthalmologist, Dr. Eulas Post, who advised patient be started on Cosopt and to be seen in the office at 8 AM tomorrow (~8 hours from patient's discharge).  Patient will proceed to office.  Patient denies any significant or severe pain at this time and low suspicion for acute angle closure glaucoma at this time.  No signs of corneal abrasion or ulcer.  Patient understands and agrees with plan.  Patient vitals stable and discharged in satisfactory condition. I discussed patient case with Dr. Sherry Ruffing who guided the patient's management and agrees with plan.   Final Clinical Impressions(s) / ED Diagnoses   Final diagnoses:  Eye irritation  Pain of right eye    ED Discharge Orders        Ordered    dorzolamide-timolol (COSOPT) 22.3-6.8 MG/ML ophthalmic solution  3 times daily     03/23/17 2259       Frederica Kuster, PA-C 03/24/17 0100    Tegeler, Gwenyth Allegra, MD 03/24/17 [phone]

## 2017-03-25 LAB — HM DIABETES EYE EXAM

## 2017-03-26 ENCOUNTER — Telehealth: Payer: Self-pay | Admitting: Family Medicine

## 2017-03-26 NOTE — Telephone Encounter (Signed)
6 page, paperwork received through fax 03-26-17. ([facility])

## 2017-03-29 ENCOUNTER — Other Ambulatory Visit: Payer: Self-pay | Admitting: Pharmacist

## 2017-03-29 DIAGNOSIS — I10 Essential (primary) hypertension: Secondary | ICD-10-CM

## 2017-03-29 MED ORDER — CARVEDILOL 25 MG PO TABS: 25.0000 mg | ORAL_TABLET | Freq: Two times a day (BID) | ORAL | 2 refills | Status: DC

## 2017-03-31 ENCOUNTER — Ambulatory Visit: Payer: Self-pay | Admitting: Family Medicine

## 2017-03-31 DIAGNOSIS — H4051X2 Glaucoma secondary to other eye disorders, right eye, moderate stage: Secondary | ICD-10-CM | POA: Insufficient documentation

## 2017-03-31 DIAGNOSIS — N186 End stage renal disease: Secondary | ICD-10-CM | POA: Insufficient documentation

## 2017-05-03 ENCOUNTER — Ambulatory Visit: Payer: Self-pay | Admitting: Family Medicine

## 2017-05-10 ENCOUNTER — Ambulatory Visit (INDEPENDENT_AMBULATORY_CARE_PROVIDER_SITE_OTHER): Payer: Medicaid Other | Admitting: Orthopedic Surgery

## 2017-05-13 ENCOUNTER — Ambulatory Visit (INDEPENDENT_AMBULATORY_CARE_PROVIDER_SITE_OTHER): Payer: Medicaid Other | Admitting: Orthopedic Surgery

## 2017-05-18 ENCOUNTER — Encounter (INDEPENDENT_AMBULATORY_CARE_PROVIDER_SITE_OTHER): Payer: Self-pay | Admitting: Orthopedic Surgery

## 2017-05-18 ENCOUNTER — Ambulatory Visit (INDEPENDENT_AMBULATORY_CARE_PROVIDER_SITE_OTHER): Payer: Medicaid Other | Admitting: Orthopedic Surgery

## 2017-05-18 VITALS — Ht 64.0 in | Wt 195.0 lb

## 2017-05-18 DIAGNOSIS — B351 Tinea unguium: Secondary | ICD-10-CM | POA: Diagnosis not present

## 2017-05-18 DIAGNOSIS — Z89432 Acquired absence of left foot: Secondary | ICD-10-CM

## 2017-05-18 DIAGNOSIS — L97421 Non-pressure chronic ulcer of left heel and midfoot limited to breakdown of skin: Secondary | ICD-10-CM | POA: Diagnosis not present

## 2017-05-18 NOTE — Progress Notes (Signed)
Office Visit Note   Patient: Rebekah Johnson           Date of Birth: July 10, 1960           MRN: [id number] Visit Date: 05/18/2017              Requested by: Charlott Rakes, MD [place], [place] [address] PCP: Charlott Rakes, MD  Chief Complaint  Patient presents with  . Left Foot - Follow-up, Routine Post Op      HPI: Patient is a 57 year old woman who presents with onychomycotic nails of the right foot and a Waggoner grade 1 ulcer left foot status post transmetatarsal amputation of the left almost 2 years ago.  Patient denies any problems other than the painful onychomycotic nails and the ulcer on the left foot.  Assessment & Plan: Visit Diagnoses:  1. Onychomycosis   2. Status post transmetatarsal amputation of foot, left ([place])   3. Lower limb ulcer, heel or midfoot, left, limited to breakdown of skin ([place])     Plan: Ulcer was debrided of skin and soft tissue on the left foot nails were trimmed x5 on the right foot.  Follow-Up Instructions: Return in about 3 months (around 08/18/2017).   Ortho Exam  Patient is alert, oriented, no adenopathy, well-dressed, normal affect, normal respiratory effort. Examination patient has an antalgic gait.  She has a Waggoner grade 1 ulcer on the transmetatarsal amputation medially of the left foot.  After informed consent a 10 blade knife was used to debride the skin and soft tissue back to healthy viable tissue.  The ulcer is 10 mm in diameter and 3 mm deep.  Examination the right foot she has thickened discolored onychomycotic nails x5 she is unable to safely trim the nails on room the nails were trimmed x5 without complications.  There is no paronychial infection she has no plantar ulcers no venous ulcers.  Imaging: No results found. No images are attached to the encounter.  Labs: Lab Results  Component Value Date   HGBA1C 7.8 12/31/2016   HGBA1C 8.8 (H) 09/14/2016   HGBA1C 9.4 09/02/2016   ESRSEDRATE 57 (H)  07/13/2015   CRP 0.7 07/13/2015   LABURIC 5.7 07/13/2015   REPTSTATUS 01/04/2017 FINAL 01/04/2017   CULT (A) 01/01/2017    VIRIDANS STREPTOCOCCUS THE SIGNIFICANCE OF ISOLATING THIS ORGANISM FROM A SINGLE SET OF BLOOD CULTURES WHEN MULTIPLE SETS ARE DRAWN IS UNCERTAIN. PLEASE NOTIFY THE MICROBIOLOGY DEPARTMENT WITHIN ONE WEEK IF SPECIATION AND SENSITIVITIES ARE REQUIRED. Performed at [facility], [place] [address], [place], [place] [address]      Lab Results  Component Value Date   ALBUMIN 3.7 01/01/2017   ALBUMIN 2.2 (L) 09/16/2016   ALBUMIN 3.2 (L) 09/14/2016   LABURIC 5.7 07/13/2015    Body mass index is 33.47 kg/m.  Orders:  No orders of the defined types were placed in this encounter.  No orders of the defined types were placed in this encounter.    Procedures: No procedures performed  Clinical Data: No additional findings.  ROS:  All other systems negative, except as noted in the HPI. Review of Systems  Objective: Vital Signs: Ht 5\' 4"  (1.626 m)   Wt 195 lb (88.5 kg)   BMI 33.47 kg/m   Specialty Comments:  No specialty comments available.  PMFS History: Patient Active Problem List   Diagnosis Date Noted  . NSTEMI (non-ST elevated myocardial infarction) ([place]) 01/01/2017  . Chest pain 01/01/2017  . Tobacco  abuse   . PVD (peripheral vascular disease) ([place])   . Proteinuria   . Obesity   . Normocytic anemia   . Hypertension   . High cholesterol   . Foot amputation status ([place])   . Diabetes mellitus with neuropathy ([place])   . CKD (chronic kidney disease), stage III ([place])   . Chronic diastolic CHF (congestive heart failure) ([place])   . CAD (coronary artery disease) 10/27/2016  . Chronic kidney disease 09/23/2016  . Acute congestive heart failure ([place])   . Hypertensive heart disease with heart failure ([place])   . Acute diastolic CHF (congestive heart failure) ([place]) 09/14/2016  . Onychomycosis 12/24/2015  . Status post transmetatarsal amputation of  foot, left ([place]) 09/06/2015  . Diabetic foot ulcer ([place]) 07/13/2015  . Cellulitis 07/12/2015  . HLD (hyperlipidemia) 09/02/2012  . Peripheral nerve disease 09/02/2012  . Type 2 diabetes mellitus with peripheral angiopathy ([place]) 09/02/2012  . Hypertriglyceridemia 07/28/2012  . Peripheral vascular disease ([place]) 07/28/2012  . Avitaminosis D 07/28/2012  . Essential (primary) hypertension 06/10/2012  . History of biliary T-tube placement 06/10/2012  . Leg pain 06/10/2012  . Current tobacco use 06/10/2012  . Atrophic vaginitis 06/07/2012  . Type 2 diabetes mellitus ([place]) 06/07/2012   Past Medical History:  Diagnosis Date  . CAD (coronary artery disease)    a. NSTEM 09/2016: cath showing severe diffuse disease of the RCA, ramus and Cx with mild-mod disease of LAD; PCI would require multiple stents and significant contrast usage thus medical therapy recommended.  . Chronic diastolic CHF (congestive heart failure) ([place])   . CKD (chronic kidney disease), stage III ([place])   . Diabetes mellitus with neuropathy ([place])   . Foot amputation status ([place])    a. h/o left foot transmetatarsal amputation  . High cholesterol   . Hypertension    a. patent renal arteries by PV angio 08/2015. b. heavy proteinuria 09/2016 ? nephrotic.  [name] Normocytic anemia   . Obesity   . Proteinuria   . PVD (peripheral vascular disease) ([place])    a. status post left SFA and popliteal stent and left SFA PTA with drug coated balloon in 08/2015.  . Tobacco abuse     Family History  Problem Relation Age of Onset  . Diabetes Mother   . Hypertension Father     Past Surgical History:  Procedure Laterality Date  . ABDOMINAL HYSTERECTOMY    . AMPUTATION Left 09/06/2015   Procedure: LEFT TRANSMETATARSAL AMPUTATTION;  Surgeon: Newt Minion, MD;  Location: [facility];  Service: Orthopedics;  Laterality: Left;  . LEFT HEART CATH AND CORONARY ANGIOGRAPHY N/A 09/17/2016   Procedure: LEFT HEART CATH AND CORONARY ANGIOGRAPHY;  Surgeon:  Jettie Booze, MD;  Location: [facility];  Service: Cardiovascular;  Laterality: N/A;  . PERIPHERAL VASCULAR CATHETERIZATION N/A 08/16/2015   Procedure: Abdominal Aortogram;  Surgeon: Elam Dutch, MD;  Location: [facility];  Service: Cardiovascular;  Laterality: N/A;  . PERIPHERAL VASCULAR CATHETERIZATION Bilateral 08/16/2015   Procedure: Lower Extremity Angiography;  Surgeon: Elam Dutch, MD;  Location: [facility];  Service: Cardiovascular;  Laterality: Bilateral;  . PERIPHERAL VASCULAR CATHETERIZATION Left 08/16/2015   Procedure: Peripheral Vascular Intervention;  Surgeon: Elam Dutch, MD;  Location: [facility];  Service: Cardiovascular;  Laterality: Left;  SFA STENT X 2   Social History   Occupational History  . Not on file  Tobacco Use  . Smoking status: Former Smoker    Packs/day: 0.00  Types: Cigarettes    Last attempt to quit: 01/13/2017    Years since quitting: 0.3  . Smokeless tobacco: Never Used  . Tobacco comment: Down to 3 cigarettes per day  Substance and Sexual Activity  . Alcohol use: Yes    Alcohol/week: 0.6 - 1.2 oz    Types: 1 - 2 Glasses of wine per week    Comment: on social occassions  . Drug use: No  . Sexual activity: Not on file

## 2017-05-28 ENCOUNTER — Encounter: Payer: Self-pay | Admitting: Cardiology

## 2017-05-28 ENCOUNTER — Ambulatory Visit (INDEPENDENT_AMBULATORY_CARE_PROVIDER_SITE_OTHER): Payer: Medicaid Other | Admitting: Cardiology

## 2017-05-28 VITALS — BP 140/70 | HR 72 | Ht 64.0 in | Wt 196.2 lb

## 2017-05-28 DIAGNOSIS — I10 Essential (primary) hypertension: Secondary | ICD-10-CM

## 2017-05-28 DIAGNOSIS — E785 Hyperlipidemia, unspecified: Secondary | ICD-10-CM | POA: Diagnosis not present

## 2017-05-28 DIAGNOSIS — I2510 Atherosclerotic heart disease of native coronary artery without angina pectoris: Secondary | ICD-10-CM | POA: Diagnosis not present

## 2017-05-28 DIAGNOSIS — I5032 Chronic diastolic (congestive) heart failure: Secondary | ICD-10-CM | POA: Diagnosis not present

## 2017-05-28 DIAGNOSIS — I5033 Acute on chronic diastolic (congestive) heart failure: Secondary | ICD-10-CM

## 2017-05-28 DIAGNOSIS — I130 Hypertensive heart and chronic kidney disease with heart failure and stage 1 through stage 4 chronic kidney disease, or unspecified chronic kidney disease: Secondary | ICD-10-CM

## 2017-05-28 LAB — BASIC METABOLIC PANEL
BUN/Creatinine Ratio: 11 (ref 9–23)
BUN: 25 mg/dL — ABNORMAL HIGH (ref 6–24)
CO2: 22 mmol/L (ref 20–29)
Calcium: 9.4 mg/dL (ref 8.7–10.2)
Chloride: 102 mmol/L (ref 96–106)
Creatinine, Ser: 2.34 mg/dL — ABNORMAL HIGH (ref 0.57–1.00)
GFR calc Af Amer: 25 mL/min/{1.73_m2} — ABNORMAL LOW (ref >59)
GFR calc non Af Amer: 22 mL/min/{1.73_m2} — ABNORMAL LOW (ref >59)
Glucose: 268 mg/dL — ABNORMAL HIGH (ref 65–99)
Potassium: 3.6 mmol/L (ref 3.5–5.2)
Sodium: 140 mmol/L (ref 134–144)

## 2017-05-28 LAB — PRO B NATRIURETIC PEPTIDE: NT-Pro BNP: 1155 pg/mL — ABNORMAL HIGH (ref 0–287)

## 2017-05-28 MED ORDER — HYDRALAZINE HCL 100 MG PO TABS: 100.0000 mg | ORAL_TABLET | Freq: Two times a day (BID) | ORAL | 2 refills | Status: DC

## 2017-05-28 MED ORDER — CARVEDILOL 25 MG PO TABS: 25.0000 mg | ORAL_TABLET | Freq: Two times a day (BID) | ORAL | 2 refills | Status: DC

## 2017-05-28 MED ORDER — ATORVASTATIN CALCIUM 80 MG PO TABS: 80.0000 mg | ORAL_TABLET | Freq: Every day | ORAL | 2 refills | Status: DC

## 2017-05-28 MED ORDER — AMLODIPINE BESYLATE 10 MG PO TABS: 10.0000 mg | ORAL_TABLET | Freq: Every day | ORAL | 2 refills | Status: DC

## 2017-05-28 MED ORDER — ISOSORBIDE MONONITRATE ER 30 MG PO TB24: 90.0000 mg | ORAL_TABLET | Freq: Every day | ORAL | 1 refills | Status: DC

## 2017-05-28 NOTE — Progress Notes (Signed)
Cardiology Office Note    Date:  05/28/2017   ID:  Rebekah Johnson, DOB 07/07/1960, MRN [id number]  PCP:  Rebekah Rakes, MD  Cardiologist: Dr. Meda Johnson   Chief Complaint: 2 weeks follow up  History of Present Illness:   Rebekah Johnson is a 57 y.o. female with HTN (patent renal arteries by PV angio 08/2015), uncontrolled DM2, hyperlipidemia, CKD stage III by labs, diabetic neuropathy, PVD (status post left SFA and popliteal stent and left SFA PTA with drug coated balloon in 08/2015), left foot transmetatarsal amputation, tobacco abuse, obesity presents for follow up.   Admitted 09/2016 with acute on chronic diastolic CHF, sec to noncompliance - out of medications for 3 days. Found to have acute hypoxic respiratory failure with O2 sats 70s requiring bipap.  Felt to have new onset CHF, also with troponin of 2. EF 55-60% G2DD. Cath showed 100% prox CX to mid CX stenosis, RCA with 80% stenosis other with nonobstructive as well. PCI would require multiple stents and significant contrast, recommendws to treat medically. UA concerning for nephrotic process. Outpatient referral to nephrology.    Admitted 01/01/17 with another acute on chronic diastolic CHF - and NSTEMI,  Maximum troponin 4.9, because of previous cath findings  and CKD stage III conservative management was decided. She was diuresed..   Seen by Dr. Meda Johnson 01/15/17. Noted volume overload. Increased lasix to 66m BID. May consider PCI once evaluated by nephrologist or worsening of symptoms. Imdur increased to 956mqd for elevated BP.   Seen by oncology 01/21/16 for abnormal protein electrophoresis.  She was found to have a 0.5 g/dL on a serum protein electrophoresis with IgG kappa immunofixation. Felt potential ramification of having MGUS and the risk of progression to multiple myeloma. Recommended obtaining a skeletal survey and repeat her laboratory testing in 6 months.  Bone marrow biopsy may be needed to complete the staging depending on  the findings  05/28/2017, this is 5 months follow-up, the patient states that she has been trying to eat really healthy and avoid salt, she is wearing compression socks.  Has minimal lower extremity edema, no orthopnea or proximal nocturnal dyspnea.  She denies any chest pain has shortness of breath if she has to walk intermediate to longer distances.  Past Medical History:  Diagnosis Date  . CAD (coronary artery disease)    a. NSTEM 09/2016: cath showing severe diffuse disease of the RCA, ramus and Cx with mild-mod disease of LAD; PCI would require multiple stents and significant contrast usage thus medical therapy recommended.  . Chronic diastolic CHF (congestive heart failure) ([facility]  . CKD (chronic kidney disease), stage III ([facility] [place]  . Diabetes mellitus with neuropathy ([facility]  . Foot amputation status ([facility]   a. h/o left foot transmetatarsal amputation  . High cholesterol   . Hypertension    a. patent renal arteries by PV angio 08/2015. b. heavy proteinuria 09/2016 ? nephrotic.  . Rebekah Johnson   . Obesity   . Proteinuria   . PVD (peripheral vascular disease) ([facility]   a. status post left SFA and popliteal stent and left SFA PTA with drug coated balloon in 08/2015.  . Tobacco abuse     Past Surgical History:  Procedure Laterality Date  . ABDOMINAL HYSTERECTOMY    . AMPUTATION Left 09/06/2015   Procedure: LEFT TRANSMETATARSAL AMPUTATTION;  Surgeon: MaNewt MinionMD;  [facility] Service: [facility]ics;  Laterality: Left;  . LEFT HEART CATH AND CORONARY ANGIOGRAPHY N/A  09/17/2016   Procedure: LEFT HEART CATH AND CORONARY ANGIOGRAPHY;  Surgeon: Rebekah Booze, MD;  Location: [facility];  Service: Cardiovascular;  Laterality: N/A;  . PERIPHERAL VASCULAR CATHETERIZATION N/A 08/16/2015   Procedure: Abdominal Aortogram;  Surgeon: Rebekah Dutch, MD;  Location: [place] [facility];  Service: Cardiovascular;  Laterality: N/A;  . PERIPHERAL VASCULAR CATHETERIZATION Bilateral  08/16/2015   Procedure: Lower Extremity Angiography;  Surgeon: Rebekah Dutch, MD;  Location: [facility] [place] [facility];  Service: Cardiovascular;  Laterality: Bilateral;  . PERIPHERAL VASCULAR CATHETERIZATION Left 08/16/2015   Procedure: Peripheral Vascular Intervention;  Surgeon: Rebekah Dutch, MD;  Location: [place] [facility];  Service: Cardiovascular;  Laterality: Left;  SFA STENT X 2    Current Medications: Prior to Admission medications   Medication Sig Start Date End Date Taking? Authorizing Provider  amLODipine (NORVASC) 10 MG tablet Take 1 tablet (10 mg total) by mouth daily. Reported on 07/13/2015 12/31/16 12/31/17  Rebekah Morale, MD  aspirin 81 MG EC tablet Take 1 tablet (81 mg total) by mouth daily. Reported on 07/13/2015 07/02/16   Rebekah Morale, MD  atorvastatin (LIPITOR) 80 MG tablet Take 1 tablet (80 mg total) by mouth daily. 12/31/16   Rebekah Morale, MD  blood glucose meter kit and supplies KIT Dispense based on patient and insurance preference. Use up to four times daily as directed. (FOR ICD-9 250.00, 250.01). 07/25/15   Johnson, Rebekah Bison, DO  Blood Glucose Monitoring Suppl (ACCU-CHEK AVIVA) device Use as instructed three times daily. 04/22/16   Rebekah Morale, MD  carvedilol (COREG) 25 MG tablet Take 1 tablet (25 mg total) by mouth 2 (two) times daily. 12/31/16 12/31/17  Rebekah Morale, MD  Cholecalciferol (VITAMIN D-3) 1000 units CAPS Take 1,000 Units by mouth daily.     [provider]  clopidogrel (PLAVIX) 75 MG tablet Take 1 tablet (75 mg total) by mouth daily. 12/31/16   Rebekah Morale, MD  docusate sodium (COLACE) 100 MG capsule TAKE ONE CAPSULE BY MOUTH TWICE A DAY 10/26/16   Rebekah Morale, MD  furosemide (LASIX) 40 MG tablet Take 40 mg by mouth twice daily for 7 days only, then go back to taking 40 mg by mouth daily thereafter. 01/18/17   Rebekah Spark, MD  gabapentin (NEURONTIN) 300 MG capsule Take 1 capsule (300 mg total) by mouth 3 (three) times daily. 12/31/16   Rebekah Morale, MD  glipiZIDE (GLUCOTROL) 10 MG tablet Take 1 tablet (10 mg total) by mouth 2 (two) times daily before a meal. 12/31/16   Rebekah Morale, MD  glucose blood (ACCU-CHEK AVIVA) test strip Use as instructed three times daily before meals. 04/22/16   Rebekah Morale, MD  hydrALAZINE (APRESOLINE) 100 MG tablet Take 1 tablet (100 mg total) by mouth 2 (two) times daily. 12/31/16   Rebekah Morale, MD  isosorbide mononitrate (IMDUR) 30 MG 24 hr tablet Take 3 tablets (90 mg total) by mouth daily. 01/18/17   Rebekah Spark, MD  Lancet Devices [facility]) lancets Use as instructed three times daily before meals. 04/22/16   Rebekah Morale, MD  nitroGLYCERIN (NITROSTAT) 0.4 MG SL tablet Place 1 tablet (0.4 mg total) under the tongue every 5 (five) minutes as needed for chest pain. 01/18/17 04/18/17  Rebekah Spark, MD  Omega-3 Fatty Acids (FISH OIL) 1000 MG CAPS Take 1,000 mg by mouth daily. Reported on 07/13/2015    [provider]  sitaGLIPtin (JANUVIA) 50 MG tablet Take 1 tablet (50 mg total)  by mouth daily. 12/31/16   Rebekah Morale, MD    Allergies:   Patient has no known allergies.   Social History   Socioeconomic History  . Marital status: Married    Spouse name: Not on file  . Number of children: Not on file  . Years of education: Not on file  . Highest education level: Not on file  Occupational History  . Not on file  Social Needs  . Financial resource strain: Not on file  . Food insecurity:    Worry: Not on file    Inability: Not on file  . Transportation needs:    Medical: Not on file    Non-medical: Not on file  Tobacco Use  . Smoking status: Former Smoker    Packs/day: 0.00    Types: Cigarettes    Last attempt to quit: 01/13/2017    Years since quitting: 0.3  . Smokeless tobacco: Never Used  . Tobacco comment: Down to 3 cigarettes per day  Substance and Sexual Activity  . Alcohol use: Yes    Alcohol/week: 0.6 - 1.2 oz    Types: 1 - 2 Glasses of wine per  week    Comment: on social occassions  . Drug use: No  . Sexual activity: Not on file  Lifestyle  . Physical activity:    Days per week: Not on file    Minutes per session: Not on file  . Stress: Not on file  Relationships  . Social connections:    Talks on phone: Not on file    Gets together: Not on file    Attends religious service: Not on file    Active member of club or organization: Not on file    Attends meetings of clubs or organizations: Not on file    Relationship status: Not on file  Other Topics Concern  . Not on file  Social History Narrative  . Not on file     Family History:  The patient's family history includes Diabetes in her mother; Hypertension in her father.   ROS:   Please see the history of present illness.    ROS All other systems reviewed and are negative.   PHYSICAL EXAM:   VS:  BP 140/70   Pulse 72   Ht '5\' 4"'  (1.626 m)   Wt 196 lb 3.2 oz (89 kg)   SpO2 94%   BMI 33.68 kg/m    GEN: Morbidly obese female in no acute distress  HEENT: normal  Neck: +  JVD. NO carotid bruits, or masses Cardiac: RRR; no murmurs, rubs, or gallops,no edema  Respiratory: Faint bibasilar rales GI: soft, nontender, nondistended, + BS MS: no deformity or atrophy  Skin: warm and dry, no rash Neuro:  Alert and Oriented x 3, Strength and sensation are intact Psych: euthymic mood, full affect  Wt Readings from Last 3 Encounters:  05/28/17 196 lb 3.2 oz (89 kg)  05/18/17 195 lb (88.5 kg)  03/23/17 195 lb (88.5 kg)      Studies/Labs Reviewed:   EKG:  EKG is not  ordered today.    Recent Labs: 09/14/2016: TSH 0.959 01/01/2017: ALT 22; B Natriuretic Peptide 962.9; Magnesium 1.7 01/28/2017: BUN 26; Creatinine, Ser 2.68; NT-Pro BNP 1,267; Potassium 4.4; Sodium 141 03/02/2017: Hemoglobin 12.6; Platelets 288   Lipid Panel    Component Value Date/Time   CHOL 93 09/16/2016 0559   TRIG 163 (H) 09/16/2016 0559   HDL 25 (L) 09/16/2016 0559   CHOLHDL 3.7 09/16/2016  0559   VLDL 33 09/16/2016 0559   LDLCALC 35 09/16/2016 0559    Additional studies/ records that were reviewed today include:   LEFT HEART CATH AND CORONARY ANGIOGRAPHY  Conclusion     Ost 1st Diag lesion, 25 %stenosed.  Ost 2nd Diag lesion, 25 %stenosed.  Ramus-1 lesion, 50 %stenosed.  Ramus-2 lesion, 80 %stenosed.  Prox Cx to Mid Cx lesion, 100 %stenosed.  Prox RCA to Mid RCA lesion, 80 %stenosed.  RPDA lesion, 80 %stenosed.  Dist RCA lesion, 80 %stenosed.  Mid RCA to Dist RCA lesion, 80 %stenosed.  LV end diastolic pressure is mildly elevated.  There is no aortic valve stenosis.  Severe diffuse disease of the RCA, Ramus and circumflex. LAD with only mild to moderate diffuse disease. PCI would require multiple stents and significant contrast usage. For now, would treat medically.     Echo 09/15/16 Study Conclusions  - Left ventricle: The cavity size was normal. Wall thickness was increased in a pattern of mild LVH. Systolic function was normal. The estimated ejection fraction was in the range of 55% to 60%. There is akinesis of the basal-midinferolateral myocardium. Features are consistent with a pseudonormal left ventricular filling pattern, with concomitant abnormal relaxation and increased filling pressure (grade 2 diastolic dysfunction). Doppler parameters are consistent with high ventricular filling pressure. - Mitral valve: There was mild regurgitation. - Left atrium: The atrium was mildly dilated.  Impressions:  - Akinesis of the basal/mid inferolateral wall with overall preserved LV systolic function; moderate diastolic dysfunction; mild LVH; mild MR; mild LAE.   ASSESSMENT & PLAN:    1. Acute on chronic diastolic CHF -The patient maintains her weight.  She is asymptomatic and appears euvolemic, will continue the same management.  We will recheck BMP and BNP today.  2. CAD  - Cath as above. Medically managed.  May consider PCI once evaluated by nephrologist or worsening of symptoms.  She has stable symptoms. -Continue with aspirin, Plavix, statin, Imdur and beta-blocker.  3. Hypertensive heart disease -Stable and well controlled on current regimen.  4. PVD - on Plavix  5. CKD stage III -Followed by nephrologist  6. Tobacco smoking -She has quit in January 2019 she is congratulated on it.  Follow-up in 6 months.    Medication Adjustments/Labs and Tests Ordered: Current medicines are reviewed at length with the patient today.  Concerns regarding medicines are outlined above.  Medication changes, Labs and Tests ordered today are listed in the Patient Instructions below. Patient Instructions  Medication Instructions:   Your physician recommends that you continue on your current medications as directed. Please refer to the Current Medication list given to you today.   Labwork:  TODAY---BMET AND PRO-BNP     Follow-Up:  Your physician wants you to follow-up in: [place] will receive a reminder letter in the mail two months in advance. If you don't receive a letter, please call our office to schedule the follow-up appointment.        If you need a refill on your cardiac medications before your next appointment, please call your pharmacy.      Signed, Ena Dawley, MD  05/28/2017 12:45 PM    [address], [place], [place]  [address] Phone: [phone]; Fax: [phone]

## 2017-05-28 NOTE — Patient Instructions (Signed)
Medication Instructions:   Your physician recommends that you continue on your current medications as directed. Please refer to the Current Medication list given to you today.   Labwork:  TODAY---BMET AND PRO-BNP     Follow-Up:  Your physician wants you to follow-up in: [place] will receive a reminder letter in the mail two months in advance. If you don't receive a letter, please call our office to schedule the follow-up appointment.        If you need a refill on your cardiac medications before your next appointment, please call your pharmacy.

## 2017-06-04 ENCOUNTER — Emergency Department (HOSPITAL_BASED_OUTPATIENT_CLINIC_OR_DEPARTMENT_OTHER): Payer: Medicaid Other

## 2017-06-04 ENCOUNTER — Encounter (HOSPITAL_BASED_OUTPATIENT_CLINIC_OR_DEPARTMENT_OTHER): Payer: Self-pay | Admitting: *Deleted

## 2017-06-04 ENCOUNTER — Other Ambulatory Visit: Payer: Self-pay

## 2017-06-04 ENCOUNTER — Emergency Department (HOSPITAL_BASED_OUTPATIENT_CLINIC_OR_DEPARTMENT_OTHER)
Admission: EM | Admit: 2017-06-04 | Discharge: 2017-06-04 | Disposition: A | Discharge status: Home / Self Care | Payer: Medicaid Other | Attending: Emergency Medicine | Admitting: Emergency Medicine

## 2017-06-04 ENCOUNTER — Other Ambulatory Visit: Payer: Self-pay | Admitting: Family Medicine

## 2017-06-04 ENCOUNTER — Ambulatory Visit: Payer: Medicaid Other | Admitting: Family Medicine

## 2017-06-04 DIAGNOSIS — N183 Chronic kidney disease, stage 3 (moderate): Secondary | ICD-10-CM | POA: Diagnosis not present

## 2017-06-04 DIAGNOSIS — I739 Peripheral vascular disease, unspecified: Secondary | ICD-10-CM

## 2017-06-04 DIAGNOSIS — E1122 Type 2 diabetes mellitus with diabetic chronic kidney disease: Secondary | ICD-10-CM | POA: Insufficient documentation

## 2017-06-04 DIAGNOSIS — R4689 Other symptoms and signs involving appearance and behavior: Secondary | ICD-10-CM | POA: Insufficient documentation

## 2017-06-04 DIAGNOSIS — I2510 Atherosclerotic heart disease of native coronary artery without angina pectoris: Secondary | ICD-10-CM | POA: Diagnosis not present

## 2017-06-04 DIAGNOSIS — Z9119 Patient's noncompliance with other medical treatment and regimen: Secondary | ICD-10-CM | POA: Diagnosis not present

## 2017-06-04 DIAGNOSIS — Z7982 Long term (current) use of aspirin: Secondary | ICD-10-CM | POA: Insufficient documentation

## 2017-06-04 DIAGNOSIS — Z87891 Personal history of nicotine dependence: Secondary | ICD-10-CM | POA: Insufficient documentation

## 2017-06-04 DIAGNOSIS — Z89432 Acquired absence of left foot: Secondary | ICD-10-CM | POA: Diagnosis not present

## 2017-06-04 DIAGNOSIS — R0981 Nasal congestion: Secondary | ICD-10-CM | POA: Diagnosis not present

## 2017-06-04 DIAGNOSIS — Z91199 Patient's noncompliance with other medical treatment and regimen due to unspecified reason: Secondary | ICD-10-CM

## 2017-06-04 DIAGNOSIS — I130 Hypertensive heart and chronic kidney disease with heart failure and stage 1 through stage 4 chronic kidney disease, or unspecified chronic kidney disease: Secondary | ICD-10-CM | POA: Insufficient documentation

## 2017-06-04 DIAGNOSIS — R0602 Shortness of breath: Secondary | ICD-10-CM | POA: Diagnosis present

## 2017-06-04 DIAGNOSIS — I5032 Chronic diastolic (congestive) heart failure: Secondary | ICD-10-CM | POA: Insufficient documentation

## 2017-06-04 DIAGNOSIS — Z7984 Long term (current) use of oral hypoglycemic drugs: Secondary | ICD-10-CM | POA: Insufficient documentation

## 2017-06-04 DIAGNOSIS — J3489 Other specified disorders of nose and nasal sinuses: Secondary | ICD-10-CM

## 2017-06-04 LAB — BASIC METABOLIC PANEL
Anion gap: 11 (ref 5–15)
BUN: 24 mg/dL — AB (ref 6–20)
CHLORIDE: 106 mmol/L (ref 101–111)
CO2: 19 mmol/L — ABNORMAL LOW (ref 22–32)
CREATININE: 2.19 mg/dL — AB (ref 0.44–1.00)
Calcium: 9.2 mg/dL (ref 8.9–10.3)
GFR calc Af Amer: 27 mL/min — ABNORMAL LOW (ref >60)
GFR calc non Af Amer: 23 mL/min — ABNORMAL LOW (ref >60)
GLUCOSE: 141 mg/dL — AB (ref 65–99)
Potassium: 3.4 mmol/L — ABNORMAL LOW (ref 3.5–5.1)
SODIUM: 136 mmol/L (ref 135–145)

## 2017-06-04 LAB — CBC WITH DIFFERENTIAL/PLATELET
Basophils Absolute: 0 10*3/uL (ref 0.0–0.1)
Basophils Relative: 0 %
EOS ABS: 0.2 10*3/uL (ref 0.0–0.7)
EOS PCT: 2 %
HCT: 41.9 % (ref 36.0–46.0)
HEMOGLOBIN: 14.6 g/dL (ref 12.0–15.0)
LYMPHS ABS: 3 10*3/uL (ref 0.7–4.0)
Lymphocytes Relative: 23 %
MCH: 27.6 pg (ref 26.0–34.0)
MCHC: 34.8 g/dL (ref 30.0–36.0)
MCV: 79.2 fL (ref 78.0–100.0)
MONOS PCT: 7 %
Monocytes Absolute: 1 10*3/uL (ref 0.1–1.0)
Neutro Abs: 9 10*3/uL — ABNORMAL HIGH (ref 1.7–7.7)
Neutrophils Relative %: 68 %
Platelets: 262 10*3/uL (ref 150–400)
RBC: 5.29 MIL/uL — ABNORMAL HIGH (ref 3.87–5.11)
RDW: 14.1 % (ref 11.5–15.5)
WBC: 13.2 10*3/uL — ABNORMAL HIGH (ref 4.0–10.5)

## 2017-06-04 LAB — BRAIN NATRIURETIC PEPTIDE: B Natriuretic Peptide: 273.1 pg/mL — ABNORMAL HIGH (ref 0.0–100.0)

## 2017-06-04 LAB — TROPONIN I
TROPONIN I: 0.03 ng/mL — AB (ref <0.03)
Troponin I: 0.03 ng/mL (ref <0.03)

## 2017-06-04 MED ORDER — ACETAMINOPHEN 500 MG PO TABS
1000.0000 mg | ORAL_TABLET | Freq: Once | ORAL | Status: AC
  Administered 2017-06-04: 1000 mg via ORAL
  Filled 2017-06-04: qty 2

## 2017-06-04 MED ORDER — OXYMETAZOLINE HCL 0.05 % NA SOLN
1.0000 | Freq: Once | NASAL | Status: DC
  Filled 2017-06-04: qty 15

## 2017-06-04 MED ORDER — FLUTICASONE PROPIONATE 50 MCG/ACT NA SUSP: 2.0000 | Freq: Every day | NASAL | 0 refills | Status: DC

## 2017-06-04 MED ORDER — AMLODIPINE BESYLATE 5 MG PO TABS
10.0000 mg | ORAL_TABLET | Freq: Once | ORAL | Status: AC
  Administered 2017-06-04: 10 mg via ORAL
  Filled 2017-06-04: qty 2

## 2017-06-04 NOTE — ED Notes (Signed)
Patient transported to X-ray 

## 2017-06-04 NOTE — ED Provider Notes (Signed)
[facility] EMERGENCY DEPARTMENT Provider Note   CSN: [id number] Arrival date & time: 06/04/17  0200     History   Chief Complaint Chief Complaint  Patient presents with  . Shortness of Breath    HPI Rebekah Johnson is a 57 y.o. female.  The history is provided by the patient. History limited by: patient yelling at nurse tech and EDP refusing to answer questions.  Shortness of Breath  This is a chronic problem. The problem occurs continuously.The problem has not changed since onset.Associated symptoms include rhinorrhea. Pertinent negatives include no fever, no neck pain, no vomiting and no leg swelling. Associated symptoms comments: Congestion and clearl colorless rhinorrhea. It is unknown what precipitated the problem. The treatment provided no relief. Associated medical issues do not include DVT or recent surgery.  Patient with a h/o poorly   Past Medical History:  Diagnosis Date  . CAD (coronary artery disease)    a. NSTEM 09/2016: cath showing severe diffuse disease of the RCA, ramus and Cx with mild-mod disease of LAD; PCI would require multiple stents and significant contrast usage thus medical therapy recommended.  . Chronic diastolic CHF (congestive heart failure) ([place])   . CKD (chronic kidney disease), stage III ([place])   . Diabetes mellitus with neuropathy ([place])   . Foot amputation status ([place])    a. h/o left foot transmetatarsal amputation  . High cholesterol   . Hypertension    a. patent renal arteries by PV angio 08/2015. b. heavy proteinuria 09/2016 ? nephrotic.  [name] Normocytic anemia   . Obesity   . Proteinuria   . PVD (peripheral vascular disease) ([place])    a. status post left SFA and popliteal stent and left SFA PTA with drug coated balloon in 08/2015.  . Tobacco abuse     Patient Active Problem List   Diagnosis Date Noted  . NSTEMI (non-ST elevated myocardial infarction) ([place]) 01/01/2017  . Chest pain 01/01/2017  . Tobacco abuse   . PVD (peripheral  vascular disease) ([place])   . Proteinuria   . Obesity   . Normocytic anemia   . Hypertension   . High cholesterol   . Foot amputation status ([place])   . Diabetes mellitus with neuropathy ([place])   . CKD (chronic kidney disease), stage III ([place])   . Chronic diastolic CHF (congestive heart failure) ([place])   . CAD (coronary artery disease) 10/27/2016  . Chronic kidney disease 09/23/2016  . Acute congestive heart failure ([place])   . Hypertensive heart disease with heart failure ([place])   . Acute diastolic CHF (congestive heart failure) ([place]) 09/14/2016  . Onychomycosis 12/24/2015  . Status post transmetatarsal amputation of foot, left ([place]) 09/06/2015  . Diabetic foot ulcer ([place]) 07/13/2015  . Cellulitis 07/12/2015  . HLD (hyperlipidemia) 09/02/2012  . Peripheral nerve disease 09/02/2012  . Type 2 diabetes mellitus with peripheral angiopathy ([place]) 09/02/2012  . Hypertriglyceridemia 07/28/2012  . Peripheral vascular disease ([place]) 07/28/2012  . Avitaminosis D 07/28/2012  . Essential (primary) hypertension 06/10/2012  . History of biliary T-tube placement 06/10/2012  . Leg pain 06/10/2012  . Current tobacco use 06/10/2012  . Atrophic vaginitis 06/07/2012  . Type 2 diabetes mellitus ([place]) 06/07/2012    Past Surgical History:  Procedure Laterality Date  . ABDOMINAL HYSTERECTOMY    . AMPUTATION Left 09/06/2015   Procedure: LEFT TRANSMETATARSAL AMPUTATTION;  Surgeon: Newt Minion, MD;  Location: [facility];  Service: Orthopedics;  Laterality: Left;  . LEFT HEART CATH AND CORONARY ANGIOGRAPHY N/A 09/17/2016  Procedure: LEFT HEART CATH AND CORONARY ANGIOGRAPHY;  Surgeon: Jettie Booze, MD;  Location: [facility];  Service: Cardiovascular;  Laterality: N/A;  . PERIPHERAL VASCULAR CATHETERIZATION N/A 08/16/2015   Procedure: Abdominal Aortogram;  Surgeon: Elam Dutch, MD;  Location: [facility];  Service: Cardiovascular;  Laterality: N/A;  . PERIPHERAL VASCULAR CATHETERIZATION  Bilateral 08/16/2015   Procedure: Lower Extremity Angiography;  Surgeon: Elam Dutch, MD;  Location: [facility];  Service: Cardiovascular;  Laterality: Bilateral;  . PERIPHERAL VASCULAR CATHETERIZATION Left 08/16/2015   Procedure: Peripheral Vascular Intervention;  Surgeon: Elam Dutch, MD;  Location: [facility];  Service: Cardiovascular;  Laterality: Left;  SFA STENT X 2     OB History   None      Home Medications    Prior to Admission medications   Medication Sig Start Date End Date Taking? Authorizing Provider  amLODipine (NORVASC) 10 MG tablet Take 1 tablet (10 mg total) by mouth daily. Reported on 07/13/2015 05/28/17   Dorothy Spark, MD  aspirin 81 MG EC tablet Take 1 tablet (81 mg total) by mouth daily. Reported on 07/13/2015 07/02/16   Charlott Rakes, MD  atorvastatin (LIPITOR) 80 MG tablet Take 1 tablet (80 mg total) by mouth daily. 05/28/17   Dorothy Spark, MD  blood glucose meter kit and supplies KIT Dispense based on patient and insurance preference. Use up to four times daily as directed. (FOR ICD-9 250.00, 250.01). 07/25/15   Ward, Delice Bison, DO  Blood Glucose Monitoring Suppl (ACCU-CHEK AVIVA) device Use as instructed three times daily. 04/22/16   Charlott Rakes, MD  carvedilol (COREG) 25 MG tablet Take 1 tablet (25 mg total) by mouth 2 (two) times daily. 05/28/17   Dorothy Spark, MD  Cholecalciferol (VITAMIN D-3) 1000 units CAPS Take 1,000 Units by mouth daily.     [provider]  clopidogrel (PLAVIX) 75 MG tablet Take 1 tablet (75 mg total) by mouth daily. 12/31/16   Charlott Rakes, MD  docusate sodium (COLACE) 100 MG capsule TAKE ONE CAPSULE BY MOUTH TWICE A DAY 10/26/16   Charlott Rakes, MD  dorzolamide-timolol (COSOPT) 22.3-6.8 MG/ML ophthalmic solution Place 1 drop into the right eye 3 (three) times daily. 03/23/17   Law, Bea Graff, PA-C  fluticasone (FLONASE) 50 MCG/ACT nasal spray Place 2 sprays into both nostrils daily.  06/04/17   Matia Zelada, MD  furosemide (LASIX) 40 MG tablet Take 40 mg by mouth twice daily x 3 days only, then go back to taking 40 mg by mouth daily thereafter. Patient taking differently: Take 40 mg by mouth 2 (two) times daily. Take 40 mg by mouth twice daily x 3 days only, then go back to taking 40 mg by mouth daily thereafter. 01/29/17   Dorothy Spark, MD  gabapentin (NEURONTIN) 300 MG capsule Take 1 capsule (300 mg total) by mouth 3 (three) times daily. 12/31/16   Charlott Rakes, MD  glipiZIDE (GLUCOTROL) 10 MG tablet Take 1 tablet (10 mg total) by mouth 2 (two) times daily before a meal. 12/31/16   Newlin, Enobong, MD  glucose blood (ACCU-CHEK AVIVA) test strip Use as instructed three times daily before meals. 04/22/16   Charlott Rakes, MD  hydrALAZINE (APRESOLINE) 100 MG tablet Take 1 tablet (100 mg total) by mouth 2 (two) times daily. 05/28/17   Dorothy Spark, MD  isosorbide mononitrate (IMDUR) 30 MG 24 hr tablet Take 3 tablets (90 mg total) by mouth daily. 05/28/17  Dorothy Spark, MD  Lancet Devices [facility]) lancets Use as instructed three times daily before meals. 04/22/16   Charlott Rakes, MD  neomycin-polymyxin b-dexamethasone (MAXITROL) 3.5-10000-0.1 OINT Place 1 application into the right eye Nightly.    [provider]  nitroGLYCERIN (NITROSTAT) 0.4 MG SL tablet Place 1 tablet (0.4 mg total) under the tongue every 5 (five) minutes as needed for chest pain. 01/18/17 04/18/17  Dorothy Spark, MD  Omega-3 Fatty Acids (FISH OIL) 1000 MG CAPS Take 1,000 mg by mouth daily. Reported on 07/13/2015    [provider]  sitaGLIPtin (JANUVIA) 50 MG tablet Take 1 tablet (50 mg total) by mouth daily. 02/15/17   Charlott Rakes, MD  sulfacetamide-prednisoLONE (VASOCIDIN) 10-0.23 % ophthalmic solution Place 1 drop into the right eye 2 (two) times daily.    [provider]    Family History Family History  Problem Relation Age of Onset  .  Diabetes Mother   . Hypertension Father     Social History Social History   Tobacco Use  . Smoking status: Former Smoker    Packs/day: 0.00    Types: Cigarettes    Last attempt to quit: 01/13/2017    Years since quitting: 0.3  . Smokeless tobacco: Never Used  . Tobacco comment: Down to 3 cigarettes per day  Substance Use Topics  . Alcohol use: Yes    Alcohol/week: 0.6 - 1.2 oz    Types: 1 - 2 Glasses of wine per week    Comment: on social occassions  . Drug use: No     Allergies   Patient has no known allergies.   Review of Systems Review of Systems  Unable to perform ROS: Other (yelling angrily at staff refusing to answer questions stating check the records)  Constitutional: Negative for fever.  HENT: Positive for congestion and rhinorrhea.   Eyes: Negative for visual disturbance.  Respiratory: Positive for shortness of breath.   Cardiovascular: Negative for palpitations and leg swelling.  Gastrointestinal: Negative for vomiting.  Musculoskeletal: Negative for neck pain and neck stiffness.  Neurological: Negative for speech difficulty and weakness.     Physical Exam Updated Vital Signs BP (!) 172/62   Pulse (!) 49   Temp 97.6 F (36.4 C)   Resp (!) 21   SpO2 100%   Physical Exam  Constitutional: She appears well-developed and well-nourished.  HENT:  Head: Normocephalic and atraumatic.  Right Ear: External ear normal.  Left Ear: External ear normal.  Mouth/Throat: No oropharyngeal exudate.  Eyes: Pupils are equal, round, and reactive to light. Conjunctivae and EOM are normal.  No proptosis  Neck: Normal range of motion. Neck supple.  Cardiovascular: Normal rate, regular rhythm, normal heart sounds and intact distal pulses.  Pulmonary/Chest: Effort normal and breath sounds normal. No stridor. She has no wheezes. She has no rales.  Abdominal: Soft. Bowel sounds are normal. She exhibits no mass. There is no tenderness. There is no rebound and no guarding.    Musculoskeletal: Normal range of motion.  Neurological: She is alert. She displays normal reflexes.  No facial droop no dysarthria.  Will not allow EDP to complete exam   Skin: Skin is warm and dry. Capillary refill takes less than 2 seconds. She is not diaphoretic.  Psychiatric: Her affect is angry. She is aggressive.     ED Treatments / Results  Labs (all labs ordered are listed, but only abnormal results are displayed) Results for orders placed or performed during the hospital encounter  of 06/04/17  CBC with Differential/Platelet  Result Value Ref Range   WBC 13.2 (H) 4.0 - 10.5 K/uL   RBC 5.29 (H) 3.87 - 5.11 MIL/uL   Hemoglobin 14.6 12.0 - 15.0 g/dL   HCT 41.9 36.0 - 46.0 %   MCV 79.2 78.0 - 100.0 fL   MCH 27.6 26.0 - 34.0 pg   MCHC 34.8 30.0 - 36.0 g/dL   RDW 14.1 11.5 - 15.5 %   Platelets 262 150 - 400 K/uL   Neutrophils Relative % 68 %   Neutro Abs 9.0 (H) 1.7 - 7.7 K/uL   Lymphocytes Relative 23 %   Lymphs Abs 3.0 0.7 - 4.0 K/uL   Monocytes Relative 7 %   Monocytes Absolute 1.0 0.1 - 1.0 K/uL   Eosinophils Relative 2 %   Eosinophils Absolute 0.2 0.0 - 0.7 K/uL   Basophils Relative 0 %   Basophils Absolute 0.0 0.0 - 0.1 K/uL  Basic metabolic panel  Result Value Ref Range   Sodium 136 135 - 145 mmol/L   Potassium 3.4 (L) 3.5 - 5.1 mmol/L   Chloride 106 101 - 111 mmol/L   CO2 19 (L) 22 - 32 mmol/L   Glucose, Bld 141 (H) 65 - 99 mg/dL   BUN 24 (H) 6 - 20 mg/dL   Creatinine, Ser 2.19 (H) 0.44 - 1.00 mg/dL   Calcium 9.2 8.9 - 10.3 mg/dL   GFR calc non Af Amer 23 (L) >60 mL/min   GFR calc Af Amer 27 (L) >60 mL/min   Anion gap 11 5 - 15  Troponin I  Result Value Ref Range   Troponin I 0.03 (HH) <0.03 ng/mL  Brain natriuretic peptide  Result Value Ref Range   B Natriuretic Peptide 273.1 (H) 0.0 - 100.0 pg/mL  Troponin I  Result Value Ref Range   Troponin I 0.03 (HH) <0.03 ng/mL   Dg Chest 2 View  Result Date: 06/04/2017 CLINICAL DATA:  57 y/o  F; shortness  of breath. EXAM: CHEST - 2 VIEW COMPARISON:  01/04/2017 chest radiograph. FINDINGS: Stable cardiac silhouette within normal limits given projection and technique. Clear lungs. No pleural effusion or pneumothorax. No acute osseous abnormality is evident. IMPRESSION: No acute pulmonary process identified. Electronically Signed   By: Kristine Garbe M.D.   On: 06/04/2017 03:17    EKG EKG Interpretation  Date/Time:  Friday Jun 04 2017 02:36:12 EDT Ventricular Rate:  81 PR Interval:    QRS Duration: 103 QT Interval:  401 QTC Calculation: 466 R Axis:   76 Text Interpretation:  Sinus rhythm Repol abnrm old Baseline wander in lead(s) II Confirmed by Randal Buba, Tyshaun Vinzant ([id number]) on 06/04/2017 2:39:52 AM   Radiology Dg Chest 2 View  Result Date: 06/04/2017 CLINICAL DATA:  57 y/o  F; shortness of breath. EXAM: CHEST - 2 VIEW COMPARISON:  01/04/2017 chest radiograph. FINDINGS: Stable cardiac silhouette within normal limits given projection and technique. Clear lungs. No pleural effusion or pneumothorax. No acute osseous abnormality is evident. IMPRESSION: No acute pulmonary process identified. Electronically Signed   By: Kristine Garbe M.D.   On: 06/04/2017 03:17    Procedures Procedures (including critical care time)  Medications Ordered in ED Medications  oxymetazoline (AFRIN) 0.05 % nasal spray 1 spray (1 spray Each Nare Not Given 06/04/17 0432)  acetaminophen (TYLENOL) tablet 1,000 mg (1,000 mg Oral Given 06/04/17 0432)  amLODipine (NORVASC) tablet 10 mg (10 mg Oral Given 06/04/17 0434)    In room with tech, nurse and charge  and patient yells at staff and refuses to answer questions or give history.  Refuses further exam.  Moans and states she is cold and we gave her the headache by asking questions.    Was with charge Orlena Sheldon)  and security at D/c I was polite and but stated we cannot help her without further input from her or compliance with plan.  EDP explained at length  that we had ruled out MI, CHF PNA.  No wheezes but I cannot rule out intracranial lesions or bleed without CT head.  She again refuses.  She understands this and says "what so Im up for discharge."  EDP explained without further history and tests I cannot help her.   Final Clinical Impressions(s) / ED Diagnoses   Final diagnoses:  Rhinorrhea  Nasal congestion   Patient refused her CT scan.  She has refused additional exam.  Ruled out for PNA, MI, CHF in the ED.  BP markedly improved post medication.  Return for weakness, numbness, changes in vision or speech, fevers >100.4 unrelieved by medication, shortness of breath, intractable vomiting, or diarrhea, abdominal pain, Inability to tolerate liquids or food, cough, altered mental status or any concerns. No signs of systemic illness or infection. The patient is nontoxic-appearing on exam and vital signs are within normal limits.   I have reviewed the triage vital signs and the nursing notes. Pertinent labs &imaging results that were available during my care of the patient were reviewed by me and considered in my medical decision making (see chart for details).  After history, exam, and medical workup I feel the patient has been appropriately medically screened and is safe for discharge home. Pertinent diagnoses were discussed with the patient. Patient was given return precautions.   ED Discharge Orders        Ordered    fluticasone (FLONASE) 50 MCG/ACT nasal spray  Daily     06/04/17 0622       Melissia Lahman, MD 06/04/17 [phone]

## 2017-06-04 NOTE — ED Notes (Signed)
Troponin level 0.03 results given to MD and RN

## 2017-06-04 NOTE — ED Notes (Signed)
Assisted patient to bedside commode; no acute distress noted.

## 2017-06-04 NOTE — ED Triage Notes (Addendum)
Pt not speaking in triage-getting aggravated in triage because she 'cannot breath'. Pt raising her voice, no difficulty with speaking when she does speak.  Pt not answering questions when asked. Pt noted to have driven to the ED. Yelling that she is 'going to go to a different emergency room'.  Attempting to calm pt down and obtain information from her.  Will obtain labs and EKG once in room if pt is cooperative. Noted to have nasal congestion and drainage.

## 2017-06-04 NOTE — ED Notes (Signed)
In room to talk with patient. Pt yelling at Dr Randal Buba. Dr wanting to talk to patient about why she is here and her medical history . Pt refusing to answer questions. PT continues to yell at this RN and MD.

## 2017-06-04 NOTE — Telephone Encounter (Signed)
Patient and pharmacy called asking for a refili gabapentin plavix please send top the [facility] [address] in [place]

## 2017-06-08 NOTE — Telephone Encounter (Signed)
Done on 5/31.

## 2017-06-09 ENCOUNTER — Other Ambulatory Visit: Payer: Self-pay

## 2017-06-09 ENCOUNTER — Ambulatory Visit (HOSPITAL_COMMUNITY)
Admission: RE | Admit: 2017-06-09 | Discharge: 2017-06-09 | Disposition: A | Discharge status: Home / Self Care | Payer: Medicaid Other | Source: Ambulatory Visit | Attending: Vascular Surgery | Admitting: Vascular Surgery

## 2017-06-09 ENCOUNTER — Ambulatory Visit (INDEPENDENT_AMBULATORY_CARE_PROVIDER_SITE_OTHER): Payer: Self-pay | Admitting: Vascular Surgery

## 2017-06-09 ENCOUNTER — Ambulatory Visit (INDEPENDENT_AMBULATORY_CARE_PROVIDER_SITE_OTHER)
Admission: RE | Admit: 2017-06-09 | Discharge: 2017-06-09 | Disposition: A | Discharge status: Home / Self Care | Payer: Medicaid Other | Source: Ambulatory Visit | Attending: Vascular Surgery | Admitting: Vascular Surgery

## 2017-06-09 ENCOUNTER — Encounter: Payer: Self-pay | Admitting: Vascular Surgery

## 2017-06-09 VITALS — BP 156/82 | HR 69 | Temp 98.0°F | Resp 16 | Ht 64.0 in | Wt 194.0 lb

## 2017-06-09 DIAGNOSIS — Z89432 Acquired absence of left foot: Secondary | ICD-10-CM | POA: Diagnosis not present

## 2017-06-09 DIAGNOSIS — I10 Essential (primary) hypertension: Secondary | ICD-10-CM | POA: Diagnosis not present

## 2017-06-09 DIAGNOSIS — I70219 Atherosclerosis of native arteries of extremities with intermittent claudication, unspecified extremity: Secondary | ICD-10-CM

## 2017-06-09 DIAGNOSIS — I70202 Unspecified atherosclerosis of native arteries of extremities, left leg: Secondary | ICD-10-CM | POA: Diagnosis not present

## 2017-06-09 DIAGNOSIS — E1159 Type 2 diabetes mellitus with other circulatory complications: Secondary | ICD-10-CM | POA: Diagnosis not present

## 2017-06-09 DIAGNOSIS — I2510 Atherosclerotic heart disease of native coronary artery without angina pectoris: Secondary | ICD-10-CM | POA: Diagnosis not present

## 2017-06-09 NOTE — Progress Notes (Signed)
Patient name: Rebekah Johnson MRN: [id number] DOB: 01/10/1960 Sex: female  REASON FOR VISIT:   Follow-up of peripheral vascular disease.  HPI:   Rebekah Johnson is a pleasant 57 y.o. female who I last saw on 12/09/2016.  Patient had originally presented with a left foot wound.  Back then she underwent successful angioplasty of an occluded distal superficial femoral artery and popliteal artery by Dr. Ruta Hinds.  She had a transmetatarsal amputation by Dr. [name] in August 2017.  At the time of her last visit her stent was patent.  She did have some stenosis in her left common femoral artery but was asymptomatic.  She comes in for a six-month follow-up visit.  Since I saw her last, she does describe some burning pain in both legs but has a hard time describing exactly where the pain is.  Her symptoms are brought on by ambulation.  She also gets the pain at rest sometimes however.  I do not get any clear-cut history of claudication although I think her activity is somewhat limited.  She denies any history of rest pain.  She denies any history of nonhealing ulcers.  She is trying to cut back on smoking and is down to about 5 cigarettes a day.  Past Medical History:  Diagnosis Date  . CAD (coronary artery disease)    a. NSTEM 09/2016: cath showing severe diffuse disease of the RCA, ramus and Cx with mild-mod disease of LAD; PCI would require multiple stents and significant contrast usage thus medical therapy recommended.  . Chronic diastolic CHF (congestive heart failure) ([place])   . CKD (chronic kidney disease), stage III ([place])   . Diabetes mellitus with neuropathy ([place])   . Foot amputation status ([place])    a. h/o left foot transmetatarsal amputation  . High cholesterol   . Hypertension    a. patent renal arteries by PV angio 08/2015. b. heavy proteinuria 09/2016 ? nephrotic.  [name] Normocytic anemia   . Obesity   . Proteinuria   . PVD (peripheral vascular disease) ([place])    a. status post  left SFA and popliteal stent and left SFA PTA with drug coated balloon in 08/2015.  . Tobacco abuse     Family History  Problem Relation Age of Onset  . Diabetes Mother   . Hypertension Father     SOCIAL HISTORY: Social History   Tobacco Use  . Smoking status: Former Smoker    Packs/day: 0.00    Types: Cigarettes    Last attempt to quit: 01/13/2017    Years since quitting: 0.4  . Smokeless tobacco: Never Used  . Tobacco comment: Down to 3 cigarettes per day  Substance Use Topics  . Alcohol use: Yes    Alcohol/week: 0.6 - 1.2 oz    Types: 1 - 2 Glasses of wine per week    Comment: on social occassions    No Known Allergies  Current Outpatient Medications  Medication Sig Dispense Refill  . amLODipine (NORVASC) 10 MG tablet Take 1 tablet (10 mg total) by mouth daily. Reported on 07/13/2015 90 tablet 2  . aspirin 81 MG EC tablet Take 1 tablet (81 mg total) by mouth daily. Reported on 07/13/2015 90 tablet 0  . atorvastatin (LIPITOR) 80 MG tablet Take 1 tablet (80 mg total) by mouth daily. 90 tablet 2  . blood glucose meter kit and supplies KIT Dispense based on patient and insurance preference. Use up to four times daily as directed. (FOR ICD-9 250.00, 250.01).  1 each 3  . Blood Glucose Monitoring Suppl (ACCU-CHEK AVIVA) device Use as instructed three times daily. 1 each 0  . Brimonidine Tartrate (ALPHAGAN P OP) Apply to eye. 1 drop in right eye TID    . carvedilol (COREG) 25 MG tablet Take 1 tablet (25 mg total) by mouth 2 (two) times daily. 180 tablet 2  . Cholecalciferol (VITAMIN D-3) 1000 units CAPS Take 1,000 Units by mouth daily.     . clopidogrel (PLAVIX) 75 MG tablet Take 1 tablet (75 mg total) by mouth daily. 30 tablet 3  . clopidogrel (PLAVIX) 75 MG tablet TAKE 1 TABLET BY MOUTH EVERY DAY 30 tablet 3  . docusate sodium (COLACE) 100 MG capsule TAKE ONE CAPSULE BY MOUTH TWICE A DAY 60 capsule 0  . dorzolamide-timolol (COSOPT) 22.3-6.8 MG/ML ophthalmic solution Place 1 drop into  the right eye 3 (three) times daily. 10 mL 0  . fluticasone (FLONASE) 50 MCG/ACT nasal spray Place 2 sprays into both nostrils daily. 16 g 0  . furosemide (LASIX) 40 MG tablet Take 40 mg by mouth twice daily x 3 days only, then go back to taking 40 mg by mouth daily thereafter. (Patient taking differently: Take 40 mg by mouth 2 (two) times daily. Take 40 mg by mouth twice daily x 3 days only, then go back to taking 40 mg by mouth daily thereafter.) 37 tablet 0  . gabapentin (NEURONTIN) 300 MG capsule Take 1 capsule (300 mg total) by mouth 3 (three) times daily. 90 capsule 3  . gabapentin (NEURONTIN) 300 MG capsule TAKE 2 CAPSULES (600 MG TOTAL) BY MOUTH 3 (THREE) TIMES DAILY AS NEEDED (NEUROPATHIC PAIN). 180 capsule 3  . glipiZIDE (GLUCOTROL) 10 MG tablet Take 1 tablet (10 mg total) by mouth 2 (two) times daily before a meal. 60 tablet 3  . glucose blood (ACCU-CHEK AVIVA) test strip Use as instructed three times daily before meals. 100 each 12  . hydrALAZINE (APRESOLINE) 100 MG tablet Take 1 tablet (100 mg total) by mouth 2 (two) times daily. 180 tablet 2  . isosorbide mononitrate (IMDUR) 30 MG 24 hr tablet Take 3 tablets (90 mg total) by mouth daily. 270 tablet 1  . Lancet Devices (ACCU-CHEK SOFTCLIX) lancets Use as instructed three times daily before meals. 1 each 5  . latanoprost (XALATAN) 0.005 % ophthalmic solution 1 drop at bedtime.    . Omega-3 Fatty Acids (FISH OIL) 1000 MG CAPS Take 1,000 mg by mouth daily. Reported on 07/13/2015    . sitaGLIPtin (JANUVIA) 50 MG tablet Take 1 tablet (50 mg total) by mouth daily. 30 tablet 3  . nitroGLYCERIN (NITROSTAT) 0.4 MG SL tablet Place 1 tablet (0.4 mg total) under the tongue every 5 (five) minutes as needed for chest pain. 25 tablet 3   No current facility-administered medications for this visit.     REVIEW OF SYSTEMS:  '[X]'  denotes positive finding, '[ ]'  denotes negative finding Cardiac  Comments:  Chest pain or chest pressure:    Shortness of  breath upon exertion: x   Short of breath when lying flat: x   Irregular heart rhythm:        Vascular    Pain in calf, thigh, or hip brought on by ambulation:    Pain in feet at night that wakes you up from your sleep:     Blood clot in your veins:    Leg swelling:         Pulmonary    Oxygen at  home:    Productive cough:     Wheezing:         Neurologic    Sudden weakness in arms or legs:     Sudden numbness in arms or legs:     Sudden onset of difficulty speaking or slurred speech:    Temporary loss of vision in one eye:     Problems with dizziness:         Gastrointestinal    Blood in stool:     Vomited blood:         Genitourinary    Burning when urinating:     Blood in urine:        Psychiatric    Major depression:         Hematologic    Bleeding problems:    Problems with blood clotting too easily:        Skin    Rashes or ulcers:        Constitutional    Fever or chills:     PHYSICAL EXAM:   Vitals:   06/09/17 1125  BP: (!) 156/82  Pulse: 69  Resp: 16  Temp: 98 F (36.7 C)  TempSrc: Oral  SpO2: 99%  Weight: 194 lb (88 kg)  Height: '5\' 4"'  (1.626 m)    GENERAL: The patient is a well-nourished female, in no acute distress. The vital signs are documented above. CARDIAC: There is a regular rate and rhythm.  VASCULAR: I do not detect carotid bruits. She has a normal right femoral pulse with a slightly diminished left femoral pulse. I cannot palpate pedal pulses. Both feet are adequately perfused. She has no significant lower extremity swelling. PULMONARY: There is good air exchange bilaterally without wheezing or rales. ABDOMEN: Soft and non-tender with normal pitched bowel sounds.  MUSCULOSKELETAL: There are no major deformities or cyanosis. NEUROLOGIC: No focal weakness or paresthesias are detected. SKIN: She has a transmetatarsal amputation on the left. PSYCHIATRIC: The patient has a normal affect.  DATA:    ARTERIAL DOPPLER STUDY: I have  independently interpreted her arterial Doppler study today.  On the left side there is a monophasic dorsalis pedis and posterior tibial signal.  ABI is 45% which is down from 69% previously.  On the right side she has a monophasic dorsalis pedis and posterior tibial signal.  ABIs 35% which is down from 67% previously.  Toe pressure on the right is 52 mmHg.  ARTERIAL DUPLEX: I have independently interpreted her arterial duplex scan of the left lower extremity.  She does have a moderate stenosis in the distal common femoral artery and a more significant stenosis in the proximal superficial femoral artery.  Her stent is patent.  MEDICAL ISSUES:   PERIPHERAL VASCULAR DISEASE: The patient's symptoms are stable and therefore I would not recommend an aggressive approach to her proximal superficial femoral artery stenosis on the left.  I am encouraged her to try to get off the cigarettes completely.  I have encouraged her to stay as active as possible.  If she develops significant symptoms on the left than she would need arteriography to further evaluate her proximal disease.  I will see her back in 6 months with follow-up ABIs and a follow-up duplex scan.  She knows to call sooner if she has problems.  Deitra Mayo [facility] [phone]

## 2017-06-18 ENCOUNTER — Other Ambulatory Visit: Payer: Self-pay

## 2017-06-18 DIAGNOSIS — I739 Peripheral vascular disease, unspecified: Secondary | ICD-10-CM

## 2017-07-07 ENCOUNTER — Ambulatory Visit (INDEPENDENT_AMBULATORY_CARE_PROVIDER_SITE_OTHER): Payer: Self-pay

## 2017-07-07 ENCOUNTER — Ambulatory Visit (INDEPENDENT_AMBULATORY_CARE_PROVIDER_SITE_OTHER): Payer: Self-pay | Admitting: Orthopedic Surgery

## 2017-07-07 ENCOUNTER — Encounter (INDEPENDENT_AMBULATORY_CARE_PROVIDER_SITE_OTHER): Payer: Self-pay | Admitting: Family

## 2017-07-07 ENCOUNTER — Telehealth (INDEPENDENT_AMBULATORY_CARE_PROVIDER_SITE_OTHER): Payer: Self-pay

## 2017-07-07 DIAGNOSIS — Z89432 Acquired absence of left foot: Secondary | ICD-10-CM

## 2017-07-07 DIAGNOSIS — L97421 Non-pressure chronic ulcer of left heel and midfoot limited to breakdown of skin: Secondary | ICD-10-CM

## 2017-07-07 DIAGNOSIS — M79672 Pain in left foot: Secondary | ICD-10-CM

## 2017-07-07 NOTE — Telephone Encounter (Signed)
Patient called stating that her left foot has a little bit of pus and the bottom of her foot is tender and hurts to walk.  Stated that its been like this for 2 days.  Next appt.is 08/19/2017.  Patient would like to know what can be done or what she needs to do?  CB# is [phone].  Please advise.  Thank You.

## 2017-07-07 NOTE — Progress Notes (Signed)
Office Visit Note   Patient: Rebekah Johnson           Date of Birth: August 04, 1960           MRN: [id number] Visit Date: 07/07/2017              Requested by: Charlott Rakes, MD [place], [place] [id number] PCP: Charlott Rakes, MD  Chief Complaint  Patient presents with  . Left Foot - Pain      HPI: Patient is a 57 year old woman who is seen for evaluation of a draining ulcer left foot status post transmetatarsal amputation about 2 years ago.  Patient is currently ambulating in bedroom slippers.  Assessment & Plan: Visit Diagnoses:  1. Pain in left foot   2. Status post transmetatarsal amputation of foot, left ([place])   3. Lower limb ulcer, heel or midfoot, left, limited to breakdown of skin ([place])     Plan: Ulcer was debrided of skin and soft tissue.  She will start dressing changes daily alternating between Silvadene and Iodosorb.  She does have a Darco shoe and she will wear this for ambulation.  Follow-Up Instructions: Return in about 3 weeks (around 07/28/2017).   Ortho Exam  Patient is alert, oriented, no adenopathy, well-dressed, normal affect, normal respiratory effort. Examination patient's foot is warm no ischemic or gangrenous changes.  She has a ulcer with hypertrophic callus over the residual limb second ray.  After informed consent a 10 blade knife was used to debride the skin and soft tissue back to bleeding viable granulation tissue.  The ulcer is 2 cm in diameter and 3 mm deep.  This does not probe to bone or tendon.  There is no abscess or sinus tract.  Imaging: Xr Foot Complete Left  Result Date: 07/07/2017 3 view radiographs of the left foot shows transmetatarsal amputation without signs of abscess or osteomyelitis.  No images are attached to the encounter.  Labs: Lab Results  Component Value Date   HGBA1C 7.8 12/31/2016   HGBA1C 8.8 (H) 09/14/2016   HGBA1C 9.4 09/02/2016   ESRSEDRATE 57 (H) 07/13/2015   CRP 0.7 07/13/2015   LABURIC  5.7 07/13/2015   REPTSTATUS 01/04/2017 FINAL 01/04/2017   CULT (A) 01/01/2017    VIRIDANS STREPTOCOCCUS THE SIGNIFICANCE OF ISOLATING THIS ORGANISM FROM A SINGLE SET OF BLOOD CULTURES WHEN MULTIPLE SETS ARE DRAWN IS UNCERTAIN. PLEASE NOTIFY THE MICROBIOLOGY DEPARTMENT WITHIN ONE WEEK IF SPECIATION AND SENSITIVITIES ARE REQUIRED. Performed at [facility], [place] [address] [place], [place] [address]      Lab Results  Component Value Date   ALBUMIN 3.7 01/01/2017   ALBUMIN 2.2 (L) 09/16/2016   ALBUMIN 3.2 (L) 09/14/2016   LABURIC 5.7 07/13/2015    There is no height or weight on file to calculate BMI.  Orders:  Orders Placed This Encounter  Procedures  . XR Foot Complete Left   No orders of the defined types were placed in this encounter.    Procedures: No procedures performed  Clinical Data: No additional findings.  ROS:  All other systems negative, except as noted in the HPI. Review of Systems  Objective: Vital Signs: There were no vitals taken for this visit.  Specialty Comments:  No specialty comments available.  PMFS History: Patient Active Problem List   Diagnosis Date Noted  . NSTEMI (non-ST elevated myocardial infarction) ([place]) 01/01/2017  . Chest pain 01/01/2017  . Tobacco abuse   . PVD (peripheral vascular disease) ([place])   .  Proteinuria   . Obesity   . Normocytic anemia   . Hypertension   . High cholesterol   . Foot amputation status ([place])   . Diabetes mellitus with neuropathy ([place])   . CKD (chronic kidney disease), stage III ([place])   . Chronic diastolic CHF (congestive heart failure) ([place])   . CAD (coronary artery disease) 10/27/2016  . Chronic kidney disease 09/23/2016  . Acute congestive heart failure ([place])   . Hypertensive heart disease with heart failure ([place])   . Acute diastolic CHF (congestive heart failure) ([place]) 09/14/2016  . Onychomycosis 12/24/2015  . Status post transmetatarsal amputation of foot, left ([place]) 09/06/2015  .  Diabetic foot ulcer ([place]) 07/13/2015  . Cellulitis 07/12/2015  . HLD (hyperlipidemia) 09/02/2012  . Peripheral nerve disease 09/02/2012  . Type 2 diabetes mellitus with peripheral angiopathy ([place]) 09/02/2012  . Hypertriglyceridemia 07/28/2012  . Peripheral vascular disease ([place]) 07/28/2012  . Avitaminosis D 07/28/2012  . Essential (primary) hypertension 06/10/2012  . History of biliary T-tube placement 06/10/2012  . Leg pain 06/10/2012  . Current tobacco use 06/10/2012  . Atrophic vaginitis 06/07/2012  . Type 2 diabetes mellitus ([place]) 06/07/2012   Past Medical History:  Diagnosis Date  . CAD (coronary artery disease)    a. NSTEM 09/2016: cath showing severe diffuse disease of the RCA, ramus and Cx with mild-mod disease of LAD; PCI would require multiple stents and significant contrast usage thus medical therapy recommended.  . Chronic diastolic CHF (congestive heart failure) ([place])   . CKD (chronic kidney disease), stage III ([place])   . Diabetes mellitus with neuropathy ([place])   . Foot amputation status ([place])    a. h/o left foot transmetatarsal amputation  . High cholesterol   . Hypertension    a. patent renal arteries by PV angio 08/2015. b. heavy proteinuria 09/2016 ? nephrotic.  [name] Normocytic anemia   . Obesity   . Proteinuria   . PVD (peripheral vascular disease) ([place])    a. status post left SFA and popliteal stent and left SFA PTA with drug coated balloon in 08/2015.  . Tobacco abuse     Family History  Problem Relation Age of Onset  . Diabetes Mother   . Hypertension Father     Past Surgical History:  Procedure Laterality Date  . ABDOMINAL HYSTERECTOMY    . AMPUTATION Left 09/06/2015   Procedure: LEFT TRANSMETATARSAL AMPUTATTION;  Surgeon: Newt Minion, MD;  Location: [facility];  Service: Orthopedics;  Laterality: Left;  . LEFT HEART CATH AND CORONARY ANGIOGRAPHY N/A 09/17/2016   Procedure: LEFT HEART CATH AND CORONARY ANGIOGRAPHY;  Surgeon: Jettie Booze, MD;  Location: [facility];  Service: Cardiovascular;  Laterality: N/A;  . PERIPHERAL VASCULAR CATHETERIZATION N/A 08/16/2015   Procedure: Abdominal Aortogram;  Surgeon: Elam Dutch, MD;  Location: [facility];  Service: Cardiovascular;  Laterality: N/A;  . PERIPHERAL VASCULAR CATHETERIZATION Bilateral 08/16/2015   Procedure: Lower Extremity Angiography;  Surgeon: Elam Dutch, MD;  Location: [facility];  Service: Cardiovascular;  Laterality: Bilateral;  . PERIPHERAL VASCULAR CATHETERIZATION Left 08/16/2015   Procedure: Peripheral Vascular Intervention;  Surgeon: Elam Dutch, MD;  Location: [facility];  Service: Cardiovascular;  Laterality: Left;  SFA STENT X 2   Social History   Occupational History  . Not on file  Tobacco Use  . Smoking status: Former Smoker    Packs/day: 0.00    Types: Cigarettes    Last attempt to quit:  01/13/2017    Years since quitting: 0.4  . Smokeless tobacco: Never Used  . Tobacco comment: Down to 3 cigarettes per day  Substance and Sexual Activity  . Alcohol use: Yes    Alcohol/week: 0.6 - 1.2 oz    Types: 1 - 2 Glasses of wine per week    Comment: on social occassions  . Drug use: No  . Sexual activity: Not on file

## 2017-07-07 NOTE — Telephone Encounter (Signed)
Talked with patient and advised her of message per [place].  Patient is coming for appointment 07/07/2017.

## 2017-07-07 NOTE — Telephone Encounter (Signed)
Can you call and please advise needs work in today or she needs to go to the ER.

## 2017-07-13 MED ORDER — CLONIDINE HCL 0.1 MG PO TABS
.10 | ORAL_TABLET | ORAL | Status: DC
Start: ? — End: 2017-07-13

## 2017-07-13 MED ORDER — AMLODIPINE BESYLATE 5 MG PO TABS
10.00 | ORAL_TABLET | ORAL | Status: DC
Start: 2017-07-14 — End: 2017-07-13

## 2017-07-13 MED ORDER — HEPARIN SODIUM (PORCINE) 5000 UNIT/ML IJ SOLN
5000.00 | INTRAMUSCULAR | Status: DC
Start: 2017-07-13 — End: 2017-07-13

## 2017-07-13 MED ORDER — ATORVASTATIN CALCIUM 40 MG PO TABS
80.00 | ORAL_TABLET | ORAL | Status: DC
Start: 2017-07-13 — End: 2017-07-13

## 2017-07-13 MED ORDER — INSULIN LISPRO 100 UNIT/ML ~~LOC~~ SOLN
1.00 | SUBCUTANEOUS | Status: DC
Start: 2017-07-13 — End: 2017-07-13

## 2017-07-13 MED ORDER — PREDNISOLONE ACETATE 1 % OP SUSP
1.00 | OPHTHALMIC | Status: DC
Start: 2017-07-13 — End: 2017-07-13

## 2017-07-13 MED ORDER — NITROGLYCERIN 0.4 MG SL SUBL
0.40 | SUBLINGUAL_TABLET | SUBLINGUAL | Status: DC
Start: ? — End: 2017-07-13

## 2017-07-13 MED ORDER — GENERIC EXTERNAL MEDICATION
1.00 | Status: DC
Start: 2017-07-13 — End: 2017-07-13

## 2017-07-13 MED ORDER — ACETAMINOPHEN 325 MG PO TABS
650.00 | ORAL_TABLET | ORAL | Status: DC
Start: ? — End: 2017-07-13

## 2017-07-13 MED ORDER — GENERIC EXTERNAL MEDICATION
1.00 | Status: DC
Start: ? — End: 2017-07-13

## 2017-07-13 MED ORDER — GENERIC EXTERNAL MEDICATION
Status: DC
Start: ? — End: 2017-07-13

## 2017-07-13 MED ORDER — HYDRALAZINE HCL 20 MG/ML IJ SOLN
10.00 | INTRAMUSCULAR | Status: DC
Start: ? — End: 2017-07-13

## 2017-07-13 MED ORDER — SENNOSIDES-DOCUSATE SODIUM 8.6-50 MG PO TABS
1.00 | ORAL_TABLET | ORAL | Status: DC
Start: 2017-07-13 — End: 2017-07-13

## 2017-07-13 MED ORDER — GABAPENTIN 300 MG PO CAPS
600.00 | ORAL_CAPSULE | ORAL | Status: DC
Start: 2017-07-13 — End: 2017-07-13

## 2017-07-13 MED ORDER — BISACODYL 5 MG PO TBEC
10.00 | DELAYED_RELEASE_TABLET | ORAL | Status: DC
Start: ? — End: 2017-07-13

## 2017-07-13 MED ORDER — GENERIC EXTERNAL MEDICATION
2.25 g | Status: DC
Start: 2017-07-13 — End: 2017-07-13

## 2017-07-13 MED ORDER — FUROSEMIDE 40 MG PO TABS
40.00 | ORAL_TABLET | ORAL | Status: DC
Start: 2017-07-13 — End: 2017-07-13

## 2017-07-13 MED ORDER — ALUMINUM-MAGNESIUM-SIMETHICONE 200-200-20 MG/5ML PO SUSP
30.00 | ORAL | Status: DC
Start: ? — End: 2017-07-13

## 2017-07-13 MED ORDER — GENERIC EXTERNAL MEDICATION
90.00 | Status: DC
Start: 2017-07-14 — End: 2017-07-13

## 2017-07-13 MED ORDER — DEXTROSE 50 % IV SOLN
12.00 g | INTRAVENOUS | Status: DC
Start: ? — End: 2017-07-13

## 2017-07-13 MED ORDER — DOCUSATE SODIUM 100 MG PO CAPS
100.00 | ORAL_CAPSULE | ORAL | Status: DC
Start: ? — End: 2017-07-13

## 2017-07-13 MED ORDER — CHOLECALCIFEROL 25 MCG (1000 UT) PO TABS
2000.00 | ORAL_TABLET | ORAL | Status: DC
Start: 2017-07-14 — End: 2017-07-13

## 2017-07-13 MED ORDER — GLUCOSE 40 % PO GEL
15.00 g | ORAL | Status: DC
Start: ? — End: 2017-07-13

## 2017-07-13 MED ORDER — CARVEDILOL 12.5 MG PO TABS
25.00 | ORAL_TABLET | ORAL | Status: DC
Start: 2017-07-13 — End: 2017-07-13

## 2017-07-13 MED ORDER — HYDRALAZINE HCL 50 MG PO TABS
100.00 | ORAL_TABLET | ORAL | Status: DC
Start: 2017-07-13 — End: 2017-07-13

## 2017-07-13 MED ORDER — BENZONATATE 100 MG PO CAPS
100.00 | ORAL_CAPSULE | ORAL | Status: DC
Start: ? — End: 2017-07-13

## 2017-07-13 MED ORDER — ASPIRIN EC 81 MG PO TBEC
81.00 | DELAYED_RELEASE_TABLET | ORAL | Status: DC
Start: 2017-07-14 — End: 2017-07-13

## 2017-07-13 MED ORDER — CLOPIDOGREL BISULFATE 75 MG PO TABS
75.00 | ORAL_TABLET | ORAL | Status: DC
Start: 2017-07-14 — End: 2017-07-13

## 2017-07-17 ENCOUNTER — Other Ambulatory Visit: Payer: Self-pay | Admitting: Family Medicine

## 2017-07-17 DIAGNOSIS — E1149 Type 2 diabetes mellitus with other diabetic neurological complication: Secondary | ICD-10-CM

## 2017-07-20 ENCOUNTER — Inpatient Hospital Stay: Payer: Medicaid Other | Attending: Oncology

## 2017-07-20 DIAGNOSIS — N289 Disorder of kidney and ureter, unspecified: Secondary | ICD-10-CM | POA: Diagnosis not present

## 2017-07-20 DIAGNOSIS — D472 Monoclonal gammopathy: Secondary | ICD-10-CM | POA: Diagnosis present

## 2017-07-20 DIAGNOSIS — D649 Anemia, unspecified: Secondary | ICD-10-CM | POA: Insufficient documentation

## 2017-07-20 DIAGNOSIS — E1121 Type 2 diabetes mellitus with diabetic nephropathy: Secondary | ICD-10-CM | POA: Insufficient documentation

## 2017-07-20 LAB — CBC WITH DIFFERENTIAL (CANCER CENTER ONLY)
Basophils Absolute: 0.1 10*3/uL (ref 0.0–0.1)
Basophils Relative: 1 %
EOS PCT: 3 %
Eosinophils Absolute: 0.2 10*3/uL (ref 0.0–0.5)
HCT: 32.9 % — ABNORMAL LOW (ref 34.8–46.6)
Hemoglobin: 10.8 g/dL — ABNORMAL LOW (ref 11.6–15.9)
LYMPHS ABS: 1.6 10*3/uL (ref 0.9–3.3)
LYMPHS PCT: 19 %
MCH: 27.2 pg (ref 25.1–34.0)
MCHC: 32.9 g/dL (ref 31.5–36.0)
MCV: 82.7 fL (ref 79.5–101.0)
MONO ABS: 0.7 10*3/uL (ref 0.1–0.9)
MONOS PCT: 8 %
Neutro Abs: 5.9 10*3/uL (ref 1.5–6.5)
Neutrophils Relative %: 69 %
PLATELETS: 229 10*3/uL (ref 145–400)
RBC: 3.98 MIL/uL (ref 3.70–5.45)
RDW: 14.9 % — AB (ref 11.2–14.5)
WBC Count: 8.4 10*3/uL (ref 3.9–10.3)

## 2017-07-20 LAB — CMP (CANCER CENTER ONLY)
ALT: 27 U/L (ref 0–44)
ANION GAP: 8 (ref 5–15)
AST: 15 U/L (ref 15–41)
Albumin: 3.3 g/dL — ABNORMAL LOW (ref 3.5–5.0)
Alkaline Phosphatase: 113 U/L (ref 38–126)
BUN: 36 mg/dL — ABNORMAL HIGH (ref 6–20)
CHLORIDE: 106 mmol/L (ref 98–111)
CO2: 23 mmol/L (ref 22–32)
CREATININE: 3.17 mg/dL — AB (ref 0.44–1.00)
Calcium: 9 mg/dL (ref 8.9–10.3)
GFR, EST AFRICAN AMERICAN: 17 mL/min — AB (ref >60)
GFR, EST NON AFRICAN AMERICAN: 15 mL/min — AB (ref >60)
Glucose, Bld: 196 mg/dL — ABNORMAL HIGH (ref 70–99)
Potassium: 3.9 mmol/L (ref 3.5–5.1)
Sodium: 137 mmol/L (ref 135–145)
Total Bilirubin: 0.2 mg/dL — ABNORMAL LOW (ref 0.3–1.2)
Total Protein: 6.8 g/dL (ref 6.5–8.1)

## 2017-07-21 LAB — KAPPA/LAMBDA LIGHT CHAINS
KAPPA, LAMDA LIGHT CHAIN RATIO: 2.43 — AB (ref 0.26–1.65)
Kappa free light chain: 90.6 mg/L — ABNORMAL HIGH (ref 3.3–19.4)
Lambda free light chains: 37.3 mg/L — ABNORMAL HIGH (ref 5.7–26.3)

## 2017-07-21 LAB — MULTIPLE MYELOMA PANEL, SERUM
ALBUMIN SERPL ELPH-MCNC: 2.9 g/dL (ref 2.9–4.4)
ALPHA 1: 0.2 g/dL (ref 0.0–0.4)
Albumin/Glob SerPl: 1 (ref 0.7–1.7)
Alpha2 Glob SerPl Elph-Mcnc: 0.9 g/dL (ref 0.4–1.0)
B-GLOBULIN SERPL ELPH-MCNC: 0.8 g/dL (ref 0.7–1.3)
GLOBULIN, TOTAL: 3.2 g/dL (ref 2.2–3.9)
Gamma Glob SerPl Elph-Mcnc: 1.2 g/dL (ref 0.4–1.8)
IGA: 155 mg/dL (ref 87–352)
IgG (Immunoglobin G), Serum: 1224 mg/dL (ref 700–1600)
IgM (Immunoglobulin M), Srm: 58 mg/dL (ref 26–217)
M PROTEIN SERPL ELPH-MCNC: 0.4 g/dL — AB
Total Protein ELP: 6.1 g/dL (ref 6.0–8.5)

## 2017-07-28 ENCOUNTER — Ambulatory Visit (INDEPENDENT_AMBULATORY_CARE_PROVIDER_SITE_OTHER): Payer: Medicaid Other | Admitting: Orthopedic Surgery

## 2017-07-28 ENCOUNTER — Encounter (INDEPENDENT_AMBULATORY_CARE_PROVIDER_SITE_OTHER): Payer: Self-pay | Admitting: Orthopedic Surgery

## 2017-07-28 ENCOUNTER — Inpatient Hospital Stay (HOSPITAL_BASED_OUTPATIENT_CLINIC_OR_DEPARTMENT_OTHER): Payer: Medicaid Other | Admitting: Oncology

## 2017-07-28 VITALS — BP 156/69 | HR 66 | Temp 98.4°F | Resp 17 | Ht 64.0 in | Wt 207.8 lb

## 2017-07-28 DIAGNOSIS — N289 Disorder of kidney and ureter, unspecified: Secondary | ICD-10-CM | POA: Diagnosis not present

## 2017-07-28 DIAGNOSIS — D472 Monoclonal gammopathy: Secondary | ICD-10-CM

## 2017-07-28 DIAGNOSIS — Z89432 Acquired absence of left foot: Secondary | ICD-10-CM | POA: Diagnosis not present

## 2017-07-28 DIAGNOSIS — E1121 Type 2 diabetes mellitus with diabetic nephropathy: Secondary | ICD-10-CM | POA: Diagnosis not present

## 2017-07-28 DIAGNOSIS — D649 Anemia, unspecified: Secondary | ICD-10-CM

## 2017-07-28 NOTE — Progress Notes (Signed)
Hematology and Oncology Follow Up Visit  Rebekah Johnson [id number] 1960/05/18 57 y.o. 07/28/2017 10:41 AM Rebekah Johnson, MDNewlin, Rebekah Ferretti, MD   Principle Diagnosis: 57 year old woman with IgG kappa monoclonal gammopathy of undetermined significance diagnosed in January 2019.  She was found to have 0.5 g/dL without any endorgan damage.   Current therapy: Active surveillance.  Interim History:  [name] presents today for a follow-up visit.  Since her last visit, she reports no major changes or recent complaints.  She continues to live independently and attends to activities of daily living.  She is able to drive without any decline in ability to do so.  She denies any bone pain or pathological fractures.  She denies any worsening neuropathy or constitutional symptoms.  She denies any recurrent infections.  She does not report any headaches, blurry vision, syncope or seizures. Does not report any fevers, chills or sweats.  Does not report any cough, wheezing or hemoptysis.  Does not report any chest pain, palpitation, orthopnea or leg edema.  Does not report any nausea, vomiting or abdominal pain.  Does not report any constipation or diarrhea.  Does not report any skeletal complaints.    Does not report frequency, urgency or hematuria.  Does not report any skin rashes or lesions.  Does not report any lymphadenopathy or petechiae.  Does not report any anxiety or depression.  Remaining review of systems is negative.    Medications: I have reviewed the patient's current medications.  Current Outpatient Medications  Medication Sig Dispense Refill  . amLODipine (NORVASC) 10 MG tablet Take 1 tablet (10 mg total) by mouth daily. Reported on 07/13/2015 90 tablet 2  . aspirin 81 MG EC tablet Take 1 tablet (81 mg total) by mouth daily. Reported on 07/13/2015 90 tablet 0  . atorvastatin (LIPITOR) 80 MG tablet Take 1 tablet (80 mg total) by mouth daily. 90 tablet 2  . blood glucose meter kit and supplies  KIT Dispense based on patient and insurance preference. Use up to four times daily as directed. (FOR ICD-9 250.00, 250.01). 1 each 3  . Blood Glucose Monitoring Suppl (ACCU-CHEK AVIVA) device Use as instructed three times daily. 1 each 0  . Brimonidine Tartrate (ALPHAGAN P OP) Apply to eye. 1 drop in right eye TID    . carvedilol (COREG) 25 MG tablet Take 1 tablet (25 mg total) by mouth 2 (two) times daily. 180 tablet 2  . Cholecalciferol (VITAMIN D-3) 1000 units CAPS Take 1,000 Units by mouth daily.     . clopidogrel (PLAVIX) 75 MG tablet Take 1 tablet (75 mg total) by mouth daily. 30 tablet 3  . clopidogrel (PLAVIX) 75 MG tablet TAKE 1 TABLET BY MOUTH EVERY DAY 30 tablet 3  . docusate sodium (COLACE) 100 MG capsule TAKE ONE CAPSULE BY MOUTH TWICE A DAY 60 capsule 0  . dorzolamide-timolol (COSOPT) 22.3-6.8 MG/ML ophthalmic solution Place 1 drop into the right eye 3 (three) times daily. 10 mL 0  . fluticasone (FLONASE) 50 MCG/ACT nasal spray Place 2 sprays into both nostrils daily. 16 g 0  . furosemide (LASIX) 40 MG tablet Take 40 mg by mouth twice daily x 3 days only, then go back to taking 40 mg by mouth daily thereafter. (Patient taking differently: Take 40 mg by mouth 2 (two) times daily. Take 40 mg by mouth twice daily x 3 days only, then go back to taking 40 mg by mouth daily thereafter.) 37 tablet 0  . gabapentin (NEURONTIN) 300 MG capsule Take  1 capsule (300 mg total) by mouth 3 (three) times daily. 90 capsule 3  . gabapentin (NEURONTIN) 300 MG capsule TAKE 2 CAPSULES (600 MG TOTAL) BY MOUTH 3 (THREE) TIMES DAILY AS NEEDED (NEUROPATHIC PAIN). 180 capsule 3  . glipiZIDE (GLUCOTROL) 10 MG tablet Take 1 tablet (10 mg total) by mouth 2 (two) times daily before a meal. MUST MAKE APPT FOR FURTHER REFILLS 60 tablet 0  . glucose blood (ACCU-CHEK AVIVA) test strip Use as instructed three times daily before meals. 100 each 12  . hydrALAZINE (APRESOLINE) 100 MG tablet Take 1 tablet (100 mg total) by  mouth 2 (two) times daily. 180 tablet 2  . isosorbide mononitrate (IMDUR) 30 MG 24 hr tablet Take 3 tablets (90 mg total) by mouth daily. 270 tablet 1  . Lancet Devices (ACCU-CHEK SOFTCLIX) lancets Use as instructed three times daily before meals. 1 each 5  . latanoprost (XALATAN) 0.005 % ophthalmic solution 1 drop at bedtime.    . nitroGLYCERIN (NITROSTAT) 0.4 MG SL tablet Place 1 tablet (0.4 mg total) under the tongue every 5 (five) minutes as needed for chest pain. 25 tablet 3  . Omega-3 Fatty Acids (FISH OIL) 1000 MG CAPS Take 1,000 mg by mouth daily. Reported on 07/13/2015    . sitaGLIPtin (JANUVIA) 50 MG tablet Take 1 tablet (50 mg total) by mouth daily. 30 tablet 3   No current facility-administered medications for this visit.      Allergies: No Known Allergies  Past Medical History, Surgical history, Social history, and Family History were reviewed and updated.    Physical Exam: Blood pressure (!) 156/69, pulse 66, temperature 98.4 F (36.9 C), temperature source Oral, resp. rate 17, height '5\' 4"'  (1.626 m), weight 207 lb 12.8 oz (94.3 kg), SpO2 100 %.   ECOG: 1 General appearance: alert and cooperative appeared without distress. Head: Normocephalic, without obvious abnormality Oropharynx: No oral thrush or ulcers. Eyes: No scleral icterus.  Pupils are equal and round reactive to light. Lymph nodes: Cervical, supraclavicular, and axillary nodes normal. Heart:regular rate and rhythm, S1, S2 normal, no murmur, click, rub or gallop Lung:chest clear, no wheezing, rales, normal symmetric air entry Abdomin: soft, non-tender, without masses or organomegaly. Neurological: No motor, sensory deficits.  Intact deep tendon reflexes. Skin: No rashes or lesions.  No ecchymosis or petechiae. Musculoskeletal: No joint deformity or effusion.     Lab Results: Lab Results  Component Value Date   WBC 8.4 07/20/2017   HGB 10.8 (L) 07/20/2017   HCT 32.9 (L) 07/20/2017   MCV 82.7  07/20/2017   PLT 229 07/20/2017     Chemistry      Component Value Date/Time   NA 137 07/20/2017 1057   NA 140 05/28/2017 1147   K 3.9 07/20/2017 1057   CL 106 07/20/2017 1057   CO2 23 07/20/2017 1057   BUN 36 (H) 07/20/2017 1057   BUN 25 (H) 05/28/2017 1147   CREATININE 3.17 (HH) 07/20/2017 1057   CREATININE 1.19 (H) 12/18/2015 1006      Component Value Date/Time   CALCIUM 9.0 07/20/2017 1057   ALKPHOS 113 07/20/2017 1057   AST 15 07/20/2017 1057   ALT 27 07/20/2017 1057   BILITOT 0.2 (L) 07/20/2017 1057      Results for AMANY, RANDO (MRN [id number]) as of 07/28/2017 11:11  Ref. Range 07/20/2017 10:57  M Protein SerPl Elph-Mcnc Latest Ref Range: Not Observed g/dL 0.4 (H)  IFE 1 Unknown Comment  Globulin, Total Latest Ref Range: 2.2 -  3.9 g/dL 3.2  B-Globulin SerPl Elph-Mcnc Latest Ref Range: 0.7 - 1.3 g/dL 0.8  IgG (Immunoglobin G), Serum Latest Ref Range: 700 - 1,600 mg/dL 1,224  IgM (Immunoglobulin M), Srm Latest Ref Range: 26 - 217 mg/dL 58  IgA Latest Ref Range: 87 - 352 mg/dL 155      Impression and Plan:  57 year old woman with the following:  1.  IgG kappa monoclonal gammopathy of undetermined significance diagnosed in January 2019.  At that time her M spike was 0.5 g/dL with a normal IgG level.  She remains on active surveillance at this time.  Serum protein electrophoresis repeated on July 20, 2017 and showed a 0.4 g/dL M spike and normal IgG level.  The remaining laboratory data showed elevated creatinine but normal calcium and electrolytes.  She did have a mild anemia with a hemoglobin of 10.8.  The natural course of these findings and differential diagnosis was reviewed again.  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  See no evidence to suggest endorgan damage and I have recommended observation and surveillance.  Bone marrow biopsy and skeletal survey may be needed if there is abnormalities detected in the future and her protein  studies.  2.  Renal insufficiency: She underwent kidney biopsy in February 2019.  The biopsy showed diabetic nephropathy as well as severe arterionephrosclerosis without any evidence to suggest a light chain myeloma involvement.  Renal dysfunction appears to be related to long-standing hypertension and diabetes.  She continues to follow with nephrology regarding this issue.  3.  Anemia: Likely related to her renal insufficiency rather than plasma cell disorder.  4.  Follow-up: We will be in 9 months to repeat a serum protein electrophoresis with the time.  15  minutes was spent with the patient face-to-face today.  More than 50% of time was dedicated to patient counseling, education and coordination of her care.    Zola Button, MD 7/24/201910:41 AM

## 2017-07-28 NOTE — Progress Notes (Signed)
Office Visit Note   Patient: Rebekah Johnson           Date of Birth: 15-Jan-1960           MRN: [id number] Visit Date: 07/28/2017              Requested by: Charlott Rakes, MD [place], [place] [id number] PCP: Charlott Rakes, MD  Chief Complaint  Patient presents with  . Left Foot - Follow-up, Wound Check      HPI: Patient is a 57 year old woman presents in follow-up status post transmetatarsal amputation of the left.  Patient states that she has an unsteady gait and is at risk of falling.  Assessment & Plan: Visit Diagnoses:  1. Status post transmetatarsal amputation of foot, left ([place])     Plan: Patient was given a prescription for biotech for extra-depth shoes custom orthotic spacer carbon plate and double upright braces.  Follow-Up Instructions: Return in about 3 months (around 10/28/2017).   Ortho Exam  Patient is alert, oriented, no adenopathy, well-dressed, normal affect, normal respiratory effort. Examination patient has good dorsiflexion of the ankle.  There are no plantar ulcers no venous stasis ulcers no cellulitis no signs of infection.  Her foot is plantigrade.  Patient does have an unsteady gait  Imaging: No results found. No images are attached to the encounter.  Labs: Lab Results  Component Value Date   HGBA1C 7.8 12/31/2016   HGBA1C 8.8 (H) 09/14/2016   HGBA1C 9.4 09/02/2016   ESRSEDRATE 57 (H) 07/13/2015   CRP 0.7 07/13/2015   LABURIC 5.7 07/13/2015   REPTSTATUS 01/04/2017 FINAL 01/04/2017   CULT (A) 01/01/2017    VIRIDANS STREPTOCOCCUS THE SIGNIFICANCE OF ISOLATING THIS ORGANISM FROM A SINGLE SET OF BLOOD CULTURES WHEN MULTIPLE SETS ARE DRAWN IS UNCERTAIN. PLEASE NOTIFY THE MICROBIOLOGY DEPARTMENT WITHIN ONE WEEK IF SPECIATION AND SENSITIVITIES ARE REQUIRED. Performed at [facility], [place] [address] [place], [place] [address]      Lab Results  Component Value Date   ALBUMIN 3.3 (L) 07/20/2017   ALBUMIN 3.7  01/01/2017   ALBUMIN 2.2 (L) 09/16/2016   LABURIC 5.7 07/13/2015    There is no height or weight on file to calculate BMI.  Orders:  No orders of the defined types were placed in this encounter.  No orders of the defined types were placed in this encounter.    Procedures: No procedures performed  Clinical Data: No additional findings.  ROS:  All other systems negative, except as noted in the HPI. Review of Systems  Objective: Vital Signs: There were no vitals taken for this visit.  Specialty Comments:  No specialty comments available.  PMFS History: Patient Active Problem List   Diagnosis Date Noted  . NSTEMI (non-ST elevated myocardial infarction) ([place]) 01/01/2017  . Chest pain 01/01/2017  . Tobacco abuse   . PVD (peripheral vascular disease) ([place])   . Proteinuria   . Obesity   . Normocytic anemia   . Hypertension   . High cholesterol   . Foot amputation status ([place])   . Diabetes mellitus with neuropathy ([place])   . CKD (chronic kidney disease), stage III ([place])   . Chronic diastolic CHF (congestive heart failure) ([place])   . CAD (coronary artery disease) 10/27/2016  . Chronic kidney disease 09/23/2016  . Acute congestive heart failure ([place])   . Hypertensive heart disease with heart failure ([place])   . Acute diastolic CHF (congestive heart failure) ([place]) 09/14/2016  . Onychomycosis 12/24/2015  .  Status post transmetatarsal amputation of foot, left ([place]) 09/06/2015  . Diabetic foot ulcer ([place]) 07/13/2015  . Cellulitis 07/12/2015  . HLD (hyperlipidemia) 09/02/2012  . Peripheral nerve disease 09/02/2012  . Type 2 diabetes mellitus with peripheral angiopathy ([place]) 09/02/2012  . Hypertriglyceridemia 07/28/2012  . Peripheral vascular disease ([place]) 07/28/2012  . Avitaminosis D 07/28/2012  . Essential (primary) hypertension 06/10/2012  . History of biliary T-tube placement 06/10/2012  . Leg pain 06/10/2012  . Current tobacco use 06/10/2012  . Atrophic vaginitis  06/07/2012  . Type 2 diabetes mellitus ([place]) 06/07/2012   Past Medical History:  Diagnosis Date  . CAD (coronary artery disease)    a. NSTEM 09/2016: cath showing severe diffuse disease of the RCA, ramus and Cx with mild-mod disease of LAD; PCI would require multiple stents and significant contrast usage thus medical therapy recommended.  . Chronic diastolic CHF (congestive heart failure) ([place])   . CKD (chronic kidney disease), stage III ([place])   . Diabetes mellitus with neuropathy ([place])   . Foot amputation status ([place])    a. h/o left foot transmetatarsal amputation  . High cholesterol   . Hypertension    a. patent renal arteries by PV angio 08/2015. b. heavy proteinuria 09/2016 ? nephrotic.  [name] Normocytic anemia   . Obesity   . Proteinuria   . PVD (peripheral vascular disease) ([place])    a. status post left SFA and popliteal stent and left SFA PTA with drug coated balloon in 08/2015.  . Tobacco abuse     Family History  Problem Relation Age of Onset  . Diabetes Mother   . Hypertension Father     Past Surgical History:  Procedure Laterality Date  . ABDOMINAL HYSTERECTOMY    . AMPUTATION Left 09/06/2015   Procedure: LEFT TRANSMETATARSAL AMPUTATTION;  Surgeon: Newt Minion, MD;  Location: [facility];  Service: Orthopedics;  Laterality: Left;  . LEFT HEART CATH AND CORONARY ANGIOGRAPHY N/A 09/17/2016   Procedure: LEFT HEART CATH AND CORONARY ANGIOGRAPHY;  Surgeon: Jettie Booze, MD;  Location: [facility];  Service: Cardiovascular;  Laterality: N/A;  . PERIPHERAL VASCULAR CATHETERIZATION N/A 08/16/2015   Procedure: Abdominal Aortogram;  Surgeon: Elam Dutch, MD;  Location: [facility];  Service: Cardiovascular;  Laterality: N/A;  . PERIPHERAL VASCULAR CATHETERIZATION Bilateral 08/16/2015   Procedure: Lower Extremity Angiography;  Surgeon: Elam Dutch, MD;  Location: [facility];  Service: Cardiovascular;  Laterality: Bilateral;  . PERIPHERAL VASCULAR  CATHETERIZATION Left 08/16/2015   Procedure: Peripheral Vascular Intervention;  Surgeon: Elam Dutch, MD;  Location: [facility];  Service: Cardiovascular;  Laterality: Left;  SFA STENT X 2   Social History   Occupational History  . Not on file  Tobacco Use  . Smoking status: Former Smoker    Packs/day: 0.00    Types: Cigarettes    Last attempt to quit: 01/13/2017    Years since quitting: 0.5  . Smokeless tobacco: Never Used  . Tobacco comment: Down to 3 cigarettes per day  Substance and Sexual Activity  . Alcohol use: Yes    Alcohol/week: 0.6 - 1.2 oz    Types: 1 - 2 Glasses of wine per week    Comment: on social occassions  . Drug use: No  . Sexual activity: Not on file

## 2017-08-05 ENCOUNTER — Ambulatory Visit: Payer: Medicaid Other | Attending: Family Medicine | Admitting: Family Medicine

## 2017-08-05 ENCOUNTER — Encounter: Payer: Self-pay | Admitting: Family Medicine

## 2017-08-05 VITALS — BP 149/73 | HR 62 | Temp 98.2°F | Ht 64.0 in | Wt 205.0 lb

## 2017-08-05 DIAGNOSIS — I5032 Chronic diastolic (congestive) heart failure: Secondary | ICD-10-CM | POA: Insufficient documentation

## 2017-08-05 DIAGNOSIS — E1121 Type 2 diabetes mellitus with diabetic nephropathy: Secondary | ICD-10-CM | POA: Insufficient documentation

## 2017-08-05 DIAGNOSIS — E669 Obesity, unspecified: Secondary | ICD-10-CM | POA: Insufficient documentation

## 2017-08-05 DIAGNOSIS — I2510 Atherosclerotic heart disease of native coronary artery without angina pectoris: Secondary | ICD-10-CM | POA: Diagnosis not present

## 2017-08-05 DIAGNOSIS — Z7982 Long term (current) use of aspirin: Secondary | ICD-10-CM | POA: Diagnosis not present

## 2017-08-05 DIAGNOSIS — Z89432 Acquired absence of left foot: Secondary | ICD-10-CM | POA: Diagnosis not present

## 2017-08-05 DIAGNOSIS — I130 Hypertensive heart and chronic kidney disease with heart failure and stage 1 through stage 4 chronic kidney disease, or unspecified chronic kidney disease: Secondary | ICD-10-CM | POA: Diagnosis not present

## 2017-08-05 DIAGNOSIS — N184 Chronic kidney disease, stage 4 (severe): Secondary | ICD-10-CM | POA: Insufficient documentation

## 2017-08-05 DIAGNOSIS — E1122 Type 2 diabetes mellitus with diabetic chronic kidney disease: Secondary | ICD-10-CM | POA: Diagnosis not present

## 2017-08-05 DIAGNOSIS — Z9889 Other specified postprocedural states: Secondary | ICD-10-CM | POA: Diagnosis not present

## 2017-08-05 DIAGNOSIS — E1149 Type 2 diabetes mellitus with other diabetic neurological complication: Secondary | ICD-10-CM

## 2017-08-05 DIAGNOSIS — Z7902 Long term (current) use of antithrombotics/antiplatelets: Secondary | ICD-10-CM | POA: Insufficient documentation

## 2017-08-05 DIAGNOSIS — Z79899 Other long term (current) drug therapy: Secondary | ICD-10-CM | POA: Diagnosis not present

## 2017-08-05 DIAGNOSIS — I739 Peripheral vascular disease, unspecified: Secondary | ICD-10-CM | POA: Diagnosis not present

## 2017-08-05 DIAGNOSIS — I10 Essential (primary) hypertension: Secondary | ICD-10-CM

## 2017-08-05 DIAGNOSIS — E1151 Type 2 diabetes mellitus with diabetic peripheral angiopathy without gangrene: Secondary | ICD-10-CM | POA: Diagnosis not present

## 2017-08-05 DIAGNOSIS — E1142 Type 2 diabetes mellitus with diabetic polyneuropathy: Secondary | ICD-10-CM | POA: Insufficient documentation

## 2017-08-05 DIAGNOSIS — E7800 Pure hypercholesterolemia, unspecified: Secondary | ICD-10-CM | POA: Diagnosis not present

## 2017-08-05 DIAGNOSIS — G4739 Other sleep apnea: Secondary | ICD-10-CM | POA: Insufficient documentation

## 2017-08-05 LAB — POCT GLYCOSYLATED HEMOGLOBIN (HGB A1C): HBA1C, POC (CONTROLLED DIABETIC RANGE): 7.4 % — AB (ref 0.0–7.0)

## 2017-08-05 LAB — GLUCOSE, POCT (MANUAL RESULT ENTRY): POC GLUCOSE: 150 mg/dL — AB (ref 70–99)

## 2017-08-05 MED ORDER — GLIPIZIDE 10 MG PO TABS: 10.0000 mg | ORAL_TABLET | Freq: Two times a day (BID) | ORAL | 1 refills | Status: DC

## 2017-08-05 MED ORDER — GABAPENTIN 300 MG PO CAPS: 300.0000 mg | ORAL_CAPSULE | Freq: Three times a day (TID) | ORAL | 3 refills | Status: DC

## 2017-08-05 MED ORDER — CLOPIDOGREL BISULFATE 75 MG PO TABS: 75.0000 mg | ORAL_TABLET | Freq: Every day | ORAL | 1 refills | Status: DC

## 2017-08-05 MED ORDER — SITAGLIPTIN PHOSPHATE 25 MG PO TABS: 25.0000 mg | ORAL_TABLET | Freq: Every day | ORAL | 3 refills | Status: DC

## 2017-08-05 NOTE — Patient Instructions (Signed)
Diabetes Mellitus and Nutrition When you have diabetes (diabetes mellitus), it is very important to have healthy eating habits because your blood sugar (glucose) levels are greatly affected by what you eat and drink. Eating healthy foods in the appropriate amounts, at about the same times every day, can help you:  Control your blood glucose.  Lower your risk of heart disease.  Improve your blood pressure.  Reach or maintain a healthy weight.  Every person with diabetes is different, and each person has different needs for a meal plan. Your health care provider may recommend that you work with a diet and nutrition specialist (dietitian) to make a meal plan that is best for you. Your meal plan may vary depending on factors such as:  The calories you need.  The medicines you take.  Your weight.  Your blood glucose, blood pressure, and cholesterol levels.  Your activity level.  Other health conditions you have, such as heart or kidney disease.  How do carbohydrates affect me? Carbohydrates affect your blood glucose level more than any other type of food. Eating carbohydrates naturally increases the amount of glucose in your blood. Carbohydrate counting is a method for keeping track of how many carbohydrates you eat. Counting carbohydrates is important to keep your blood glucose at a healthy level, especially if you use insulin or take certain oral diabetes medicines. It is important to know how many carbohydrates you can safely have in each meal. This is different for every person. Your dietitian can help you calculate how many carbohydrates you should have at each meal and for snack. Foods that contain carbohydrates include:  Bread, cereal, rice, pasta, and crackers.  Potatoes and corn.  Peas, beans, and lentils.  Milk and yogurt.  Fruit and juice.  Desserts, such as cakes, cookies, ice cream, and candy.  How does alcohol affect me? Alcohol can cause a sudden decrease in blood  glucose (hypoglycemia), especially if you use insulin or take certain oral diabetes medicines. Hypoglycemia can be a life-threatening condition. Symptoms of hypoglycemia (sleepiness, dizziness, and confusion) are similar to symptoms of having too much alcohol. If your health care provider says that alcohol is safe for you, follow these guidelines:  Limit alcohol intake to no more than 1 drink per day for nonpregnant women and 2 drinks per day for men. One drink equals 12 oz of beer, 5 oz of wine, or 1 oz of hard liquor.  Do not drink on an empty stomach.  Keep yourself hydrated with water, diet soda, or unsweetened iced tea.  Keep in mind that regular soda, juice, and other mixers may contain a lot of sugar and must be counted as carbohydrates.  What are tips for following this plan? Reading food labels  Start by checking the serving size on the label. The amount of calories, carbohydrates, fats, and other nutrients listed on the label are based on one serving of the food. Many foods contain more than one serving per package.  Check the total grams (g) of carbohydrates in one serving. You can calculate the number of servings of carbohydrates in one serving by dividing the total carbohydrates by 15. For example, if a food has 30 g of total carbohydrates, it would be equal to 2 servings of carbohydrates.  Check the number of grams (g) of saturated and trans fats in one serving. Choose foods that have low or no amount of these fats.  Check the number of milligrams (mg) of sodium in one serving. Most people   should limit total sodium intake to less than 2,300 mg per day.  Always check the nutrition information of foods labeled as "low-fat" or "nonfat". These foods may be higher in added sugar or refined carbohydrates and should be avoided.  Talk to your dietitian to identify your daily goals for nutrients listed on the label. Shopping  Avoid buying canned, premade, or processed foods. These  foods tend to be high in fat, sodium, and added sugar.  Shop around the outside edge of the grocery store. This includes fresh fruits and vegetables, bulk grains, fresh meats, and fresh dairy. Cooking  Use low-heat cooking methods, such as baking, instead of high-heat cooking methods like deep frying.  Cook using healthy oils, such as olive, canola, or sunflower oil.  Avoid cooking with butter, cream, or high-fat meats. Meal planning  Eat meals and snacks regularly, preferably at the same times every day. Avoid going long periods of time without eating.  Eat foods high in fiber, such as fresh fruits, vegetables, beans, and whole grains. Talk to your dietitian about how many servings of carbohydrates you can eat at each meal.  Eat 4-6 ounces of lean protein each day, such as lean meat, chicken, fish, eggs, or tofu. 1 ounce is equal to 1 ounce of meat, chicken, or fish, 1 egg, or 1/4 cup of tofu.  Eat some foods each day that contain healthy fats, such as avocado, nuts, seeds, and fish. Lifestyle   Check your blood glucose regularly.  Exercise at least 30 minutes 5 or more days each week, or as told by your health care provider.  Take medicines as told by your health care provider.  Do not use any products that contain nicotine or tobacco, such as cigarettes and e-cigarettes. If you need help quitting, ask your health care provider.  Work with a counselor or diabetes educator to identify strategies to manage stress and any emotional and social challenges. What are some questions to ask my health care provider?  Do I need to meet with a diabetes educator?  Do I need to meet with a dietitian?  What number can I call if I have questions?  When are the best times to check my blood glucose? Where to find more information:  American Diabetes Association: diabetes.org/food-and-fitness/food  Academy of Nutrition and Dietetics:  www.eatright.org/resources/health/diseases-and-conditions/diabetes  National Institute of Diabetes and Digestive and Kidney Diseases (NIH): www.niddk.nih.gov/health-information/diabetes/overview/diet-eating-physical-activity Summary  A healthy meal plan will help you control your blood glucose and maintain a healthy lifestyle.  Working with a diet and nutrition specialist (dietitian) can help you make a meal plan that is best for you.  Keep in mind that carbohydrates and alcohol have immediate effects on your blood glucose levels. It is important to count carbohydrates and to use alcohol carefully. This information is not intended to replace advice given to you by your health care provider. Make sure you discuss any questions you have with your health care provider. Document Released: 09/18/2004 Document Revised: 01/27/2016 Document Reviewed: 01/27/2016 Elsevier Interactive Patient Education  2018 [employer]  

## 2017-08-05 NOTE — Progress Notes (Signed)
Subjective:  Patient ID: Rebekah Johnson, female    DOB: Jun 16, 1960  Age: 57 y.o. MRN: [id number]  CC: Diabetes   HPI Rebekah Johnson is a 57 year old female with a history of type 2 diabetes mellitus (A1c 7.4), diabetic neuropathy, peripheral vascular disease (status post left SFA and popliteal artery stent and left SFA PTA with drug-coated balloon in 08/2015), CAD (severe 2 V CAD from cardiac cath 09/2016 - medical management for now), Left foot transmetatarsal amputation, chronic kidney disease stage IV, tobacco abuse, hypertension She was seen by Dr. Sharol Given last week and prescribed orthotics for her shoes due to instability from her transmetatarsal amputation of her left foot.  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 In 05/2017 she was seen by cardiology with no change in management recommended Also closely followed by [facility] for her chronic kidney disease.  She presents today with no chest pains, shortness of breath and has been compliant with her medications.  Denies hypoglycemia on neuropathy is controlled on her current regimen.  She is up-to-date on annual eye exams. Tolerating her antihypertensive and her statin with no complaints of adverse effects.  Past Medical History:  Diagnosis Date  . CAD (coronary artery disease)    a. NSTEM 09/2016: cath showing severe diffuse disease of the RCA, ramus and Cx with mild-mod disease of LAD; PCI would require multiple stents and significant contrast usage thus medical therapy recommended.  . Chronic diastolic CHF (congestive heart failure) ([place])   . CKD (chronic kidney disease), stage III ([place])   . Diabetes mellitus with neuropathy ([place])   . Foot amputation status ([place])    a. h/o left foot transmetatarsal amputation  . High cholesterol   . Hypertension    a. patent renal arteries by PV angio 08/2015. b. heavy proteinuria 09/2016 ? nephrotic.  [name] Normocytic anemia   . Obesity   .  Proteinuria   . PVD (peripheral vascular disease) ([place])    a. status post left SFA and popliteal stent and left SFA PTA with drug coated balloon in 08/2015.  . Tobacco abuse     Past Surgical History:  Procedure Laterality Date  . ABDOMINAL HYSTERECTOMY    . AMPUTATION Left 09/06/2015   Procedure: LEFT TRANSMETATARSAL AMPUTATTION;  Surgeon: Newt Minion, MD;  Location: [facility];  Service: Orthopedics;  Laterality: Left;  . LEFT HEART CATH AND CORONARY ANGIOGRAPHY N/A 09/17/2016   Procedure: LEFT HEART CATH AND CORONARY ANGIOGRAPHY;  Surgeon: Jettie Booze, MD;  Location: [facility];  Service: Cardiovascular;  Laterality: N/A;  . PERIPHERAL VASCULAR CATHETERIZATION N/A 08/16/2015   Procedure: Abdominal Aortogram;  Surgeon: Elam Dutch, MD;  Location: [facility];  Service: Cardiovascular;  Laterality: N/A;  . PERIPHERAL VASCULAR CATHETERIZATION Bilateral 08/16/2015   Procedure: Lower Extremity Angiography;  Surgeon: Elam Dutch, MD;  Location: [facility];  Service: Cardiovascular;  Laterality: Bilateral;  . PERIPHERAL VASCULAR CATHETERIZATION Left 08/16/2015   Procedure: Peripheral Vascular Intervention;  Surgeon: Elam Dutch, MD;  Location: [facility];  Service: Cardiovascular;  Laterality: Left;  SFA STENT X 2    No Known Allergies   Outpatient Medications Prior to Visit  Medication Sig Dispense Refill  . amLODipine (NORVASC) 10 MG tablet Take 1 tablet (10 mg total) by mouth daily. Reported on 07/13/2015 90 tablet 2  . aspirin 81 MG EC tablet Take 1 tablet (81 mg total) by mouth daily. Reported on  07/13/2015 90 tablet 0  . atorvastatin (LIPITOR) 80 MG tablet Take 1 tablet (80 mg total) by mouth daily. 90 tablet 2  . blood glucose meter kit and supplies KIT Dispense based on patient and insurance preference. Use up to four times daily as directed. (FOR ICD-9 250.00, 250.01). 1 each 3  . Blood Glucose Monitoring Suppl (ACCU-CHEK AVIVA) device Use  as instructed three times daily. 1 each 0  . Brimonidine Tartrate (ALPHAGAN P OP) Apply to eye. 1 drop in right eye TID    . carvedilol (COREG) 25 MG tablet Take 1 tablet (25 mg total) by mouth 2 (two) times daily. 180 tablet 2  . Cholecalciferol (VITAMIN D-3) 1000 units CAPS Take 1,000 Units by mouth daily.     [name] docusate sodium (COLACE) 100 MG capsule TAKE ONE CAPSULE BY MOUTH TWICE A DAY 60 capsule 0  . dorzolamide-timolol (COSOPT) 22.3-6.8 MG/ML ophthalmic solution Place 1 drop into the right eye 3 (three) times daily. 10 mL 0  . furosemide (LASIX) 40 MG tablet Take 40 mg by mouth twice daily x 3 days only, then go back to taking 40 mg by mouth daily thereafter. (Patient taking differently: Take 40 mg by mouth 2 (two) times daily. Take 40 mg by mouth twice daily x 3 days only, then go back to taking 40 mg by mouth daily thereafter.) 37 tablet 0  . glucose blood (ACCU-CHEK AVIVA) test strip Use as instructed three times daily before meals. 100 each 12  . hydrALAZINE (APRESOLINE) 100 MG tablet Take 1 tablet (100 mg total) by mouth 2 (two) times daily. 180 tablet 2  . isosorbide mononitrate (IMDUR) 30 MG 24 hr tablet Take 3 tablets (90 mg total) by mouth daily. 270 tablet 1  . Lancet Devices (ACCU-CHEK SOFTCLIX) lancets Use as instructed three times daily before meals. 1 each 5  . latanoprost (XALATAN) 0.005 % ophthalmic solution 1 drop at bedtime.    . Omega-3 Fatty Acids (FISH OIL) 1000 MG CAPS Take 1,000 mg by mouth daily. Reported on 07/13/2015    . clopidogrel (PLAVIX) 75 MG tablet Take 1 tablet (75 mg total) by mouth daily. 30 tablet 3  . clopidogrel (PLAVIX) 75 MG tablet TAKE 1 TABLET BY MOUTH EVERY DAY 30 tablet 3  . gabapentin (NEURONTIN) 300 MG capsule Take 1 capsule (300 mg total) by mouth 3 (three) times daily. 90 capsule 3  . gabapentin (NEURONTIN) 300 MG capsule TAKE 2 CAPSULES (600 MG TOTAL) BY MOUTH 3 (THREE) TIMES DAILY AS NEEDED (NEUROPATHIC PAIN). 180 capsule 3  . glipiZIDE  (GLUCOTROL) 10 MG tablet Take 1 tablet (10 mg total) by mouth 2 (two) times daily before a meal. MUST MAKE APPT FOR FURTHER REFILLS 60 tablet 0  . sitaGLIPtin (JANUVIA) 50 MG tablet Take 1 tablet (50 mg total) by mouth daily. 30 tablet 3  . fluticasone (FLONASE) 50 MCG/ACT nasal spray Place 2 sprays into both nostrils daily. (Patient not taking: Reported on 08/05/2017) 16 g 0  . nitroGLYCERIN (NITROSTAT) 0.4 MG SL tablet Place 1 tablet (0.4 mg total) under the tongue every 5 (five) minutes as needed for chest pain. 25 tablet 3   No facility-administered medications prior to visit.     ROS Review of Systems  Constitutional: Negative for activity change, appetite change and fatigue.  HENT: Negative for congestion, sinus pressure and sore throat.   Eyes: Negative for visual disturbance.  Respiratory: Negative for cough, chest tightness, shortness of breath and wheezing.   Cardiovascular: Negative  for chest pain and palpitations.  Gastrointestinal: Negative for abdominal distention, abdominal pain and constipation.  Endocrine: Negative for polydipsia.  Genitourinary: Negative for dysuria and frequency.  Musculoskeletal: Negative for arthralgias and back pain.  Skin: Negative for rash.  Neurological: Negative for tremors, light-headedness and numbness.  Hematological: Does not bruise/bleed easily.  Psychiatric/Behavioral: Negative for agitation and behavioral problems.    Objective:  BP (!) 149/73   Pulse 62   Temp 98.2 F (36.8 C) (Oral)   Ht '5\' 4"'  (1.626 m)   Wt 205 lb (93 kg)   SpO2 99%   BMI 35.19 kg/m   BP/Weight 08/05/2017 3/53/6144 03/05/5398  Systolic BP 867 619 509  Diastolic BP 73 69 82  Wt. (Lbs) 205 207.8 194  BMI 35.19 35.67 33.3      Physical Exam  Constitutional: She is oriented to person, place, and time. She appears well-developed and well-nourished.  Cardiovascular: Normal rate, normal heart sounds and intact distal pulses.  No murmur heard. Pulmonary/Chest:  Effort normal and breath sounds normal. She has no wheezes. She has no rales. She exhibits no tenderness.  Abdominal: Soft. Bowel sounds are normal. She exhibits no distension and no mass. There is no tenderness.  Musculoskeletal: Normal range of motion. She exhibits edema (1+ b/l pedal edema).  Neurological: She is alert and oriented to person, place, and time.  Psychiatric: She has a normal mood and affect.    Lab Results  Component Value Date   HGBA1C 7.4 (A) 08/05/2017    Assessment & Plan:   1. Type 2 diabetes mellitus with peripheral angiopathy (HCC) Controlled with A1c of 7.4 No regimen changes Decrease Januvia dose due to renal function Counseled on Diabetic diet, my plate method, 326 minutes of moderate intensity exercise/week Keep blood sugar logs with fasting goals of 80-120 mg/dl, random of less than 180 and in the event of sugars less than 60 mg/dl or greater than 400 mg/dl please notify the clinic ASAP. It is recommended that you undergo annual eye exams and annual foot exams. Pneumonia vaccine is recommended. - POCT glucose (manual entry) - POCT glycosylated hemoglobin (Hb A1C) - Lipid panel; Future  2. Other diabetic neurological complication associated with type 2 diabetes mellitus (HCC) Neuropathy is controlled - sitaGLIPtin (JANUVIA) 25 MG tablet; Take 1 tablet (25 mg total) by mouth daily.  Dispense: 30 tablet; Refill: 3 - glipiZIDE (GLUCOTROL) 10 MG tablet; Take 1 tablet (10 mg total) by mouth 2 (two) times daily before a meal.  Dispense: 90 tablet; Refill: 1  3. Peripheral vascular disease (HCC) Stable Risk factor modification - clopidogrel (PLAVIX) 75 MG tablet; Take 1 tablet (75 mg total) by mouth daily.  Dispense: 90 tablet; Refill: 1  4. Status post transmetatarsal amputation of foot, left (HCC) Stable Recently seen by orthopedics  5. Essential (primary) hypertension Slightly elevated No regimen change today Counseled on blood pressure goal of less  than 130/80, low-sodium, DASH diet, medication compliance, 150 minutes of moderate intensity exercise per week. Discussed medication compliance, adverse effects.  6. CAD ([place]) Euvolemic  7. CKD (chronic kidney disease), stage IV (HCC) Combination of hypertensive and diabetic nephropathy Avoid nephrotoxins Followed by nephrology  8. Other sleep apnea - Split night study; Future   Meds ordered this encounter  Medications  . gabapentin (NEURONTIN) 300 MG capsule    Sig: Take 1 capsule (300 mg total) by mouth 3 (three) times daily.    Dispense:  90 capsule    Refill:  3  Discontinue previous dose  . sitaGLIPtin (JANUVIA) 25 MG tablet    Sig: Take 1 tablet (25 mg total) by mouth daily.    Dispense:  30 tablet    Refill:  3    Discontinue previous dose  . glipiZIDE (GLUCOTROL) 10 MG tablet    Sig: Take 1 tablet (10 mg total) by mouth 2 (two) times daily before a meal.    Dispense:  90 tablet    Refill:  1  . clopidogrel (PLAVIX) 75 MG tablet    Sig: Take 1 tablet (75 mg total) by mouth daily.    Dispense:  90 tablet    Refill:  1    DX Code Needed  .    Follow-up: Return in about 3 months (around 11/05/2017) for Follow-up of chronic medical conditions.   Charlott Rakes MD

## 2017-08-06 ENCOUNTER — Ambulatory Visit: Payer: Medicaid Other | Attending: Family Medicine

## 2017-08-06 DIAGNOSIS — E1151 Type 2 diabetes mellitus with diabetic peripheral angiopathy without gangrene: Secondary | ICD-10-CM | POA: Insufficient documentation

## 2017-08-06 NOTE — Progress Notes (Signed)
Patient here for lab visit only 

## 2017-08-07 LAB — LIPID PANEL
CHOLESTEROL TOTAL: 119 mg/dL (ref 100–199)
Chol/HDL Ratio: 4 ratio (ref 0.0–4.4)
HDL: 30 mg/dL — ABNORMAL LOW (ref >39)
LDL Calculated: 28 mg/dL (ref 0–99)
TRIGLYCERIDES: 307 mg/dL — AB (ref 0–149)
VLDL Cholesterol Cal: 61 mg/dL — ABNORMAL HIGH (ref 5–40)

## 2017-08-09 ENCOUNTER — Telehealth: Payer: Self-pay

## 2017-08-09 NOTE — Telephone Encounter (Signed)
-----   Message from Charlott Rakes, MD sent at 08/09/2017  8:49 AM EDT ----- Cholesterol is normal but triglycerides are elevated.  Please advise to commence OTC fish oil capsules.

## 2017-08-09 NOTE — Telephone Encounter (Signed)
Patient was called and informed to contact office for lab results.   If patient returns phone call please inform her of lab results below.

## 2017-08-09 NOTE — Telephone Encounter (Signed)
Patient called I gave her the results and she stated she already takes fish oils and has been taken them for a year at least. Please call back and tell her what to do

## 2017-08-16 ENCOUNTER — Telehealth: Payer: Self-pay | Admitting: Family Medicine

## 2017-08-16 NOTE — Telephone Encounter (Signed)
Patient was called and informed of lab results. Patient states that she is currently taking OTC fish oil. Should she increase dosage.

## 2017-08-16 NOTE — Telephone Encounter (Signed)
2 capsules 2 times daily would be great.

## 2017-08-16 NOTE — Telephone Encounter (Signed)
Patient called requesting telephone call from nurse regarding her test results.

## 2017-08-16 NOTE — Telephone Encounter (Signed)
Patient called requesting verification about her hydrALAZINE (APRESOLINE) 100 MG tablet [[id number]]   And the correct way to take the medication, please follow up with patient.

## 2017-08-17 ENCOUNTER — Telehealth: Payer: Self-pay | Admitting: Cardiology

## 2017-08-17 NOTE — Telephone Encounter (Signed)
Patient was called and informed of medication change  

## 2017-08-17 NOTE — Telephone Encounter (Signed)
New Message      Per patient her trycylirides are high per Dr. Owens Loffler and according to Dr, Owens Loffler it's the fish oil that has been prescribe by Dr. [name]. Pls call patient to advise

## 2017-08-19 ENCOUNTER — Ambulatory Visit (INDEPENDENT_AMBULATORY_CARE_PROVIDER_SITE_OTHER): Payer: Medicaid Other | Admitting: Orthopedic Surgery

## 2017-08-19 ENCOUNTER — Encounter (INDEPENDENT_AMBULATORY_CARE_PROVIDER_SITE_OTHER): Payer: Self-pay | Admitting: Orthopedic Surgery

## 2017-08-19 DIAGNOSIS — B351 Tinea unguium: Secondary | ICD-10-CM

## 2017-08-19 DIAGNOSIS — E1142 Type 2 diabetes mellitus with diabetic polyneuropathy: Secondary | ICD-10-CM

## 2017-08-19 DIAGNOSIS — L97421 Non-pressure chronic ulcer of left heel and midfoot limited to breakdown of skin: Secondary | ICD-10-CM

## 2017-08-19 DIAGNOSIS — Z89432 Acquired absence of left foot: Secondary | ICD-10-CM | POA: Diagnosis not present

## 2017-08-19 HISTORY — DX: Type 2 diabetes mellitus with diabetic polyneuropathy: E11.42

## 2017-08-19 NOTE — Progress Notes (Signed)
Office Visit Note   Patient: Rebekah Johnson           Date of Birth: 11-Oct-1960           MRN: [id number] Visit Date: 08/19/2017              Requested by: Charlott Rakes, MD [place], [place] [address] PCP: Charlott Rakes, MD  Chief Complaint  Patient presents with  . Left Foot - Follow-up  . Right Foot - Follow-up, Nail Problem      HPI: Patient is a 57 year old woman diabetic insensate neuropathy status post left transmetatarsal amputation who presents with ulceration on the left foot Waggoner grade 1 with onychomycotic nails of the right foot that she is unable to safely trim on her own due to her diabetic insensate neuropathy.  Assessment & Plan: Visit Diagnoses:  1. Status post transmetatarsal amputation of foot, left ([place])   2. Lower limb ulcer, heel or midfoot, left, limited to breakdown of skin ([place])   3. Onychomycosis   4. Diabetic polyneuropathy associated with type 2 diabetes mellitus (HCC)     Plan: Nails were trimmed ulcer was debrided of skin and soft tissue Band-Aid was applied to the debridement.  Reevaluate in 3 months or sooner if she develops any symptoms.  Follow-Up Instructions: Return in about 3 months (around 11/19/2017).   Ortho Exam  Patient is alert, oriented, no adenopathy, well-dressed, normal affect, normal respiratory effort. Examination patient has an antalgic gait hemoglobin A1c last evaluation 7.4.  Examination she has a Waggoner grade 1 ulcer over the medial column left transmetatarsal amputation there is no cellulitis no drainage no odor no signs of infection.  After informed consent a 10 blade knife was used to debride the skin and soft tissue back to bleeding viable soft granulation tissue.  The wound was 10 mm in diameter 1 mm deep after debridement.  This was touched with silver nitrate a Band-Aid was applied.  Examination the right foot she has thickened discolored onychomycotic nails x5 she has no paronychial  infections no cellulitis no drainage.  After informed consent the nails were trimmed x5 without complication.  Imaging: No results found. No images are attached to the encounter.  Labs: Lab Results  Component Value Date   HGBA1C 7.4 (A) 08/05/2017   HGBA1C 7.8 12/31/2016   HGBA1C 8.8 (H) 09/14/2016   ESRSEDRATE 57 (H) 07/13/2015   CRP 0.7 07/13/2015   LABURIC 5.7 07/13/2015   REPTSTATUS 01/04/2017 FINAL 01/04/2017   CULT (A) 01/01/2017    VIRIDANS STREPTOCOCCUS THE SIGNIFICANCE OF ISOLATING THIS ORGANISM FROM A SINGLE SET OF BLOOD CULTURES WHEN MULTIPLE SETS ARE DRAWN IS UNCERTAIN. PLEASE NOTIFY THE MICROBIOLOGY DEPARTMENT WITHIN ONE WEEK IF SPECIATION AND SENSITIVITIES ARE REQUIRED. Performed at [facility], [place] [address] [place], [place] [address]      Lab Results  Component Value Date   ALBUMIN 3.3 (L) 07/20/2017   ALBUMIN 3.7 01/01/2017   ALBUMIN 2.2 (L) 09/16/2016   LABURIC 5.7 07/13/2015    There is no height or weight on file to calculate BMI.  Orders:  No orders of the defined types were placed in this encounter.  No orders of the defined types were placed in this encounter.    Procedures: No procedures performed  Clinical Data: No additional findings.  ROS:  All other systems negative, except as noted in the HPI. Review of Systems  Objective: Vital Signs: There were no vitals taken for this visit.  Specialty Comments:  No specialty comments available.  PMFS History: Patient Active Problem List   Diagnosis Date Noted  . Diabetic polyneuropathy associated with type 2 diabetes mellitus ([place]) 08/19/2017  . NSTEMI (non-ST elevated myocardial infarction) ([place]) 01/01/2017  . Chest pain 01/01/2017  . Tobacco abuse   . PVD (peripheral vascular disease) ([place])   . Proteinuria   . Obesity   . Normocytic anemia   . Hypertension   . High cholesterol   . Foot amputation status ([place])   . Diabetes mellitus with neuropathy ([place])   . CKD (chronic  kidney disease), stage IV ([place])   . Chronic diastolic CHF (congestive heart failure) ([place])   . CAD (coronary artery disease) 10/27/2016  . Chronic kidney disease 09/23/2016  . Acute congestive heart failure ([place])   . Hypertensive heart disease with heart failure ([place])   . Acute diastolic CHF (congestive heart failure) ([place]) 09/14/2016  . Onychomycosis 12/24/2015  . Status post transmetatarsal amputation of foot, left ([place]) 09/06/2015  . Diabetic foot ulcer ([place]) 07/13/2015  . Cellulitis 07/12/2015  . HLD (hyperlipidemia) 09/02/2012  . Peripheral nerve disease 09/02/2012  . Type 2 diabetes mellitus with peripheral angiopathy ([place]) 09/02/2012  . Hypertriglyceridemia 07/28/2012  . Peripheral vascular disease ([place]) 07/28/2012  . Avitaminosis D 07/28/2012  . Essential (primary) hypertension 06/10/2012  . History of biliary T-tube placement 06/10/2012  . Leg pain 06/10/2012  . Current tobacco use 06/10/2012  . Atrophic vaginitis 06/07/2012  . Type 2 diabetes mellitus ([place]) 06/07/2012   Past Medical History:  Diagnosis Date  . CAD (coronary artery disease)    a. NSTEM 09/2016: cath showing severe diffuse disease of the RCA, ramus and Cx with mild-mod disease of LAD; PCI would require multiple stents and significant contrast usage thus medical therapy recommended.  . Chronic diastolic CHF (congestive heart failure) ([place])   . CKD (chronic kidney disease), stage III ([place])   . Diabetes mellitus with neuropathy ([place])   . Foot amputation status ([place])    a. h/o left foot transmetatarsal amputation  . High cholesterol   . Hypertension    a. patent renal arteries by PV angio 08/2015. b. heavy proteinuria 09/2016 ? nephrotic.  [name] Normocytic anemia   . Obesity   . Proteinuria   . PVD (peripheral vascular disease) ([place])    a. status post left SFA and popliteal stent and left SFA PTA with drug coated balloon in 08/2015.  . Tobacco abuse     Family History  Problem Relation Age of Onset  .  Diabetes Mother   . Hypertension Father     Past Surgical History:  Procedure Laterality Date  . ABDOMINAL HYSTERECTOMY    . AMPUTATION Left 09/06/2015   Procedure: LEFT TRANSMETATARSAL AMPUTATTION;  Surgeon: Newt Minion, MD;  Location: [facility];  Service: Orthopedics;  Laterality: Left;  . LEFT HEART CATH AND CORONARY ANGIOGRAPHY N/A 09/17/2016   Procedure: LEFT HEART CATH AND CORONARY ANGIOGRAPHY;  Surgeon: Jettie Booze, MD;  Location: [facility];  Service: Cardiovascular;  Laterality: N/A;  . PERIPHERAL VASCULAR CATHETERIZATION N/A 08/16/2015   Procedure: Abdominal Aortogram;  Surgeon: Elam Dutch, MD;  Location: [facility];  Service: Cardiovascular;  Laterality: N/A;  . PERIPHERAL VASCULAR CATHETERIZATION Bilateral 08/16/2015   Procedure: Lower Extremity Angiography;  Surgeon: Elam Dutch, MD;  Location: [facility];  Service: Cardiovascular;  Laterality: Bilateral;  . PERIPHERAL VASCULAR CATHETERIZATION Left 08/16/2015   Procedure: Peripheral Vascular Intervention;  Surgeon:  Elam Dutch, MD;  Location: [facility];  Service: Cardiovascular;  Laterality: Left;  SFA STENT X 2   Social History   Occupational History  . Not on file  Tobacco Use  . Smoking status: Former Smoker    Packs/day: 0.00    Types: Cigarettes    Last attempt to quit: 01/13/2017    Years since quitting: 0.5  . Smokeless tobacco: Never Used  . Tobacco comment: Down to 3 cigarettes per day  Substance and Sexual Activity  . Alcohol use: Yes    Alcohol/week: 1.0 - 2.0 standard drinks    Types: 1 - 2 Glasses of wine per week    Comment: on social occassions  . Drug use: No  . Sexual activity: Not on file

## 2017-08-20 ENCOUNTER — Telehealth: Payer: Self-pay | Admitting: Family Medicine

## 2017-08-20 NOTE — Telephone Encounter (Signed)
PA was approved, [employer], they were not able to get a paid claim yet but will resubmit periodically until it is approved. Medicaid approved it as of 2:40 pm so we are just waiting for their system to catch up. It should be able to be processed by this evening.

## 2017-08-20 NOTE — Telephone Encounter (Signed)
pa for [place]

## 2017-08-20 NOTE — Telephone Encounter (Signed)
Spoke with patient and she said to thank you

## 2017-08-30 NOTE — Progress Notes (Signed)
Cardiology Office Note    Date:  08/31/2017   ID:  Rebekah Johnson, DOB Oct 06, 1960, MRN [id number]  PCP:  Charlott Rakes, MD  Cardiologist: Ena Dawley, MD  Chief Complaint  Patient presents with  . Hospitalization Follow-up    History of Present Illness:  Rebekah Johnson is a 57 y.o. female with history of CAD status post NSTEMI 09/2016 with severe diffuse disease of the RCA, ramus and circumflex with mild to moderate disease of the LAD.  Medical therapy was recommended because multiple stents and significant and contrast would have to be used.  Also has chronic diastolic CHF, CKD stage III, DM with neuropathy and foot amputation, hypertension, HLD, PVD status post left SFA and popliteal stent and left SFA PTA with drug-coated balloon 08/2015, tobacco abuse.  Readmitted with NSTEMI and acute on chronic CHF 12/2016 managed medically.  Last seen by Dr. [name] 05/2017 at which time she was trying to take better care of herself and had stopped smoking.  Patient was hospitalized 08/23/2017 through 08/27/2017 at [facility] in [place], [place] l for severe respiratory distress/sepsis secondary to pneumonia.  She was treated with IV Rocephin and azithromycin.  BNP was elevated at 648 and she was given IV Lasix.  She was seen by cardiology because of elevated troponins-4.37.  She was felt to have a end STEMI secondary to demand ischemia.  She also had a hypertensive emergency.  She was given a Plavix load and managed medically due to renal function.  Metoprolol was stopped and Coreg started along with Norvasc.  Creatinine was 3.0 at discharge.  Urine drug screen was positive for marijuana.  Patient comes in for f/u. She is weak. Does a lot of fishing. Only has chest tightness if she tries to do too much. Hasn't seen renal in awhile. Chronic dyspnea on exertion. Quit cigarette smoking in July, still using marijuana.  Asking for referral for pulmonary because of recurrent respiratory failure and  neurologist to manage her diabetes.  Past Medical History:  Diagnosis Date  . CAD (coronary artery disease)    a. NSTEM 09/2016: cath showing severe diffuse disease of the RCA, ramus and Cx with mild-mod disease of LAD; PCI would require multiple stents and significant contrast usage thus medical therapy recommended.  . Chronic diastolic CHF (congestive heart failure) ([place])   . CKD (chronic kidney disease), stage III ([place])   . Diabetes mellitus with neuropathy ([place])   . Foot amputation status ([place])    a. h/o left foot transmetatarsal amputation  . High cholesterol   . Hypertension    a. patent renal arteries by PV angio 08/2015. b. heavy proteinuria 09/2016 ? nephrotic.  [name] Normocytic anemia   . Obesity   . Proteinuria   . PVD (peripheral vascular disease) ([place])    a. status post left SFA and popliteal stent and left SFA PTA with drug coated balloon in 08/2015.  . Tobacco abuse     Past Surgical History:  Procedure Laterality Date  . ABDOMINAL HYSTERECTOMY    . AMPUTATION Left 09/06/2015   Procedure: LEFT TRANSMETATARSAL AMPUTATTION;  Surgeon: Newt Minion, MD;  Location: [facility];  Service: Orthopedics;  Laterality: Left;  . LEFT HEART CATH AND CORONARY ANGIOGRAPHY N/A 09/17/2016   Procedure: LEFT HEART CATH AND CORONARY ANGIOGRAPHY;  Surgeon: Jettie Booze, MD;  Location: [facility];  Service: Cardiovascular;  Laterality: N/A;  . PERIPHERAL VASCULAR CATHETERIZATION N/A 08/16/2015   Procedure: Abdominal Aortogram;  Surgeon: Elam Dutch, MD;  Location: [facility];  Service: Cardiovascular;  Laterality: N/A;  . PERIPHERAL VASCULAR CATHETERIZATION Bilateral 08/16/2015   Procedure: Lower Extremity Angiography;  Surgeon: Elam Dutch, MD;  Location: [facility];  Service: Cardiovascular;  Laterality: Bilateral;  . PERIPHERAL VASCULAR CATHETERIZATION Left 08/16/2015   Procedure: Peripheral Vascular Intervention;  Surgeon: Elam Dutch, MD;  Location: [facility];  Service: Cardiovascular;  Laterality: Left;  SFA STENT X 2    Current Medications: Current Meds  Medication Sig  . amLODipine (NORVASC) 10 MG tablet Take 1 tablet (10 mg total) by mouth daily. Reported on 07/13/2015  . aspirin 81 MG EC tablet Take 1 tablet (81 mg total) by mouth daily. Reported on 07/13/2015  . atorvastatin (LIPITOR) 80 MG tablet Take 1 tablet (80 mg total) by mouth daily.  . blood glucose meter kit and supplies KIT Dispense based on patient and insurance preference. Use up to four times daily as directed. (FOR ICD-9 250.00, 250.01).  . Blood Glucose Monitoring Suppl (ACCU-CHEK AVIVA) device Use as instructed three times daily.  . carvedilol (COREG) 25 MG tablet Take 1 tablet (25 mg total) by mouth 2 (two) times daily.  . Cholecalciferol (VITAMIN D-3) 1000 units CAPS Take 1,000 Units by mouth daily.   . clopidogrel (PLAVIX) 75 MG tablet Take 1 tablet (75 mg total) by mouth daily.  [name] docusate sodium (COLACE) 100 MG capsule TAKE ONE CAPSULE BY MOUTH TWICE A DAY  . furosemide (LASIX) 40 MG tablet Take 40 mg by mouth 2 (two) times daily.  [name] gabapentin (NEURONTIN) 300 MG capsule Take 1 capsule (300 mg total) by mouth 3 (three) times daily.  [name] glipiZIDE (GLUCOTROL) 10 MG tablet Take 1 tablet (10 mg total) by mouth 2 (two) times daily before a meal.  . glucose blood (ACCU-CHEK AVIVA) test strip Use as instructed three times daily before meals.  . hydrALAZINE (APRESOLINE) 100 MG tablet Take 1 tablet (100 mg total) by mouth 2 (two) times daily.  . isosorbide mononitrate (IMDUR) 30 MG 24 hr tablet Take 3 tablets (90 mg total) by mouth daily.  Elmore Guise Devices (ACCU-CHEK SOFTCLIX) lancets Use as instructed three times daily before meals.  . latanoprost (XALATAN) 0.005 % ophthalmic solution 1 drop at bedtime.  . Omega-3 Fatty Acids (FISH OIL) 1000 MG CAPS Take 1,000 mg by mouth daily. Reported on 07/13/2015  . predniSONE (DELTASONE) 20 MG tablet Take by mouth.  . sitaGLIPtin  (JANUVIA) 25 MG tablet Take 1 tablet (25 mg total) by mouth daily.     Allergies:   Patient has no known allergies.   Social History   Socioeconomic History  . Marital status: Married    Spouse name: Not on file  . Number of children: Not on file  . Years of education: Not on file  . Highest education level: Not on file  Occupational History  . Not on file  Social Needs  . Financial resource strain: Not on file  . Food insecurity:    Worry: Not on file    Inability: Not on file  . Transportation needs:    Medical: Not on file    Non-medical: Not on file  Tobacco Use  . Smoking status: Former Smoker    Packs/day: 0.00    Types: Cigarettes    Last attempt to quit: 01/13/2017    Years since quitting: 0.6  . Smokeless tobacco: Never Used  . Tobacco comment: Down to 3 cigarettes per day  Substance  and Sexual Activity  . Alcohol use: Yes    Alcohol/week: 1.0 - 2.0 standard drinks    Types: 1 - 2 Glasses of wine per week    Comment: on social occassions  . Drug use: No  . Sexual activity: Not on file  Lifestyle  . Physical activity:    Days per week: Not on file    Minutes per session: Not on file  . Stress: Not on file  Relationships  . Social connections:    Talks on phone: Not on file    Gets together: Not on file    Attends religious service: Not on file    Active member of club or organization: Not on file    Attends meetings of clubs or organizations: Not on file    Relationship status: Not on file  Other Topics Concern  . Not on file  Social History Narrative  . Not on file     Family History:  The patient's family history includes Diabetes in her mother; Hypertension in her father.   ROS:   Please see the history of present illness.    Review of Systems  Constitution: Positive for malaise/fatigue.  Cardiovascular: Positive for chest pain and dyspnea on exertion.  Respiratory: Positive for shortness of breath.   Neurological: Positive for headaches and  weakness.   All other systems reviewed and are negative.   PHYSICAL EXAM:   VS:  BP (!) 170/70   Pulse 84   Ht '5\' 4"'  (1.626 m)   Wt 213 lb 6.4 oz (96.8 kg)   SpO2 98%   BMI 36.63 kg/m   Physical Exam  GEN: Obese, in no acute distress  Neck: no JVD, carotid bruits, or masses Cardiac:RRR; positive S4 Respiratory: Decreased breath sounds throughout but clear GI: soft, nontender, nondistended, + BS Ext: without cyanosis, clubbing, or edema, Good distal pulses bilaterally Neuro:  Alert and Oriented x 3 Psych: euthymic mood, full affect  Wt Readings from Last 3 Encounters:  08/31/17 213 lb 6.4 oz (96.8 kg)  08/05/17 205 lb (93 kg)  07/28/17 207 lb 12.8 oz (94.3 kg)      Studies/Labs Reviewed:   EKG:  EKG is not ordered today.  The ekg written summary from [facility] 08/23/2017 normal sinus rhythm at 89 bpm with nonspecific ST-T wave changes Recent Labs: 09/14/2016: TSH 0.959 01/01/2017: Magnesium 1.7 05/28/2017: NT-Pro BNP 1,155 06/04/2017: B Natriuretic Peptide 273.1 07/20/2017: ALT 27; BUN 36; Creatinine 3.17; Hemoglobin 10.8; Platelet Count 229; Potassium 3.9; Sodium 137   Lipid Panel    Component Value Date/Time   CHOL 119 08/06/2017 0920   TRIG 307 (H) 08/06/2017 0920   HDL 30 (L) 08/06/2017 0920   CHOLHDL 4.0 08/06/2017 0920   CHOLHDL 3.7 09/16/2016 0559   VLDL 33 09/16/2016 0559   LDLCALC 28 08/06/2017 0920    Additional studies/ records that were reviewed today include:  LEFT HEART CATH AND CORONARY ANGIOGRAPHY  Conclusion 09/2016      Ost 1st Diag lesion, 25 %stenosed.  Ost 2nd Diag lesion, 25 %stenosed.  Ramus-1 lesion, 50 %stenosed.  Ramus-2 lesion, 80 %stenosed.  Prox Cx to Mid Cx lesion, 100 %stenosed.  Prox RCA to Mid RCA lesion, 80 %stenosed.  RPDA lesion, 80 %stenosed.  Dist RCA lesion, 80 %stenosed.  Mid RCA to Dist RCA lesion, 80 %stenosed.  LV end diastolic pressure is mildly elevated.  There is no aortic valve stenosis.     Severe diffuse disease of the RCA, Ramus  and circumflex.  LAD with only mild to moderate diffuse disease.  PCI would require multiple stents and significant contrast usage.  For now, would treat medically.         Echo 09/15/16 Study Conclusions   - Left ventricle: The cavity size was normal. Wall thickness was   increased in a pattern of mild LVH. Systolic function was normal.   The estimated ejection fraction was in the range of 55% to 60%.   There is akinesis of the basal-midinferolateral myocardium.   Features are consistent with a pseudonormal left ventricular   filling pattern, with concomitant abnormal relaxation and   increased filling pressure (grade 2 diastolic dysfunction).   Doppler parameters are consistent with high ventricular filling   pressure. - Mitral valve: There was mild regurgitation. - Left atrium: The atrium was mildly dilated.   Impressions:   - Akinesis of the basal/mid inferolateral wall with overall   preserved LV systolic function; moderate diastolic dysfunction;   mild LVH; mild MR; mild LAE.     ASSESSMENT:    1. Chronic diastolic CHF (congestive heart failure) ([place])   2. Coronary artery disease involving native coronary artery of native heart without angina pectoris   3. Essential (primary) hypertension   4. Type 2 diabetes mellitus with peripheral angiopathy ([facility])   5. Mixed hyperlipidemia   6. CKD (chronic kidney disease), stage IV ([place])   7. Chronic obstructive pulmonary disease, unspecified COPD type ([place])      PLAN:  In order of problems listed above:  Chronic diastolic CHF compensated.  On Lasix and Coreg  CAD extensive on cath 09/2016 but has been managed medically because of CKD and need for multiple stents.  Recent N STEMI 08/23/2017 in the setting of respiratory failure, pneumonia, CHF [place].  Patient has occasional angina if she overexerts herself.  We will add Ranexa 500 mg twice daily.  Check  2D echo to reevaluate LV function.  Follow-up with Dr. [name] for further evaluation and treatment.  Essential hypertension blood pressure stable today adding Ranexa for angina.  Type 2 diabetes mellitus managed by PCP and asking for referral to endocrinologist.  Hyperlipidemia LDL 28 but triglycerides high at 3076/02/24  CKD stage IV last creatinine 3.0 08/27/2017  COPD with respiratory failure a week ago.  Has had pneumonia 3 times this year.  Quit smoking in July.  Is asking for pulmonologist.  Will refer.  Medication Adjustments/Labs and Tests Ordered: Current medicines are reviewed at length with the patient today.  Concerns regarding medicines are outlined above.  Medication changes, Labs and Tests ordered today are listed in the Patient Instructions below. Patient Instructions  Medication Instructions:  .Your physician has recommended you make the following change in your medication:  1.  START Ranexa 500 mg taking 1 tablet twice a day  Labwork: None ordered  Testing/Procedures: Your physician has requested that you have an echocardiogram. Echocardiography is a painless test that uses sound waves to create images of your heart. It provides your doctor with information about the size and shape of your heart and how well your heart's chambers and valves are working. This procedure takes approximately one hour. There are no restrictions for this procedure.   Follow-Up: Your physician recommends that you schedule a follow-up appointment in: [place]   You have been referred to ENDOCRINOLOGY, DR. Buddy Duty.  HIS OFFICE WILL CONTACT YOU WITH AN APPT  You have been referred to [facility].  THEY WILL CONTACT YOU WITH AN APPT.  YOU NEED TO CONTACT [facility] FOR AN APPT TODAY.  Any Other Special Instructions Will Be Listed Below (If Applicable).  Echocardiogram An echocardiogram, or echocardiography, uses sound waves (ultrasound) to produce an image of  your heart. The echocardiogram is simple, painless, obtained within a short period of time, and offers valuable information to your health care provider. The images from an echocardiogram can provide information such as:  Evidence of coronary artery disease (CAD).  Heart size.  Heart muscle function.  Heart valve function.  Aneurysm detection.  Evidence of a past heart attack.  Fluid buildup around the heart.  Heart muscle thickening.  Assess heart valve function.  Tell a health care provider about:  Any allergies you have.  All medicines you are taking, including vitamins, herbs, eye drops, creams, and over-the-counter medicines.  Any problems you or family members have had with anesthetic medicines.  Any blood disorders you have.  Any surgeries you have had.  Any medical conditions you have.  Whether you are pregnant or may be pregnant. What happens before the procedure? No special preparation is needed. Eat and drink normally. What happens during the procedure?  In order to produce an image of your heart, gel will be applied to your chest and a wand-like tool (transducer) will be moved over your chest. The gel will help transmit the sound waves from the transducer. The sound waves will harmlessly bounce off your heart to allow the heart images to be captured in real-time motion. These images will then be recorded.  You may need an IV to receive a medicine that improves the quality of the pictures. What happens after the procedure? You may return to your normal schedule including diet, activities, and medicines, unless your health care provider tells you otherwise. This information is not intended to replace advice given to you by your health care provider. Make sure you discuss any questions you have with your health care provider. Document Released: 12/20/1999 Document Revised: 08/10/2015 Document Reviewed: 08/29/2012 Elsevier Interactive Patient Education  2017  [employer].    If you need a refill on your cardiac medications before your next appointment, please call your pharmacy.      Sumner Boast, PA-C  08/31/2017 11:12 AM    [facility] [address], [place], [place]  [address] Phone: [phone]; Fax: [phone]

## 2017-08-31 ENCOUNTER — Encounter: Payer: Self-pay | Admitting: Physician Assistant

## 2017-08-31 ENCOUNTER — Encounter

## 2017-08-31 ENCOUNTER — Ambulatory Visit (INDEPENDENT_AMBULATORY_CARE_PROVIDER_SITE_OTHER): Payer: Medicaid Other | Admitting: Physician Assistant

## 2017-08-31 VITALS — BP 170/70 | HR 84 | Ht 64.0 in | Wt 213.4 lb

## 2017-08-31 DIAGNOSIS — I10 Essential (primary) hypertension: Secondary | ICD-10-CM | POA: Diagnosis not present

## 2017-08-31 DIAGNOSIS — E1151 Type 2 diabetes mellitus with diabetic peripheral angiopathy without gangrene: Secondary | ICD-10-CM

## 2017-08-31 DIAGNOSIS — N184 Chronic kidney disease, stage 4 (severe): Secondary | ICD-10-CM

## 2017-08-31 DIAGNOSIS — I5032 Chronic diastolic (congestive) heart failure: Secondary | ICD-10-CM | POA: Diagnosis not present

## 2017-08-31 DIAGNOSIS — I2510 Atherosclerotic heart disease of native coronary artery without angina pectoris: Secondary | ICD-10-CM

## 2017-08-31 DIAGNOSIS — J449 Chronic obstructive pulmonary disease, unspecified: Secondary | ICD-10-CM | POA: Insufficient documentation

## 2017-08-31 DIAGNOSIS — E782 Mixed hyperlipidemia: Secondary | ICD-10-CM

## 2017-08-31 HISTORY — DX: Chronic obstructive pulmonary disease, unspecified: J44.9

## 2017-08-31 MED ORDER — RANOLAZINE ER 500 MG PO TB12: 500.0000 mg | ORAL_TABLET | Freq: Two times a day (BID) | ORAL | 3 refills | Status: DC

## 2017-08-31 NOTE — Addendum Note (Signed)
Addended by: Gaetano Net on: 08/31/2017 11:56 AM   Modules accepted: Orders

## 2017-08-31 NOTE — Patient Instructions (Addendum)
Medication Instructions:  .Your physician has recommended you make the following change in your medication:  1.  START Ranexa 500 mg taking 1 tablet twice a day  Labwork: None ordered  Testing/Procedures: Your physician has requested that you have an echocardiogram. Echocardiography is a painless test that uses sound waves to create images of your heart. It provides your doctor with information about the size and shape of your heart and how well your heart's chambers and valves are working. This procedure takes approximately one hour. There are no restrictions for this procedure.   Follow-Up: Your physician recommends that you schedule a follow-up appointment in: [place]   You have been referred to ENDOCRINOLOGY, DR. Buddy Duty.  HIS OFFICE WILL CONTACT YOU WITH AN APPT  You have been referred to [facility].  THEY WILL CONTACT YOU WITH AN APPT.  YOU NEED TO CONTACT [facility] FOR AN APPT TODAY.  Any Other Special Instructions Will Be Listed Below (If Applicable).  Echocardiogram An echocardiogram, or echocardiography, uses sound waves (ultrasound) to produce an image of your heart. The echocardiogram is simple, painless, obtained within a short period of time, and offers valuable information to your health care provider. The images from an echocardiogram can provide information such as:  Evidence of coronary artery disease (CAD).  Heart size.  Heart muscle function.  Heart valve function.  Aneurysm detection.  Evidence of a past heart attack.  Fluid buildup around the heart.  Heart muscle thickening.  Assess heart valve function.  Tell a health care provider about:  Any allergies you have.  All medicines you are taking, including vitamins, herbs, eye drops, creams, and over-the-counter medicines.  Any problems you or family members have had with anesthetic medicines.  Any blood disorders you have.  Any surgeries you have had.  Any  medical conditions you have.  Whether you are pregnant or may be pregnant. What happens before the procedure? No special preparation is needed. Eat and drink normally. What happens during the procedure?  In order to produce an image of your heart, gel will be applied to your chest and a wand-like tool (transducer) will be moved over your chest. The gel will help transmit the sound waves from the transducer. The sound waves will harmlessly bounce off your heart to allow the heart images to be captured in real-time motion. These images will then be recorded.  You may need an IV to receive a medicine that improves the quality of the pictures. What happens after the procedure? You may return to your normal schedule including diet, activities, and medicines, unless your health care provider tells you otherwise. This information is not intended to replace advice given to you by your health care provider. Make sure you discuss any questions you have with your health care provider. Document Released: 12/20/1999 Document Revised: 08/10/2015 Document Reviewed: 08/29/2012 Elsevier Interactive Patient Education  2017 [employer].    If you need a refill on your cardiac medications before your next appointment, please call your pharmacy.

## 2017-09-01 ENCOUNTER — Encounter: Payer: Self-pay | Admitting: Internal Medicine

## 2017-09-01 ENCOUNTER — Telehealth: Payer: Self-pay | Admitting: Family Medicine

## 2017-09-01 ENCOUNTER — Ambulatory Visit (INDEPENDENT_AMBULATORY_CARE_PROVIDER_SITE_OTHER): Payer: Medicaid Other | Admitting: Internal Medicine

## 2017-09-01 ENCOUNTER — Other Ambulatory Visit: Payer: Self-pay

## 2017-09-01 ENCOUNTER — Ambulatory Visit (INDEPENDENT_AMBULATORY_CARE_PROVIDER_SITE_OTHER)
Admission: RE | Admit: 2017-09-01 | Discharge: 2017-09-01 | Disposition: A | Discharge status: Home / Self Care | Payer: Medicaid Other | Source: Ambulatory Visit | Attending: Internal Medicine | Admitting: Internal Medicine

## 2017-09-01 VITALS — BP 144/78 | HR 78 | Ht 64.0 in | Wt 214.2 lb

## 2017-09-01 DIAGNOSIS — J449 Chronic obstructive pulmonary disease, unspecified: Secondary | ICD-10-CM

## 2017-09-01 DIAGNOSIS — R0609 Other forms of dyspnea: Secondary | ICD-10-CM

## 2017-09-01 DIAGNOSIS — R0600 Dyspnea, unspecified: Secondary | ICD-10-CM

## 2017-09-01 HISTORY — DX: Dyspnea, unspecified: R06.00

## 2017-09-01 HISTORY — DX: Other forms of dyspnea: R06.09

## 2017-09-01 MED ORDER — UMECLIDINIUM-VILANTEROL 62.5-25 MCG/INH IN AEPB: 1.0000 | INHALATION_SPRAY | Freq: Every day | RESPIRATORY_TRACT | 0 refills | Status: DC

## 2017-09-01 MED ORDER — UMECLIDINIUM-VILANTEROL 62.5-25 MCG/INH IN AEPB: 1.0000 | INHALATION_SPRAY | Freq: Every day | RESPIRATORY_TRACT | 11 refills | Status: DC

## 2017-09-01 NOTE — Progress Notes (Signed)
Rebekah Johnson, female    DOB: 12-12-1960,     MRN: [id number]    Brief patient profile:  52 yowbf quit smoking  07/17/17 s/p last admit at [facility] with dx "na/ mild heart attack"  Patient was hospitalized 08/23/2017 through 08/27/2017 at [facility] in [place], [place] l for severe respiratory distress/sepsis secondary to pneumonia.  She was treated with IV Rocephin and azithromycin.  BNP was elevated at 648 and she was given IV Lasix.  She was seen by cardiology because of elevated troponins-4.37.  She was felt to have a end STEMI secondary to demand ischemia.  She also had a hypertensive emergency.  She was given a Plavix load and managed medically due to renal function.  Metoprolol was stopped and Coreg started along with Norvasc. Creatinine was 3.0 at discharge.  Urine drug screen was positive for marijuana.    09/01/2017  1st Pulmonary eval re ? pna  Chief Complaint  Patient presents with  . Pulmonary Consult    Referred by Dr. [name]. Pt states she has had PNA x 3 so far in 2019. She states her breathing is overall doing well at this time. She has occ mild chest tightness.   Dyspnea:  MMRC3 = can't walk 100 yards even at a slow pace at a flat grade s stopping due to sob  eg [employer] vs  [employer] a bit much - really more limited by ? Claudication than sob typically  Cough: none Sleep: prop up 45 degrees x years  SABA use:  None     No obvious day to day or daytime variability or assoc excess/ purulent sputum or mucus plugs or hemoptysis or cp or   subjective wheeze or overt sinus or hb symptoms.   Sleeping as above  without nocturnal  or early am exacerbation  of respiratory  c/o's or need for noct saba. Also denies any obvious fluctuation of symptoms with weather or environmental changes or other aggravating or alleviating factors except as outlined above   No unusual exposure hx or h/o childhood pna/ asthma or knowledge of premature birth.  Current Allergies, Complete Past  Medical History, Past Surgical History, Family History, and Social History were reviewed in [employer] record.  ROS  The following are not active complaints unless bolded Hoarseness, sore throat, dysphagia, dental problems, itching, sneezing,  nasal congestion or discharge of excess mucus or purulent secretions, ear ache,   fever, chills, sweats, unintended wt loss or wt gain, classically pleuritic or exertional cp,  orthopnea pnd or arm/hand swelling  or leg swelling, presyncope, palpitations, abdominal pain, anorexia, nausea, vomiting, diarrhea  or change in bowel habits or change in bladder habits, change in stools or change in urine, dysuria, hematuria,  rash, arthralgias, visual complaints, headache, numbness, weakness or ataxia or problems with walking or coordination,  change in mood or  memory.                 Past Medical History:  Diagnosis Date  . CAD (coronary artery disease)    a. NSTEM 09/2016: cath showing severe diffuse disease of the RCA, ramus and Cx with mild-mod disease of LAD; PCI would require multiple stents and significant contrast usage thus medical therapy recommended.  . Chronic diastolic CHF (congestive heart failure) ([facility])   . CKD (chronic kidney disease), stage III ([place])   . Diabetes mellitus with neuropathy ([place])   . Foot amputation status ([place])    a. h/o left foot transmetatarsal  amputation  . High cholesterol   . Hypertension    a. patent renal arteries by PV angio 08/2015. b. heavy proteinuria 09/2016 ? nephrotic.  [name] Normocytic anemia   . Obesity   . Proteinuria   . PVD (peripheral vascular disease) ([place])    a. status post left SFA and popliteal stent and left SFA PTA with drug coated balloon in 08/2015.  . Tobacco abuse     Outpatient Medications Prior to Visit  Medication Sig Dispense Refill  . amLODipine (NORVASC) 10 MG tablet Take 1 tablet (10 mg total) by mouth daily. Reported on 07/13/2015 90 tablet 2  . aspirin 81 MG EC tablet  Take 1 tablet (81 mg total) by mouth daily. Reported on 07/13/2015 90 tablet 0  . atorvastatin (LIPITOR) 80 MG tablet Take 1 tablet (80 mg total) by mouth daily. 90 tablet 2  . blood glucose meter kit and supplies KIT Dispense based on patient and insurance preference. Use up to four times daily as directed. (FOR ICD-9 250.00, 250.01). 1 each 3  . Blood Glucose Monitoring Suppl (ACCU-CHEK AVIVA) device Use as instructed three times daily. 1 each 0  . carvedilol (COREG) 25 MG tablet Take 1 tablet (25 mg total) by mouth 2 (two) times daily. 180 tablet 2  . Cholecalciferol (VITAMIN D-3) 1000 units CAPS Take 1,000 Units by mouth daily.     . clopidogrel (PLAVIX) 75 MG tablet Take 1 tablet (75 mg total) by mouth daily. 90 tablet 1  . docusate sodium (COLACE) 100 MG capsule TAKE ONE CAPSULE BY MOUTH TWICE A DAY 60 capsule 0  . furosemide (LASIX) 40 MG tablet Take 40 mg by mouth 2 (two) times daily.    [name] gabapentin (NEURONTIN) 300 MG capsule Take 1 capsule (300 mg total) by mouth 3 (three) times daily. 90 capsule 3  . glipiZIDE (GLUCOTROL) 10 MG tablet Take 1 tablet (10 mg total) by mouth 2 (two) times daily before a meal. 90 tablet 1  . glucose blood (ACCU-CHEK AVIVA) test strip Use as instructed three times daily before meals. 100 each 12  . hydrALAZINE (APRESOLINE) 100 MG tablet Take 1 tablet (100 mg total) by mouth 2 (two) times daily. 180 tablet 2  . isosorbide mononitrate (IMDUR) 30 MG 24 hr tablet Take 3 tablets (90 mg total) by mouth daily. 270 tablet 1  . Lancet Devices (ACCU-CHEK SOFTCLIX) lancets Use as instructed three times daily before meals. 1 each 5  . latanoprost (XALATAN) 0.005 % ophthalmic solution 1 drop at bedtime.    . nitroGLYCERIN (NITROSTAT) 0.4 MG SL tablet Place 1 tablet (0.4 mg total) under the tongue every 5 (five) minutes as needed for chest pain. 25 tablet 3  . Omega-3 Fatty Acids (FISH OIL) 1000 MG CAPS Take 1,000 mg by mouth daily. Reported on 07/13/2015    . ranolazine  (RANEXA) 500 MG 12 hr tablet Take 1 tablet (500 mg total) by mouth 2 (two) times daily. 180 tablet 3  . sitaGLIPtin (JANUVIA) 25 MG tablet Take 1 tablet (25 mg total) by mouth daily. (Patient not taking: Reported on 09/01/2017) 30 tablet 3   No facility-administered medications prior to visit.               Objective:     BP (!) 144/78 (BP Location: Left Arm, Cuff Size: Normal)   Pulse 78   Ht '5\' 4"'  (1.626 m)   Wt 214 lb 3.2 oz (97.2 kg)   SpO2 99%   BMI 36.77  kg/m   SpO2: 99 %  RA  Very pleasant jovial amb bf nad   HEENT: nl dentition, turbinates bilaterally, and oropharynx. Nl external ear canals without cough reflex   NECK :  without JVD/Nodes/TM/ nl carotid upstrokes bilaterally   LUNGS: no acc muscle use,  Nl contour chest slt distant bs  bilaterally without cough on insp or exp maneuvers   CV:  RRR  no s3 or murmur or increase in P2, and    trace to 1+ sym pitting both lower ext    ABD:  Quite obese  soft and nontender with  Limited inspiratory excursion in the supine position. No bruits or organomegaly appreciated, bowel sounds nl  MS:  Nl gait/ ext warm without deformities, calf tenderness, cyanosis or clubbing No obvious joint restrictions   SKIN: warm and dry without lesions    NEURO:  alert, approp, nl sensorium with  no motor or cerebellar deficits apparent.    CXR PA and Lateral:   09/01/2017 :    I personally reviewed images and  impression as follows:   slt decreased lung volumes     Assessment   DOE (dyspnea on exertion) C/w obesity plus chf (f/u cards planned) and copd  (see separate a/p)   COPD GOLD II  Quit smoking 07/17/17 - Spirometry 09/01/2017  FEV1 1.23 (58%)  Ratio 57 with mild curvature p no rx - 09/01/2017  After extensive coaching inhaler device,  effectiveness =    75% with elipta with cough provoked   Now that has quit smoking likely she is GOLD II/ group B n terms of symptom/risk and laba/lama therefore appropriate rx at this  point.  If can't tolerate dpi then try bevespi next but the real key is to maintain off all cigs    I reviewed the Fletcher curve with the patient that basically indicates  if you quit smoking when your best day FEV1 is still well preserved (as is clearly  the case here)  it is highly unlikely you will progress to severe disease and informed the patient there was  no medication on the market that has proven to alter the curve/ its downward trajectory  or the likelihood of progression of their disease(unlike other chronic medical conditions such as atheroclerosis where we do think we can change the natural hx with risk reducing meds)    Therefore stopping smoking and maintaining abstinence are  the most important aspects of her care, not choice of inhalers or for that matter, doctors.   Treatment other than smoking cessation  is entirely directed by severity of symptoms and focused also on reducing exacerbations, not attempting to change the natural history of the disease.     Total time devoted to counseling  > 50 % of initial 60 min office visit:  review case with pt/ discussion of options/alternatives/ personally creating written customized instructions  in presence of pt  then going over those specific  Instructions directly with the pt including how to use all of the meds but in particular covering each new medication in detail and the difference between the maintenance= "automatic" meds and the prns using an action plan format for the latter (If this problem/symptom => do that organization reading Left to right).  Please see AVS from this visit for a full list of these instructions which I personally wrote for this pt and  are unique to this visit.   See device teaching which extended face to face time for this visit  Christinia Gully, MD 09/01/2017

## 2017-09-01 NOTE — Telephone Encounter (Signed)
Called pharmacy and had them re-run claim with today's date and they were able to get her medication approved and were instructed by myself to treat it as a Doctor, general practice and confirmed they could do that.  I then notified the patient that her medication would be ready at the pharmacy shortly and she was very grateful. I also instructed her to call back if there were any further issues and in the future if there was a problem that she can contact us much sooner and we are happy to take care of any issues she has in regards to her meds. She states she will call me back once she picks up her Januvia, I let her know she was welcome to call me back on my direct line @ [phone]

## 2017-09-01 NOTE — Assessment & Plan Note (Addendum)
C/w obesity plus chf (f/u cards planned) and copd  (see separate a/p)

## 2017-09-01 NOTE — Patient Instructions (Addendum)
Congratulations on not smoking, it's the most important aspect of  Your care    Try anoro one click each am and take two smooth deep drags then brush teeth/ tongue/gargle and spit    Please remember to go to the  x-ray department downstairs in the basement  for your tests - we will call you with the results when they are available.       Please schedule a follow up office visit in 6 weeks, call sooner if needed with pfts on return

## 2017-09-01 NOTE — Assessment & Plan Note (Signed)
Quit smoking 07/17/17 - Spirometry 09/01/2017  FEV1 1.23 (58%)  Ratio 57 with mild curvature p no rx - 09/01/2017  After extensive coaching inhaler device,  effectiveness =    75% with elipta with cough provoked   Now that has quit smoking likely she is GOLD II/ group B n terms of symptom/risk and laba/lama therefore appropriate rx at this point.  If can't tolerate dpi then try bevespi next but the real key is to maintain off all cigs    I reviewed the Fletcher curve with the patient that basically indicates  if you quit smoking when your best day FEV1 is still well preserved (as is clearly  the case here)  it is highly unlikely you will progress to severe disease and informed the patient there was  no medication on the market that has proven to alter the curve/ its downward trajectory  or the likelihood of progression of their disease(unlike other chronic medical conditions such as atheroclerosis where we do think we can change the natural hx with risk reducing meds)    Therefore stopping smoking and maintaining abstinence are  the most important aspects of her care, not choice of inhalers or for that matter, doctors.   Treatment other than smoking cessation  is entirely directed by severity of symptoms and focused also on reducing exacerbations, not attempting to change the natural history of the disease.     Total time devoted to counseling  > 50 % of initial 60 min office visit:  review case with pt/ discussion of options/alternatives/ personally creating written customized instructions  in presence of pt  then going over those specific  Instructions directly with the pt including how to use all of the meds but in particular covering each new medication in detail and the difference between the maintenance= "automatic" meds and the prns using an action plan format for the latter (If this problem/symptom => do that organization reading Left to right).  Please see AVS from this visit for a full list of  these instructions which I personally wrote for this pt and  are unique to this visit.   See device teaching which extended face to face time for this visit

## 2017-09-01 NOTE — Telephone Encounter (Signed)
Patient called requesting Rebekah Johnson after stating that the pharmacy was still refusing to give it to her. Patient uses [facility] on [address]. Please follow up with patient.

## 2017-09-02 ENCOUNTER — Ambulatory Visit (HOSPITAL_COMMUNITY): Payer: Medicaid Other | Attending: Physician Assistant

## 2017-09-02 ENCOUNTER — Telehealth: Payer: Self-pay | Admitting: Internal Medicine

## 2017-09-02 ENCOUNTER — Other Ambulatory Visit: Payer: Self-pay

## 2017-09-02 DIAGNOSIS — E1122 Type 2 diabetes mellitus with diabetic chronic kidney disease: Secondary | ICD-10-CM | POA: Insufficient documentation

## 2017-09-02 DIAGNOSIS — I252 Old myocardial infarction: Secondary | ICD-10-CM | POA: Diagnosis not present

## 2017-09-02 DIAGNOSIS — I340 Nonrheumatic mitral (valve) insufficiency: Secondary | ICD-10-CM | POA: Diagnosis not present

## 2017-09-02 DIAGNOSIS — E1151 Type 2 diabetes mellitus with diabetic peripheral angiopathy without gangrene: Secondary | ICD-10-CM | POA: Diagnosis not present

## 2017-09-02 DIAGNOSIS — I5032 Chronic diastolic (congestive) heart failure: Secondary | ICD-10-CM | POA: Diagnosis present

## 2017-09-02 DIAGNOSIS — I2510 Atherosclerotic heart disease of native coronary artery without angina pectoris: Secondary | ICD-10-CM | POA: Diagnosis present

## 2017-09-02 DIAGNOSIS — N184 Chronic kidney disease, stage 4 (severe): Secondary | ICD-10-CM | POA: Diagnosis not present

## 2017-09-02 DIAGNOSIS — I10 Essential (primary) hypertension: Secondary | ICD-10-CM | POA: Diagnosis not present

## 2017-09-02 DIAGNOSIS — I130 Hypertensive heart and chronic kidney disease with heart failure and stage 1 through stage 4 chronic kidney disease, or unspecified chronic kidney disease: Secondary | ICD-10-CM | POA: Insufficient documentation

## 2017-09-02 DIAGNOSIS — E782 Mixed hyperlipidemia: Secondary | ICD-10-CM

## 2017-09-02 DIAGNOSIS — I110 Hypertensive heart disease with heart failure: Secondary | ICD-10-CM | POA: Diagnosis present

## 2017-09-02 NOTE — Progress Notes (Signed)
LMTCB

## 2017-09-02 NOTE — Telephone Encounter (Signed)
Notes recorded by Tanda Rockers, MD on 09/02/2017 at 5:33 AM EDT Call pt: Reviewed cxr and no acute change so no change in recommendations made at ov ------------------------------------- Spoke with pt. She is calling back in regards to her CXR results. She is aware of results. Nothing further was needed.

## 2017-09-03 ENCOUNTER — Other Ambulatory Visit (HOSPITAL_COMMUNITY): Payer: Medicaid Other

## 2017-09-03 ENCOUNTER — Ambulatory Visit (INDEPENDENT_AMBULATORY_CARE_PROVIDER_SITE_OTHER): Payer: Medicaid Other | Admitting: Cardiology

## 2017-09-03 ENCOUNTER — Encounter: Payer: Self-pay | Admitting: Cardiology

## 2017-09-03 VITALS — BP 140/70 | HR 82 | Ht 64.0 in | Wt 221.0 lb

## 2017-09-03 DIAGNOSIS — E782 Mixed hyperlipidemia: Secondary | ICD-10-CM | POA: Diagnosis not present

## 2017-09-03 DIAGNOSIS — J449 Chronic obstructive pulmonary disease, unspecified: Secondary | ICD-10-CM

## 2017-09-03 DIAGNOSIS — I130 Hypertensive heart and chronic kidney disease with heart failure and stage 1 through stage 4 chronic kidney disease, or unspecified chronic kidney disease: Secondary | ICD-10-CM

## 2017-09-03 DIAGNOSIS — I2510 Atherosclerotic heart disease of native coronary artery without angina pectoris: Secondary | ICD-10-CM

## 2017-09-03 DIAGNOSIS — N184 Chronic kidney disease, stage 4 (severe): Secondary | ICD-10-CM | POA: Diagnosis not present

## 2017-09-03 DIAGNOSIS — I5033 Acute on chronic diastolic (congestive) heart failure: Secondary | ICD-10-CM | POA: Diagnosis not present

## 2017-09-03 MED ORDER — SPIRONOLACTONE 25 MG PO TABS: 25.0000 mg | ORAL_TABLET | Freq: Every day | ORAL | 1 refills | Status: DC

## 2017-09-03 MED ORDER — FUROSEMIDE 40 MG PO TABS: ORAL_TABLET | ORAL | 1 refills | Status: DC

## 2017-09-03 NOTE — Progress Notes (Signed)
Cardiology Office Note    Date:  09/03/2017   ID:  Rebekah Johnson, DOB 04-25-1960, MRN [id number]  PCP:  Charlott Rakes, MD  Cardiologist: Dr. [name]   Chief Complaint: 2 weeks follow up  History of Present Illness:   Rebekah Johnson is a 57 y.o. female with HTN (patent renal arteries by PV angio 08/2015), uncontrolled DM2, hyperlipidemia, CKD stage III by labs, diabetic neuropathy, PVD (status post left SFA and popliteal stent and left SFA PTA with drug coated balloon in 08/2015), left foot transmetatarsal amputation, tobacco abuse, obesity presents for follow up.   Admitted 09/2016 with acute on chronic diastolic CHF, sec to noncompliance - out of medications for 3 days. Found to have acute hypoxic respiratory failure with O2 sats 70s requiring bipap.  Felt to have new onset CHF, also with troponin of 2. EF 55-60% G2DD. Cath showed 100% prox CX to mid CX stenosis, RCA with 80% stenosis other with nonobstructive as well. PCI would require multiple stents and significant contrast, recommendws to treat medically. UA concerning for nephrotic process. Outpatient referral to nephrology.    Admitted 01/01/17 with another acute on chronic diastolic CHF - and NSTEMI,  Maximum troponin 4.9, because of previous cath findings  and CKD stage III conservative management was decided. She was diuresed..   Seen by Dr. [name] 01/15/17. Noted volume overload. Increased lasix to 77m BID. May consider PCI once evaluated by nephrologist or worsening of symptoms. Imdur increased to 926mqd for elevated BP.   Seen by oncology 01/21/16 for abnormal protein electrophoresis.  She was found to have a 0.5 g/dL on a serum protein electrophoresis with IgG kappa immunofixation. Felt potential ramification of having MGUS and the risk of progression to multiple myeloma. Recommended obtaining a skeletal survey and repeat her laboratory testing in 6 months.  Bone marrow biopsy may be needed to complete the staging depending on  the findings  Patient was [facility]23/2017 [facility] severe [facility] distress/sepsis secondary to pneumonia.  She was treated with IV Rocephin and azithromycin.  BNP was elevated at 648 and she was given IV Lasix.  She was seen by cardiology because of elevated troponins-4.37.  She was felt to have a end STEMI secondary to demand ischemia.  She also had a hypertensive emergency.  She was given a Plavix load and managed medically due to renal function.  Metoprolol was stopped and Coreg started along with Norvasc.  Creatinine was 3.0 at discharge.  Urine drug screen was positive for marijuana.  Patient comes in for f/u. She is weak. Does a lot of fishing. Only has chest tightness if she tries to do too much. Hasn't seen renal in awhile. Chronic dyspnea on exertion. Quit cigarette smoking in July, still using marijuana.  Asking for referral for pulmonary because of recurrent respiratory failure and neurologist to manage her diabetes.  09/03/17 - 3 day follow up, she has PND and 3 pillow orthopnea, persistent LE edema, she states that she stopped smoking but smokes marijuana daily.  No chest pain.   Past Medical History:  Diagnosis Date  . CAD (coronary artery disease)    a. NSTEM 09/2016: cath showing severe diffuse disease of the RCA, ramus and Cx with mild-mod disease of LAD; PCI would require multiple stents and significant contrast usage thus medical therapy recommended.  . Chronic diastolic CH[place] ([place]) [place] [place]  . CKD ([place] disease), stage III ([place]  . Diabetes  mellitus with neuropathy ([place])   . Foot amputation status ([place])    a. h/o left foot transmetatarsal amputation  . High cholesterol   . Hypertension    a. patent renal arteries by PV angio 08/2015. b. heavy proteinuria 09/2016 ? nephrotic.  [name] Normocytic anemia   . Obesity   . Proteinuria   . PVD (peripheral vascular disease) ([place])    a. status post  left SFA and popliteal stent and left SFA PTA with drug coated balloon in 08/2015.  . Tobacco abuse    Past Surgical History:  Procedure Laterality Date  . ABDOMINAL HYSTERECTOMY    . AMPUTATION Left 09/06/2015   Procedure: LEFT TRANSMETATARSAL AMPUTATTION;  Surgeon: Newt Minion, MD;  Location: [facility];  Service: Orthopedics;  Laterality: Left;  . LEFT HEART CATH AND CORONARY ANGIOGRAPHY N/A 09/17/2016   Procedure: LEFT HEART CATH AND CORONARY ANGIOGRAPHY;  Surgeon: Jettie Booze, MD;  Location: [facility];  Service: Cardiovascular;  Laterality: N/A;  . PERIPHERAL VASCULAR CATHETERIZATION N/A 08/16/2015   Procedure: Abdominal Aortogram;  Surgeon: Elam Dutch, MD;  Location: [facility];  Service: Cardiovascular;  Laterality: N/A;  . PERIPHERAL VASCULAR CATHETERIZATION Bilateral 08/16/2015   Procedure: Lower Extremity Angiography;  Surgeon: Elam Dutch, MD;  Location: [facility] [facility];  Service: Cardiovascular;  Laterality: Bilateral;  . PERIPHERAL VASCULAR CATHETERIZATION Left 08/16/2015   Procedure: Peripheral Vascular Intervention;  Surgeon: Elam Dutch, MD;  Location: [facility];  Service: Cardiovascular;  Laterality: Left;  SFA STENT X 2   Current Medications: Prior to Admission medications   Medication Sig Start Date End Date Taking? Authorizing Provider  amLODipine (NORVASC) 10 MG tablet Take 1 tablet (10 mg total) by mouth daily. Reported on 07/13/2015 12/31/16 12/31/17  Arnoldo Morale, MD  aspirin 81 MG EC tablet Take 1 tablet (81 mg total) by mouth daily. Reported on 07/13/2015 07/02/16   Arnoldo Morale, MD  atorvastatin (LIPITOR) 80 MG tablet Take 1 tablet (80 mg total) by mouth daily. 12/31/16   Arnoldo Morale, MD  blood glucose meter kit and supplies KIT Dispense based on patient and insurance preference. Use up to four times daily as directed. (FOR ICD-9 250.00, 250.01). 07/25/15   Ward, Delice Bison, DO  Blood Glucose Monitoring Suppl (ACCU-CHEK  AVIVA) device Use as instructed three times daily. 04/22/16   Arnoldo Morale, MD  carvedilol (COREG) 25 MG tablet Take 1 tablet (25 mg total) by mouth 2 (two) times daily. 12/31/16 12/31/17  Arnoldo Morale, MD  Cholecalciferol (VITAMIN D-3) 1000 units CAPS Take 1,000 Units by mouth daily.     [provider]  clopidogrel (PLAVIX) 75 MG tablet Take 1 tablet (75 mg total) by mouth daily. 12/31/16   Arnoldo Morale, MD  docusate sodium (COLACE) 100 MG capsule TAKE ONE CAPSULE BY MOUTH TWICE A DAY 10/26/16   Arnoldo Morale, MD  furosemide (LASIX) 40 MG tablet Take 40 mg by mouth twice daily for 7 days only, then go back to taking 40 mg by mouth daily thereafter. 01/18/17   Dorothy Spark, MD  gabapentin (NEURONTIN) 300 MG capsule Take 1 capsule (300 mg total) by mouth 3 (three) times daily. 12/31/16   Arnoldo Morale, MD  glipiZIDE (GLUCOTROL) 10 MG tablet Take 1 tablet (10 mg total) by mouth 2 (two) times daily before a meal. 12/31/16   Arnoldo Morale, MD  glucose blood (ACCU-CHEK AVIVA) test strip Use as instructed three times daily before meals. 04/22/16  Arnoldo Morale, MD  hydrALAZINE (APRESOLINE) 100 MG tablet Take 1 tablet (100 mg total) by mouth 2 (two) times daily. 12/31/16   Arnoldo Morale, MD  isosorbide mononitrate (IMDUR) 30 MG 24 hr tablet Take 3 tablets (90 mg total) by mouth daily. 01/18/17   Dorothy Spark, MD  Lancet Devices [facility]) lancets Use as instructed three times daily before meals. 04/22/16   Arnoldo Morale, MD  nitroGLYCERIN (NITROSTAT) 0.4 MG SL tablet Place 1 tablet (0.4 mg total) under the tongue every 5 (five) minutes as needed for chest pain. 01/18/17 04/18/17  Dorothy Spark, MD  Omega-3 Fatty Acids (FISH OIL) 1000 MG CAPS Take 1,000 mg by mouth daily. Reported on 07/13/2015    [provider]  sitaGLIPtin (JANUVIA) 50 MG tablet Take 1 tablet (50 mg total) by mouth daily. 12/31/16   Arnoldo Morale, MD   Allergies:   Patient has no known allergies.     Social History   Socioeconomic History  . Marital status: Married    Spouse name: Not on file  . Number of children: Not on file  . Years of education: Not on file  . Highest education level: Not on file  Occupational History  . Not on file  Social Needs  . Financial resource strain: Not on file  . Food insecurity:    Worry: Not on file    Inability: Not on file  . Transportation needs:    Medical: Not on file    Non-medical: Not on file  Tobacco Use  . Smoking status: Former Smoker    Packs/day: 1.00    Years: 42.00    Pack years: 42.00    Types: Cigarettes    Last attempt to quit: 07/17/2017    Years since quitting: 0.1  . Smokeless tobacco: Never Used  Substance and Sexual Activity  . Alcohol use: Yes    Alcohol/week: 1.0 - 2.0 standard drinks    Types: 1 - 2 Glasses of wine per week    Comment: on social occassions  . Drug use: No  . Sexual activity: Not on file  Lifestyle  . Physical activity:    Days per week: Not on file    Minutes per session: Not on file  . Stress: Not on file  Relationships  . Social connections:    Talks on phone: Not on file    Gets together: Not on file    Attends religious service: Not on file    Active member of club or organization: Not on file    Attends meetings of clubs or organizations: Not on file    Relationship status: Not on file  Other Topics Concern  . Not on file  Social History Narrative  . Not on file    Family History:  The patient's family history includes Diabetes in her mother; Hypertension in her father.   ROS:   Please see the history of present illness.    ROS All other systems reviewed and are negative.  PHYSICAL EXAM:   VS:  BP 140/70   Pulse 82   Ht '5\' 4"'  (1.626 m)   Wt 221 lb (100.2 kg)   SpO2 99%   BMI 37.93 kg/m    GEN: Morbidly obese female in no acute distress  HEENT: normal  Neck: +  JVD. NO carotid bruits, or masses Cardiac: RRR; no murmurs, rubs, or gallops, crackles at the lung  bases, B/L LE edema Respiratory: Faint bibasilar rales GI: soft, nontender, nondistended, +  BS MS: no deformity or atrophy  Skin: warm and dry, no rash Neuro:  Alert and Oriented x 3, Strength and sensation are intact Psych: euthymic mood, full affect  Wt Readings from Last 3 Encounters:  09/03/17 221 lb (100.2 kg)  09/01/17 214 lb 3.2 oz (97.2 kg)  08/31/17 213 lb 6.4 oz (96.8 kg)      Studies/Labs Reviewed:   EKG:  EKG is not  ordered today.    Recent Labs: 09/14/2016: TSH 0.959 01/01/2017: Magnesium 1.7 05/28/2017: NT-Pro BNP 1,155 06/04/2017: B Natriuretic Peptide 273.1 07/20/2017: ALT 27; BUN 36; Creatinine 3.17; Hemoglobin 10.8; Platelet Count 229; Potassium 3.9; Sodium 137   Lipid Panel    Component Value Date/Time   CHOL 119 08/06/2017 0920   TRIG 307 (H) 08/06/2017 0920   HDL 30 (L) 08/06/2017 0920   CHOLHDL 4.0 08/06/2017 0920   CHOLHDL 3.7 09/16/2016 0559   VLDL 33 09/16/2016 0559   LDLCALC 28 08/06/2017 0920    Additional studies/ records that were reviewed today include:   LEFT HEART CATH AND CORONARY ANGIOGRAPHY  Conclusion     Ost 1st Diag lesion, 25 %stenosed.  Ost 2nd Diag lesion, 25 %stenosed.  Ramus-1 lesion, 50 %stenosed.  Ramus-2 lesion, 80 %stenosed.  Prox Cx to Mid Cx lesion, 100 %stenosed.  Prox RCA to Mid RCA lesion, 80 %stenosed.  RPDA lesion, 80 %stenosed.  Dist RCA lesion, 80 %stenosed.  Mid RCA to Dist RCA lesion, 80 %stenosed.  LV end diastolic pressure is mildly elevated.  There is no aortic valve stenosis.  Severe diffuse disease of the RCA, Ramus and circumflex. LAD with only mild to moderate diffuse disease. PCI would require multiple stents and significant contrast usage. For now, would treat medically.     Echo 09/15/16 Study Conclusions  - Left ventricle: The cavity size was normal. Wall thickness was increased in a pattern of mild LVH. Systolic function was normal. The estimated ejection  fraction was in the range of 55% to 60%. There is akinesis of the basal-midinferolateral myocardium. Features are consistent with a pseudonormal left ventricular filling pattern, with concomitant abnormal relaxation and increased filling pressure (grade 2 diastolic dysfunction). Doppler parameters are consistent with high ventricular filling pressure. - Mitral valve: There was mild regurgitation. - Left atrium: The atrium was mildly dilated.  Impressions:  - Akinesis of the basal/mid inferolateral wall with overall preserved LV systolic function; moderate diastolic dysfunction; mild LVH; mild MR; mild LAE.  TTE: 09/02/17 - Left ventricle: The cavity size was normal. There was mild   concentric hypertrophy. Systolic function was at the lower limits   of normal. The estimated ejection fraction was in the range of   50% to 55%. Severe hypokinesis and scarring of the   basal-midinferolateral myocardium; consistent with infarction in   the distribution of the left circumflex coronary artery. Features   are consistent with a pseudonormal left ventricular filling   pattern, with concomitant abnormal relaxation and increased   filling pressure (grade 2 diastolic dysfunction). - Mitral valve: There was moderate regurgitation directed   eccentrically and posteriorly. - Left atrium: The atrium was mildly to moderately dilated.  Impressions:  - Compared to September 2018, the MR appears more significant.   ASSESSMENT & PLAN:   1. Acute on chronic diastolic CHF -weight up 7 lbs in the last 3 days, crackles, LE edema - I will increase lasix to 80-40 mg and add spironolactone 25 mg po daily - follow up in 4 weeks with BMP  and BNP  2. CAD  - CAD extensive on cath 09/2016 but has been managed medically because of CKD and need for multiple stents.  Recent N STEMI 08/23/2017 in the setting of respiratory failure, pneumonia, CHF [place] with aspirin, Plavix, statin, Imdur and beta-blocker.  3. Hypertensive heart disease -increase diuretics as above.  4. PVD - on Plavix  5. CKD stage III -Followed by nephrologist  6. Tobacco smoking -She has quit in July 2019, continues daily marijuana.  Follow-up in 4 weeks.   Medication Adjustments/Labs and Tests Ordered: Current medicines are reviewed at length with the patient today.  Concerns regarding medicines are outlined above.  Medication changes, Labs and Tests ordered today are listed in the Patient Instructions below. Patient Instructions  Medication Instructions:  .Your physician has recommended you make the following change in your medication:       Medication Adjustments/Labs and Tests Ordered: Current medicines are reviewed at length with the patient today.  Concerns regarding medicines are outlined above.  Medication changes, Labs and Tests ordered today are listed in the Patient Instructions below. Patient Instructions  Medication Instructions:   INCREASE YOUR LASIX TO TAKING 80 MG BY MOUTH AT 8 AM, THEN TAKE 40 MG BY MOUTH AT 2 PM EVERYDAY  START TAKING SPIRONOLACTONE 25 MG ONCE DAILY    Labwork:  IN 4 WEEKS--SAME DAY AS YOU SEE MICHELLE LENZE PA-C--WILL CHECK--BMET AND PRO-BNP     Follow-Up:  WITH MICHELLE LENZE PA-C IN 4 WEEKS--YOU WILL HAVE LAB DONE SAME DAY       If you need a refill on your cardiac medications before your next appointment, please call your pharmacy.      Signed, Ena Dawley, MD  09/03/2017 8:51 AM    [address], [place], [place]  [address] Phone: [phone]; Fax: [phone]

## 2017-09-03 NOTE — Patient Instructions (Signed)
Medication Instructions:   INCREASE YOUR LASIX TO TAKING 80 MG BY MOUTH AT 8 AM, THEN TAKE 40 MG BY MOUTH AT 2 PM EVERYDAY  START TAKING SPIRONOLACTONE 25 MG ONCE DAILY    Labwork:  IN 4 WEEKS--SAME DAY AS YOU SEE MICHELLE LENZE PA-C--WILL CHECK--BMET AND PRO-BNP     Follow-Up:  WITH MICHELLE LENZE PA-C IN 4 WEEKS--YOU WILL HAVE LAB DONE SAME DAY       If you need a refill on your cardiac medications before your next appointment, please call your pharmacy.

## 2017-09-12 ENCOUNTER — Encounter (HOSPITAL_BASED_OUTPATIENT_CLINIC_OR_DEPARTMENT_OTHER): Payer: Medicaid Other

## 2017-09-18 ENCOUNTER — Other Ambulatory Visit: Payer: Self-pay | Admitting: Physician Assistant

## 2017-09-21 ENCOUNTER — Inpatient Hospital Stay: Payer: Medicaid Other | Admitting: Family Medicine

## 2017-09-23 ENCOUNTER — Encounter: Payer: Self-pay | Admitting: Physician Assistant

## 2017-10-04 NOTE — Progress Notes (Signed)
Cardiology Office Note    Date:  10/05/2017   ID:  Rebekah Johnson, DOB 06/28/1960, MRN [id number]  PCP:  Rebekah Rakes, MD  Cardiologist: Rebekah Dawley, MD EPS: None  Chief Complaint  Patient presents with  . Follow-up    History of Present Illness:  Rebekah Johnson is a 57 y.o. female with history of CAD status post NSTEMI 09/2016 with severe diffuse disease of the RCA, ramus and circumflex with mild to moderate disease of the LAD.  Medical therapy was recommended because multiple stents and significant and contrast would have to be used.  Also has chronic diastolic CHF, CKD stage III, DM with neuropathy and foot amputation, hypertension, HLD, PVD status post left SFA and popliteal stent and left SFA PTA with drug-coated balloon 08/2015, tobacco abuse.   Readmitted with NSTEMI and acute on chronic CHF 12/2016 managed medically.  .   Patient was hospitalized 08/23/2017 through 08/27/2017 at [facility] in [place], [place]  for severe respiratory distress/sepsis secondary to pneumonia.  She was treated with IV Rocephin and azithromycin.  BNP was elevated at 648 and she was given IV Lasix.  She was seen by cardiology because of elevated troponins-4.37.  She was felt to have an NSTEMI secondary to demand ischemia.  She also had a hypertensive emergency.  She was given a Plavix load and managed medically due to renal function.  Metoprolol was stopped and Coreg started along with Norvasc.  Creatinine was 3.0 at discharge.  Urine drug screen was positive for marijuana.   I saw her in follow-up 08/31/17 in which she was still having chest pain so added Ranexa 500 mg twice daily.  Also referred her to endocrinologist and pulmonary.  2D echo 09/02/2017 showed normal LV systolic function 50 to 74% with severe hypokinesis and scarring of the basal mid inferior lateral myocardium, grade 2 DD with moderate MR.  Saw Dr. Meda Johnson 09/03/2017 (3 days later)at which time she was up 7 pounds.  Lasix was  increased to 80 mg and 40 in the evening and Spironolactone 25 mg once daily was added.  Patient was in [place] hospital again 9/3 with pulmonary edema/repiratory failure on ventilator for 4 days. Cardiac arrest with asystole in ER successful resuscitation hypothermia protocol.  They were not sure whether or not she had an arrhythmia that preclude occluded this.  Echo severe HK and AK inf/lat wall with MR, septic shock, renal failure. An echocardiogram performed 09/08/17 showed borderline reduced left ventricular systolic function with EF of 49% and severely hypokinetic to akinetic inferior and lateral wall of the left ventricle with mitral regurgitation.   Notes say she would need a cardiac catheterization once she stabilized but patient was adamant about being treated at [facility].  She did see renal last week.  We will call to get her most recent creatinine.  Went to a rehab facility for a week.  Since she has been home she complains of orthopnea and has to sleep propped up.  She is scheduled for a sleep study later this month.   Past Medical History:  Diagnosis Date  . CAD (coronary artery disease)    a. NSTEM 09/2016: cath showing severe diffuse disease of the RCA, ramus and Cx with mild-mod disease of LAD; PCI would require multiple stents and significant contrast usage thus medical therapy recommended.  . Chronic diastolic CHF (congestive heart failure) ([place])   . CKD (chronic kidney disease), stage III ([place])   . Diabetes mellitus with neuropathy ([place])   .  Foot amputation status    a. h/o left foot transmetatarsal amputation  . High cholesterol   . Hypertension    a. patent renal arteries by PV angio 08/2015. b. heavy proteinuria 09/2016 ? nephrotic.  Rebekah Johnson Normocytic anemia   . Obesity   . Proteinuria   . PVD (peripheral vascular disease) ([place])    a. status post left SFA and popliteal stent and left SFA PTA with drug coated balloon in 08/2015.  . Tobacco abuse     Past Surgical History:    Procedure Laterality Date  . ABDOMINAL HYSTERECTOMY    . AMPUTATION Left 09/06/2015   Procedure: LEFT TRANSMETATARSAL AMPUTATTION;  Surgeon: Rebekah Minion, MD;  Location: [place] [place];  Service: Orthopedics;  Laterality: Left;  . LEFT HEART CATH AND CORONARY ANGIOGRAPHY N/A 09/17/2016   Procedure: LEFT HEART CATH AND CORONARY ANGIOGRAPHY;  Surgeon: Rebekah Booze, MD;  Location: [place] [facility];  Service: Cardiovascular;  Laterality: N/A;  . PERIPHERAL VASCULAR CATHETERIZATION N/A 08/16/2015   Procedure: Abdominal Aortogram;  Surgeon: Rebekah Dutch, MD;  Location: [place] [facility];  Service: Cardiovascular;  Laterality: N/A;  . PERIPHERAL VASCULAR CATHETERIZATION Bilateral 08/16/2015   Procedure: Lower Extremity Angiography;  Surgeon: Rebekah Dutch, MD;  Location: [place] [facility];  Service: Cardiovascular;  Laterality: Bilateral;  . PERIPHERAL VASCULAR CATHETERIZATION Left 08/16/2015   Procedure: Peripheral Vascular Intervention;  Surgeon: Rebekah Dutch, MD;  Location: [place] [facility];  Service: Cardiovascular;  Laterality: Left;  SFA STENT X 2    Current Medications: Current Meds  Medication Sig  . amLODipine (NORVASC) 10 MG tablet Take 1 tablet (10 mg total) by mouth daily. Reported on 07/13/2015  . aspirin 81 MG EC tablet Take 1 tablet (81 mg total) by mouth daily. Reported on 07/13/2015  . atorvastatin (LIPITOR) 80 MG tablet Take 1 tablet (80 mg total) by mouth daily.  . blood glucose meter kit and supplies KIT Dispense based on patient and insurance preference. Use up to four times daily as directed. (FOR ICD-9 250.00, 250.01).  . Blood Glucose Monitoring Suppl (ACCU-CHEK AVIVA) device Use as instructed three times daily.  . carvedilol (COREG) 25 MG tablet Take 1 tablet (25 mg total) by mouth 2 (two) times daily.  . Cholecalciferol (VITAMIN D-3) 1000 units CAPS Take 1,000 Units by mouth daily.   . clopidogrel (PLAVIX) 75 MG tablet Take 1 tablet (75 mg total) by mouth daily.   Rebekah Johnson docusate sodium (COLACE) 100 MG capsule TAKE ONE CAPSULE BY MOUTH TWICE A DAY  . furosemide (LASIX) 40 MG tablet Take 2 tabs (80 mg total) by mouth at 8 am, then take 1 tab (40 mg total) by mouth at 2 pm everyday.  . gabapentin (NEURONTIN) 300 MG capsule Take 1 capsule (300 mg total) by mouth 3 (three) times daily.  Rebekah Johnson glipiZIDE (GLUCOTROL) 10 MG tablet Take 1 tablet (10 mg total) by mouth 2 (two) times daily before a meal.  . glucose blood (ACCU-CHEK AVIVA) test strip Use as instructed three times daily before meals.  . hydrALAZINE (APRESOLINE) 50 MG tablet Take 50 mg by mouth 2 (two) times daily.  . isosorbide mononitrate (IMDUR) 30 MG 24 hr tablet Take 3 tablets (90 mg total) by mouth daily.  Elmore Guise Devices (ACCU-CHEK SOFTCLIX) lancets Use as instructed three times daily before meals.  . latanoprost (XALATAN) 0.005 % ophthalmic solution 1 drop at bedtime.  . Omega-3 Fatty Acids (FISH OIL) 1000 MG CAPS Take 1,000 mg by  mouth daily. Reported on 07/13/2015  . ranolazine (RANEXA) 500 MG 12 hr tablet Take 1 tablet (500 mg total) by mouth 2 (two) times daily.  . sitaGLIPtin (JANUVIA) 25 MG tablet Take 1 tablet (25 mg total) by mouth daily.  . [DISCONTINUED] hydrALAZINE (APRESOLINE) 50 MG tablet TAKE 1.5 TABLETS (75 MG TOTAL) BY MOUTH 3 (THREE) TIMES DAILY.     Allergies:   Patient has no known allergies.   Social History   Socioeconomic History  . Marital status: Married    Spouse name: Not on file  . Number of children: Not on file  . Years of education: Not on file  . Highest education level: Not on file  Occupational History  . Not on file  Social Needs  . Financial resource strain: Not on file  . Food insecurity:    Worry: Not on file    Inability: Not on file  . Transportation needs:    Medical: Not on file    Non-medical: Not on file  Tobacco Use  . Smoking status: Former Smoker    Packs/day: 1.00    Years: 42.00    Pack years: 42.00    Types: Cigarettes    Last attempt  to quit: 07/17/2017    Years since quitting: 0.2  . Smokeless tobacco: Never Used  Substance and Sexual Activity  . Alcohol use: Yes    Alcohol/week: 1.0 - 2.0 standard drinks    Types: 1 - 2 Glasses of wine per week    Comment: on social occassions  . Drug use: No  . Sexual activity: Not on file  Lifestyle  . Physical activity:    Days per week: Not on file    Minutes per session: Not on file  . Stress: Not on file  Relationships  . Social connections:    Talks on phone: Not on file    Gets together: Not on file    Attends religious service: Not on file    Active member of club or organization: Not on file    Attends meetings of clubs or organizations: Not on file    Relationship status: Not on file  Other Topics Concern  . Not on file  Social History Narrative  . Not on file     Family History:  The patient's family history includes Diabetes in her mother; Hypertension in her father.   ROS:   Please see the history of present illness.    Review of Systems  Constitution: Negative.  HENT: Negative.   Eyes: Negative.   Cardiovascular: Negative.   Respiratory: Positive for sleep disturbances due to breathing and snoring.   Hematologic/Lymphatic: Negative.   Musculoskeletal: Negative.  Negative for joint pain.  Gastrointestinal: Positive for constipation.  Genitourinary: Negative.   Neurological: Negative.    All other systems reviewed and are negative.   PHYSICAL EXAM:   VS:  BP 140/60   Pulse 78   Ht _0  (1.626 m)   Wt 196 lb 12.8 oz (89.3 kg)   SpO2 99%   BMI 33.78 kg/m   Physical Exam  GEN: Well nourished, well developed, in no acute distress  Neck: no JVD, carotid bruits, or masses Cardiac:RRR; no murmurs, rubs, or gallops  Respiratory:  clear to auscultation bilaterally, normal work of breathing GI: soft, nontender, nondistended, + BS Ext: without cyanosis, clubbing, or edema, Good distal pulses bilaterally Neuro:  Alert and Oriented x 3, Strength and  sensation are intact Psych: euthymic mood, full affect  Wt Readings from Last 3 Encounters:  10/05/17 196 lb 12.8 oz (89.3 kg)  09/03/17 221 lb (100.2 kg)  09/01/17 214 lb 3.2 oz (97.2 kg)      Studies/Labs Reviewed:   EKG:  EKG is not ordered today.   Recent Labs: 01/01/2017: Magnesium 1.7 05/28/2017: NT-Pro BNP 1,155 06/04/2017: B Natriuretic Peptide 273.1 07/20/2017: ALT 27; BUN 36; Creatinine 3.17; Hemoglobin 10.8; Platelet Count 229; Potassium 3.9; Sodium 137   Lipid Panel    Component Value Date/Time   CHOL 119 08/06/2017 0920   TRIG 307 (H) 08/06/2017 0920   HDL 30 (L) 08/06/2017 0920   CHOLHDL 4.0 08/06/2017 0920   CHOLHDL 3.7 09/16/2016 0559   VLDL 33 09/16/2016 0559   LDLCALC 28 08/06/2017 0920    Additional studies/ records that were reviewed today include:   2D echo [facility] 09/08/2017 An echocardiogram 09/08/17 showed borderline reduced left ventricular systolic function with EF of 49% and severely hypokinetic to akinetic inferior and lateral wall of the left ventricle with mitral regurgitation.   2D echo 09/02/2017 Study Conclusions   - Left ventricle: The cavity size was normal. There was mild   concentric hypertrophy. Systolic function was at the lower limits   of normal. The estimated ejection fraction was in the range of   50% to 55%. Severe hypokinesis and scarring of the   basal-midinferolateral myocardium; consistent with infarction in   the distribution of the left circumflex coronary artery. Features   are consistent with a pseudonormal left ventricular filling   pattern, with concomitant abnormal relaxation and increased   filling pressure (grade 2 diastolic dysfunction). - Mitral valve: There was moderate regurgitation directed   eccentrically and posteriorly. - Left atrium: The atrium was mildly to moderately dilated.   Impressions:   - Compared to September 2018, the MR appears more significant.      ASSESSMENT:    1. Cardiac  arrest ([place])   2. Chronic diastolic CHF (congestive heart failure) ([place])   3. Coronary artery disease involving native coronary artery of native heart without angina pectoris   4. Essential (primary) hypertension   5. CKD (chronic kidney disease), stage IV ([place])   6. Mixed hyperlipidemia   7. Tobacco abuse      PLAN:  In order of problems listed above:  Asystole cardiac arrest 09/07/2017 [place] in the setting of pulmonary edema/respiratory failure with question of pneumonia and septic shock.  Patient was intubated for 4 days.  2D echo LVEF 49% with severe hypokinesis to akinesis of the inferior and lateral walls of the LV with mitral regurgitation.  This is not significantly different than the echo we did 08/2017.  Patient without angina.  Also had acute renal failure.  They recommended cardiac catheterization at some point.  Creatinine 3.35 at [facility] last week.  Will review with Dr. Meda Johnson to see what further work-up she would like to proceed with.  Chronic diastolic CHF with recent exacerbation with increase in diuretics to 80 mg in the a.m. 40 in the p.m., Spironolactone added but is not currently taking because of renal function.  CAD status post NSTEMI 09/2016 with severe diffuse disease in the RCA, ramus and circumflex with mild to moderate disease in the LAD medical therapy recommended.  NSTEMI 08/23/17 [facility] in [place] in the setting of severe respiratory distress/sepsis secondary to pneumonia and CHF.  2D echo LVEF low normal  Essential hypertension blood pressure controlled  CKD stage IV  creatinine was 3.17 07/20/2017 was checked at [facility] last week creatinine 3.35   Hyperlipidemia LDL 28 triglycerides 307  Tobacco abuse quit smoking but smokes marijuana daily   Medication Adjustments/Labs and Tests Ordered: Current medicines are reviewed at length with the patient today.  Concerns regarding  medicines are outlined above.  Medication changes, Labs and Tests ordered today are listed in the Patient Instructions below. Patient Instructions  Medication Instructions:  Your physician recommends that you continue on your current medications as directed. Please refer to the Current Medication list given to you today.  Labwork: We will request a copy of your labs from [facility]  Testing/Procedures: Your physician has recommended that you wear an event monitor. Event monitors are medical devices that record the heart's electrical activity. Doctors most often [place] these monitors to diagnose arrhythmias. Arrhythmias are problems with the speed or rhythm of the heartbeat. The monitor is a small, portable device. You can wear one while you do your normal daily activities. This is usually used to diagnose what is causing palpitations/syncope (passing out).  Follow-Up: Your physician recommends that you schedule a follow-up with Ermalinda Barrios, PA on 11/23/17 at 9:00 AM.   Any Other Special Instructions Will Be Listed Below (If Applicable).     If you need a refill on your cardiac medications before your next appointment, please call your pharmacy.     Sumner Boast, PA-C  10/05/2017 3:00 PM    [facility] [address], [place], [place]  [address] Phone: [phone]; Fax: [phone]

## 2017-10-05 ENCOUNTER — Encounter (INDEPENDENT_AMBULATORY_CARE_PROVIDER_SITE_OTHER): Payer: Self-pay

## 2017-10-05 ENCOUNTER — Ambulatory Visit (INDEPENDENT_AMBULATORY_CARE_PROVIDER_SITE_OTHER): Payer: Medicaid Other | Admitting: Physician Assistant

## 2017-10-05 ENCOUNTER — Encounter: Payer: Self-pay | Admitting: Physician Assistant

## 2017-10-05 ENCOUNTER — Other Ambulatory Visit: Payer: Medicaid Other

## 2017-10-05 VITALS — BP 140/60 | HR 78 | Ht 64.0 in | Wt 196.8 lb

## 2017-10-05 DIAGNOSIS — I10 Essential (primary) hypertension: Secondary | ICD-10-CM | POA: Diagnosis not present

## 2017-10-05 DIAGNOSIS — I469 Cardiac arrest, cause unspecified: Secondary | ICD-10-CM

## 2017-10-05 DIAGNOSIS — N184 Chronic kidney disease, stage 4 (severe): Secondary | ICD-10-CM

## 2017-10-05 DIAGNOSIS — I5032 Chronic diastolic (congestive) heart failure: Secondary | ICD-10-CM

## 2017-10-05 DIAGNOSIS — E782 Mixed hyperlipidemia: Secondary | ICD-10-CM

## 2017-10-05 DIAGNOSIS — Z720 Tobacco use: Secondary | ICD-10-CM

## 2017-10-05 DIAGNOSIS — I2510 Atherosclerotic heart disease of native coronary artery without angina pectoris: Secondary | ICD-10-CM

## 2017-10-05 NOTE — Patient Instructions (Signed)
Medication Instructions:  Your physician recommends that you continue on your current medications as directed. Please refer to the Current Medication list given to you today.  Labwork: We will request a copy of your labs from [facility]  Testing/Procedures: Your physician has recommended that you wear an event monitor. Event monitors are medical devices that record the heart's electrical activity. Doctors most often [place] these monitors to diagnose arrhythmias. Arrhythmias are problems with the speed or rhythm of the heartbeat. The monitor is a small, portable device. You can wear one while you do your normal daily activities. This is usually used to diagnose what is causing palpitations/syncope (passing out).  Follow-Up: Your physician recommends that you schedule a follow-up with Ermalinda Barrios, PA on 11/23/17 at 9:00 AM.   Any Other Special Instructions Will Be Listed Below (If Applicable).     If you need a refill on your cardiac medications before your next appointment, please call your pharmacy.

## 2017-10-06 ENCOUNTER — Other Ambulatory Visit: Payer: Self-pay

## 2017-10-06 ENCOUNTER — Ambulatory Visit (INDEPENDENT_AMBULATORY_CARE_PROVIDER_SITE_OTHER): Payer: Medicaid Other

## 2017-10-06 ENCOUNTER — Other Ambulatory Visit: Payer: Self-pay | Admitting: Interventional Cardiology

## 2017-10-06 DIAGNOSIS — I5032 Chronic diastolic (congestive) heart failure: Secondary | ICD-10-CM

## 2017-10-06 DIAGNOSIS — I469 Cardiac arrest, cause unspecified: Secondary | ICD-10-CM | POA: Diagnosis not present

## 2017-10-07 ENCOUNTER — Inpatient Hospital Stay: Payer: Medicaid Other

## 2017-10-07 ENCOUNTER — Ambulatory Visit: Payer: Medicaid Other | Attending: Family Medicine | Admitting: Family Medicine

## 2017-10-07 VITALS — BP 150/77 | HR 72 | Temp 98.5°F | Resp 17 | Ht 64.0 in | Wt 194.2 lb

## 2017-10-07 DIAGNOSIS — I130 Hypertensive heart and chronic kidney disease with heart failure and stage 1 through stage 4 chronic kidney disease, or unspecified chronic kidney disease: Secondary | ICD-10-CM

## 2017-10-07 DIAGNOSIS — E785 Hyperlipidemia, unspecified: Secondary | ICD-10-CM

## 2017-10-07 DIAGNOSIS — I5033 Acute on chronic diastolic (congestive) heart failure: Secondary | ICD-10-CM

## 2017-10-07 DIAGNOSIS — E1151 Type 2 diabetes mellitus with diabetic peripheral angiopathy without gangrene: Secondary | ICD-10-CM | POA: Diagnosis not present

## 2017-10-07 DIAGNOSIS — E1149 Type 2 diabetes mellitus with other diabetic neurological complication: Secondary | ICD-10-CM

## 2017-10-07 DIAGNOSIS — I10 Essential (primary) hypertension: Secondary | ICD-10-CM | POA: Diagnosis not present

## 2017-10-07 DIAGNOSIS — I2510 Atherosclerotic heart disease of native coronary artery without angina pectoris: Secondary | ICD-10-CM

## 2017-10-07 DIAGNOSIS — Z8674 Personal history of sudden cardiac arrest: Secondary | ICD-10-CM | POA: Diagnosis not present

## 2017-10-07 DIAGNOSIS — I5032 Chronic diastolic (congestive) heart failure: Secondary | ICD-10-CM | POA: Diagnosis not present

## 2017-10-07 DIAGNOSIS — E1165 Type 2 diabetes mellitus with hyperglycemia: Secondary | ICD-10-CM

## 2017-10-07 DIAGNOSIS — E1122 Type 2 diabetes mellitus with diabetic chronic kidney disease: Secondary | ICD-10-CM | POA: Diagnosis not present

## 2017-10-07 DIAGNOSIS — I340 Nonrheumatic mitral (valve) insufficiency: Secondary | ICD-10-CM | POA: Diagnosis not present

## 2017-10-07 DIAGNOSIS — I739 Peripheral vascular disease, unspecified: Secondary | ICD-10-CM

## 2017-10-07 DIAGNOSIS — IMO0002 Reserved for concepts with insufficient information to code with codable children: Secondary | ICD-10-CM

## 2017-10-07 DIAGNOSIS — N184 Chronic kidney disease, stage 4 (severe): Secondary | ICD-10-CM

## 2017-10-07 DIAGNOSIS — Z7902 Long term (current) use of antithrombotics/antiplatelets: Secondary | ICD-10-CM | POA: Insufficient documentation

## 2017-10-07 DIAGNOSIS — L853 Xerosis cutis: Secondary | ICD-10-CM

## 2017-10-07 DIAGNOSIS — I70209 Unspecified atherosclerosis of native arteries of extremities, unspecified extremity: Secondary | ICD-10-CM

## 2017-10-07 LAB — POCT GLYCOSYLATED HEMOGLOBIN (HGB A1C): HEMOGLOBIN A1C: 6.8 % — AB (ref 4.0–5.6)

## 2017-10-07 LAB — GLUCOSE, POCT (MANUAL RESULT ENTRY): POC GLUCOSE: 285 mg/dL — AB (ref 70–99)

## 2017-10-07 MED ORDER — COLLAGENASE 250 UNIT/GM EX OINT: 1.0000 "application " | TOPICAL_OINTMENT | CUTANEOUS | 1 refills | Status: DC | PRN

## 2017-10-07 MED ORDER — AMLODIPINE BESYLATE 10 MG PO TABS: 10.0000 mg | ORAL_TABLET | Freq: Every day | ORAL | 2 refills | Status: DC

## 2017-10-07 MED ORDER — GABAPENTIN 300 MG PO CAPS: 300.0000 mg | ORAL_CAPSULE | Freq: Three times a day (TID) | ORAL | 3 refills | Status: DC

## 2017-10-07 MED ORDER — HYDRALAZINE HCL 50 MG PO TABS: 100.0000 mg | ORAL_TABLET | Freq: Two times a day (BID) | ORAL | 1 refills | Status: DC

## 2017-10-07 MED ORDER — CARVEDILOL 25 MG PO TABS: 25.0000 mg | ORAL_TABLET | Freq: Two times a day (BID) | ORAL | 2 refills | Status: DC

## 2017-10-07 MED ORDER — ISOSORBIDE MONONITRATE ER 30 MG PO TB24: 90.0000 mg | ORAL_TABLET | Freq: Every day | ORAL | 1 refills | Status: DC

## 2017-10-07 MED ORDER — GLIPIZIDE 10 MG PO TABS: 10.0000 mg | ORAL_TABLET | Freq: Two times a day (BID) | ORAL | 1 refills | Status: DC

## 2017-10-07 MED ORDER — CLOPIDOGREL BISULFATE 75 MG PO TABS: 75.0000 mg | ORAL_TABLET | Freq: Every day | ORAL | 1 refills | Status: DC

## 2017-10-07 MED ORDER — FUROSEMIDE 40 MG PO TABS: ORAL_TABLET | ORAL | 1 refills | Status: DC

## 2017-10-07 MED ORDER — SITAGLIPTIN PHOSPHATE 25 MG PO TABS: 25.0000 mg | ORAL_TABLET | Freq: Every day | ORAL | 3 refills | Status: DC

## 2017-10-07 MED ORDER — ATORVASTATIN CALCIUM 80 MG PO TABS: 80.0000 mg | ORAL_TABLET | Freq: Every day | ORAL | 2 refills | Status: DC

## 2017-10-07 MED ORDER — DERMABASE EX CREA: TOPICAL_CREAM | CUTANEOUS | 1 refills | Status: DC

## 2017-10-07 NOTE — Progress Notes (Signed)
Rebekah Johnson, is a 57 y.o. female  VOH:[id number]  IRS:[id number]  DOB - 11/29/1957  CC: No chief complaint on file.      HPI: Rebekah Johnson is a 57 y.o. female is here today to establish care. Previous medical provider and medical care in [place].   Chronic health problems include: CKD IV, DM2, CAD, diastolic heart failure, cardiac arrest x 2, sepsis.  Rebekah Johnson presented to [facility] on 09/16/2017 via EMS as she had suffered cardiac arrest.  She was successfully resuscitated found to be in left particular failure and lateral wall with mitral regurgitation.  2D echo 09/02/2017 showed normal LV systolic function 50 to 00% with severe hypokinesis and scarring of the basal mid inferior lateral myocardium, grade 2 DD with moderate MR. Echocardiogram performed 09/08/17 showed borderline reduced left ventricular systolic function with EF of 49% and severely hypokinetic to akinetic inferior and lateral wall of the left ventricle with mitral regurgitation.She was in fluid overload had a elevated BNP 537 .  She was diuresed with IV Lasix she had elevated troponins.  CXR was significant for pulmonary edema. Urine drug screen positive for marijuana.  She required intubation and was successfully extubated. She was treated empirically for pneumonia. Renal function test abnormal with creatinine 3.8. She was hypertensive thoughout hospital stay required IV hydralazine. She was resumed on prior home medication carvedilol and Norvasc. D/C to impatient rehabilitation due to deconditioning. Recommendations included referral to nephrology and cardiology. On 10/05/2017, she established with cardiology, at [facility].   Today's visit Scheduled for sleep study on 10/28/17. Reports chest pain has resolved current therapy with Ranexa.  She is weighing herself daily along with the assistance of a home health nurse and has not noted any point weight gain.  Continues to experience fatigue with exertion  along with some shortness of breath.  She also has a pressure ulcer located at the sacrum region is being managed by home wound care.  She denies any pain or drainage site.  She has seen nephrology and they will follow-up with her within the next 4 weeks as they are likely planning to have a fistula placed the event patient may need dialysis in the future.  Plan for renal function is to avoid any nephrotoxic medications and good blood pressure control.  Blood pressures been taken at least 3 times per week at home health visits.  She denies any bleeding episodes as she is on Plavix and aspirin for high platelet therapy. She reports good diabetes control is compliant with medications.  Last A1c was 7.4 in August.   She denies any new weakness, headaches, nausea, vomiting,   Current medications:No current outpatient medications on file.   Pertinent family medical history: family history is not on file.  Allergies not on file  Social History   Socioeconomic History  . Marital status: Unknown    Spouse name: Not on file  . Number of children: Not on file  . Years of education: Not on file  . Highest education level: Not on file  Occupational History  . Not on file  Social Needs  . Financial resource strain: Not on file  . Food insecurity:    Worry: Not on file    Inability: Not on file  . Transportation needs:    Medical: Not on file    Non-medical: Not on file  Tobacco Use  . Smoking status: Not on file  Substance and Sexual Activity  . Alcohol use: Not on file  .  Drug use: Not on file  . Sexual activity: Not on file  Lifestyle  . Physical activity:    Days per week: Not on file    Minutes per session: Not on file  . Stress: Not on file  Relationships  . Social connections:    Talks on phone: Not on file    Gets together: Not on file    Attends religious service: Not on file    Active member of club or organization: Not on file    Attends meetings of clubs or organizations:  Not on file    Relationship status: Not on file  . Intimate partner violence:    Fear of current or ex partner: Not on file    Emotionally abused: Not on file    Physically abused: Not on file    Forced sexual activity: Not on file  Other Topics Concern  . Not on file  Social History Narrative  . Not on file    Review of Systems: Constitutional: Negative for fever, chills, diaphoresis, activity change, appetite change. Positive fatigue. HENT: Negative for ear pain, nosebleeds, congestion, facial swelling, rhinorrhea, neck pain, neck stiffness and ear discharge.  Eyes: Negative for pain, discharge, redness, itching and visual disturbance. Respiratory: Negative for cough, choking, chest tightness, positive for shortness of breath with exertion.  Cardiovascular: Negative for chest pain, palpitations and leg swelling. Gastrointestinal: Negative for abdominal distention.  Genitourinary: Negative for dysuria, urgency, frequency, hematuria, flank pain, decreased urine volume Musculoskeletal: Negative for back pain, joint swelling, arthralgia and gait problem. Neurological: Negative for dizziness, tremors, seizures, syncope, facial asymmetry, speech difficulty, weakness, light-headedness, numbness and headaches.  Integumentary: Positive for pressure ulcer Psychiatric/Behavioral: Negative for hallucinations, behavioral problems, confusion, dysphoric mood, decreased concentration and agitation.    Objective:  Blood pressure (!) 150/77, pulse 72, temperature 98.5 F (36.9 C), temperature source Oral, resp. rate 17, height 5\' 4"  (1.626 m), weight 194 lb 3.2 oz (88.1 kg), SpO2 95 %.  Wt Readings from Last 3 Encounters:  10/07/17 194 lb 3.2 oz (88.1 kg)  10/05/17 196 lb 12.8 oz (89.3 kg)  09/03/17 221 lb (100.2 kg)    Physical Exam: Constitutional: Patient appears well-developed and well-nourished. No distress. HENT: Normocephalic, atraumatic, External right and left ear normal. Oropharynx  is clear and moist.  Eyes: Conjunctivae and EOM are normal. PERRLA, no scleral icterus. Neck: Normal ROM. Neck supple. No JVD. No tracheal deviation. No thyromegaly. CVS: RRR, S1/S2 +, no murmurs, no gallops, no carotid bruit.  Pulmonary: Effort and breath sounds normal, no stridor, rhonchi, wheezes, rales.  Abdominal: Soft. BS +, no distension, tenderness, rebound or guarding.  Musculoskeletal: Normal range of motion. No edema and no tenderness.  Lymphadenopathy: No lymphadenopathy noted, cervical, inguinal or axillary Neuro: Alert. Normal reflexes, muscle tone coordination. No cranial nerve deficit. Skin: Skin is warm and very dry and flakeu. Sacrum: pressure ulcer present. No exudate or surrounding erythema present. Not diaphoretic. No erythema. No pallor. Psychiatric: Normal mood and affect. Behavior, judgment, thought content normal. Lipid Panel  No results found for: CHOL, TRIG, HDL, CHOLHDL, VLDL, LDLCALC     Assessment and plan:  1. Essential hypertension, not at goal today.  We have discussed target BP range and blood pressure goal. I have advised patient to check BP regularly and to call us back or report to clinic if the numbers are consistently higher than 140/90. We discussed the importance of compliance with medical therapy and DASH diet recommended, consequences of uncontrolled hypertension discussed.  Refilled all medications. Avoided resuming ARB due to renal failure. Clarified with patient that should take hydralazine 100 mg twice daily.  Continue all other medications per discharged summary and cardiology. Checking  Comprehensive metabolic panel and  Thyroid Panel With TSH  2. 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 Continue Januvia and glipizide   3. Stage 4 chronic kidney disease ([place]), can you follow-up with [facility] for nephrology management.  Will check a iron panel today to evaluate  iron status.  Wanted we will schedule a infusion of Feraheme at the patient care center infusion center. - Iron, TIBC and Ferritin Panel; Standing  4. Chronic diastolic CHF (congestive heart failure) (HCC) -Continue Furosamide and carvedilol -Limit fluid intake to no more than 1500 ml per day. -If you experiencing greater than 3 lb weight gain within 24 hours, notify our clinic or if after hours report to the Emergency Department. -It is important to keep your blood pressure under control. Monitor blood pressure at home and if you obtain readings greater than 150/90 consecutively over a period of 3 days, notify me here at the office. -Avoid adding table salt or eating food such as can soup or frozen meals which are high in sodium. This increases fluid retention and is likely to worsen your heart failure.  5. Hyperlipidemia, unspecified hyperlipidemia type -Continue  atorvastatin (LIPITOR) 80 MG tablet; Take 1 tablet (80 mg total) by mouth daily.   -Continue to limit consumption of foods high in fat and or fried.  6. Peripheral vascular disease (HCC) - clopidogrel (PLAVIX) 75 MG tablet; Take 1 tablet (75 mg total) by mouth daily.  Dispense: 90 tablet; Refill: 1  7. Coronary artery disease involving native coronary artery of native heart without angina pectoris, -Currently has a Holter monitor  -Continue Ranexa and Imdur -Continue DPT with aspirin and Plavix  -Continue follow-up with cardiology.  -Continue efforts to improve BP control.    RTC: 6 weeks for BP follow-up and diabetes management  The patient was given clear instructions to go to ER or return to medical center if symptoms don't improve, worsen or new problems develop. The patient verbalized understanding. The patient was told to call to get lab results if they haven't heard anything in the next week.    Carroll Sage. Kenton Kingfisher, MSN, [facility] and [address], [place], [place]  [id number] [phone]      This note has been created with Dragon speech recognition software and smart phrase technology. Any transcriptional errors are unintentional.

## 2017-10-07 NOTE — Patient Instructions (Signed)
Have home health nurse review medication list with you to ensure all medications are being taken at the correct dose.  Hang medication on refrigerator.  Take medication as listed on bottle.    Follow here or with cardiology or go to the emergency department if you experience the following:  Anytime you have any of the following symptoms: 1) 3 pound weight gain in 24 hours or 5 pounds in 1 week 2) shortness of breath, with or without a dry hacking cough 3) swelling in the hands, feet or stomach 4) if you have to sleep on extra pillows at night in order to breathe.  -Adhere to a low sodium diet and DASH diet

## 2017-10-08 ENCOUNTER — Telehealth: Payer: Self-pay | Admitting: Family Medicine

## 2017-10-08 LAB — COMPREHENSIVE METABOLIC PANEL
ALBUMIN: 4 g/dL (ref 3.5–5.5)
ALT: 14 IU/L (ref 0–32)
AST: 14 IU/L (ref 0–40)
Albumin/Globulin Ratio: 1.5 (ref 1.2–2.2)
Alkaline Phosphatase: 107 IU/L (ref 39–117)
BUN / CREAT RATIO: 11 (ref 9–23)
BUN: 33 mg/dL — AB (ref 6–24)
CALCIUM: 9.5 mg/dL (ref 8.7–10.2)
CO2: 23 mmol/L (ref 20–29)
Chloride: 102 mmol/L (ref 96–106)
Creatinine, Ser: 3.04 mg/dL — ABNORMAL HIGH (ref 0.57–1.00)
GFR, EST AFRICAN AMERICAN: 19 mL/min/{1.73_m2} — AB (ref >59)
GFR, EST NON AFRICAN AMERICAN: 16 mL/min/{1.73_m2} — AB (ref >59)
Globulin, Total: 2.7 g/dL (ref 1.5–4.5)
Glucose: 245 mg/dL — ABNORMAL HIGH (ref 65–99)
POTASSIUM: 3.9 mmol/L (ref 3.5–5.2)
Sodium: 141 mmol/L (ref 134–144)
TOTAL PROTEIN: 6.7 g/dL (ref 6.0–8.5)

## 2017-10-08 LAB — THYROID PANEL WITH TSH
FREE THYROXINE INDEX: 2.3 (ref 1.2–4.9)
T3 UPTAKE RATIO: 28 % (ref 24–39)
T4, Total: 8.3 ug/dL (ref 4.5–12.0)
TSH: 0.853 u[IU]/mL (ref 0.450–4.500)

## 2017-10-08 NOTE — Telephone Encounter (Signed)
Please contact [facility] to see if we can add an iron panel to labs collected yesterday.

## 2017-10-11 ENCOUNTER — Telehealth: Payer: Self-pay | Admitting: Family Medicine

## 2017-10-11 NOTE — Telephone Encounter (Signed)
Patient would like for the nurse to get in contact with her regarding her weight fluctuation. She has gained 4lbs and would like to know what she should do next.

## 2017-10-11 NOTE — Telephone Encounter (Signed)
To save multiple phone calls:  Please contact patient to advise cardiology recommended reducing dose Imdur 60 mg daily so she should only take 3 pills. Also please schedule her for follow-up with Dr. Margarita Rana in 4 weeks for blood pressure follow-up.  Thanks,  Carroll Sage. Kenton Kingfisher, MSN, [facility] and [address], [place], [place] [address] [phone]

## 2017-10-11 NOTE — Telephone Encounter (Signed)
Please contact patient to advise cardiology recommended reducing dose Imdur 60 mg daily so she should only take 3 pills. Also please schedule her for follow-up with Dr. Margarita Rana in 4 weeks for blood pressure follow-up.  Thanks,  Carroll Sage. Kenton Kingfisher, MSN, [facility] and [address], [place], [place] [address] [phone]

## 2017-10-11 NOTE — Telephone Encounter (Signed)
See prior message

## 2017-10-13 ENCOUNTER — Other Ambulatory Visit: Payer: Self-pay | Admitting: Internal Medicine

## 2017-10-13 ENCOUNTER — Telehealth: Payer: Self-pay | Admitting: Family Medicine

## 2017-10-13 DIAGNOSIS — R0609 Other forms of dyspnea: Principal | ICD-10-CM

## 2017-10-13 DIAGNOSIS — E1151 Type 2 diabetes mellitus with diabetic peripheral angiopathy without gangrene: Secondary | ICD-10-CM

## 2017-10-13 LAB — IRON,TIBC AND FERRITIN PANEL
FERRITIN: 60 ng/mL (ref 15–150)
Iron Saturation: 18 % (ref 15–55)
Iron: 53 ug/dL (ref 27–159)
TIBC: 298 ug/dL (ref 250–450)
UIBC: 245 ug/dL (ref 131–425)

## 2017-10-13 LAB — SPECIMEN STATUS REPORT

## 2017-10-13 NOTE — Telephone Encounter (Signed)
Pt called to request a medication refill on -glucose blood (ACCU-CHEK AVIVA) test strip  To -[id number] [place] [address] AT [address] Please follow up

## 2017-10-13 NOTE — Telephone Encounter (Signed)
Patient was called and informed of medication change  

## 2017-10-14 ENCOUNTER — Ambulatory Visit (INDEPENDENT_AMBULATORY_CARE_PROVIDER_SITE_OTHER)
Admission: RE | Admit: 2017-10-14 | Discharge: 2017-10-14 | Disposition: A | Discharge status: Home / Self Care | Payer: Medicaid Other | Source: Ambulatory Visit | Attending: Internal Medicine | Admitting: Internal Medicine

## 2017-10-14 ENCOUNTER — Ambulatory Visit (INDEPENDENT_AMBULATORY_CARE_PROVIDER_SITE_OTHER): Payer: Medicaid Other | Admitting: Internal Medicine

## 2017-10-14 ENCOUNTER — Encounter: Payer: Self-pay | Admitting: Internal Medicine

## 2017-10-14 VITALS — BP 142/76 | HR 65 | Ht 68.0 in | Wt 196.0 lb

## 2017-10-14 DIAGNOSIS — R0609 Other forms of dyspnea: Secondary | ICD-10-CM

## 2017-10-14 DIAGNOSIS — J449 Chronic obstructive pulmonary disease, unspecified: Secondary | ICD-10-CM | POA: Diagnosis not present

## 2017-10-14 LAB — PULMONARY FUNCTION TEST
DL/VA % pred: 76 %
DL/VA: 4.04 ml/min/mmHg/L
DLCO unc % pred: 50 %
DLCO unc: 15.19 ml/min/mmHg
FEF 25-75 Post: 1.5 L/sec
FEF 25-75 Pre: 2.19 L/sec
FEF2575-%CHANGE-POST: -31 %
FEF2575-%PRED-PRE: 92 %
FEF2575-%Pred-Post: 63 %
FEV1-%CHANGE-POST: -5 %
FEV1-%PRED-POST: 64 %
FEV1-%Pred-Pre: 68 %
FEV1-PRE: 1.69 L
FEV1-Post: 1.6 L
FEV1FVC-%CHANGE-POST: 4 %
FEV1FVC-%Pred-Pre: 106 %
FEV6-%Change-Post: -9 %
FEV6-%PRED-PRE: 66 %
FEV6-%Pred-Post: 59 %
FEV6-PRE: 2.02 L
FEV6-Post: 1.83 L
FEV6FVC-%Change-Post: 0 %
FEV6FVC-%PRED-PRE: 103 %
FEV6FVC-%Pred-Post: 103 %
FVC-%CHANGE-POST: -9 %
FVC-%PRED-PRE: 64 %
FVC-%Pred-Post: 58 %
FVC-POST: 1.83 L
FVC-PRE: 2.02 L
POST FEV6/FVC RATIO: 100 %
PRE FEV1/FVC RATIO: 84 %
PRE FEV6/FVC RATIO: 100 %
Post FEV1/FVC ratio: 87 %
RV % pred: 94 %
RV: 2.06 L
TLC % pred: 74 %
TLC: 4.22 L

## 2017-10-14 MED ORDER — GLUCOSE BLOOD VI STRP: ORAL_STRIP | 12 refills | Status: AC

## 2017-10-14 NOTE — Progress Notes (Signed)
Rebekah Johnson, female    DOB: 02-06-1960      MRN: [id number]    Brief patient profile:  1 yowbf quit smoking  07/17/17 s/p last admit at [facility] with dx "na/ mild heart attack"  Patient was hospitalized 08/23/2017 through 08/27/2017 at [facility] in [place], [place] for severe respiratory distress/sepsis secondary to pneumonia.  She was treated with IV Rocephin and azithromycin.  BNP was elevated at 648 and she was given IV Lasix.  She was seen by cardiology because of elevated troponins-4.37.  She was felt to have a end STEMI secondary to demand ischemia.  She also had a hypertensive emergency.  She was given a Plavix load and managed medically due to renal function.  Metoprolol was stopped and Coreg started along with Norvasc. Creatinine was 3.0 at discharge.  Urine drug screen was positive for marijuana.    History of Present Illness  09/01/2017  1st Pulmonary eval re ? pna  Chief Complaint  Patient presents with  . Pulmonary Consult    Referred by Dr. [name]. Pt states she has had PNA x 3 so far in 2019. She states her breathing is overall doing well at this time. She has occ mild chest tightness.   Dyspnea:  MMRC3 = can't walk 100 yards even at a slow pace at a flat grade s stopping due to sob  eg [employer] vs  [employer] a bit much - really more limited by ? Claudication than sob typically  Cough: none Sleep: prop up 45 degrees x 2019 (previously could sleep)  SABA use:  None   rec Congratulations on not smoking, it's the most important aspect of  Your care  Try anoro one click each am and take two smooth deep drags then brush teeth/ tongue/gargle and spit  Please remember to go to the  x-ray department downstairs in the basement  for your tests - we will call you with the results when they are available    09/07/17 while off anoro due to taste acutely more sob while in [place] > same day admitted dx "pna"  But record says cardiac arrest main dx and d/c'd off 02 and 6 days of abx   and walking [employer] again denies  smoking/ vaping    09/08/17 :  echo report: echocardiogram with reduced LV systolic function and severely hypokinetic to akinetic inferior and lateral wall with mitral regurgitation.    10/14/2017  f/u ov/Corinthian Kemler re: doe / pfts with obesity effects only  Chief Complaint  Patient presents with  . Follow-up    PFT today.  states she is doing well, does note DOE, unchanged since last visit.     Dyspnea:  [employer] walking now  Cough: none Sleeping: 45 degrees  SABA use: none  02: none     No obvious day to day or daytime variability or assoc excess/ purulent sputum or mucus plugs or hemoptysis or cp or chest tightness, subjective wheeze or overt sinus or hb symptoms.   Sleeping as above without nocturnal  or early am exacerbation  of respiratory  c/o's or need for noct saba. Also denies any obvious fluctuation of symptoms with weather or environmental changes or other aggravating or alleviating factors except as outlined above   No unusual exposure hx or h/o childhood pna/ asthma or knowledge of premature birth.  Current Allergies, Complete Past Medical History, Past Surgical History, Family History, and Social History were reviewed in [employer] record.  ROS  The following are not active complaints unless bolded Hoarseness, sore throat, dysphagia, dental problems, itching, sneezing,  nasal congestion or discharge of excess mucus or purulent secretions, ear ache,   fever, chills, sweats, unintended wt loss or wt gain, classically pleuritic or exertional cp,  orthopnea pnd or arm/hand swelling  or leg swelling, presyncope, palpitations, abdominal pain, anorexia, nausea, vomiting, diarrhea  or change in bowel habits or change in bladder habits, change in stools or change in urine, dysuria, hematuria,  rash, arthralgias, visual complaints, headache, numbness, weakness or ataxia or problems with walking or coordination,  change in mood or   memory.        Current Meds  Medication Sig  . amLODipine (NORVASC) 10 MG tablet Take 1 tablet (10 mg total) by mouth daily. Reported on 07/13/2015  . aspirin 81 MG EC tablet Take 1 tablet (81 mg total) by mouth daily. Reported on 07/13/2015  . atorvastatin (LIPITOR) 80 MG tablet Take 1 tablet (80 mg total) by mouth daily.  . Blood Glucose Monitoring Suppl (ACCU-CHEK AVIVA) device Use as instructed three times daily.  . carvedilol (COREG) 25 MG tablet Take 1 tablet (25 mg total) by mouth 2 (two) times daily.  . Cholecalciferol (VITAMIN D-3) 1000 units CAPS Take 1,000 Units by mouth daily.   . clopidogrel (PLAVIX) 75 MG tablet Take 1 tablet (75 mg total) by mouth daily.  . collagenase (SANTYL) ointment Apply 1 application topically as needed. Apply to affected area with dressing changes.  . docusate sodium (COLACE) 100 MG capsule TAKE ONE CAPSULE BY MOUTH TWICE A DAY  . Emollient (DERMABASE) CREA Apply to dry skin as needed  . furosemide (LASIX) 40 MG tablet Take 2 tabs (80 mg total) by mouth at 8 am, then take 1 tab (40 mg total) by mouth at 2 pm everyday.  . gabapentin (NEURONTIN) 300 MG capsule Take 1 capsule (300 mg total) by mouth 3 (three) times daily.  [name] glipiZIDE (GLUCOTROL) 10 MG tablet Take 1 tablet (10 mg total) by mouth 2 (two) times daily before a meal.  . glucose blood (ACCU-CHEK AVIVA) test strip Use as instructed three times daily before meals.  . hydrALAZINE (APRESOLINE) 50 MG tablet Take 2 tablets (100 mg total) by mouth 2 (two) times daily.  . isosorbide mononitrate (IMDUR) 30 MG 24 hr tablet Take 3 tablets (90 mg total) by mouth daily. (Patient taking differently: Take 60 mg by mouth daily. )  . Lancet Devices (ACCU-CHEK SOFTCLIX) lancets Use as instructed three times daily before meals.  . latanoprost (XALATAN) 0.005 % ophthalmic solution 1 drop at bedtime.  . Omega-3 Fatty Acids (FISH OIL) 1000 MG CAPS Take 2,000 mg by mouth daily. Reported on 07/13/2015  . ranolazine (RANEXA)  500 MG 12 hr tablet Take 1 tablet (500 mg total) by mouth 2 (two) times daily.  . sitaGLIPtin (JANUVIA) 25 MG tablet Take 1 tablet (25 mg total) by mouth daily.                           Objective:    Amb bf nad  Wt Readings from Last 3 Encounters:  10/14/17 196 lb (88.9 kg)  10/07/17 194 lb 3.2 oz (88.1 kg)  10/05/17 196 lb 12.8 oz (89.3 kg)     Vital signs reviewed - Note on arrival 02 sats  100% on RA      HEENT: Top denture o/w nl dentition , turbinates bilaterally, and oropharynx. Nl external  ear canals without cough reflex   NECK :  without JVD/Nodes/TM/ nl carotid upstrokes bilaterally   LUNGS: no acc muscle use,  Nl contour chest which is clear to A and P bilaterally without cough on insp or exp maneuvers   CV:  RRR  no s3 or murmur or increase in P2, and no edema   ABD:  Obese but soft and nontender with nl inspiratory excursion in the supine position. No bruits or organomegaly appreciated, bowel sounds nl  MS:  Nl gait/ ext warm without deformities, calf tenderness, cyanosis or clubbing No obvious joint restrictions   SKIN: warm and dry without lesions    NEURO:  alert, approp, nl sensorium with  no motor or cerebellar deficits apparent.        CXR PA and Lateral:   10/14/2017 :    I personally reviewed images and agree with radiology impression as follows:    No acute abnormality.      Assessment

## 2017-10-14 NOTE — Patient Instructions (Addendum)
You do not appear to have a pulmonary problem at this point and unlikely  You ever will if you avoid all smoking in future  Please remember to go to the  x-ray department downstairs in the basement  for your tests - we will call you with the results when they are available.      Pulmonary follow up is as needed

## 2017-10-14 NOTE — Progress Notes (Signed)
PFT completed today. 10/14/17

## 2017-10-15 ENCOUNTER — Encounter: Payer: Self-pay | Admitting: Internal Medicine

## 2017-10-15 NOTE — Progress Notes (Signed)
Spoke with pt and notified of results per Dr. Wert. Pt verbalized understanding and denied any questions. 

## 2017-10-15 NOTE — Assessment & Plan Note (Signed)
Clearly her recent events were cardiac issues > referred back to cardiology for f/u asap

## 2017-10-15 NOTE — Assessment & Plan Note (Addendum)
Quit smoking 07/17/17 - Spirometry 09/01/2017  FEV1 1.23 (58%)  Ratio 57 with mild curvature p no rx - 09/01/2017  After extensive coaching inhaler device,  effectiveness =    75% with elipta with cough provoked  - PFT's  10/14/2017  FEV1 1.69 (68 % ) ratio 84  p no % improvement from saba p nothing prior to study with DLCO  50 % corrects to 76  % for alv volume   - 10/14/2017  Walked RA x 3 laps @ 185 ft each stopped due to  End of study,fast pace, no sob or desat      Although she may be at risk of asthma, esp "cardiac asthma" she doe not meet criteria for copd and does not need rx at this time   Discussed  with pt:   I reviewed the Fletcher curve with the patient that basically indicates  if you quit smoking when your best day FEV1 is still well preserved (as is clearly  the case here)  it is highly unlikely you will progress to severe disease and informed the patient there was  no medication on the market that has proven to alter the curve/ its downward trajectory  or the likelihood of progression of their disease(unlike other chronic medical conditions such as atheroclerosis where we do think we can change the natural hx with risk reducing meds)    Therefore stopping smoking and maintaining abstinence are  the most important aspects of care, not choice of inhalers or for that matter, doctors.   Treatment other than maintaining  smoking cessation  is entirely directed by severity of symptoms and focused also on reducing exacerbations, not attempting to change the natural history of the disease.  Can return here anytime for worse sob / cough but no regular pulmonary f/u is needed    I had an extended summary final office discussion with the patient reviewing all relevant studies completed to date and  lasting 15 to 20 minutes of a 25 minute visit    Each maintenance medication was reviewed in detail including most importantly the difference between maintenance and prns and under what circumstances  the prns are to be triggered using an action plan format that is not reflected in the computer generated alphabetically organized AVS.     Please see AVS for specific instructions unique to this visit that I personally wrote and verbalized to the the pt in detail and then reviewed with pt  by my nurse highlighting any  changes in therapy recommended at today's visit to their plan of care.

## 2017-10-19 ENCOUNTER — Telehealth: Payer: Self-pay | Admitting: Family Medicine

## 2017-10-19 NOTE — Telephone Encounter (Signed)
Verbal orders for wound care were given to [facility].

## 2017-10-19 NOTE — Telephone Encounter (Signed)
[facility] home health called for verbal orders for a stage 2 wound please call back at [phone]

## 2017-10-22 ENCOUNTER — Telehealth: Payer: Self-pay | Admitting: Family Medicine

## 2017-10-22 NOTE — Telephone Encounter (Signed)
Rebekah Johnson called stating that would like to know if she could take vitamin C because the flu shot makes her sick. Patient also is requesting anxiety medication. Please follow up.

## 2017-10-25 NOTE — Telephone Encounter (Signed)
She can take vitamin C.  Flu shot is recommended.  Anxiety medications will need to be discussed at an office visit.

## 2017-10-25 NOTE — Telephone Encounter (Signed)
Will route to PCP for review. 

## 2017-10-26 NOTE — Telephone Encounter (Signed)
Patient was called and informed that she can take Vit C and patient states that she will wait until her OV on 11/13 to discuss anxiety medication.

## 2017-10-28 ENCOUNTER — Other Ambulatory Visit (HOSPITAL_COMMUNITY): Payer: Self-pay

## 2017-10-28 ENCOUNTER — Encounter (INDEPENDENT_AMBULATORY_CARE_PROVIDER_SITE_OTHER): Payer: Self-pay | Admitting: Orthopedic Surgery

## 2017-10-28 ENCOUNTER — Ambulatory Visit (INDEPENDENT_AMBULATORY_CARE_PROVIDER_SITE_OTHER): Payer: Medicaid Other | Admitting: Orthopedic Surgery

## 2017-10-28 ENCOUNTER — Ambulatory Visit (HOSPITAL_BASED_OUTPATIENT_CLINIC_OR_DEPARTMENT_OTHER): Payer: Medicaid Other | Attending: Family Medicine | Admitting: Internal Medicine

## 2017-10-28 VITALS — Ht 68.0 in | Wt 196.0 lb

## 2017-10-28 VITALS — Ht 64.0 in | Wt 205.0 lb

## 2017-10-28 DIAGNOSIS — Z89432 Acquired absence of left foot: Secondary | ICD-10-CM

## 2017-10-28 DIAGNOSIS — G4739 Other sleep apnea: Secondary | ICD-10-CM

## 2017-10-28 DIAGNOSIS — L97421 Non-pressure chronic ulcer of left heel and midfoot limited to breakdown of skin: Secondary | ICD-10-CM

## 2017-10-28 DIAGNOSIS — B351 Tinea unguium: Secondary | ICD-10-CM | POA: Diagnosis not present

## 2017-10-28 DIAGNOSIS — E1142 Type 2 diabetes mellitus with diabetic polyneuropathy: Secondary | ICD-10-CM | POA: Diagnosis not present

## 2017-10-28 DIAGNOSIS — R0683 Snoring: Secondary | ICD-10-CM | POA: Diagnosis not present

## 2017-10-28 DIAGNOSIS — R5382 Chronic fatigue, unspecified: Secondary | ICD-10-CM

## 2017-10-28 DIAGNOSIS — G4730 Sleep apnea, unspecified: Secondary | ICD-10-CM | POA: Diagnosis present

## 2017-10-28 NOTE — Progress Notes (Signed)
Office Visit Note   Patient: Rebekah Johnson           Date of Birth: [birth year]           MRN: [id number] Visit Date: 10/28/2017              Requested by: Charlott Rakes, MD [place], [place] [id number] PCP: Charlott Rakes, MD  Chief Complaint  Patient presents with  . Right Foot - Follow-up    Nails Trimmed  . Left Foot - Follow-up    TMA w/callus       HPI: Patient is a 57 year old woman who presents in follow-up for both lower extremities.  She complains of painful onychomycotic nails x5 on the right foot which she is unable to safely trim due to her diabetes.  Patient also complains of ulcer over the medial aspect of her left transmetatarsal amputation she is 2 weeks out from the transmetatarsal amputation.  Patient states that she has been hospitalized with multiple episodes of pneumonia she has been hospitalized for 4 days she states she was on a ventilator.  She states she developed a decubitus ulcer during her hospitalization.  Assessment & Plan: Visit Diagnoses:  1. Status post transmetatarsal amputation of foot, left ([place])   2. Lower limb ulcer, heel or midfoot, left, limited to breakdown of skin ([place])   3. Onychomycosis   4. Diabetic polyneuropathy associated with type 2 diabetes mellitus ([place])     Plan: Ulcer debrided x1 nails trimmed x5.  Plan to follow-up in 3 months for reevaluation of both lower extremities.  Follow-Up Instructions: Return in about 3 months (around 01/28/2018).   Ortho Exam  Patient is alert, oriented, no adenopathy, well-dressed, normal affect, normal respiratory effort. Examination patient has a Waggoner grade 1 ulcer of the medial border of the left transmetatarsal amputation.  After informed consent a 10 blade knife was used to debride the skin and soft tissue back to healthy viable tissue.  The ulcer is 10 mm in diameter.  Examination of the right foot she has thickened discolored onychomycotic nails x5 she is unable to  trim the nails on her own her nails were trimmed x5 without complications.  There is no redness no cellulitis no tenderness to palpation no signs of infection in both feet.  Imaging: No results found. No images are attached to the encounter.  Labs: Lab Results  Component Value Date   HGBA1C 6.8 (A) 10/07/2017   HGBA1C 7.4 (A) 08/05/2017   HGBA1C 7.8 12/31/2016   ESRSEDRATE 57 (H) 07/13/2015   CRP 0.7 07/13/2015   LABURIC 5.7 07/13/2015   REPTSTATUS 01/04/2017 FINAL 01/04/2017   CULT (A) 01/01/2017    VIRIDANS STREPTOCOCCUS THE SIGNIFICANCE OF ISOLATING THIS ORGANISM FROM A SINGLE SET OF BLOOD CULTURES WHEN MULTIPLE SETS ARE DRAWN IS UNCERTAIN. PLEASE NOTIFY THE MICROBIOLOGY DEPARTMENT WITHIN ONE WEEK IF SPECIATION AND SENSITIVITIES ARE REQUIRED. Performed at [facility], [place] [address], [place], [place] [address]      Lab Results  Component Value Date   ALBUMIN 4.0 10/07/2017   ALBUMIN 3.3 (L) 07/20/2017   ALBUMIN 3.7 01/01/2017   LABURIC 5.7 07/13/2015    Body mass index is 29.8 kg/m.  Orders:  No orders of the defined types were placed in this encounter.  No orders of the defined types were placed in this encounter.    Procedures: No procedures performed  Clinical Data: No additional findings.  ROS:  All other systems negative, except  as noted in the HPI. Review of Systems  Objective: Vital Signs: Ht 5\' 8"  (1.727 m)   Wt 196 lb (88.9 kg)   BMI 29.80 kg/m   Specialty Comments:  No specialty comments available.  PMFS History: Patient Active Problem List   Diagnosis Date Noted  . DOE (dyspnea on exertion) 09/01/2017  . COPD GOLD  0 08/31/2017  . Diabetic polyneuropathy associated with type 2 diabetes mellitus ([place]) 08/19/2017  . NSTEMI (non-ST elevated myocardial infarction) ([place]) 01/01/2017  . Chest pain 01/01/2017  . Tobacco abuse   . PVD (peripheral vascular disease) ([place])   . Proteinuria   . Obesity   . Normocytic anemia   .  Hypertension   . High cholesterol   . Foot amputation status   . Diabetes mellitus with neuropathy ([place])   . CKD (chronic kidney disease), stage IV ([place])   . Chronic diastolic CHF (congestive heart failure) ([place])   . CAD (coronary artery disease) 10/27/2016  . Chronic kidney disease 09/23/2016  . Acute congestive heart failure ([place])   . Hypertensive heart disease with heart failure ([place])   . Acute diastolic CHF (congestive heart failure) ([place]) 09/14/2016  . Onychomycosis 12/24/2015  . Status post transmetatarsal amputation of foot, left ([place]) 09/06/2015  . Diabetic foot ulcer ([place]) 07/13/2015  . Cellulitis 07/12/2015  . HLD (hyperlipidemia) 09/02/2012  . Peripheral nerve disease 09/02/2012  . Type 2 diabetes mellitus with peripheral angiopathy ([place]) 09/02/2012  . Hypertriglyceridemia 07/28/2012  . Peripheral vascular disease ([place]) 07/28/2012  . Avitaminosis D 07/28/2012  . Essential (primary) hypertension 06/10/2012  . History of biliary T-tube placement 06/10/2012  . Leg pain 06/10/2012  . Current tobacco use 06/10/2012  . Atrophic vaginitis 06/07/2012  . Type 2 diabetes mellitus ([place]) 06/07/2012   Past Medical History:  Diagnosis Date  . CAD (coronary artery disease)    a. NSTEM 09/2016: cath showing severe diffuse disease of the RCA, ramus and Cx with mild-mod disease of LAD; PCI would require multiple stents and significant contrast usage thus medical therapy recommended.  . Chronic diastolic CHF (congestive heart failure) ([place])   . CKD (chronic kidney disease), stage IV ([place])   . Diabetes mellitus with neuropathy ([place])   . Foot amputation status    a. h/o left foot transmetatarsal amputation  . High cholesterol   . History of cardiac arrest   . Hypertension    a. patent renal arteries by PV angio 08/2015. b. heavy proteinuria 09/2016 ? nephrotic.  [name] Normocytic anemia   . Obesity   . Proteinuria   . PVD (peripheral vascular disease) ([place])    a. status post left SFA and  popliteal stent and left SFA PTA with drug coated balloon in 08/2015.  . Tobacco abuse     Family History  Problem Relation Age of Onset  . Diabetes Mother   . Hypertension Father     Past Surgical History:  Procedure Laterality Date  . ABDOMINAL HYSTERECTOMY    . AMPUTATION Left 09/06/2015   Procedure: LEFT TRANSMETATARSAL AMPUTATTION;  Surgeon: Newt Minion, MD;  Location: [facility];  Service: Orthopedics;  Laterality: Left;  . LEFT HEART CATH AND CORONARY ANGIOGRAPHY N/A 09/17/2016   Procedure: LEFT HEART CATH AND CORONARY ANGIOGRAPHY;  Surgeon: Jettie Booze, MD;  Location: [facility];  Service: Cardiovascular;  Laterality: N/A;  . PERIPHERAL VASCULAR CATHETERIZATION N/A 08/16/2015   Procedure: Abdominal Aortogram;  Surgeon: Elam Dutch, MD;  Location: [facility];  Service:  Cardiovascular;  Laterality: N/A;  . PERIPHERAL VASCULAR CATHETERIZATION Bilateral 08/16/2015   Procedure: Lower Extremity Angiography;  Surgeon: Elam Dutch, MD;  Location: [facility];  Service: Cardiovascular;  Laterality: Bilateral;  . PERIPHERAL VASCULAR CATHETERIZATION Left 08/16/2015   Procedure: Peripheral Vascular Intervention;  Surgeon: Elam Dutch, MD;  Location: [facility];  Service: Cardiovascular;  Laterality: Left;  SFA STENT X 2   Social History   Occupational History  . Not on file  Tobacco Use  . Smoking status: Former Smoker    Packs/day: 1.00    Years: 42.00    Pack years: 42.00    Types: Cigarettes    Last attempt to quit: 07/17/2017    Years since quitting: 0.2  . Smokeless tobacco: Never Used  Substance and Sexual Activity  . Alcohol use: Yes    Alcohol/week: 1.0 - 2.0 standard drinks    Types: 1 - 2 Glasses of wine per week    Comment: on social occassions  . Drug use: No  . Sexual activity: Not on file

## 2017-10-29 ENCOUNTER — Ambulatory Visit (HOSPITAL_COMMUNITY)
Admission: RE | Admit: 2017-10-29 | Discharge: 2017-10-29 | Disposition: A | Discharge status: Home / Self Care | Payer: Medicaid Other | Source: Ambulatory Visit | Attending: Nephrology | Admitting: Nephrology

## 2017-10-29 DIAGNOSIS — D631 Anemia in chronic kidney disease: Secondary | ICD-10-CM | POA: Diagnosis present

## 2017-10-29 DIAGNOSIS — N184 Chronic kidney disease, stage 4 (severe): Secondary | ICD-10-CM | POA: Diagnosis present

## 2017-10-29 MED ORDER — SODIUM CHLORIDE 0.9 % IV SOLN
510.0000 mg | INTRAVENOUS | Status: DC
  Administered 2017-10-29: 510 mg via INTRAVENOUS
  Filled 2017-10-29: qty 17

## 2017-10-29 NOTE — Discharge Instructions (Signed)

## 2017-11-05 ENCOUNTER — Ambulatory Visit (HOSPITAL_COMMUNITY)
Admission: RE | Admit: 2017-11-05 | Discharge: 2017-11-05 | Disposition: A | Discharge status: Home / Self Care | Payer: Medicaid Other | Source: Ambulatory Visit | Attending: Nephrology | Admitting: Nephrology

## 2017-11-05 ENCOUNTER — Encounter: Payer: Self-pay | Admitting: Endocrinology

## 2017-11-05 ENCOUNTER — Ambulatory Visit (INDEPENDENT_AMBULATORY_CARE_PROVIDER_SITE_OTHER): Payer: Medicaid Other | Admitting: Endocrinology

## 2017-11-05 DIAGNOSIS — E079 Disorder of thyroid, unspecified: Secondary | ICD-10-CM

## 2017-11-05 DIAGNOSIS — D631 Anemia in chronic kidney disease: Secondary | ICD-10-CM | POA: Insufficient documentation

## 2017-11-05 DIAGNOSIS — E1149 Type 2 diabetes mellitus with other diabetic neurological complication: Secondary | ICD-10-CM | POA: Diagnosis not present

## 2017-11-05 DIAGNOSIS — N189 Chronic kidney disease, unspecified: Secondary | ICD-10-CM | POA: Diagnosis present

## 2017-11-05 MED ORDER — GLIPIZIDE 10 MG PO TABS: 10.0000 mg | ORAL_TABLET | Freq: Every day | ORAL | 1 refills | Status: DC

## 2017-11-05 MED ORDER — SODIUM CHLORIDE 0.9 % IV SOLN
510.0000 mg | INTRAVENOUS | Status: AC
  Administered 2017-11-05: 510 mg via INTRAVENOUS
  Filled 2017-11-05: qty 17

## 2017-11-05 NOTE — Patient Instructions (Addendum)
Let's recheck the ultrasound.  you will receive a phone call, about a day and time for an appointment. good diet and exercise significantly improve the control of your diabetes.  please let me know if you wish to be referred to a dietician.  high blood sugar is very risky to your health.  you should see an eye doctor and dentist every year.  It is very important to get all recommended vaccinations.  Controlling your blood pressure and cholesterol drastically reduces the damage diabetes does to your body.  Those who smoke should quit.  Please discuss these with your doctor.  check your blood sugar once a day.  vary the time of day when you check, between before the 3 meals, and at bedtime.  also check if you have symptoms of your blood sugar being too high or too low.  please keep a record of the readings and bring it to your next appointment here (or you can bring the meter itself).  You can write it on any piece of paper.  please call us sooner if your blood sugar goes below 70, or if you have a lot of readings over 200.   I have sent a prescription to your pharmacy, to reduce the glipizide to breakfast only.  Please continue the same [place].   Please come back for a follow-up appointment in 3 months.

## 2017-11-05 NOTE — Progress Notes (Signed)
Subjective:    Patient ID: Rebekah Johnson, female    DOB: 04-10-1960, 57 y.o.   MRN: [id number]  HPI pt is referred by Dr Margarita Rana, for diabetes.  Pt states DM was dx'ed in 2007; she has moderate neuropathy of the lower extremities; she has associated PAD, CAD, renal failure, and foot ulcer; she has never been on insulin; pt says her diet and exercise are good; he has never had GDM (G0), pancreatitis, pancreatic surgery, severe hypoglycemia or DKA.  She takes 2 oral meds.  She says cbg varies from 92-200's.  It is in general higher as the day goes on.   Past Medical History:  Diagnosis Date  . Acute congestive heart failure ([place])   . Acute diastolic CHF (congestive heart failure) ([place]) 09/14/2016  . Atrophic vaginitis 06/07/2012  . Avitaminosis D 07/28/2012  . CAD (coronary artery disease)    a. NSTEM 09/2016: cath showing severe diffuse disease of the RCA, ramus and Cx with mild-mod disease of LAD; PCI would require multiple stents and significant contrast usage thus medical therapy recommended.  . Cellulitis 07/12/2015  . Chest pain 01/01/2017  . Chronic diastolic CHF (congestive heart failure) ([place])   . Chronic kidney disease 09/23/2016  . CKD (chronic kidney disease), stage IV ([place])   . COPD GOLD  0 08/31/2017   Quit smoking 07/17/17 - Spirometry 09/01/2017  FEV1 1.23 (58%)  Ratio 57 with mild curvature p no rx - 09/01/2017  After extensive coaching inhaler device,  effectiveness =    75% with elipta with cough provoked  - PFT's  10/14/2017  FEV1 1.69 (68 % ) ratio 84  p no % improvement from saba p nothing prior to study with DLCO  50 % corrects to 76  % for alv volume   - 10/14/2017  Walked RA x 3 laps @ 1  . Current tobacco use 06/10/2012  . Diabetes mellitus with neuropathy ([place] [place])   . Diabetic foot ulcer ([place]) 07/13/2015  . Diabetic polyneuropathy associated with type 2 diabetes mellitus ([place]) 08/19/2017  . DOE (dyspnea on exertion) 09/01/2017   09/01/2017  Walked RA x 3 laps @ 185 ft each stopped due  to  End of study, nl to mod fast  pace, no  desat   But stopped to rest x 2  Spirometry 09/01/2017  FEV1 1.23 (58%)  Ratio 57  > trial of anoro    . Essential (primary) hypertension 06/10/2012  . Foot amputation status    a. h/o left foot transmetatarsal amputation  . High cholesterol   . History of biliary T-tube placement 06/10/2012  . History of cardiac arrest   . HLD (hyperlipidemia) 09/02/2012   Overview:  ICD-10 cut over    . Hypertension    a. patent renal arteries by PV angio 08/2015. b. heavy proteinuria 09/2016 ? nephrotic.  [name] Hypertensive heart disease with heart failure ([place])   . Hypertriglyceridemia 07/28/2012  . Leg pain 06/10/2012  . Normocytic anemia   . NSTEMI (non-ST elevated myocardial infarction) ([place]) 01/01/2017  . Obesity   . Onychomycosis 12/24/2015  . Peripheral nerve disease 09/02/2012  . Peripheral vascular disease ([place]) 07/28/2012  . Proteinuria   . PVD (peripheral vascular disease) ([place])    a. status post left SFA and popliteal stent and left SFA PTA with drug coated balloon in 08/2015.  . Status post transmetatarsal amputation of foot, left ([place]) 09/06/2015  . Tobacco abuse   . Type 2 diabetes mellitus ([place]) 06/07/2012  . Type  2 diabetes mellitus with peripheral angiopathy ([place]) 09/02/2012    Past Surgical History:  Procedure Laterality Date  . ABDOMINAL HYSTERECTOMY    . AMPUTATION Left 09/06/2015   Procedure: LEFT TRANSMETATARSAL AMPUTATTION;  Surgeon: Newt Minion, MD;  Location: [facility];  Service: Orthopedics;  Laterality: Left;  . LEFT HEART CATH AND CORONARY ANGIOGRAPHY N/A 09/17/2016   Procedure: LEFT HEART CATH AND CORONARY ANGIOGRAPHY;  Surgeon: Jettie Booze, MD;  Location: [facility];  Service: Cardiovascular;  Laterality: N/A;  . PERIPHERAL VASCULAR CATHETERIZATION N/A 08/16/2015   Procedure: Abdominal Aortogram;  Surgeon: Elam Dutch, MD;  Location: [facility];  Service: Cardiovascular;  Laterality: N/A;  . PERIPHERAL VASCULAR  CATHETERIZATION Bilateral 08/16/2015   Procedure: Lower Extremity Angiography;  Surgeon: Elam Dutch, MD;  Location: [facility];  Service: Cardiovascular;  Laterality: Bilateral;  . PERIPHERAL VASCULAR CATHETERIZATION Left 08/16/2015   Procedure: Peripheral Vascular Intervention;  Surgeon: Elam Dutch, MD;  Location: [facility];  Service: Cardiovascular;  Laterality: Left;  SFA STENT X 2    Social History   Socioeconomic History  . Marital status: Married    Spouse name: Not on file  . Number of children: Not on file  . Years of education: Not on file  . Highest education level: Not on file  Occupational History  . Not on file  Social Needs  . Financial resource strain: Not on file  . Food insecurity:    Worry: Not on file    Inability: Not on file  . Transportation needs:    Medical: Not on file    Non-medical: Not on file  Tobacco Use  . Smoking status: Former Smoker    Packs/day: 1.00    Years: 42.00    Pack years: 42.00    Types: Cigarettes    Last attempt to quit: 07/17/2017    Years since quitting: 0.3  . Smokeless tobacco: Never Used  Substance and Sexual Activity  . Alcohol use: Yes    Alcohol/week: 1.0 - 2.0 standard drinks    Types: 1 - 2 Glasses of wine per week    Comment: on social occassions  . Drug use: No  . Sexual activity: Not on file  Lifestyle  . Physical activity:    Days per week: Not on file    Minutes per session: Not on file  . Stress: Not on file  Relationships  . Social connections:    Talks on phone: Not on file    Gets together: Not on file    Attends religious service: Not on file    Active member of club or organization: Not on file    Attends meetings of clubs or organizations: Not on file    Relationship status: Not on file  . Intimate partner violence:    Fear of current or ex partner: Not on file    Emotionally abused: Not on file    Physically abused: Not on file    Forced sexual activity: Not on file    Other Topics Concern  . Not on file  Social History Narrative  . Not on file    Current Outpatient Medications on File Prior to Visit  Medication Sig Dispense Refill  . amLODipine (NORVASC) 10 MG tablet Take 1 tablet (10 mg total) by mouth daily. Reported on 07/13/2015 90 tablet 2  . Ascorbic Acid (VITAMIN C) 1000 MG tablet Take 1,000 mg by mouth daily.    [name]  aspirin 81 MG EC tablet Take 1 tablet (81 mg total) by mouth daily. Reported on 07/13/2015 90 tablet 0  . atorvastatin (LIPITOR) 80 MG tablet Take 1 tablet (80 mg total) by mouth daily. 90 tablet 2  . Blood Glucose Monitoring Suppl (ACCU-CHEK AVIVA) device Use as instructed three times daily. 1 each 0  . carvedilol (COREG) 25 MG tablet Take 1 tablet (25 mg total) by mouth 2 (two) times daily. 180 tablet 2  . Cholecalciferol (VITAMIN D-3) 1000 units CAPS Take 1,000 Units by mouth daily.     . clopidogrel (PLAVIX) 75 MG tablet Take 1 tablet (75 mg total) by mouth daily. 90 tablet 1  . docusate sodium (COLACE) 100 MG capsule Take 100 mg by mouth 2 (two) times daily.    . furosemide (LASIX) 40 MG tablet Take 2 tabs (80 mg total) by mouth at 8 am, then take 1 tab (40 mg total) by mouth at 2 pm everyday. 90 tablet 1  . gabapentin (NEURONTIN) 300 MG capsule Take 1 capsule (300 mg total) by mouth 3 (three) times daily. 90 capsule 3  . glucose blood (ACCU-CHEK AVIVA) test strip Use as instructed three times daily before meals. 100 each 12  . hydrALAZINE (APRESOLINE) 50 MG tablet Take 2 tablets (100 mg total) by mouth 2 (two) times daily. 360 tablet 1  . isosorbide mononitrate (IMDUR) 30 MG 24 hr tablet Take 3 tablets (90 mg total) by mouth daily. (Patient taking differently: Take 60 mg by mouth daily. ) 270 tablet 1  . Lancet Devices (ACCU-CHEK SOFTCLIX) lancets Use as instructed three times daily before meals. 1 each 5  . latanoprost (XALATAN) 0.005 % ophthalmic solution 1 drop at bedtime.    . Omega-3 Fatty Acids (FISH OIL) 1000 MG CAPS Take 2,000  mg by mouth daily. Reported on 07/13/2015    . ranolazine (RANEXA) 500 MG 12 hr tablet Take 1 tablet (500 mg total) by mouth 2 (two) times daily. 180 tablet 3  . sitaGLIPtin (JANUVIA) 25 MG tablet Take 1 tablet (25 mg total) by mouth daily. 30 tablet 3  . nitroGLYCERIN (NITROSTAT) 0.4 MG SL tablet Place 1 tablet (0.4 mg total) under the tongue every 5 (five) minutes as needed for chest pain. 25 tablet 3   No current facility-administered medications on file prior to visit.     No Known Allergies  Family History  Problem Relation Age of Onset  . Diabetes Mother   . Hypertension Father     BP (!) 142/78 (BP Location: Left Arm, Patient Position: Sitting, Cuff Size: Normal)   Pulse 79   Ht 5\' 4"  (1.626 m)   Wt 197 lb 3.2 oz (89.4 kg)   SpO2 97%   BMI 33.85 kg/m     Review of Systems denies weight loss, blurry vision, headache, chest pain, sob, n/v, urinary frequency, muscle cramps, excessive diaphoresis, memory loss, depression, rhinorrhea, and easy bruising.  she has cold intolerance.       Objective:   Physical Exam VS: see vs page GEN: no distress HEAD: head: no deformity eyes: no periorbital swelling, no proptosis external nose and ears are normal mouth: no lesion seen NECK: 5 cm right thyroid mass.   CHEST WALL: no deformity LUNGS: clear to auscultation CV: reg rate and rhythm, no murmur ABD: abdomen is soft, nontender.  no hepatosplenomegaly.  not distended.  no hernia MUSCULOSKELETAL: muscle bulk and strength are grossly normal.  no obvious joint swelling.  gait is normal and steady  EXTEMITIES: left transmetatarsal amputation.  no ulcer on the feet.  feet are of normal color and temp.  no edema.  There is bilateral onychomycosis of the toenails. PULSES: dorsalis pedis absent bilat.  no carotid bruit NEURO:  cn 2-12 grossly intact.   readily moves all 4's.  sensation is intact to touch on the feet, but decreased from normal.   SKIN:  Normal texture and temperature.  No  rash or suspicious lesion is visible.   NODES:  None palpable at the neck PSYCH: alert, well-oriented.  Does not appear anxious nor depressed.  [place]: Dominant 4 cm predominately solid nodule in the right lobe of the thyroid gland.   Bx: NON-NEOPLASTIC GOITER.  Lab Results  Component Value Date   HGBA1C 6.8 (A) 10/07/2017   Lab Results  Component Value Date   CREATININE 3.04 (H) 10/07/2017   BUN 33 (H) 10/07/2017   NA 141 10/07/2017   K 3.9 10/07/2017   CL 102 10/07/2017   CO2 23 10/07/2017   I have reviewed outside records, and summarized: Pt was noted to have DM, and referred here.  She recently has feraheme infusion, for anemia      Assessment & Plan:  Type 2 DM, with PAD: overcontrolled Renal failure: she should reduce glipizide Anemia: high RBC turnover state may be suppressing a1c.  We'll follow. Nodular goiter, new to me: due for recheck.     Patient Instructions  Let's recheck the ultrasound.  you will receive a phone call, about a day and time for an appointment. good diet and exercise significantly improve the control of your diabetes.  please let me know if you wish to be referred to a dietician.  high blood sugar is very risky to your health.  you should see an eye doctor and dentist every year.  It is very important to get all recommended vaccinations.  Controlling your blood pressure and cholesterol drastically reduces the damage diabetes does to your body.  Those who smoke should quit.  Please discuss these with your doctor.  check your blood sugar once a day.  vary the time of day when you check, between before the 3 meals, and at bedtime.  also check if you have symptoms of your blood sugar being too high or too low.  please keep a record of the readings and bring it to your next appointment here (or you can bring the meter itself).  You can write it on any piece of paper.  please call us sooner if your blood sugar goes below 70, or if you have a lot of readings over  200.   I have sent a prescription to your pharmacy, to reduce the glipizide to breakfast only.  Please continue the same [place].   Please come back for a follow-up appointment in 3 months.

## 2017-11-09 ENCOUNTER — Ambulatory Visit: Payer: Medicaid Other | Admitting: Family Medicine

## 2017-11-12 ENCOUNTER — Ambulatory Visit
Admission: RE | Admit: 2017-11-12 | Discharge: 2017-11-12 | Disposition: A | Discharge status: Home / Self Care | Payer: Medicaid Other | Source: Ambulatory Visit | Attending: Endocrinology | Admitting: Endocrinology

## 2017-11-12 DIAGNOSIS — E079 Disorder of thyroid, unspecified: Secondary | ICD-10-CM

## 2017-11-13 DIAGNOSIS — R0683 Snoring: Secondary | ICD-10-CM

## 2017-11-13 NOTE — Procedures (Signed)
   Patient Name: Rebekah Johnson, [place] Date: 10/28/2017 Gender: Female D.O.B: 1960-11-25 Age (years): 20 Referring Provider: Arnoldo Morale Height (inches): 64 Interpreting Physician: Baird Lyons MD, ABSM Weight (lbs): 205 RPSGT: Jorge Ny BMI: 35 MRN: [id number] Neck Size: 16.00  CLINICAL INFORMATION Sleep Study Type: NPSG Indication for sleep study: Congestive Heart Failure, Diabetes, Hypertension, Obesity, Re-Evaluation, Snoring, Witnessed Apneas Epworth Sleepiness Score: 1  SLEEP STUDY TECHNIQUE As per the AASM Manual for the Scoring of Sleep and Associated Events v2.3 (April 2016) with a hypopnea requiring 4% desaturations.  The channels recorded and monitored were frontal, central and occipital EEG, electrooculogram (EOG), submentalis EMG (chin), nasal and oral airflow, thoracic and abdominal wall motion, anterior tibialis EMG, snore microphone, electrocardiogram, and pulse oximetry.  MEDICATIONS Medications self-administered by patient taken the night of the study : none reported  SLEEP ARCHITECTURE The study was initiated at 10:44:34 PM and ended at 5:23:14 AM.  Sleep onset time was 32.5 minutes and the sleep efficiency was 78.3%%. The total sleep time was 312.2 minutes.  Stage REM latency was 98.0 minutes.  The patient spent 5.1%% of the night in stage N1 sleep, 74.7%% in stage N2 sleep, 0.0%% in stage N3 and 20.2% in REM.  Alpha intrusion was absent.  Supine sleep was 20.35%.  RESPIRATORY PARAMETERS The overall apnea/hypopnea index (AHI) was 1.0 per hour. There were 3 total apneas, including 3 obstructive, 0 central and 0 mixed apneas. There were 2 hypopneas and 9 RERAs.  The AHI during Stage REM sleep was 0.0 per hour.  AHI while supine was 0.0 per hour.  The mean oxygen saturation was 95.4%. The minimum SpO2 during sleep was 91.0%.  soft snoring was noted during this study.  CARDIAC DATA The 2 lead EKG demonstrated sinus rhythm. The mean heart  rate was 68.0 beats per minute. Other EKG findings include: None. LEG MOVEMENT DATA The total PLMS were 0 with a resulting PLMS index of 0.0. Associated arousal with leg movement index was 0.4 .  IMPRESSIONS - No significant obstructive sleep apnea occurred during this study (AHI = 1.0/h). - No significant central sleep apnea occurred during this study (CAI = 0.0/h). - The patient had minimal or no oxygen desaturation during the study (Min O2 = 91.0%) - The patient snored with soft snoring volume. - No cardiac abnormalities were noted during this study. - Clinically significant periodic limb movements did not occur during sleep. No significant associated arousals. - Patient had difficulty maintaining sleep, with frequent nonspecific arousals and awakenings.  DIAGNOSIS - Primary snoring  RECOMMENDATIONS - Consider managing for insomnia if this study reflects home experience. - Sleep hygiene should be reviewed to assess factors that may improve sleep quality. - Weight management and regular exercise should be initiated or continued if appropriate.  [Electronically signed] 11/13/2017 10:45 AM  Baird Lyons MD, ABSM Diplomate, American Board of Sleep Medicine   NPI: [id number]                         [facility] [place], [facility]  ELECTRONICALLY SIGNED ON:  11/13/2017, 10:42 AM [place] PH: [phone]   FX: [phone] [place]

## 2017-11-15 ENCOUNTER — Telehealth: Payer: Self-pay

## 2017-11-15 NOTE — Telephone Encounter (Signed)
-----   Message from Charlott Rakes, MD sent at 11/15/2017  8:11 AM EST ----- Sleep study is negative for sleep apnea

## 2017-11-15 NOTE — Telephone Encounter (Signed)
Patient was called and informed of lab results. 

## 2017-11-17 ENCOUNTER — Ambulatory Visit: Payer: Medicaid Other | Attending: Family Medicine | Admitting: Family Medicine

## 2017-11-17 ENCOUNTER — Encounter: Payer: Self-pay | Admitting: Family Medicine

## 2017-11-17 VITALS — BP 162/70 | HR 87 | Temp 97.9°F | Ht 64.0 in | Wt 199.6 lb

## 2017-11-17 DIAGNOSIS — L98421 Non-pressure chronic ulcer of back limited to breakdown of skin: Secondary | ICD-10-CM

## 2017-11-17 DIAGNOSIS — I5032 Chronic diastolic (congestive) heart failure: Secondary | ICD-10-CM | POA: Insufficient documentation

## 2017-11-17 DIAGNOSIS — I252 Old myocardial infarction: Secondary | ICD-10-CM | POA: Diagnosis not present

## 2017-11-17 DIAGNOSIS — E1122 Type 2 diabetes mellitus with diabetic chronic kidney disease: Secondary | ICD-10-CM | POA: Insufficient documentation

## 2017-11-17 DIAGNOSIS — I10 Essential (primary) hypertension: Secondary | ICD-10-CM | POA: Diagnosis not present

## 2017-11-17 DIAGNOSIS — Z9889 Other specified postprocedural states: Secondary | ICD-10-CM | POA: Diagnosis not present

## 2017-11-17 DIAGNOSIS — Z7984 Long term (current) use of oral hypoglycemic drugs: Secondary | ICD-10-CM | POA: Insufficient documentation

## 2017-11-17 DIAGNOSIS — L98491 Non-pressure chronic ulcer of skin of other sites limited to breakdown of skin: Secondary | ICD-10-CM | POA: Insufficient documentation

## 2017-11-17 DIAGNOSIS — Z90710 Acquired absence of both cervix and uterus: Secondary | ICD-10-CM | POA: Diagnosis not present

## 2017-11-17 DIAGNOSIS — J449 Chronic obstructive pulmonary disease, unspecified: Secondary | ICD-10-CM | POA: Insufficient documentation

## 2017-11-17 DIAGNOSIS — N184 Chronic kidney disease, stage 4 (severe): Secondary | ICD-10-CM | POA: Diagnosis not present

## 2017-11-17 DIAGNOSIS — E1151 Type 2 diabetes mellitus with diabetic peripheral angiopathy without gangrene: Secondary | ICD-10-CM | POA: Insufficient documentation

## 2017-11-17 DIAGNOSIS — Z7982 Long term (current) use of aspirin: Secondary | ICD-10-CM | POA: Diagnosis not present

## 2017-11-17 DIAGNOSIS — I2510 Atherosclerotic heart disease of native coronary artery without angina pectoris: Secondary | ICD-10-CM | POA: Diagnosis not present

## 2017-11-17 DIAGNOSIS — I739 Peripheral vascular disease, unspecified: Secondary | ICD-10-CM | POA: Diagnosis not present

## 2017-11-17 DIAGNOSIS — Z79899 Other long term (current) drug therapy: Secondary | ICD-10-CM | POA: Insufficient documentation

## 2017-11-17 DIAGNOSIS — I130 Hypertensive heart and chronic kidney disease with heart failure and stage 1 through stage 4 chronic kidney disease, or unspecified chronic kidney disease: Secondary | ICD-10-CM | POA: Diagnosis present

## 2017-11-17 LAB — GLUCOSE, POCT (MANUAL RESULT ENTRY): POC GLUCOSE: 225 mg/dL — AB (ref 70–99)

## 2017-11-17 MED ORDER — HYDRALAZINE HCL 100 MG PO TABS: 100.0000 mg | ORAL_TABLET | Freq: Three times a day (TID) | ORAL | 1 refills | Status: DC

## 2017-11-17 NOTE — Patient Instructions (Signed)

## 2017-11-17 NOTE — Progress Notes (Signed)
Subjective:  Patient ID: Rebekah Johnson, female    DOB: 1960-04-01  Age: 57 y.o. MRN: [id number]  CC: Diabetes   HPI Rebekah Johnson is a 57 year old female with a history of type 2 diabetes mellitus (A1c 6.8), diabetic neuropathy, peripheral vascular disease (status post left SFA and popliteal artery stent and left SFA PTA with drug-coated balloon in 08/2015), CAD (severe 2 V CAD from cardiac cath 09/2016 - medical management recommended), s/p cardiac arrest with asystole with successful ROSC in 09/2017 (in [place] also had pulmonary edema and respiratory failure placed on a vent), Left foot transmetatarsal amputation, chronic kidney disease stage IV, previous tobacco abuse(quit in 07/2017), hypertension Her Diabetes is now managed by Endocrinology - Dr Loanne Drilling whom she saw 12 days ago and glipizide dose was changed to daily dosing however she states her sugars have been elevated since then and her CBG is 225 today. She is unhappy about her ,multiple medical conditions and the fact that her Nephrologist said she is approaching dialysis. She is also concerned because she had two cardiac arrests in the last 3 months and has had two episodes of Pneumonia which she states occurred after she received the Pneumonia shot and I have explained to her this is unlikely to be the case She denies chest pain, dyspnea,orthopnea,pedal edema. Last visit to Cardiology was one month ago. She has no acute concerns today.  Past Medical History:  Diagnosis Date  . Acute congestive heart failure ([place])   . Acute diastolic CHF (congestive heart failure) ([place]) 09/14/2016  . Atrophic vaginitis 06/07/2012  . Avitaminosis D 07/28/2012  . CAD (coronary artery disease)    a. NSTEM 09/2016: cath showing severe diffuse disease of the RCA, ramus and Cx with mild-mod disease of LAD; PCI would require multiple stents and significant contrast usage thus medical therapy recommended.  . Cellulitis 07/12/2015  . Chest  pain 01/01/2017  . Chronic diastolic CHF (congestive heart failure) ([place])   . Chronic kidney disease 09/23/2016  . CKD (chronic kidney disease), stage IV ([place])   . COPD GOLD  0 08/31/2017   Quit smoking 07/17/17 - Spirometry 09/01/2017  FEV1 1.23 (58%)  Ratio 57 with mild curvature p no rx - 09/01/2017  After extensive coaching inhaler device,  effectiveness =    75% with elipta with cough provoked  - PFT's  10/14/2017  FEV1 1.69 (68 % ) ratio 84  p no % improvement from saba p nothing prior to study with DLCO  50 % corrects to 76  % for alv volume   - 10/14/2017  Walked RA x 3 laps @ 1  . Current tobacco use 06/10/2012  . Diabetes mellitus with neuropathy ([place])   . Diabetic foot ulcer ([place]) 07/13/2015  . Diabetic polyneuropathy associated with type 2 diabetes mellitus ([place]) 08/19/2017  . DOE (dyspnea on exertion) 09/01/2017   09/01/2017  Walked RA x 3 laps @ 185 ft each stopped due to  End of study, nl to mod fast  pace, no  desat   But stopped to rest x 2  Spirometry 09/01/2017  FEV1 1.23 (58%)  Ratio 57  > trial of anoro    . Essential (primary) hypertension 06/10/2012  . Foot amputation status    a. h/o left foot transmetatarsal amputation  . High cholesterol   . History of biliary T-tube placement 06/10/2012  . History of cardiac arrest   . HLD (hyperlipidemia) 09/02/2012   Overview:  ICD-10 cut over    .  Hypertension    a. patent renal arteries by PV angio 08/2015. b. heavy proteinuria 09/2016 ? nephrotic.  [name] Hypertensive heart disease with heart failure ([place])   . Hypertriglyceridemia 07/28/2012  . Leg pain 06/10/2012  . Normocytic anemia   . NSTEMI (non-ST elevated myocardial infarction) ([place]) 01/01/2017  . Obesity   . Onychomycosis 12/24/2015  . Peripheral nerve disease 09/02/2012  . Peripheral vascular disease ([place]) 07/28/2012  . Proteinuria   . PVD (peripheral vascular disease) ([place])    a. status post left SFA and popliteal stent and left SFA PTA with drug coated balloon in 08/2015.  . Status post  transmetatarsal amputation of foot, left ([place]) 09/06/2015  . Tobacco abuse   . Type 2 diabetes mellitus ([place]) 06/07/2012  . Type 2 diabetes mellitus with peripheral angiopathy ([place]) 09/02/2012    Past Surgical History:  Procedure Laterality Date  . ABDOMINAL HYSTERECTOMY    . AMPUTATION Left 09/06/2015   Procedure: LEFT TRANSMETATARSAL AMPUTATTION;  Surgeon: Newt Minion, MD;  Location: [facility];  Service: Orthopedics;  Laterality: Left;  . LEFT HEART CATH AND CORONARY ANGIOGRAPHY N/A 09/17/2016   Procedure: LEFT HEART CATH AND CORONARY ANGIOGRAPHY;  Surgeon: Jettie Booze, MD;  Location: [facility];  Service: Cardiovascular;  Laterality: N/A;  . PERIPHERAL VASCULAR CATHETERIZATION N/A 08/16/2015   Procedure: Abdominal Aortogram;  Surgeon: Elam Dutch, MD;  Location: [facility];  Service: Cardiovascular;  Laterality: N/A;  . PERIPHERAL VASCULAR CATHETERIZATION Bilateral 08/16/2015   Procedure: Lower Extremity Angiography;  Surgeon: Elam Dutch, MD;  Location: [facility];  Service: Cardiovascular;  Laterality: Bilateral;  . PERIPHERAL VASCULAR CATHETERIZATION Left 08/16/2015   Procedure: Peripheral Vascular Intervention;  Surgeon: Elam Dutch, MD;  Location: [facility];  Service: Cardiovascular;  Laterality: Left;  SFA STENT X 2    No Known Allergies   Outpatient Medications Prior to Visit  Medication Sig Dispense Refill  . amLODipine (NORVASC) 10 MG tablet Take 1 tablet (10 mg total) by mouth daily. Reported on 07/13/2015 90 tablet 2  . Ascorbic Acid (VITAMIN C) 1000 MG tablet Take 1,000 mg by mouth daily.    [name] aspirin 81 MG EC tablet Take 1 tablet (81 mg total) by mouth daily. Reported on 07/13/2015 90 tablet 0  . atorvastatin (LIPITOR) 80 MG tablet Take 1 tablet (80 mg total) by mouth daily. 90 tablet 2  . Blood Glucose Monitoring Suppl (ACCU-CHEK AVIVA) device Use as instructed three times daily. 1 each 0  . carvedilol (COREG) 25 MG tablet Take 1  tablet (25 mg total) by mouth 2 (two) times daily. 180 tablet 2  . Cholecalciferol (VITAMIN D-3) 1000 units CAPS Take 1,000 Units by mouth daily.     . clopidogrel (PLAVIX) 75 MG tablet Take 1 tablet (75 mg total) by mouth daily. 90 tablet 1  . docusate sodium (COLACE) 100 MG capsule Take 100 mg by mouth 2 (two) times daily.    . furosemide (LASIX) 40 MG tablet Take 2 tabs (80 mg total) by mouth at 8 am, then take 1 tab (40 mg total) by mouth at 2 pm everyday. 90 tablet 1  . gabapentin (NEURONTIN) 300 MG capsule Take 1 capsule (300 mg total) by mouth 3 (three) times daily. 90 capsule 3  . glipiZIDE (GLUCOTROL) 10 MG tablet Take 1 tablet (10 mg total) by mouth daily before breakfast. 90 tablet 1  . glucose blood (ACCU-CHEK AVIVA) test strip Use as instructed three  times daily before meals. 100 each 12  . isosorbide mononitrate (IMDUR) 30 MG 24 hr tablet Take 3 tablets (90 mg total) by mouth daily. (Patient taking differently: Take 60 mg by mouth daily. ) 270 tablet 1  . Lancet Devices (ACCU-CHEK SOFTCLIX) lancets Use as instructed three times daily before meals. 1 each 5  . latanoprost (XALATAN) 0.005 % ophthalmic solution 1 drop at bedtime.    . Omega-3 Fatty Acids (FISH OIL) 1000 MG CAPS Take 2,000 mg by mouth daily. Reported on 07/13/2015    . ranolazine (RANEXA) 500 MG 12 hr tablet Take 1 tablet (500 mg total) by mouth 2 (two) times daily. 180 tablet 3  . sitaGLIPtin (JANUVIA) 25 MG tablet Take 1 tablet (25 mg total) by mouth daily. 30 tablet 3  . hydrALAZINE (APRESOLINE) 50 MG tablet Take 2 tablets (100 mg total) by mouth 2 (two) times daily. 360 tablet 1  . nitroGLYCERIN (NITROSTAT) 0.4 MG SL tablet Place 1 tablet (0.4 mg total) under the tongue every 5 (five) minutes as needed for chest pain. 25 tablet 3   No facility-administered medications prior to visit.     ROS Review of Systems  Constitutional: Negative for activity change, appetite change and fatigue.  HENT: Negative for congestion,  sinus pressure and sore throat.   Eyes: Negative for visual disturbance.  Respiratory: Negative for cough, chest tightness, shortness of breath and wheezing.   Cardiovascular: Negative for chest pain and palpitations.  Gastrointestinal: Negative for abdominal distention, abdominal pain and constipation.  Endocrine: Negative for polydipsia.  Genitourinary: Negative for dysuria and frequency.  Musculoskeletal: Negative for arthralgias and back pain.  Skin: Negative for rash.  Neurological: Negative for tremors, light-headedness and numbness.  Hematological: Does not bruise/bleed easily.  Psychiatric/Behavioral: Negative for agitation and behavioral problems.    Objective:  BP (!) 162/70   Pulse 87   Temp 97.9 F (36.6 C) (Oral)   Ht 5\' 4"  (1.626 m)   Wt 199 lb 9.6 oz (90.5 kg)   SpO2 97%   BMI 34.26 kg/m   BP/Weight 11/17/2017 11/05/2017 26/08/3417  Systolic BP 622 297 989  Diastolic BP 70 64 78  Wt. (Lbs) 199.6 194 197.2  BMI 34.26 33.3 33.85   Wt Readings from Last 3 Encounters:  11/17/17 199 lb 9.6 oz (90.5 kg)  11/05/17 194 lb (88 kg)  11/05/17 197 lb 3.2 oz (89.4 kg)     Physical Exam  Constitutional: She is oriented to person, place, and time. She appears well-developed and well-nourished.  Cardiovascular: Normal rate and normal heart sounds.  No murmur heard. B/l dorsalis pedis- non palpable  Pulmonary/Chest: Effort normal and breath sounds normal. She has no wheezes. She has no rales. She exhibits no tenderness.  Abdominal: Soft. Bowel sounds are normal. She exhibits no distension and no mass. There is no tenderness.  Musculoskeletal: Normal range of motion.  Neurological: She is alert and oriented to person, place, and time.  Skin: Skin is warm and dry.  Sacral ulcer stage 1 no discharge  Psychiatric: She has a normal mood and affect.    Lab Results  Component Value Date   HGBA1C 6.8 (A) 10/07/2017     Assessment & Plan:   1. Type 2 diabetes mellitus  with peripheral angiopathy (HCC) Controlled Continue current regimen Diabetic diet,lifestyle modification - POCT glucose (manual entry)  2. Peripheral vascular disease ([place]) status post left SFA and popliteal artery stent and left SFA PTA with drug-coated balloon in 08/2015 No claudication  pain  3. Essential (primary) hypertension Uncontrolled Increase Hydralazine dose Counseled on blood pressure goal of less than 130/80, low-sodium, DASH diet, medication compliance, 150 minutes of moderate intensity exercise per week. Discussed medication compliance, adverse effects. - hydrALAZINE (APRESOLINE) 100 MG tablet; Take 1 tablet (100 mg total) by mouth 3 (three) times daily.  Dispense: 90 tablet; Refill: 1 - 24 hour blood pressure monitor; Future  4. Chronic diastolic CHF (congestive heart failure) (HCC) Echo revealed EF 49%, severely hypokinetic to akinetic inferior and lateral wall of LV with MR from outside Hospital Continue medications, restrict fluid intake,daily weights Follow up with Cardiology  - For home use only DME Pulse oximeter  5. Stage 4 chronic kidney disease (HCC) Combination of hypertensive and Diabetic Nephropathy  6. Skin ulcer of sacrum, limited to breakdown of skin (HCC) Healing Continue Duoderm   Meds ordered this encounter  Medications  . hydrALAZINE (APRESOLINE) 100 MG tablet    Sig: Take 1 tablet (100 mg total) by mouth 3 (three) times daily.    Dispense:  90 tablet    Refill:  1    Patient will pick-up as needed    Follow-up: Return in about 1 month (around 12/17/2017) for follow up of hypertension.   Charlott Rakes MD

## 2017-11-22 DIAGNOSIS — I469 Cardiac arrest, cause unspecified: Secondary | ICD-10-CM | POA: Insufficient documentation

## 2017-11-22 NOTE — Progress Notes (Signed)
Cardiology Office Note    Date:  11/23/2017   ID:  Rebekah Johnson, DOB Mar 30, 1960, MRN [id number]  PCP:  System, Pcp Not In  Cardiologist: Ena Dawley, MD EPS: None  Chief Complaint  Patient presents with  . Follow-up    History of Present Illness:  Rebekah Johnson is a 57 y.o. female with history of CAD status post NSTEMI 09/2016 with severe diffuse disease of the RCA, ramus and circumflex with mild to moderate disease of the LAD.  Medical therapy was recommended because multiple stents and significant and contrast would have to be used.  Also has chronic diastolic CHF, CKD stage III, DM with neuropathy and foot amputation, hypertension, HLD, PVD status post left SFA and popliteal stent and left SFA PTA with drug-coated balloon 08/2015, tobacco abuse.   Readmitted with NSTEMI and acute on chronic CHF 12/2016 managed medically.  .   Patient was hospitalized 08/23/2017 through 08/27/2017 at [facility] in [place], [place]  for severe respiratory distress/sepsis secondary to pneumonia.  She was treated with IV Rocephin and azithromycin.  BNP was elevated at 648 and she was given IV Lasix.  She was seen by cardiology because of elevated troponins-4.37.  She was felt to have an NSTEMI secondary to demand ischemia.  She also had a hypertensive emergency.  She was given a Plavix load and managed medically due to renal function.  Metoprolol was stopped and Coreg started along with Norvasc.  Creatinine was 3.0 at discharge.  Urine drug screen was positive for marijuana.   I saw her in follow-up 08/31/17 in which she was still having chest pain so added Ranexa 500 mg twice daily.  Also referred her to endocrinologist and pulmonary.  2D echo 09/02/2017 showed normal LV systolic function 50 to 34% with severe hypokinesis and scarring of the basal mid inferior lateral myocardium, grade 2 DD with moderate MR.   Saw Dr. [name] 09/03/2017 (3 days later)at which time she was up 7 pounds.  Lasix was  increased to 80 mg and 40 in the evening and Spironolactone 25 mg once daily was added.   Patient was in [place] hospital again 9/3 with pulmonary edema/repiratory failure on ventilator for 4 days. Cardiac arrest with asystole in ER successful resuscitation hypothermia protocol.  They were not sure whether or not she had an arrhythmia that preclude occluded this.  Echo severe HK and AK inf/lat wall with MR, septic shock, renal failure.  An echocardiogram performed 09/08/17 showed borderline reduced left ventricular systolic function with EF of 49% and severely hypokinetic to akinetic inferior and lateral wall of the left ventricle with mitral regurgitation.   Notes say she would need a cardiac catheterization once she stabilized but patient was adamant about being treated at [facility].  Since I last saw her 06/2017 she has been diagnosed with a thyroid goiter followed by Dr. Loanne Drilling.  30-day monitor showed normal sinus rhythm with no pauses or arrhythmias.  Normal monitor.  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  Kidney biopsy just showed scar so there is no reversibility.  GFR is 20.  They are considering kidney transplant.  Patient comes in today for follow-up.  She denies any further chest pain, palpitations, dizziness or presyncope.  She went to class to learn about peritoneal dialysis.  Asking for blood pressure cuff and pulse ox prescription.   Past Medical History:  Diagnosis Date  . Acute congestive heart failure ([place])   . Acute diastolic CHF (congestive heart  failure) ([place]) 09/14/2016  . Atrophic vaginitis 06/07/2012  . Avitaminosis D 07/28/2012  . CAD (coronary artery disease)    a. NSTEM 09/2016: cath showing severe diffuse disease of the RCA, ramus and Cx with mild-mod disease of LAD; PCI would require multiple stents and significant contrast usage thus medical therapy recommended.  . Cellulitis 07/12/2015  . Chest pain 01/01/2017  . Chronic diastolic CHF  (congestive heart failure) ([place])   . Chronic kidney disease 09/23/2016  . CKD (chronic kidney disease), stage IV ([place])   . COPD GOLD  0 08/31/2017   Quit smoking 07/17/17 - Spirometry 09/01/2017  FEV1 1.23 (58%)  Ratio 57 with mild curvature p no rx - 09/01/2017  After extensive coaching inhaler device,  effectiveness =    75% with elipta with cough provoked  - PFT's  10/14/2017  FEV1 1.69 (68 % ) ratio 84  p no % improvement from saba p nothing prior to study with DLCO  50 % corrects to 76  % for alv volume   - 10/14/2017  Walked RA x 3 laps @ 1  . Current tobacco use 06/10/2012  . Diabetes mellitus with neuropathy ([place])   . Diabetic foot ulcer ([place]) 07/13/2015  . Diabetic polyneuropathy associated with type 2 diabetes mellitus ([place]) 08/19/2017  . DOE (dyspnea on exertion) 09/01/2017   09/01/2017  Walked RA x 3 laps @ 185 ft each stopped due to  End of study, nl to mod fast  pace, no  desat   But stopped to rest x 2  Spirometry 09/01/2017  FEV1 1.23 (58%)  Ratio 57  > trial of anoro    . Essential (primary) hypertension 06/10/2012  . Foot amputation status    a. h/o left foot transmetatarsal amputation  . High cholesterol   . History of biliary T-tube placement 06/10/2012  . History of cardiac arrest   . HLD (hyperlipidemia) 09/02/2012   Overview:  ICD-10 cut over    . Hypertension    a. patent renal arteries by PV angio 08/2015. b. heavy proteinuria 09/2016 ? nephrotic.  [name] Hypertensive heart disease with heart failure ([place])   . Hypertriglyceridemia 07/28/2012  . Leg pain 06/10/2012  . Normocytic anemia   . NSTEMI (non-ST elevated myocardial infarction) ([place]) 01/01/2017  . Obesity   . Onychomycosis 12/24/2015  . Peripheral nerve disease 09/02/2012  . Peripheral vascular disease ([place]) 07/28/2012  . Proteinuria   . PVD (peripheral vascular disease) ([place])    a. status post left SFA and popliteal stent and left SFA PTA with drug coated balloon in 08/2015.  . Status post transmetatarsal amputation of foot, left  ([place]) 09/06/2015  . Tobacco abuse   . Type 2 diabetes mellitus ([place]) 06/07/2012  . Type 2 diabetes mellitus with peripheral angiopathy ([place]) 09/02/2012    Past Surgical History:  Procedure Laterality Date  . ABDOMINAL HYSTERECTOMY    . AMPUTATION Left 09/06/2015   Procedure: LEFT TRANSMETATARSAL AMPUTATTION;  Surgeon: Newt Minion, MD;  Location: [facility];  Service: Orthopedics;  Laterality: Left;  . LEFT HEART CATH AND CORONARY ANGIOGRAPHY N/A 09/17/2016   Procedure: LEFT HEART CATH AND CORONARY ANGIOGRAPHY;  Surgeon: Jettie Booze, MD;  Location: [facility];  Service: Cardiovascular;  Laterality: N/A;  . PERIPHERAL VASCULAR CATHETERIZATION N/A 08/16/2015   Procedure: Abdominal Aortogram;  Surgeon: Elam Dutch, MD;  Location: [facility];  Service: Cardiovascular;  Laterality: N/A;  . PERIPHERAL VASCULAR CATHETERIZATION Bilateral 08/16/2015   Procedure: Lower Extremity  Angiography;  Surgeon: Elam Dutch, MD;  Location: [facility];  Service: Cardiovascular;  Laterality: Bilateral;  . PERIPHERAL VASCULAR CATHETERIZATION Left 08/16/2015   Procedure: Peripheral Vascular Intervention;  Surgeon: Elam Dutch, MD;  Location: [facility];  Service: Cardiovascular;  Laterality: Left;  SFA STENT X 2    Current Medications: Current Meds  Medication Sig  . amLODipine (NORVASC) 10 MG tablet Take 1 tablet (10 mg total) by mouth daily. Reported on 07/13/2015  . Ascorbic Acid (VITAMIN C) 1000 MG tablet Take 1,000 mg by mouth daily.  [name] aspirin 81 MG EC tablet Take 1 tablet (81 mg total) by mouth daily. Reported on 07/13/2015  . atorvastatin (LIPITOR) 80 MG tablet Take 1 tablet (80 mg total) by mouth daily.  . Blood Glucose Monitoring Suppl (ACCU-CHEK AVIVA) device Use as instructed three times daily.  . carvedilol (COREG) 25 MG tablet Take 1 tablet (25 mg total) by mouth 2 (two) times daily.  . Cholecalciferol (VITAMIN D-3) 1000 units CAPS Take 1,000 Units by mouth  daily.   . clopidogrel (PLAVIX) 75 MG tablet Take 1 tablet (75 mg total) by mouth daily.  [name] docusate sodium (COLACE) 100 MG capsule Take 100 mg by mouth 2 (two) times daily.  . furosemide (LASIX) 40 MG tablet Take 2 tabs (80 mg total) by mouth at 8 am, then take 1 tab (40 mg total) by mouth at 2 pm everyday.  . gabapentin (NEURONTIN) 300 MG capsule Take 1 capsule (300 mg total) by mouth 3 (three) times daily.  [name] glipiZIDE (GLUCOTROL) 10 MG tablet Take 10 mg by mouth 2 (two) times daily.  [name] glucose blood (ACCU-CHEK AVIVA) test strip Use as instructed three times daily before meals.  . hydrALAZINE (APRESOLINE) 100 MG tablet Take 1 tablet (100 mg total) by mouth 3 (three) times daily.  . isosorbide dinitrate (ISORDIL) 30 MG tablet Take 60 mg by mouth 2 (two) times daily.  Elmore Guise Devices (ACCU-CHEK SOFTCLIX) lancets Use as instructed three times daily before meals.  . latanoprost (XALATAN) 0.005 % ophthalmic solution 1 drop at bedtime.  . Omega-3 Fatty Acids (FISH OIL) 1000 MG CAPS Take 2,000 mg by mouth daily. Reported on 07/13/2015  . ranolazine (RANEXA) 500 MG 12 hr tablet Take 1 tablet (500 mg total) by mouth 2 (two) times daily.  . sitaGLIPtin (JANUVIA) 25 MG tablet Take 1 tablet (25 mg total) by mouth daily.     Allergies:   Patient has no known allergies.   Social History   Socioeconomic History  . Marital status: Married    Spouse name: Not on file  . Number of children: Not on file  . Years of education: Not on file  . Highest education level: Not on file  Occupational History  . Not on file  Social Needs  . Financial resource strain: Not on file  . Food insecurity:    Worry: Not on file    Inability: Not on file  . Transportation needs:    Medical: Not on file    Non-medical: Not on file  Tobacco Use  . Smoking status: Former Smoker    Packs/day: 1.00    Years: 42.00    Pack years: 42.00    Types: Cigarettes    Last attempt to quit: 07/17/2017    Years since quitting:  0.3  . Smokeless tobacco: Never Used  Substance and Sexual Activity  . Alcohol use: Yes    Alcohol/week: 1.0 - 2.0 standard  drinks    Types: 1 - 2 Glasses of wine per week    Comment: on social occassions  . Drug use: No  . Sexual activity: Not on file  Lifestyle  . Physical activity:    Days per week: Not on file    Minutes per session: Not on file  . Stress: Not on file  Relationships  . Social connections:    Talks on phone: Not on file    Gets together: Not on file    Attends religious service: Not on file    Active member of club or organization: Not on file    Attends meetings of clubs or organizations: Not on file    Relationship status: Not on file  Other Topics Concern  . Not on file  Social History Narrative  . Not on file     Family History:  The patient's family history includes Diabetes in her mother; Hypertension in her father.   ROS:   Please see the history of present illness.    Review of Systems  Constitution: Negative.  HENT: Negative.   Eyes: Negative.   Cardiovascular: Positive for leg swelling.  Respiratory: Negative.   Hematologic/Lymphatic: Negative.   Musculoskeletal: Negative.  Negative for joint pain.  Gastrointestinal: Negative.   Genitourinary: Negative.   Neurological: Negative.    All other systems reviewed and are negative.   PHYSICAL EXAM:   VS:  BP (!) 148/74   Pulse 71   Ht 5\' 4"  (1.626 m)   Wt 208 lb 6.4 oz (94.5 kg)   SpO2 100%   BMI 35.77 kg/m   Physical Exam  GEN: Obese, in no acute distress  Neck: no JVD, carotid bruits, or masses Cardiac:RRR; positive S4, 2/6 systolic murmur at the apex Respiratory:  clear to auscultation bilaterally, normal work of breathing GI: soft, nontender, nondistended, + BS Ext: without cyanosis, clubbing, or edema, Good distal pulses bilaterally Neuro:  Alert and Oriented x 3 Psych: euthymic mood, full affect  Wt Readings from Last 3 Encounters:  11/23/17 208 lb 6.4 oz (94.5 kg)    11/17/17 199 lb 9.6 oz (90.5 kg)  11/05/17 194 lb (88 kg)      Studies/Labs Reviewed:   EKG:  EKG is not ordered today.   Recent Labs: 01/01/2017: Magnesium 1.7 05/28/2017: NT-Pro BNP 1,155 06/04/2017: B Natriuretic Peptide 273.1 07/20/2017: Hemoglobin 10.8; Platelet Count 229 10/07/2017: ALT 14; BUN 33; Creatinine, Ser 3.04; Potassium 3.9; Sodium 141; TSH 0.853   Lipid Panel    Component Value Date/Time   CHOL 119 08/06/2017 0920   TRIG 307 (H) 08/06/2017 0920   HDL 30 (L) 08/06/2017 0920   CHOLHDL 4.0 08/06/2017 0920   CHOLHDL 3.7 09/16/2016 0559   VLDL 33 09/16/2016 0559   LDLCALC 28 08/06/2017 0920    Additional studies/ records that were reviewed today include:   2D echo [facility] 09/08/2017 An echocardiogram 09/08/17 showed borderline reduced left ventricular systolic function with EF of 49% and severely hypokinetic to akinetic inferior and lateral wall of the left ventricle with mitral regurgitation.    2D echo 09/02/2017 Study Conclusions   - Left ventricle: The cavity size was normal. There was mild   concentric hypertrophy. Systolic function was at the lower limits   of normal. The estimated ejection fraction was in the range of   50% to 55%. Severe hypokinesis and scarring of the   basal-midinferolateral myocardium; consistent with infarction in   the distribution of the left circumflex coronary  artery. Features   are consistent with a pseudonormal left ventricular filling   pattern, with concomitant abnormal relaxation and increased   filling pressure (grade 2 diastolic dysfunction). - Mitral valve: There was moderate regurgitation directed   eccentrically and posteriorly. - Left atrium: The atrium was mildly to moderately dilated.   Impressions:   - Compared to September 2018, the MR appears more significant.    30-day monitor 11/11/2017 normal with no arrhythmias    ASSESSMENT:    1. Coronary artery disease involving native coronary artery of  native heart without angina pectoris   2. Cardiac arrest ([place])   3. Essential (primary) hypertension   4. CKD (chronic kidney disease), stage IV ([place])   5. Mixed hyperlipidemia   6. Tobacco abuse      PLAN:  In order of problems listed above:  Asystole cardiac arrest 09/07/2017 [place] in the setting of pulmonary edema, respiratory failure and question of pneumonia with septic shock.  Patient was on a ventilator for 4 days.  LVEF 49% with severe hypokinesis to akinesis of the inferior and lateral walls of LV with mitral regurgitation.  This was not significantly different from echo 08/2017.  She also had acute renal failure.  They recommended cardiac catheterization at some point.  Creatinine over 3 and looking at dialysis in the future.  No recent chest pain.  Continue medical therapy.  Follow-up with Dr. [name] in 4 months.  Chronic systolic and diastolic CHF LVEF 37% at [facility] 09/2017 see above for details  CAD status post NSTEMI 09/2016 with severe diffuse disease in the RCA, ramus and circumflex, with mild to moderate disease in the LAD.  N STEMI 08/23/2017 [place], [place] in the setting of severe respiratory distress and sepsis secondary to pneumonia and CHF.  No cath because of AKI  Essential hypertension blood pressure up a little.  Hydralazine increased by community health last week.  I have given her prescription for blood pressure cuff and pulse ox.  CKD stage IV followed by [facility] considered for kidney transplant.Crt over 3-had a class on peritoneal dialysis  Hyperlipidemia on Lipitor, LDL 28 08/2017  Tobacco abuse quit cigarette smoking but smokes marijuana   Medication Adjustments/Labs and Tests Ordered: Current medicines are reviewed at length with the patient today.  Concerns regarding medicines are outlined above.  Medication changes, Labs and Tests ordered today are listed in the Patient  Instructions below. There are no Patient Instructions on file for this visit.   Sumner Boast, PA-C  11/23/2017 9:16 AM    [facility] [address], [place], [place]  [address] Phone: [phone]; Fax: [phone]

## 2017-11-23 ENCOUNTER — Ambulatory Visit (INDEPENDENT_AMBULATORY_CARE_PROVIDER_SITE_OTHER): Payer: Medicaid Other | Admitting: Physician Assistant

## 2017-11-23 ENCOUNTER — Encounter: Payer: Self-pay | Admitting: Physician Assistant

## 2017-11-23 VITALS — BP 148/74 | HR 71 | Ht 64.0 in | Wt 208.4 lb

## 2017-11-23 DIAGNOSIS — I469 Cardiac arrest, cause unspecified: Secondary | ICD-10-CM

## 2017-11-23 DIAGNOSIS — N184 Chronic kidney disease, stage 4 (severe): Secondary | ICD-10-CM

## 2017-11-23 DIAGNOSIS — I2510 Atherosclerotic heart disease of native coronary artery without angina pectoris: Secondary | ICD-10-CM

## 2017-11-23 DIAGNOSIS — E782 Mixed hyperlipidemia: Secondary | ICD-10-CM

## 2017-11-23 DIAGNOSIS — I10 Essential (primary) hypertension: Secondary | ICD-10-CM | POA: Diagnosis not present

## 2017-11-23 DIAGNOSIS — Z720 Tobacco use: Secondary | ICD-10-CM

## 2017-11-23 NOTE — Patient Instructions (Signed)
Medication Instructions:  Your physician recommends that you continue on your current medications as directed. Please refer to the Current Medication list given to you today.  If you need a refill on your cardiac medications before your next appointment, please call your pharmacy.   Lab work: None  If you have labs (blood work) drawn today and your tests are completely normal, you will receive your results only by: [name] MyChart Message (if you have MyChart) OR . A paper copy in the mail If you have any lab test that is abnormal or we need to change your treatment, we will call you to review the results.  Testing/Procedures: None  Follow-Up: At [facility], you and your health needs are our priority.  As part of our continuing mission to provide you with exceptional heart care, we have created designated Provider Care Teams.  These Care Teams include your primary Cardiologist (physician) and Advanced Practice Providers (APPs -  Physician Assistants and Nurse Practitioners) who all work together to provide you with the care you need, when you need it. You will need a follow up appointment in 3-4 months.  Please call our office 2 months in advance to schedule this appointment.  You may see Ena Dawley, MD or one of the following Advanced Practice Providers on your designated Care Team:   [place], PA-C Melina Copa, PA-C . Ermalinda Barrios, PA-C  Any Other Special Instructions Will Be Listed Below (If Applicable).

## 2017-11-29 ENCOUNTER — Ambulatory Visit: Payer: Medicaid Other | Admitting: Family Medicine

## 2017-12-08 ENCOUNTER — Encounter (HOSPITAL_COMMUNITY): Payer: Self-pay

## 2017-12-08 ENCOUNTER — Ambulatory Visit: Payer: Self-pay | Admitting: Vascular Surgery

## 2017-12-15 ENCOUNTER — Ambulatory Visit (INDEPENDENT_AMBULATORY_CARE_PROVIDER_SITE_OTHER): Payer: Medicare Other | Admitting: Vascular Surgery

## 2017-12-15 ENCOUNTER — Other Ambulatory Visit: Payer: Self-pay

## 2017-12-15 ENCOUNTER — Ambulatory Visit (HOSPITAL_COMMUNITY)
Admission: RE | Admit: 2017-12-15 | Discharge: 2017-12-15 | Disposition: A | Discharge status: Home / Self Care | Payer: Medicare Other | Source: Ambulatory Visit | Attending: Family | Admitting: Family

## 2017-12-15 ENCOUNTER — Ambulatory Visit (INDEPENDENT_AMBULATORY_CARE_PROVIDER_SITE_OTHER)
Admission: RE | Admit: 2017-12-15 | Discharge: 2017-12-15 | Disposition: A | Discharge status: Home / Self Care | Payer: Medicare Other | Source: Ambulatory Visit | Attending: Family | Admitting: Family

## 2017-12-15 ENCOUNTER — Encounter: Payer: Self-pay | Admitting: Vascular Surgery

## 2017-12-15 VITALS — BP 169/75 | HR 75 | Temp 97.7°F | Resp 14 | Ht 64.0 in | Wt 195.0 lb

## 2017-12-15 DIAGNOSIS — I739 Peripheral vascular disease, unspecified: Secondary | ICD-10-CM

## 2017-12-15 NOTE — Progress Notes (Signed)
Patient name: Rebekah Johnson MRN: [id number] DOB: Oct 24, 1960 Sex: female  REASON FOR VISIT:   Follow-up of peripheral vascular disease.  HPI:   Rebekah Johnson is a pleasant 57 y.o. female who I last saw on 06/09/2017.  The patient had originally presented with a wound on the left foot.  She underwent successful angioplasty of an occluded distal superficial femoral artery and popliteal artery by Dr. Juanda [place]rum[place].  She subsequently had transmetatarsal amputation by Dr. Sharol Given in 2017.  At the time of her last visit she had cut back to 5 cigarettes a day.  She was having some pain in her left foot at times but no clear-cut history of claudication or rest pain.  On the left side her ABI was down from 69% to 45%.  She had a moderate stenosis in the distal common femoral artery on the left and some disease in the proximal superficial femoral artery.  I did not recommend an aggressive approach to her femoral disease we discussed the importance of tobacco cessation.  She comes in for 34-month follow-up visit.  Since I saw the patient last, she denies any history of claudication, rest pain, or nonhealing ulcers.  SHE QUIT SMOKING IN JULY OF THIS YEAR!   The patient was seen by [facility] on 11/19/2017.  The patient has stage III chronic kidney disease.  Patient also has a history of coronary artery disease with severe two-vessel disease based on a heart cath from September 2018.  This is being treated medically.  Past Medical History:  Diagnosis Date  . Acute congestive heart failure ([place])   . Acute diastolic CHF (congestive heart failure) ([place]) 09/14/2016  . Atrophic vaginitis 06/07/2012  . Avitaminosis D 07/28/2012  . CAD (coronary artery disease)    a. NSTEM 09/2016: cath showing severe diffuse disease of the RCA, ramus and Cx with mild-mod disease of LAD; PCI would require multiple stents and significant contrast usage thus medical therapy recommended.  . Cellulitis 07/12/2015  . Chest  pain 01/01/2017  . Chronic diastolic CHF (congestive heart failure) ([place])   . Chronic kidney disease 09/23/2016  . CKD (chronic kidney disease), stage IV ([place])   . COPD GOLD  0 08/31/2017   Quit smoking 07/17/17 - Spirometry 09/01/2017  FEV1 1.23 (58%)  Ratio 57 with mild curvature p no rx - 09/01/2017  After extensive coaching inhaler device,  effectiveness =    75% with elipta with cough provoked  - PFT's  10/14/2017  FEV1 1.69 (68 % ) ratio 84  p no % improvement from saba p nothing prior to study with DLCO  50 % corrects to 76  % for alv volume   - 10/14/2017  Walked RA x 3 laps @ 1  . Current tobacco use 06/10/2012  . Diabetes mellitus with neuropathy ([place])   . Diabetic foot ulcer ([place]) 07/13/2015  . Diabetic polyneuropathy associated with type 2 diabetes mellitus ([place]) 08/19/2017  . DOE (dyspnea on exertion) 09/01/2017   09/01/2017  Walked RA x 3 laps @ 185 ft each stopped due to  End of study, nl to mod fast  pace, no  desat   But stopped to rest x 2  Spirometry 09/01/2017  FEV1 1.23 (58%)  Ratio 57  > trial of anoro    . Essential (primary) hypertension 06/10/2012  . Foot amputation status    a. h/o left foot transmetatarsal amputation  . High cholesterol   . History of biliary T-tube placement 06/10/2012  .  History of cardiac arrest   . HLD (hyperlipidemia) 09/02/2012   Overview:  ICD-10 cut over    . Hypertension    a. patent renal arteries by PV angio 08/2015. b. heavy proteinuria 09/2016 ? nephrotic.  [name] Hypertensive heart disease with heart failure ([place])   . Hypertriglyceridemia 07/28/2012  . Leg pain 06/10/2012  . Normocytic anemia   . NSTEMI (non-ST elevated myocardial infarction) ([place]) 01/01/2017  . Obesity   . Onychomycosis 12/24/2015  . Peripheral nerve disease 09/02/2012  . Peripheral vascular disease ([place]) 07/28/2012  . Proteinuria   . PVD (peripheral vascular disease) ([place])    a. status post left SFA and popliteal stent and left SFA PTA with drug coated balloon in 08/2015.  . Status post  transmetatarsal amputation of foot, left ([place]) 09/06/2015  . Tobacco abuse   . Type 2 diabetes mellitus ([place]) 06/07/2012  . Type 2 diabetes mellitus with peripheral angiopathy ([place]) 09/02/2012    Family History  Problem Relation Age of Onset  . Diabetes Mother   . Hypertension Father     SOCIAL HISTORY: Social History   Tobacco Use  . Smoking status: Former Smoker    Packs/day: 1.00    Years: 42.00    Pack years: 42.00    Types: Cigarettes    Last attempt to quit: 07/17/2017    Years since quitting: 0.4  . Smokeless tobacco: Never Used  Substance Use Topics  . Alcohol use: Yes    Alcohol/week: 1.0 - 2.0 standard drinks    Types: 1 - 2 Glasses of wine per week    Comment: on social occassions    No Known Allergies  Current Outpatient Medications  Medication Sig Dispense Refill  . amLODipine (NORVASC) 10 MG tablet Take 1 tablet (10 mg total) by mouth daily. Reported on 07/13/2015 90 tablet 2  . Ascorbic Acid (VITAMIN C) 1000 MG tablet Take 1,000 mg by mouth daily.    [name] aspirin 81 MG EC tablet Take 1 tablet (81 mg total) by mouth daily. Reported on 07/13/2015 90 tablet 0  . atorvastatin (LIPITOR) 80 MG tablet Take 1 tablet (80 mg total) by mouth daily. 90 tablet 2  . Blood Glucose Monitoring Suppl (ACCU-CHEK AVIVA) device Use as instructed three times daily. 1 each 0  . carvedilol (COREG) 25 MG tablet Take 1 tablet (25 mg total) by mouth 2 (two) times daily. 180 tablet 2  . Cholecalciferol (VITAMIN D-3) 1000 units CAPS Take 1,000 Units by mouth daily.     . clopidogrel (PLAVIX) 75 MG tablet Take 1 tablet (75 mg total) by mouth daily. 90 tablet 1  . docusate sodium (COLACE) 100 MG capsule Take 100 mg by mouth 2 (two) times daily.    . furosemide (LASIX) 40 MG tablet Take 2 tabs (80 mg total) by mouth at 8 am, then take 1 tab (40 mg total) by mouth at 2 pm everyday. 90 tablet 1  . gabapentin (NEURONTIN) 300 MG capsule Take 1 capsule (300 mg total) by mouth 3 (three) times daily. 90  capsule 3  . glipiZIDE (GLUCOTROL) 10 MG tablet Take 10 mg by mouth 2 (two) times daily.    [name] glucose blood (ACCU-CHEK AVIVA) test strip Use as instructed three times daily before meals. 100 each 12  . hydrALAZINE (APRESOLINE) 100 MG tablet Take 1 tablet (100 mg total) by mouth 3 (three) times daily. 90 tablet 1  . isosorbide dinitrate (ISORDIL) 30 MG tablet Take 60 mg by mouth 2 (two) times  daily.    . Lancet Devices (ACCU-CHEK SOFTCLIX) lancets Use as instructed three times daily before meals. 1 each 5  . latanoprost (XALATAN) 0.005 % ophthalmic solution 1 drop at bedtime.    . Omega-3 Fatty Acids (FISH OIL) 1000 MG CAPS Take 2,000 mg by mouth daily. Reported on 07/13/2015    . ranolazine (RANEXA) 500 MG 12 hr tablet Take 1 tablet (500 mg total) by mouth 2 (two) times daily. 180 tablet 3  . sitaGLIPtin (JANUVIA) 25 MG tablet Take 1 tablet (25 mg total) by mouth daily. 30 tablet 3  . nitroGLYCERIN (NITROSTAT) 0.4 MG SL tablet Place 1 tablet (0.4 mg total) under the tongue every 5 (five) minutes as needed for chest pain. 25 tablet 3   No current facility-administered medications for this visit.     REVIEW OF SYSTEMS:  [X]  denotes positive finding, [ ]  denotes negative finding Cardiac  Comments:  Chest pain or chest pressure:    Shortness of breath upon exertion: x   Short of breath when lying flat:    Irregular heart rhythm:        Vascular    Pain in calf, thigh, or hip brought on by ambulation:    Pain in feet at night that wakes you up from your sleep:     Blood clot in your veins:    Leg swelling:         Pulmonary    Oxygen at home:    Productive cough:     Wheezing:         Neurologic    Sudden weakness in arms or legs:     Sudden numbness in arms or legs:     Sudden onset of difficulty speaking or slurred speech:    Temporary loss of vision in one eye:     Problems with dizziness:         Gastrointestinal    Blood in stool:     Vomited blood:         Genitourinary      Burning when urinating:     Blood in urine:        Psychiatric    Major depression:         Hematologic    Bleeding problems:    Problems with blood clotting too easily:        Skin    Rashes or ulcers:        Constitutional    Fever or chills:     PHYSICAL EXAM:   Vitals:   12/15/17 1534  BP: (!) 169/75  Pulse: 75  Resp: 14  Temp: 97.7 F (36.5 C)  TempSrc: Oral  SpO2: 97%  Weight: 195 lb (88.5 kg)  Height: 5\' 4"  (1.626 m)    GENERAL: The patient is a well-nourished female, in no acute distress. The vital signs are documented above. CARDIAC: There is a regular rate and rhythm.  VASCULAR: I do not detect carotid bruits. She has palpable femoral pulses.  I cannot palpate pedal pulses. PULMONARY: There is good air exchange bilaterally without wheezing or rales. ABDOMEN: Soft and non-tender with normal pitched bowel sounds.  MUSCULOSKELETAL: There are no major deformities or cyanosis. NEUROLOGIC: No focal weakness or paresthesias are detected. SKIN: There are no ulcers or rashes noted. PSYCHIATRIC: The patient has a normal affect.  DATA:    ARTERIAL DOPPLER STUDY: I have independently interpreted her arterial Doppler study today.  On the left side there is a monophasic dorsalis  pedis and posterior tibial signal.  ABI is 65%.  She has a transmetatarsal amputation on the left.  On the right side there is a monophasic dorsalis pedis and posterior tibial signal.  ABI is 60% with a toe pressure of 59.  ARTERIAL DUPLEX: Arterial duplex of the left lower extremity today was obtained.  I have independently interpreted those results.  The patient has a greater than 75% stenosis in the left superficial femoral artery.  There is no evidence of significant stenosis in the distal superficial femoral artery where she also underwent angioplasty.  MEDICAL ISSUES:   PERIPHERAL VASCULAR DISEASE: The patient does have infrainguinal arterial occlusive disease bilaterally.  However  currently she has really no significant symptoms.  She denies claudication or rest pain.  Her ABIs are stable with an ABI of 60% on the right and 65% on the left.  She has no open wounds.  I congratulated her on her tobacco cessation.  I encouraged her to stay as active as possible.  We have also discussed the importance of nutrition.  I have ordered follow-up ABIs in 6 months and I will see her back at that time.  She knows to call sooner if she has problems.  Deitra Mayo [facility] [phone]

## 2017-12-20 ENCOUNTER — Ambulatory Visit: Payer: Medicaid Other | Admitting: Family Medicine

## 2018-01-08 DIAGNOSIS — Z8709 Personal history of other diseases of the respiratory system: Secondary | ICD-10-CM | POA: Insufficient documentation

## 2018-01-18 ENCOUNTER — Telehealth: Payer: Self-pay | Admitting: Cardiology

## 2018-01-18 NOTE — Telephone Encounter (Signed)
New message   Patient calling the office for samples of medication:   1.  What medication and dosage are you requesting samples for?ranolazine (RANEXA) 500 MG 12 hr tablet  2.  Are you currently out of this medication? yes

## 2018-01-18 NOTE — Telephone Encounter (Signed)
There is no alternative, I would make sure she takes her imdur. I am sorry, I know its expensive

## 2018-01-18 NOTE — Telephone Encounter (Signed)
New Message:     First thing, pt wants to know iff Dr [name] would like to see her before her scheduled appointment on 03-30-18. She said she  Thought she might want to see her, since she had been  In the hospital for a week, it was not heart related.  Next , she would like to know if it is  something else she can take in the place of her Ranexa, She said it is too expensive. She wants something to replace it, just until her insurance can kicked back in please

## 2018-01-18 NOTE — Telephone Encounter (Signed)
Dr. [name] please review pts question about follow-up and changing her regimen of Ranexa, to something more affordable.  Pt was seen by Estella Husk PA-C in November 2019, and she was advised to follow-up with you in March, so that follow-up time frame is appropriate if you choose.  Pt states she was admitted to the hospital for non-cardiac related issues, but there is no mention in her chart of a recent hospitalization. Please advise.  I currently have her scheduled with you, for the appropriate time frame, for 03/30/18.  Thanks!

## 2018-01-18 NOTE — Telephone Encounter (Signed)
Informed the pt that per Dr [name], there is no alternative, but she should make sure she is taking her imdur.  Did advise the pt to reach out to medicare, and ask them if their is a preferred pharmacy she could you to help accommodate cost of her meds, or is there any program/assistance medicate can offer, to help assist with the cost of her meds.  Informed the pt that we will glad to help her out with any paperwork needed, if medicare is requiring for a Physician signature.  Advised the pt also to keep her follow-up as planned, for March.  Pt verbalized understanding and agrees with this plan. Pt states she will call them now.

## 2018-01-18 NOTE — Telephone Encounter (Signed)
Called pt to inform her that we do not have samples of Ranexa and that she has refills left at her pharmacy and if she has any other problems, questions or concerns to call our office back. Pt verbalized understanding.

## 2018-01-31 ENCOUNTER — Ambulatory Visit (INDEPENDENT_AMBULATORY_CARE_PROVIDER_SITE_OTHER): Payer: Medicaid Other | Admitting: Orthopedic Surgery

## 2018-02-09 ENCOUNTER — Telehealth (INDEPENDENT_AMBULATORY_CARE_PROVIDER_SITE_OTHER): Payer: Self-pay | Admitting: Orthopedic Surgery

## 2018-02-09 NOTE — Telephone Encounter (Signed)
Patient would like to know what size her compression socks. She stated she is trying to buy some and not sure what size is preferred.

## 2018-02-10 ENCOUNTER — Other Ambulatory Visit (HOSPITAL_COMMUNITY): Payer: Self-pay

## 2018-02-10 ENCOUNTER — Encounter: Payer: Medicaid Other | Admitting: Dietician

## 2018-02-10 ENCOUNTER — Telehealth: Payer: Self-pay | Admitting: Endocrinology

## 2018-02-10 ENCOUNTER — Ambulatory Visit: Payer: Medicaid Other | Admitting: Endocrinology

## 2018-02-10 NOTE — Telephone Encounter (Signed)
Patient no showed today's appt. Please advise on how to follow up. °A. No follow up necessary. °B. Follow up urgent. Contact patient immediately. °C. Follow up necessary. Contact patient and schedule visit in ___ days. °D. Follow up advised. Contact patient and schedule visit in ____weeks. ° °Would you like the NS fee to be applied to this visit? ° °

## 2018-02-10 NOTE — Telephone Encounter (Signed)
Please schedule f/u appt for next available appointment  

## 2018-02-10 NOTE — Telephone Encounter (Signed)
Please refer to Dr. Ellison's response 

## 2018-02-10 NOTE — Telephone Encounter (Signed)
Rescheduled missed appointment for 02/22/18 at 11:15 a.m.

## 2018-02-11 ENCOUNTER — Ambulatory Visit (HOSPITAL_COMMUNITY)
Admission: RE | Admit: 2018-02-11 | Discharge: 2018-02-11 | Disposition: A | Discharge status: Home / Self Care | Payer: Medicare HMO | Source: Ambulatory Visit | Attending: Nephrology | Admitting: Nephrology

## 2018-02-11 DIAGNOSIS — N189 Chronic kidney disease, unspecified: Secondary | ICD-10-CM | POA: Insufficient documentation

## 2018-02-11 DIAGNOSIS — D631 Anemia in chronic kidney disease: Secondary | ICD-10-CM | POA: Insufficient documentation

## 2018-02-11 MED ORDER — SODIUM CHLORIDE 0.9 % IV SOLN
510.0000 mg | INTRAVENOUS | Status: DC
  Administered 2018-02-11: 510 mg via INTRAVENOUS
  Filled 2018-02-11: qty 510

## 2018-02-11 NOTE — Telephone Encounter (Signed)
I called and lm on vm to advise that they will measure her at [facility] for size.

## 2018-02-14 ENCOUNTER — Other Ambulatory Visit: Payer: Self-pay | Admitting: Cardiology

## 2018-02-14 MED ORDER — RANOLAZINE ER 500 MG PO TB12: 500.0000 mg | ORAL_TABLET | Freq: Two times a day (BID) | ORAL | 3 refills | Status: DC

## 2018-02-17 ENCOUNTER — Ambulatory Visit (INDEPENDENT_AMBULATORY_CARE_PROVIDER_SITE_OTHER): Payer: Medicare HMO | Admitting: Orthopedic Surgery

## 2018-02-17 ENCOUNTER — Encounter (INDEPENDENT_AMBULATORY_CARE_PROVIDER_SITE_OTHER): Payer: Self-pay | Admitting: Orthopedic Surgery

## 2018-02-17 VITALS — Ht 64.0 in | Wt 202.0 lb

## 2018-02-17 DIAGNOSIS — Z89432 Acquired absence of left foot: Secondary | ICD-10-CM

## 2018-02-17 DIAGNOSIS — E1142 Type 2 diabetes mellitus with diabetic polyneuropathy: Secondary | ICD-10-CM

## 2018-02-17 DIAGNOSIS — M79672 Pain in left foot: Secondary | ICD-10-CM

## 2018-02-17 DIAGNOSIS — B351 Tinea unguium: Secondary | ICD-10-CM

## 2018-02-17 DIAGNOSIS — L97421 Non-pressure chronic ulcer of left heel and midfoot limited to breakdown of skin: Secondary | ICD-10-CM

## 2018-02-17 NOTE — Progress Notes (Signed)
Office Visit Note   Patient: Rebekah Johnson           Date of Birth: Aug 09, 1960           MRN: [id number] Visit Date: 02/17/2018              Requested by: No referring provider defined for this encounter. PCP: System, Pcp Not In  Chief Complaint  Patient presents with  . Left Foot - Follow-up  . Right Foot - Follow-up      HPI: Patient is a 58 year old woman who presents for a left transmetatarsal Wegner grade 1 ulcer.  She also complains of painful onychomycotic nails in the right foot and complains of venous insufficiency bilaterally.  Patient states she was recently admitted for swelling in her legs due to renal failure.  Assessment & Plan: Visit Diagnoses:  1. Status post transmetatarsal amputation of foot, left ([place])   2. Onychomycosis   3. Pain in left foot     Plan: Patient was given a prescription for paramedian knee-high 15 to 20 mmHg compression stockings her nails were trimmed on the right ulcer debrided on the left recommended stiff soled sneakers to unload pressure from the forefoot on the left.  Follow-Up Instructions: Return in about 3 months (around 05/18/2018).   Ortho Exam  Patient is alert, oriented, no adenopathy, well-dressed, normal affect, normal respiratory effort. Examination patient has a Wegner grade 1 ulcer on the right transmetatarsal amputation of the left foot.  She is very flexible sneakers and is unloading the forefoot.  After informed consent a 10 blade knife was used to debride the skin and soft tissue back to healthy viable granulation tissue there is no ulcer no abscess no exposed bone or tendon.  On the right foot she has thickened discolored onychomycotic nails x5 she is unable to safely trim the nails on her own due to her diabetic insensate neuropathy and the nails were trimmed x5.  She has venous stasis swelling in both legs calf measures 35 cm in circumference and recommended medium knee-high compression stockings.  Imaging: No  results found. No images are attached to the encounter.  Labs: Lab Results  Component Value Date   HGBA1C 6.8 (A) 10/07/2017   HGBA1C 7.4 (A) 08/05/2017   HGBA1C 7.8 12/31/2016   ESRSEDRATE 57 (H) 07/13/2015   CRP 0.7 07/13/2015   LABURIC 5.7 07/13/2015   REPTSTATUS 01/04/2017 FINAL 01/04/2017   CULT (A) 01/01/2017    VIRIDANS STREPTOCOCCUS THE SIGNIFICANCE OF ISOLATING THIS ORGANISM FROM A SINGLE SET OF BLOOD CULTURES WHEN MULTIPLE SETS ARE DRAWN IS UNCERTAIN. PLEASE NOTIFY THE MICROBIOLOGY DEPARTMENT WITHIN ONE WEEK IF SPECIATION AND SENSITIVITIES ARE REQUIRED. Performed at [facility], [place] [address] [place], [place] [address]      Lab Results  Component Value Date   ALBUMIN 4.0 10/07/2017   ALBUMIN 3.3 (L) 07/20/2017   ALBUMIN 3.7 01/01/2017   LABURIC 5.7 07/13/2015    Body mass index is 34.67 kg/m.  Orders:  No orders of the defined types were placed in this encounter.  No orders of the defined types were placed in this encounter.    Procedures: No procedures performed  Clinical Data: No additional findings.  ROS:  All other systems negative, except as noted in the HPI. Review of Systems  Objective: Vital Signs: Ht 5\' 4"  (1.626 m)   Wt 202 lb (91.6 kg)   BMI 34.67 kg/m   Specialty Comments:  No specialty comments available.  [place]  History: Patient Active Problem List   Diagnosis Date Noted  . Cardiac arrest ([place]) 11/22/2017  . Thyroid mass 11/05/2017  . DOE (dyspnea on exertion) 09/01/2017  . COPD GOLD  0 08/31/2017  . Diabetic polyneuropathy associated with type 2 diabetes mellitus ([place]) 08/19/2017  . NSTEMI (non-ST elevated myocardial infarction) ([place]) 01/01/2017  . Chest pain 01/01/2017  . Tobacco abuse   . PVD (peripheral vascular disease) ([place])   . Proteinuria   . Obesity   . Normocytic anemia   . Hypertension   . High cholesterol   . Foot amputation status   . Diabetes mellitus with neuropathy ([place])   . CKD (chronic kidney  disease), stage IV ([place])   . Chronic diastolic CHF (congestive heart failure) ([place])   . CAD (coronary artery disease) 10/27/2016  . Chronic kidney disease 09/23/2016  . Acute congestive heart failure ([place])   . Hypertensive heart disease with heart failure ([place])   . Chronic combined systolic and diastolic heart failure ([place]) 09/14/2016  . Onychomycosis 12/24/2015  . Status post transmetatarsal amputation of foot, left ([place]) 09/06/2015  . Diabetic foot ulcer ([place]) 07/13/2015  . Cellulitis 07/12/2015  . HLD (hyperlipidemia) 09/02/2012  . Peripheral nerve disease 09/02/2012  . Type 2 diabetes mellitus with peripheral angiopathy ([place]) 09/02/2012  . Hypertriglyceridemia 07/28/2012  . Peripheral vascular disease ([place]) 07/28/2012  . Avitaminosis D 07/28/2012  . Essential (primary) hypertension 06/10/2012  . History of biliary T-tube placement 06/10/2012  . Leg pain 06/10/2012  . Current tobacco use 06/10/2012  . Atrophic vaginitis 06/07/2012  . Type 2 diabetes mellitus ([place] [employer]) 06/07/2012   Past Medical History:  Diagnosis Date  . Acute congestive heart failure ([place])   . Acute diastolic CHF (congestive heart failure) ([place]) 09/14/2016  . Atrophic vaginitis 06/07/2012  . Avitaminosis D 07/28/2012  . CAD (coronary artery disease)    a. NSTEM 09/2016: cath showing severe diffuse disease of the RCA, ramus and Cx with mild-mod disease of LAD; PCI would require multiple stents and significant contrast usage thus medical therapy recommended.  . Cellulitis 07/12/2015  . Chest pain 01/01/2017  . Chronic diastolic CHF (congestive heart failure) ([place])   . Chronic kidney disease 09/23/2016  . CKD (chronic kidney disease), stage IV ([place])   . COPD GOLD  0 08/31/2017   Quit smoking 07/17/17 - Spirometry 09/01/2017  FEV1 1.23 (58%)  Ratio 57 with mild curvature p no rx - 09/01/2017  After extensive coaching inhaler device,  effectiveness =    75% with elipta with cough provoked  - PFT's  10/14/2017  FEV1 1.69 (68 % )  ratio 84  p no % improvement from saba p nothing prior to study with DLCO  50 % corrects to 76  % for alv volume   - 10/14/2017  Walked RA x 3 laps @ 1  . Current tobacco use 06/10/2012  . Diabetes mellitus with neuropathy ([place])   . Diabetic foot ulcer ([place]) 07/13/2015  . Diabetic polyneuropathy associated with type 2 diabetes mellitus ([place]) 08/19/2017  . DOE (dyspnea on exertion) 09/01/2017   09/01/2017  Walked RA x 3 laps @ 185 ft each stopped due to  End of study, nl to mod fast  pace, no  desat   But stopped to rest x 2  Spirometry 09/01/2017  FEV1 1.23 (58%)  Ratio 57  > trial of anoro    . Essential (primary) hypertension 06/10/2012  . Foot amputation status    a. h/o left foot transmetatarsal amputation  .  High cholesterol   . History of biliary T-tube placement 06/10/2012  . History of cardiac arrest   . HLD (hyperlipidemia) 09/02/2012   Overview:  ICD-10 cut over    . Hypertension    a. patent renal arteries by PV angio 08/2015. b. heavy proteinuria 09/2016 ? nephrotic.  [name] Hypertensive heart disease with heart failure ([place])   . Hypertriglyceridemia 07/28/2012  . Leg pain 06/10/2012  . Normocytic anemia   . NSTEMI (non-ST elevated myocardial infarction) ([place]) 01/01/2017  . Obesity   . Onychomycosis 12/24/2015  . Peripheral nerve disease 09/02/2012  . Peripheral vascular disease ([place]) 07/28/2012  . Proteinuria   . PVD (peripheral vascular disease) ([place])    a. status post left SFA and popliteal stent and left SFA PTA with drug coated balloon in 08/2015.  . Status post transmetatarsal amputation of foot, left ([place]) 09/06/2015  . Tobacco abuse   . Type 2 diabetes mellitus ([place]) 06/07/2012  . Type 2 diabetes mellitus with peripheral angiopathy ([place]) 09/02/2012    Family History  Problem Relation Age of Onset  . Diabetes Mother   . Hypertension Father     Past Surgical History:  Procedure Laterality Date  . ABDOMINAL HYSTERECTOMY    . AMPUTATION Left 09/06/2015   Procedure: LEFT TRANSMETATARSAL  AMPUTATTION;  Surgeon: Newt Minion, MD;  Location: [facility];  Service: Orthopedics;  Laterality: Left;  . LEFT HEART CATH AND CORONARY ANGIOGRAPHY N/A 09/17/2016   Procedure: LEFT HEART CATH AND CORONARY ANGIOGRAPHY;  Surgeon: Jettie Booze, MD;  Location: [facility];  Service: Cardiovascular;  Laterality: N/A;  . PERIPHERAL VASCULAR CATHETERIZATION N/A 08/16/2015   Procedure: Abdominal Aortogram;  Surgeon: Elam Dutch, MD;  Location: [facility];  Service: Cardiovascular;  Laterality: N/A;  . PERIPHERAL VASCULAR CATHETERIZATION Bilateral 08/16/2015   Procedure: Lower Extremity Angiography;  Surgeon: Elam Dutch, MD;  Location: [facility];  Service: Cardiovascular;  Laterality: Bilateral;  . PERIPHERAL VASCULAR CATHETERIZATION Left 08/16/2015   Procedure: Peripheral Vascular Intervention;  Surgeon: Elam Dutch, MD;  Location: [facility];  Service: Cardiovascular;  Laterality: Left;  SFA STENT X 2   Social History   Occupational History  . Not on file  Tobacco Use  . Smoking status: Former Smoker    Packs/day: 1.00    Years: 42.00    Pack years: 42.00    Types: Cigarettes    Last attempt to quit: 07/17/2017    Years since quitting: 0.5  . Smokeless tobacco: Never Used  Substance and Sexual Activity  . Alcohol use: Yes    Alcohol/week: 1.0 - 2.0 standard drinks    Types: 1 - 2 Glasses of wine per week    Comment: on social occassions  . Drug use: No  . Sexual activity: Not on file

## 2018-02-18 ENCOUNTER — Ambulatory Visit (HOSPITAL_COMMUNITY)
Admission: RE | Admit: 2018-02-18 | Discharge: 2018-02-18 | Disposition: A | Discharge status: Home / Self Care | Payer: Medicare HMO | Source: Ambulatory Visit | Attending: Nephrology | Admitting: Nephrology

## 2018-02-18 DIAGNOSIS — D631 Anemia in chronic kidney disease: Secondary | ICD-10-CM | POA: Diagnosis not present

## 2018-02-18 DIAGNOSIS — N189 Chronic kidney disease, unspecified: Secondary | ICD-10-CM | POA: Diagnosis present

## 2018-02-18 MED ORDER — SODIUM CHLORIDE 0.9 % IV SOLN
510.0000 mg | INTRAVENOUS | Status: AC
  Administered 2018-02-18: 510 mg via INTRAVENOUS
  Filled 2018-02-18: qty 510

## 2018-02-22 ENCOUNTER — Telehealth: Payer: Self-pay | Admitting: Endocrinology

## 2018-02-22 ENCOUNTER — Ambulatory Visit: Payer: Medicare Other | Admitting: Endocrinology

## 2018-02-22 NOTE — Telephone Encounter (Signed)
Please schedule f/u appt for next available appointment  

## 2018-02-22 NOTE — Telephone Encounter (Signed)
Please refer to Dr. Ellison's response 

## 2018-02-22 NOTE — Telephone Encounter (Signed)
Patient no showed today's appt. Please advise on how to follow up. °A. No follow up necessary. °B. Follow up urgent. Contact patient immediately. °C. Follow up necessary. Contact patient and schedule visit in ___ days. °D. Follow up advised. Contact patient and schedule visit in ____weeks. ° °Would you like the NS fee to be applied to this visit? ° °

## 2018-02-23 NOTE — Telephone Encounter (Signed)
Patient rescheduled for missed appointment on 03/03/18 at 2:15 p.m.

## 2018-03-03 ENCOUNTER — Ambulatory Visit (INDEPENDENT_AMBULATORY_CARE_PROVIDER_SITE_OTHER): Payer: Medicare HMO | Admitting: Endocrinology

## 2018-03-03 ENCOUNTER — Encounter: Payer: Self-pay | Admitting: Endocrinology

## 2018-03-03 VITALS — BP 120/60 | HR 66 | Ht 64.0 in | Wt 200.2 lb

## 2018-03-03 DIAGNOSIS — E1151 Type 2 diabetes mellitus with diabetic peripheral angiopathy without gangrene: Secondary | ICD-10-CM | POA: Diagnosis not present

## 2018-03-03 LAB — POCT GLYCOSYLATED HEMOGLOBIN (HGB A1C): Hemoglobin A1C: 6.9 % — AB (ref 4.0–5.6)

## 2018-03-03 MED ORDER — GLIPIZIDE 10 MG PO TABS: 10.0000 mg | ORAL_TABLET | Freq: Every day | ORAL | 0 refills | Status: DC

## 2018-03-03 NOTE — Progress Notes (Signed)
Subjective:    Patient ID: Rebekah Johnson, female    DOB: [birth year], 58 y.o.   MRN: [id number]  HPI Pt returns for f/u of diabetes mellitus: DM type: 2 Dx'ed: 4944 Complications: polyneuropathy, PAD, CAD, renal failure, and foot ulcer.  Therapy: 2 oral meds GDM: G0 DKA: never Severe hypoglycemia: never Pancreatitis: never Pancreatic imaging: normal on 2011 CT Other: she has never been on insulin.  Interval history: due to cbg's in the high-100's, she re-increased the glipizide to BID.  pt states she feels well in general.   Pt also hs MNG (dx'ed 2011; bx is 2014 was Beth cat 2; f/u US in 2019 was unchanged she is euthyroid off any rx). Past Medical History:  Diagnosis Date  . Acute congestive heart failure ([place])   . Acute diastolic CHF (congestive heart failure) ([place]) 09/14/2016  . Atrophic vaginitis 06/07/2012  . Avitaminosis D 07/28/2012  . CAD (coronary artery disease)    a. NSTEM 09/2016: cath showing severe diffuse disease of the RCA, ramus and Cx with mild-mod disease of LAD; PCI would require multiple stents and significant contrast usage thus medical therapy recommended.  . Cellulitis 07/12/2015  . Chest pain 01/01/2017  . Chronic diastolic CHF (congestive heart failure) ([place])   . Chronic kidney disease 09/23/2016  . CKD (chronic kidney disease), stage IV ([place])   . COPD GOLD  0 08/31/2017   Quit smoking 07/17/17 - Spirometry 09/01/2017  FEV1 1.23 (58%)  Ratio 57 with mild curvature p no rx - 09/01/2017  After extensive coaching inhaler device,  effectiveness =    75% with elipta with cough provoked  - PFT's  10/14/2017  FEV1 1.69 (68 % ) ratio 84  p no % improvement from saba p nothing prior to study with DLCO  50 % corrects to 76  % for alv volume   - 10/14/2017  Walked RA x 3 laps @ 1  . Current tobacco use 06/10/2012  . Diabetes mellitus with neuropathy ([place])   . Diabetic foot ulcer ([place]) 07/13/2015  . Diabetic polyneuropathy associated with type 2 diabetes mellitus ([place]) 08/19/2017    . DOE (dyspnea on exertion) 09/01/2017   09/01/2017  Walked RA x 3 laps @ 185 ft each stopped due to  End of study, nl to mod fast  pace, no  desat   But stopped to rest x 2  Spirometry 09/01/2017  FEV1 1.23 (58%)  Ratio 57  > trial of anoro    . Essential (primary) hypertension 06/10/2012  . Foot amputation status    a. h/o left foot transmetatarsal amputation  . High cholesterol   . History of biliary T-tube placement 06/10/2012  . History of cardiac arrest   . HLD (hyperlipidemia) 09/02/2012   Overview:  ICD-10 cut over    . Hypertension    a. patent renal arteries by PV angio 08/2015. b. heavy proteinuria 09/2016 ? nephrotic.  [name] Hypertensive heart disease with heart failure ([place])   . Hypertriglyceridemia 07/28/2012  . Leg pain 06/10/2012  . Normocytic anemia   . NSTEMI (non-ST elevated myocardial infarction) ([place]) 01/01/2017  . Obesity   . Onychomycosis 12/24/2015  . Peripheral nerve disease 09/02/2012  . Peripheral vascular disease ([place]) 07/28/2012  . Proteinuria   . PVD (peripheral vascular disease) ([place])    a. status post left SFA and popliteal stent and left SFA PTA with drug coated balloon in 08/2015.  . Status post transmetatarsal amputation of foot, left ([place]) 09/06/2015  . Tobacco abuse   .  Type 2 diabetes mellitus ([place]) 06/07/2012  . Type 2 diabetes mellitus with peripheral angiopathy ([place]) 09/02/2012    Past Surgical History:  Procedure Laterality Date  . ABDOMINAL HYSTERECTOMY    . AMPUTATION Left 09/06/2015   Procedure: LEFT TRANSMETATARSAL AMPUTATTION;  Surgeon: Newt Minion, MD;  Location: [facility];  Service: Orthopedics;  Laterality: Left;  . LEFT HEART CATH AND CORONARY ANGIOGRAPHY N/A 09/17/2016   Procedure: LEFT HEART CATH AND CORONARY ANGIOGRAPHY;  Surgeon: Jettie Booze, MD;  Location: [facility];  Service: Cardiovascular;  Laterality: N/A;  . PERIPHERAL VASCULAR CATHETERIZATION N/A 08/16/2015   Procedure: Abdominal Aortogram;  Surgeon: Elam Dutch, MD;   Location: [facility];  Service: Cardiovascular;  Laterality: N/A;  . PERIPHERAL VASCULAR CATHETERIZATION Bilateral 08/16/2015   Procedure: Lower Extremity Angiography;  Surgeon: Elam Dutch, MD;  Location: [facility];  Service: Cardiovascular;  Laterality: Bilateral;  . PERIPHERAL VASCULAR CATHETERIZATION Left 08/16/2015   Procedure: Peripheral Vascular Intervention;  Surgeon: Elam Dutch, MD;  Location: [facility];  Service: Cardiovascular;  Laterality: Left;  SFA STENT X 2    Social History   Socioeconomic History  . Marital status: Married    Spouse name: Not on file  . Number of children: Not on file  . Years of education: Not on file  . Highest education level: Not on file  Occupational History  . Not on file  Social Needs  . Financial resource strain: Not on file  . Food insecurity:    Worry: Not on file    Inability: Not on file  . Transportation needs:    Medical: Not on file    Non-medical: Not on file  Tobacco Use  . Smoking status: Former Smoker    Packs/day: 1.00    Years: 42.00    Pack years: 42.00    Types: Cigarettes    Last attempt to quit: 07/17/2017    Years since quitting: 0.6  . Smokeless tobacco: Never Used  Substance and Sexual Activity  . Alcohol use: Yes    Alcohol/week: 1.0 - 2.0 standard drinks    Types: 1 - 2 Glasses of wine per week    Comment: on social occassions  . Drug use: No  . Sexual activity: Not on file  Lifestyle  . Physical activity:    Days per week: Not on file    Minutes per session: Not on file  . Stress: Not on file  Relationships  . Social connections:    Talks on phone: Not on file    Gets together: Not on file    Attends religious service: Not on file    Active member of club or organization: Not on file    Attends meetings of clubs or organizations: Not on file    Relationship status: Not on file  . Intimate partner violence:    Fear of current or ex partner: Not on file     Emotionally abused: Not on file    Physically abused: Not on file    Forced sexual activity: Not on file  Other Topics Concern  . Not on file  Social History Narrative  . Not on file    Current Outpatient Medications on File Prior to Visit  Medication Sig Dispense Refill  . amLODipine (NORVASC) 10 MG tablet Take 1 tablet (10 mg total) by mouth daily. Reported on 07/13/2015 90 tablet 2  . Ascorbic Acid (VITAMIN C) 1000 MG tablet Take  1,000 mg by mouth daily.    [name] aspirin 81 MG EC tablet Take 1 tablet (81 mg total) by mouth daily. Reported on 07/13/2015 90 tablet 0  . atorvastatin (LIPITOR) 80 MG tablet Take 1 tablet (80 mg total) by mouth daily. 90 tablet 2  . Blood Glucose Monitoring Suppl (ACCU-CHEK AVIVA) device Use as instructed three times daily. 1 each 0  . carvedilol (COREG) 25 MG tablet Take 1 tablet (25 mg total) by mouth 2 (two) times daily. 180 tablet 2  . Cholecalciferol (VITAMIN D-3) 1000 units CAPS Take 1,000 Units by mouth daily.     . clopidogrel (PLAVIX) 75 MG tablet Take 1 tablet (75 mg total) by mouth daily. 90 tablet 1  . docusate sodium (COLACE) 100 MG capsule Take 100 mg by mouth 2 (two) times daily.    . furosemide (LASIX) 40 MG tablet Take 2 tabs (80 mg total) by mouth at 8 am, then take 1 tab (40 mg total) by mouth at 2 pm everyday. 90 tablet 1  . gabapentin (NEURONTIN) 300 MG capsule Take 1 capsule (300 mg total) by mouth 3 (three) times daily. 90 capsule 3  . glucose blood (ACCU-CHEK AVIVA) test strip Use as instructed three times daily before meals. 100 each 12  . hydrALAZINE (APRESOLINE) 100 MG tablet Take 1 tablet (100 mg total) by mouth 3 (three) times daily. 90 tablet 1  . isosorbide dinitrate (ISORDIL) 30 MG tablet Take 60 mg by mouth 2 (two) times daily.    Elmore Guise Devices (ACCU-CHEK SOFTCLIX) lancets Use as instructed three times daily before meals. 1 each 5  . latanoprost (XALATAN) 0.005 % ophthalmic solution 1 drop at bedtime.    . Omega-3 Fatty Acids (FISH  OIL) 1000 MG CAPS Take 2,000 mg by mouth daily. Reported on 07/13/2015    . ranolazine (RANEXA) 500 MG 12 hr tablet Take 1 tablet (500 mg total) by mouth 2 (two) times daily. 180 tablet 3  . sitaGLIPtin (JANUVIA) 25 MG tablet Take 1 tablet (25 mg total) by mouth daily. 30 tablet 3  . nitroGLYCERIN (NITROSTAT) 0.4 MG SL tablet Place 1 tablet (0.4 mg total) under the tongue every 5 (five) minutes as needed for chest pain. 25 tablet 3   No current facility-administered medications on file prior to visit.     No Known Allergies  Family History  Problem Relation Age of Onset  . Diabetes Mother   . Hypertension Father     BP 120/60 (BP Location: Left Arm, Patient Position: Sitting, Cuff Size: Large)   Pulse 66   Ht 5\' 4"  (1.626 m)   Wt 200 lb 3.2 oz (90.8 kg)   SpO2 95%   BMI 34.36 kg/m   Review of Systems She denies hypoglycemia.    Objective:   Physical Exam VITAL SIGNS:  See vs page GENERAL: no distress Pulses: dorsalis pedis intact bilat.   MSK: left transmetatarsal amputation CV: no leg edema Skin:  no ulcer on the feet.  normal color and temp on the feet. Neuro: sensation is intact to touch on the feet Ext: There is bilateral onychomycosis of the right foot toenails.    Lab Results  Component Value Date   CREATININE 3.04 (H) 10/07/2017   BUN 33 (H) 10/07/2017   NA 141 10/07/2017   K 3.9 10/07/2017   CL 102 10/07/2017   CO2 23 10/07/2017    Lab Results  Component Value Date   HGBA1C 6.9 (A) 03/03/2018  Assessment & Plan:  Type 2 DM: overcontrolled, for this SU-containing regimen renal failure: this limits rx options and dosages.     Patient Instructions  check your blood sugar once a day.  vary the time of day when you check, between before the 3 meals, and at bedtime.  also check if you have symptoms of your blood sugar being too high or too low.  please keep a record of the readings and bring it to your next appointment here (or you can bring the meter  itself).  You can write it on any piece of paper.  please call us sooner if your blood sugar goes below 70, or if you have a lot of readings over 200.    I have sent a prescription to your pharmacy, to reduce the glipizide to breakfast only.  We will need to take this complex situation in stages.   Our goals are an a1c of aprox 7.0%, and being off the glipizide. Please come back for a follow-up appointment in 1 month.

## 2018-03-03 NOTE — Patient Instructions (Addendum)
check your blood sugar once a day.  vary the time of day when you check, between before the 3 meals, and at bedtime.  also check if you have symptoms of your blood sugar being too high or too low.  please keep a record of the readings and bring it to your next appointment here (or you can bring the meter itself).  You can write it on any piece of paper.  please call us sooner if your blood sugar goes below 70, or if you have a lot of readings over 200.    I have sent a prescription to your pharmacy, to reduce the glipizide to breakfast only.  We will need to take this complex situation in stages.   Our goals are an a1c of aprox 7.0%, and being off the glipizide. Please come back for a follow-up appointment in 1 month.

## 2018-03-23 ENCOUNTER — Telehealth: Payer: Self-pay | Admitting: *Deleted

## 2018-03-23 NOTE — Telephone Encounter (Signed)
For patients that have been cancelled:   You will be contacted at a later time to reschedule. However, this will depend on ongoing evaluation of the Covid-19 situation.  Please call us if any new questions or concerns arise. Despite this difficult time, we are right here with you.  Routing to C19 CANCEL pool.    _____________  Cardiac Questionnaire:    Since your last visit or hospitalization:    1. Have you been having chest pain? NO   2. Have you been having shortness of breath? NO   3. Have you been having increasing edema, wt gain, or increase in abdominal girth (pants fitting more tightly)? NO   4. Have you had any passing out spells? NO      Appointment Cancelled due to Coronavirus:  Called patient in regards to her f/u appointment with Dr. [name] on 03/28/18.   Patient denies having any chest pain, SOB, cough, fever, or any other Sx.   Patient was okay with cancelling appointment and has been made aware that they will be contacted in the near future to reschedule.   Refills have been sent in if needed.   Patient understands to let us know if they develop any Sx before then.

## 2018-03-28 ENCOUNTER — Ambulatory Visit: Payer: Medicaid Other | Admitting: Cardiology

## 2018-04-07 ENCOUNTER — Ambulatory Visit: Payer: Medicare HMO | Admitting: Endocrinology

## 2018-04-11 ENCOUNTER — Telehealth: Payer: Self-pay | Admitting: *Deleted

## 2018-04-11 ENCOUNTER — Telehealth: Payer: Self-pay | Admitting: Cardiology

## 2018-04-11 NOTE — Telephone Encounter (Signed)
Spoke with patient via [place].  Confirmed all demographics and e-mail. Signed her up for MyChart, which is now active.

## 2018-04-11 NOTE — Telephone Encounter (Signed)
Virtual Visit Pre-Appointment Phone Call  Steps For Call:  1. Confirm consent - "In the setting of the current Covid19 crisis, you are scheduled for a (phone or video) visit with your provider on (date) at (time).  Just as we do with many in-office visits, in order for you to participate in this visit, we must obtain consent.  If you'd like, I can send this to your mychart (if signed up) or email for you to review.  Otherwise, I can obtain your verbal consent now.  All virtual visits are billed to your insurance company just like a normal visit would be.  By agreeing to a virtual visit, we'd like you to understand that the technology does not allow for your provider to perform an examination, and thus may limit your provider's ability to fully assess your condition.  Finally, though the technology is pretty good, we cannot assure that it will always work on either your or our end, and in the setting of a video visit, we may have to convert it to a phone-only visit.  In either situation, we cannot ensure that we have a secure connection.  Are you willing to proceed?"  2. Give patient instructions for WebEx download to smartphone as below if video visit  3. Advise patient to be prepared with any vital sign or heart rhythm information, their current medicines, and a piece of paper and pen handy for any instructions they may receive the day of their visit  4. Inform patient they will receive a phone call 15 minutes prior to their appointment time (may be from unknown caller ID) so they should be prepared to answer  5. Confirm that appointment type is correct in Epic appointment notes (video vs telephone)    TELEPHONE CALL NOTE  Mel Tadros has been deemed a candidate for a follow-up tele-health visit to limit community exposure during the Covid-19 pandemic. I spoke with the patient via phone to ensure availability of phone/video source, confirm preferred email & phone number, and discuss  instructions and expectations.  I reminded Rebekah Johnson to be prepared with any vital sign and/or heart rhythm information that could potentially be obtained via home monitoring, at the time of her visit. I reminded Rebekah Johnson to expect a phone call at the time of her visit if her visit.  Did the patient verbally acknowledge consent to treatment? 04/12/2018  Jeanann Lewandowsky, [place] 04/11/2018 2:07 PM   DOWNLOADING THE [place], go to [employer] and type in WebEx in the search bar. [place] [employer], the blue/green circle. The app is free but as with any other app downloads, their phone may require them to verify saved payment information or Apple password. The patient does NOT have to create an account.  - If Android, ask patient to go to [employer] and type in WebEx in the search bar. [place] [employer], the blue/green circle. The app is free but as with any other app downloads, their phone may require them to verify saved payment information or Android password. The patient does NOT have to create an account.   CONSENT FOR TELE-HEALTH VISIT - PLEASE REVIEW  I hereby voluntarily request, consent and authorize [facility] and its employed or contracted physicians, physician assistants, nurse practitioners or other licensed health care professionals (the Practitioner), to provide me with telemedicine health care services (the "Services") as deemed necessary by the treating Practitioner. I acknowledge and consent  to receive the Services by the Practitioner via telemedicine. I understand that the telemedicine visit will involve communicating with the Practitioner through live audiovisual communication technology and the disclosure of certain medical information by electronic transmission. I acknowledge that I have been given the opportunity to request an in-person assessment or other available alternative prior to the telemedicine visit  and am voluntarily participating in the telemedicine visit.  I understand that I have the right to withhold or withdraw my consent to the use of telemedicine in the course of my care at any time, without affecting my right to future care or treatment, and that the Practitioner or I may terminate the telemedicine visit at any time. I understand that I have the right to inspect all information obtained and/or recorded in the course of the telemedicine visit and may receive copies of available information for a reasonable fee.  I understand that some of the potential risks of receiving the Services via telemedicine include:  [name] Delay or interruption in medical evaluation due to technological equipment failure or disruption; . Information transmitted may not be sufficient (e.g. poor resolution of images) to allow for appropriate medical decision making by the Practitioner; and/or  . In rare instances, security protocols could fail, causing a breach of personal health information.  Furthermore, I acknowledge that it is my responsibility to provide information about my medical history, conditions and care that is complete and accurate to the best of my ability. I acknowledge that Practitioner's advice, recommendations, and/or decision may be based on factors not within their control, such as incomplete or inaccurate data provided by me or distortions of diagnostic images or specimens that may result from electronic transmissions. I understand that the practice of medicine is not an exact science and that Practitioner makes no warranties or guarantees regarding treatment outcomes. I acknowledge that I will receive a copy of this consent concurrently upon execution via email to the email address I last provided but may also request a printed copy by calling the office of [facility].    I understand that my insurance will be billed for this visit.   I have read or had this consent read to me. . I understand  the contents of this consent, which adequately explains the benefits and risks of the Services being provided via telemedicine.  . I have been provided ample opportunity to ask questions regarding this consent and the Services and have had my questions answered to my satisfaction. . I give my informed consent for the services to be provided through the use of telemedicine in my medical care  By participating in this telemedicine visit I agree to the above.

## 2018-04-11 NOTE — Progress Notes (Signed)
Virtual Visit via Video Note   This visit type was conducted due to national recommendations for restrictions regarding the COVID-19 Pandemic (e.g. social distancing) in an effort to limit this patient's exposure and mitigate transmission in our community.  Due to her co-morbid illnesses, this patient is at least at moderate risk for complications without adequate follow up.  This format is felt to be most appropriate for this patient at this time.  All issues noted in this document were discussed and addressed.  A limited physical exam was performed with this format.  Please refer to the patient's chart for her consent to telehealth for [facility].   Evaluation Performed:  Follow-up visit  Date:  04/12/2018   ID:  Rebekah Johnson, DOB Aug 06, 1960, MRN [id number]  Patient Location: Home  Provider Location: Home  PCP:  System, Pcp Not In  Cardiologist:  Ena Dawley, MD   Chief Complaint:  Follow up   History of Present Illness:    Rebekah Johnson is a 58 y.o. female who presents via audio/video conferencing for a telehealth visit today.    Rebekah Johnson has a prior hx of CAD status post NSTEMI 09/2016 with severe diffuse disease of the RCA, ramus and circumflex with mild to moderate disease of the LAD. Medical therapy was recommended because multiple stents and significant and contrast would have to be used.Also has chronic diastolic CHF, CKD stage III, DM with neuropathy and foot amputation, hypertension, HLD, PVD status post left SFA and popliteal stent and left SFA PTA with drug-coated balloon 08/2015, tobacco abuse.  It was noted that she was readmitted with a non-STEMI and acute >chronic CHF exacerbation 12/2016 in which she was managed medically.  [facility] [place] secondary to pneumonia. She was treated with IV Rocephin and azithromycin. BNP was elevated at 648 and  she was given IV Lasix.She was seen by cardiology because of elevated troponin at 4.37. She was felt to have an NSTEMI secondary to demand ischemia. She also had a hypertensive emergency. She was given a Plavix load and managed medically due to renal function. Metoprolol was stopped and Coreg started along with Norvasc. Creatinine was 3.0 at discharge. Urine drug screen was positive for marijuana.  She was then seen in follow-up with our service 08/31/2017 in which she was still having chest pain. Ranexa 500 mg twice daily was added to her regimen. She was also referred to an endocrinologist and pulmonary medicine. Echocardiogram from 09/02/2017 showed normal LV systolic function at 50 to 55% with severe hypokinesis and scarring of the basal mid inferior lateral myocardium with a grade 2 DD and moderate MR. She saw Dr. [name] 3 days later and was up 7 pounds. Her Lasix was increased to 80 mg in the morning and 40 mg in the evening as well as spironolactone 25 mg once daily was added.  Patient was in [place] [facility] again 09/07/2017 with pulmonary edema/repiratory failure on ventilator for 4 days. She endured a cardiac arrest with asystole in the ER with successful resuscitation and hypothermia protocol.Echocardiogram during that time showed borderline reduced LV function with an EF of 49% with severely hypokinetic to akinetic inferior and lateral wall with mitral regurgitation. Per chart review, it was noted that she would need a cardiac catheterization once stabilized however, the patient was adamant about being treated at [facility].   During her last office visit on 11/23/2017, significant events include being diagnosed with a thyroid goiter  in which she is followed by Dr. Loanne Drilling. She wore a 30-day monitor which showed normal sinus rhythm with no pauses or arrhythmias. 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 Kidney biopsy showed scarring with no reversibility with a GFR of 20.  At that time,  they were considering kidney transplant. She had no complaints of chest pain, palpitations dizziness or presyncope. She had gone to a class to learn more about peritoneal dialysis. Last weight on 11/23/2017 208lb, prior to that on 11/17/2017 she was 199lb.   Today, she reports that she has been doing very well.  She denies chest pain, palpitations, shortness of breath, PND, orthopnea, dizziness, presyncopal or syncopal episodes.  She states that she is currently being worked up for a kidney transplant.  She states that she follows with Dr. Lyda Kalata and will likely need to undergo peritoneal dialysis while waiting for her kidney.  She was taking educational classes for this however, they have been postponed due to COVID-19 until further notice.  Her BP is elevated today however, in discussion she states that she has not taken her a.m. medications and therefore I would anticipate this would come down significantly.  There were several telephone encounters regarding the price of Ranexa however she states that she is able to now afford it and has been taking this without problems.  Her weight is stable at 194lb today and states she will fluctuate only mildly with 1 pound here and there.  She states she is on Lasix 80 mg twice daily.  She has no other complaints.  We discussed having close follow-up given her upcoming transplant.  She is willing for face-to-face or virtual visit with Dr. [name].  The patient does not have symptoms concerning for COVID-19 infection (fever, chills, cough, or new shortness of breath).   Past Medical History:  Diagnosis Date  . Acute congestive heart failure ([place])   . Acute diastolic CHF (congestive heart failure) ([place]) 09/14/2016  . Atrophic vaginitis 06/07/2012  . Avitaminosis D 07/28/2012  . CAD (coronary artery disease)    a. NSTEM 09/2016: cath showing severe diffuse disease of the RCA, ramus and Cx with mild-mod disease of LAD; PCI would require multiple stents and significant  contrast usage thus medical therapy recommended.  . Cellulitis 07/12/2015  . Chest pain 01/01/2017  . Chronic diastolic CHF (congestive heart failure) ([place])   . Chronic kidney disease 09/23/2016  . CKD (chronic kidney disease), stage IV ([place])   . COPD GOLD  0 08/31/2017   Quit smoking 07/17/17 - Spirometry 09/01/2017  FEV1 1.23 (58%)  Ratio 57 with mild curvature p no rx - 09/01/2017  After extensive coaching inhaler device,  effectiveness =    75% with elipta with cough provoked  - PFT's  10/14/2017  FEV1 1.69 (68 % ) ratio 84  p no % improvement from saba p nothing prior to study with DLCO  50 % corrects to 76  % for alv volume   - 10/14/2017  Walked RA x 3 laps @ 1  . Current tobacco use 06/10/2012  . Diabetes mellitus with neuropathy ([place])   . Diabetic foot ulcer ([place]) 07/13/2015  . Diabetic polyneuropathy associated with type 2 diabetes mellitus ([place]) 08/19/2017  . DOE (dyspnea on exertion) 09/01/2017   09/01/2017  Walked RA x 3 laps @ 185 ft each stopped due to  End of study, nl to mod fast  pace, no  desat   But stopped to rest x 2  Spirometry 09/01/2017  FEV1 1.23 (58%)  Ratio 57  > trial of anoro    . Essential (primary) hypertension 06/10/2012  . Foot amputation status    a. h/o left foot transmetatarsal amputation  . High cholesterol   . History of biliary T-tube placement 06/10/2012  . History of cardiac arrest   . HLD (hyperlipidemia) 09/02/2012   Overview:  ICD-10 cut over    . Hypertension    a. patent renal arteries by PV angio 08/2015. b. heavy proteinuria 09/2016 ? nephrotic.  [name] Hypertensive heart disease with heart failure ([place])   . Hypertriglyceridemia 07/28/2012  . Leg pain 06/10/2012  . Normocytic anemia   . NSTEMI (non-ST elevated myocardial infarction) ([place]) 01/01/2017  . Obesity   . Onychomycosis 12/24/2015  . Peripheral nerve disease 09/02/2012  . Peripheral vascular disease ([place]) 07/28/2012  . Proteinuria   . PVD (peripheral vascular disease) ([place])    a. status post left SFA and  popliteal stent and left SFA PTA with drug coated balloon in 08/2015.  . Status post transmetatarsal amputation of foot, left ([place]) 09/06/2015  . Tobacco abuse   . Type 2 diabetes mellitus ([place]) 06/07/2012  . Type 2 diabetes mellitus with peripheral angiopathy ([place]) 09/02/2012   Past Surgical History:  Procedure Laterality Date  . ABDOMINAL HYSTERECTOMY    . AMPUTATION Left 09/06/2015   Procedure: LEFT TRANSMETATARSAL AMPUTATTION;  Surgeon: Newt Minion, MD;  Location: [facility];  Service: Orthopedics;  Laterality: Left;  . LEFT HEART CATH AND CORONARY ANGIOGRAPHY N/A 09/17/2016   Procedure: LEFT HEART CATH AND CORONARY ANGIOGRAPHY;  Surgeon: Jettie Booze, MD;  Location: [facility];  Service: Cardiovascular;  Laterality: N/A;  . PERIPHERAL VASCULAR CATHETERIZATION N/A 08/16/2015   Procedure: Abdominal Aortogram;  Surgeon: Elam Dutch, MD;  Location: [facility];  Service: Cardiovascular;  Laterality: N/A;  . PERIPHERAL VASCULAR CATHETERIZATION Bilateral 08/16/2015   Procedure: Lower Extremity Angiography;  Surgeon: Elam Dutch, MD;  Location: [facility];  Service: Cardiovascular;  Laterality: Bilateral;  . PERIPHERAL VASCULAR CATHETERIZATION Left 08/16/2015   Procedure: Peripheral Vascular Intervention;  Surgeon: Elam Dutch, MD;  Location: [facility];  Service: Cardiovascular;  Laterality: Left;  SFA STENT X 2     Current Meds  Medication Sig  . amLODipine (NORVASC) 10 MG tablet Take 1 tablet (10 mg total) by mouth daily. Reported on 07/13/2015  . Ascorbic Acid (VITAMIN C) 1000 MG tablet Take 1,000 mg by mouth daily.  [name] aspirin 81 MG EC tablet Take 1 tablet (81 mg total) by mouth daily. Reported on 07/13/2015  . atorvastatin (LIPITOR) 80 MG tablet Take 1 tablet (80 mg total) by mouth daily.  . Blood Glucose Monitoring Suppl (ACCU-CHEK AVIVA) device Use as instructed three times daily.  . carvedilol (COREG) 25 MG tablet Take 1 tablet (25 mg total) by  mouth 2 (two) times daily.  . Cholecalciferol (VITAMIN D-3) 1000 units CAPS Take 1,000 Units by mouth daily.   . clopidogrel (PLAVIX) 75 MG tablet Take 1 tablet (75 mg total) by mouth daily.  [name] docusate sodium (COLACE) 100 MG capsule Take 200 mg by mouth 2 (two) times daily.   . furosemide (LASIX) 80 MG tablet Take 80 mg by mouth 2 (two) times daily.  [name] gabapentin (NEURONTIN) 300 MG capsule Take 1 capsule (300 mg total) by mouth 3 (three) times daily.  [name] glipiZIDE (GLUCOTROL) 10 MG tablet Take 1 tablet (10 mg total) by mouth daily before breakfast.  .  glucose blood (ACCU-CHEK AVIVA) test strip Use as instructed three times daily before meals.  . hydrALAZINE (APRESOLINE) 100 MG tablet Take 1 tablet (100 mg total) by mouth 3 (three) times daily.  . isosorbide mononitrate (IMDUR) 30 MG 24 hr tablet Take 60 mg by mouth daily.  Elmore Guise Devices (ACCU-CHEK SOFTCLIX) lancets Use as instructed three times daily before meals.  . latanoprost (XALATAN) 0.005 % ophthalmic solution 1 drop at bedtime.  . Omega-3 Fatty Acids (FISH OIL) 1000 MG CAPS Take 2,000 mg by mouth daily. Reported on 07/13/2015  . ranolazine (RANEXA) 500 MG 12 hr tablet Take 1 tablet (500 mg total) by mouth 2 (two) times daily.  . sitaGLIPtin (JANUVIA) 25 MG tablet Take 1 tablet (25 mg total) by mouth daily.     Allergies:   Patient has no known allergies.   Social History   Tobacco Use  . Smoking status: Former Smoker    Packs/day: 1.00    Years: 42.00    Pack years: 42.00    Types: Cigarettes    Last attempt to quit: 07/17/2017    Years since quitting: 0.7  . Smokeless tobacco: Never Used  Substance Use Topics  . Alcohol use: Yes    Alcohol/week: 1.0 - 2.0 standard drinks    Types: 1 - 2 Glasses of wine per week    Comment: on social occassions  . Drug use: No     Family Hx: The patient's family history includes Diabetes in her mother; Hypertension in her father.  ROS:   Please see the history of present illness.      All other systems reviewed and are negative.  Prior CV studies:   The following studies were reviewed today:  2D echo [facility] 09/08/2017 An echocardiogram9/4/19showed borderline reduced left ventricular systolic function with EF of 49% and severely hypokinetic to akinetic inferior and lateral wall of the left ventricle with mitral regurgitation.  2D echo 09/02/2017 Study Conclusions  - Left ventricle: The cavity size was normal. There was mild concentric hypertrophy. Systolic function was at the lower limits of normal. The estimated ejection fraction was in the range of 50% to 55%. Severe hypokinesis and scarring of the basal-midinferolateral myocardium; consistent with infarction in the distribution of the left circumflex coronary artery. Features are consistent with a pseudonormal left ventricular filling pattern, with concomitant abnormal relaxation and increased filling pressure (grade 2 diastolic dysfunction). - Mitral valve: There was moderate regurgitation directed eccentrically and posteriorly. - Left atrium: The atrium was mildly to moderately dilated.  Impressions:  - Compared to September 2018, the MR appears more significant.  30-day monitor 11/11/2017 normal with no arrhythmias  Cardiac catheterization 09/17/2016:   Ost 1st Diag lesion, 25 %stenosed.  Ost 2nd Diag lesion, 25 %stenosed.  [place]amus-1 lesion, 50 %stenosed.  Ramus-2 lesion, 80 %stenosed.  Prox Cx to Mid Cx lesion, 100 %stenosed.  Prox RCA to Mid RCA lesion, 80 %stenosed.  RPDA lesion, 80 %stenosed.  Dist RCA lesion, 80 %stenosed.  Mid RCA to Dist RCA lesion, 80 %stenosed.  LV end diastolic pressure is mildly elevated.  There is no aortic valve stenosis.   Severe diffuse disease of the RCA, Ramus and circumflex.  LAD with only mild to moderate diffuse disease.  PCI would require multiple stents and significant contrast usage.  For now, would treat medically.      Labs/Other Tests and Data Reviewed:    Recent Labs: 05/28/2017: NT-Pro BNP 1,155 06/04/2017: B Natriuretic Peptide 273.1 07/20/2017: Hemoglobin 10.8;  Platelet Count 229 10/07/2017: ALT 14; BUN 33; Creatinine, Ser 3.04; Potassium 3.9; Sodium 141; TSH 0.853   Recent Lipid Panel Lab Results  Component Value Date/Time   CHOL 119 08/06/2017 09:20 AM   TRIG 307 (H) 08/06/2017 09:20 AM   HDL 30 (L) 08/06/2017 09:20 AM   CHOLHDL 4.0 08/06/2017 09:20 AM   CHOLHDL 3.7 09/16/2016 05:59 AM   LDLCALC 28 08/06/2017 09:20 AM    Wt Readings from Last 3 Encounters:  04/12/18 194 lb 12.8 oz (88.4 kg)  03/03/18 200 lb 3.2 oz (90.8 kg)  02/17/18 202 lb (91.6 kg)     Objective:    Vital Signs:  BP (!) 174/94   Pulse 68   Ht 5\' 4"  (1.626 m)   Wt 194 lb 12.8 oz (88.4 kg)   SpO2 98%   BMI 33.44 kg/m    Well nourished, well developed female in no acute distress.  ASSESSMENT & PLAN:    1.  CAD s/p NSTEM 09/2016 with history of cardiac arrest 2019: -NSTEMI in 2018 with severe diffuse disease in the RCA, ramus and circumflex with mild to moderate disease in the LAD.  Non-STEMI 08/23/2017 at [facility] in [place] in the setting of severe respiratory distress, pneumonia and sepsis.  No catheter was performed at that time in the setting of acute kidney injury with creatinine greater than 3.0. -Patient was last hospitalized at [facility] in [place] 09/07/2017 in the setting of pulmonary edema, respiratory failure and questionable pneumonia with septic shock and was placed on a ventilator for approximately 4 days.  Echocardiogram at that time showed an LVEF of 49% with severe hypokinesis to akinesis of the inferior and lateral walls, not significantly different from her last echocardiogram 08/2017. -During that time, there was mention of a cardiac catheterization to be performed however the patient was adamant about being treated at [facility] per last  office visit was to continue medical therapy given no recent chest pain and follow-up closely  -Denies chest pain or other anginal symptoms, patient is doing very well at this time from a cardiac perspective -Will defer final cath recommendations to primary cardiologist however in the setting of chronic renal failure would anticipate no further ischemic work-up to prevent further kidney damage at this time especially in the setting of no anginal symptoms. -Continue ASA 81, atorvastatin 80 mg, carvedilol 25 mg twice daily, Plavix 75 mg, Imdur 60 mg daily, Ranexa 500 mg twice daily  3.  Essential hypertension: -Elevated, 174/94 however patient states she has not taken her a.m. medications and I anticipate this is significantly lower at baseline after her medications -Currently on hydralazine 100mg  TID, carvedilol 25 mg twice daily -At last office visit, patient given a prescription for blood pressure cuff and pulse oximetry device -Currently on amlodipine 10 mg daily, hydralazine 100 mg 3 times daily, carvedilol 25 mg twice daily  4.  CKD stage IV: -Followed by [facility] and being considered for kidney transplant -Per chart review, on 04/04/2018 transplant classes were suspended  due to COVID 19 -Last creatinine, 3.04 on 10/07/2017>>now up to 4.2 on 03/03/2018  5.  Mixed hyperlipidemia: -On Lipitor, LDL 28 on 08/2017  6.  Tobacco abuse: -Looking cessation strongly encouraged -Patient reports quitting cigarette smoking however continues to smoke marijuana  7.  Chronic CHF: -Last echocardiogram 09/02/2017 with stabilized LV function however grade 2 diastolic dysfunction -Weight, 194lb>> weight 02/2018 200lb -Patient reports only slight fluctuations on a day-to-day basis -  Continue Lasix 80 mg twice daily  8.  DM2: -Follows with endocrinology   COVID-19 Education: The signs and symptoms of COVID-19 were discussed with the patient and how to seek care for testing (follow up with PCP or  arrange E-visit). The importance of social distancing was discussed today.  Time:   Today, I have spent 20 minutes with the patient with telehealth technology discussing the above problems.     Medication Adjustments/Labs and Tests Ordered: Current medicines are reviewed at length with the patient today.  Concerns regarding medicines are outlined above.  Tests Ordered: No orders of the defined types were placed in this encounter.  Medication Changes: No orders of the defined types were placed in this encounter.   Disposition:  Follow up With Dr. [name] in 3 months  Signed, Kathyrn Drown, NP  04/12/2018 9:17 AM    [place]

## 2018-04-11 NOTE — Telephone Encounter (Signed)
Pt is scheduled for virtual / video visit, 04/12/2018, with Kathyrn Drown, NP.

## 2018-04-11 NOTE — Telephone Encounter (Signed)
Virtual Visit Pre-Appointment Phone Call  Steps For Call:  1. Confirm consent - "In the setting of the current Covid19 crisis, you are scheduled for a (phone or video) visit with your provider on (date) at (time).  Just as we do with many in-office visits, in order for you to participate in this visit, we must obtain consent.  If you'd like, I can send this to your mychart (if signed up) or email for you to review.  Otherwise, I can obtain your verbal consent now.  All virtual visits are billed to your insurance company just like a normal visit would be.  By agreeing to a virtual visit, we'd like you to understand that the technology does not allow for your provider to perform an examination, and thus may limit your provider's ability to fully assess your condition.  Finally, though the technology is pretty good, we cannot assure that it will always work on either your or our end, and in the setting of a video visit, we may have to convert it to a phone-only visit.  In either situation, we cannot ensure that we have a secure connection.  Are you willing to proceed?"  2. Give patient instructions for WebEx download to smartphone as below if video visit  3. Advise patient to be prepared with any vital sign or heart rhythm information, their current medicines, and a piece of paper and pen handy for any instructions they may receive the day of their visit  4. Inform patient they will receive a phone call 15 minutes prior to their appointment time (may be from unknown caller ID) so they should be prepared to answer  5. Confirm that appointment type is correct in Epic appointment notes (video vs telephone)    TELEPHONE CALL NOTE  Rebekah Johnson has been deemed a candidate for a follow-up tele-health visit to limit community exposure during the Covid-19 pandemic. I spoke with the patient via phone to ensure availability of phone/video source, confirm preferred email & phone number, and discuss  instructions and expectations.  I reminded Rebekah Johnson to be prepared with any vital sign and/or heart rhythm information that could potentially be obtained via home monitoring, at the time of her visit. I reminded Rebekah Johnson to expect a phone call at the time of her visit if her visit.  Did the patient verbally acknowledge consent to treatment? 04/12/2018  Jeanann Lewandowsky, [place] 04/11/2018 2:01 PM   DOWNLOADING THE [place], go to [employer] and type in WebEx in the search bar. [place] [employer], the blue/green circle. The app is free but as with any other app downloads, their phone may require them to verify saved payment information or Apple password. The patient does NOT have to create an account.  - If Android, ask patient to go to [employer] and type in WebEx in the search bar. [place] [employer], the blue/green circle. The app is free but as with any other app downloads, their phone may require them to verify saved payment information or Android password. The patient does NOT have to create an account.   CONSENT FOR TELE-HEALTH VISIT - PLEASE REVIEW  I hereby voluntarily request, consent and authorize [facility] and its employed or contracted physicians, physician assistants, nurse practitioners or other licensed health care professionals (the Practitioner), to provide me with telemedicine health care services (the "Services") as deemed necessary by the treating Practitioner. I acknowledge and consent  to receive the Services by the Practitioner via telemedicine. I understand that the telemedicine visit will involve communicating with the Practitioner through live audiovisual communication technology and the disclosure of certain medical information by electronic transmission. I acknowledge that I have been given the opportunity to request an in-person assessment or other available alternative prior to the telemedicine visit  and am voluntarily participating in the telemedicine visit.  I understand that I have the right to withhold or withdraw my consent to the use of telemedicine in the course of my care at any time, without affecting my right to future care or treatment, and that the Practitioner or I may terminate the telemedicine visit at any time. I understand that I have the right to inspect all information obtained and/or recorded in the course of the telemedicine visit and may receive copies of available information for a reasonable fee.  I understand that some of the potential risks of receiving the Services via telemedicine include:  [name] Delay or interruption in medical evaluation due to technological equipment failure or disruption; . Information transmitted may not be sufficient (e.g. poor resolution of images) to allow for appropriate medical decision making by the Practitioner; and/or  . In rare instances, security protocols could fail, causing a breach of personal health information.  Furthermore, I acknowledge that it is my responsibility to provide information about my medical history, conditions and care that is complete and accurate to the best of my ability. I acknowledge that Practitioner's advice, recommendations, and/or decision may be based on factors not within their control, such as incomplete or inaccurate data provided by me or distortions of diagnostic images or specimens that may result from electronic transmissions. I understand that the practice of medicine is not an exact science and that Practitioner makes no warranties or guarantees regarding treatment outcomes. I acknowledge that I will receive a copy of this consent concurrently upon execution via email to the email address I last provided but may also request a printed copy by calling the office of [facility].    I understand that my insurance will be billed for this visit.   I have read or had this consent read to me. . I understand  the contents of this consent, which adequately explains the benefits and risks of the Services being provided via telemedicine.  . I have been provided ample opportunity to ask questions regarding this consent and the Services and have had my questions answered to my satisfaction. . I give my informed consent for the services to be provided through the use of telemedicine in my medical care  By participating in this telemedicine visit I agree to the above.

## 2018-04-11 NOTE — Telephone Encounter (Signed)
error 

## 2018-04-12 ENCOUNTER — Encounter: Payer: Self-pay | Admitting: Cardiology

## 2018-04-12 ENCOUNTER — Telehealth (INDEPENDENT_AMBULATORY_CARE_PROVIDER_SITE_OTHER): Payer: Medicare Other | Admitting: Cardiology

## 2018-04-12 ENCOUNTER — Other Ambulatory Visit: Payer: Self-pay

## 2018-04-12 VITALS — BP 174/94 | HR 68 | Ht 64.0 in | Wt 194.8 lb

## 2018-04-12 DIAGNOSIS — I469 Cardiac arrest, cause unspecified: Secondary | ICD-10-CM

## 2018-04-12 DIAGNOSIS — I5042 Chronic combined systolic (congestive) and diastolic (congestive) heart failure: Secondary | ICD-10-CM

## 2018-04-12 DIAGNOSIS — E1151 Type 2 diabetes mellitus with diabetic peripheral angiopathy without gangrene: Secondary | ICD-10-CM

## 2018-04-12 DIAGNOSIS — I10 Essential (primary) hypertension: Secondary | ICD-10-CM | POA: Diagnosis not present

## 2018-04-12 DIAGNOSIS — I2510 Atherosclerotic heart disease of native coronary artery without angina pectoris: Secondary | ICD-10-CM

## 2018-04-12 DIAGNOSIS — I214 Non-ST elevation (NSTEMI) myocardial infarction: Secondary | ICD-10-CM

## 2018-04-12 NOTE — Patient Instructions (Signed)
Medication Instructions:  Your physician recommends that you continue on your current medications as directed. Please refer to the Current Medication list given to you today.  If you need a refill on your cardiac medications before your next appointment, please call your pharmacy.   Lab work: None ordered  If you have labs (blood work) drawn today and your tests are completely normal, you will receive your results only by: [name] MyChart Message (if you have MyChart) OR . A paper copy in the mail If you have any lab test that is abnormal or we need to change your treatment, we will call you to review the results.  Testing/Procedures: None ordered   Follow-Up: At [facility], you and your health needs are our priority.  As part of our continuing mission to provide you with exceptional heart care, we have created designated Provider Care Teams.  These Care Teams include your primary Cardiologist (physician) and Advanced Practice Providers (APPs -  Physician Assistants and Nurse Practitioners) who all work together to provide you with the care you need, when you need it. You will need a follow up appointment in 3 months.  Please call our office 2 months in advance to schedule this appointment.  You may see Ena Dawley, MD or one of the following Advanced Practice Providers on your designated Care Team:   [place], PA-C Melina Copa, PA-C . Ermalinda Barrios, PA-C  Any Other Special Instructions Will Be Listed Below (If Applicable).

## 2018-04-14 ENCOUNTER — Telehealth: Payer: Self-pay | Admitting: Oncology

## 2018-04-14 NOTE — Telephone Encounter (Signed)
Called patient per 4/7 sch message - unable to reach patient - left message for patient not to come in on 4/24 - MD will call

## 2018-04-22 ENCOUNTER — Other Ambulatory Visit: Payer: Self-pay

## 2018-04-22 ENCOUNTER — Inpatient Hospital Stay: Payer: Medicare Other | Attending: Oncology

## 2018-04-22 DIAGNOSIS — N289 Disorder of kidney and ureter, unspecified: Secondary | ICD-10-CM | POA: Diagnosis not present

## 2018-04-22 DIAGNOSIS — E119 Type 2 diabetes mellitus without complications: Secondary | ICD-10-CM | POA: Insufficient documentation

## 2018-04-22 DIAGNOSIS — Z79899 Other long term (current) drug therapy: Secondary | ICD-10-CM | POA: Insufficient documentation

## 2018-04-22 DIAGNOSIS — Z7902 Long term (current) use of antithrombotics/antiplatelets: Secondary | ICD-10-CM | POA: Diagnosis not present

## 2018-04-22 DIAGNOSIS — Z7982 Long term (current) use of aspirin: Secondary | ICD-10-CM | POA: Diagnosis not present

## 2018-04-22 DIAGNOSIS — I10 Essential (primary) hypertension: Secondary | ICD-10-CM | POA: Diagnosis not present

## 2018-04-22 DIAGNOSIS — Z7984 Long term (current) use of oral hypoglycemic drugs: Secondary | ICD-10-CM | POA: Diagnosis not present

## 2018-04-22 DIAGNOSIS — D472 Monoclonal gammopathy: Secondary | ICD-10-CM | POA: Diagnosis present

## 2018-04-22 LAB — CMP (CANCER CENTER ONLY)
ALT: 15 U/L (ref 0–44)
AST: 16 U/L (ref 15–41)
Albumin: 3.9 g/dL (ref 3.5–5.0)
Alkaline Phosphatase: 121 U/L (ref 38–126)
Anion gap: 13 (ref 5–15)
BUN: 37 mg/dL — ABNORMAL HIGH (ref 6–20)
CO2: 24 mmol/L (ref 22–32)
Calcium: 9.9 mg/dL (ref 8.9–10.3)
Chloride: 104 mmol/L (ref 98–111)
Creatinine: 3.38 mg/dL (ref 0.44–1.00)
GFR, Est AFR Am: 16 mL/min — ABNORMAL LOW (ref >60)
GFR, Estimated: 14 mL/min — ABNORMAL LOW (ref >60)
Glucose, Bld: 125 mg/dL — ABNORMAL HIGH (ref 70–99)
Potassium: 3.8 mmol/L (ref 3.5–5.1)
Sodium: 141 mmol/L (ref 135–145)
Total Bilirubin: 0.3 mg/dL (ref 0.3–1.2)
Total Protein: 8.2 g/dL — ABNORMAL HIGH (ref 6.5–8.1)

## 2018-04-22 LAB — CBC WITH DIFFERENTIAL (CANCER CENTER ONLY)
Abs Immature Granulocytes: 0.05 10*3/uL (ref 0.00–0.07)
Basophils Absolute: 0.1 10*3/uL (ref 0.0–0.1)
Basophils Relative: 1 %
Eosinophils Absolute: 0.2 10*3/uL (ref 0.0–0.5)
Eosinophils Relative: 2 %
HCT: 42.9 % (ref 36.0–46.0)
Hemoglobin: 13.4 g/dL (ref 12.0–15.0)
Immature Granulocytes: 0 %
Lymphocytes Relative: 19 %
Lymphs Abs: 2.1 10*3/uL (ref 0.7–4.0)
MCH: 27.3 pg (ref 26.0–34.0)
MCHC: 31.2 g/dL (ref 30.0–36.0)
MCV: 87.4 fL (ref 80.0–100.0)
Monocytes Absolute: 0.7 10*3/uL (ref 0.1–1.0)
Monocytes Relative: 6 %
Neutro Abs: 8.1 10*3/uL — ABNORMAL HIGH (ref 1.7–7.7)
Neutrophils Relative %: 72 %
Platelet Count: 239 10*3/uL (ref 150–400)
RBC: 4.91 MIL/uL (ref 3.87–5.11)
RDW: 13.6 % (ref 11.5–15.5)
WBC Count: 11.3 10*3/uL — ABNORMAL HIGH (ref 4.0–10.5)
nRBC: 0 % (ref 0.0–0.2)

## 2018-04-25 LAB — MULTIPLE MYELOMA PANEL, SERUM
Albumin SerPl Elph-Mcnc: 3.5 g/dL (ref 2.9–4.4)
Albumin/Glob SerPl: 1.1 (ref 0.7–1.7)
Alpha 1: 0.2 g/dL (ref 0.0–0.4)
Alpha2 Glob SerPl Elph-Mcnc: 1.1 g/dL — ABNORMAL HIGH (ref 0.4–1.0)
B-Globulin SerPl Elph-Mcnc: 0.9 g/dL (ref 0.7–1.3)
Gamma Glob SerPl Elph-Mcnc: 1.3 g/dL (ref 0.4–1.8)
Globulin, Total: 3.5 g/dL (ref 2.2–3.9)
IgA: 177 mg/dL (ref 87–352)
IgG (Immunoglobin G), Serum: 1406 mg/dL (ref 586–1602)
IgM (Immunoglobulin M), Srm: 67 mg/dL (ref 26–217)
M Protein SerPl Elph-Mcnc: 0.4 g/dL — ABNORMAL HIGH
Total Protein ELP: 7 g/dL (ref 6.0–8.5)

## 2018-04-25 LAB — KAPPA/LAMBDA LIGHT CHAINS
Kappa free light chain: 65.5 mg/L — ABNORMAL HIGH (ref 3.3–19.4)
Kappa, lambda light chain ratio: 1.9 — ABNORMAL HIGH (ref 0.26–1.65)
Lambda free light chains: 34.5 mg/L — ABNORMAL HIGH (ref 5.7–26.3)

## 2018-04-29 ENCOUNTER — Telehealth: Payer: Self-pay | Admitting: Oncology

## 2018-04-29 ENCOUNTER — Inpatient Hospital Stay (HOSPITAL_BASED_OUTPATIENT_CLINIC_OR_DEPARTMENT_OTHER): Payer: Medicare Other | Admitting: Oncology

## 2018-04-29 DIAGNOSIS — N289 Disorder of kidney and ureter, unspecified: Secondary | ICD-10-CM | POA: Diagnosis not present

## 2018-04-29 DIAGNOSIS — D472 Monoclonal gammopathy: Secondary | ICD-10-CM

## 2018-04-29 DIAGNOSIS — D649 Anemia, unspecified: Secondary | ICD-10-CM | POA: Diagnosis not present

## 2018-04-29 NOTE — Progress Notes (Signed)
Hematology and Oncology Follow Up for Telemedicine Visits  Rebekah Johnson [id number] 02/23/1960 58 y.o. 04/29/2018 9:05 AM System, Pcp Not Sheryle Hail, MD   I connected with Ms. Everhart on 04/29/18 at 10:30 AM EDT by telephone visit and verified that I am speaking with the correct person using two identifiers.   I discussed the limitations, risks, security and privacy concerns of performing an evaluation and management service by telemedicine and the availability of in-person appointments. I also discussed with the patient that there may be a patient responsible charge related to this service. The patient expressed understanding and agreed to proceed.  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  She presented with M spike of 0.5 g/dL without symptomatic myeloma.    Current therapy: Active surveillance.  Interim History: Since her last visit, she reports no major changes in her health.  She denies any recent hospitalizations or illnesses.  She denies any bone pain or pathological fractures.  Continues to attend activities of daily living without any issues.  She continues to follow with nephrology regarding her kidney issues.  .    Medications: I have reviewed the patient's current medications.  Current Outpatient Medications  Medication Sig Dispense Refill  . amLODipine (NORVASC) 10 MG tablet Take 1 tablet (10 mg total) by mouth daily. Reported on 07/13/2015 90 tablet 2  . Ascorbic Acid (VITAMIN C) 1000 MG tablet Take 1,000 mg by mouth daily.    [name] aspirin 81 MG EC tablet Take 1 tablet (81 mg total) by mouth daily. Reported on 07/13/2015 90 tablet 0  . atorvastatin (LIPITOR) 80 MG tablet Take 1 tablet (80 mg total) by mouth daily. 90 tablet 2  . Blood Glucose Monitoring Suppl (ACCU-CHEK AVIVA) device Use as  instructed three times daily. 1 each 0  . carvedilol (COREG) 25 MG tablet Take 1 tablet (25 mg total) by mouth 2 (two) times daily. 180 tablet 2  . Cholecalciferol (VITAMIN D-3) 1000 units CAPS Take 1,000 Units by mouth daily.     . clopidogrel (PLAVIX) 75 MG tablet Take 1 tablet (75 mg total) by mouth daily. 90 tablet 1  . docusate sodium (COLACE) 100 MG capsule Take 200 mg by mouth 2 (two) times daily.     . furosemide (LASIX) 80 MG tablet Take 80 mg by mouth 2 (two) times daily.    [name] gabapentin (NEURONTIN) 300 MG capsule Take 1 capsule (300 mg total) by mouth 3 (three) times daily. 90 capsule 3  . glipiZIDE (GLUCOTROL) 10 MG tablet Take 1 tablet (10 mg total) by mouth daily before breakfast. 30 tablet 0  . glucose blood (ACCU-CHEK AVIVA) test strip Use as instructed three times daily before meals. 100 each 12  . hydrALAZINE (APRESOLINE) 100 MG tablet Take 1 tablet (100 mg total) by mouth 3 (three) times daily. 90 tablet 1  . isosorbide mononitrate (IMDUR) 30 MG 24 hr tablet Take 60 mg by mouth daily.    Elmore Guise Devices (ACCU-CHEK SOFTCLIX) lancets Use as instructed three times daily before meals. 1 each 5  . latanoprost (XALATAN) 0.005 % ophthalmic solution 1 drop at bedtime.    . nitroGLYCERIN (NITROSTAT) 0.4 MG SL tablet Place 1 tablet (0.4 mg total) under the tongue every 5 (five) minutes as needed for chest pain. 25 tablet 3  . Omega-3 Fatty Acids (FISH OIL)  1000 MG CAPS Take 2,000 mg by mouth daily. Reported on 07/13/2015    . ranolazine (RANEXA) 500 MG 12 hr tablet Take 1 tablet (500 mg total) by mouth 2 (two) times daily. 180 tablet 3  . sitaGLIPtin (JANUVIA) 25 MG tablet Take 1 tablet (25 mg total) by mouth daily. 30 tablet 3   No current facility-administered medications for this visit.      Allergies: No Known Allergies  Past Medical History, Surgical history, Social history, and Family History were reviewed and updated.     Lab Results: Lab Results  Component Value Date    WBC 11.3 (H) 04/22/2018   HGB 13.4 04/22/2018   HCT 42.9 04/22/2018   MCV 87.4 04/22/2018   PLT 239 04/22/2018     Chemistry      Component Value Date/Time   NA 141 04/22/2018 1102   NA 141 10/07/2017 1234   K 3.8 04/22/2018 1102   CL 104 04/22/2018 1102   CO2 24 04/22/2018 1102   BUN 37 (H) 04/22/2018 1102   BUN 33 (H) 10/07/2017 1234   CREATININE 3.38 (HH) 04/22/2018 1102   CREATININE 1.19 (H) 12/18/2015 1006      Component Value Date/Time   CALCIUM 9.9 04/22/2018 1102   ALKPHOS 121 04/22/2018 1102   AST 16 04/22/2018 1102   ALT 15 04/22/2018 1102   BILITOT 0.3 04/22/2018 1102        Impression and Plan:  58 year old woman with:  1.    MGUS diagnosed in January 2019.  She presented with IgG kappa subtype without any evidence of endorgan damage.    Protein studies obtained on April 22, 2018 were personally reviewed and discussed with the patient today.  Her M spike continues to be stable and actually declined and IgG level remains normal.  These findings continue to suggest MGUS rather than active myeloma.  I recommended continue active surveillance with repeat testing in 1 year.  Risk of progression to active myeloma was discussed and remains very low with 1 %/year.  2.  Renal insufficiency: Not related to plasma cell disorder.  She has longstanding hypertension and diabetes that caused her kidney failure.  She underwent kidney biopsy in February 2019 which confirmed these findings.  3.  Anemia: Hemoglobin is normal at this time.  4.  Follow-up: We will be in 1 year for repeat laboratory testing.   I discussed the assessment and treatment plan with the patient. The patient was provided an opportunity to ask questions and all were answered. The patient agreed with the plan and demonstrated an understanding of the instructions.   The patient was advised to call back or seek an in-person evaluation if the symptoms worsen or if the condition fails to improve as  anticipated.  I provided 20 minutes of non face-to-face telephone visit time during this encounter, and > 50% was spent on reviewing laboratory data, the natural course of this disease as well as risk of progression and management options.  Zola Button, MD 04/29/2018 9:05 AM

## 2018-04-29 NOTE — Telephone Encounter (Signed)
Scheduled appt per 4/24 sch message.  A calendar will be mailed out.

## 2018-05-04 ENCOUNTER — Encounter: Payer: Self-pay | Admitting: Endocrinology

## 2018-05-05 ENCOUNTER — Other Ambulatory Visit: Payer: Self-pay

## 2018-05-05 ENCOUNTER — Ambulatory Visit: Payer: Medicare HMO | Admitting: Dietician

## 2018-05-05 ENCOUNTER — Ambulatory Visit: Payer: Medicare Other | Admitting: Endocrinology

## 2018-05-05 ENCOUNTER — Encounter: Payer: Self-pay | Admitting: Dietician

## 2018-05-05 DIAGNOSIS — N184 Chronic kidney disease, stage 4 (severe): Secondary | ICD-10-CM

## 2018-05-05 DIAGNOSIS — E1140 Type 2 diabetes mellitus with diabetic neuropathy, unspecified: Secondary | ICD-10-CM

## 2018-05-05 DIAGNOSIS — E1100 Type 2 diabetes mellitus with hyperosmolarity without nonketotic hyperglycemic-hyperosmolar coma (NKHHC): Secondary | ICD-10-CM

## 2018-05-05 NOTE — Patient Instructions (Addendum)
Continue to stay active Drink more water (5 cup fluid per day) Avoid Pepsi Consider changing the instant oatmeal to regular oats and add cinnamon, apples or berries to flavor. Meat portion no larger than a deck of cards. Read labels and avoid any seasoning/salt with Potassium Chloride added.  Potassium that is natural in foods is fine,.  As the kidneys worsen, you may need to restrict some kinds of vegetables and portion sizes. Read labels and avoid any item that has added Phos...(phosphorous, phosphate) ingredient.  This is used as a preservative or other and is not good due to kidney disease. Continue to follow a low sodium diet.  If you have internet you can find more information and recipes (choose recipes without dialysis until that changes).  [email or web address]  [email or web address]

## 2018-05-05 NOTE — Progress Notes (Signed)
Medical Nutrition Therapy:  Appt start time: 1030 end time:  1115. This visit was completed via telephone due to the COVID-19 pandemic.   I spoke with Rebekah Johnson and verified that I was speaking with the correct person with two patient identifiers (full name and date of birth).   I discussed the limitations related to this kind of visit and the patient is willing to proceed. I am in my office and she is speaking from her cell phone from the [place] parking lot.  Assessment:  Primary concerns today: Patient would like to learn how to eat with multiple health issues. History includes type 2 diabetes, HTN, stage 4 CKD, CHF, CAD high cholesterol IgGKappa MGUS.  She quit smoking last July (now only smokes marijuana). She is considering home dialysis when needed.  Kidney transplant process on hold due to COVID-19.   She checks her BG but "not regularly" and states that they are always within range (reviewed range definition before meals and after meals).   Labs include:  BUN 37, Creatinine 3.38, Potassium 3.8, GFR 16 04/22/18.   A1C 6.9% 03/03/2018.  Weight 195 lbs which she states is stable.  Medications include glipizide, vitamin D, and Januvia which patient states she is taking.  Patient lives with a room mate Trilby Drummer).  She does her own shopping and cooking.  She is on disability.  Preferred Learning Style:   No preference indicated   Learning Readiness:   Ready  Change in progress  DIETARY INTAKE: No added salt, no canned foods 24-hr recall:  B (8:30-8:30 AM): instant oatmeal (2 packs), 1 slice [employer] toast, dry or occasional jelly, occasional juice or milk  Snk ( AM): none  L ( PM): skips OR [place] neck/neck, brown rice, beans, greens OR chicken and [employer] macaroni and cheddar cheese  Snk ( PM): none D ( PM): similar to lunch Snk ( PM): none Beverages: water, 2% milk (rare), occasional juice ("drinks very little"), rare regular Pepsi 2-3 per week  Usual physical activity:  fishing, washes her car, cleans her own home, walks in the yard  Estimated energy needs: 1800 calories 45-55 g protein  Progress Towards Goal(s):  In progress.   Nutritional Diagnosis:  NB-1.1 Food and nutrition-related knowledge deficit As related to renal diet.  As evidenced by diet hx and patient report.  no previous education.    Intervention:  Nutrition education related to a renal diet.  Reviewed low sodium restriction as she is already working on this. Discussed that she does not ned a potassium restriction at this time as her blood potassium is normal.  Discussed in relationship to type 2 diabetes.  BG goals reviewed.  Discussed benefits of consistent monitoring.    Continue to stay active Drink more water (5 cup fluid per day) Avoid Pepsi Consider changing the instant oatmeal to regular oats and add cinnamon, apples or berries to flavor. Meat portion no larger than a deck of cards. Read labels and avoid any seasoning/salt with Potassium Chloride added.  Potassium that is natural in foods is fine,.  As the kidneys worsen, you may need to restrict some kinds of vegetables and portion sizes. Read labels and avoid any item that has added Phos...(phosphorous, phosphate) ingredient.  This is used as a preservative or other and is not good due to kidney disease. Continue to follow a low sodium diet.  If you have internet you can find more information and recipes (choose recipes without dialysis until that changes).  [email or web address]  [email or web address]  Teaching Method Utilized:  Auditory  Handouts mailed to patient include:  Eating right for CKD from the [facility]  Barriers to learning/adherence to lifestyle change: none  Demonstrated degree of understanding via:  Teach Back   Monitoring/Evaluation:  Dietary intake, exercise, label reading, and body weight in 2 month(s).

## 2018-05-18 ENCOUNTER — Ambulatory Visit (INDEPENDENT_AMBULATORY_CARE_PROVIDER_SITE_OTHER): Payer: Medicare HMO | Admitting: Orthopedic Surgery

## 2018-05-19 ENCOUNTER — Other Ambulatory Visit: Payer: Self-pay

## 2018-05-19 ENCOUNTER — Encounter: Payer: Self-pay | Admitting: Orthopedic Surgery

## 2018-05-19 ENCOUNTER — Ambulatory Visit (INDEPENDENT_AMBULATORY_CARE_PROVIDER_SITE_OTHER): Payer: Medicare Other | Admitting: Orthopedic Surgery

## 2018-05-19 VITALS — Ht 64.0 in | Wt 195.0 lb

## 2018-05-19 DIAGNOSIS — N184 Chronic kidney disease, stage 4 (severe): Secondary | ICD-10-CM

## 2018-05-19 DIAGNOSIS — I5042 Chronic combined systolic (congestive) and diastolic (congestive) heart failure: Secondary | ICD-10-CM

## 2018-05-19 DIAGNOSIS — E1142 Type 2 diabetes mellitus with diabetic polyneuropathy: Secondary | ICD-10-CM

## 2018-05-19 DIAGNOSIS — Z89432 Acquired absence of left foot: Secondary | ICD-10-CM | POA: Diagnosis not present

## 2018-05-19 DIAGNOSIS — B351 Tinea unguium: Secondary | ICD-10-CM | POA: Diagnosis not present

## 2018-05-19 NOTE — Progress Notes (Signed)
Office Visit Note   Patient: Rebekah Johnson           Date of Birth: 17-Feb-1960           MRN: [id number] Visit Date: 05/19/2018              Requested by: No referring provider defined for this encounter. PCP: System, Pcp Not In  Chief Complaint  Patient presents with  . Left Foot - Routine Post Op    Left foot transmet 09/2015 ulcer Right foot nail trim      HPI: The patient is a 58 yo woman who is seen for recheck of bilateral feet. She has a history of a left transmetatarsal amputation in 09/2015 and has a small area of callus over the distal residual foot. She also wound like a toe nail trim on the right foot as she has onychomycotic nails and is unable to safely trim her nails due to peripheral neuropathy.   Assessment & Plan: Visit Diagnoses:  1. Status post transmetatarsal amputation of foot, left ([place])   2. Onychomycosis   3. Diabetic polyneuropathy associated with type 2 diabetes mellitus ([place])   4. CKD (chronic kidney disease), stage IV ([place])   5. Chronic combined systolic and diastolic heart failure (HCC)     Plan: Right toenails were trimmed x 5. The callus over the left TMA medially was debrided with a #10 blade knife and the patient tolerated this well. She will follow up in 3 months.   Follow-Up Instructions: Return in about 3 months (around 08/19/2018).   Ortho Exam  Patient is alert, oriented, no adenopathy, well-dressed, normal affect, normal respiratory effort. The patient has palpable pedal pulses bilaterally.  The left foot TMA has callus over the distal medial aspect and after informed consent, this was debrided with a #10 blade knife and the patient tolerated this well. There is no visible underlying ulcer. No other areas of skin breakdown or cellulitis or infection.  The right foot toenails were trimmed x 5 as the patient is unable to do safely . Onychomycosis noted. Good pedal pulses. No signs of infection or cellulitis. No skin breakdown.   Imaging:  No results found. No images are attached to the encounter.  Labs: Lab Results  Component Value Date   HGBA1C 6.9 (A) 03/03/2018   HGBA1C 6.8 (A) 10/07/2017   HGBA1C 7.4 (A) 08/05/2017   ESRSEDRATE 57 (H) 07/13/2015   CRP 0.7 07/13/2015   LABURIC 5.7 07/13/2015   REPTSTATUS 01/04/2017 FINAL 01/04/2017   CULT (A) 01/01/2017    VIRIDANS STREPTOCOCCUS THE SIGNIFICANCE OF ISOLATING THIS ORGANISM FROM A SINGLE SET OF BLOOD CULTURES WHEN MULTIPLE SETS ARE DRAWN IS UNCERTAIN. PLEASE NOTIFY THE MICROBIOLOGY DEPARTMENT WITHIN ONE WEEK IF SPECIATION AND SENSITIVITIES ARE REQUIRED. Performed at [facility], [place] [address], [place], [place] [address]      Lab Results  Component Value Date   ALBUMIN 3.9 04/22/2018   ALBUMIN 4.0 10/07/2017   ALBUMIN 3.3 (L) 07/20/2017   LABURIC 5.7 07/13/2015    Body mass index is 33.47 kg/m.  Orders:  No orders of the defined types were placed in this encounter.  No orders of the defined types were placed in this encounter.    Procedures: No procedures performed  Clinical Data: No additional findings.  ROS:  All other systems negative, except as noted in the HPI. Review of Systems  Objective: Vital Signs: Ht 5\' 4"  (1.626 m)   Wt 195 lb (88.5  kg)   BMI 33.47 kg/m   Specialty Comments:  No specialty comments available.  PMFS History: Patient Active Problem List   Diagnosis Date Noted  . Cardiac arrest ([place]) 11/22/2017  . Thyroid mass 11/05/2017  . DOE (dyspnea on exertion) 09/01/2017  . COPD GOLD  0 08/31/2017  . Diabetic polyneuropathy associated with type 2 diabetes mellitus ([place]) 08/19/2017  . NSTEMI (non-ST elevated myocardial infarction) ([place]) 01/01/2017  . Chest pain 01/01/2017  . Tobacco abuse   . PVD (peripheral vascular disease) ([place])   . Proteinuria   . Obesity   . Normocytic anemia   . Hypertension   . High cholesterol   . Foot amputation status   . Diabetes mellitus with neuropathy ([place])   . CKD  (chronic kidney disease), stage IV ([place])   . Chronic diastolic CHF (congestive heart failure) ([place])   . CAD (coronary artery disease) 10/27/2016  . Chronic kidney disease 09/23/2016  . Acute congestive heart failure ([place])   . Hypertensive heart disease with heart failure ([place])   . Chronic combined systolic and diastolic heart failure ([place]) 09/14/2016  . Onychomycosis 12/24/2015  . Status post transmetatarsal amputation of foot, left ([place]) 09/06/2015  . Diabetic foot ulcer ([place]) 07/13/2015  . Cellulitis 07/12/2015  . HLD (hyperlipidemia) 09/02/2012  . Peripheral nerve disease 09/02/2012  . Type 2 diabetes mellitus with peripheral angiopathy ([place]) 09/02/2012  . Hypertriglyceridemia 07/28/2012  . Peripheral vascular disease ([place]) 07/28/2012  . Avitaminosis D 07/28/2012  . Essential (primary) hypertension 06/10/2012  . History of biliary T-tube placement 06/10/2012  . Leg pain 06/10/2012  . Current tobacco use 06/10/2012  . Atrophic vaginitis 06/07/2012  . Type 2 diabetes mellitus ([place]) 06/07/2012   Past Medical History:  Diagnosis Date  . Acute congestive heart failure ([place])   . Acute diastolic CHF (congestive heart failure) ([place]) 09/14/2016  . Atrophic vaginitis 06/07/2012  . Avitaminosis D 07/28/2012  . CAD (coronary artery disease)    a. NSTEM 09/2016: cath showing severe diffuse disease of the RCA, ramus and Cx with mild-mod disease of LAD; PCI would require multiple stents and significant contrast usage thus medical therapy recommended.  . Cellulitis 07/12/2015  . Chest pain 01/01/2017  . Chronic diastolic CHF (congestive heart failure) ([place])   . Chronic kidney disease 09/23/2016  . CKD (chronic kidney disease), stage IV ([place])   . COPD GOLD  0 08/31/2017   Quit smoking 07/17/17 - Spirometry 09/01/2017  FEV1 1.23 (58%)  Ratio 57 with mild curvature p no rx - 09/01/2017  After extensive coaching inhaler device,  effectiveness =    75% with elipta with cough provoked  - PFT's  10/14/2017   FEV1 1.69 (68 % ) ratio 84  p no % improvement from saba p nothing prior to study with DLCO  50 % corrects to 76  % for alv volume   - 10/14/2017  Walked RA x 3 laps @ 1  . Current tobacco use 06/10/2012  . Diabetes mellitus with neuropathy ([place])   . Diabetic foot ulcer ([place]) 07/13/2015  . Diabetic polyneuropathy associated with type 2 diabetes mellitus ([place]) 08/19/2017  . DOE (dyspnea on exertion) 09/01/2017   09/01/2017  Walked RA x 3 laps @ 185 ft each stopped due to  End of study, nl to mod fast  pace, no  desat   But stopped to rest x 2  Spirometry 09/01/2017  FEV1 1.23 (58%)  Ratio 57  > trial of anoro    . Essential (  primary) hypertension 06/10/2012  . Foot amputation status    a. h/o left foot transmetatarsal amputation  . High cholesterol   . History of biliary T-tube placement 06/10/2012  . History of cardiac arrest   . HLD (hyperlipidemia) 09/02/2012   Overview:  ICD-10 cut over    . Hypertension    a. patent renal arteries by PV angio 08/2015. b. heavy proteinuria 09/2016 ? nephrotic.  [name] Hypertensive heart disease with heart failure ([place])   . Hypertriglyceridemia 07/28/2012  . Leg pain 06/10/2012  . Normocytic anemia   . NSTEMI (non-ST elevated myocardial infarction) ([place]) 01/01/2017  . Obesity   . Onychomycosis 12/24/2015  . Peripheral nerve disease 09/02/2012  . Peripheral vascular disease ([place]) 07/28/2012  . Proteinuria   . PVD (peripheral vascular disease) ([place])    a. status post left SFA and popliteal stent and left SFA PTA with drug coated balloon in 08/2015.  . Status post transmetatarsal amputation of foot, left ([place]) 09/06/2015  . Tobacco abuse   . Type 2 diabetes mellitus ([place]) 06/07/2012  . Type 2 diabetes mellitus with peripheral angiopathy ([place]) 09/02/2012    Family History  Problem Relation Age of Onset  . Diabetes Mother   . Hypertension Father     Past Surgical History:  Procedure Laterality Date  . ABDOMINAL HYSTERECTOMY    . AMPUTATION Left 09/06/2015   Procedure: LEFT  TRANSMETATARSAL AMPUTATTION;  Surgeon: Newt Minion, MD;  Location: [facility];  Service: Orthopedics;  Laterality: Left;  . LEFT HEART CATH AND CORONARY ANGIOGRAPHY N/A 09/17/2016   Procedure: LEFT HEART CATH AND CORONARY ANGIOGRAPHY;  Surgeon: Jettie Booze, MD;  Location: [facility];  Service: Cardiovascular;  Laterality: N/A;  . PERIPHERAL VASCULAR CATHETERIZATION N/A 08/16/2015   Procedure: Abdominal Aortogram;  Surgeon: Elam Dutch, MD;  Location: [facility];  Service: Cardiovascular;  Laterality: N/A;  . PERIPHERAL VASCULAR CATHETERIZATION Bilateral 08/16/2015   Procedure: Lower Extremity Angiography;  Surgeon: Elam Dutch, MD;  Location: [facility];  Service: Cardiovascular;  Laterality: Bilateral;  . PERIPHERAL VASCULAR CATHETERIZATION Left 08/16/2015   Procedure: Peripheral Vascular Intervention;  Surgeon: Elam Dutch, MD;  Location: [facility];  Service: Cardiovascular;  Laterality: Left;  SFA STENT X 2   Social History   Occupational History  . Not on file  Tobacco Use  . Smoking status: Former Smoker    Packs/day: 1.00    Years: 42.00    Pack years: 42.00    Types: Cigarettes    Last attempt to quit: 07/17/2017    Years since quitting: 0.8  . Smokeless tobacco: Never Used  Substance and Sexual Activity  . Alcohol use: Yes    Alcohol/week: 1.0 - 2.0 standard drinks    Types: 1 - 2 Glasses of wine per week    Comment: on social occassions  . Drug use: No  . Sexual activity: Not on file

## 2018-06-30 ENCOUNTER — Telehealth: Payer: Self-pay | Admitting: *Deleted

## 2018-06-30 NOTE — Telephone Encounter (Signed)
LMOM for patient to call the office to see if she would like to switch her 07/18/18 virtual visit to an in office visit.

## 2018-07-11 ENCOUNTER — Ambulatory Visit (INDEPENDENT_AMBULATORY_CARE_PROVIDER_SITE_OTHER): Payer: Medicare Other | Admitting: Orthopedic Surgery

## 2018-07-11 ENCOUNTER — Encounter: Payer: Self-pay | Admitting: Orthopedic Surgery

## 2018-07-11 ENCOUNTER — Other Ambulatory Visit: Payer: Self-pay

## 2018-07-11 VITALS — Ht 64.0 in | Wt 195.0 lb

## 2018-07-11 DIAGNOSIS — L97421 Non-pressure chronic ulcer of left heel and midfoot limited to breakdown of skin: Secondary | ICD-10-CM

## 2018-07-11 DIAGNOSIS — Z89432 Acquired absence of left foot: Secondary | ICD-10-CM | POA: Diagnosis not present

## 2018-07-11 DIAGNOSIS — B351 Tinea unguium: Secondary | ICD-10-CM

## 2018-07-11 MED ORDER — SILVER SULFADIAZINE 1 % EX CREA: 1.0000 "application " | TOPICAL_CREAM | Freq: Every day | CUTANEOUS | 3 refills | Status: DC

## 2018-07-11 NOTE — Addendum Note (Signed)
Addended by: [name] on: 07/11/2018 01:39 PM   Modules accepted: Orders

## 2018-07-11 NOTE — Progress Notes (Signed)
Office Visit Note   Patient: Rebekah Johnson           Date of Birth: Aug 01, 1960           MRN: [id number] Visit Date: 07/11/2018              Requested by: No referring provider defined for this encounter. PCP: System, Pcp Not In  Chief Complaint  Patient presents with  . Right Foot - Follow-up  . Left Foot - Follow-up      HPI: Patient is a 58 year old woman who presents with a ulcer over the medial aspect of her left transmetatarsal amputation as well as painful onychomycotic nails of her right foot.  Patient feels like her toes are being pressed together in her shoe wear.  Assessment & Plan: Visit Diagnoses:  1. Onychomycosis   2. Status post transmetatarsal amputation of foot, left ([place])   3. Lower limb ulcer, heel or midfoot, left, limited to breakdown of skin ([place])     Plan: Recommended a wider sneakers such as new balance we will reevaluate in 2 months.  Follow-Up Instructions: Return in about 2 months (around 09/11/2018).   Ortho Exam  Patient is alert, oriented, no adenopathy, well-dressed, normal affect, normal respiratory effort. Examination patient's left foot has a stable left transmetatarsal amputation.  There is a ulceration over the medial border of the transmetatarsal amputation.  After informed consent a 10 blade knife was used to debride the skin and soft tissue back to healthy viable tissue there is no deep abscess no exposed bone or tendon.  The ulcer is 10 mm in diameter and 2 mm deep.  Examination the right foot there are no plantar ulcers no ischemic changes she has thickened discolored onychomycotic nails x5 she is unable to safely trim the nails on her own and nails were trimmed x5 without complication.  Patient has tight restrictive shoe wear and needs wider shoes.  Imaging: No results found. No images are attached to the encounter.  Labs: Lab Results  Component Value Date   HGBA1C 6.9 (A) 03/03/2018   HGBA1C 6.8 (A) 10/07/2017   HGBA1C 7.4 (A)  08/05/2017   ESRSEDRATE 57 (H) 07/13/2015   CRP 0.7 07/13/2015   LABURIC 5.7 07/13/2015   REPTSTATUS 01/04/2017 FINAL 01/04/2017   CULT (A) 01/01/2017    VIRIDANS STREPTOCOCCUS THE SIGNIFICANCE OF ISOLATING THIS ORGANISM FROM A SINGLE SET OF BLOOD CULTURES WHEN MULTIPLE SETS ARE DRAWN IS UNCERTAIN. PLEASE NOTIFY THE MICROBIOLOGY DEPARTMENT WITHIN ONE WEEK IF SPECIATION AND SENSITIVITIES ARE REQUIRED. Performed at [facility], [place] [address], [place], [place] [address]      Lab Results  Component Value Date   ALBUMIN 3.9 04/22/2018   ALBUMIN 4.0 10/07/2017   ALBUMIN 3.3 (L) 07/20/2017   LABURIC 5.7 07/13/2015    Lab Results  Component Value Date   MG 1.7 01/01/2017   MG 1.7 09/16/2016   MG 1.7 09/14/2016   No results found for: VD25OH  No results found for: PREALBUMIN CBC EXTENDED Latest Ref Rng & Units 04/22/2018 07/20/2017 06/04/2017  WBC 4.0 - 10.5 K/uL 11.3(H) 8.4 13.2(H)  RBC 3.87 - 5.11 MIL/uL 4.91 3.98 5.29(H)  HGB 12.0 - 15.0 g/dL 13.4 10.8(L) 14.6  HCT 36.0 - 46.0 % 42.9 32.9(L) 41.9  PLT 150 - 400 K/uL 239 229 262  NEUTROABS 1.7 - 7.7 K/uL 8.1(H) 5.9 9.0(H)  LYMPHSABS 0.7 - 4.0 K/uL 2.1 1.6 3.0     Body mass index is 33.47  kg/m.  Orders:  No orders of the defined types were placed in this encounter.  No orders of the defined types were placed in this encounter.    Procedures: No procedures performed  Clinical Data: No additional findings.  ROS:  All other systems negative, except as noted in the HPI. Review of Systems  Objective: Vital Signs: Ht 5\' 4"  (1.626 m)   Wt 195 lb (88.5 kg)   BMI 33.47 kg/m   Specialty Comments:  No specialty comments available.  PMFS History: Patient Active Problem List   Diagnosis Date Noted  . Cardiac arrest ([place]) 11/22/2017  . Thyroid mass 11/05/2017  . DOE (dyspnea on exertion) 09/01/2017  . COPD GOLD  0 08/31/2017  . Diabetic polyneuropathy associated with type 2 diabetes mellitus ([place])  08/19/2017  . NSTEMI (non-ST elevated myocardial infarction) ([place]) 01/01/2017  . Chest pain 01/01/2017  . Tobacco abuse   . PVD (peripheral vascular disease) ([place])   . Proteinuria   . Obesity   . Normocytic anemia   . Hypertension   . High cholesterol   . Foot amputation status   . Diabetes mellitus with neuropathy ([place])   . CKD (chronic kidney disease), stage IV ([place])   . Chronic diastolic CHF (congestive heart failure) ([place])   . CAD (coronary artery disease) 10/27/2016  . Chronic kidney disease 09/23/2016  . Acute congestive heart failure ([place])   . Hypertensive heart disease with heart failure ([place])   . Chronic combined systolic and diastolic heart failure ([place]) 09/14/2016  . Onychomycosis 12/24/2015  . Status post transmetatarsal amputation of foot, left ([place]) 09/06/2015  . Diabetic foot ulcer ([place]) 07/13/2015  . Cellulitis 07/12/2015  . HLD (hyperlipidemia) 09/02/2012  . Peripheral nerve disease 09/02/2012  . Type 2 diabetes mellitus with peripheral angiopathy ([place]) 09/02/2012  . Hypertriglyceridemia 07/28/2012  . Peripheral vascular disease ([place]) 07/28/2012  . Avitaminosis D 07/28/2012  . Essential (primary) hypertension 06/10/2012  . History of biliary T-tube placement 06/10/2012  . Leg pain 06/10/2012  . Current tobacco use 06/10/2012  . Atrophic vaginitis 06/07/2012  . Type 2 diabetes mellitus ([place]) 06/07/2012   Past Medical History:  Diagnosis Date  . Acute congestive heart failure ([place])   . Acute diastolic CHF (congestive heart failure) ([place]) 09/14/2016  . Atrophic vaginitis 06/07/2012  . Avitaminosis D 07/28/2012  . CAD (coronary artery disease)    a. NSTEM 09/2016: cath showing severe diffuse disease of the RCA, ramus and Cx with mild-mod disease of LAD; PCI would require multiple stents and significant contrast usage thus medical therapy recommended.  . Cellulitis 07/12/2015  . Chest pain 01/01/2017  . Chronic diastolic CHF (congestive heart failure) ([place])   .  Chronic kidney disease 09/23/2016  . CKD (chronic kidney disease), stage IV ([place])   . COPD GOLD  0 08/31/2017   Quit smoking 07/17/17 - Spirometry 09/01/2017  FEV1 1.23 (58%)  Ratio 57 with mild curvature p no rx - 09/01/2017  After extensive coaching inhaler device,  effectiveness =    75% with elipta with cough provoked  - PFT's  10/14/2017  FEV1 1.69 (68 % ) ratio 84  p no % improvement from saba p nothing prior to study with DLCO  50 % corrects to 76  % for alv volume   - 10/14/2017  Walked RA x 3 laps @ 1  . Current tobacco use 06/10/2012  . Diabetes mellitus with neuropathy ([place])   . Diabetic foot ulcer ([place]) 07/13/2015  . Diabetic polyneuropathy associated  with type 2 diabetes mellitus ([place] [address] [place]) 08/19/2017  . DOE (dyspnea on exertion) 09/01/2017   09/01/2017  Walked RA x 3 laps @ 185 ft each stopped due to  End of study, nl to mod fast  pace, no  desat   But stopped to rest x 2  Spirometry 09/01/2017  FEV1 1.23 (58%)  Ratio 57  > trial of anoro    . Essential (primary) hypertension 06/10/2012  . Foot amputation status    a. h/o left foot transmetatarsal amputation  . High cholesterol   . History of biliary T-tube placement 06/10/2012  . History of cardiac arrest   . HLD (hyperlipidemia) 09/02/2012   Overview:  ICD-10 cut over    . Hypertension    a. patent renal arteries by PV angio 08/2015. b. heavy proteinuria 09/2016 ? nephrotic.  [name] Hypertensive heart disease with heart failure ([place])   . Hypertriglyceridemia 07/28/2012  . Leg pain 06/10/2012  . Normocytic anemia   . NSTEMI (non-ST elevated myocardial infarction) ([place]) 01/01/2017  . Obesity   . Onychomycosis 12/24/2015  . Peripheral nerve disease 09/02/2012  . Peripheral vascular disease ([place]) 07/28/2012  . Proteinuria   . PVD (peripheral vascular disease) ([place])    a. status post left SFA and popliteal stent and left SFA PTA with drug coated balloon in 08/2015.  . Status post transmetatarsal amputation of foot, left ([place]) 09/06/2015  . Tobacco abuse   .  Type 2 diabetes mellitus ([place]) 06/07/2012  . Type 2 diabetes mellitus with peripheral angiopathy ([place]) 09/02/2012    Family History  Problem Relation Age of Onset  . Diabetes Mother   . Hypertension Father     Past Surgical History:  Procedure Laterality Date  . ABDOMINAL HYSTERECTOMY    . AMPUTATION Left 09/06/2015   Procedure: LEFT TRANSMETATARSAL AMPUTATTION;  Surgeon: Newt Minion, MD;  Location: [facility];  Service: Orthopedics;  Laterality: Left;  . LEFT HEART CATH AND CORONARY ANGIOGRAPHY N/A 09/17/2016   Procedure: LEFT HEART CATH AND CORONARY ANGIOGRAPHY;  Surgeon: Jettie Booze, MD;  Location: [facility];  Service: Cardiovascular;  Laterality: N/A;  . PERIPHERAL VASCULAR CATHETERIZATION N/A 08/16/2015   Procedure: Abdominal Aortogram;  Surgeon: Elam Dutch, MD;  Location: [facility];  Service: Cardiovascular;  Laterality: N/A;  . PERIPHERAL VASCULAR CATHETERIZATION Bilateral 08/16/2015   Procedure: Lower Extremity Angiography;  Surgeon: Elam Dutch, MD;  Location: [facility];  Service: Cardiovascular;  Laterality: Bilateral;  . PERIPHERAL VASCULAR CATHETERIZATION Left 08/16/2015   Procedure: Peripheral Vascular Intervention;  Surgeon: Elam Dutch, MD;  Location: [facility];  Service: Cardiovascular;  Laterality: Left;  SFA STENT X 2   Social History   Occupational History  . Not on file  Tobacco Use  . Smoking status: Former Smoker    Packs/day: 1.00    Years: 42.00    Pack years: 42.00    Types: Cigarettes    Quit date: 07/17/2017    Years since quitting: 0.9  . Smokeless tobacco: Never Used  Substance and Sexual Activity  . Alcohol use: Yes    Alcohol/week: 1.0 - 2.0 standard drinks    Types: 1 - 2 Glasses of wine per week    Comment: on social occassions  . Drug use: No  . Sexual activity: Not on file

## 2018-07-13 NOTE — Telephone Encounter (Signed)
.      COVID-19 Pre-Screening Questions:  . In the past 7 to 10 days have you had a cough,  shortness of breath, headache, congestion, fever (100 or greater) body aches, chills, sore throat, or sudden loss of taste or sense of smell?NO . Have you been around anyone with known Covid 19.NO . Have you been around anyone who is awaiting Covid 19 test results in the past 7 to 10 days?NO . Have you been around anyone who has been exposed to Covid 19, or has mentioned symptoms of Covid 19 within the past 7 to 10 days?NO   Pt covid pre-screen questions were done and answered "no" to all.  Pt is aware of no visitor policy and to wear her face mask at all times to this appt.  Pt aware not to arrive more that 15 minutes prior to appointment time Pt verbalized understanding and agrees with this plan   If you have any concerns/questions about symptoms patients report during screening (either on the phone or at threshold). Contact the provider seeing the patient or DOD for further guidance.  If neither are available contact a member of the leadership team.            

## 2018-07-18 ENCOUNTER — Ambulatory Visit: Payer: Medicare Other | Admitting: Cardiology

## 2018-08-05 ENCOUNTER — Other Ambulatory Visit: Payer: Self-pay | Admitting: Endocrinology

## 2018-08-05 NOTE — Telephone Encounter (Signed)
Confused about her LOV. 05/05/18 does not have any documentation from you so I can only assume she was last seen 02/2018. Please advise how you would like to proceed

## 2018-08-05 NOTE — Telephone Encounter (Signed)
Please refill x 1 F/u is due  

## 2018-08-22 ENCOUNTER — Ambulatory Visit: Payer: Medicare Other | Admitting: Orthopedic Surgery

## 2018-08-27 ENCOUNTER — Telehealth: Payer: Self-pay | Admitting: Endocrinology

## 2018-08-27 NOTE — Telephone Encounter (Signed)
Please sched next appt. Then Please refill x 1, pending that appt.

## 2018-08-29 ENCOUNTER — Other Ambulatory Visit: Payer: Self-pay

## 2018-08-29 DIAGNOSIS — E1100 Type 2 diabetes mellitus with hyperosmolarity without nonketotic hyperglycemic-hyperosmolar coma (NKHHC): Secondary | ICD-10-CM

## 2018-08-29 MED ORDER — GLIPIZIDE 10 MG PO TABS: 10.0000 mg | ORAL_TABLET | Freq: Every day | ORAL | 0 refills | Status: DC

## 2018-08-29 NOTE — Telephone Encounter (Signed)
glipiZIDE (GLUCOTROL) 10 MG tablet 30 tablet 0 08/29/2018    Sig - Route: Take 1 tablet (10 mg total) by mouth daily before breakfast. - Oral   Sent to pharmacy as: glipiZIDE (GLUCOTROL) 10 MG tablet   E-Prescribing Status: Receipt confirmed by pharmacy (08/29/2018 11:39 AM EDT)

## 2018-08-29 NOTE — Telephone Encounter (Signed)
Patient scheduled for F/U appointment tomorrow 08/30/2018 at 11:00 AM

## 2018-08-29 NOTE — Telephone Encounter (Signed)
Per Dr. Loanne Drilling, unable to refill Rx without an appt. This message being routed to the front desk for scheduling purposes.

## 2018-08-30 ENCOUNTER — Ambulatory Visit: Payer: Medicare Other | Admitting: Endocrinology

## 2018-08-31 ENCOUNTER — Ambulatory Visit (INDEPENDENT_AMBULATORY_CARE_PROVIDER_SITE_OTHER): Payer: Medicare Other | Admitting: Endocrinology

## 2018-08-31 ENCOUNTER — Encounter: Payer: Self-pay | Admitting: Endocrinology

## 2018-08-31 ENCOUNTER — Other Ambulatory Visit: Payer: Self-pay

## 2018-08-31 VITALS — BP 124/62 | HR 63 | Ht 64.0 in | Wt 185.6 lb

## 2018-08-31 DIAGNOSIS — E042 Nontoxic multinodular goiter: Secondary | ICD-10-CM

## 2018-08-31 DIAGNOSIS — E1100 Type 2 diabetes mellitus with hyperosmolarity without nonketotic hyperglycemic-hyperosmolar coma (NKHHC): Secondary | ICD-10-CM | POA: Diagnosis not present

## 2018-08-31 LAB — T4, FREE: Free T4: 1.05 ng/dL (ref 0.60–1.60)

## 2018-08-31 LAB — TSH: TSH: 0.66 u[IU]/mL (ref 0.35–4.50)

## 2018-08-31 LAB — POCT GLYCOSYLATED HEMOGLOBIN (HGB A1C): Hemoglobin A1C: 5.5 % (ref 4.0–5.6)

## 2018-08-31 MED ORDER — GLIPIZIDE 5 MG PO TABS: 5.0000 mg | ORAL_TABLET | Freq: Every day | ORAL | 3 refills | Status: DC

## 2018-08-31 NOTE — Patient Instructions (Addendum)
check your blood sugar once a day.  vary the time of day when you check, between before the 3 meals, and at bedtime.  also check if you have symptoms of your blood sugar being too high or too low.  please keep a record of the readings and bring it to your next appointment here (or you can bring the meter itself).  You can write it on any piece of paper.  please call us sooner if your blood sugar goes below 70, or if you have a lot of readings over 200.    I have sent a prescription to your pharmacy, to reduce the glipizide to 5 mg. A different type of diabetes blood test is requested for you today.  We'll let you know about the results.   We will need to take this complex situation in stages.   Our goals are an a1c of aprox 7.0%, and being off the glipizide. Please come back for a follow-up appointment in 2 months.

## 2018-08-31 NOTE — Progress Notes (Signed)
Subjective:    Patient ID: Rebekah Johnson, female    DOB: 12-22-1960, 58 y.o.   MRN: [id number]  HPI Pt returns for f/u of diabetes mellitus: DM type: 2 Dx'ed: 7106 Complications: polyneuropathy, PAD, CAD, renal failure, and foot ulcer.  Therapy: 2 oral meds GDM: G0 DKA: never Severe hypoglycemia: never Pancreatitis: never Pancreatic imaging: normal on 2011 CT Other: she has never been on insulin.  Interval history: she has not recently checked cbg.  pt states she feels well in general.   Pt also hs MNG (dx'ed 2011; bx is 2014 was Beth cat 2; f/u US in 2019 was unchanged; she is euthyroid off any rx).  Past Medical History:  Diagnosis Date  . Acute congestive heart failure ([place])   . Acute diastolic CHF (congestive heart failure) ([place]) 09/14/2016  . Atrophic vaginitis 06/07/2012  . Avitaminosis D 07/28/2012  . CAD (coronary artery disease)    a. NSTEM 09/2016: cath showing severe diffuse disease of the RCA, ramus and Cx with mild-mod disease of LAD; PCI would require multiple stents and significant contrast usage thus medical therapy recommended.  . Cellulitis 07/12/2015  . Chest pain 01/01/2017  . Chronic diastolic CHF (congestive heart failure) ([place])   . Chronic kidney disease 09/23/2016  . CKD (chronic kidney disease), stage IV ([place])   . COPD GOLD  0 08/31/2017   Quit smoking 07/17/17 - Spirometry 09/01/2017  FEV1 1.23 (58%)  Ratio 57 with mild curvature p no rx - 09/01/2017  After extensive coaching inhaler device,  effectiveness =    75% with elipta with cough provoked  - PFT's  10/14/2017  FEV1 1.69 (68 % ) ratio 84  p no % improvement from saba p nothing prior to study with DLCO  50 % corrects to 76  % for alv volume   - 10/14/2017  Walked RA x 3 laps @ 1  . Current tobacco use 06/10/2012  . Diabetes mellitus with neuropathy ([place])   . Diabetic foot ulcer ([employer]) 07/13/2015  . Diabetic polyneuropathy associated with type 2 diabetes mellitus ([place]) 08/19/2017  . DOE (dyspnea on exertion)  09/01/2017   09/01/2017  Walked RA x 3 laps @ 185 ft each stopped due to  End of study, nl to mod fast  pace, no  desat   But stopped to rest x 2  Spirometry 09/01/2017  FEV1 1.23 (58%)  Ratio 57  > trial of anoro    . Essential (primary) hypertension 06/10/2012  . Foot amputation status    a. h/o left foot transmetatarsal amputation  . High cholesterol   . History of biliary T-tube placement 06/10/2012  . History of cardiac arrest   . HLD (hyperlipidemia) 09/02/2012   Overview:  ICD-10 cut over    . Hypertension    a. patent renal arteries by PV angio 08/2015. b. heavy proteinuria 09/2016 ? nephrotic.  [name] Hypertensive heart disease with heart failure ([place])   . Hypertriglyceridemia 07/28/2012  . Leg pain 06/10/2012  . Normocytic anemia   . NSTEMI (non-ST elevated myocardial infarction) ([place]) 01/01/2017  . Obesity   . Onychomycosis 12/24/2015  . Peripheral nerve disease 09/02/2012  . Peripheral vascular disease ([place]) 07/28/2012  . Proteinuria   . PVD (peripheral vascular disease) ([place])    a. status post left SFA and popliteal stent and left SFA PTA with drug coated balloon in 08/2015.  . Status post transmetatarsal amputation of foot, left ([place]) 09/06/2015  . Tobacco abuse   . Type 2 diabetes  mellitus ([place]) 06/07/2012  . Type 2 diabetes mellitus with peripheral angiopathy ([place]) 09/02/2012    Past Surgical History:  Procedure Laterality Date  . ABDOMINAL HYSTERECTOMY    . AMPUTATION Left 09/06/2015   Procedure: LEFT TRANSMETATARSAL AMPUTATTION;  Surgeon: Newt Minion, MD;  Location: [facility];  Service: Orthopedics;  Laterality: Left;  . LEFT HEART CATH AND CORONARY ANGIOGRAPHY N/A 09/17/2016   Procedure: LEFT HEART CATH AND CORONARY ANGIOGRAPHY;  Surgeon: Jettie Booze, MD;  Location: [facility];  Service: Cardiovascular;  Laterality: N/A;  . PERIPHERAL VASCULAR CATHETERIZATION N/A 08/16/2015   Procedure: Abdominal Aortogram;  Surgeon: Elam Dutch, MD;  Location: [facility];   Service: Cardiovascular;  Laterality: N/A;  . PERIPHERAL VASCULAR CATHETERIZATION Bilateral 08/16/2015   Procedure: Lower Extremity Angiography;  Surgeon: Elam Dutch, MD;  Location: [facility];  Service: Cardiovascular;  Laterality: Bilateral;  . PERIPHERAL VASCULAR CATHETERIZATION Left 08/16/2015   Procedure: Peripheral Vascular Intervention;  Surgeon: Elam Dutch, MD;  Location: [facility];  Service: Cardiovascular;  Laterality: Left;  SFA STENT X 2    Social History   Socioeconomic History  . Marital status: Married    Spouse name: Not on file  . Number of children: Not on file  . Years of education: Not on file  . Highest education level: Not on file  Occupational History  . Not on file  Social Needs  . Financial resource strain: Not on file  . Food insecurity    Worry: Not on file    Inability: Not on file  . Transportation needs    Medical: Not on file    Non-medical: Not on file  Tobacco Use  . Smoking status: Former Smoker    Packs/day: 1.00    Years: 42.00    Pack years: 42.00    Types: Cigarettes    Quit date: 07/17/2017    Years since quitting: 1.1  . Smokeless tobacco: Never Used  Substance and Sexual Activity  . Alcohol use: Yes    Alcohol/week: 1.0 - 2.0 standard drinks    Types: 1 - 2 Glasses of wine per week    Comment: on social occassions  . Drug use: No  . Sexual activity: Not on file  Lifestyle  . Physical activity    Days per week: Not on file    Minutes per session: Not on file  . Stress: Not on file  Relationships  . Social Herbalist on phone: Not on file    Gets together: Not on file    Attends religious service: Not on file    Active member of club or organization: Not on file    Attends meetings of clubs or organizations: Not on file    Relationship status: Not on file  . Intimate partner violence    Fear of current or ex partner: Not on file    Emotionally abused: Not on file    Physically abused:  Not on file    Forced sexual activity: Not on file  Other Topics Concern  . Not on file  Social History Narrative  . Not on file    Current Outpatient Medications on File Prior to Visit  Medication Sig Dispense Refill  . amLODipine (NORVASC) 10 MG tablet Take 1 tablet (10 mg total) by mouth daily. Reported on 07/13/2015 90 tablet 2  . Ascorbic Acid (VITAMIN C) 1000 MG tablet Take 1,000 mg by mouth daily.    [name]  aspirin 81 MG EC tablet Take 1 tablet (81 mg total) by mouth daily. Reported on 07/13/2015 90 tablet 0  . atorvastatin (LIPITOR) 80 MG tablet Take 1 tablet (80 mg total) by mouth daily. 90 tablet 2  . Blood Glucose Monitoring Suppl (ACCU-CHEK AVIVA) device Use as instructed three times daily. 1 each 0  . carvedilol (COREG) 25 MG tablet Take 1 tablet (25 mg total) by mouth 2 (two) times daily. 180 tablet 2  . Cholecalciferol (VITAMIN D-3) 1000 units CAPS Take 1,000 Units by mouth daily.     . clopidogrel (PLAVIX) 75 MG tablet Take 1 tablet (75 mg total) by mouth daily. 90 tablet 1  . docusate sodium (COLACE) 100 MG capsule Take 200 mg by mouth 2 (two) times daily.     . furosemide (LASIX) 80 MG tablet Take 80 mg by mouth 2 (two) times daily.    [name] gabapentin (NEURONTIN) 300 MG capsule Take 1 capsule (300 mg total) by mouth 3 (three) times daily. 90 capsule 3  . glucose blood (ACCU-CHEK AVIVA) test strip Use as instructed three times daily before meals. 100 each 12  . hydrALAZINE (APRESOLINE) 100 MG tablet Take 1 tablet (100 mg total) by mouth 3 (three) times daily. 90 tablet 1  . isosorbide mononitrate (IMDUR) 30 MG 24 hr tablet Take 60 mg by mouth daily.    Elmore Guise Devices (ACCU-CHEK SOFTCLIX) lancets Use as instructed three times daily before meals. 1 each 5  . latanoprost (XALATAN) 0.005 % ophthalmic solution 1 drop at bedtime.    . Omega-3 Fatty Acids (FISH OIL) 1000 MG CAPS Take 2,000 mg by mouth daily. Reported on 07/13/2015    . ranolazine (RANEXA) 500 MG 12 hr tablet Take 1 tablet  (500 mg total) by mouth 2 (two) times daily. 180 tablet 3  . silver sulfADIAZINE (SILVADENE) 1 % cream Apply 1 application topically daily. Apply to affected area daily plus dry dressing 400 g 3  . sitaGLIPtin (JANUVIA) 25 MG tablet Take 1 tablet (25 mg total) by mouth daily. 30 tablet 3  . nitroGLYCERIN (NITROSTAT) 0.4 MG SL tablet Place 1 tablet (0.4 mg total) under the tongue every 5 (five) minutes as needed for chest pain. 25 tablet 3   No current facility-administered medications on file prior to visit.     No Known Allergies  Family History  Problem Relation Age of Onset  . Diabetes Mother   . Hypertension Father     BP 124/62 (BP Location: Left Arm, Patient Position: Sitting, Cuff Size: Large)   Pulse 63   Ht 5\' 4"  (1.626 m)   Wt 185 lb 9.6 oz (84.2 kg)   SpO2 97%   BMI 31.86 kg/m    Review of Systems She denies hypoglycemia    Objective:   Physical Exam VITAL SIGNS:  See vs page GENERAL: no distress Pulses: dorsalis pedis intact bilat.   MSK: left transmetatarsal amputation CV: trace bilat leg edema Skin:  no ulcer on the feet.  normal color and temp on the feet. Neuro: sensation is intact to touch on the feet Ext: There is bilateral onychomycosis of the right foot toenails.   Lab Results  Component Value Date   CREATININE 3.38 (HH) 04/22/2018   BUN 37 (H) 04/22/2018   NA 141 04/22/2018   K 3.8 04/22/2018   CL 104 04/22/2018   CO2 24 04/22/2018   A1c=5.5%  Lab Results  Component Value Date   TSH 0.853 10/07/2017   T4TOTAL 8.3  10/07/2017        Assessment & Plan:  Type 2 DM: overcontrolled Noncompliance with cbg recording: this complicates the rx of DM.  Renal failure: she should minimize glipizide.  MNG: check TFT.   Patient Instructions  check your blood sugar once a day.  vary the time of day when you check, between before the 3 meals, and at bedtime.  also check if you have symptoms of your blood sugar being too high or too low.  please keep  a record of the readings and bring it to your next appointment here (or you can bring the meter itself).  You can write it on any piece of paper.  please call us sooner if your blood sugar goes below 70, or if you have a lot of readings over 200.    I have sent a prescription to your pharmacy, to reduce the glipizide to 5 mg. A different type of diabetes blood test is requested for you today.  We'll let you know about the results.   We will need to take this complex situation in stages.   Our goals are an a1c of aprox 7.0%, and being off the glipizide. Please come back for a follow-up appointment in 2 months.

## 2018-09-03 LAB — FRUCTOSAMINE: Fructosamine: 264 umol/L (ref 205–285)

## 2018-09-10 ENCOUNTER — Other Ambulatory Visit: Payer: Self-pay | Admitting: Family Medicine

## 2018-09-10 DIAGNOSIS — E1149 Type 2 diabetes mellitus with other diabetic neurological complication: Secondary | ICD-10-CM

## 2018-09-15 ENCOUNTER — Ambulatory Visit: Payer: Medicare Other | Admitting: Orthopedic Surgery

## 2018-09-20 ENCOUNTER — Ambulatory Visit (INDEPENDENT_AMBULATORY_CARE_PROVIDER_SITE_OTHER): Payer: Medicare Other | Admitting: Orthopedic Surgery

## 2018-09-20 ENCOUNTER — Other Ambulatory Visit: Payer: Self-pay

## 2018-09-20 ENCOUNTER — Encounter: Payer: Self-pay | Admitting: Orthopedic Surgery

## 2018-09-20 VITALS — Ht 64.0 in | Wt 185.0 lb

## 2018-09-20 DIAGNOSIS — E1142 Type 2 diabetes mellitus with diabetic polyneuropathy: Secondary | ICD-10-CM

## 2018-09-20 DIAGNOSIS — B351 Tinea unguium: Secondary | ICD-10-CM | POA: Diagnosis not present

## 2018-09-20 DIAGNOSIS — L97421 Non-pressure chronic ulcer of left heel and midfoot limited to breakdown of skin: Secondary | ICD-10-CM

## 2018-09-20 NOTE — Progress Notes (Signed)
Office Visit Note   Patient: Rebekah Johnson           Date of Birth: 04/25/1960           MRN: [id number] Visit Date: 09/20/2018              Requested by: No referring provider defined for this encounter. PCP: Dallas Schimke, MD  Chief Complaint  Patient presents with  . Left Foot - Nail Problem  . Right Foot - Nail Problem      HPI: Patient is a 58 year old woman who presents with a callused ulcer over the medial aspect of her left transmetatarsal amputation as well as painful onychomycotic nails of her right foot.   Assessment & Plan: Visit Diagnoses:  No diagnosis found.  Plan: nails and callus trimmed x 5. Tolerated well.  Follow-Up Instructions: Return in about 2 months (around 11/20/2018).   Ortho Exam  Patient is alert, oriented, no adenopathy, well-dressed, normal affect, normal respiratory effort. Examination patient's left foot has a stable left transmetatarsal amputation.  There is a callused ulceration over the medial border of the transmetatarsal amputation.  After informed consent a 10 blade knife was used to debride the skin and soft tissue back to healthy viable tissue there is no deep abscess no exposed bone or tendon.  The ulcer is 10 mm in diameter and 5 mm thick.  Examination the right foot there are no plantar ulcers no ischemic changes she has thickened discolored onychomycotic nails x5 she is unable to safely trim the nails on her own and nails were trimmed x5 without complication.    Imaging: No results found. No images are attached to the encounter.  Labs: Lab Results  Component Value Date   HGBA1C 5.5 08/31/2018   HGBA1C 6.9 (A) 03/03/2018   HGBA1C 6.8 (A) 10/07/2017   ESRSEDRATE 57 (H) 07/13/2015   CRP 0.7 07/13/2015   LABURIC 5.7 07/13/2015   REPTSTATUS 01/04/2017 FINAL 01/04/2017   CULT (A) 01/01/2017    VIRIDANS STREPTOCOCCUS THE SIGNIFICANCE OF ISOLATING THIS ORGANISM FROM A SINGLE SET OF BLOOD CULTURES WHEN MULTIPLE SETS ARE DRAWN  IS UNCERTAIN. PLEASE NOTIFY THE MICROBIOLOGY DEPARTMENT WITHIN ONE WEEK IF SPECIATION AND SENSITIVITIES ARE REQUIRED. Performed at [facility], [place] [address], [place], [place] [address]      Lab Results  Component Value Date   ALBUMIN 3.9 04/22/2018   ALBUMIN 4.0 10/07/2017   ALBUMIN 3.3 (L) 07/20/2017   LABURIC 5.7 07/13/2015    Lab Results  Component Value Date   MG 1.7 01/01/2017   MG 1.7 09/16/2016   MG 1.7 09/14/2016   No results found for: VD25OH  No results found for: PREALBUMIN CBC EXTENDED Latest Ref Rng & Units 04/22/2018 07/20/2017 06/04/2017  WBC 4.0 - 10.5 K/uL 11.3(H) 8.4 13.2(H)  RBC 3.87 - 5.11 MIL/uL 4.91 3.98 5.29(H)  HGB 12.0 - 15.0 g/dL 13.4 10.8(L) 14.6  HCT 36.0 - 46.0 % 42.9 32.9(L) 41.9  PLT 150 - 400 K/uL 239 229 262  NEUTROABS 1.7 - 7.7 K/uL 8.1(H) 5.9 9.0(H)  LYMPHSABS 0.7 - 4.0 K/uL 2.1 1.6 3.0     Body mass index is 31.76 kg/m.  Orders:  No orders of the defined types were placed in this encounter.  No orders of the defined types were placed in this encounter.    Procedures: No procedures performed  Clinical Data: No additional findings.  ROS:  All other systems negative, except as noted in the HPI. Review  of Systems  Constitutional: Negative for chills and fever.  Cardiovascular: Negative for leg swelling.  Skin: Negative for wound.    Objective: Vital Signs: Ht 5\' 4"  (1.626 m)   Wt 185 lb (83.9 kg)   BMI 31.76 kg/m   Specialty Comments:  No specialty comments available.  PMFS History: Patient Active Problem List   Diagnosis Date Noted  . Multinodular goiter 08/31/2018  . Cardiac arrest ([place]) 11/22/2017  . DOE (dyspnea on exertion) 09/01/2017  . COPD GOLD  0 08/31/2017  . Diabetic polyneuropathy associated with type 2 diabetes mellitus ([place]) 08/19/2017  . NSTEMI (non-ST elevated myocardial infarction) ([place]) 01/01/2017  . Chest pain 01/01/2017  . Tobacco abuse   . PVD (peripheral vascular disease) ([place])    . Proteinuria   . Obesity   . Normocytic anemia   . Hypertension   . High cholesterol   . Foot amputation status   . Diabetes mellitus with neuropathy ([place])   . CKD (chronic kidney disease), stage IV ([place])   . Chronic diastolic CHF (congestive heart failure) ([place])   . CAD (coronary artery disease) 10/27/2016  . Chronic kidney disease 09/23/2016  . Acute congestive heart failure ([place])   . Hypertensive heart disease with heart failure ([place])   . Chronic combined systolic and diastolic heart failure ([place]) 09/14/2016  . Onychomycosis 12/24/2015  . Status post transmetatarsal amputation of foot, left ([place]) 09/06/2015  . Diabetic foot ulcer ([place]) 07/13/2015  . Cellulitis 07/12/2015  . HLD (hyperlipidemia) 09/02/2012  . Peripheral nerve disease 09/02/2012  . Type 2 diabetes mellitus with peripheral angiopathy ([place]) 09/02/2012  . Hypertriglyceridemia 07/28/2012  . Peripheral vascular disease ([place]) 07/28/2012  . Avitaminosis D 07/28/2012  . Essential (primary) hypertension 06/10/2012  . History of biliary T-tube placement 06/10/2012  . Leg pain 06/10/2012  . Current tobacco use 06/10/2012  . Atrophic vaginitis 06/07/2012  . Type 2 diabetes mellitus ([place]) 06/07/2012   Past Medical History:  Diagnosis Date  . Acute congestive heart failure ([place])   . Acute diastolic CHF (congestive heart failure) ([place]) 09/14/2016  . Atrophic vaginitis 06/07/2012  . Avitaminosis D 07/28/2012  . CAD (coronary artery disease)    a. NSTEM 09/2016: cath showing severe diffuse disease of the RCA, ramus and Cx with mild-mod disease of LAD; PCI would require multiple stents and significant contrast usage thus medical therapy recommended.  . Cellulitis 07/12/2015  . Chest pain 01/01/2017  . Chronic diastolic CHF (congestive heart failure) ([place])   . Chronic kidney disease 09/23/2016  . CKD (chronic kidney disease), stage IV ([place])   . COPD GOLD  0 08/31/2017   Quit smoking 07/17/17 - Spirometry 09/01/2017  FEV1 1.23 (58%)   Ratio 57 with mild curvature p no rx - 09/01/2017  After extensive coaching inhaler device,  effectiveness =    75% with elipta with cough provoked  - PFT's  10/14/2017  FEV1 1.69 (68 % ) ratio 84  p no % improvement from saba p nothing prior to study with DLCO  50 % corrects to 76  % for alv volume   - 10/14/2017  Walked RA x 3 laps @ 1  . Current tobacco use 06/10/2012  . Diabetes mellitus with neuropathy ([place])   . Diabetic foot ulcer ([place]) 07/13/2015  . Diabetic polyneuropathy associated with type 2 diabetes mellitus ([place]) 08/19/2017  . DOE (dyspnea on exertion) 09/01/2017   09/01/2017  Walked RA x 3 laps @ 185 ft each stopped due to  End of study,  nl to mod fast  pace, no  desat   But stopped to rest x 2  Spirometry 09/01/2017  FEV1 1.23 (58%)  Ratio 57  > trial of anoro    . Essential (primary) hypertension 06/10/2012  . Foot amputation status    a. h/o left foot transmetatarsal amputation  . High cholesterol   . History of biliary T-tube placement 06/10/2012  . History of cardiac arrest   . HLD (hyperlipidemia) 09/02/2012   Overview:  ICD-10 cut over    . Hypertension    a. patent renal arteries by PV angio 08/2015. b. heavy proteinuria 09/2016 ? nephrotic.  [name] Hypertensive heart disease with heart failure ([place])   . Hypertriglyceridemia 07/28/2012  . Leg pain 06/10/2012  . Normocytic anemia   . NSTEMI (non-ST elevated myocardial infarction) ([place]) 01/01/2017  . Obesity   . Onychomycosis 12/24/2015  . Peripheral nerve disease 09/02/2012  . Peripheral vascular disease ([place]) 07/28/2012  . Proteinuria   . PVD (peripheral vascular disease) ([place])    a. status post left SFA and popliteal stent and left SFA PTA with drug coated balloon in 08/2015.  . Status post transmetatarsal amputation of foot, left ([place]) 09/06/2015  . Tobacco abuse   . Type 2 diabetes mellitus ([place]) 06/07/2012  . Type 2 diabetes mellitus with peripheral angiopathy ([place]) 09/02/2012    Family History  Problem Relation Age of Onset  .  Diabetes Mother   . Hypertension Father     Past Surgical History:  Procedure Laterality Date  . ABDOMINAL HYSTERECTOMY    . AMPUTATION Left 09/06/2015   Procedure: LEFT TRANSMETATARSAL AMPUTATTION;  Surgeon: Newt Minion, MD;  Location: [facility];  Service: Orthopedics;  Laterality: Left;  . LEFT HEART CATH AND CORONARY ANGIOGRAPHY N/A 09/17/2016   Procedure: LEFT HEART CATH AND CORONARY ANGIOGRAPHY;  Surgeon: Jettie Booze, MD;  Location: [facility];  Service: Cardiovascular;  Laterality: N/A;  . PERIPHERAL VASCULAR CATHETERIZATION N/A 08/16/2015   Procedure: Abdominal Aortogram;  Surgeon: Elam Dutch, MD;  Location: [facility];  Service: Cardiovascular;  Laterality: N/A;  . PERIPHERAL VASCULAR CATHETERIZATION Bilateral 08/16/2015   Procedure: Lower Extremity Angiography;  Surgeon: Elam Dutch, MD;  Location: [facility];  Service: Cardiovascular;  Laterality: Bilateral;  . PERIPHERAL VASCULAR CATHETERIZATION Left 08/16/2015   Procedure: Peripheral Vascular Intervention;  Surgeon: Elam Dutch, MD;  Location: [facility];  Service: Cardiovascular;  Laterality: Left;  SFA STENT X 2   Social History   Occupational History  . Not on file  Tobacco Use  . Smoking status: Former Smoker    Packs/day: 1.00    Years: 42.00    Pack years: 42.00    Types: Cigarettes    Quit date: 07/17/2017    Years since quitting: 1.1  . Smokeless tobacco: Never Used  Substance and Sexual Activity  . Alcohol use: Yes    Alcohol/week: 1.0 - 2.0 standard drinks    Types: 1 - 2 Glasses of wine per week    Comment: on social occassions  . Drug use: No  . Sexual activity: Not on file

## 2018-10-04 ENCOUNTER — Other Ambulatory Visit: Payer: Self-pay

## 2018-10-04 DIAGNOSIS — D689 Coagulation defect, unspecified: Secondary | ICD-10-CM | POA: Insufficient documentation

## 2018-10-04 DIAGNOSIS — D631 Anemia in chronic kidney disease: Secondary | ICD-10-CM | POA: Insufficient documentation

## 2018-10-04 DIAGNOSIS — D509 Iron deficiency anemia, unspecified: Secondary | ICD-10-CM | POA: Insufficient documentation

## 2018-10-04 DIAGNOSIS — N2581 Secondary hyperparathyroidism of renal origin: Secondary | ICD-10-CM | POA: Insufficient documentation

## 2018-10-04 DIAGNOSIS — N184 Chronic kidney disease, stage 4 (severe): Secondary | ICD-10-CM

## 2018-10-04 DIAGNOSIS — I739 Peripheral vascular disease, unspecified: Secondary | ICD-10-CM

## 2018-10-06 ENCOUNTER — Telehealth (HOSPITAL_COMMUNITY): Payer: Self-pay

## 2018-10-06 NOTE — Telephone Encounter (Signed)
Contacted patient who needed to reschedule tomorrow's appointments.  Forwarded that information to appropriate staff [employer] via email.

## 2018-10-07 ENCOUNTER — Ambulatory Visit (HOSPITAL_COMMUNITY): Payer: Medicare Other

## 2018-10-07 ENCOUNTER — Other Ambulatory Visit (HOSPITAL_COMMUNITY): Payer: Medicare Other

## 2018-10-07 ENCOUNTER — Ambulatory Visit: Payer: Medicare Other | Admitting: Vascular Surgery

## 2018-11-01 ENCOUNTER — Ambulatory Visit (INDEPENDENT_AMBULATORY_CARE_PROVIDER_SITE_OTHER): Payer: Medicare Other | Admitting: Endocrinology

## 2018-11-01 ENCOUNTER — Encounter: Payer: Self-pay | Admitting: Endocrinology

## 2018-11-01 ENCOUNTER — Other Ambulatory Visit: Payer: Self-pay

## 2018-11-01 VITALS — BP 120/60 | HR 78 | Ht 64.0 in | Wt 163.4 lb

## 2018-11-01 DIAGNOSIS — E1100 Type 2 diabetes mellitus with hyperosmolarity without nonketotic hyperglycemic-hyperosmolar coma (NKHHC): Secondary | ICD-10-CM | POA: Diagnosis not present

## 2018-11-01 DIAGNOSIS — E042 Nontoxic multinodular goiter: Secondary | ICD-10-CM | POA: Diagnosis not present

## 2018-11-01 LAB — POCT GLYCOSYLATED HEMOGLOBIN (HGB A1C): Hemoglobin A1C: 5.6 % (ref 4.0–5.6)

## 2018-11-01 MED ORDER — GLIPIZIDE 5 MG PO TABS: 2.5000 mg | ORAL_TABLET | Freq: Every day | ORAL | 3 refills | Status: DC

## 2018-11-01 NOTE — Progress Notes (Signed)
Subjective:    Patient ID: Rebekah Johnson, female    DOB: [birth year], 58 y.o.   MRN: [id number]  HPI Pt returns for f/u of diabetes mellitus: DM type: 2 Dx'ed: 6759 Complications: polyneuropathy, PAD, CAD, ESRD (on HD), and foot ulcer.   Therapy: 2 oral meds GDM: G0 DKA: never Severe hypoglycemia: never Pancreatitis: never Pancreatic imaging: normal on 2011 CT Other: she has never been on insulin; fructosamine is c/w A1c approx 1% higher than A1c itself  Interval history: she brings a record of her cbg's which I have reviewed today.  All are in the low-100's.  pt states she feels well in general;  Pt also hs MNG (dx'ed 2011; bx is 2014 was Beth cat 2; f/u US in 2019 was unchanged; she is euthyroid off any rx).   Past Medical History:  Diagnosis Date  . Acute congestive heart failure ([place])   . Acute diastolic CHF (congestive heart failure) ([place]) 09/14/2016  . Atrophic vaginitis 06/07/2012  . Avitaminosis D 07/28/2012  . CAD (coronary artery disease)    a. NSTEM 09/2016: cath showing severe diffuse disease of the RCA, ramus and Cx with mild-mod disease of LAD; PCI would require multiple stents and significant contrast usage thus medical therapy recommended.  . Cellulitis 07/12/2015  . Chest pain 01/01/2017  . Chronic diastolic CHF (congestive heart failure) ([place])   . Chronic kidney disease 09/23/2016  . CKD (chronic kidney disease), stage IV ([place])   . COPD GOLD  0 08/31/2017   Quit smoking 07/17/17 - Spirometry 09/01/2017  FEV1 1.23 (58%)  Ratio 57 with mild curvature p no rx - 09/01/2017  After extensive coaching inhaler device,  effectiveness =    75% with elipta with cough provoked  - PFT's  10/14/2017  FEV1 1.69 (68 % ) ratio 84  p no % improvement from saba p nothing prior to study with DLCO  50 % corrects to 76  % for alv volume   - 10/14/2017  Walked RA x 3 laps @ 1  . Current tobacco use 06/10/2012  . Diabetes mellitus with neuropathy ([place])   . Diabetic foot ulcer ([place]) 07/13/2015  .  Diabetic polyneuropathy associated with type 2 diabetes mellitus ([place]) 08/19/2017  . DOE (dyspnea on exertion) 09/01/2017   09/01/2017  Walked RA x 3 laps @ 185 ft each stopped due to  End of study, nl to mod fast  pace, no  desat   But stopped to rest x 2  Spirometry 09/01/2017  FEV1 1.23 (58%)  Ratio 57  > trial of anoro    . Essential (primary) hypertension 06/10/2012  . Foot amputation status    a. h/o left foot transmetatarsal amputation  . High cholesterol   . History of biliary T-tube placement 06/10/2012  . History of cardiac arrest   . HLD (hyperlipidemia) 09/02/2012   Overview:  ICD-10 cut over    . Hypertension    a. patent renal arteries by PV angio 08/2015. b. heavy proteinuria 09/2016 ? nephrotic.  [name] Hypertensive heart disease with heart failure ([place])   . Hypertriglyceridemia 07/28/2012  . Leg pain 06/10/2012  . Normocytic anemia   . NSTEMI (non-ST elevated myocardial infarction) ([place]) 01/01/2017  . Obesity   . Onychomycosis 12/24/2015  . Peripheral nerve disease 09/02/2012  . Peripheral vascular disease ([place]) 07/28/2012  . Proteinuria   . PVD (peripheral vascular disease) ([place])    a. status post left SFA and popliteal stent and left SFA PTA with drug coated  balloon in 08/2015.  . Status post transmetatarsal amputation of foot, left ([place]) 09/06/2015  . Tobacco abuse   . Type 2 diabetes mellitus ([place]) 06/07/2012  . Type 2 diabetes mellitus with peripheral angiopathy ([place]) 09/02/2012    Past Surgical History:  Procedure Laterality Date  . ABDOMINAL HYSTERECTOMY    . AMPUTATION Left 09/06/2015   Procedure: LEFT TRANSMETATARSAL AMPUTATTION;  Surgeon: Newt Minion, MD;  Location: [facility];  Service: Orthopedics;  Laterality: Left;  . LEFT HEART CATH AND CORONARY ANGIOGRAPHY N/A 09/17/2016   Procedure: LEFT HEART CATH AND CORONARY ANGIOGRAPHY;  Surgeon: Jettie Booze, MD;  Location: [facility];  Service: Cardiovascular;  Laterality: N/A;  . PERIPHERAL VASCULAR CATHETERIZATION N/A  08/16/2015   Procedure: Abdominal Aortogram;  Surgeon: Elam Dutch, MD;  Location: [facility];  Service: Cardiovascular;  Laterality: N/A;  . PERIPHERAL VASCULAR CATHETERIZATION Bilateral 08/16/2015   Procedure: Lower Extremity Angiography;  Surgeon: Elam Dutch, MD;  Location: [facility];  Service: Cardiovascular;  Laterality: Bilateral;  . PERIPHERAL VASCULAR CATHETERIZATION Left 08/16/2015   Procedure: Peripheral Vascular Intervention;  Surgeon: Elam Dutch, MD;  Location: [facility];  Service: Cardiovascular;  Laterality: Left;  SFA STENT X 2    Social History   Socioeconomic History  . Marital status: Married    Spouse name: Not on file  . Number of children: Not on file  . Years of education: Not on file  . Highest education level: Not on file  Occupational History  . Not on file  Social Needs  . Financial resource strain: Not on file  . Food insecurity    Worry: Not on file    Inability: Not on file  . Transportation needs    Medical: Not on file    Non-medical: Not on file  Tobacco Use  . Smoking status: Former Smoker    Packs/day: 1.00    Years: 42.00    Pack years: 42.00    Types: Cigarettes    Quit date: 07/17/2017    Years since quitting: 1.2  . Smokeless tobacco: Never Used  Substance and Sexual Activity  . Alcohol use: Yes    Alcohol/week: 1.0 - 2.0 standard drinks    Types: 1 - 2 Glasses of wine per week    Comment: on social occassions  . Drug use: No  . Sexual activity: Not on file  Lifestyle  . Physical activity    Days per week: Not on file    Minutes per session: Not on file  . Stress: Not on file  Relationships  . Social Herbalist on phone: Not on file    Gets together: Not on file    Attends religious service: Not on file    Active member of club or organization: Not on file    Attends meetings of clubs or organizations: Not on file    Relationship status: Not on file  . Intimate partner  violence    Fear of current or ex partner: Not on file    Emotionally abused: Not on file    Physically abused: Not on file    Forced sexual activity: Not on file  Other Topics Concern  . Not on file  Social History Narrative  . Not on file    Current Outpatient Medications on File Prior to Visit  Medication Sig Dispense Refill  . amLODipine (NORVASC) 10 MG tablet Take 1 tablet (10 mg total)  by mouth daily. Reported on 07/13/2015 90 tablet 2  . Ascorbic Acid (VITAMIN C) 1000 MG tablet Take 1,000 mg by mouth daily.    [name] aspirin 81 MG EC tablet Take 1 tablet (81 mg total) by mouth daily. Reported on 07/13/2015 90 tablet 0  . atorvastatin (LIPITOR) 80 MG tablet Take 1 tablet (80 mg total) by mouth daily. 90 tablet 2  . Blood Glucose Monitoring Suppl (ACCU-CHEK AVIVA) device Use as instructed three times daily. 1 each 0  . carvedilol (COREG) 25 MG tablet Take 1 tablet (25 mg total) by mouth 2 (two) times daily. 180 tablet 2  . Cholecalciferol (VITAMIN D-3) 1000 units CAPS Take 1,000 Units by mouth daily.     . clopidogrel (PLAVIX) 75 MG tablet Take 1 tablet (75 mg total) by mouth daily. 90 tablet 1  . docusate sodium (COLACE) 100 MG capsule Take 200 mg by mouth 2 (two) times daily.     . furosemide (LASIX) 80 MG tablet Take 80 mg by mouth 2 (two) times daily.    [name] gabapentin (NEURONTIN) 300 MG capsule Take 1 capsule (300 mg total) by mouth 3 (three) times daily. 90 capsule 3  . glucose blood (ACCU-CHEK AVIVA) test strip Use as instructed three times daily before meals. 100 each 12  . hydrALAZINE (APRESOLINE) 100 MG tablet Take 1 tablet (100 mg total) by mouth 3 (three) times daily. 90 tablet 1  . isosorbide mononitrate (IMDUR) 30 MG 24 hr tablet Take 60 mg by mouth daily.    Elmore Guise Devices (ACCU-CHEK SOFTCLIX) lancets Use as instructed three times daily before meals. 1 each 5  . latanoprost (XALATAN) 0.005 % ophthalmic solution 1 drop at bedtime.    . Omega-3 Fatty Acids (FISH OIL) 1000 MG  CAPS Take 2,000 mg by mouth daily. Reported on 07/13/2015    . ranolazine (RANEXA) 500 MG 12 hr tablet Take 1 tablet (500 mg total) by mouth 2 (two) times daily. 180 tablet 3  . silver sulfADIAZINE (SILVADENE) 1 % cream Apply 1 application topically daily. Apply to affected area daily plus dry dressing 400 g 3  . sitaGLIPtin (JANUVIA) 25 MG tablet Take 1 tablet (25 mg total) by mouth daily. 30 tablet 3  . nitroGLYCERIN (NITROSTAT) 0.4 MG SL tablet Place 1 tablet (0.4 mg total) under the tongue every 5 (five) minutes as needed for chest pain. 25 tablet 3   No current facility-administered medications on file prior to visit.     No Known Allergies  Family History  Problem Relation Age of Onset  . Diabetes Mother   . Hypertension Father     BP 120/60 (BP Location: Left Arm, Patient Position: Sitting, Cuff Size: Normal)   Pulse 78   Ht 5\' 4"  (1.626 m)   Wt 163 lb 6.4 oz (74.1 kg)   SpO2 98%   BMI 28.05 kg/m    Review of Systems She denies hypoglycemia.     Objective:   Physical Exam VITAL SIGNS:  See vs page GENERAL: no distress Pulses: dorsalis pedis intact bilat.   MSK: no deformity of the feet CV: no leg edema Skin:  no ulcer on the feet.  normal color and temp on the feet.  Neuro: sensation is intact to touch on the feet.    Lab Results  Component Value Date   CREATININE 3.38 (HH) 04/22/2018   BUN 37 (H) 04/22/2018   NA 141 04/22/2018   K 3.8 04/22/2018   CL 104 04/22/2018  CO2 24 04/22/2018   Lab Results  Component Value Date   HGBA1C 5.6 11/01/2018       Assessment & Plan:  Type 2 DM, with PAD: uncertain glycemic control Abnormality of glucose-hemoglobin interaction, due to renal failure.  Fructosamine is a more reliable indicator of glycemic control. MNG: due for recheck.  Patient Instructions  check your blood sugar once a day.  vary the time of day when you check, between before the 3 meals, and at bedtime.  also check if you have symptoms of your blood  sugar being too high or too low.  please keep a record of the readings and bring it to your next appointment here (or you can bring the meter itself).  You can write it on any piece of paper.  please call us sooner if your blood sugar goes below 70, or if you have a lot of readings over 200.    I have sent a prescription to your pharmacy, to reduce the glipizide to 1/2 of 5 mg pill each morning. Our goals are an a1c of aprox 7.0%, and being off the glipizide.   Let's recheck the ultrasound.  you will receive a phone call, about a day and time for an appointment.   Please come back for a follow-up appointment in 3 months.

## 2018-11-01 NOTE — Patient Instructions (Addendum)
check your blood sugar once a day.  vary the time of day when you check, between before the 3 meals, and at bedtime.  also check if you have symptoms of your blood sugar being too high or too low.  please keep a record of the readings and bring it to your next appointment here (or you can bring the meter itself).  You can write it on any piece of paper.  please call us sooner if your blood sugar goes below 70, or if you have a lot of readings over 200.    I have sent a prescription to your pharmacy, to reduce the glipizide to 1/2 of 5 mg pill each morning. Our goals are an a1c of aprox 7.0%, and being off the glipizide.   Let's recheck the ultrasound.  you will receive a phone call, about a day and time for an appointment.   Please come back for a follow-up appointment in 3 months.

## 2018-11-10 ENCOUNTER — Ambulatory Visit
Admission: RE | Admit: 2018-11-10 | Discharge: 2018-11-10 | Disposition: A | Discharge status: Home / Self Care | Payer: Medicare Other | Source: Ambulatory Visit | Attending: Endocrinology | Admitting: Endocrinology

## 2018-11-10 DIAGNOSIS — E042 Nontoxic multinodular goiter: Secondary | ICD-10-CM

## 2018-11-21 ENCOUNTER — Ambulatory Visit: Payer: Medicare Other | Admitting: Orthopedic Surgery

## 2018-11-29 DIAGNOSIS — E441 Mild protein-calorie malnutrition: Secondary | ICD-10-CM | POA: Insufficient documentation

## 2018-12-08 ENCOUNTER — Telehealth (HOSPITAL_COMMUNITY): Payer: Self-pay

## 2018-12-08 NOTE — Telephone Encounter (Signed)

## 2018-12-09 ENCOUNTER — Ambulatory Visit (HOSPITAL_COMMUNITY)
Admission: RE | Admit: 2018-12-09 | Discharge: 2018-12-09 | Disposition: A | Discharge status: Home / Self Care | Payer: Medicare Other | Source: Ambulatory Visit | Attending: Vascular Surgery | Admitting: Vascular Surgery

## 2018-12-09 ENCOUNTER — Other Ambulatory Visit: Payer: Self-pay

## 2018-12-09 ENCOUNTER — Ambulatory Visit (INDEPENDENT_AMBULATORY_CARE_PROVIDER_SITE_OTHER): Payer: Medicare Other | Admitting: Vascular Surgery

## 2018-12-09 ENCOUNTER — Encounter: Payer: Self-pay | Admitting: *Deleted

## 2018-12-09 ENCOUNTER — Other Ambulatory Visit: Payer: Self-pay | Admitting: *Deleted

## 2018-12-09 ENCOUNTER — Inpatient Hospital Stay (HOSPITAL_COMMUNITY): Admission: RE | Admit: 2018-12-09 | Payer: Medicare Other | Source: Ambulatory Visit

## 2018-12-09 ENCOUNTER — Ambulatory Visit (INDEPENDENT_AMBULATORY_CARE_PROVIDER_SITE_OTHER)
Admission: RE | Admit: 2018-12-09 | Discharge: 2018-12-09 | Disposition: A | Discharge status: Home / Self Care | Payer: Medicare Other | Source: Ambulatory Visit | Attending: Vascular Surgery | Admitting: Vascular Surgery

## 2018-12-09 VITALS — BP 184/81 | HR 72 | Temp 97.3°F | Resp 20 | Ht 64.0 in | Wt 163.0 lb

## 2018-12-09 DIAGNOSIS — Z20822 Contact with and (suspected) exposure to covid-19: Secondary | ICD-10-CM

## 2018-12-09 DIAGNOSIS — N186 End stage renal disease: Secondary | ICD-10-CM | POA: Diagnosis not present

## 2018-12-09 DIAGNOSIS — N184 Chronic kidney disease, stage 4 (severe): Secondary | ICD-10-CM | POA: Diagnosis present

## 2018-12-09 NOTE — Progress Notes (Signed)
Patient ID: Rebekah Johnson, female   DOB: November 06, 1960, 58 y.o.   MRN: [id number]  Reason for Consult: Follow-up   Referred by Corliss Parish, MD  Subjective:     HPI:  Rebekah Johnson is a 58 y.o. female on dialysis via right IJ catheter.  Currently dialyzing Monday Wednesday Friday.  Does not take blood thinners.  She does take aspirin Plavix.  Has a previous left-sided amputation.  She has never had chest breast or left upper extremity surgery.  She is right-hand dominant.  Past Medical History:  Diagnosis Date   Acute congestive heart failure (HCC)    Acute diastolic CHF (congestive heart failure) ([place]) 09/14/2016   Atrophic vaginitis 06/07/2012   Avitaminosis D 07/28/2012   CAD (coronary artery disease)    a. NSTEM 09/2016: cath showing severe diffuse disease of the RCA, ramus and Cx with mild-mod disease of LAD; PCI would require multiple stents and significant contrast usage thus medical therapy recommended.   Cellulitis 07/12/2015   Chest pain 01/01/2017   Chronic diastolic CHF (congestive heart failure) (HCC)    Chronic kidney disease 09/23/2016   CKD (chronic kidney disease), stage IV (HCC)    COPD GOLD  0 08/31/2017   Quit smoking 07/17/17 - Spirometry 09/01/2017  FEV1 1.23 (58%)  Ratio 57 with mild curvature p no rx - 09/01/2017  After extensive coaching inhaler device,  effectiveness =    75% with elipta with cough provoked  - PFT's  10/14/2017  FEV1 1.69 (68 % ) ratio 84  p no % improvement from saba p nothing prior to study with DLCO  50 % corrects to 76  % for alv volume   - 10/14/2017  Walked RA x 3 laps @ 1   Current tobacco use 06/10/2012   Diabetes mellitus with neuropathy ([place])    Diabetic foot ulcer ([place]) 07/13/2015   Diabetic polyneuropathy associated with type 2 diabetes mellitus ([place]) 08/19/2017   DOE (dyspnea on exertion) 09/01/2017   09/01/2017  Walked RA x 3 laps @ 185 ft each stopped due to  End of study, nl to mod fast  pace, no  desat   But stopped to  rest x 2  Spirometry 09/01/2017  FEV1 1.23 (58%)  Ratio 57  > trial of anoro     Essential (primary) hypertension 06/10/2012   Foot amputation status    a. h/o left foot transmetatarsal amputation   High cholesterol    History of biliary T-tube placement 06/10/2012   History of cardiac arrest    HLD (hyperlipidemia) 09/02/2012   Overview:  ICD-10 cut over     Hypertension    a. patent renal arteries by PV angio 08/2015. b. heavy proteinuria 09/2016 ? nephrotic.   Hypertensive heart disease with heart failure (HCC)    Hypertriglyceridemia 07/28/2012   Leg pain 06/10/2012   Normocytic anemia    NSTEMI (non-ST elevated myocardial infarction) ([place]) 01/01/2017   Obesity    Onychomycosis 12/24/2015   Peripheral nerve disease 09/02/2012   Peripheral vascular disease ([place]) 07/28/2012   Proteinuria    PVD (peripheral vascular disease) ([place])    a. status post left SFA and popliteal stent and left SFA PTA with drug coated balloon in 08/2015.   Status post transmetatarsal amputation of foot, left ([place]) 09/06/2015   Tobacco abuse    Type 2 diabetes mellitus ([place]) 06/07/2012   Type 2 diabetes mellitus with peripheral angiopathy ([place]) 09/02/2012   Family History  Problem Relation Age of Onset  Diabetes Mother    Hypertension Father    Past Surgical History:  Procedure Laterality Date   ABDOMINAL HYSTERECTOMY     AMPUTATION Left 09/06/2015   Procedure: LEFT TRANSMETATARSAL AMPUTATTION;  Surgeon: Newt Minion, MD;  Location: [facility];  Service: Orthopedics;  Laterality: Left;   LEFT HEART CATH AND CORONARY ANGIOGRAPHY N/A 09/17/2016   Procedure: LEFT HEART CATH AND CORONARY ANGIOGRAPHY;  Surgeon: Jettie Booze, MD;  Location: [facility];  Service: Cardiovascular;  Laterality: N/A;   PERIPHERAL VASCULAR CATHETERIZATION N/A 08/16/2015   Procedure: Abdominal Aortogram;  Surgeon: Elam Dutch, MD;  Location: [facility];  Service: Cardiovascular;  Laterality: N/A;     PERIPHERAL VASCULAR CATHETERIZATION Bilateral 08/16/2015   Procedure: Lower Extremity Angiography;  Surgeon: Elam Dutch, MD;  Location: [facility];  Service: Cardiovascular;  Laterality: Bilateral;   PERIPHERAL VASCULAR CATHETERIZATION Left 08/16/2015   Procedure: Peripheral Vascular Intervention;  Surgeon: Elam Dutch, MD;  Location: [facility];  Service: Cardiovascular;  Laterality: Left;  SFA STENT X 2    Short Social History:  Social History   Tobacco Use   Smoking status: Former Smoker    Packs/day: 1.00    Years: 42.00    Pack years: 42.00    Types: Cigarettes    Quit date: 07/17/2017    Years since quitting: 1.3   Smokeless tobacco: Never Used  Substance Use Topics   Alcohol use: Yes    Alcohol/week: 1.0 - 2.0 standard drinks    Types: 1 - 2 Glasses of wine per week    Comment: on social occassions    No Known Allergies  Current Outpatient Medications  Medication Sig Dispense Refill   amLODipine (NORVASC) 10 MG tablet Take 1 tablet (10 mg total) by mouth daily. Reported on 07/13/2015 (Patient taking differently: Take 5 mg by mouth daily. Reported on 07/13/2015) 90 tablet 2   Ascorbic Acid (VITAMIN C) 1000 MG tablet Take 1,000 mg by mouth daily.     aspirin 81 MG EC tablet Take 1 tablet (81 mg total) by mouth daily. Reported on 07/13/2015 90 tablet 0   atorvastatin (LIPITOR) 80 MG tablet Take 1 tablet (80 mg total) by mouth daily. 90 tablet 2   Blood Glucose Monitoring Suppl (ACCU-CHEK AVIVA) device Use as instructed three times daily. 1 each 0   carvedilol (COREG) 25 MG tablet Take 1 tablet (25 mg total) by mouth 2 (two) times daily. 180 tablet 2   Cholecalciferol (VITAMIN D-3) 1000 units CAPS Take 1,000 Units by mouth daily.      clopidogrel (PLAVIX) 75 MG tablet Take 1 tablet (75 mg total) by mouth daily. 90 tablet 1   docusate sodium (COLACE) 100 MG capsule Take 200 mg by mouth 2 (two) times daily.      furosemide (LASIX) 80 MG  tablet Take 80 mg by mouth 2 (two) times daily.     gabapentin (NEURONTIN) 300 MG capsule Take 1 capsule (300 mg total) by mouth 3 (three) times daily. 90 capsule 3   glipiZIDE (GLUCOTROL) 5 MG tablet Take 0.5 tablets (2.5 mg total) by mouth daily before breakfast. 30 tablet 3   glucose blood (ACCU-CHEK AVIVA) test strip Use as instructed three times daily before meals. 100 each 12   hydrALAZINE (APRESOLINE) 100 MG tablet Take 1 tablet (100 mg total) by mouth 3 (three) times daily. (Patient taking differently: Take 50 mg by mouth 3 (three) times daily. ) 90 tablet  1   isosorbide mononitrate (IMDUR) 30 MG 24 hr tablet Take 60 mg by mouth daily.     Lancet Devices (ACCU-CHEK SOFTCLIX) lancets Use as instructed three times daily before meals. 1 each 5   latanoprost (XALATAN) 0.005 % ophthalmic solution 1 drop at bedtime.     Omega-3 Fatty Acids (FISH OIL) 1000 MG CAPS Take 2,000 mg by mouth daily. Reported on 07/13/2015     ranolazine (RANEXA) 500 MG 12 hr tablet Take 1 tablet (500 mg total) by mouth 2 (two) times daily. 180 tablet 3   silver sulfADIAZINE (SILVADENE) 1 % cream Apply 1 application topically daily. Apply to affected area daily plus dry dressing 400 g 3   sitaGLIPtin (JANUVIA) 25 MG tablet Take 1 tablet (25 mg total) by mouth daily. 30 tablet 3   nitroGLYCERIN (NITROSTAT) 0.4 MG SL tablet Place 1 tablet (0.4 mg total) under the tongue every 5 (five) minutes as needed for chest pain. 25 tablet 3   No current facility-administered medications for this visit.     Review of Systems  Constitutional:  Constitutional negative. HENT: HENT negative.  Eyes: Eyes negative.  Respiratory: Respiratory negative.  Cardiovascular: Cardiovascular negative.  GI: Gastrointestinal negative.  Musculoskeletal: Musculoskeletal negative.  Skin: Skin negative.  Neurological: Neurological negative. Hematologic: Hematologic/lymphatic negative.  Psychiatric: Psychiatric negative.         Objective:  Objective   Vitals:   12/09/18 0851  BP: (!) 184/81  Pulse: 72  Resp: 20  Temp: (!) 97.3 F (36.3 C)  SpO2: 100%  Weight: 163 lb (73.9 kg)  Height: 5\' 4"  (1.626 m)   Body mass index is 27.98 kg/m.  Physical Exam HENT:     Head: Normocephalic.     Nose: Nose normal.  Eyes:     Pupils: Pupils are equal, round, and reactive to light.  Cardiovascular:     Rate and Rhythm: Regular rhythm.     Pulses:          Radial pulses are 2+ on the right side and 2+ on the left side.  Pulmonary:     Effort: Pulmonary effort is normal.  Abdominal:     General: Abdomen is flat.     Palpations: Abdomen is soft.  Musculoskeletal: Normal range of motion.        General: No swelling.  Skin:    General: Skin is warm.     Capillary Refill: Capillary refill takes less than 2 seconds.  Neurological:     Mental Status: She is alert.  Psychiatric:        Mood and Affect: Mood normal.        Behavior: Behavior normal.        Thought Content: Thought content normal.        Judgment: Judgment normal.     Data: I have independently turbid her bilateral upper extremity duplexes which demonstrate brachial arteries that are biphasic and 0.47 cm bilaterally.  I have independently interpreted her bilateral upper extremity vein mapping which does not demonstrate any suitable vein for dialysis bilaterally.     Assessment/Plan:     58 year old female with end-stage renal disease.  Currently dialyzing via catheter.  Will need left upper extremity AV fistula versus graft.  I discussed with her the risk benefits alternatives including the need for further procedures.  She demonstrates good understanding.  We will get her set up for a nondialysis day in the very near future.     Waynetta Sandy MD  [facility]

## 2018-12-12 ENCOUNTER — Other Ambulatory Visit: Payer: Self-pay

## 2018-12-12 ENCOUNTER — Encounter (HOSPITAL_COMMUNITY): Payer: Self-pay | Admitting: *Deleted

## 2018-12-12 LAB — NOVEL CORONAVIRUS, NAA: SARS-CoV-2, NAA: NOT DETECTED

## 2018-12-12 NOTE — Progress Notes (Addendum)
Ms Rebekah Johnson denies chest pian or shortness of breath.  Patient had Covid test 12/4 - it was negative, patient has been in quarantine since thattime, patient will wear a mask to dialysis today. Ms Rebekah Johnson reports that her A1C was 5.9 last month in dialysis- patient checks CBG 1 time a day, reportes no CBG < 70. I instructed Ms Rebekah Johnson to not takes medications for diabetes the AM of surgery.  I instructed patient to check CBG after awaking and every 2 hours until arrival  to the hospital.  I Instructed patient if CBG is less than 70 to drink  1/2 cup of a clear juice. Recheck CBG in 15 minutes then call pre- op desk at [phone] for further instructions.   I asked anesthesiology PA- C to review chart.

## 2018-12-12 NOTE — Progress Notes (Signed)
Patient will need RAPID COVID test in the morning if her results are not back before her procedure. Patient is aware of the plan

## 2018-12-12 NOTE — Progress Notes (Signed)
Anesthesia Chart Review: Same day workup  Follows with cardiology for hx of CAD status post NSTEMI 09/2016 with severe diffuse disease of the RCA, ramus and circumflex with mild to moderate disease of the LAD. Medical therapy was recommended because multiple stents and significant and contrast would have to be used.Last seen 04/12/18 and per note doing well at that time, stable, recommend continue current medications.   ESRD on HD currently via right IJ. Follows with [employer].  PVD status post left SFA and popliteal stent and left SFA PTA with drug-coated balloon, left transmetatarsal amputation.   DMII followed by endocrinology, last A1c 5.6 on 11/01/18.  Will need DOS labs, eval, and EKG.   EKG 05/25/17: Sinus rhythm. Rate 81. Repol abnrm old.  TTE 01/06/18 (care everywhere): Summary Well preserved LV systolic function with a small area of basal inferoposterior akinesis. Ejection fraction is visually estimated at 29-% Diastolic function appears indeterminae Mild to moderate mitral regurgitation. The aortic valve appears to be trileaflet. There is mild aortic sclerosis noted, with no evidence of stenosis. Signature   Wynonia Musty [facility] Short Stay Center/Anesthesiology Phone [phone] 12/12/2018 11:31 AM

## 2018-12-12 NOTE — Anesthesia Preprocedure Evaluation (Addendum)
Anesthesia Evaluation  Patient identified by MRN, date of birth, ID band Patient awake    Reviewed: Allergy & Precautions, NPO status , Patient's Chart, lab work & pertinent test results, reviewed documented beta blocker date and time   Airway Mallampati: II  TM Distance: >3 FB Neck ROM: Full    Dental no notable dental hx.    Pulmonary shortness of breath and with exertion, COPD, former smoker,    Pulmonary exam normal breath sounds clear to auscultation       Cardiovascular hypertension, Pt. on medications and Pt. on home beta blockers + CAD, + Past MI, + Peripheral Vascular Disease, +CHF and + DOE  Normal cardiovascular exam+ Valvular Problems/Murmurs MR  Rhythm:Regular Rate:Normal  NSTEMI 09/2016 with severe diffuse disease of the RCA, ramus and circumflex with mild to moderate disease of the LAD. Medical therapy was recommended because multiple stents and significant contrast would have to be used in the setting of ESRD- last seen by cardiology 04/2018- no issues.  PVD s/p L TMA  TTE 01/06/18 (care everywhere): Summary Well preserved LV systolic function with a small area of basal inferoposterior akinesis. Ejection fraction is visually estimated at 96-% Diastolic function appears indeterminae Mild to moderate mitral regurgitation. The aortic valve appears to be trileaflet. There is mild aortic sclerosis noted, with no evidence of stenosis.   Hx cardiac arrest-      Neuro/Psych  Headaches, T2DM with peripheral neuropathy    GI/Hepatic negative GI ROS, Neg liver ROS,   Endo/Other  diabetes, Well Controlled, Type 2, Oral Hypoglycemic AgentsLast A1c 5.6  Renal/GU Dialysis and ESRFRenal diseaseHD via R IJ catheter  HD MWF  negative genitourinary   Musculoskeletal negative musculoskeletal ROS (+)   Abdominal   Peds  Hematology  (+) anemia ,   Anesthesia Other Findings   Reproductive/Obstetrics negative OB  ROS                                                            Anesthesia Evaluation  Patient identified by MRN, date of birth, ID band Patient awake    Airway Mallampati: II  TM Distance: >3 FB Neck ROM: Full    Dental  (+) Teeth Intact, Dental Advisory Given   Pulmonary Current Smoker,    breath sounds clear to auscultation + decreased breath sounds      Cardiovascular hypertension, Pt. on medications and Pt. on home beta blockers + Peripheral Vascular Disease  Normal cardiovascular exam Rhythm:Regular Rate:Normal     Neuro/Psych    GI/Hepatic   Endo/Other  diabetes  Renal/GU Renal disease     Musculoskeletal   Abdominal (+) + obese,   Peds  Hematology   Anesthesia Other Findings   Reproductive/Obstetrics                            Anesthesia Physical Anesthesia Plan  ASA: III  Anesthesia Plan: General   Post-op Pain Management:    Induction: Intravenous  Airway Management Planned: LMA  Additional Equipment:   Intra-op Plan:   Post-operative Plan: Extubation in OR  Informed Consent: I have reviewed the patients History and Physical, chart, labs and discussed the procedure including the risks, benefits and alternatives for the proposed anesthesia with the patient or authorized  representative who has indicated his/her understanding and acceptance.   Dental advisory given  Plan Discussed with: CRNA and Surgeon  Anesthesia Plan Comments:        Anesthesia Quick Evaluation  Anesthesia Physical Anesthesia Plan  ASA: III  Anesthesia Plan: MAC and Regional   Post-op Pain Management:  Regional for Post-op pain   Induction: Intravenous  PONV Risk Score and Plan: 2 and TIVA, Propofol infusion and Treatment may vary due to age or medical condition  Airway Management Planned: Natural Airway and Simple Face Mask  Additional Equipment:   Intra-op Plan:   Post-operative Plan:    Informed Consent: I have reviewed the patients History and Physical, chart, labs and discussed the procedure including the risks, benefits and alternatives for the proposed anesthesia with the patient or authorized representative who has indicated his/her understanding and acceptance.       Plan Discussed with: CRNA  Anesthesia Plan Comments:        Anesthesia Quick Evaluation

## 2018-12-13 ENCOUNTER — Ambulatory Visit (HOSPITAL_COMMUNITY): Payer: Medicare Other | Admitting: Certified Registered Nurse Anesthetist

## 2018-12-13 ENCOUNTER — Ambulatory Visit (HOSPITAL_COMMUNITY)
Admission: RE | Admit: 2018-12-13 | Discharge: 2018-12-13 | Disposition: A | Discharge status: Home / Self Care | Payer: Medicare Other | Attending: Vascular Surgery | Admitting: Vascular Surgery

## 2018-12-13 ENCOUNTER — Other Ambulatory Visit: Payer: Self-pay

## 2018-12-13 ENCOUNTER — Encounter (HOSPITAL_COMMUNITY): Payer: Self-pay | Admitting: *Deleted

## 2018-12-13 ENCOUNTER — Encounter (HOSPITAL_COMMUNITY)
Admission: RE | Disposition: A | Discharge status: Home / Self Care | Payer: Self-pay | Source: Home / Self Care | Attending: Vascular Surgery

## 2018-12-13 DIAGNOSIS — Z7984 Long term (current) use of oral hypoglycemic drugs: Secondary | ICD-10-CM | POA: Insufficient documentation

## 2018-12-13 DIAGNOSIS — Z89432 Acquired absence of left foot: Secondary | ICD-10-CM | POA: Diagnosis not present

## 2018-12-13 DIAGNOSIS — N186 End stage renal disease: Secondary | ICD-10-CM | POA: Insufficient documentation

## 2018-12-13 DIAGNOSIS — Z95820 Peripheral vascular angioplasty status with implants and grafts: Secondary | ICD-10-CM | POA: Insufficient documentation

## 2018-12-13 DIAGNOSIS — E7800 Pure hypercholesterolemia, unspecified: Secondary | ICD-10-CM | POA: Diagnosis not present

## 2018-12-13 DIAGNOSIS — E1151 Type 2 diabetes mellitus with diabetic peripheral angiopathy without gangrene: Secondary | ICD-10-CM | POA: Diagnosis not present

## 2018-12-13 DIAGNOSIS — Z87891 Personal history of nicotine dependence: Secondary | ICD-10-CM | POA: Insufficient documentation

## 2018-12-13 DIAGNOSIS — J449 Chronic obstructive pulmonary disease, unspecified: Secondary | ICD-10-CM | POA: Insufficient documentation

## 2018-12-13 DIAGNOSIS — I5032 Chronic diastolic (congestive) heart failure: Secondary | ICD-10-CM | POA: Diagnosis not present

## 2018-12-13 DIAGNOSIS — Z992 Dependence on renal dialysis: Secondary | ICD-10-CM

## 2018-12-13 DIAGNOSIS — Z7982 Long term (current) use of aspirin: Secondary | ICD-10-CM | POA: Diagnosis not present

## 2018-12-13 DIAGNOSIS — I252 Old myocardial infarction: Secondary | ICD-10-CM | POA: Diagnosis not present

## 2018-12-13 DIAGNOSIS — Z7902 Long term (current) use of antithrombotics/antiplatelets: Secondary | ICD-10-CM | POA: Diagnosis not present

## 2018-12-13 DIAGNOSIS — E1122 Type 2 diabetes mellitus with diabetic chronic kidney disease: Secondary | ICD-10-CM | POA: Insufficient documentation

## 2018-12-13 DIAGNOSIS — I2510 Atherosclerotic heart disease of native coronary artery without angina pectoris: Secondary | ICD-10-CM | POA: Diagnosis not present

## 2018-12-13 DIAGNOSIS — I132 Hypertensive heart and chronic kidney disease with heart failure and with stage 5 chronic kidney disease, or end stage renal disease: Secondary | ICD-10-CM | POA: Insufficient documentation

## 2018-12-13 DIAGNOSIS — Z79899 Other long term (current) drug therapy: Secondary | ICD-10-CM | POA: Diagnosis not present

## 2018-12-13 DIAGNOSIS — E1142 Type 2 diabetes mellitus with diabetic polyneuropathy: Secondary | ICD-10-CM | POA: Diagnosis not present

## 2018-12-13 DIAGNOSIS — E781 Pure hyperglyceridemia: Secondary | ICD-10-CM | POA: Diagnosis not present

## 2018-12-13 HISTORY — DX: Headache, unspecified: R51.9

## 2018-12-13 HISTORY — DX: Dyspnea, unspecified: R06.00

## 2018-12-13 HISTORY — DX: End stage renal disease: N18.6

## 2018-12-13 HISTORY — PX: AV FISTULA PLACEMENT: SHX1204

## 2018-12-13 HISTORY — DX: Constipation, unspecified: K59.00

## 2018-12-13 LAB — GLUCOSE, CAPILLARY
Glucose-Capillary: 171 mg/dL — ABNORMAL HIGH (ref 70–99)
Glucose-Capillary: 176 mg/dL — ABNORMAL HIGH (ref 70–99)

## 2018-12-13 LAB — POCT I-STAT, CHEM 8
BUN: 24 mg/dL — ABNORMAL HIGH (ref 6–20)
Calcium, Ion: 1.09 mmol/L — ABNORMAL LOW (ref 1.15–1.40)
Chloride: 96 mmol/L — ABNORMAL LOW (ref 98–111)
Creatinine, Ser: 3.4 mg/dL — ABNORMAL HIGH (ref 0.44–1.00)
Glucose, Bld: 204 mg/dL — ABNORMAL HIGH (ref 70–99)
HCT: 43 % (ref 36.0–46.0)
Hemoglobin: 14.6 g/dL (ref 12.0–15.0)
Potassium: 3.3 mmol/L — ABNORMAL LOW (ref 3.5–5.1)
Sodium: 137 mmol/L (ref 135–145)
TCO2: 29 mmol/L (ref 22–32)

## 2018-12-13 SURGERY — ARTERIOVENOUS (AV) FISTULA CREATION
Anesthesia: Monitor Anesthesia Care | Laterality: Left

## 2018-12-13 MED ORDER — FENTANYL CITRATE (PF) 250 MCG/5ML IJ SOLN
INTRAMUSCULAR | Status: AC
  Filled 2018-12-13: qty 5

## 2018-12-13 MED ORDER — CARVEDILOL 12.5 MG PO TABS
ORAL_TABLET | ORAL | Status: AC
  Filled 2018-12-13: qty 2

## 2018-12-13 MED ORDER — ONDANSETRON HCL 4 MG/2ML IJ SOLN
INTRAMUSCULAR | Status: DC | PRN
  Administered 2018-12-13: 4 mg via INTRAVENOUS

## 2018-12-13 MED ORDER — PHENYLEPHRINE 40 MCG/ML (10ML) SYRINGE FOR IV PUSH (FOR BLOOD PRESSURE SUPPORT)
PREFILLED_SYRINGE | INTRAVENOUS | Status: DC | PRN
  Administered 2018-12-13: 80 ug via INTRAVENOUS

## 2018-12-13 MED ORDER — ACETAMINOPHEN 500 MG PO TABS
ORAL_TABLET | ORAL | Status: AC
  Administered 2018-12-13: 1000 mg
  Filled 2018-12-13: qty 2

## 2018-12-13 MED ORDER — LIDOCAINE 2% (20 MG/ML) 5 ML SYRINGE
INTRAMUSCULAR | Status: AC
  Filled 2018-12-13: qty 5

## 2018-12-13 MED ORDER — ACETAMINOPHEN 500 MG PO TABS: 1000.0000 mg | ORAL_TABLET | Freq: Once | ORAL | Status: DC

## 2018-12-13 MED ORDER — SODIUM CHLORIDE 0.9 % IV SOLN
INTRAVENOUS | Status: DC | PRN
  Administered 2018-12-13: 07:00:00 via INTRAVENOUS

## 2018-12-13 MED ORDER — MIDAZOLAM HCL 2 MG/2ML IJ SOLN
INTRAMUSCULAR | Status: AC
  Filled 2018-12-13: qty 2

## 2018-12-13 MED ORDER — CARVEDILOL 12.5 MG PO TABS
25.0000 mg | ORAL_TABLET | Freq: Once | ORAL | Status: AC
  Administered 2018-12-13: 25 mg via ORAL

## 2018-12-13 MED ORDER — CEFAZOLIN SODIUM-DEXTROSE 2-3 GM-%(50ML) IV SOLR
INTRAVENOUS | Status: DC | PRN
  Administered 2018-12-13: 2 g via INTRAVENOUS

## 2018-12-13 MED ORDER — LIDOCAINE HCL (PF) 2 % IJ SOLN
INTRAMUSCULAR | Status: DC | PRN
  Administered 2018-12-13: 100 mg via PERINEURAL

## 2018-12-13 MED ORDER — ROPIVACAINE HCL 5 MG/ML IJ SOLN
INTRAMUSCULAR | Status: DC | PRN
  Administered 2018-12-13: 25 mL via PERINEURAL

## 2018-12-13 MED ORDER — FENTANYL CITRATE (PF) 250 MCG/5ML IJ SOLN
INTRAMUSCULAR | Status: DC | PRN
  Administered 2018-12-13: 50 ug via INTRAVENOUS
  Administered 2018-12-13 (×2): 25 ug via INTRAVENOUS

## 2018-12-13 MED ORDER — SODIUM CHLORIDE 0.9 % IV SOLN: INTRAVENOUS | Status: DC

## 2018-12-13 MED ORDER — EPHEDRINE 5 MG/ML INJ
INTRAVENOUS | Status: AC
  Filled 2018-12-13: qty 10

## 2018-12-13 MED ORDER — PHENYLEPHRINE 40 MCG/ML (10ML) SYRINGE FOR IV PUSH (FOR BLOOD PRESSURE SUPPORT)
PREFILLED_SYRINGE | INTRAVENOUS | Status: AC
  Filled 2018-12-13: qty 10

## 2018-12-13 MED ORDER — MIDAZOLAM HCL 5 MG/5ML IJ SOLN
INTRAMUSCULAR | Status: DC | PRN
  Administered 2018-12-13 (×2): 1 mg via INTRAVENOUS

## 2018-12-13 MED ORDER — HEPARIN SODIUM (PORCINE) 1000 UNIT/ML IJ SOLN: 1.6000 mL | Freq: Once | INTRAMUSCULAR | Status: DC

## 2018-12-13 MED ORDER — PROPOFOL 10 MG/ML IV BOLUS
INTRAVENOUS | Status: AC
  Filled 2018-12-13: qty 20

## 2018-12-13 MED ORDER — ONDANSETRON HCL 4 MG/2ML IJ SOLN
INTRAMUSCULAR | Status: AC
  Filled 2018-12-13: qty 2

## 2018-12-13 MED ORDER — SODIUM CHLORIDE 0.9 % IV SOLN
INTRAVENOUS | Status: DC | PRN
  Administered 2018-12-13: 500 mL

## 2018-12-13 MED ORDER — CEFAZOLIN SODIUM 1 G IJ SOLR
INTRAMUSCULAR | Status: AC
  Filled 2018-12-13: qty 20

## 2018-12-13 MED ORDER — 0.9 % SODIUM CHLORIDE (POUR BTL) OPTIME
TOPICAL | Status: DC | PRN
  Administered 2018-12-13: 1000 mL

## 2018-12-13 MED ORDER — LIDOCAINE-EPINEPHRINE 1 %-1:100000 IJ SOLN
INTRAMUSCULAR | Status: AC
  Filled 2018-12-13: qty 1

## 2018-12-13 MED ORDER — SODIUM CHLORIDE 0.9 % IV SOLN
INTRAVENOUS | Status: AC
  Filled 2018-12-13: qty 1.2

## 2018-12-13 MED ORDER — PROPOFOL 500 MG/50ML IV EMUL
INTRAVENOUS | Status: DC | PRN
  Administered 2018-12-13: 50 ug/kg/min via INTRAVENOUS

## 2018-12-13 MED ORDER — PHENYLEPHRINE HCL-NACL 10-0.9 MG/250ML-% IV SOLN
INTRAVENOUS | Status: DC | PRN
  Administered 2018-12-13: 50 ug/min via INTRAVENOUS

## 2018-12-13 MED ORDER — DEXAMETHASONE SODIUM PHOSPHATE 10 MG/ML IJ SOLN
INTRAMUSCULAR | Status: AC
  Filled 2018-12-13: qty 1

## 2018-12-13 MED ORDER — HYDROCODONE-ACETAMINOPHEN 5-325 MG PO TABS: 1.0000 | ORAL_TABLET | Freq: Four times a day (QID) | ORAL | 0 refills | Status: DC | PRN

## 2018-12-13 SURGICAL SUPPLY — 35 items
ARMBAND PINK RESTRICT EXTREMIT (MISCELLANEOUS) ×3 IMPLANT
CANISTER SUCT 3000ML PPV (MISCELLANEOUS) ×3 IMPLANT
CLIP VESOCCLUDE MED 6/CT (CLIP) ×3 IMPLANT
CLIP VESOCCLUDE SM WIDE 6/CT (CLIP) ×3 IMPLANT
COVER PROBE W GEL 5X96 (DRAPES) IMPLANT
COVER WAND RF STERILE (DRAPES) IMPLANT
DERMABOND ADVANCED (GAUZE/BANDAGES/DRESSINGS) ×2
DERMABOND ADVANCED .7 DNX12 (GAUZE/BANDAGES/DRESSINGS) ×1 IMPLANT
ELECT REM PT RETURN 9FT ADLT (ELECTROSURGICAL) ×3
ELECTRODE REM PT RTRN 9FT ADLT (ELECTROSURGICAL) ×1 IMPLANT
GLOVE BIO SURGEON STRL SZ 6.5 (GLOVE) ×2 IMPLANT
GLOVE BIO SURGEON STRL SZ7.5 (GLOVE) ×9 IMPLANT
GLOVE BIO SURGEONS STRL SZ 6.5 (GLOVE) ×1
GLOVE BIOGEL PI IND STRL 6.5 (GLOVE) ×1 IMPLANT
GLOVE BIOGEL PI IND STRL 7.0 (GLOVE) ×1 IMPLANT
GLOVE BIOGEL PI INDICATOR 6.5 (GLOVE) ×2
GLOVE BIOGEL PI INDICATOR 7.0 (GLOVE) ×2
GLOVE ECLIPSE 6.5 STRL STRAW (GLOVE) ×3 IMPLANT
GOWN STRL REUS W/ TWL LRG LVL3 (GOWN DISPOSABLE) ×2 IMPLANT
GOWN STRL REUS W/ TWL XL LVL3 (GOWN DISPOSABLE) ×2 IMPLANT
GOWN STRL REUS W/TWL LRG LVL3 (GOWN DISPOSABLE) ×4
GOWN STRL REUS W/TWL XL LVL3 (GOWN DISPOSABLE) ×4
INSERT FOGARTY SM (MISCELLANEOUS) IMPLANT
KIT BASIN OR (CUSTOM PROCEDURE TRAY) ×3 IMPLANT
KIT TURNOVER KIT B (KITS) ×3 IMPLANT
NS IRRIG 1000ML POUR BTL (IV SOLUTION) ×3 IMPLANT
PACK CV ACCESS (CUSTOM PROCEDURE TRAY) ×3 IMPLANT
PAD ARMBOARD 7.5X6 YLW CONV (MISCELLANEOUS) ×6 IMPLANT
SUT MNCRL AB 4-0 PS2 18 (SUTURE) ×3 IMPLANT
SUT PROLENE 6 0 BV (SUTURE) ×6 IMPLANT
SUT VIC AB 3-0 SH 27 (SUTURE) ×2
SUT VIC AB 3-0 SH 27X BRD (SUTURE) ×1 IMPLANT
TOWEL GREEN STERILE (TOWEL DISPOSABLE) ×3 IMPLANT
UNDERPAD 30X30 (UNDERPADS AND DIAPERS) ×3 IMPLANT
WATER STERILE IRR 1000ML POUR (IV SOLUTION) ×3 IMPLANT

## 2018-12-13 NOTE — Anesthesia Postprocedure Evaluation (Signed)
Anesthesia Post Note  Patient: Lewanda Dettore  Procedure(s) Performed: ARTERIOVENOUS (AV) FISTULA CREATION LEFT UPPER ARM (Left )     Patient location during evaluation: PACU Anesthesia Type: Regional and MAC Level of consciousness: awake and alert Pain management: pain level controlled Vital Signs Assessment: post-procedure vital signs reviewed and stable Respiratory status: spontaneous breathing, nonlabored ventilation and respiratory function stable Cardiovascular status: blood pressure returned to baseline and stable Postop Assessment: no apparent nausea or vomiting Anesthetic complications: no    Last Vitals:  Vitals:   12/13/18 0930 12/13/18 0936  BP:  115/64  Pulse: (!) 59 67  Resp: 15 16  Temp:  36.5 C  SpO2: 97% 100%    Last Pain:  Vitals:   12/13/18 0937  TempSrc:   PainSc: 0-No pain                 Pervis Hocking

## 2018-12-13 NOTE — Anesthesia Procedure Notes (Signed)
Procedure Name: MAC Date/Time: 12/13/2018 7:34 AM Performed by: Janene Harvey, CRNA Pre-anesthesia Checklist: Patient identified, Emergency Drugs available, Suction available and Patient being monitored Patient Re-evaluated:Patient Re-evaluated prior to induction Oxygen Delivery Method: Nasal cannula Dental Injury: Teeth and Oropharynx as per pre-operative assessment

## 2018-12-13 NOTE — Op Note (Signed)
    Patient name: Nellene Courtois MRN: [id number] DOB: 1960-04-12 Sex: female  12/13/2018 Pre-operative Diagnosis: End-stage renal disease Post-operative diagnosis:  Same Surgeon:  Erlene Quan C. Donzetta Matters, MD Assistant: Arlee Muslim, PA Procedure Performed:   Left arm brachiocephalic AV fistula creation  Indications: 58 year old female with end-stage renal disease currently dialyzing via catheter.  She is indicated for left arm fistula versus graft.  Findings: Patient had adequate block placed in preop.  There was suitable appearing basilic and cephalic vein at the level of the antecubitum and above.  The vein was easily dilated to 4 mm.  At completion was a strong thrill and a palpable radial pulse the wrist.  She could still have basilic vein fistula creation above the antecubitum if necessary in the future.   Procedure:  The patient was identified in the holding area and taken to the operating room where she is placed upon operative table and MAC anesthesia induced.  Preoperative block of been placed.  Antibiotics were minister and timeout called.  Block was tested was adequate.  Ultrasound was used to identify the veins.  We made a transverse incision below the antecubitum.  We divided branch between clips and ties and marked the cephalic vein for orientation.  We dissected through the deep fascia placed Vesseloops around the brachial artery.  We then transected the vein distally tied it off.  We serially dilated up to 4 mm flushed with heparinized saline and clamped.  The artery was clamped distally proximally opened longitudinally flushed with heparinized saline both directions.  Vein was then sewn inside with 6-0 Prolene suture.  Prior to completion of the anastomosis we allowed flushing all direction.  Upon completion there is a very strong thrill.  The vein did dilate nicely.  We mobilized the tissue for the vein to see nicely.  There is a good thrill in the vein confirmed with Doppler and a palpable  radial pulse the wrist also confirmed with Doppler.  We irrigated the wound thoroughly closed in layers with Vicryl and Monocryl.  Dermabond placed at the level of the skin.  She was awakened from anesthesia having tolerated the procedure well without immediate complication.  All counts were correct at completion.  EBL: 20 cc   Jenayah Antu C. Donzetta Matters, MD [facility] Office: [phone] Pager: [phone]

## 2018-12-13 NOTE — H&P (Signed)
   History and Physical Update  The patient was interviewed and re-examined.  The patient's previous History and Physical has been reviewed and is unchanged from recent office visit. Plan for left arm dialysis access today in OR.   Rebekah Melroy C. Donzetta Matters, MD [facility] Office: [phone] Pager: [phone]   12/13/2018, 7:13 AM

## 2018-12-13 NOTE — Anesthesia Procedure Notes (Signed)
Anesthesia Regional Block: Supraclavicular block   Pre-Anesthetic Checklist: ,, timeout performed, Correct Patient, Correct Site, Correct Laterality, Correct Procedure, Correct Position, site marked, Risks and benefits discussed,  Surgical consent,  Pre-op evaluation,  At surgeon's request and post-op pain management  Laterality: Left  Prep: Maximum Sterile Barrier Precautions used, chloraprep       Needles:  Injection technique: Single-shot  Needle Type: Echogenic Stimulator Needle     Needle Length: 9cm  Needle Gauge: 22     Additional Needles:   Procedures:,,,, ultrasound used (permanent image in chart),,,,  Narrative:  Start time: 12/13/2018 7:10 AM End time: 12/13/2018 7:15 AM Injection made incrementally with aspirations every 5 mL.  Performed by: Personally  Anesthesiologist: Pervis Hocking, DO  Additional Notes: Monitors applied. No increased pain on injection. No increased resistance to injection. Injection made in 5cc increments. Good needle visualization. Patient tolerated procedure well.

## 2018-12-13 NOTE — Discharge Instructions (Signed)
° °  [facility] ° °Discharge Instructions ° °AV Fistula or Graft Surgery for Dialysis Access ° °Please refer to the following instructions for your post-procedure care. Your surgeon or physician assistant will discuss any changes with you. ° °Activity ° °You may drive the day following your surgery, if you are comfortable and no longer taking prescription pain medication. Resume full activity as the soreness in your incision resolves. ° °Bathing/Showering ° °You may shower after you go home. Keep your incision dry for 48 hours. Do not soak in a bathtub, hot tub, or swim until the incision heals completely. You may not shower if you have a hemodialysis catheter. ° °Incision Care ° °Clean your incision with mild soap and water after 48 hours. Pat the area dry with a clean towel. You do not need a bandage unless otherwise instructed. Do not apply any ointments or creams to your incision. You may have skin glue on your incision. Do not peel it off. It will come off on its own in about one week. Your arm may swell a bit after surgery. To reduce swelling use pillows to elevate your arm so it is above your heart. Your doctor will tell you if you need to lightly wrap your arm with an ACE bandage. ° °Diet ° °Resume your normal diet. There are not special food restrictions following this procedure. In order to heal from your surgery, it is CRITICAL to get adequate nutrition. Your body requires vitamins, minerals, and protein. Vegetables are the best source of vitamins and minerals. Vegetables also provide the perfect balance of protein. Processed food has little nutritional value, so try to avoid this. ° °Medications ° °Resume taking all of your medications. If your incision is causing pain, you may take over-the counter pain relievers such as acetaminophen (Tylenol). If you were prescribed a stronger pain medication, please be aware these medications can cause nausea and constipation. Prevent  nausea by taking the medication with a snack or meal. Avoid constipation by drinking plenty of fluids and eating foods with high amount of fiber, such as fruits, vegetables, and grains. Do not take Tylenol if you are taking prescription pain medications. ° ° ° ° °Follow up °Your surgeon may want to see you in the office following your access surgery. If so, this will be arranged at the time of your surgery. ° °Please call us immediately for any of the following conditions: ° °Increased pain, redness, drainage (pus) from your incision site °Fever of 101 degrees or higher °Severe or worsening pain at your incision site °Hand pain or numbness. ° °Reduce your risk of vascular disease: ° °Stop smoking. If you would like help, call [facility] at 1-800-QUIT-NOW (1-800-784-8669) or [facility] at 336-586-4000 ° °Manage your cholesterol °Maintain a desired weight °Control your diabetes °Keep your blood pressure down ° °Dialysis ° °It will take several weeks to several months for your new dialysis access to be ready for use. Your surgeon will determine when it is OK to use it. Your nephrologist will continue to direct your dialysis. You can continue to use your Permcath until your new access is ready for use. ° °If you have any questions, please call the office at [phone]. ° °

## 2018-12-13 NOTE — Transfer of Care (Signed)
Immediate Anesthesia Transfer of Care Note  Patient: Rebekah Johnson  Procedure(s) Performed: ARTERIOVENOUS (AV) FISTULA CREATION LEFT UPPER ARM (Left )  Patient Location: PACU  Anesthesia Type:MAC  Level of Consciousness: drowsy  Airway & Oxygen Therapy: Patient Spontanous Breathing and Patient connected to nasal cannula oxygen  Post-op Assessment: Report given to RN and Post -op Vital signs reviewed and stable  Post vital signs: Reviewed  Last Vitals:  Vitals Value Taken Time  BP 108/55 12/13/18 0842  Temp    Pulse    Resp 15 12/13/18 0844  SpO2    Vitals shown include unvalidated device data.  Last Pain:  Vitals:   12/13/18 0636  TempSrc: Oral  PainSc: 0-No pain      Patients Stated Pain Goal: 2 (51/83/43 7357)  Complications: No apparent anesthesia complications

## 2018-12-14 ENCOUNTER — Encounter (HOSPITAL_COMMUNITY): Payer: Self-pay | Admitting: Vascular Surgery

## 2019-01-05 ENCOUNTER — Ambulatory Visit (INDEPENDENT_AMBULATORY_CARE_PROVIDER_SITE_OTHER): Payer: Medicare Other | Admitting: Orthopedic Surgery

## 2019-01-05 ENCOUNTER — Encounter: Payer: Self-pay | Admitting: Orthopedic Surgery

## 2019-01-05 ENCOUNTER — Other Ambulatory Visit: Payer: Self-pay

## 2019-01-05 DIAGNOSIS — B351 Tinea unguium: Secondary | ICD-10-CM

## 2019-01-05 DIAGNOSIS — L97421 Non-pressure chronic ulcer of left heel and midfoot limited to breakdown of skin: Secondary | ICD-10-CM | POA: Diagnosis not present

## 2019-01-05 DIAGNOSIS — E1142 Type 2 diabetes mellitus with diabetic polyneuropathy: Secondary | ICD-10-CM | POA: Diagnosis not present

## 2019-01-05 MED ORDER — MUPIROCIN 2 % EX OINT: 1.0000 "application " | TOPICAL_OINTMENT | Freq: Two times a day (BID) | CUTANEOUS | 3 refills | Status: DC

## 2019-01-05 MED ORDER — DOXYCYCLINE HYCLATE 100 MG PO TABS: 100.0000 mg | ORAL_TABLET | Freq: Two times a day (BID) | ORAL | 0 refills | Status: DC

## 2019-01-05 NOTE — Progress Notes (Signed)
Office Visit Note   Patient: Rebekah Johnson           Date of Birth: Oct 14, 1960           MRN: [id number] Visit Date: 01/05/2019              Requested by: No referring provider defined for this encounter. PCP: Dallas Schimke, MD  Chief Complaint  Patient presents with  . Left Foot - Pain      HPI: Patient is a 58 year old woman with diabetic insensate neuropathy status post transmetatarsal amputation on the left.  Patient states she has developed increasing pain recently that wakes her up at night.  She states she has had no drainage no redness no cellulitis she also complains of painful onychomycotic nails on the right foot.  Patient states that she has started dialysis Monday Wednesday Friday and states she feels much better after starting dialysis.  Assessment & Plan: Visit Diagnoses:  1. Diabetic polyneuropathy associated with type 2 diabetes mellitus ([place])   2. Lower limb ulcer, heel or midfoot, left, limited to breakdown of skin ([place])   3. Onychomycosis     Plan: Will start patient on doxycycline and Bactroban.  Will follow up in 2 weeks.  Follow-Up Instructions: Return in about 2 weeks (around 01/19/2019).   Ortho Exam  Patient is alert, oriented, no adenopathy, well-dressed, normal affect, normal respiratory effort. Examination patient has thickened discolored onychomycotic nails on the right foot she is unable to safely trim them on her own due to her diabetic insensate neuropathy and nails were trimmed x5 without complications.  Examination of the left foot patient has a stable transmetatarsal amputation her foot is plantigrade but she has a very large callus over the first ray.  Patient states this is the area that is painful and wakes her up at night.  After informed consent a 10 blade knife was used to breed the skin and soft tissue back to healthy viable granulation tissue the wound after debridement was 10 mm in diameter 3 mm deep.  Patient did have a small drop  of purulence within the wound and this is obviously the source of her pain.  Bactroban and a Band-Aid was applied we will start her on doxycycline and Bactroban.  Imaging: No results found. No images are attached to the encounter.  Labs: Lab Results  Component Value Date   HGBA1C 5.6 11/01/2018   HGBA1C 5.5 08/31/2018   HGBA1C 6.9 (A) 03/03/2018   ESRSEDRATE 57 (H) 07/13/2015   CRP 0.7 07/13/2015   LABURIC 5.7 07/13/2015   REPTSTATUS 01/04/2017 FINAL 01/04/2017   CULT (A) 01/01/2017    VIRIDANS STREPTOCOCCUS THE SIGNIFICANCE OF ISOLATING THIS ORGANISM FROM A SINGLE SET OF BLOOD CULTURES WHEN MULTIPLE SETS ARE DRAWN IS UNCERTAIN. PLEASE NOTIFY THE MICROBIOLOGY DEPARTMENT WITHIN ONE WEEK IF SPECIATION AND SENSITIVITIES ARE REQUIRED. Performed at [facility], [place] [address] [place], [place] [address]      Lab Results  Component Value Date   ALBUMIN 3.9 04/22/2018   ALBUMIN 4.0 10/07/2017   ALBUMIN 3.3 (L) 07/20/2017   LABURIC 5.7 07/13/2015    Lab Results  Component Value Date   MG 1.7 01/01/2017   MG 1.7 09/16/2016   MG 1.7 09/14/2016   No results found for: VD25OH  No results found for: PREALBUMIN CBC EXTENDED Latest Ref Rng & Units 12/13/2018 04/22/2018 07/20/2017  WBC 4.0 - 10.5 K/uL - 11.3(H) 8.4  RBC 3.87 - 5.11 MIL/uL -  4.91 3.98  HGB 12.0 - 15.0 g/dL 14.6 13.4 10.8(L)  HCT 36.0 - 46.0 % 43.0 42.9 32.9(L)  PLT 150 - 400 K/uL - 239 229  NEUTROABS 1.7 - 7.7 K/uL - 8.1(H) 5.9  LYMPHSABS 0.7 - 4.0 K/uL - 2.1 1.6     There is no height or weight on file to calculate BMI.  Orders:  No orders of the defined types were placed in this encounter.  No orders of the defined types were placed in this encounter.    Procedures: No procedures performed  Clinical Data: No additional findings.  ROS:  All other systems negative, except as noted in the HPI. Review of Systems  Objective: Vital Signs: There were no vitals taken for this visit.  Specialty  Comments:  No specialty comments available.  PMFS History: Patient Active Problem List   Diagnosis Date Noted  . Multinodular goiter 08/31/2018  . Cardiac arrest ([place]) 11/22/2017  . DOE (dyspnea on exertion) 09/01/2017  . COPD GOLD  0 08/31/2017  . Diabetic polyneuropathy associated with type 2 diabetes mellitus ([place]) 08/19/2017  . NSTEMI (non-ST elevated myocardial infarction) ([place]) 01/01/2017  . Chest pain 01/01/2017  . Tobacco abuse   . PVD (peripheral vascular disease) ([place])   . Proteinuria   . Obesity   . Normocytic anemia   . Hypertension   . High cholesterol   . Foot amputation status   . Diabetes mellitus with neuropathy ([place])   . CKD (chronic kidney disease), stage IV ([place])   . Chronic diastolic CHF (congestive heart failure) ([place])   . CAD (coronary artery disease) 10/27/2016  . Chronic kidney disease 09/23/2016  . Acute congestive heart failure ([place])   . Hypertensive heart disease with heart failure ([place])   . Chronic combined systolic and diastolic heart failure ([place]) 09/14/2016  . Onychomycosis 12/24/2015  . Status post transmetatarsal amputation of foot, left ([place]) 09/06/2015  . Diabetic foot ulcer ([place]) 07/13/2015  . Cellulitis 07/12/2015  . HLD (hyperlipidemia) 09/02/2012  . Peripheral nerve disease 09/02/2012  . Type 2 diabetes mellitus with peripheral angiopathy ([place]) 09/02/2012  . Hypertriglyceridemia 07/28/2012  . Peripheral vascular disease ([place]) 07/28/2012  . Avitaminosis D 07/28/2012  . Essential (primary) hypertension 06/10/2012  . History of biliary T-tube placement 06/10/2012  . Leg pain 06/10/2012  . Current tobacco use 06/10/2012  . Atrophic vaginitis 06/07/2012  . Type 2 diabetes mellitus ([place]) 06/07/2012   Past Medical History:  Diagnosis Date  . Acute congestive heart failure ([place])   . Acute diastolic CHF (congestive heart failure) ([place]) 09/14/2016  . Atrophic vaginitis 06/07/2012  . Avitaminosis D 07/28/2012  . CAD (coronary artery  disease)    a. NSTEM 09/2016: cath showing severe diffuse disease of the RCA, ramus and Cx with mild-mod disease of LAD; PCI would require multiple stents and significant contrast usage thus medical therapy recommended.  . Cellulitis 07/12/2015  . Chest pain 01/01/2017  . Chronic diastolic CHF (congestive heart failure) ([place])   . Chronic kidney disease 09/23/2016  . CKD (chronic kidney disease), stage IV ([place])   . Constipation   . COPD GOLD  0 08/31/2017   Quit smoking 07/17/17 - Spirometry 09/01/2017  FEV1 1.23 (58%)  Ratio 57 with mild curvature p no rx - 09/01/2017  After extensive coaching inhaler device,  effectiveness =    75% with elipta with cough provoked  - PFT's  10/14/2017  FEV1 1.69 (68 % ) ratio 84  p no % improvement from saba p  nothing prior to study with DLCO  50 % corrects to 76  % for alv volume   - 10/14/2017  Walked RA x 3 laps @ 1  . Current tobacco use 06/10/2012  . Diabetes mellitus with neuropathy ([place])   . Diabetic foot ulcer ([place]) 07/13/2015  . Diabetic polyneuropathy associated with type 2 diabetes mellitus ([place]) 08/19/2017  . DOE (dyspnea on exertion) 09/01/2017   09/01/2017  Walked RA x 3 laps @ 185 ft each stopped due to  End of study, nl to mod fast  pace, no  desat   But stopped to rest x 2  Spirometry 09/01/2017  FEV1 1.23 (58%)  Ratio 57  > trial of anoro    . Dyspnea    with exertion  . ESRD (end stage renal disease) ([place])    MWF - [facility]  . Essential (primary) hypertension 06/10/2012  . Foot amputation status    a. h/o left foot transmetatarsal amputation  . Headache    Migraines  . High cholesterol   . History of biliary T-tube placement 06/10/2012   Patient unaware   . History of cardiac arrest   . HLD (hyperlipidemia) 09/02/2012   Overview:  ICD-10 cut over    . Hypertension    a. patent renal arteries by PV angio 08/2015. b. heavy proteinuria 09/2016 ? nephrotic.  [name] Hypertensive heart disease with heart failure ([place])   . Hypertriglyceridemia 07/28/2012  . Leg  pain 06/10/2012  . Normocytic anemia   . NSTEMI (non-ST elevated myocardial infarction) ([place]) 01/01/2017  . Obesity   . Onychomycosis 12/24/2015  . Peripheral nerve disease 09/02/2012  . Peripheral vascular disease ([place]) 07/28/2012  . Proteinuria   . PVD (peripheral vascular disease) ([place])    a. status post left SFA and popliteal stent and left SFA PTA with drug coated balloon in 08/2015.  . Status post transmetatarsal amputation of foot, left ([place]) 09/06/2015  . Tobacco abuse   . Type 2 diabetes mellitus ([place]) 06/07/2012  . Type 2 diabetes mellitus with peripheral angiopathy ([place]) 09/02/2012    Family History  Problem Relation Age of Onset  . Diabetes Mother   . Hypertension Father     Past Surgical History:  Procedure Laterality Date  . ABDOMINAL HYSTERECTOMY    . AMPUTATION Left 09/06/2015   Procedure: LEFT TRANSMETATARSAL AMPUTATTION;  Surgeon: Newt Minion, MD;  Location: [facility];  Service: Orthopedics;  Laterality: Left;  . AV FISTULA PLACEMENT Left 12/13/2018   Procedure: ARTERIOVENOUS (AV) FISTULA CREATION LEFT UPPER ARM;  Surgeon: Waynetta Sandy, MD;  Location: [facility];  Service: Vascular;  Laterality: Left;  . COLONOSCOPY     polyps x 1  . LEFT HEART CATH AND CORONARY ANGIOGRAPHY N/A 09/17/2016   Procedure: LEFT HEART CATH AND CORONARY ANGIOGRAPHY;  Surgeon: Jettie Booze, MD;  Location: [facility];  Service: Cardiovascular;  Laterality: N/A;  . PERIPHERAL VASCULAR CATHETERIZATION N/A 08/16/2015   Procedure: Abdominal Aortogram;  Surgeon: Elam Dutch, MD;  Location: [facility];  Service: Cardiovascular;  Laterality: N/A;  . PERIPHERAL VASCULAR CATHETERIZATION Bilateral 08/16/2015   Procedure: Lower Extremity Angiography;  Surgeon: Elam Dutch, MD;  Location: [facility];  Service: Cardiovascular;  Laterality: Bilateral;  . PERIPHERAL VASCULAR CATHETERIZATION Left 08/16/2015   Procedure: Peripheral Vascular Intervention;  Surgeon: Elam Dutch, MD;  Location: [facility];  Service: Cardiovascular;  Laterality: Left;  SFA STENT X 2   Social History  Occupational History  . Not on file  Tobacco Use  . Smoking status: Former Smoker    Packs/day: 1.00    Years: 42.00    Pack years: 42.00    Types: Cigarettes    Quit date: 07/17/2017    Years since quitting: 1.4  . Smokeless tobacco: Never Used  Substance and Sexual Activity  . Alcohol use: Yes    Alcohol/week: 1.0 - 2.0 standard drinks    Types: 1 - 2 Glasses of wine per week    Comment: on social occassions  . Drug use: Yes    Types: Marijuana    Comment: Last time 12/12/2018 "every chance I get"  . Sexual activity: Not on file

## 2019-01-26 ENCOUNTER — Telehealth (HOSPITAL_COMMUNITY): Payer: Self-pay

## 2019-01-26 ENCOUNTER — Other Ambulatory Visit: Payer: Self-pay

## 2019-01-26 DIAGNOSIS — N186 End stage renal disease: Secondary | ICD-10-CM

## 2019-01-26 NOTE — Telephone Encounter (Signed)

## 2019-01-26 NOTE — Progress Notes (Signed)
POST OPERATIVE OFFICE NOTE    CC:  F/u for surgery  HPI:  This is a 59 y.o. female who is s/p Left arm brachiocephalic AV fistula creation.  He denise pain , loss of motor, or loss of sensation in the left UE.  She is on HD via right TDC.    No Known Allergies  Current Outpatient Medications  Medication Sig Dispense Refill  . acetaminophen (TYLENOL) 500 MG tablet Take 1,000 mg by mouth every 6 (six) hours as needed for moderate pain or headache.    [name] amLODipine (NORVASC) 5 MG tablet Take 5 mg by mouth daily.    . Ascorbic Acid (VITAMIN C) 1000 MG tablet Take 1,000 mg by mouth daily.    [name] aspirin 81 MG EC tablet Take 1 tablet (81 mg total) by mouth daily. Reported on 07/13/2015 90 tablet 0  . atorvastatin (LIPITOR) 80 MG tablet Take 1 tablet (80 mg total) by mouth daily. 90 tablet 2  . Blood Glucose Monitoring Suppl (ACCU-CHEK AVIVA) device Use as instructed three times daily. 1 each 0  . carvedilol (COREG) 25 MG tablet Take 1 tablet (25 mg total) by mouth 2 (two) times daily. 180 tablet 2  . cholecalciferol (VITAMIN D3) 25 MCG (1000 UT) tablet Take 1,000 Units by mouth daily.    . clopidogrel (PLAVIX) 75 MG tablet Take 1 tablet (75 mg total) by mouth daily. 90 tablet 1  . docusate sodium (COLACE) 100 MG capsule Take 200 mg by mouth 2 (two) times daily.     [name] doxycycline (VIBRA-TABS) 100 MG tablet Take 1 tablet (100 mg total) by mouth 2 (two) times daily. 20 tablet 0  . furosemide (LASIX) 80 MG tablet Take 80 mg by mouth 2 (two) times daily.    [name] gabapentin (NEURONTIN) 300 MG capsule Take 1 capsule (300 mg total) by mouth 3 (three) times daily. 90 capsule 3  . glipiZIDE (GLUCOTROL) 5 MG tablet Take 0.5 tablets (2.5 mg total) by mouth daily before breakfast. (Patient taking differently: Take 5 mg by mouth daily before breakfast. ) 30 tablet 3  . glucose blood (ACCU-CHEK AVIVA) test strip Use as instructed three times daily before meals. 100 each 12  . hydrALAZINE (APRESOLINE) 50 MG tablet  Take 50 mg by mouth 3 (three) times daily.    [name] HYDROcodone-acetaminophen (NORCO) 5-325 MG tablet Take 1 tablet by mouth every 6 (six) hours as needed for moderate pain. 10 tablet 0  . isosorbide mononitrate (IMDUR) 30 MG 24 hr tablet Take 60 mg by mouth daily.    Elmore Guise Devices (ACCU-CHEK SOFTCLIX) lancets Use as instructed three times daily before meals. 1 each 5  . latanoprost (XALATAN) 0.005 % ophthalmic solution Place 1 drop into both eyes at bedtime.     . mupirocin ointment (BACTROBAN) 2 % Apply 1 application topically 2 (two) times daily. Apply to the affected area 2 times a day 22 g 3  . nitroGLYCERIN (NITROSTAT) 0.4 MG SL tablet Place 1 tablet (0.4 mg total) under the tongue every 5 (five) minutes as needed for chest pain. 25 tablet 3  . Omega-3 Fatty Acids (FISH OIL) 1000 MG CAPS Take 1,000 mg by mouth daily.     . ranolazine (RANEXA) 500 MG 12 hr tablet Take 1 tablet (500 mg total) by mouth 2 (two) times daily. 180 tablet 3  . silver sulfADIAZINE (SILVADENE) 1 % cream Apply 1 application topically daily. Apply to affected area daily plus dry dressing (Patient taking differently: Apply  1 application topically daily as needed (wound care). ) 400 g 3  . sitaGLIPtin (JANUVIA) 25 MG tablet Take 1 tablet (25 mg total) by mouth daily. 30 tablet 3   No current facility-administered medications for this visit.     ROS:  See HPI  Physical Exam: Findings: +--------------------+----------+-----------------+--------+ AVF                 PSV (cm/s)Flow Vol (mL/min)Comments +--------------------+----------+-----------------+--------+ Native artery inflow   435          1246                +--------------------+----------+-----------------+--------+ AVF Anastomosis        326                              +--------------------+----------+-----------------+--------+    +------------+----------+-------------+----------+----------------+ OUTFLOW VEINPSV (cm/s)Diameter  (cm)Depth (cm)    Describe     +------------+----------+-------------+----------+----------------+ Prox UA        155        0.47        0.84                    +------------+----------+-------------+----------+----------------+ Mid UA                    0.53        0.39                    +------------+----------+-------------+----------+----------------+ Dist UA        139        0.64        0.34   competing branch +------------+----------+-------------+----------+----------------+ AC Fossa       206        1.00        0.14                    +------------+----------+-------------+----------+----------------+    Summary: Patent arteriovenous fistula.    Incision:  Well healed AC incision left UE Extremities:  Sensation and motor intact equal B UE Lungs: non labored breathing   Assessment/Plan:  This is a 59 y.o. female who is s/p:Left arm brachiocephalic AV fistula creation  Functioning left AV fistula.  The diameter is small at this stage.  I have asked her to exercise it to help increase blood flow and diameter.  She will f/u in 4 weeks for repeat duplex.  There is a competing branch at the distal fistula that may need ligation in the future pending f/u duplex.        Roxy Horseman PA-C [facility] [phone]  Clinic MD:  Donzetta Matters

## 2019-01-27 ENCOUNTER — Ambulatory Visit (INDEPENDENT_AMBULATORY_CARE_PROVIDER_SITE_OTHER): Payer: Self-pay | Admitting: Physician Assistant

## 2019-01-27 ENCOUNTER — Other Ambulatory Visit: Payer: Self-pay

## 2019-01-27 ENCOUNTER — Ambulatory Visit (HOSPITAL_COMMUNITY)
Admission: RE | Admit: 2019-01-27 | Discharge: 2019-01-27 | Disposition: A | Discharge status: Home / Self Care | Payer: Medicare Other | Source: Ambulatory Visit | Attending: Vascular Surgery | Admitting: Vascular Surgery

## 2019-01-27 ENCOUNTER — Ambulatory Visit (HOSPITAL_COMMUNITY): Payer: Medicare Other

## 2019-01-27 VITALS — BP 137/66 | HR 73 | Temp 97.0°F | Resp 16 | Ht 64.0 in | Wt 167.0 lb

## 2019-01-27 DIAGNOSIS — N186 End stage renal disease: Secondary | ICD-10-CM | POA: Diagnosis present

## 2019-01-30 ENCOUNTER — Other Ambulatory Visit: Payer: Self-pay | Admitting: *Deleted

## 2019-01-30 DIAGNOSIS — N186 End stage renal disease: Secondary | ICD-10-CM

## 2019-01-31 ENCOUNTER — Other Ambulatory Visit: Payer: Self-pay

## 2019-02-02 ENCOUNTER — Ambulatory Visit (INDEPENDENT_AMBULATORY_CARE_PROVIDER_SITE_OTHER): Payer: Medicare Other | Admitting: Endocrinology

## 2019-02-02 ENCOUNTER — Other Ambulatory Visit: Payer: Self-pay

## 2019-02-02 ENCOUNTER — Encounter: Payer: Self-pay | Admitting: Endocrinology

## 2019-02-02 ENCOUNTER — Encounter: Payer: Self-pay | Admitting: Orthopedic Surgery

## 2019-02-02 ENCOUNTER — Ambulatory Visit (INDEPENDENT_AMBULATORY_CARE_PROVIDER_SITE_OTHER): Payer: Medicare Other | Admitting: Orthopedic Surgery

## 2019-02-02 VITALS — BP 122/60 | HR 84 | Ht 64.0 in | Wt 166.2 lb

## 2019-02-02 VITALS — Ht 64.0 in | Wt 167.0 lb

## 2019-02-02 DIAGNOSIS — E042 Nontoxic multinodular goiter: Secondary | ICD-10-CM | POA: Diagnosis not present

## 2019-02-02 DIAGNOSIS — M79672 Pain in left foot: Secondary | ICD-10-CM

## 2019-02-02 DIAGNOSIS — E1100 Type 2 diabetes mellitus with hyperosmolarity without nonketotic hyperglycemic-hyperosmolar coma (NKHHC): Secondary | ICD-10-CM

## 2019-02-02 LAB — POCT GLYCOSYLATED HEMOGLOBIN (HGB A1C): Hemoglobin A1C: 6.9 % — AB (ref 4.0–5.6)

## 2019-02-02 MED ORDER — DOXYCYCLINE HYCLATE 100 MG PO TABS: 100.0000 mg | ORAL_TABLET | Freq: Two times a day (BID) | ORAL | 0 refills | Status: DC

## 2019-02-02 NOTE — Progress Notes (Signed)
Subjective:    Patient ID: Rebekah Johnson, female    DOB: 1960/04/14, 59 y.o.   MRN: [id number]  HPI Pt returns for f/u of diabetes mellitus: DM type: 2 Dx'ed: 6269 Complications: polyneuropathy, PAD, CAD, ESRD (on HD), and foot ulcer.   Therapy: 2 oral meds GDM: G0 DKA: never Severe hypoglycemia: never Pancreatitis: never Pancreatic imaging: normal on 2011 CT Other: she has never been on insulin; fructosamine is c/w A1c approx 1% higher than A1c itself.  Interval history: she brings a record of her cbg's which I have reviewed today.  cbg varies from 58-165.  pt states she feels well in general.  Pt says she seldom takes Glipizide or Januvia.    Pt also hs MNG (dx'ed 2011; bx is 2014 was Beth cat 2; f/u US in 2019 was unchanged; she is euthyroid off any rx).   Past Medical History:  Diagnosis Date  . Acute congestive heart failure ([place])   . Acute diastolic CHF (congestive heart failure) ([place]) 09/14/2016  . Atrophic vaginitis 06/07/2012  . Avitaminosis D 07/28/2012  . CAD (coronary artery disease)    a. NSTEM 09/2016: cath showing severe diffuse disease of the RCA, ramus and Cx with mild-mod disease of LAD; PCI would require multiple stents and significant contrast usage thus medical therapy recommended.  . Cellulitis 07/12/2015  . Chest pain 01/01/2017  . Chronic diastolic CHF (congestive heart failure) ([place])   . Chronic kidney disease 09/23/2016  . CKD (chronic kidney disease), stage IV ([place])   . Constipation   . COPD GOLD  0 08/31/2017   Quit smoking 07/17/17 - Spirometry 09/01/2017  FEV1 1.23 (58%)  Ratio 57 with mild curvature p no rx - 09/01/2017  After extensive coaching inhaler device,  effectiveness =    75% with elipta with cough provoked  - PFT's  10/14/2017  FEV1 1.69 (68 % ) ratio 84  p no % improvement from saba p nothing prior to study with DLCO  50 % corrects to 76  % for alv volume   - 10/14/2017  Walked RA x 3 laps @ 1  . Current tobacco use 06/10/2012  . Diabetes mellitus  with neuropathy ([place])   . Diabetic foot ulcer ([place]) 07/13/2015  . Diabetic polyneuropathy associated with type 2 diabetes mellitus ([place]) 08/19/2017  . DOE (dyspnea on exertion) 09/01/2017   09/01/2017  Walked RA x 3 laps @ 185 ft each stopped due to  End of study, nl to mod fast  pace, no  desat   But stopped to rest x 2  Spirometry 09/01/2017  FEV1 1.23 (58%)  Ratio 57  > trial of anoro    . Dyspnea    with exertion  . ESRD (end stage renal disease) ([place])    MWF - [facility]  . Essential (primary) hypertension 06/10/2012  . Foot amputation status    a. h/o left foot transmetatarsal amputation  . Headache    Migraines  . High cholesterol   . History of biliary T-tube placement 06/10/2012   Patient unaware   . History of cardiac arrest   . HLD (hyperlipidemia) 09/02/2012   Overview:  ICD-10 cut over    . Hypertension    a. patent renal arteries by PV angio 08/2015. b. heavy proteinuria 09/2016 ? nephrotic.  [name] Hypertensive heart disease with heart failure ([place])   . Hypertriglyceridemia 07/28/2012  . Leg pain 06/10/2012  . Normocytic anemia   . NSTEMI (non-ST elevated myocardial infarction) ([place]) 01/01/2017  .  Obesity   . Onychomycosis 12/24/2015  . Peripheral nerve disease 09/02/2012  . Peripheral vascular disease ([place]) 07/28/2012  . Proteinuria   . PVD (peripheral vascular disease) ([place])    a. status post left SFA and popliteal stent and left SFA PTA with drug coated balloon in 08/2015.  . Status post transmetatarsal amputation of foot, left ([place]) 09/06/2015  . Tobacco abuse   . Type 2 diabetes mellitus ([place]) 06/07/2012  . Type 2 diabetes mellitus with peripheral angiopathy ([place]) 09/02/2012    Past Surgical History:  Procedure Laterality Date  . ABDOMINAL HYSTERECTOMY    . AMPUTATION Left 09/06/2015   Procedure: LEFT TRANSMETATARSAL AMPUTATTION;  Surgeon: Newt Minion, MD;  Location: [facility];  Service: Orthopedics;  Laterality: Left;  . AV FISTULA PLACEMENT Left 12/13/2018   Procedure:  ARTERIOVENOUS (AV) FISTULA CREATION LEFT UPPER ARM;  Surgeon: Waynetta Sandy, MD;  Location: [facility];  Service: Vascular;  Laterality: Left;  . COLONOSCOPY     polyps x 1  . LEFT HEART CATH AND CORONARY ANGIOGRAPHY N/A 09/17/2016   Procedure: LEFT HEART CATH AND CORONARY ANGIOGRAPHY;  Surgeon: Jettie Booze, MD;  Location: [facility];  Service: Cardiovascular;  Laterality: N/A;  . PERIPHERAL VASCULAR CATHETERIZATION N/A 08/16/2015   Procedure: Abdominal Aortogram;  Surgeon: Elam Dutch, MD;  Location: [facility];  Service: Cardiovascular;  Laterality: N/A;  . PERIPHERAL VASCULAR CATHETERIZATION Bilateral 08/16/2015   Procedure: Lower Extremity Angiography;  Surgeon: Elam Dutch, MD;  Location: [facility];  Service: Cardiovascular;  Laterality: Bilateral;  . PERIPHERAL VASCULAR CATHETERIZATION Left 08/16/2015   Procedure: Peripheral Vascular Intervention;  Surgeon: Elam Dutch, MD;  Location: [facility];  Service: Cardiovascular;  Laterality: Left;  SFA STENT X 2    Social History   Socioeconomic History  . Marital status: Married    Spouse name: Not on file  . Number of children: Not on file  . Years of education: Not on file  . Highest education level: Not on file  Occupational History  . Not on file  Tobacco Use  . Smoking status: Former Smoker    Packs/day: 1.00    Years: 42.00    Pack years: 42.00    Types: Cigarettes    Quit date: 07/17/2017    Years since quitting: 1.5  . Smokeless tobacco: Never Used  Substance and Sexual Activity  . Alcohol use: Yes    Alcohol/week: 1.0 - 2.0 standard drinks    Types: 1 - 2 Glasses of wine per week    Comment: on social occassions  . Drug use: Yes    Types: Marijuana    Comment: Last time 12/12/2018 "every chance I get"  . Sexual activity: Not on file  Other Topics Concern  . Not on file  Social History Narrative  . Not on file   Social Determinants of Health   Financial  Resource Strain:   . Difficulty of Paying Living Expenses: Not on file  Food Insecurity:   . Worried About Charity fundraiser in the Last Year: Not on file  . Ran Out of Food in the Last Year: Not on file  Transportation Needs:   . Lack of Transportation (Medical): Not on file  . Lack of Transportation (Non-Medical): Not on file  Physical Activity:   . Days of Exercise per Week: Not on file  . Minutes of Exercise per Session: Not on file  Stress:   .  Feeling of Stress : Not on file  Social Connections:   . Frequency of Communication with Friends and Family: Not on file  . Frequency of Social Gatherings with Friends and Family: Not on file  . Attends Religious Services: Not on file  . Active Member of Clubs or Organizations: Not on file  . Attends Archivist Meetings: Not on file  . Marital Status: Not on file  Intimate Partner Violence:   . Fear of Current or Ex-Partner: Not on file  . Emotionally Abused: Not on file  . Physically Abused: Not on file  . Sexually Abused: Not on file    Current Outpatient Medications on File Prior to Visit  Medication Sig Dispense Refill  . acetaminophen (TYLENOL) 500 MG tablet Take 1,000 mg by mouth every 6 (six) hours as needed for moderate pain or headache.    [name] amLODipine (NORVASC) 5 MG tablet Take 5 mg by mouth daily.    . Ascorbic Acid (VITAMIN C) 1000 MG tablet Take 1,000 mg by mouth daily.    [name] aspirin 81 MG EC tablet Take 1 tablet (81 mg total) by mouth daily. Reported on 07/13/2015 90 tablet 0  . atorvastatin (LIPITOR) 80 MG tablet Take 1 tablet (80 mg total) by mouth daily. 90 tablet 2  . Blood Glucose Monitoring Suppl (ACCU-CHEK AVIVA) device Use as instructed three times daily. 1 each 0  . carvedilol (COREG) 25 MG tablet Take 1 tablet (25 mg total) by mouth 2 (two) times daily. 180 tablet 2  . cholecalciferol (VITAMIN D3) 25 MCG (1000 UT) tablet Take 1,000 Units by mouth daily.    . clopidogrel (PLAVIX) 75 MG tablet Take 1  tablet (75 mg total) by mouth daily. 90 tablet 1  . docusate sodium (COLACE) 100 MG capsule Take 200 mg by mouth 2 (two) times daily.     . furosemide (LASIX) 80 MG tablet Take 80 mg by mouth 2 (two) times daily.    [name] gabapentin (NEURONTIN) 300 MG capsule Take 1 capsule (300 mg total) by mouth 3 (three) times daily. 90 capsule 3  . glipiZIDE (GLUCOTROL) 5 MG tablet Take 0.5 tablets (2.5 mg total) by mouth daily before breakfast. (Patient taking differently: Take 5 mg by mouth daily before breakfast. ) 30 tablet 3  . glucose blood (ACCU-CHEK AVIVA) test strip Use as instructed three times daily before meals. 100 each 12  . hydrALAZINE (APRESOLINE) 50 MG tablet Take 50 mg by mouth 3 (three) times daily.    [name] HYDROcodone-acetaminophen (NORCO) 5-325 MG tablet Take 1 tablet by mouth every 6 (six) hours as needed for moderate pain. 10 tablet 0  . isosorbide mononitrate (IMDUR) 30 MG 24 hr tablet Take 60 mg by mouth daily.    Elmore Guise Devices (ACCU-CHEK SOFTCLIX) lancets Use as instructed three times daily before meals. 1 each 5  . latanoprost (XALATAN) 0.005 % ophthalmic solution Place 1 drop into both eyes at bedtime.     . mupirocin ointment (BACTROBAN) 2 % Apply 1 application topically 2 (two) times daily. Apply to the affected area 2 times a day 22 g 3  . Omega-3 Fatty Acids (FISH OIL) 1000 MG CAPS Take 1,000 mg by mouth daily.     . ranolazine (RANEXA) 500 MG 12 hr tablet Take 1 tablet (500 mg total) by mouth 2 (two) times daily. 180 tablet 3  . silver sulfADIAZINE (SILVADENE) 1 % cream Apply 1 application topically daily. Apply to affected area daily plus dry dressing (  Patient taking differently: Apply 1 application topically daily as needed (wound care). ) 400 g 3  . sitaGLIPtin (JANUVIA) 25 MG tablet Take 1 tablet (25 mg total) by mouth daily. 30 tablet 3  . nitroGLYCERIN (NITROSTAT) 0.4 MG SL tablet Place 1 tablet (0.4 mg total) under the tongue every 5 (five) minutes as needed for chest pain. 25  tablet 3   No current facility-administered medications on file prior to visit.    No Known Allergies  Family History  Problem Relation Age of Onset  . Diabetes Mother   . Hypertension Father     BP 122/60 (BP Location: Right Arm, Patient Position: Sitting, Cuff Size: Large)   Pulse 84   Ht 5\' 4"  (1.626 m)   Wt 166 lb 3.2 oz (75.4 kg)   SpO2 99%   BMI 28.53 kg/m    Review of Systems She denies hypoglycemia.      Objective:   Physical Exam VITAL SIGNS:  See vs page GENERAL: no distress Pulses: dorsalis pedis intact bilat.   MSK: no deformity of the right foot.  Left TMA CV: no leg edema Skin:  shallow ulcer (sees wound care).  normal color and temp on the feet. Neuro: sensation is intact to touch on the feet, but decreased from normal Ext: there is bilateral onychomycosis of the right foot toenails.  Lab Results  Component Value Date   HGBA1C 6.9 (A) 02/02/2019   Lab Results  Component Value Date   CREATININE 3.40 (H) 12/13/2018   BUN 24 (H) 12/13/2018   NA 137 12/13/2018   K 3.3 (L) 12/13/2018   CL 96 (L) 12/13/2018   CO2 24 04/22/2018       Assessment & Plan:  MNG: due for recheck Type 2 DM: apparently well-controlled ESRD: check fructosamine to verify A1c  Patient Instructions  check your blood sugar once a day.  vary the time of day when you check, between before the 3 meals, and at bedtime.  also check if you have symptoms of your blood sugar being too high or too low.  please keep a record of the readings and bring it to your next appointment here (or you can bring the meter itself).  You can write it on any piece of paper.  please call us sooner if your blood sugar goes below 70, or if you have a lot of readings over 200.    A different type of diabetes blood test is requested for you today.  We'll let you know about the results.   If it is high, i'l prescribe for you to take the Januvia every day Let's recheck the ultrasound.  you will receive a phone  call, about a day and time for an appointment.   Please come back for a follow-up appointment in 2 months.

## 2019-02-02 NOTE — Progress Notes (Signed)
Office Visit Note   Patient: Rebekah Johnson           Date of Birth: [birth year]           MRN: [id number] Visit Date: 02/02/2019              Requested by: No referring provider defined for this encounter. PCP: Dallas Schimke, MD  Chief Complaint  Patient presents with  . Left Foot - Follow-up, Pain  . Right Foot - Follow-up      HPI: Patient is here in follow-up for her foot.  She is status post transmetatarsal amputation.  She did develop an ulcer at the medial end of the amputation site.  She has been treating this with Bactroban and doxycycline she has seen improvement  Assessment & Plan: Visit Diagnoses: No diagnosis found.  Plan: She will take the doxycycline for 1 more round.  She will continue to do dressing changes with a Band-Aid and just a hint of Silvadene or Bactroban follow-up in 2 weeks  Follow-Up Instructions: No follow-ups on file.   Ortho Exam  Patient is alert, oriented, no adenopathy, well-dressed, normal affect, normal respiratory effort. Focused examination demonstrates ulcer is healing small bit of drainage that does not have a foul odor there is some skin maceration pulses are palpable no surrounding cellulitis  Imaging: No results found. No images are attached to the encounter.  Labs: Lab Results  Component Value Date   HGBA1C 5.6 11/01/2018   HGBA1C 5.5 08/31/2018   HGBA1C 6.9 (A) 03/03/2018   ESRSEDRATE 57 (H) 07/13/2015   CRP 0.7 07/13/2015   LABURIC 5.7 07/13/2015   REPTSTATUS 01/04/2017 FINAL 01/04/2017   CULT (A) 01/01/2017    VIRIDANS STREPTOCOCCUS THE SIGNIFICANCE OF ISOLATING THIS ORGANISM FROM A SINGLE SET OF BLOOD CULTURES WHEN MULTIPLE SETS ARE DRAWN IS UNCERTAIN. PLEASE NOTIFY THE MICROBIOLOGY DEPARTMENT WITHIN ONE WEEK IF SPECIATION AND SENSITIVITIES ARE REQUIRED. Performed at [facility], [place] [address] [place], [place] [address]      Lab Results  Component Value Date   ALBUMIN 3.9 04/22/2018   ALBUMIN 4.0  10/07/2017   ALBUMIN 3.3 (L) 07/20/2017   LABURIC 5.7 07/13/2015    Lab Results  Component Value Date   MG 1.7 01/01/2017   MG 1.7 09/16/2016   MG 1.7 09/14/2016   No results found for: VD25OH  No results found for: PREALBUMIN CBC EXTENDED Latest Ref Rng & Units 12/13/2018 04/22/2018 07/20/2017  WBC 4.0 - 10.5 K/uL - 11.3(H) 8.4  RBC 3.87 - 5.11 MIL/uL - 4.91 3.98  HGB 12.0 - 15.0 g/dL 14.6 13.4 10.8(L)  HCT 36.0 - 46.0 % 43.0 42.9 32.9(L)  PLT 150 - 400 K/uL - 239 229  NEUTROABS 1.7 - 7.7 K/uL - 8.1(H) 5.9  LYMPHSABS 0.7 - 4.0 K/uL - 2.1 1.6     Body mass index is 28.67 kg/m.  Orders:  No orders of the defined types were placed in this encounter.  Meds ordered this encounter  Medications  . doxycycline (VIBRA-TABS) 100 MG tablet    Sig: Take 1 tablet (100 mg total) by mouth 2 (two) times daily.    Dispense:  20 tablet    Refill:  0     Procedures: No procedures performed  Clinical Data: No additional findings.  ROS:  All other systems negative, except as noted in the HPI. Review of Systems  Objective: Vital Signs: Ht 5\' 4"  (1.626 m)   Wt 167 lb (75.8 kg)  BMI 28.67 kg/m   Specialty Comments:  No specialty comments available.  PMFS History: Patient Active Problem List   Diagnosis Date Noted  . Multinodular goiter 08/31/2018  . Cardiac arrest ([place]) 11/22/2017  . DOE (dyspnea on exertion) 09/01/2017  . COPD GOLD  0 08/31/2017  . Diabetic polyneuropathy associated with type 2 diabetes mellitus ([place]) 08/19/2017  . NSTEMI (non-ST elevated myocardial infarction) ([place]) 01/01/2017  . Chest pain 01/01/2017  . Tobacco abuse   . PVD (peripheral vascular disease) ([place])   . Proteinuria   . Obesity   . Normocytic anemia   . Hypertension   . High cholesterol   . Foot amputation status   . Diabetes mellitus with neuropathy ([place])   . CKD (chronic kidney disease), stage IV ([place])   . Chronic diastolic CHF (congestive heart failure) ([place])   . CAD (coronary  artery disease) 10/27/2016  . Chronic kidney disease 09/23/2016  . Acute congestive heart failure ([place])   . Hypertensive heart disease with heart failure ([place])   . Chronic combined systolic and diastolic heart failure ([place]) 09/14/2016  . Onychomycosis 12/24/2015  . Status post transmetatarsal amputation of foot, left ([place]) 09/06/2015  . Diabetic foot ulcer ([place]) 07/13/2015  . Cellulitis 07/12/2015  . HLD (hyperlipidemia) 09/02/2012  . Peripheral nerve disease 09/02/2012  . Type 2 diabetes mellitus with peripheral angiopathy ([place]) 09/02/2012  . Hypertriglyceridemia 07/28/2012  . Peripheral vascular disease ([place]) 07/28/2012  . Avitaminosis D 07/28/2012  . Essential (primary) hypertension 06/10/2012  . History of biliary T-tube placement 06/10/2012  . Leg pain 06/10/2012  . Current tobacco use 06/10/2012  . Atrophic vaginitis 06/07/2012  . Type 2 diabetes mellitus ([place]) 06/07/2012   Past Medical History:  Diagnosis Date  . Acute congestive heart failure ([place])   . Acute diastolic CHF (congestive heart failure) ([place]) 09/14/2016  . Atrophic vaginitis 06/07/2012  . Avitaminosis D 07/28/2012  . CAD (coronary artery disease)    a. NSTEM 09/2016: cath showing severe diffuse disease of the RCA, ramus and Cx with mild-mod disease of LAD; PCI would require multiple stents and significant contrast usage thus medical therapy recommended.  . Cellulitis 07/12/2015  . Chest pain 01/01/2017  . Chronic diastolic CHF (congestive heart failure) ([place])   . Chronic kidney disease 09/23/2016  . CKD (chronic kidney disease), stage IV ([place])   . Constipation   . COPD GOLD  0 08/31/2017   Quit smoking 07/17/17 - Spirometry 09/01/2017  FEV1 1.23 (58%)  Ratio 57 with mild curvature p no rx - 09/01/2017  After extensive coaching inhaler device,  effectiveness =    75% with elipta with cough provoked  - PFT's  10/14/2017  FEV1 1.69 (68 % ) ratio 84  p no % improvement from saba p nothing prior to study with DLCO  50 %  corrects to 76  % for alv volume   - 10/14/2017  Walked RA x 3 laps @ 1  . Current tobacco use 06/10/2012  . Diabetes mellitus with neuropathy ([place])   . Diabetic foot ulcer ([place]) 07/13/2015  . Diabetic polyneuropathy associated with type 2 diabetes mellitus ([place]) 08/19/2017  . DOE (dyspnea on exertion) 09/01/2017   09/01/2017  Walked RA x 3 laps @ 185 ft each stopped due to  End of study, nl to mod fast  pace, no  desat   But stopped to rest x 2  Spirometry 09/01/2017  FEV1 1.23 (58%)  Ratio 57  > trial of anoro    .  Dyspnea    with exertion  . ESRD (end stage renal disease) ([place])    MWF - [facility]  . Essential (primary) hypertension 06/10/2012  . Foot amputation status    a. h/o left foot transmetatarsal amputation  . Headache    Migraines  . High cholesterol   . History of biliary T-tube placement 06/10/2012   Patient unaware   . History of cardiac arrest   . HLD (hyperlipidemia) 09/02/2012   Overview:  ICD-10 cut over    . Hypertension    a. patent renal arteries by PV angio 08/2015. b. heavy proteinuria 09/2016 ? nephrotic.  [name] Hypertensive heart disease with heart failure ([place])   . Hypertriglyceridemia 07/28/2012  . Leg pain 06/10/2012  . Normocytic anemia   . NSTEMI (non-ST elevated myocardial infarction) ([place]) 01/01/2017  . Obesity   . Onychomycosis 12/24/2015  . Peripheral nerve disease 09/02/2012  . Peripheral vascular disease ([place]) 07/28/2012  . Proteinuria   . PVD (peripheral vascular disease) ([place])    a. status post left SFA and popliteal stent and left SFA PTA with drug coated balloon in 08/2015.  . Status post transmetatarsal amputation of foot, left ([place]) 09/06/2015  . Tobacco abuse   . Type 2 diabetes mellitus ([place]) 06/07/2012  . Type 2 diabetes mellitus with peripheral angiopathy ([place]) 09/02/2012    Family History  Problem Relation Age of Onset  . Diabetes Mother   . Hypertension Father     Past Surgical History:  Procedure Laterality Date  . ABDOMINAL HYSTERECTOMY    .  AMPUTATION Left 09/06/2015   Procedure: LEFT TRANSMETATARSAL AMPUTATTION;  Surgeon: Newt Minion, MD;  Location: [facility];  Service: Orthopedics;  Laterality: Left;  . AV FISTULA PLACEMENT Left 12/13/2018   Procedure: ARTERIOVENOUS (AV) FISTULA CREATION LEFT UPPER ARM;  Surgeon: Waynetta Sandy, MD;  Location: [facility];  Service: Vascular;  Laterality: Left;  . COLONOSCOPY     polyps x 1  . LEFT HEART CATH AND CORONARY ANGIOGRAPHY N/A 09/17/2016   Procedure: LEFT HEART CATH AND CORONARY ANGIOGRAPHY;  Surgeon: Jettie Booze, MD;  Location: [facility];  Service: Cardiovascular;  Laterality: N/A;  . PERIPHERAL VASCULAR CATHETERIZATION N/A 08/16/2015   Procedure: Abdominal Aortogram;  Surgeon: Elam Dutch, MD;  Location: [facility];  Service: Cardiovascular;  Laterality: N/A;  . PERIPHERAL VASCULAR CATHETERIZATION Bilateral 08/16/2015   Procedure: Lower Extremity Angiography;  Surgeon: Elam Dutch, MD;  Location: [facility];  Service: Cardiovascular;  Laterality: Bilateral;  . PERIPHERAL VASCULAR CATHETERIZATION Left 08/16/2015   Procedure: Peripheral Vascular Intervention;  Surgeon: Elam Dutch, MD;  Location: [facility];  Service: Cardiovascular;  Laterality: Left;  SFA STENT X 2   Social History   Occupational History  . Not on file  Tobacco Use  . Smoking status: Former Smoker    Packs/day: 1.00    Years: 42.00    Pack years: 42.00    Types: Cigarettes    Quit date: 07/17/2017    Years since quitting: 1.5  . Smokeless tobacco: Never Used  Substance and Sexual Activity  . Alcohol use: Yes    Alcohol/week: 1.0 - 2.0 standard drinks    Types: 1 - 2 Glasses of wine per week    Comment: on social occassions  . Drug use: Yes    Types: Marijuana    Comment: Last time 12/12/2018 "every chance I get"  . Sexual activity: Not on file

## 2019-02-02 NOTE — Patient Instructions (Addendum)
check your blood sugar once a day.  vary the time of day when you check, between before the 3 meals, and at bedtime.  also check if you have symptoms of your blood sugar being too high or too low.  please keep a record of the readings and bring it to your next appointment here (or you can bring the meter itself).  You can write it on any piece of paper.  please call us sooner if your blood sugar goes below 70, or if you have a lot of readings over 200.    A different type of diabetes blood test is requested for you today.  We'll let you know about the results.   If it is high, i'l prescribe for you to take the Januvia every day Let's recheck the ultrasound.  you will receive a phone call, about a day and time for an appointment.   Please come back for a follow-up appointment in 2 months.

## 2019-02-05 LAB — FRUCTOSAMINE: Fructosamine: 347 umol/L — ABNORMAL HIGH (ref 205–285)

## 2019-02-06 ENCOUNTER — Other Ambulatory Visit: Payer: Self-pay | Admitting: Endocrinology

## 2019-02-06 DIAGNOSIS — E042 Nontoxic multinodular goiter: Secondary | ICD-10-CM

## 2019-02-08 ENCOUNTER — Telehealth: Payer: Self-pay | Admitting: Oncology

## 2019-02-08 NOTE — Telephone Encounter (Signed)
Rescheduled per 2/1 sch msg. Called and spoke with pt, confirmed 4/22 and 4/29 appts

## 2019-02-13 NOTE — Progress Notes (Signed)
Cardiology Office Note    Date:  02/14/2019   ID:  Rebekah Johnson, DOB [birth year], MRN [id number]  PCP:  Rebekah Schimke, MD  Cardiologist: Rebekah Dawley, MD EPS: None  No chief complaint on file.   History of Present Illness:  Rebekah Johnson is a 59 y.o. female  with history of CAD status post NSTEMI 09/2016 with severe diffuse disease of the RCA, ramus and circumflex with mild to moderate disease of the LAD.  Medical therapy was recommended because multiple stents and significant and contrast would have to be used.  Also has chronic diastolic CHF, CKD stage III, DM with neuropathy and foot amputation, hypertension, HLD, PVD status post left SFA and popliteal stent and left SFA PTA with drug-coated balloon 08/2015, tobacco abuse.   Readmitted with NSTEMI and acute on chronic CHF 12/2016 managed medically.  .   NSTEMI 08/2017 [place] [facility] in [place] in setting of severe respiratory distress/sepsis pneumonia and hypertensive emergency. 2D echo 09/02/2017 showed normal LV systolic function 50 to 01% with severe hypokinesis and scarring of the basal mid inferior lateral myocardium, grade 2 DD with moderate MR.   Patient was in [place] [facility] again 09/07/17 with pulmonary edema/repiratory failure on ventilator for 4 days. Cardiac arrest with asystole in ER successful resuscitation hypothermia protocol.  They were not sure whether or not she had an arrhythmia that preclude occluded this.  Echo severe HK and AK inf/lat wall with MR, septic shock, renal failure.   An echocardiogram performed 09/08/17 showed borderline reduced left ventricular systolic function with EF of 49% and severely hypokinetic to akinetic inferior and lateral wall of the left ventricle with mitral regurgitation.   Notes say she would need a cardiac catheterization once she stabilized but patient was adamant about being [facility] at [facility].30-day monitor 11/2017 showed normal sinus rhythm with no pauses or arrhythmias.   Normal monitor   Patient was last seen in our office 04/12/2018 at which time she was waiting for kidney transplant but it was put on hold because of Covid and she had to undergo dialysis.  Patient says that she can't get a kidney transplant until we open her heart blockages. Wasn't done before because of renal disease but now she's on dialysis. Currently denies any chest pain, shortness of breath, dizziness, or presyncope.  Past Medical History:  Diagnosis Date  . Acute congestive heart failure ([place])   . Acute diastolic CHF (congestive heart failure) ([place]) 09/14/2016  . Atrophic vaginitis 06/07/2012  . Avitaminosis D 07/28/2012  . CAD (coronary artery disease)    a. NSTEM 09/2016: cath showing severe diffuse disease of the RCA, ramus and Cx with mild-mod disease of LAD; PCI would require multiple stents and significant contrast usage thus medical therapy recommended.  . Cellulitis 07/12/2015  . Chest pain 01/01/2017  . Chronic diastolic CHF (congestive heart failure) ([place])   . Chronic kidney disease 09/23/2016  . CKD (chronic kidney disease), stage IV ([place])   . Constipation   . COPD GOLD  0 08/31/2017   Quit smoking 07/17/17 - Spirometry 09/01/2017  FEV1 1.23 (58%)  Ratio 57 with mild curvature p no rx - 09/01/2017  After extensive coaching inhaler device,  effectiveness =    75% with elipta with cough provoked  - PFT's  10/14/2017  FEV1 1.69 (68 % ) ratio 84  p no % improvement from saba p nothing prior to study with DLCO  50 % corrects to 76  % for alv volume   -  10/14/2017  Walked RA x 3 laps @ 1  . Current tobacco use 06/10/2012  . Diabetes mellitus with neuropathy ([place])   . Diabetic foot ulcer ([place]) 07/13/2015  . Diabetic polyneuropathy associated with type 2 diabetes mellitus ([place]) 08/19/2017  . DOE (dyspnea on exertion) 09/01/2017   09/01/2017  Walked RA x 3 laps @ 185 ft each stopped due to  End of study, nl to mod fast  pace, no  desat   But stopped to rest x 2  Spirometry 09/01/2017  FEV1 1.23 (58%)   Ratio 57  > trial of anoro    . Dyspnea    with exertion  . ESRD (end stage renal disease) ([place])    MWF - [place] [facility]  . Essential (primary) hypertension 06/10/2012  . Foot amputation status    a. h/o left foot transmetatarsal amputation  . Headache    Migraines  . High cholesterol   . History of biliary T-tube placement 06/10/2012   Patient unaware   . History of cardiac arrest   . HLD (hyperlipidemia) 09/02/2012   Overview:  ICD-10 cut over    . Hypertension    a. patent renal arteries by PV angio 08/2015. b. heavy proteinuria 09/2016 ? nephrotic.  [name] Hypertensive heart disease with heart failure ([place])   . Hypertriglyceridemia 07/28/2012  . Leg pain 06/10/2012  . Normocytic anemia   . NSTEMI (non-ST elevated myocardial infarction) ([place]) 01/01/2017  . Obesity   . Onychomycosis 12/24/2015  . Peripheral nerve disease 09/02/2012  . Peripheral vascular disease ([place]) 07/28/2012  . Proteinuria   . PVD (peripheral vascular disease) ([place])    a. status post left SFA and popliteal stent and left SFA PTA with drug coated balloon in 08/2015.  . Status post transmetatarsal amputation of foot, left ([place]) 09/06/2015  . Tobacco abuse   . Type 2 diabetes mellitus ([place]) 06/07/2012  . Type 2 diabetes mellitus with peripheral angiopathy ([place]) 09/02/2012    Past Surgical History:  Procedure Laterality Date  . ABDOMINAL HYSTERECTOMY    . AMPUTATION Left 09/06/2015   Procedure: LEFT TRANSMETATARSAL AMPUTATTION;  Surgeon: Rebekah Minion, MD;  Location: [facility];  Service: Orthopedics;  Laterality: Left;  . AV FISTULA PLACEMENT Left 12/13/2018   Procedure: ARTERIOVENOUS (AV) FISTULA CREATION LEFT UPPER ARM;  Surgeon: Rebekah Sandy, MD;  Location: [facility];  Service: Vascular;  Laterality: Left;  . COLONOSCOPY     polyps x 1  . LEFT HEART CATH AND CORONARY ANGIOGRAPHY N/A 09/17/2016   Procedure: LEFT HEART CATH AND CORONARY ANGIOGRAPHY;  Surgeon: Rebekah Booze, MD;  Location: [facility];  Service:  Cardiovascular;  Laterality: N/A;  . PERIPHERAL VASCULAR CATHETERIZATION N/A 08/16/2015   Procedure: Abdominal Aortogram;  Surgeon: Elam Dutch, MD;  Location: [facility];  Service: Cardiovascular;  Laterality: N/A;  . PERIPHERAL VASCULAR CATHETERIZATION Bilateral 08/16/2015   Procedure: Lower Extremity Angiography;  Surgeon: Elam Dutch, MD;  Location: [facility];  Service: Cardiovascular;  Laterality: Bilateral;  . PERIPHERAL VASCULAR CATHETERIZATION Left 08/16/2015   Procedure: Peripheral Vascular Intervention;  Surgeon: Elam Dutch, MD;  Location: [facility];  Service: Cardiovascular;  Laterality: Left;  SFA STENT X 2    Current Medications: Current Meds  Medication Sig  . acetaminophen (TYLENOL) 500 MG tablet Take 1,000 mg by mouth every 6 (six) hours as needed for moderate pain or headache.  [name] amLODipine (NORVASC) 5 MG tablet Take 5 mg by mouth  daily.  . Ascorbic Acid (VITAMIN C) 1000 MG tablet Take 1,000 mg by mouth daily.  [name] aspirin 81 MG EC tablet Take 1 tablet (81 mg total) by mouth daily. Reported on 07/13/2015  . atorvastatin (LIPITOR) 80 MG tablet Take 1 tablet (80 mg total) by mouth daily.  . Blood Glucose Monitoring Suppl (ACCU-CHEK AVIVA) device Use as instructed three times daily. (Patient taking differently: Use as instructed twice times daily.)  . carvedilol (COREG) 25 MG tablet Take 1 tablet (25 mg total) by mouth 2 (two) times daily.  . cholecalciferol (VITAMIN D3) 25 MCG (1000 UT) tablet Take 1,000 Units by mouth daily.  . clopidogrel (PLAVIX) 75 MG tablet Take 1 tablet (75 mg total) by mouth daily.  [name] docusate sodium (COLACE) 100 MG capsule Take 200 mg by mouth 2 (two) times daily.   [name] doxycycline (VIBRA-TABS) 100 MG tablet Take 1 tablet (100 mg total) by mouth 2 (two) times daily.  . furosemide (LASIX) 80 MG tablet Take 80 mg by mouth 2 (two) times daily.  [name] gabapentin (NEURONTIN) 300 MG capsule Take 1 capsule (300 mg total) by mouth 3  (three) times daily.  [name] glipiZIDE (GLUCOTROL) 5 MG tablet Take 0.5 tablets (2.5 mg total) by mouth daily before breakfast. (Patient taking differently: Take 5 mg by mouth daily before breakfast. )  . glucose blood (ACCU-CHEK AVIVA) test strip Use as instructed three times daily before meals.  . hydrALAZINE (APRESOLINE) 50 MG tablet Take 50 mg by mouth 3 (three) times daily.  [name] HYDROcodone-acetaminophen (NORCO) 5-325 MG tablet Take 1 tablet by mouth every 6 (six) hours as needed for moderate pain.  . isosorbide mononitrate (IMDUR) 30 MG 24 hr tablet Take 60 mg by mouth daily.  Elmore Guise Devices (ACCU-CHEK SOFTCLIX) lancets Use as instructed three times daily before meals.  . latanoprost (XALATAN) 0.005 % ophthalmic solution Place 1 drop into both eyes at bedtime.   . nitroGLYCERIN (NITROSTAT) 0.4 MG SL tablet Place 1 tablet (0.4 mg total) under the tongue every 5 (five) minutes as needed for chest pain.  . Omega-3 Fatty Acids (FISH OIL) 1000 MG CAPS Take 1,000 mg by mouth daily.   . ranolazine (RANEXA) 500 MG 12 hr tablet Take 1 tablet (500 mg total) by mouth 2 (two) times daily.  . silver sulfADIAZINE (SILVADENE) 1 % cream Apply 1 application topically daily. Apply to affected area daily plus dry dressing (Patient taking differently: Apply 1 application topically daily as needed (wound care). )  . sitaGLIPtin (JANUVIA) 25 MG tablet Take 1 tablet (25 mg total) by mouth daily.     Allergies:   Patient has no known allergies.   Social History   Socioeconomic History  . Marital status: Married    Spouse name: Not on file  . Number of children: Not on file  . Years of education: Not on file  . Highest education level: Not on file  Occupational History  . Not on file  Tobacco Use  . Smoking status: Former Smoker    Packs/day: 1.00    Years: 42.00    Pack years: 42.00    Types: Cigarettes    Quit date: 07/17/2017    Years since quitting: 1.5  . Smokeless tobacco: Never Used  Substance and  Sexual Activity  . Alcohol use: Yes    Alcohol/week: 1.0 - 2.0 standard drinks    Types: 1 - 2 Glasses of wine per week    Comment: on social occassions  . Drug  use: Yes    Types: Marijuana    Comment: Last time 12/12/2018 "every chance I get"  . Sexual activity: Not on file  Other Topics Concern  . Not on file  Social History Narrative  . Not on file   Social Determinants of Health   Financial Resource Strain:   . Difficulty of Paying Living Expenses: Not on file  Food Insecurity:   . Worried About Charity fundraiser in the Last Year: Not on file  . Ran Out of Food in the Last Year: Not on file  Transportation Needs:   . Lack of Transportation (Medical): Not on file  . Lack of Transportation (Non-Medical): Not on file  Physical Activity:   . Days of Exercise per Week: Not on file  . Minutes of Exercise per Session: Not on file  Stress:   . Feeling of Stress : Not on file  Social Connections:   . Frequency of Communication with Friends and Family: Not on file  . Frequency of Social Gatherings with Friends and Family: Not on file  . Attends Religious Services: Not on file  . Active Member of Clubs or Organizations: Not on file  . Attends Archivist Meetings: Not on file  . Marital Status: Not on file     Family History:  The patient's family history includes Diabetes in her mother; Hypertension in her father.   ROS:   Please see the history of present illness.    ROS All other systems reviewed and are negative.   PHYSICAL EXAM:   VS:  BP 116/60   Pulse 76   Ht 5\' 4"  (1.626 m)   Wt 164 lb 12.8 oz (74.8 kg)   BMI 28.29 kg/m   Physical Exam  GEN: Well nourished, well developed, in no acute distress  Neck: no JVD, carotid bruits, or masses Cardiac:RRR; 1/6 systolic murmur LSB Respiratory:  clear to auscultation bilaterally, normal work of breathing GI: soft, nontender, nondistended, + BS Ext: without cyanosis, clubbing, or edema, Good distal pulses  bilaterally Neuro:  Alert and Oriented x 3 Psych: euthymic mood, full affect  Wt Readings from Last 3 Encounters:  02/14/19 164 lb 12.8 oz (74.8 kg)  02/02/19 166 lb 3.2 oz (75.4 kg)  02/02/19 167 lb (75.8 kg)      Studies/Labs Reviewed:   EKG:  EKG is not ordered today.    Recent Labs: 04/22/2018: ALT 15; Platelet Count 239 08/31/2018: TSH 0.66 12/13/2018: BUN 24; Creatinine, Ser 3.40; Hemoglobin 14.6; Potassium 3.3; Sodium 137   Lipid Panel    Component Value Date/Time   CHOL 119 08/06/2017 0920   TRIG 307 (H) 08/06/2017 0920   HDL 30 (L) 08/06/2017 0920   CHOLHDL 4.0 08/06/2017 0920   CHOLHDL 3.7 09/16/2016 0559   VLDL 33 09/16/2016 0559   LDLCALC 28 08/06/2017 0920    Additional studies/ records that were reviewed today include:   2D echo [facility] 09/08/2017 An echocardiogram 09/08/17 showed borderline reduced left ventricular systolic function with EF of 49% and severely hypokinetic to akinetic inferior and lateral wall of the left ventricle with mitral regurgitation.    2D echo 09/02/2017 Study Conclusions   - Left ventricle: The cavity size was normal. There was mild   concentric hypertrophy. Systolic function was at the lower limits   of normal. The estimated ejection fraction was in the range of   50% to 55%. Severe hypokinesis and scarring of the   basal-midinferolateral myocardium; consistent with infarction  in   the distribution of the left circumflex coronary artery. Features   are consistent with a pseudonormal left ventricular filling   pattern, with concomitant abnormal relaxation and increased   filling pressure (grade 2 diastolic dysfunction). - Mitral valve: There was moderate regurgitation directed   eccentrically and posteriorly. - Left atrium: The atrium was mildly to moderately dilated.   Impressions:   - Compared to September 2018, the MR appears more significant.    30-day monitor 11/11/2017 normal with no arrhythmias        ASSESSMENT:    1. Cardiac arrest ([place])   2. Chronic combined systolic and diastolic CHF (congestive heart failure) ([place])   3. Coronary artery disease involving native coronary artery of native heart without angina pectoris   4. Essential hypertension   5. CKD (chronic kidney disease), stage IV ([place])   6. Hyperlipidemia, unspecified hyperlipidemia type      PLAN:  In order of problems listed above:  Asystole cardiac arrest 09/07/2017 [place] in the setting of pulmonary edema, respiratory failure and question of pneumonia with septic shock.  Patient was on a ventilator for 4 days.  LVEF 49% with severe hypokinesis to akinesis of the inferior and lateral walls of LV with mitral regurgitation.  This was not significantly different from echo 08/2017.  She also had acute renal failure.  They recommended cardiac catheterization at some point.  Patient now on dialysis but  No recent chest pain.  Continue medical therapy for now. She has a letter stating she needs to have heart blockages fixed before she can have a kidney transplant. She will fax this to [place]. F/u with Dr. [name] to discuss.   Chronic systolic and diastolic CHF LVEF 29% at [facility] 09/2017 see above for details. Compensated.   CAD status post NSTEMI 09/2016 with severe diffuse disease in the RCA, ramus and circumflex, with mild to moderate disease in the LAD.  NSTEMI 08/23/2017 [place] in the setting of severe respiratory distress and sepsis secondary to pneumonia and CHF.  No cath because of AKI but now on dialysis. No angina.   Essential hypertension blood pressure controlled   ESRD on HD followed by [place] considered for kidney transplant.    Hyperlipidemia on Lipitor, LDL 28 08/2017 followed by renal/pcp   Tobacco abuse quit cigarette smoking but smokes marijuana 1 joint daily       Medication Adjustments/Labs and Tests  Ordered: Current medicines are reviewed at length with the patient today.  Concerns regarding medicines are outlined above.  Medication changes, Labs and Tests ordered today are listed in the Patient Instructions below. Patient Instructions  Medication Instructions:  Your physician recommends that you continue on your current medications as directed. Please refer to the Current Medication list given to you today.  *If you need a refill on your cardiac medications before your next appointment, please call your pharmacy*  Lab Work: None ordered  If you have labs (blood work) drawn today and your tests are completely normal, you will receive your results only by: [name] MyChart Message (if you have MyChart) OR . A paper copy in the mail If you have any lab test that is abnormal or we need to change your treatment, we will call you to review the results.  Testing/Procedures: None ordered  Follow-Up: Follow up with Dr. [name] on 04/18/19 at 3:40 PM  Other Instructions Please fax letter from Transplant Specialist to [phone] Attn: Dr. [name]  Sumner Boast, PA-C  02/14/2019 11:39 AM    [facility] [address], [place], [place]  [address] Phone: [phone]; Fax: [phone]

## 2019-02-14 ENCOUNTER — Other Ambulatory Visit: Payer: Self-pay

## 2019-02-14 ENCOUNTER — Ambulatory Visit (INDEPENDENT_AMBULATORY_CARE_PROVIDER_SITE_OTHER): Payer: Medicare Other | Admitting: Physician Assistant

## 2019-02-14 ENCOUNTER — Encounter: Payer: Self-pay | Admitting: Physician Assistant

## 2019-02-14 VITALS — BP 116/60 | HR 76 | Ht 64.0 in | Wt 164.8 lb

## 2019-02-14 DIAGNOSIS — N184 Chronic kidney disease, stage 4 (severe): Secondary | ICD-10-CM

## 2019-02-14 DIAGNOSIS — I10 Essential (primary) hypertension: Secondary | ICD-10-CM | POA: Diagnosis not present

## 2019-02-14 DIAGNOSIS — I5042 Chronic combined systolic (congestive) and diastolic (congestive) heart failure: Secondary | ICD-10-CM | POA: Diagnosis not present

## 2019-02-14 DIAGNOSIS — I469 Cardiac arrest, cause unspecified: Secondary | ICD-10-CM

## 2019-02-14 DIAGNOSIS — I2510 Atherosclerotic heart disease of native coronary artery without angina pectoris: Secondary | ICD-10-CM | POA: Diagnosis not present

## 2019-02-14 DIAGNOSIS — E785 Hyperlipidemia, unspecified: Secondary | ICD-10-CM

## 2019-02-14 NOTE — Patient Instructions (Signed)
Medication Instructions:  Your physician recommends that you continue on your current medications as directed. Please refer to the Current Medication list given to you today.  *If you need a refill on your cardiac medications before your next appointment, please call your pharmacy*  Lab Work: None ordered  If you have labs (blood work) drawn today and your tests are completely normal, you will receive your results only by: [name] MyChart Message (if you have MyChart) OR . A paper copy in the mail If you have any lab test that is abnormal or we need to change your treatment, we will call you to review the results.  Testing/Procedures: None ordered  Follow-Up: Follow up with Dr. [name] on 04/18/19 at 3:40 PM  Other Instructions Please fax letter from Transplant Specialist to [phone] Attn: Dr. [name]

## 2019-02-16 ENCOUNTER — Ambulatory Visit (INDEPENDENT_AMBULATORY_CARE_PROVIDER_SITE_OTHER): Payer: Medicare Other | Admitting: Orthopedic Surgery

## 2019-02-16 ENCOUNTER — Encounter: Payer: Self-pay | Admitting: Orthopedic Surgery

## 2019-02-16 ENCOUNTER — Other Ambulatory Visit: Payer: Self-pay

## 2019-02-16 VITALS — Ht 64.0 in | Wt 164.0 lb

## 2019-02-16 DIAGNOSIS — I739 Peripheral vascular disease, unspecified: Secondary | ICD-10-CM

## 2019-02-16 NOTE — Progress Notes (Signed)
Office Visit Note   Patient: Rebekah Johnson           Date of Birth: 1960-10-25           MRN: [id number] Visit Date: 02/16/2019              Requested by: No referring provider defined for this encounter. PCP: Dallas Schimke, MD  Chief Complaint  Patient presents with  . Left Foot - Follow-up  . Right Foot - Follow-up      HPI: This is a pleasant 59 year old woman who is here in follow-up for her left foot.  She is status post left transmetatarsal amputation in 2017.  She had developed a small ulcer at the tip of the amputation stump and has been treating this with Silvadene dressing and doxycycline twice a day she is also requesting to have her nails trimmed on her right foot  Assessment & Plan: Visit Diagnoses: No diagnosis found.  Plan: She is currently hoping for a kidney transplant.  She may follow-up with [place] in 1 month.  She will continue to place a little Silvadene and a dressing over the area.  I do not see any evidence of infection and I do not think she needs a refill on her doxycycline.  Follow-Up Instructions: No follow-ups on file.   Ortho Exam  Patient is alert, oriented, no adenopathy, well-dressed, normal affect, normal respiratory effort. Focused examination of the left amputation stump no cellulitis no erythema no fluctuance.  She has a small half centimeter area of hypertrophic fibrous tissue which was debrided.  There was healthy pink skin beneath and I could not express any drainage.  Also with verbal permission nails were trimmed x4  Imaging: No results found. No images are attached to the encounter.  Labs: Lab Results  Component Value Date   HGBA1C 6.9 (A) 02/02/2019   HGBA1C 5.6 11/01/2018   HGBA1C 5.5 08/31/2018   ESRSEDRATE 57 (H) 07/13/2015   CRP 0.7 07/13/2015   LABURIC 5.7 07/13/2015   REPTSTATUS 01/04/2017 FINAL 01/04/2017   CULT (A) 01/01/2017    VIRIDANS STREPTOCOCCUS THE SIGNIFICANCE OF ISOLATING THIS ORGANISM FROM A SINGLE SET OF  BLOOD CULTURES WHEN MULTIPLE SETS ARE DRAWN IS UNCERTAIN. PLEASE NOTIFY THE MICROBIOLOGY DEPARTMENT WITHIN ONE WEEK IF SPECIATION AND SENSITIVITIES ARE REQUIRED. Performed at [facility], [place] [address], [place], [place] [address]      Lab Results  Component Value Date   ALBUMIN 3.9 04/22/2018   ALBUMIN 4.0 10/07/2017   ALBUMIN 3.3 (L) 07/20/2017   LABURIC 5.7 07/13/2015    Lab Results  Component Value Date   MG 1.7 01/01/2017   MG 1.7 09/16/2016   MG 1.7 09/14/2016   No results found for: VD25OH  No results found for: PREALBUMIN CBC EXTENDED Latest Ref Rng & Units 12/13/2018 04/22/2018 07/20/2017  WBC 4.0 - 10.5 K/uL - 11.3(H) 8.4  RBC 3.87 - 5.11 MIL/uL - 4.91 3.98  HGB 12.0 - 15.0 g/dL 14.6 13.4 10.8(L)  HCT 36.0 - 46.0 % 43.0 42.9 32.9(L)  PLT 150 - 400 K/uL - 239 229  NEUTROABS 1.7 - 7.7 K/uL - 8.1(H) 5.9  LYMPHSABS 0.7 - 4.0 K/uL - 2.1 1.6     Body mass index is 28.15 kg/m.  Orders:  No orders of the defined types were placed in this encounter.  No orders of the defined types were placed in this encounter.    Procedures: No procedures performed  Clinical Data: No additional  findings.  ROS:  All other systems negative, except as noted in the HPI. Review of Systems  Objective: Vital Signs: Ht 5\' 4"  (1.626 m)   Wt 164 lb (74.4 kg)   BMI 28.15 kg/m   Specialty Comments:  No specialty comments available.  PMFS History: Patient Active Problem List   Diagnosis Date Noted  . Multinodular goiter 08/31/2018  . Cardiac arrest ([place]) 11/22/2017  . DOE (dyspnea on exertion) 09/01/2017  . COPD GOLD  0 08/31/2017  . Diabetic polyneuropathy associated with type 2 diabetes mellitus ([place]) 08/19/2017  . NSTEMI (non-ST elevated myocardial infarction) ([place]) 01/01/2017  . Chest pain 01/01/2017  . Tobacco abuse   . PVD (peripheral vascular disease) ([place])   . Proteinuria   . Obesity   . Normocytic anemia   . Hypertension   . High cholesterol   . Foot  amputation status   . Diabetes mellitus with neuropathy ([place])   . CKD (chronic kidney disease), stage IV ([place])   . Chronic diastolic CHF (congestive heart failure) ([place])   . CAD (coronary artery disease) 10/27/2016  . Chronic kidney disease 09/23/2016  . Acute congestive heart failure ([place])   . Hypertensive heart disease with heart failure ([place])   . Chronic combined systolic and diastolic heart failure ([place]) 09/14/2016  . Onychomycosis 12/24/2015  . Status post transmetatarsal amputation of foot, left ([place]) 09/06/2015  . Diabetic foot ulcer ([place]) 07/13/2015  . Cellulitis 07/12/2015  . HLD (hyperlipidemia) 09/02/2012  . Peripheral nerve disease 09/02/2012  . Type 2 diabetes mellitus with peripheral angiopathy ([place]) 09/02/2012  . Hypertriglyceridemia 07/28/2012  . Peripheral vascular disease ([place]) 07/28/2012  . Avitaminosis D 07/28/2012  . Essential (primary) hypertension 06/10/2012  . History of biliary T-tube placement 06/10/2012  . Leg pain 06/10/2012  . Current tobacco use 06/10/2012  . Atrophic vaginitis 06/07/2012  . Type 2 diabetes mellitus ([place]) 06/07/2012   Past Medical History:  Diagnosis Date  . Acute congestive heart failure ([place])   . Acute diastolic CHF (congestive heart failure) ([place]) 09/14/2016  . Atrophic vaginitis 06/07/2012  . Avitaminosis D 07/28/2012  . CAD (coronary artery disease)    a. NSTEM 09/2016: cath showing severe diffuse disease of the RCA, ramus and Cx with mild-mod disease of LAD; PCI would require multiple stents and significant contrast usage thus medical therapy recommended.  . Cellulitis 07/12/2015  . Chest pain 01/01/2017  . Chronic diastolic CHF (congestive heart failure) ([place])   . Chronic kidney disease 09/23/2016  . CKD (chronic kidney disease), stage IV ([place])   . Constipation   . COPD GOLD  0 08/31/2017   Quit smoking 07/17/17 - Spirometry 09/01/2017  FEV1 1.23 (58%)  Ratio 57 with mild curvature p no rx - 09/01/2017  After extensive coaching  inhaler device,  effectiveness =    75% with elipta with cough provoked  - PFT's  10/14/2017  FEV1 1.69 (68 % ) ratio 84  p no % improvement from saba p nothing prior to study with DLCO  50 % corrects to 76  % for alv volume   - 10/14/2017  Walked RA x 3 laps @ 1  . Current tobacco use 06/10/2012  . Diabetes mellitus with neuropathy ([place])   . Diabetic foot ulcer ([place]) 07/13/2015  . Diabetic polyneuropathy associated with type 2 diabetes mellitus ([place]) 08/19/2017  . DOE (dyspnea on exertion) 09/01/2017   09/01/2017  Walked RA x 3 laps @ 185 ft each stopped due to  End of study, nl  to mod fast  pace, no  desat   But stopped to rest x 2  Spirometry 09/01/2017  FEV1 1.23 (58%)  Ratio 57  > trial of anoro    . Dyspnea    with exertion  . ESRD (end stage renal disease) ([place])    MWF - [facility]  . Essential (primary) hypertension 06/10/2012  . Foot amputation status    a. h/o left foot transmetatarsal amputation  . Headache    Migraines  . High cholesterol   . History of biliary T-tube placement 06/10/2012   Patient unaware   . History of cardiac arrest   . HLD (hyperlipidemia) 09/02/2012   Overview:  ICD-10 cut over    . Hypertension    a. patent renal arteries by PV angio 08/2015. b. heavy proteinuria 09/2016 ? nephrotic.  [name] Hypertensive heart disease with heart failure ([place])   . Hypertriglyceridemia 07/28/2012  . Leg pain 06/10/2012  . Normocytic anemia   . NSTEMI (non-ST elevated myocardial infarction) ([place]) 01/01/2017  . Obesity   . Onychomycosis 12/24/2015  . Peripheral nerve disease 09/02/2012  . Peripheral vascular disease ([place]) 07/28/2012  . Proteinuria   . PVD (peripheral vascular disease) ([place])    a. status post left SFA and popliteal stent and left SFA PTA with drug coated balloon in 08/2015.  . Status post transmetatarsal amputation of foot, left ([place]) 09/06/2015  . Tobacco abuse   . Type 2 diabetes mellitus ([place]) 06/07/2012  . Type 2 diabetes mellitus with peripheral angiopathy ([place])  09/02/2012    Family History  Problem Relation Age of Onset  . Diabetes Mother   . Hypertension Father     Past Surgical History:  Procedure Laterality Date  . ABDOMINAL HYSTERECTOMY    . AMPUTATION Left 09/06/2015   Procedure: LEFT TRANSMETATARSAL AMPUTATTION;  Surgeon: Newt Minion, MD;  Location: [facility];  Service: Orthopedics;  Laterality: Left;  . AV FISTULA PLACEMENT Left 12/13/2018   Procedure: ARTERIOVENOUS (AV) FISTULA CREATION LEFT UPPER ARM;  Surgeon: Waynetta Sandy, MD;  Location: [facility];  Service: Vascular;  Laterality: Left;  . COLONOSCOPY     polyps x 1  . LEFT HEART CATH AND CORONARY ANGIOGRAPHY N/A 09/17/2016   Procedure: LEFT HEART CATH AND CORONARY ANGIOGRAPHY;  Surgeon: Jettie Booze, MD;  Location: [facility];  Service: Cardiovascular;  Laterality: N/A;  . PERIPHERAL VASCULAR CATHETERIZATION N/A 08/16/2015   Procedure: Abdominal Aortogram;  Surgeon: Elam Dutch, MD;  Location: [facility];  Service: Cardiovascular;  Laterality: N/A;  . PERIPHERAL VASCULAR CATHETERIZATION Bilateral 08/16/2015   Procedure: Lower Extremity Angiography;  Surgeon: Elam Dutch, MD;  Location: [facility];  Service: Cardiovascular;  Laterality: Bilateral;  . PERIPHERAL VASCULAR CATHETERIZATION Left 08/16/2015   Procedure: Peripheral Vascular Intervention;  Surgeon: Elam Dutch, MD;  Location: [facility];  Service: Cardiovascular;  Laterality: Left;  SFA STENT X 2   Social History   Occupational History  . Not on file  Tobacco Use  . Smoking status: Former Smoker    Packs/day: 1.00    Years: 42.00    Pack years: 42.00    Types: Cigarettes    Quit date: 07/17/2017    Years since quitting: 1.5  . Smokeless tobacco: Never Used  Substance and Sexual Activity  . Alcohol use: Yes    Alcohol/week: 1.0 - 2.0 standard drinks    Types: 1 - 2 Glasses of wine per week  Comment: on social occassions  . Drug use: Yes    Types: Marijuana      Comment: Last time 12/12/2018 "every chance I get"  . Sexual activity: Not on file

## 2019-02-28 ENCOUNTER — Encounter (HOSPITAL_COMMUNITY): Payer: Medicare Other

## 2019-03-02 ENCOUNTER — Telehealth: Payer: Self-pay | Admitting: Cardiology

## 2019-03-02 DIAGNOSIS — I340 Nonrheumatic mitral (valve) insufficiency: Secondary | ICD-10-CM

## 2019-03-02 NOTE — Telephone Encounter (Signed)
Returned call to patient who called to ask if we have received a letter from [facility] stating that she must have her heart valve fixed before she can have kidney transplant. I advised that letter is not in Dr. Francesca Oman mailbox or scanned into patient's chart but that I will forward message to Dr. [name] and her primary nurse for review next week. I advised that she will not receive a call back this week. She states that if letter has not been received next week she will drop off a copy. She thanked me for the call.

## 2019-03-02 NOTE — Telephone Encounter (Signed)
   The pt wanting to discuss regarding her heart valves get it fix prior her kidney transplant and she just wanted to talk to Dr. [name] about it.   Please call

## 2019-03-03 NOTE — Telephone Encounter (Signed)
Her last echo was in 2019, her MR was moderate, can you please oredr a new echocardiogram to reevaluate degree of mitral regurgitation? Thank you, KN

## 2019-03-06 NOTE — Telephone Encounter (Signed)
Called patient and advised her that Dr. [name] would like her to get an echo. I advised that someone from our scheduling department will call her to schedule the test which will be done at our office. She verbalized understanding and agreement.

## 2019-03-07 ENCOUNTER — Other Ambulatory Visit: Payer: Self-pay

## 2019-03-07 ENCOUNTER — Ambulatory Visit (HOSPITAL_COMMUNITY)
Admission: RE | Admit: 2019-03-07 | Discharge: 2019-03-07 | Disposition: A | Discharge status: Home / Self Care | Payer: Medicare Other | Source: Ambulatory Visit | Attending: Surgery | Admitting: Surgery

## 2019-03-07 ENCOUNTER — Ambulatory Visit (INDEPENDENT_AMBULATORY_CARE_PROVIDER_SITE_OTHER): Payer: Self-pay | Admitting: Physician Assistant

## 2019-03-07 VITALS — BP 106/61 | HR 97 | Temp 97.0°F | Resp 18 | Ht 64.0 in | Wt 167.7 lb

## 2019-03-07 DIAGNOSIS — N186 End stage renal disease: Secondary | ICD-10-CM | POA: Insufficient documentation

## 2019-03-07 NOTE — Progress Notes (Signed)
POST OPERATIVE OFFICE NOTE    CC:  F/u for surgery  HPI:  This is a 59 y.o. female who is s/p left arm brachiocephalic AV fistula 94/1/74 by Dr. Donzetta Matters. She is not currently using the fistula. She dialyzes MWF via a Right IJ TDC.  At the time of her last visit on 01/27/19 she was doing well post op and fistula was functioning well but small. She was asked to increase exercises of her upper arm.  She presents today for follow up and duplex. She has been exercising her left arm as much as possible. She denies any steal symptoms. She is not having any pain, numbness, coldness, or loss of motor of left upper extremity   No Known Allergies  Current Outpatient Medications  Medication Sig Dispense Refill  . acetaminophen (TYLENOL) 500 MG tablet Take 1,000 mg by mouth every 6 (six) hours as needed for moderate pain or headache.    [name] amLODipine (NORVASC) 5 MG tablet Take 5 mg by mouth daily.    . Ascorbic Acid (VITAMIN C) 1000 MG tablet Take 1,000 mg by mouth daily.    [name] aspirin 81 MG EC tablet Take 1 tablet (81 mg total) by mouth daily. Reported on 07/13/2015 90 tablet 0  . atorvastatin (LIPITOR) 80 MG tablet Take 1 tablet (80 mg total) by mouth daily. 90 tablet 2  . Blood Glucose Monitoring Suppl (ACCU-CHEK AVIVA) device Use as instructed three times daily. (Patient taking differently: Use as instructed twice times daily.) 1 each 0  . carvedilol (COREG) 25 MG tablet Take 1 tablet (25 mg total) by mouth 2 (two) times daily. 180 tablet 2  . cholecalciferol (VITAMIN D3) 25 MCG (1000 UT) tablet Take 1,000 Units by mouth daily.    . clopidogrel (PLAVIX) 75 MG tablet Take 1 tablet (75 mg total) by mouth daily. 90 tablet 1  . docusate sodium (COLACE) 100 MG capsule Take 200 mg by mouth 2 (two) times daily.     [name] doxycycline (VIBRA-TABS) 100 MG tablet Take 1 tablet (100 mg total) by mouth 2 (two) times daily. 20 tablet 0  . furosemide (LASIX) 80 MG tablet Take 80 mg by mouth 2 (two) times daily.    [name]  gabapentin (NEURONTIN) 300 MG capsule Take 1 capsule (300 mg total) by mouth 3 (three) times daily. 90 capsule 3  . glipiZIDE (GLUCOTROL) 5 MG tablet Take 0.5 tablets (2.5 mg total) by mouth daily before breakfast. (Patient taking differently: Take 5 mg by mouth daily before breakfast. ) 30 tablet 3  . glucose blood (ACCU-CHEK AVIVA) test strip Use as instructed three times daily before meals. 100 each 12  . hydrALAZINE (APRESOLINE) 50 MG tablet Take 50 mg by mouth 3 (three) times daily.    [name] HYDROcodone-acetaminophen (NORCO) 5-325 MG tablet Take 1 tablet by mouth every 6 (six) hours as needed for moderate pain. 10 tablet 0  . isosorbide mononitrate (IMDUR) 30 MG 24 hr tablet Take 60 mg by mouth daily.    Elmore Guise Devices (ACCU-CHEK SOFTCLIX) lancets Use as instructed three times daily before meals. 1 each 5  . latanoprost (XALATAN) 0.005 % ophthalmic solution Place 1 drop into both eyes at bedtime.     . Omega-3 Fatty Acids (FISH OIL) 1000 MG CAPS Take 1,000 mg by mouth daily.     . ranolazine (RANEXA) 500 MG 12 hr tablet Take 1 tablet (500 mg total) by mouth 2 (two) times daily. 180 tablet 3  . silver sulfADIAZINE (SILVADENE)  1 % cream Apply 1 application topically daily. Apply to affected area daily plus dry dressing (Patient taking differently: Apply 1 application topically daily as needed (wound care). ) 400 g 3  . sitaGLIPtin (JANUVIA) 25 MG tablet Take 1 tablet (25 mg total) by mouth daily. 30 tablet 3  . nitroGLYCERIN (NITROSTAT) 0.4 MG SL tablet Place 1 tablet (0.4 mg total) under the tongue every 5 (five) minutes as needed for chest pain. 25 tablet 3   No current facility-administered medications for this visit.     ROS:  Review of Systems  Constitutional: Negative for chills, fever and malaise/fatigue.  HENT: Negative for congestion and sore throat.   Respiratory: Negative for cough and shortness of breath.   Cardiovascular: Negative for chest pain, palpitations and claudication.    Gastrointestinal: Negative for abdominal pain, constipation, nausea and vomiting.  Genitourinary: Negative for dysuria.  Musculoskeletal: Negative for joint pain.  Neurological: Negative for dizziness, weakness and headaches.    Physical Exam:  Vitals:   03/07/19 1331  BP: 106/61  Pulse: 97  Resp: 18  Temp: (!) 97 F (36.1 C)  TempSrc: Temporal  SpO2: 99%  Weight: 167 lb 11.2 oz (76.1 kg)  Height: 5\' 4"  (1.626 m)   General: well appearing, well nourished Cardiac: regular rate and rhythm Lungs: clear to auscultation bilaterally, non labored Incision:  Left AC incision well healed Extremities:  2+ radial and ulnar pulses, left hand warm. Motor and sensation intact. 5/5 grip strength. Palpable thrill and good bruit in left AV fistula. Easily palpable at the [facility] however becomes deep more proximally in the arm after about 3 cm from the [facility] Neuro: Alert and oriented   Non- invasive studies: 03/07/19 Left upper extremity Dialysis Access Duplex Findings:  +--------------------+----------+-----------------+--------+  AVF         PSV (cm/s)Flow Vol (mL/min)Comments  +--------------------+----------+-----------------+--------+  Native artery inflow  256      623          +--------------------+----------+-----------------+--------+  AVF Anastomosis     349                 +--------------------+----------+-----------------+--------+     +------------+----------+-------------+----------+----------------+  OUTFLOW VEINPSV (cm/s)Diameter (cm)Depth (cm)  Describe    +------------+----------+-------------+----------+----------------+  Shoulder    130    0.58     1.03            +------------+----------+-------------+----------+----------------+  Prox UA     154    0.59     1.00            +------------+----------+-------------+----------+----------------+  Mid UA     144     0.59     0.41            +------------+----------+-------------+----------+----------------+  Dist UA     163    0.69     0.32  competing branch  +------------+----------+-------------+----------+----------------+  AC Fossa    169    1.14     0.19            +------------+----------+-------------+----------+----------------+  Prox Forearm  221    0.59     0.19            +------------+----------+-------------+----------+----------------+    Summary:  Patent arteriovenous fistula with one branch observed.    Assessment/Plan:  This is a 58 y.o. female who is s/p left arm brachiocephalic AV fistula creation 12/13/18 by Dr. Donzetta Matters. Her AV fistula is functioning well. This is her second duplex of the fistula to see if it would  mature more. It is borderline for size on duplex, however clinically it is too deep to access. It is easily palpable at the [facility] but over a very short distance. I feel that even with continued exercise and despite further maturation it will still be too difficult to access for HD. I discussed this in depth with the patient and she is agreeable to superficialization of her left AV fistula. She will need this scheduled on a Tuesday or Thursday with Dr. Donzetta Matters as she dialyzes MWF   Karoline Caldwell, PA-C [facility] [phone]  Clinic MD: Dr. Carlis Abbott

## 2019-03-07 NOTE — H&P (View-Only) (Signed)
POST OPERATIVE OFFICE NOTE    CC:  F/u for surgery  HPI:  This is a 59 y.o. female who is s/p left arm brachiocephalic AV fistula 16/1/09 by Dr. Donzetta Matters. She is not currently using the fistula. She dialyzes MWF via a Right IJ TDC.  At the time of her last visit on 01/27/19 she was doing well post op and fistula was functioning well but small. She was asked to increase exercises of her upper arm.  She presents today for follow up and duplex. She has been exercising her left arm as much as possible. She denies any steal symptoms. She is not having any pain, numbness, coldness, or loss of motor of left upper extremity   No Known Allergies  Current Outpatient Medications  Medication Sig Dispense Refill  . acetaminophen (TYLENOL) 500 MG tablet Take 1,000 mg by mouth every 6 (six) hours as needed for moderate pain or headache.    [name] amLODipine (NORVASC) 5 MG tablet Take 5 mg by mouth daily.    . Ascorbic Acid (VITAMIN C) 1000 MG tablet Take 1,000 mg by mouth daily.    [name] aspirin 81 MG EC tablet Take 1 tablet (81 mg total) by mouth daily. Reported on 07/13/2015 90 tablet 0  . atorvastatin (LIPITOR) 80 MG tablet Take 1 tablet (80 mg total) by mouth daily. 90 tablet 2  . Blood Glucose Monitoring Suppl (ACCU-CHEK AVIVA) device Use as instructed three times daily. (Patient taking differently: Use as instructed twice times daily.) 1 each 0  . carvedilol (COREG) 25 MG tablet Take 1 tablet (25 mg total) by mouth 2 (two) times daily. 180 tablet 2  . cholecalciferol (VITAMIN D3) 25 MCG (1000 UT) tablet Take 1,000 Units by mouth daily.    . clopidogrel (PLAVIX) 75 MG tablet Take 1 tablet (75 mg total) by mouth daily. 90 tablet 1  . docusate sodium (COLACE) 100 MG capsule Take 200 mg by mouth 2 (two) times daily.     [name] doxycycline (VIBRA-TABS) 100 MG tablet Take 1 tablet (100 mg total) by mouth 2 (two) times daily. 20 tablet 0  . furosemide (LASIX) 80 MG tablet Take 80 mg by mouth 2 (two) times daily.    [name]  gabapentin (NEURONTIN) 300 MG capsule Take 1 capsule (300 mg total) by mouth 3 (three) times daily. 90 capsule 3  . glipiZIDE (GLUCOTROL) 5 MG tablet Take 0.5 tablets (2.5 mg total) by mouth daily before breakfast. (Patient taking differently: Take 5 mg by mouth daily before breakfast. ) 30 tablet 3  . glucose blood (ACCU-CHEK AVIVA) test strip Use as instructed three times daily before meals. 100 each 12  . hydrALAZINE (APRESOLINE) 50 MG tablet Take 50 mg by mouth 3 (three) times daily.    [name] HYDROcodone-acetaminophen (NORCO) 5-325 MG tablet Take 1 tablet by mouth every 6 (six) hours as needed for moderate pain. 10 tablet 0  . isosorbide mononitrate (IMDUR) 30 MG 24 hr tablet Take 60 mg by mouth daily.    Elmore Guise Devices (ACCU-CHEK SOFTCLIX) lancets Use as instructed three times daily before meals. 1 each 5  . latanoprost (XALATAN) 0.005 % ophthalmic solution Place 1 drop into both eyes at bedtime.     . Omega-3 Fatty Acids (FISH OIL) 1000 MG CAPS Take 1,000 mg by mouth daily.     . ranolazine (RANEXA) 500 MG 12 hr tablet Take 1 tablet (500 mg total) by mouth 2 (two) times daily. 180 tablet 3  . silver sulfADIAZINE (SILVADENE)  1 % cream Apply 1 application topically daily. Apply to affected area daily plus dry dressing (Patient taking differently: Apply 1 application topically daily as needed (wound care). ) 400 g 3  . sitaGLIPtin (JANUVIA) 25 MG tablet Take 1 tablet (25 mg total) by mouth daily. 30 tablet 3  . nitroGLYCERIN (NITROSTAT) 0.4 MG SL tablet Place 1 tablet (0.4 mg total) under the tongue every 5 (five) minutes as needed for chest pain. 25 tablet 3   No current facility-administered medications for this visit.     ROS:  Review of Systems  Constitutional: Negative for chills, fever and malaise/fatigue.  HENT: Negative for congestion and sore throat.   Respiratory: Negative for cough and shortness of breath.   Cardiovascular: Negative for chest pain, palpitations and claudication.    Gastrointestinal: Negative for abdominal pain, constipation, nausea and vomiting.  Genitourinary: Negative for dysuria.  Musculoskeletal: Negative for joint pain.  Neurological: Negative for dizziness, weakness and headaches.    Physical Exam:  Vitals:   03/07/19 1331  BP: 106/61  Pulse: 97  Resp: 18  Temp: (!) 97 F (36.1 C)  TempSrc: Temporal  SpO2: 99%  Weight: 167 lb 11.2 oz (76.1 kg)  Height: 5\' 4"  (1.626 m)   General: well appearing, well nourished Cardiac: regular rate and rhythm Lungs: clear to auscultation bilaterally, non labored Incision:  Left AC incision well healed Extremities:  2+ radial and ulnar pulses, left hand warm. Motor and sensation intact. 5/5 grip strength. Palpable thrill and good bruit in left AV fistula. Easily palpable at the [facility] however becomes deep more proximally in the arm after about 3 cm from the [facility] Neuro: Alert and oriented   Non- invasive studies: 03/07/19 Left upper extremity Dialysis Access Duplex Findings:  +--------------------+----------+-----------------+--------+  AVF         PSV (cm/s)Flow Vol (mL/min)Comments  +--------------------+----------+-----------------+--------+  Native artery inflow  256      623          +--------------------+----------+-----------------+--------+  AVF Anastomosis     349                 +--------------------+----------+-----------------+--------+     +------------+----------+-------------+----------+----------------+  OUTFLOW VEINPSV (cm/s)Diameter (cm)Depth (cm)  Describe    +------------+----------+-------------+----------+----------------+  Shoulder    130    0.58     1.03            +------------+----------+-------------+----------+----------------+  Prox UA     154    0.59     1.00            +------------+----------+-------------+----------+----------------+  Mid UA     144     0.59     0.41            +------------+----------+-------------+----------+----------------+  Dist UA     163    0.69     0.32  competing branch  +------------+----------+-------------+----------+----------------+  AC Fossa    169    1.14     0.19            +------------+----------+-------------+----------+----------------+  Prox Forearm  221    0.59     0.19            +------------+----------+-------------+----------+----------------+    Summary:  Patent arteriovenous fistula with one branch observed.    Assessment/Plan:  This is a 59 y.o. female who is s/p left arm brachiocephalic AV fistula creation 12/13/18 by Dr. Donzetta Matters. Her AV fistula is functioning well. This is her second duplex of the fistula to see if it would  mature more. It is borderline for size on duplex, however clinically it is too deep to access. It is easily palpable at the [facility] but over a very short distance. I feel that even with continued exercise and despite further maturation it will still be too difficult to access for HD. I discussed this in depth with the patient and she is agreeable to superficialization of her left AV fistula. She will need this scheduled on a Tuesday or Thursday with Dr. Donzetta Matters as she dialyzes MWF   Karoline Caldwell, PA-C [facility] [phone]  Clinic MD: Dr. Carlis Abbott

## 2019-03-08 NOTE — Telephone Encounter (Signed)
Pts echo is scheduled for 03/21/19 at 1:45 pm.  Pt made aware of echo appt date and time by [facility] scheduling.

## 2019-03-16 ENCOUNTER — Ambulatory Visit (INDEPENDENT_AMBULATORY_CARE_PROVIDER_SITE_OTHER): Payer: Medicare Other | Admitting: Orthopedic Surgery

## 2019-03-16 ENCOUNTER — Encounter: Payer: Self-pay | Admitting: Orthopedic Surgery

## 2019-03-16 ENCOUNTER — Other Ambulatory Visit: Payer: Self-pay

## 2019-03-16 DIAGNOSIS — I739 Peripheral vascular disease, unspecified: Secondary | ICD-10-CM

## 2019-03-16 NOTE — Progress Notes (Signed)
Office Visit Note   Patient: Rebekah Johnson           Date of Birth: 11-Jul-1960           MRN: [id number] Visit Date: 03/16/2019              Requested by: No referring provider defined for this encounter. PCP: Dallas Schimke, MD  Chief Complaint  Patient presents with  . Left Foot - Follow-up    2017 left transmet. Amp.      HPI: This is a pleasant 59 year old woman who follows up for 2 issues.  She is status post left transmetatarsal amputation in 2017 and has a very small callus on the medial side of her incision.  She reports no drainage no foul odor or no redness.  She just wants to make sure it is okay.  She also is wondering how often she should have her nails trimmed on her right foot  Assessment & Plan: Visit Diagnoses: No diagnosis found.  Plan: She will follow up for nail trim on the right.  If she has any recurrence of thickened callus on the left she will contact us immediately  Follow-Up Instructions: No follow-ups on file.   Ortho Exam  Patient is alert, oriented, no adenopathy, well-dressed, normal affect, normal respiratory effort. Focused examination of the left foot demonstrates well-healed surgical incision from her transmetatarsal amputation.  No swelling no erythema no drainage no necrosis.  She has a very thin callus on the medial side.  After obtaining verbal permission this was trimmed to a healthy surface  On the right side her foot is in excellent condition she has recently had her nails trimmed I do not think they warrant trimmed today  Imaging: No results found. No images are attached to the encounter.  Labs: Lab Results  Component Value Date   HGBA1C 6.9 (A) 02/02/2019   HGBA1C 5.6 11/01/2018   HGBA1C 5.5 08/31/2018   ESRSEDRATE 57 (H) 07/13/2015   CRP 0.7 07/13/2015   LABURIC 5.7 07/13/2015   REPTSTATUS 01/04/2017 FINAL 01/04/2017   CULT (A) 01/01/2017    VIRIDANS STREPTOCOCCUS THE SIGNIFICANCE OF ISOLATING THIS ORGANISM FROM A  SINGLE SET OF BLOOD CULTURES WHEN MULTIPLE SETS ARE DRAWN IS UNCERTAIN. PLEASE NOTIFY THE MICROBIOLOGY DEPARTMENT WITHIN ONE WEEK IF SPECIATION AND SENSITIVITIES ARE REQUIRED. Performed at [facility], [place] [address] [place], [place] [address]      Lab Results  Component Value Date   ALBUMIN 3.9 04/22/2018   ALBUMIN 4.0 10/07/2017   ALBUMIN 3.3 (L) 07/20/2017   LABURIC 5.7 07/13/2015    Lab Results  Component Value Date   MG 1.7 01/01/2017   MG 1.7 09/16/2016   MG 1.7 09/14/2016   No results found for: VD25OH  No results found for: PREALBUMIN CBC EXTENDED Latest Ref Rng & Units 12/13/2018 04/22/2018 07/20/2017  WBC 4.0 - 10.5 K/uL - 11.3(H) 8.4  RBC 3.87 - 5.11 MIL/uL - 4.91 3.98  HGB 12.0 - 15.0 g/dL 14.6 13.4 10.8(L)  HCT 36.0 - 46.0 % 43.0 42.9 32.9(L)  PLT 150 - 400 K/uL - 239 229  NEUTROABS 1.7 - 7.7 K/uL - 8.1(H) 5.9  LYMPHSABS 0.7 - 4.0 K/uL - 2.1 1.6     There is no height or weight on file to calculate BMI.  Orders:  No orders of the defined types were placed in this encounter.  No orders of the defined types were placed in this encounter.  Procedures: No procedures performed  Clinical Data: No additional findings.  ROS:  All other systems negative, except as noted in the HPI. Review of Systems  Objective: Vital Signs: There were no vitals taken for this visit.  Specialty Comments:  No specialty comments available.  PMFS History: Patient Active Problem List   Diagnosis Date Noted  . Multinodular goiter 08/31/2018  . Cardiac arrest ([place]) 11/22/2017  . DOE (dyspnea on exertion) 09/01/2017  . COPD GOLD  0 08/31/2017  . Diabetic polyneuropathy associated with type 2 diabetes mellitus ([place]) 08/19/2017  . NSTEMI (non-ST elevated myocardial infarction) ([place]) 01/01/2017  . Chest pain 01/01/2017  . Tobacco abuse   . PVD (peripheral vascular disease) ([place])   . Proteinuria   . Obesity   . Normocytic anemia   . Hypertension   . High  cholesterol   . Foot amputation status   . Diabetes mellitus with neuropathy ([place])   . CKD (chronic kidney disease), stage IV ([place])   . Chronic diastolic CHF (congestive heart failure) ([employer])   . CAD (coronary artery disease) 10/27/2016  . Chronic kidney disease 09/23/2016  . Acute congestive heart failure ([place])   . Hypertensive heart disease with heart failure ([place])   . Chronic combined systolic and diastolic heart failure ([place]) 09/14/2016  . Onychomycosis 12/24/2015  . Status post transmetatarsal amputation of foot, left ([place]) 09/06/2015  . Diabetic foot ulcer ([place]) 07/13/2015  . Cellulitis 07/12/2015  . HLD (hyperlipidemia) 09/02/2012  . Peripheral nerve disease 09/02/2012  . Type 2 diabetes mellitus with peripheral angiopathy ([place]) 09/02/2012  . Hypertriglyceridemia 07/28/2012  . Peripheral vascular disease ([place]) 07/28/2012  . Avitaminosis D 07/28/2012  . Essential (primary) hypertension 06/10/2012  . History of biliary T-tube placement 06/10/2012  . Leg pain 06/10/2012  . Current tobacco use 06/10/2012  . Atrophic vaginitis 06/07/2012  . Type 2 diabetes mellitus ([place]) 06/07/2012   Past Medical History:  Diagnosis Date  . Acute congestive heart failure ([place])   . Acute diastolic CHF (congestive heart failure) ([place]) 09/14/2016  . Atrophic vaginitis 06/07/2012  . Avitaminosis D 07/28/2012  . CAD (coronary artery disease)    a. NSTEM 09/2016: cath showing severe diffuse disease of the RCA, ramus and Cx with mild-mod disease of LAD; PCI would require multiple stents and significant contrast usage thus medical therapy recommended.  . Cellulitis 07/12/2015  . Chest pain 01/01/2017  . Chronic diastolic CHF (congestive heart failure) ([place])   . Chronic kidney disease 09/23/2016  . CKD (chronic kidney disease), stage IV ([place])   . Constipation   . COPD GOLD  0 08/31/2017   Quit smoking 07/17/17 - Spirometry 09/01/2017  FEV1 1.23 (58%)  Ratio 57 with mild curvature p no rx - 09/01/2017  After  extensive coaching inhaler device,  effectiveness =    75% with elipta with cough provoked  - PFT's  10/14/2017  FEV1 1.69 (68 % ) ratio 84  p no % improvement from saba p nothing prior to study with DLCO  50 % corrects to 76  % for alv volume   - 10/14/2017  Walked RA x 3 laps @ 1  . Current tobacco use 06/10/2012  . Diabetes mellitus with neuropathy ([place])   . Diabetic foot ulcer ([place]) 07/13/2015  . Diabetic polyneuropathy associated with type 2 diabetes mellitus ([place]) 08/19/2017  . DOE (dyspnea on exertion) 09/01/2017   09/01/2017  Walked RA x 3 laps @ 185 ft each stopped due to  End of study, nl to  mod fast  pace, no  desat   But stopped to rest x 2  Spirometry 09/01/2017  FEV1 1.23 (58%)  Ratio 57  > trial of anoro    . Dyspnea    with exertion  . ESRD (end stage renal disease) ([place])    MWF - [facility]  . Essential (primary) hypertension 06/10/2012  . Foot amputation status    a. h/o left foot transmetatarsal amputation  . Headache    Migraines  . High cholesterol   . History of biliary T-tube placement 06/10/2012   Patient unaware   . History of cardiac arrest   . HLD (hyperlipidemia) 09/02/2012   Overview:  ICD-10 cut over    . Hypertension    a. patent renal arteries by PV angio 08/2015. b. heavy proteinuria 09/2016 ? nephrotic.  [name] Hypertensive heart disease with heart failure ([place])   . Hypertriglyceridemia 07/28/2012  . Leg pain 06/10/2012  . Normocytic anemia   . NSTEMI (non-ST elevated myocardial infarction) ([place]) 01/01/2017  . Obesity   . Onychomycosis 12/24/2015  . Peripheral nerve disease 09/02/2012  . Peripheral vascular disease ([place]) 07/28/2012  . Proteinuria   . PVD (peripheral vascular disease) ([place])    a. status post left SFA and popliteal stent and left SFA PTA with drug coated balloon in 08/2015.  . Status post transmetatarsal amputation of foot, left ([place]) 09/06/2015  . Tobacco abuse   . Type 2 diabetes mellitus ([place]) 06/07/2012  . Type 2 diabetes mellitus with peripheral  angiopathy ([place]) 09/02/2012    Family History  Problem Relation Age of Onset  . Diabetes Mother   . Hypertension Father     Past Surgical History:  Procedure Laterality Date  . ABDOMINAL HYSTERECTOMY    . AMPUTATION Left 09/06/2015   Procedure: LEFT TRANSMETATARSAL AMPUTATTION;  Surgeon: Newt Minion, MD;  Location: [facility];  Service: Orthopedics;  Laterality: Left;  . AV FISTULA PLACEMENT Left 12/13/2018   Procedure: ARTERIOVENOUS (AV) FISTULA CREATION LEFT UPPER ARM;  Surgeon: Waynetta Sandy, MD;  Location: [facility];  Service: Vascular;  Laterality: Left;  . COLONOSCOPY     polyps x 1  . LEFT HEART CATH AND CORONARY ANGIOGRAPHY N/A 09/17/2016   Procedure: LEFT HEART CATH AND CORONARY ANGIOGRAPHY;  Surgeon: Jettie Booze, MD;  Location: [facility];  Service: Cardiovascular;  Laterality: N/A;  . PERIPHERAL VASCULAR CATHETERIZATION N/A 08/16/2015   Procedure: Abdominal Aortogram;  Surgeon: Elam Dutch, MD;  Location: [facility];  Service: Cardiovascular;  Laterality: N/A;  . PERIPHERAL VASCULAR CATHETERIZATION Bilateral 08/16/2015   Procedure: Lower Extremity Angiography;  Surgeon: Elam Dutch, MD;  Location: [facility];  Service: Cardiovascular;  Laterality: Bilateral;  . PERIPHERAL VASCULAR CATHETERIZATION Left 08/16/2015   Procedure: Peripheral Vascular Intervention;  Surgeon: Elam Dutch, MD;  Location: [facility];  Service: Cardiovascular;  Laterality: Left;  SFA STENT X 2   Social History   Occupational History  . Not on file  Tobacco Use  . Smoking status: Former Smoker    Packs/day: 1.00    Years: 42.00    Pack years: 42.00    Types: Cigarettes    Quit date: 07/17/2017    Years since quitting: 1.6  . Smokeless tobacco: Never Used  Substance and Sexual Activity  . Alcohol use: Yes    Alcohol/week: 1.0 - 2.0 standard drinks    Types: 1 - 2 Glasses of wine per week  Comment: on social occassions  . Drug use: Yes     Types: Marijuana    Comment: Last time 12/12/2018 "every chance I get"  . Sexual activity: Not on file

## 2019-03-17 ENCOUNTER — Encounter (HOSPITAL_BASED_OUTPATIENT_CLINIC_OR_DEPARTMENT_OTHER): Payer: Self-pay | Admitting: *Deleted

## 2019-03-17 ENCOUNTER — Emergency Department (HOSPITAL_BASED_OUTPATIENT_CLINIC_OR_DEPARTMENT_OTHER): Payer: Medicare Other

## 2019-03-17 ENCOUNTER — Other Ambulatory Visit: Payer: Self-pay

## 2019-03-17 ENCOUNTER — Emergency Department (HOSPITAL_BASED_OUTPATIENT_CLINIC_OR_DEPARTMENT_OTHER)
Admission: EM | Admit: 2019-03-17 | Discharge: 2019-03-17 | Disposition: A | Discharge status: Home / Self Care | Payer: Medicare Other | Attending: Emergency Medicine | Admitting: Emergency Medicine

## 2019-03-17 DIAGNOSIS — N186 End stage renal disease: Secondary | ICD-10-CM | POA: Insufficient documentation

## 2019-03-17 DIAGNOSIS — I2510 Atherosclerotic heart disease of native coronary artery without angina pectoris: Secondary | ICD-10-CM | POA: Diagnosis not present

## 2019-03-17 DIAGNOSIS — Z87891 Personal history of nicotine dependence: Secondary | ICD-10-CM | POA: Insufficient documentation

## 2019-03-17 DIAGNOSIS — E1122 Type 2 diabetes mellitus with diabetic chronic kidney disease: Secondary | ICD-10-CM | POA: Insufficient documentation

## 2019-03-17 DIAGNOSIS — Z992 Dependence on renal dialysis: Secondary | ICD-10-CM | POA: Diagnosis not present

## 2019-03-17 DIAGNOSIS — K5641 Fecal impaction: Secondary | ICD-10-CM | POA: Insufficient documentation

## 2019-03-17 DIAGNOSIS — E782 Mixed hyperlipidemia: Secondary | ICD-10-CM | POA: Insufficient documentation

## 2019-03-17 DIAGNOSIS — Z7984 Long term (current) use of oral hypoglycemic drugs: Secondary | ICD-10-CM | POA: Diagnosis not present

## 2019-03-17 DIAGNOSIS — I5032 Chronic diastolic (congestive) heart failure: Secondary | ICD-10-CM | POA: Insufficient documentation

## 2019-03-17 DIAGNOSIS — K5900 Constipation, unspecified: Secondary | ICD-10-CM

## 2019-03-17 DIAGNOSIS — I132 Hypertensive heart and chronic kidney disease with heart failure and with stage 5 chronic kidney disease, or end stage renal disease: Secondary | ICD-10-CM | POA: Diagnosis not present

## 2019-03-17 DIAGNOSIS — I252 Old myocardial infarction: Secondary | ICD-10-CM | POA: Diagnosis not present

## 2019-03-17 DIAGNOSIS — Z79899 Other long term (current) drug therapy: Secondary | ICD-10-CM | POA: Diagnosis not present

## 2019-03-17 LAB — CBC WITH DIFFERENTIAL/PLATELET
Abs Immature Granulocytes: 0.05 10*3/uL (ref 0.00–0.07)
Basophils Absolute: 0.1 10*3/uL (ref 0.0–0.1)
Basophils Relative: 1 %
Eosinophils Absolute: 0.1 10*3/uL (ref 0.0–0.5)
Eosinophils Relative: 1 %
HCT: 41.4 % (ref 36.0–46.0)
Hemoglobin: 13.2 g/dL (ref 12.0–15.0)
Immature Granulocytes: 1 %
Lymphocytes Relative: 37 %
Lymphs Abs: 3.8 10*3/uL (ref 0.7–4.0)
MCH: 28.9 pg (ref 26.0–34.0)
MCHC: 31.9 g/dL (ref 30.0–36.0)
MCV: 90.6 fL (ref 80.0–100.0)
Monocytes Absolute: 0.7 10*3/uL (ref 0.1–1.0)
Monocytes Relative: 7 %
Neutro Abs: 5.6 10*3/uL (ref 1.7–7.7)
Neutrophils Relative %: 53 %
Platelets: 281 10*3/uL (ref 150–400)
RBC: 4.57 MIL/uL (ref 3.87–5.11)
RDW: 14.4 % (ref 11.5–15.5)
WBC: 10.3 10*3/uL (ref 4.0–10.5)
nRBC: 0 % (ref 0.0–0.2)

## 2019-03-17 LAB — COMPREHENSIVE METABOLIC PANEL
ALT: 21 U/L (ref 0–44)
AST: 23 U/L (ref 15–41)
Albumin: 4.1 g/dL (ref 3.5–5.0)
Alkaline Phosphatase: 188 U/L — ABNORMAL HIGH (ref 38–126)
Anion gap: 15 (ref 5–15)
BUN: 18 mg/dL (ref 8–23)
CO2: 29 mmol/L (ref 22–32)
Calcium: 9.2 mg/dL (ref 8.9–10.3)
Chloride: 91 mmol/L — ABNORMAL LOW (ref 98–111)
Creatinine, Ser: 3.26 mg/dL — ABNORMAL HIGH (ref 0.44–1.00)
GFR calc Af Amer: 17 mL/min — ABNORMAL LOW (ref >60)
GFR calc non Af Amer: 15 mL/min — ABNORMAL LOW (ref >60)
Glucose, Bld: 212 mg/dL — ABNORMAL HIGH (ref 70–99)
Potassium: 3.8 mmol/L (ref 3.5–5.1)
Sodium: 135 mmol/L (ref 135–145)
Total Bilirubin: 0.2 mg/dL — ABNORMAL LOW (ref 0.3–1.2)
Total Protein: 8.8 g/dL — ABNORMAL HIGH (ref 6.5–8.1)

## 2019-03-17 MED ORDER — GOLYTELY 236 G PO SOLR: 4000.0000 mL | Freq: Once | ORAL | 0 refills | Status: AC

## 2019-03-17 MED ORDER — FLEET ENEMA 7-19 GM/118ML RE ENEM
1.0000 | ENEMA | Freq: Once | RECTAL | Status: AC
  Administered 2019-03-17: 13:00:00 1 via RECTAL
  Filled 2019-03-17: qty 1

## 2019-03-17 MED ORDER — MORPHINE SULFATE (PF) 4 MG/ML IV SOLN
4.0000 mg | Freq: Once | INTRAVENOUS | Status: AC
  Administered 2019-03-17: 4 mg via INTRAVENOUS
  Filled 2019-03-17: qty 1

## 2019-03-17 MED ORDER — LIDOCAINE HCL URETHRAL/MUCOSAL 2 % EX GEL
1.0000 "application " | Freq: Once | CUTANEOUS | Status: AC
  Administered 2019-03-17: 1 via TOPICAL
  Filled 2019-03-17: qty 20

## 2019-03-17 MED ORDER — LORAZEPAM 2 MG/ML IJ SOLN
1.0000 mg | Freq: Once | INTRAMUSCULAR | Status: AC
  Administered 2019-03-17: 1 mg via INTRAVENOUS
  Filled 2019-03-17: qty 1

## 2019-03-17 NOTE — ED Provider Notes (Signed)
[facility] EMERGENCY DEPARTMENT Provider Note   CSN: [id number] Arrival date & time: 03/17/19  1131     History Chief Complaint  Patient presents with  . Constipation    Rebekah Johnson is a 59 y.o. female.  HPI   59 year old female, with PMH of CHF, CAD, DM, ESRD, PVD, presents with abdominal pain.  Patient states that she is constipated.  History difficult to obtain due to patient writhing around in bed stating she is in pain.  She states that her bowels have not been right since she found a family member dead on 2022-04-09.  She gave herself an enema and states she has some improvement but constipation worsened.  She notes diffuse abdominal pain and rectal pain.  She feels that she has stool in the rectum.  She denies any fevers, chills, nausea, vomiting, diarrhea.    Past Medical History:  Diagnosis Date  . Acute congestive heart failure ([place])   . Acute diastolic CHF (congestive heart failure) ([place]) 09/14/2016  . Atrophic vaginitis 06/07/2012  . Avitaminosis D 07/28/2012  . CAD (coronary artery disease)    a. NSTEM 09/2016: cath showing severe diffuse disease of the RCA, ramus and Cx with mild-mod disease of LAD; PCI would require multiple stents and significant contrast usage thus medical therapy recommended.  . Cellulitis 07/12/2015  . Chest pain 01/01/2017  . Chronic diastolic CHF (congestive heart failure) ([place])   . Chronic kidney disease 09/23/2016  . CKD (chronic kidney disease), stage IV ([place])   . Constipation   . COPD GOLD  0 08/31/2017   Quit smoking 07/17/17 - Spirometry 09/01/2017  FEV1 1.23 (58%)  Ratio 57 with mild curvature p no rx - 09/01/2017  After extensive coaching inhaler device,  effectiveness =    75% with elipta with cough provoked  - PFT's  10/14/2017  FEV1 1.69 (68 % ) ratio 84  p no % improvement from saba p nothing prior to study with DLCO  50 % corrects to 76  % for alv volume   - 10/14/2017  Walked RA x 3 laps @ 1  . Current tobacco use 06/10/2012    . Diabetes mellitus with neuropathy ([place])   . Diabetic foot ulcer ([place]) 07/13/2015  . Diabetic polyneuropathy associated with type 2 diabetes mellitus ([place]) 08/19/2017  . DOE (dyspnea on exertion) 09/01/2017   09/01/2017  Walked RA x 3 laps @ 185 ft each stopped due to  End of study, nl to mod fast  pace, no  desat   But stopped to rest x 2  Spirometry 09/01/2017  FEV1 1.23 (58%)  Ratio 57  > trial of anoro    . Dyspnea    with exertion  . ESRD (end stage renal disease) ([place])    MWF - [facility]  . Essential (primary) hypertension 06/10/2012  . Foot amputation status    a. h/o left foot transmetatarsal amputation  . Headache    Migraines  . High cholesterol   . History of biliary T-tube placement 06/10/2012   Patient unaware   . History of cardiac arrest   . HLD (hyperlipidemia) 09/02/2012   Overview:  ICD-10 cut over    . Hypertension    a. patent renal arteries by PV angio 08/2015. b. heavy proteinuria 09/2016 ? nephrotic.  [name] Hypertensive heart disease with heart failure ([place])   . Hypertriglyceridemia 07/28/2012  . Leg pain 06/10/2012  . Normocytic anemia   . NSTEMI (non-ST elevated myocardial infarction) ([place]) 01/01/2017  .  Obesity   . Onychomycosis 12/24/2015  . Peripheral nerve disease 09/02/2012  . Peripheral vascular disease ([place]) 07/28/2012  . Proteinuria   . PVD (peripheral vascular disease) ([place])    a. status post left SFA and popliteal stent and left SFA PTA with drug coated balloon in 08/2015.  . Status post transmetatarsal amputation of foot, left ([place]) 09/06/2015  . Tobacco abuse   . Type 2 diabetes mellitus ([place]) 06/07/2012  . Type 2 diabetes mellitus with peripheral angiopathy ([place]) 09/02/2012    Patient Active Problem List   Diagnosis Date Noted  . Multinodular goiter 08/31/2018  . Cardiac arrest ([place]) 11/22/2017  . DOE (dyspnea on exertion) 09/01/2017  . COPD GOLD  0 08/31/2017  . Diabetic polyneuropathy associated with type 2 diabetes mellitus ([place]) 08/19/2017  . NSTEMI  (non-ST elevated myocardial infarction) ([place]) 01/01/2017  . Chest pain 01/01/2017  . Tobacco abuse   . PVD (peripheral vascular disease) ([place])   . Proteinuria   . Obesity   . Normocytic anemia   . Hypertension   . High cholesterol   . Foot amputation status   . Diabetes mellitus with neuropathy ([place])   . CKD (chronic kidney disease), stage IV ([place])   . Chronic diastolic CHF (congestive heart failure) ([place])   . CAD (coronary artery disease) 10/27/2016  . Chronic kidney disease 09/23/2016  . Acute congestive heart failure ([place])   . Hypertensive heart disease with heart failure ([place])   . Chronic combined systolic and diastolic heart failure ([place]) 09/14/2016  . Onychomycosis 12/24/2015  . Status post transmetatarsal amputation of foot, left ([place]) 09/06/2015  . Diabetic foot ulcer ([place]) 07/13/2015  . Cellulitis 07/12/2015  . HLD (hyperlipidemia) 09/02/2012  . Peripheral nerve disease 09/02/2012  . Type 2 diabetes mellitus with peripheral angiopathy ([place]) 09/02/2012  . Hypertriglyceridemia 07/28/2012  . Peripheral vascular disease ([place]) 07/28/2012  . Avitaminosis D 07/28/2012  . Essential (primary) hypertension 06/10/2012  . History of biliary T-tube placement 06/10/2012  . Leg pain 06/10/2012  . Current tobacco use 06/10/2012  . Atrophic vaginitis 06/07/2012  . Type 2 diabetes mellitus ([place]) 06/07/2012    Past Surgical History:  Procedure Laterality Date  . ABDOMINAL HYSTERECTOMY    . AMPUTATION Left 09/06/2015   Procedure: LEFT TRANSMETATARSAL AMPUTATTION;  Surgeon: Newt Minion, MD;  Location: [facility];  Service: Orthopedics;  Laterality: Left;  . AV FISTULA PLACEMENT Left 12/13/2018   Procedure: ARTERIOVENOUS (AV) FISTULA CREATION LEFT UPPER ARM;  Surgeon: Waynetta Sandy, MD;  Location: [facility];  Service: Vascular;  Laterality: Left;  . COLONOSCOPY     polyps x 1  . LEFT HEART CATH AND CORONARY ANGIOGRAPHY N/A 09/17/2016   Procedure: LEFT HEART CATH AND CORONARY  ANGIOGRAPHY;  Surgeon: Jettie Booze, MD;  Location: [facility];  Service: Cardiovascular;  Laterality: N/A;  . PERIPHERAL VASCULAR CATHETERIZATION N/A 08/16/2015   Procedure: Abdominal Aortogram;  Surgeon: Elam Dutch, MD;  Location: [facility];  Service: Cardiovascular;  Laterality: N/A;  . PERIPHERAL VASCULAR CATHETERIZATION Bilateral 08/16/2015   Procedure: Lower Extremity Angiography;  Surgeon: Elam Dutch, MD;  Location: [facility];  Service: Cardiovascular;  Laterality: Bilateral;  . PERIPHERAL VASCULAR CATHETERIZATION Left 08/16/2015   Procedure: Peripheral Vascular Intervention;  Surgeon: Elam Dutch, MD;  Location: [facility];  Service: Cardiovascular;  Laterality: Left;  SFA STENT X 2     OB History   No obstetric history on file.  Family History  Problem Relation Age of Onset  . Diabetes Mother   . Hypertension Father     Social History   Tobacco Use  . Smoking status: Former Smoker    Packs/day: 1.00    Years: 42.00    Pack years: 42.00    Types: Cigarettes    Quit date: 07/17/2017    Years since quitting: 1.6  . Smokeless tobacco: Never Used  Substance Use Topics  . Alcohol use: Yes    Alcohol/week: 1.0 - 2.0 standard drinks    Types: 1 - 2 Glasses of wine per week    Comment: on social occassions  . Drug use: Yes    Types: Marijuana    Comment: Last time 12/12/2018 "every chance I get"    Home Medications Prior to Admission medications   Medication Sig Start Date End Date Taking? Authorizing Provider  acetaminophen (TYLENOL) 500 MG tablet Take 1,000 mg by mouth every 6 (six) hours as needed for moderate pain or headache.   Yes [provider]  amLODipine (NORVASC) 5 MG tablet Take 5 mg by mouth daily.   Yes [provider]  Ascorbic Acid (VITAMIN C) 1000 MG tablet Take 1,000 mg by mouth daily.   Yes [provider]  aspirin 81 MG EC tablet Take 1 tablet (81 mg total) by mouth  daily. Reported on 07/13/2015 07/02/16  Yes Charlott Rakes, MD  atorvastatin (LIPITOR) 80 MG tablet Take 1 tablet (80 mg total) by mouth daily. 10/07/17  Yes Scot Jun, FNP  carvedilol (COREG) 25 MG tablet Take 1 tablet (25 mg total) by mouth 2 (two) times daily. 10/07/17  Yes Scot Jun, FNP  cholecalciferol (VITAMIN D3) 25 MCG (1000 UT) tablet Take 1,000 Units by mouth daily.   Yes [provider]  clopidogrel (PLAVIX) 75 MG tablet Take 1 tablet (75 mg total) by mouth daily. 10/07/17  Yes Scot Jun, FNP  docusate sodium (COLACE) 100 MG capsule Take 200 mg by mouth 2 (two) times daily.    Yes [provider]  gabapentin (NEURONTIN) 300 MG capsule Take 1 capsule (300 mg total) by mouth 3 (three) times daily. 10/07/17  Yes Scot Jun, FNP  glipiZIDE (GLUCOTROL) 5 MG tablet Take 0.5 tablets (2.5 mg total) by mouth daily before breakfast. Patient taking differently: Take 5 mg by mouth daily before breakfast.  11/01/18  Yes Renato Shin, MD  hydrALAZINE (APRESOLINE) 50 MG tablet Take 50 mg by mouth 3 (three) times daily.   Yes [provider]  isosorbide mononitrate (IMDUR) 30 MG 24 hr tablet Take 60 mg by mouth daily. 03/22/18  Yes [provider]  latanoprost (XALATAN) 0.005 % ophthalmic solution Place 1 drop into both eyes at bedtime.    Yes [provider]  Omega-3 Fatty Acids (FISH OIL) 1000 MG CAPS Take 1,000 mg by mouth daily.    Yes [provider]  ranolazine (RANEXA) 500 MG 12 hr tablet Take 1 tablet (500 mg total) by mouth 2 (two) times daily. 02/14/18  Yes Dorothy Spark, MD  sitaGLIPtin (JANUVIA) 25 MG tablet Take 1 tablet (25 mg total) by mouth daily. 10/07/17  Yes Scot Jun, FNP  Blood Glucose Monitoring Suppl (ACCU-CHEK AVIVA) device Use as instructed three times daily. Patient taking differently: Use as instructed twice times daily. 04/22/16   Charlott Rakes, MD  furosemide (LASIX) 80 MG tablet  Take 80 mg by mouth 2 (two) times daily. 03/21/18   [provider]  glucose blood (ACCU-CHEK AVIVA) test strip Use as instructed three times daily before meals. 10/14/17   Charlott Rakes, MD  Lancet Devices [facility]) lancets Use as instructed three times daily before meals. 04/22/16   Charlott Rakes, MD  nitroGLYCERIN (NITROSTAT) 0.4 MG SL tablet Place 1 tablet (0.4 mg total) under the tongue every 5 (five) minutes as needed for chest pain. 01/18/17 02/14/19  Dorothy Spark, MD  silver sulfADIAZINE (SILVADENE) 1 % cream Apply 1 application topically daily. Apply to affected area daily plus dry dressing Patient taking differently: Apply 1 application topically daily as needed (wound care).  07/11/18   Newt Minion, MD    Allergies    Patient has no known allergies.  Review of Systems   Review of Systems  Constitutional: Negative for chills and fever.  Respiratory: Negative for shortness of breath.   Cardiovascular: Negative for chest pain.  Gastrointestinal: Positive for abdominal pain, constipation and rectal pain. Negative for blood in stool, diarrhea, nausea and vomiting.  Genitourinary: Negative for dysuria.    Physical Exam Updated Vital Signs BP (!) 156/78 (BP Location: Right Arm)   Pulse 81   Temp 98 F (36.7 C) (Oral)   Resp 20   SpO2 100%   Physical Exam Vitals and nursing note reviewed.  Constitutional:      General: She is in acute distress (writhing).     Appearance: She is well-developed.  HENT:     Head: Normocephalic and atraumatic.  Eyes:     Conjunctiva/sclera: Conjunctivae normal.  Cardiovascular:     Rate and Rhythm: Normal rate and regular rhythm.     Heart sounds: Normal heart sounds. No murmur.  Pulmonary:     Effort: Pulmonary effort is normal. No respiratory distress.     Breath sounds: Normal breath sounds. No wheezing or rales.  Abdominal:     General: There is no distension.     Palpations: Abdomen is soft.     Tenderness:  There is generalized abdominal tenderness.  Musculoskeletal:        General: No tenderness or deformity. Normal range of motion.     Cervical back: Neck supple.  Skin:    General: Skin is warm and dry.     Findings: No erythema or rash.  Neurological:     Mental Status: She is alert and oriented to person, place, and time.  Psychiatric:        Behavior: Behavior is uncooperative.     ED Results / Procedures / Treatments   Labs (all labs ordered are listed, but only abnormal results are displayed) Labs Reviewed  COMPREHENSIVE METABOLIC PANEL - Abnormal; Notable for the following components:      Result Value   Chloride 91 (*)    Glucose, Bld 212 (*)    Creatinine, Ser 3.26 (*)    Total Protein 8.8 (*)    Alkaline Phosphatase 188 (*)    Total Bilirubin 0.2 (*)    GFR calc non Af Amer 15 (*)    GFR calc Af Amer 17 (*)    All other components within normal limits  CBC WITH DIFFERENTIAL/PLATELET    EKG None  Radiology DG Abdomen Acute W/Chest  Result Date: 03/17/2019 CLINICAL DATA:  Constipation and rectal pain. EXAM: DG ABDOMEN ACUTE W/ 1V CHEST COMPARISON:  Chest radiograph 10/14/2017 FINDINGS: Right-sided dialysis catheter tip at the atrial caval junction. Borderline cardiomegaly. The cardiomediastinal contours are normal. The lungs are clear. No pulmonary edema or pleural  fluid. There is no free intra-abdominal air. No dilated bowel loops to suggest obstruction. Moderate stool in the ascending, transverse, and rectosigmoid colon. Stool distended rectum a 5.4 cm. Calcifications projecting over the renal hila may be vascular or stones. Surgical clips in the pelvis. No acute osseous abnormalities are seen. IMPRESSION: 1. Moderate colonic stool burden with stool distending the rectum consistent constipation. Mild rectal distention with stool. 2. Clear lungs. Electronically Signed   By: Keith Rake M.D.   On: 03/17/2019 12:57    Procedures Procedures (including critical care  time)  Medications Ordered in ED Medications  morphine 4 MG/ML injection 4 mg (4 mg Intravenous Given 03/17/19 1202)  sodium phosphate (FLEET) 7-19 GM/118ML enema 1 enema (1 enema Rectal Given 03/17/19 1315)  lidocaine (XYLOCAINE) 2 % jelly 1 application (1 application Topical Given by Other 03/17/19 1448)  LORazepam (ATIVAN) injection 1 mg (1 mg Intravenous Given 03/17/19 1448)    ED Course  I have reviewed the triage vital signs and the nursing notes.  Pertinent labs & imaging results that were available during my care of the patient were reviewed by me and considered in my medical decision making (see chart for details).    MDM Rules/Calculators/A&P                        Patient presents with complaints of constipation.  Patient writhing around in bed on initial evaluation.  She is very uncooperative while I attempt to obtain history and physical.  She initially refused rectal exam.  CBC unremarkable.  CMP shows her CKD, otherwise stable.  Acute abdomen series shows stool.  Patient was given narcotic pain medicine, Ativan and lidocaine in the rectum.  After multiple attempts, she is still declining rectal exam.  She is still refusing disimpaction.  Attempted to use finger and attempted to do use disimpacter and she still hits my arm away.  She grabs my arm and pulls my arm away.  I discussed with her that if she will not let me do a disimpaction then there is nothing else I can do for her in the emergency room.  She is still refusing exam.  Will discharge with GoLYTELY.    At this time there does not appear to be any evidence of an acute emergency medical condition and the patient appears stable for discharge with appropriate outpatient follow up.Diagnosis was discussed with patient who verbalizes understanding and is agreeable to discharge. Pt case discussed with Dr. Karle Starch who agrees with my plan.     Final Clinical Impression(s) / ED Diagnoses Final diagnoses:  None    Rx / DC  Orders ED Discharge Orders    None       Rachel Moulds 03/17/19 1731    Truddie Hidden, MD 03/18/19 [phone]

## 2019-03-17 NOTE — Discharge Instructions (Addendum)
Take GoLYTELY as prescribed.  Please follow-up with your primary care provider for continued evaluation in regards to your recurrent constipation.  Return to the ED immediately for new or worsening symptoms or concerns, such as chest pain, shortness of breath, vomiting, fevers or any concerns at all.

## 2019-03-17 NOTE — ED Triage Notes (Signed)
Constipated since Wednesday.

## 2019-03-17 NOTE — ED Notes (Signed)
PA at bedside trying to disimpact patient.  Patient tried to grab the EDP stating that her bottom hurts and to give her a break.  EDP explained that she already got her medicine and that she needed to removed the feces.  EDP used the fecal disimpaction tool with lido.  Patient continues to complaint that her bottom is sore. EDP explained to her that she will send her home with medicine.

## 2019-03-20 ENCOUNTER — Other Ambulatory Visit: Payer: Self-pay

## 2019-03-20 ENCOUNTER — Inpatient Hospital Stay (HOSPITAL_COMMUNITY): Admission: RE | Admit: 2019-03-20 | Payer: Medicare Other | Source: Ambulatory Visit

## 2019-03-20 ENCOUNTER — Encounter (HOSPITAL_BASED_OUTPATIENT_CLINIC_OR_DEPARTMENT_OTHER): Payer: Self-pay | Admitting: *Deleted

## 2019-03-20 ENCOUNTER — Emergency Department (HOSPITAL_BASED_OUTPATIENT_CLINIC_OR_DEPARTMENT_OTHER)
Admission: EM | Admit: 2019-03-20 | Discharge: 2019-03-20 | Disposition: A | Discharge status: Home / Self Care | Payer: Medicare Other | Attending: Emergency Medicine | Admitting: Emergency Medicine

## 2019-03-20 ENCOUNTER — Encounter (HOSPITAL_COMMUNITY): Payer: Self-pay | Admitting: Vascular Surgery

## 2019-03-20 DIAGNOSIS — Z87891 Personal history of nicotine dependence: Secondary | ICD-10-CM | POA: Insufficient documentation

## 2019-03-20 DIAGNOSIS — Z7984 Long term (current) use of oral hypoglycemic drugs: Secondary | ICD-10-CM | POA: Insufficient documentation

## 2019-03-20 DIAGNOSIS — J449 Chronic obstructive pulmonary disease, unspecified: Secondary | ICD-10-CM | POA: Insufficient documentation

## 2019-03-20 DIAGNOSIS — I132 Hypertensive heart and chronic kidney disease with heart failure and with stage 5 chronic kidney disease, or end stage renal disease: Secondary | ICD-10-CM | POA: Insufficient documentation

## 2019-03-20 DIAGNOSIS — Z7982 Long term (current) use of aspirin: Secondary | ICD-10-CM | POA: Diagnosis not present

## 2019-03-20 DIAGNOSIS — I2510 Atherosclerotic heart disease of native coronary artery without angina pectoris: Secondary | ICD-10-CM | POA: Insufficient documentation

## 2019-03-20 DIAGNOSIS — I252 Old myocardial infarction: Secondary | ICD-10-CM | POA: Insufficient documentation

## 2019-03-20 DIAGNOSIS — E782 Mixed hyperlipidemia: Secondary | ICD-10-CM | POA: Diagnosis not present

## 2019-03-20 DIAGNOSIS — N186 End stage renal disease: Secondary | ICD-10-CM | POA: Insufficient documentation

## 2019-03-20 DIAGNOSIS — Z992 Dependence on renal dialysis: Secondary | ICD-10-CM | POA: Diagnosis not present

## 2019-03-20 DIAGNOSIS — Z9861 Coronary angioplasty status: Secondary | ICD-10-CM | POA: Diagnosis not present

## 2019-03-20 DIAGNOSIS — E1122 Type 2 diabetes mellitus with diabetic chronic kidney disease: Secondary | ICD-10-CM | POA: Insufficient documentation

## 2019-03-20 DIAGNOSIS — Z79899 Other long term (current) drug therapy: Secondary | ICD-10-CM | POA: Insufficient documentation

## 2019-03-20 DIAGNOSIS — I5032 Chronic diastolic (congestive) heart failure: Secondary | ICD-10-CM | POA: Diagnosis not present

## 2019-03-20 DIAGNOSIS — K5901 Slow transit constipation: Secondary | ICD-10-CM | POA: Diagnosis not present

## 2019-03-20 DIAGNOSIS — K5900 Constipation, unspecified: Secondary | ICD-10-CM | POA: Diagnosis present

## 2019-03-20 MED ORDER — DOCUSATE SODIUM 100 MG PO CAPS
ORAL_CAPSULE | ORAL | Status: AC
  Filled 2019-03-20: qty 1

## 2019-03-20 MED ORDER — DOCUSATE SODIUM 100 MG PO CAPS
100.0000 mg | ORAL_CAPSULE | Freq: Once | ORAL | Status: AC
  Administered 2019-03-20: 100 mg via ORAL

## 2019-03-20 MED ORDER — DOCUSATE SODIUM 100 MG PO CAPS: 100.0000 mg | ORAL_CAPSULE | Freq: Once | ORAL | Status: DC

## 2019-03-20 MED ORDER — FENTANYL CITRATE (PF) 100 MCG/2ML IJ SOLN: 25.0000 ug | INTRAMUSCULAR | Status: DC | PRN

## 2019-03-20 MED ORDER — ACETAMINOPHEN 500 MG PO TABS
1000.0000 mg | ORAL_TABLET | Freq: Once | ORAL | Status: AC
  Administered 2019-03-20: 1000 mg via ORAL

## 2019-03-20 MED ORDER — ONDANSETRON HCL 4 MG/2ML IJ SOLN: 4.0000 mg | Freq: Once | INTRAMUSCULAR | Status: DC | PRN

## 2019-03-20 MED ORDER — POLYETHYLENE GLYCOL 3350 17 G PO PACK
17.0000 g | PACK | Freq: Every day | ORAL | Status: DC
  Filled 2019-03-20: qty 1

## 2019-03-20 MED ORDER — ACETAMINOPHEN 500 MG PO TABS
ORAL_TABLET | ORAL | Status: AC
  Filled 2019-03-20: qty 2

## 2019-03-20 MED ORDER — GOLYTELY 236 G PO SOLR: 4000.0000 mL | Freq: Once | ORAL | 0 refills | Status: AC

## 2019-03-20 MED FILL — GAVILYTE-G SOLUTION: 236 | 1 days supply | Qty: 4000 | Fill #0

## 2019-03-20 NOTE — Progress Notes (Signed)
Ms Brooke Dare denies chest pain or shortness of breath. Patient will have Covid test after dialysis today. Patient was instructed to stop Plavix 5 Days prior to surgery; patient will have the date she stopped din am, she is in dialysis and does not have the date with her.  Ms Laningham has type II diabetes. Patient reports that CBG's run 120's. Last A1C was 6.8 at the dialysis Center. I instructed patient to not take pills for diabetes the morning of surgery.I instructed patient to check CBG after awaking and every 2 hours until arrival  to the hospital.  I Instructed patient if CBG is less than 70 to drink 1/2 cup of a clear juice. Recheck CBG in 15 minutes then call pre- op desk at [phone] for further instructions.

## 2019-03-20 NOTE — ED Provider Notes (Signed)
[facility] EMERGENCY DEPARTMENT Provider Note   CSN: [id number] Arrival date & time: 03/20/19  1127     History Chief Complaint  Patient presents with  . Constipation, recheck    Rebekah Johnson is a 59 y.o. female with a past medical history of ESRD on dialysis, CAD, CHF, CKD, diabetes, hypertension presenting to the ED with a chief complaint of constipation.  Patient was seen and evaluated on 03/17/2019 for similar complaints.  At that time she refused digital disimpaction or enema.  Patient was prescribed GoLYTELY which she did not pick up from her pharmacy and returns today.  Patient is unsure why she did not pick up this medication from the pharmacy and stated that they only gave her amlodipine.  Patient unsure if this was not in stock or any other explanation about why she not pick up the medicine.  She had one bowel movement when she left the ED on 3/12 but has not had a bowel movement since then.  Denies any nausea, vomiting but reports continued rectal pain.  Denies any fever, urinary symptoms.  HPI     Past Medical History:  Diagnosis Date  . Acute congestive heart failure ([place])   . Acute diastolic CHF (congestive heart failure) ([place]) 09/14/2016  . Atrophic vaginitis 06/07/2012  . Avitaminosis D 07/28/2012  . CAD (coronary artery disease)    a. NSTEM 09/2016: cath showing severe diffuse disease of the RCA, ramus and Cx with mild-mod disease of LAD; PCI would require multiple stents and significant contrast usage thus medical therapy recommended.  . Cellulitis 07/12/2015  . Chest pain 01/01/2017  . Chronic diastolic CHF (congestive heart failure) ([place])   . Chronic kidney disease 09/23/2016  . CKD (chronic kidney disease), stage IV ([place])   . Constipation   . COPD GOLD  0 08/31/2017   Quit smoking 07/17/17 - Spirometry 09/01/2017  FEV1 1.23 (58%)  Ratio 57 with mild curvature p no rx - 09/01/2017  After extensive coaching inhaler device,  effectiveness =    75% with elipta  with cough provoked  - PFT's  10/14/2017  FEV1 1.69 (68 % ) ratio 84  p no % improvement from saba p nothing prior to study with DLCO  50 % corrects to 76  % for alv volume   - 10/14/2017  Walked RA x 3 laps @ 1  . Current tobacco use 06/10/2012  . Diabetes mellitus with neuropathy ([place])   . Diabetic foot ulcer ([place]) 07/13/2015  . Diabetic polyneuropathy associated with type 2 diabetes mellitus ([place]) 08/19/2017  . DOE (dyspnea on exertion) 09/01/2017   09/01/2017  Walked RA x 3 laps @ 185 ft each stopped due to  End of study, nl to mod fast  pace, no  desat   But stopped to rest x 2  Spirometry 09/01/2017  FEV1 1.23 (58%)  Ratio 57  > trial of anoro    . Dyspnea    with exertion  . ESRD (end stage renal disease) ([place])    MWF - [facility]  . Essential (primary) hypertension 06/10/2012  . Foot amputation status    a. h/o left foot transmetatarsal amputation  . Headache    Migraines  . High cholesterol   . History of biliary T-tube placement 06/10/2012   Patient unaware   . History of cardiac arrest   . HLD (hyperlipidemia) 09/02/2012   Overview:  ICD-10 cut over    . Hypertension    a. patent renal arteries  by PV angio 08/2015. b. heavy proteinuria 09/2016 ? nephrotic.  [name] Hypertensive heart disease with heart failure ([place])   . Hypertriglyceridemia 07/28/2012  . Leg pain 06/10/2012  . Normocytic anemia   . NSTEMI (non-ST elevated myocardial infarction) ([place]) 01/01/2017  . Obesity   . Onychomycosis 12/24/2015  . Peripheral nerve disease 09/02/2012  . Peripheral vascular disease ([place]) 07/28/2012  . Pneumonia 2018  . Proteinuria   . PVD (peripheral vascular disease) ([place])    a. status post left SFA and popliteal stent and left SFA PTA with drug coated balloon in 08/2015.  . Status post transmetatarsal amputation of foot, left ([place]) 09/06/2015  . Tobacco abuse   . Type 2 diabetes mellitus ([place]) 06/07/2012  . Type 2 diabetes mellitus with peripheral angiopathy ([place]) 09/02/2012    Patient Active Problem  List   Diagnosis Date Noted  . Multinodular goiter 08/31/2018  . Cardiac arrest ([place]) 11/22/2017  . DOE (dyspnea on exertion) 09/01/2017  . COPD GOLD  0 08/31/2017  . Diabetic polyneuropathy associated with type 2 diabetes mellitus ([place]) 08/19/2017  . NSTEMI (non-ST elevated myocardial infarction) ([place]) 01/01/2017  . Chest pain 01/01/2017  . Tobacco abuse   . PVD (peripheral vascular disease) ([place])   . Proteinuria   . Obesity   . Normocytic anemia   . Hypertension   . High cholesterol   . Foot amputation status   . Diabetes mellitus with neuropathy ([place])   . CKD (chronic kidney disease), stage IV ([place])   . Chronic diastolic CHF (congestive heart failure) ([place])   . CAD (coronary artery disease) 10/27/2016  . Chronic kidney disease 09/23/2016  . Acute congestive heart failure ([place])   . Hypertensive heart disease with heart failure ([place])   . Chronic combined systolic and diastolic heart failure ([place]) 09/14/2016  . Onychomycosis 12/24/2015  . Status post transmetatarsal amputation of foot, left ([place]) 09/06/2015  . Diabetic foot ulcer ([place]) 07/13/2015  . Cellulitis 07/12/2015  . HLD (hyperlipidemia) 09/02/2012  . Peripheral nerve disease 09/02/2012  . Type 2 diabetes mellitus with peripheral angiopathy ([place]) 09/02/2012  . Hypertriglyceridemia 07/28/2012  . Peripheral vascular disease ([place]) 07/28/2012  . Avitaminosis D 07/28/2012  . Essential (primary) hypertension 06/10/2012  . History of biliary T-tube placement 06/10/2012  . Leg pain 06/10/2012  . Current tobacco use 06/10/2012  . Atrophic vaginitis 06/07/2012  . Type 2 diabetes mellitus ([place]) 06/07/2012    Past Surgical History:  Procedure Laterality Date  . ABDOMINAL HYSTERECTOMY    . AMPUTATION Left 09/06/2015   Procedure: LEFT TRANSMETATARSAL AMPUTATTION;  Surgeon: Newt Minion, MD;  Location: [facility];  Service: Orthopedics;  Laterality: Left;  . AV FISTULA PLACEMENT Left 12/13/2018   Procedure: ARTERIOVENOUS (AV)  FISTULA CREATION LEFT UPPER ARM;  Surgeon: Waynetta Sandy, MD;  Location: [facility];  Service: Vascular;  Laterality: Left;  . COLONOSCOPY     polyps x 1  . LEFT HEART CATH AND CORONARY ANGIOGRAPHY N/A 09/17/2016   Procedure: LEFT HEART CATH AND CORONARY ANGIOGRAPHY;  Surgeon: Jettie Booze, MD;  Location: [facility];  Service: Cardiovascular;  Laterality: N/A;  . PERIPHERAL VASCULAR CATHETERIZATION N/A 08/16/2015   Procedure: Abdominal Aortogram;  Surgeon: Elam Dutch, MD;  Location: [facility];  Service: Cardiovascular;  Laterality: N/A;  . PERIPHERAL VASCULAR CATHETERIZATION Bilateral 08/16/2015   Procedure: Lower Extremity Angiography;  Surgeon: Elam Dutch, MD;  Location: [facility];  Service: Cardiovascular;  Laterality: Bilateral;  . PERIPHERAL  VASCULAR CATHETERIZATION Left 08/16/2015   Procedure: Peripheral Vascular Intervention;  Surgeon: Elam Dutch, MD;  Location: [facility];  Service: Cardiovascular;  Laterality: Left;  SFA STENT X 2     OB History   No obstetric history on file.     Family History  Problem Relation Age of Onset  . Diabetes Mother   . Hypertension Father     Social History   Tobacco Use  . Smoking status: Former Smoker    Packs/day: 1.00    Years: 42.00    Pack years: 42.00    Types: Cigarettes    Quit date: 07/17/2017    Years since quitting: 1.6  . Smokeless tobacco: Never Used  Substance Use Topics  . Alcohol use: Not Currently    Alcohol/week: 1.0 - 2.0 standard drinks    Types: 1 - 2 Glasses of wine per week    Comment: on social occassions  . Drug use: Yes    Frequency: 7.0 times per week    Types: Marijuana    Comment: day    Home Medications Prior to Admission medications   Medication Sig Start Date End Date Taking? Authorizing Provider  amLODipine (NORVASC) 5 MG tablet Take 5 mg by mouth daily.   Yes [provider]  Ascorbic Acid (VITAMIN C) 1000 MG tablet Take  1,000 mg by mouth daily.   Yes [provider]  aspirin 81 MG EC tablet Take 1 tablet (81 mg total) by mouth daily. Reported on 07/13/2015 07/02/16  Yes Charlott Rakes, MD  atorvastatin (LIPITOR) 80 MG tablet Take 1 tablet (80 mg total) by mouth daily. 10/07/17  Yes Scot Jun, FNP  carvedilol (COREG) 25 MG tablet Take 1 tablet (25 mg total) by mouth 2 (two) times daily. 10/07/17  Yes Scot Jun, FNP  cholecalciferol (VITAMIN D3) 25 MCG (1000 UT) tablet Take 1,000 Units by mouth daily.   Yes [provider]  clopidogrel (PLAVIX) 75 MG tablet Take 1 tablet (75 mg total) by mouth daily. 10/07/17  Yes Scot Jun, FNP  docusate sodium (COLACE) 100 MG capsule Take 200 mg by mouth 2 (two) times daily.    Yes [provider]  furosemide (LASIX) 80 MG tablet Take 80 mg by mouth 2 (two) times daily. 03/21/18  Yes [provider]  gabapentin (NEURONTIN) 300 MG capsule Take 1 capsule (300 mg total) by mouth 3 (three) times daily. 10/07/17  Yes Scot Jun, FNP  glipiZIDE (GLUCOTROL) 5 MG tablet Take 0.5 tablets (2.5 mg total) by mouth daily before breakfast. Patient taking differently: Take 5 mg by mouth daily before breakfast.  11/01/18  Yes Renato Shin, MD  hydrALAZINE (APRESOLINE) 50 MG tablet Take 50 mg by mouth 3 (three) times daily.   Yes [provider]  isosorbide mononitrate (IMDUR) 30 MG 24 hr tablet Take 60 mg by mouth daily. 03/22/18  Yes [provider]  latanoprost (XALATAN) 0.005 % ophthalmic solution Place 1 drop into both eyes at bedtime.    Yes [provider]  ranolazine (RANEXA) 500 MG 12 hr tablet Take 1 tablet (500 mg total) by mouth 2 (two) times daily. 02/14/18  Yes Dorothy Spark, MD  sitaGLIPtin (JANUVIA) 25 MG tablet Take 1 tablet (25 mg total) by mouth daily. 10/07/17  Yes Scot Jun, FNP  acetaminophen (TYLENOL) 500 MG tablet Take 1,000 mg by mouth every 6 (six) hours as needed for  moderate pain or headache.  [provider]  Blood Glucose Monitoring Suppl (ACCU-CHEK AVIVA) device Use as instructed three times daily. Patient taking differently: Use as instructed twice times daily. 04/22/16   Charlott Rakes, MD  glucose blood (ACCU-CHEK AVIVA) test strip Use as instructed three times daily before meals. 10/14/17   Charlott Rakes, MD  Lancet Devices [facility]) lancets Use as instructed three times daily before meals. 04/22/16   Charlott Rakes, MD  nitroGLYCERIN (NITROSTAT) 0.4 MG SL tablet Place 1 tablet (0.4 mg total) under the tongue every 5 (five) minutes as needed for chest pain. 01/18/17 02/14/19  Dorothy Spark, MD  Omega-3 Fatty Acids (FISH OIL) 1000 MG CAPS Take 1,000 mg by mouth daily.     [provider]  polyethylene glycol (GOLYTELY) 236 g solution Take 4,000 mLs by mouth once for 1 dose. 03/20/19 03/20/19  Burdell Peed, PA-C  silver sulfADIAZINE (SILVADENE) 1 % cream Apply 1 application topically daily. Apply to affected area daily plus dry dressing Patient taking differently: Apply 1 application topically daily as needed (wound care).  07/11/18   Newt Minion, MD    Allergies    Patient has no known allergies.  Review of Systems   Review of Systems  Constitutional: Negative for appetite change, chills and fever.  HENT: Negative for ear pain, rhinorrhea, sneezing and sore throat.   Eyes: Negative for photophobia and visual disturbance.  Respiratory: Negative for cough, chest tightness, shortness of breath and wheezing.   Cardiovascular: Negative for chest pain and palpitations.  Gastrointestinal: Positive for constipation and rectal pain. Negative for abdominal pain, blood in stool, diarrhea, nausea and vomiting.  Genitourinary: Negative for dysuria, hematuria and urgency.  Musculoskeletal: Negative for myalgias.  Skin: Negative for rash.  Neurological: Negative for dizziness, weakness and light-headedness.    Physical Exam  Updated Vital Signs BP (!) 157/78 (BP Location: Right Arm)   Pulse 90   Temp 98 F (36.7 C) (Oral)   Resp 20   Ht 5\' 4"  (1.626 m)   Wt 76.1 kg   SpO2 100%   BMI 28.79 kg/m   Physical Exam Vitals and nursing note reviewed.  Constitutional:      General: She is not in acute distress.    Appearance: She is well-developed.  HENT:     Head: Normocephalic and atraumatic.     Nose: Nose normal.  Eyes:     General: No scleral icterus.       Left eye: No discharge.     Conjunctiva/sclera: Conjunctivae normal.  Cardiovascular:     Rate and Rhythm: Normal rate and regular rhythm.     Heart sounds: Normal heart sounds. No murmur. No friction rub. No gallop.   Pulmonary:     Effort: Pulmonary effort is normal. No respiratory distress.     Breath sounds: Normal breath sounds.  Abdominal:     General: Bowel sounds are normal. There is no distension.     Palpations: Abdomen is soft.     Tenderness: There is no abdominal tenderness. There is no guarding.  Musculoskeletal:        General: Normal range of motion.     Cervical back: Normal range of motion and neck supple.  Skin:    General: Skin is warm and dry.     Findings: No rash.  Neurological:     Mental Status: She is alert.     Motor: No abnormal muscle tone.     Coordination: Coordination normal.     ED  Results / Procedures / Treatments   Labs (all labs ordered are listed, but only abnormal results are displayed) Labs Reviewed - No data to display  EKG None  Radiology No results found.  Procedures Procedures (including critical care time)  Medications Ordered in ED Medications  docusate sodium (COLACE) capsule 100 mg (100 mg Oral Given 03/20/19 1238)    ED Course  I have reviewed the triage vital signs and the nursing notes.  Pertinent labs & imaging results that were available during my care of the patient were reviewed by me and considered in my medical decision making (see chart for details).    MDM  Rules/Calculators/A&P                      59 year old female presents to ED again for constipation.  She was seen 3 days ago, offered digital disimpaction as well as enema, both of which she refused due to pain.  She was prescribed GoLYTELY but did not pick this up from the pharmacy and has not tried any other medications to help with constipation.  She reports ongoing rectal pain.  I had a discussion with the patient regarding her symptoms and the medications that can help her.  I do not think that she will benefit from another attempt at disimpaction or enema as she continues rectal pain and discomfort and do not feel that she will tolerate this.  I also explained to patient that this medication that she was prescribed will help with cleaning out her bowels and will not give her more rectal discomfort as would disimpaction or enema.  Told her that ultimately she may need to be on a bowel regimen such as MiraLAX daily but in the meantime I think the GoLYTELY will be most beneficial to her.  She is requesting MiraLAX in the ER "until I can go to the pharmacy and get it filled."  We do not have this here but I ordered her some Colace.  She also requesting pain medicine which I explained to her would make her constipation worse.  I called the [facility] outpatient pharmacy myself and they have this medication, which I informed patient about.  I reviewed x-ray from last week's visit which did show stool.  Patient is hemodynamically stable, in NAD, and able to ambulate in the ED. Evaluation does not show pathology that would require ongoing emergent intervention or inpatient treatment. I have personally reviewed and interpreted all lab work and imaging at today's ED visit. I explained the diagnosis to the patient. Pain has been managed and has no complaints prior to discharge. Patient is comfortable with above plan and is stable for discharge at this time. All questions were answered prior to disposition. Strict  return precautions for returning to the ED were discussed. Encouraged follow up with PCP.   An After Visit Summary was printed and given to the patient.   Portions of this note were generated with [employer]. Dictation errors may occur despite best attempts at proofreading.  Final Clinical Impression(s) / ED Diagnoses Final diagnoses:  Slow transit constipation    Rx / DC Orders ED Discharge Orders         Ordered    polyethylene glycol (GOLYTELY) 236 g solution   Once     03/20/19 1207           Delia Heady, PA-C 03/20/19 [place], Ankit, MD 03/20/19 1441

## 2019-03-20 NOTE — ED Notes (Signed)
Wheeled patient to the pharmacy to pick up her prescription.  I explained to her as to how to mix the Golytely and water.  Patient is still crying saying that she is still hurting.  Tylenol was given prior to discharge.

## 2019-03-20 NOTE — Discharge Instructions (Addendum)
It is important for you to take the GoLYTELY as this will help move your bowels. I have called the pharmacy and have confirmed that this medicine is there. Follow the instructions on the bottle. Return to the ER if you start to develop a fever, vomiting, chest pain or shortness of breath.

## 2019-03-20 NOTE — ED Triage Notes (Signed)
Patient was here 03/17/2019 for the same complaint.  Had 1 bowel movement last Friday.

## 2019-03-21 ENCOUNTER — Encounter (HOSPITAL_COMMUNITY)
Admission: RE | Disposition: A | Discharge status: Home / Self Care | Payer: Self-pay | Source: Home / Self Care | Attending: Emergency Medicine

## 2019-03-21 ENCOUNTER — Other Ambulatory Visit (HOSPITAL_COMMUNITY): Payer: Medicare Other

## 2019-03-21 ENCOUNTER — Ambulatory Visit (HOSPITAL_COMMUNITY): Payer: Medicare Other

## 2019-03-21 ENCOUNTER — Ambulatory Visit (HOSPITAL_COMMUNITY): Payer: Medicare Other | Admitting: Certified Registered"

## 2019-03-21 ENCOUNTER — Ambulatory Visit (HOSPITAL_COMMUNITY)
Admission: RE | Admit: 2019-03-21 | Discharge: 2019-03-21 | Disposition: A | Discharge status: Home / Self Care | Payer: Medicare Other | Attending: Emergency Medicine | Admitting: Emergency Medicine

## 2019-03-21 ENCOUNTER — Encounter (HOSPITAL_COMMUNITY): Payer: Self-pay | Admitting: Vascular Surgery

## 2019-03-21 ENCOUNTER — Other Ambulatory Visit: Payer: Self-pay

## 2019-03-21 DIAGNOSIS — Z4901 Encounter for fitting and adjustment of extracorporeal dialysis catheter: Secondary | ICD-10-CM | POA: Insufficient documentation

## 2019-03-21 DIAGNOSIS — I2510 Atherosclerotic heart disease of native coronary artery without angina pectoris: Secondary | ICD-10-CM | POA: Insufficient documentation

## 2019-03-21 DIAGNOSIS — Z538 Procedure and treatment not carried out for other reasons: Secondary | ICD-10-CM | POA: Diagnosis present

## 2019-03-21 DIAGNOSIS — R1031 Right lower quadrant pain: Secondary | ICD-10-CM | POA: Insufficient documentation

## 2019-03-21 DIAGNOSIS — E1122 Type 2 diabetes mellitus with diabetic chronic kidney disease: Secondary | ICD-10-CM | POA: Insufficient documentation

## 2019-03-21 DIAGNOSIS — E049 Nontoxic goiter, unspecified: Secondary | ICD-10-CM | POA: Insufficient documentation

## 2019-03-21 DIAGNOSIS — Z7984 Long term (current) use of oral hypoglycemic drugs: Secondary | ICD-10-CM | POA: Insufficient documentation

## 2019-03-21 DIAGNOSIS — Z79890 Hormone replacement therapy (postmenopausal): Secondary | ICD-10-CM | POA: Insufficient documentation

## 2019-03-21 DIAGNOSIS — Z87891 Personal history of nicotine dependence: Secondary | ICD-10-CM | POA: Diagnosis not present

## 2019-03-21 DIAGNOSIS — I132 Hypertensive heart and chronic kidney disease with heart failure and with stage 5 chronic kidney disease, or end stage renal disease: Secondary | ICD-10-CM | POA: Insufficient documentation

## 2019-03-21 DIAGNOSIS — E1151 Type 2 diabetes mellitus with diabetic peripheral angiopathy without gangrene: Secondary | ICD-10-CM | POA: Insufficient documentation

## 2019-03-21 DIAGNOSIS — J449 Chronic obstructive pulmonary disease, unspecified: Secondary | ICD-10-CM | POA: Insufficient documentation

## 2019-03-21 DIAGNOSIS — Z7902 Long term (current) use of antithrombotics/antiplatelets: Secondary | ICD-10-CM | POA: Diagnosis not present

## 2019-03-21 DIAGNOSIS — E669 Obesity, unspecified: Secondary | ICD-10-CM | POA: Diagnosis not present

## 2019-03-21 DIAGNOSIS — E1140 Type 2 diabetes mellitus with diabetic neuropathy, unspecified: Secondary | ICD-10-CM | POA: Diagnosis not present

## 2019-03-21 DIAGNOSIS — Z7982 Long term (current) use of aspirin: Secondary | ICD-10-CM | POA: Insufficient documentation

## 2019-03-21 DIAGNOSIS — E785 Hyperlipidemia, unspecified: Secondary | ICD-10-CM | POA: Diagnosis not present

## 2019-03-21 DIAGNOSIS — Z89432 Acquired absence of left foot: Secondary | ICD-10-CM | POA: Diagnosis not present

## 2019-03-21 DIAGNOSIS — K5900 Constipation, unspecified: Secondary | ICD-10-CM | POA: Diagnosis not present

## 2019-03-21 DIAGNOSIS — K644 Residual hemorrhoidal skin tags: Secondary | ICD-10-CM | POA: Diagnosis not present

## 2019-03-21 DIAGNOSIS — R109 Unspecified abdominal pain: Secondary | ICD-10-CM

## 2019-03-21 DIAGNOSIS — Z79899 Other long term (current) drug therapy: Secondary | ICD-10-CM | POA: Insufficient documentation

## 2019-03-21 DIAGNOSIS — I5032 Chronic diastolic (congestive) heart failure: Secondary | ICD-10-CM | POA: Diagnosis not present

## 2019-03-21 DIAGNOSIS — Z20822 Contact with and (suspected) exposure to covid-19: Secondary | ICD-10-CM | POA: Insufficient documentation

## 2019-03-21 DIAGNOSIS — K828 Other specified diseases of gallbladder: Secondary | ICD-10-CM | POA: Insufficient documentation

## 2019-03-21 DIAGNOSIS — Z8674 Personal history of sudden cardiac arrest: Secondary | ICD-10-CM | POA: Insufficient documentation

## 2019-03-21 DIAGNOSIS — N186 End stage renal disease: Secondary | ICD-10-CM | POA: Insufficient documentation

## 2019-03-21 DIAGNOSIS — E7800 Pure hypercholesterolemia, unspecified: Secondary | ICD-10-CM | POA: Insufficient documentation

## 2019-03-21 DIAGNOSIS — I252 Old myocardial infarction: Secondary | ICD-10-CM | POA: Diagnosis not present

## 2019-03-21 LAB — CBC WITH DIFFERENTIAL/PLATELET
Abs Immature Granulocytes: 0.07 10*3/uL (ref 0.00–0.07)
Basophils Absolute: 0 10*3/uL (ref 0.0–0.1)
Basophils Relative: 0 %
Eosinophils Absolute: 0.1 10*3/uL (ref 0.0–0.5)
Eosinophils Relative: 1 %
HCT: 38.6 % (ref 36.0–46.0)
Hemoglobin: 12.1 g/dL (ref 12.0–15.0)
Immature Granulocytes: 1 %
Lymphocytes Relative: 23 %
Lymphs Abs: 2.9 10*3/uL (ref 0.7–4.0)
MCH: 28.7 pg (ref 26.0–34.0)
MCHC: 31.3 g/dL (ref 30.0–36.0)
MCV: 91.5 fL (ref 80.0–100.0)
Monocytes Absolute: 0.9 10*3/uL (ref 0.1–1.0)
Monocytes Relative: 7 %
Neutro Abs: 8.9 10*3/uL — ABNORMAL HIGH (ref 1.7–7.7)
Neutrophils Relative %: 68 %
Platelets: 265 10*3/uL (ref 150–400)
RBC: 4.22 MIL/uL (ref 3.87–5.11)
RDW: 14.1 % (ref 11.5–15.5)
WBC: 12.9 10*3/uL — ABNORMAL HIGH (ref 4.0–10.5)
nRBC: 0 % (ref 0.0–0.2)

## 2019-03-21 LAB — POCT I-STAT, CHEM 8
BUN: 27 mg/dL — ABNORMAL HIGH (ref 8–23)
Calcium, Ion: 0.99 mmol/L — ABNORMAL LOW (ref 1.15–1.40)
Chloride: 93 mmol/L — ABNORMAL LOW (ref 98–111)
Creatinine, Ser: 3.8 mg/dL — ABNORMAL HIGH (ref 0.44–1.00)
Glucose, Bld: 180 mg/dL — ABNORMAL HIGH (ref 70–99)
HCT: 40 % (ref 36.0–46.0)
Hemoglobin: 13.6 g/dL (ref 12.0–15.0)
Potassium: 3.9 mmol/L (ref 3.5–5.1)
Sodium: 135 mmol/L (ref 135–145)
TCO2: 34 mmol/L — ABNORMAL HIGH (ref 22–32)

## 2019-03-21 LAB — COMPREHENSIVE METABOLIC PANEL
ALT: 38 U/L (ref 0–44)
AST: 35 U/L (ref 15–41)
Albumin: 3.6 g/dL (ref 3.5–5.0)
Alkaline Phosphatase: 117 U/L (ref 38–126)
Anion gap: 14 (ref 5–15)
BUN: 27 mg/dL — ABNORMAL HIGH (ref 8–23)
CO2: 30 mmol/L (ref 22–32)
Calcium: 9.1 mg/dL (ref 8.9–10.3)
Chloride: 94 mmol/L — ABNORMAL LOW (ref 98–111)
Creatinine, Ser: 4.02 mg/dL — ABNORMAL HIGH (ref 0.44–1.00)
GFR calc Af Amer: 13 mL/min — ABNORMAL LOW (ref >60)
GFR calc non Af Amer: 11 mL/min — ABNORMAL LOW (ref >60)
Glucose, Bld: 139 mg/dL — ABNORMAL HIGH (ref 70–99)
Potassium: 4.1 mmol/L (ref 3.5–5.1)
Sodium: 138 mmol/L (ref 135–145)
Total Bilirubin: 0.6 mg/dL (ref 0.3–1.2)
Total Protein: 7.7 g/dL (ref 6.5–8.1)

## 2019-03-21 LAB — URINALYSIS, ROUTINE W REFLEX MICROSCOPIC
Bilirubin Urine: NEGATIVE
Glucose, UA: NEGATIVE mg/dL
Hgb urine dipstick: NEGATIVE
Ketones, ur: NEGATIVE mg/dL
Leukocytes,Ua: NEGATIVE
Nitrite: NEGATIVE
Protein, ur: >=300 mg/dL — AB
Specific Gravity, Urine: 1.015 (ref 1.005–1.030)
pH: 8 (ref 5.0–8.0)

## 2019-03-21 LAB — RESPIRATORY PANEL BY RT PCR (FLU A&B, COVID)
Influenza A by PCR: NEGATIVE
Influenza B by PCR: NEGATIVE
SARS Coronavirus 2 by RT PCR: NEGATIVE

## 2019-03-21 LAB — LIPASE, BLOOD: Lipase: 82 U/L — ABNORMAL HIGH (ref 11–51)

## 2019-03-21 SURGERY — REVISON OF ARTERIOVENOUS FISTULA
Anesthesia: General | Laterality: Left

## 2019-03-21 MED ORDER — SODIUM CHLORIDE 0.9 % IV SOLN: INTRAVENOUS | Status: DC

## 2019-03-21 MED ORDER — CHLORHEXIDINE GLUCONATE 4 % EX LIQD: 60.0000 mL | Freq: Once | CUTANEOUS | Status: DC

## 2019-03-21 MED ORDER — SUCCINYLCHOLINE CHLORIDE 200 MG/10ML IV SOSY
PREFILLED_SYRINGE | INTRAVENOUS | Status: AC
  Filled 2019-03-21: qty 10

## 2019-03-21 MED ORDER — ACETAMINOPHEN 500 MG PO TABS
ORAL_TABLET | ORAL | Status: AC
  Administered 2019-03-21: 1000 mg via ORAL
  Filled 2019-03-21: qty 2

## 2019-03-21 MED ORDER — HYDROMORPHONE HCL 1 MG/ML IJ SOLN
1.0000 mg | Freq: Once | INTRAMUSCULAR | Status: AC
  Administered 2019-03-21: 1 mg via INTRAVENOUS
  Filled 2019-03-21: qty 1

## 2019-03-21 MED ORDER — LIDOCAINE 2% (20 MG/ML) 5 ML SYRINGE
INTRAMUSCULAR | Status: AC
  Filled 2019-03-21: qty 5

## 2019-03-21 MED ORDER — BISACODYL 10 MG RE SUPP: 10.0000 mg | RECTAL | 0 refills | Status: DC | PRN

## 2019-03-21 MED ORDER — PHENYLEPHRINE 40 MCG/ML (10ML) SYRINGE FOR IV PUSH (FOR BLOOD PRESSURE SUPPORT)
PREFILLED_SYRINGE | INTRAVENOUS | Status: AC
  Filled 2019-03-21: qty 10

## 2019-03-21 MED ORDER — CEFAZOLIN SODIUM-DEXTROSE 2-4 GM/100ML-% IV SOLN: 2.0000 g | INTRAVENOUS | Status: DC

## 2019-03-21 MED ORDER — SODIUM CHLORIDE 0.9 % IV SOLN
INTRAVENOUS | Status: AC
  Filled 2019-03-21: qty 1.2

## 2019-03-21 MED ORDER — FENTANYL CITRATE (PF) 250 MCG/5ML IJ SOLN
INTRAMUSCULAR | Status: AC
  Filled 2019-03-21: qty 5

## 2019-03-21 MED ORDER — MIDAZOLAM HCL 2 MG/2ML IJ SOLN
INTRAMUSCULAR | Status: AC
  Filled 2019-03-21: qty 2

## 2019-03-21 MED ORDER — EPHEDRINE 5 MG/ML INJ
INTRAVENOUS | Status: AC
  Filled 2019-03-21: qty 10

## 2019-03-21 MED ORDER — ACETAMINOPHEN 500 MG PO TABS: 1000.0000 mg | ORAL_TABLET | Freq: Once | ORAL | Status: AC

## 2019-03-21 MED ORDER — LIDOCAINE HCL URETHRAL/MUCOSAL 2 % EX GEL
1.0000 "application " | Freq: Once | CUTANEOUS | Status: AC
  Administered 2019-03-21: 1 via TOPICAL
  Filled 2019-03-21: qty 20

## 2019-03-21 MED ORDER — PHENYLEPHRINE IN HARD FAT 0.25 % RE SUPP
1.0000 | Freq: Once | RECTAL | Status: AC
  Administered 2019-03-21: 1 via RECTAL
  Filled 2019-03-21: qty 1

## 2019-03-21 MED ORDER — PROPOFOL 10 MG/ML IV BOLUS
INTRAVENOUS | Status: AC
  Filled 2019-03-21: qty 20

## 2019-03-21 MED ORDER — LIDOCAINE-EPINEPHRINE 1 %-1:100000 IJ SOLN
INTRAMUSCULAR | Status: AC
  Filled 2019-03-21: qty 1

## 2019-03-21 MED ORDER — LACTULOSE 10 GM/15ML PO SOLN: 10.0000 g | Freq: Three times a day (TID) | ORAL | 0 refills | Status: DC

## 2019-03-21 NOTE — Discharge Instructions (Addendum)
Return here as needed. Follow up with your doctor. Your CT scan shows you have a fair amount of constipation. Your US shows no significant issues with your gall bladder

## 2019-03-21 NOTE — Anesthesia Preprocedure Evaluation (Deleted)
Anesthesia Evaluation  Patient identified by MRN, date of birth, ID band Patient awake    Reviewed: Allergy & Precautions, NPO status , Patient's Chart, lab work & pertinent test results  Airway Mallampati: II  TM Distance: >3 FB Neck ROM: Full    Dental  (+) Teeth Intact, Dental Advisory Given   Pulmonary COPD, former smoker,    Pulmonary exam normal breath sounds clear to auscultation       Cardiovascular hypertension, Pt. on home beta blockers + CAD, + Past MI, + Peripheral Vascular Disease, +CHF and + DOE  Normal cardiovascular exam Rhythm:Regular Rate:Normal     Neuro/Psych  Headaches,  Neuromuscular disease negative psych ROS   GI/Hepatic negative GI ROS, (+)     substance abuse  marijuana use,   Endo/Other  diabetes, Type 2  Renal/GU ESRF and DialysisRenal disease (MWF)     Musculoskeletal negative musculoskeletal ROS (+)   Abdominal   Peds  Hematology negative hematology ROS (+)   Anesthesia Other Findings Day of surgery medications reviewed with the patient.  Reproductive/Obstetrics                            Anesthesia Physical Anesthesia Plan  ASA: III  Anesthesia Plan: General   Post-op Pain Management:    Induction: Intravenous  PONV Risk Score and Plan: 3  Airway Management Planned: LMA  Additional Equipment:   Intra-op Plan:   Post-operative Plan: Extubation in OR  Informed Consent: I have reviewed the patients History and Physical, chart, labs and discussed the procedure including the risks, benefits and alternatives for the proposed anesthesia with the patient or authorized representative who has indicated his/her understanding and acceptance.     Dental advisory given  Plan Discussed with: CRNA  Anesthesia Plan Comments:         Anesthesia Quick Evaluation

## 2019-03-21 NOTE — ED Notes (Signed)
Help get patient up to the bedside toilet patient has call bell in reach

## 2019-03-21 NOTE — H&P (Addendum)
   History and Physical Update  The patient was interviewed and re-examined.  The patient's previous History and Physical has been reviewed and is unchanged from recent office visit. Plan for revision of avf today.   Rebekah Johnson C. Donzetta Matters, MD [facility] Office: [phone] Pager: [phone]  03/21/2019, 7:59 AM  Patient was noted to have severe abdominal pain and was transferred to the emergency department for further evaluation.  Fistula revision can be performed on elective basis when her current condition has resolved.  Servando Snare

## 2019-03-21 NOTE — ED Provider Notes (Signed)
[facility] EMERGENCY DEPARTMENT Provider Note   CSN: [id number] Arrival date & time: 03/21/19  1137     History Chief Complaint  Patient presents with  . Constipation    Rebekah Johnson is a 59 y.o. female with history of CHF, CAD, CKD on dialysis Monday Wednesday Friday, COPD, diabetes mellitus, PVD presents for evaluation of acute onset, progressively worsening constipation for 6 days.  She was seen and evaluated at [facility] emergency department on 03/17/2019 and plain films at the time showed evidence of constipation.  She refused digital rectal examination/manual disimpaction or enema.  She was discharged with a prescription for GoLYTELY but did not pick this up.  She was seen at [facility] yesterday again and refused any efforts for management of her constipation in the ED.  She states that she did pick up the GoLYTELY at the [facility] yesterday and has been drinking it.  Reports that she has severe rectal pain with attempts to have a bowel movement and is only been able to pass watery stools since drinking the GoLYTELY.  Denies fevers, chest pain, shortness of breath.  She has been on dialysis for 6 months, reports compliance with her dialysis treatments and had a full treatment yesterday.  She still makes urine.  She was scheduled for an AV fistula procedure with Dr. Donzetta Matters today but reports that in the preop area she went to the bathroom to have a bowel movement and had worsening pain so she was sent down here for further evaluation and management.  She tells me that her pain is now also involving the right side of the abdomen, described as a sharp aching pain.   The history is provided by the patient.       Past Medical History:  Diagnosis Date  . Acute congestive heart failure ([place])   . Acute diastolic CHF (congestive heart failure) ([place]) 09/14/2016  . Atrophic vaginitis 06/07/2012  . Avitaminosis D 07/28/2012  . CAD  (coronary artery disease)    a. NSTEM 09/2016: cath showing severe diffuse disease of the RCA, ramus and Cx with mild-mod disease of LAD; PCI would require multiple stents and significant contrast usage thus medical therapy recommended.  . Cellulitis 07/12/2015  . Chest pain 01/01/2017  . Chronic diastolic CHF (congestive heart failure) ([place])   . Chronic kidney disease 09/23/2016  . CKD (chronic kidney disease), stage IV ([place])   . Constipation   . COPD GOLD  0 08/31/2017   Quit smoking 07/17/17 - Spirometry 09/01/2017  FEV1 1.23 (58%)  Ratio 57 with mild curvature p no rx - 09/01/2017  After extensive coaching inhaler device,  effectiveness =    75% with elipta with cough provoked  - PFT's  10/14/2017  FEV1 1.69 (68 % ) ratio 84  p no % improvement from saba p nothing prior to study with DLCO  50 % corrects to 76  % for alv volume   - 10/14/2017  Walked RA x 3 laps @ 1  . Current tobacco use 06/10/2012  . Diabetes mellitus with neuropathy ([place])   . Diabetic foot ulcer ([place]) 07/13/2015  . Diabetic polyneuropathy associated with type 2 diabetes mellitus ([place]) 08/19/2017  . DOE (dyspnea on exertion) 09/01/2017   09/01/2017  Walked RA x 3 laps @ 185 ft each stopped due to  End of study, nl to mod fast  pace, no  desat   But stopped to rest x  2  Spirometry 09/01/2017  FEV1 1.23 (58%)  Ratio 57  > trial of anoro    . Dyspnea    with exertion  . ESRD (end stage renal disease) ([place])    MWF - [facility]  . Essential (primary) hypertension 06/10/2012  . Foot amputation status    a. h/o left foot transmetatarsal amputation  . Headache    Migraines  . High cholesterol   . History of biliary T-tube placement 06/10/2012   Patient unaware   . History of cardiac arrest   . HLD (hyperlipidemia) 09/02/2012   Overview:  ICD-10 cut over    . Hypertension    a. patent renal arteries by PV angio 08/2015. b. heavy proteinuria 09/2016 ? nephrotic.  [name] Hypertensive heart disease with heart failure ([place])   . Hypertriglyceridemia  07/28/2012  . Leg pain 06/10/2012  . Normocytic anemia   . NSTEMI (non-ST elevated myocardial infarction) ([place]) 01/01/2017  . Obesity   . Onychomycosis 12/24/2015  . Peripheral nerve disease 09/02/2012  . Peripheral vascular disease ([place]) 07/28/2012  . Pneumonia 2018  . Proteinuria   . PVD (peripheral vascular disease) ([place])    a. status post left SFA and popliteal stent and left SFA PTA with drug coated balloon in 08/2015.  . Status post transmetatarsal amputation of foot, left ([place]) 09/06/2015  . Tobacco abuse   . Type 2 diabetes mellitus ([place]) 06/07/2012  . Type 2 diabetes mellitus with peripheral angiopathy ([place]) 09/02/2012    Patient Active Problem List   Diagnosis Date Noted  . Multinodular goiter 08/31/2018  . Cardiac arrest ([place]) 11/22/2017  . DOE (dyspnea on exertion) 09/01/2017  . COPD GOLD  0 08/31/2017  . Diabetic polyneuropathy associated with type 2 diabetes mellitus ([place]) 08/19/2017  . NSTEMI (non-ST elevated myocardial infarction) ([place]) 01/01/2017  . Chest pain 01/01/2017  . Tobacco abuse   . PVD (peripheral vascular disease) ([place])   . Proteinuria   . Obesity   . Normocytic anemia   . Hypertension   . High cholesterol   . Foot amputation status   . Diabetes mellitus with neuropathy ([place])   . CKD (chronic kidney disease), stage IV ([place])   . Chronic diastolic CHF (congestive heart failure) ([place])   . CAD (coronary artery disease) 10/27/2016  . Chronic kidney disease 09/23/2016  . Acute congestive heart failure ([place])   . Hypertensive heart disease with heart failure ([place])   . Chronic combined systolic and diastolic heart failure ([place]) 09/14/2016  . Onychomycosis 12/24/2015  . Status post transmetatarsal amputation of foot, left ([place]) 09/06/2015  . Diabetic foot ulcer ([place]) 07/13/2015  . Cellulitis 07/12/2015  . HLD (hyperlipidemia) 09/02/2012  . Peripheral nerve disease 09/02/2012  . Type 2 diabetes mellitus with peripheral angiopathy ([place]) 09/02/2012  .  Hypertriglyceridemia 07/28/2012  . Peripheral vascular disease ([place]) 07/28/2012  . Avitaminosis D 07/28/2012  . Essential (primary) hypertension 06/10/2012  . History of biliary T-tube placement 06/10/2012  . Leg pain 06/10/2012  . Current tobacco use 06/10/2012  . Atrophic vaginitis 06/07/2012  . Type 2 diabetes mellitus ([place]) 06/07/2012    Past Surgical History:  Procedure Laterality Date  . ABDOMINAL HYSTERECTOMY    . AMPUTATION Left 09/06/2015   Procedure: LEFT TRANSMETATARSAL AMPUTATTION;  Surgeon: Newt Minion, MD;  Location: [facility];  Service: Orthopedics;  Laterality: Left;  . AV FISTULA PLACEMENT Left 12/13/2018   Procedure: ARTERIOVENOUS (AV) FISTULA CREATION LEFT UPPER ARM;  Surgeon: Waynetta Sandy, MD;  Location: [facility];  Service: Vascular;  Laterality: Left;  . COLONOSCOPY     polyps x 1  . LEFT HEART CATH AND CORONARY ANGIOGRAPHY N/A 09/17/2016   Procedure: LEFT HEART CATH AND CORONARY ANGIOGRAPHY;  Surgeon: Jettie Booze, MD;  Location: [facility];  Service: Cardiovascular;  Laterality: N/A;  . PERIPHERAL VASCULAR CATHETERIZATION N/A 08/16/2015   Procedure: Abdominal Aortogram;  Surgeon: Elam Dutch, MD;  Location: [facility] CV [facility];  Service: Cardiovascular;  Laterality: N/A;  . PERIPHERAL VASCULAR CATHETERIZATION Bilateral 08/16/2015   Procedure: Lower Extremity Angiography;  Surgeon: Elam Dutch, MD;  Location: [facility];  Service: Cardiovascular;  Laterality: Bilateral;  . PERIPHERAL VASCULAR CATHETERIZATION Left 08/16/2015   Procedure: Peripheral Vascular Intervention;  Surgeon: Elam Dutch, MD;  Location: [facility];  Service: Cardiovascular;  Laterality: Left;  SFA STENT X 2     OB History   No obstetric history on file.     Family History  Problem Relation Age of Onset  . Diabetes Mother   . Hypertension Father     Social History   Tobacco Use  . Smoking status: Former Smoker    Packs/day: 1.00     Years: 42.00    Pack years: 42.00    Types: Cigarettes    Quit date: 07/17/2017    Years since quitting: 1.6  . Smokeless tobacco: Never Used  Substance Use Topics  . Alcohol use: Not Currently    Alcohol/week: 1.0 - 2.0 standard drinks    Types: 1 - 2 Glasses of wine per week    Comment: on social occassions  . Drug use: Yes    Frequency: 7.0 times per week    Types: Marijuana    Comment: day    Home Medications Prior to Admission medications   Medication Sig Start Date End Date Taking? Authorizing Provider  acetaminophen (TYLENOL) 500 MG tablet Take 1,000 mg by mouth every 6 (six) hours as needed for moderate pain or headache.   Yes [provider]  amLODipine (NORVASC) 5 MG tablet Take 5 mg by mouth daily.   Yes [provider]  Ascorbic Acid (VITAMIN C) 1000 MG tablet Take 1,000 mg by mouth daily.   Yes [provider]  aspirin 81 MG EC tablet Take 1 tablet (81 mg total) by mouth daily. Reported on 07/13/2015 07/02/16  Yes Charlott Rakes, MD  atorvastatin (LIPITOR) 80 MG tablet Take 1 tablet (80 mg total) by mouth daily. 10/07/17  Yes Scot Jun, FNP  Blood Glucose Monitoring Suppl (ACCU-CHEK AVIVA) device Use as instructed three times daily. Patient taking differently: Use as instructed twice times daily. 04/22/16  Yes Charlott Rakes, MD  carvedilol (COREG) 25 MG tablet Take 1 tablet (25 mg total) by mouth 2 (two) times daily. 10/07/17  Yes Scot Jun, FNP  cholecalciferol (VITAMIN D3) 25 MCG (1000 UT) tablet Take 1,000 Units by mouth daily.   Yes [provider]  clopidogrel (PLAVIX) 75 MG tablet Take 1 tablet (75 mg total) by mouth daily. 10/07/17  Yes Scot Jun, FNP  docusate sodium (COLACE) 100 MG capsule Take 200 mg by mouth 2 (two) times daily.    Yes [provider]  furosemide (LASIX) 80 MG tablet Take 80 mg by mouth 2 (two) times daily. 03/21/18  Yes [provider]  gabapentin (NEURONTIN) 300 MG  capsule Take 1 capsule (300 mg total) by mouth 3 (three) times daily. 10/07/17  Yes Scot Jun, FNP  glipiZIDE (GLUCOTROL) 5 MG tablet Take 0.5 tablets (2.5 mg total) by mouth daily before breakfast. Patient taking differently: Take 5 mg by mouth daily before breakfast.  11/01/18  Yes Renato Shin, MD  glucose blood (ACCU-CHEK AVIVA) test strip Use as instructed three times daily before meals. 10/14/17  Yes Charlott Rakes, MD  hydrALAZINE (APRESOLINE) 50 MG tablet Take 50 mg by mouth 3 (three) times daily.   Yes [provider]  isosorbide mononitrate (IMDUR) 30 MG 24 hr tablet Take 60 mg by mouth daily. 03/22/18  Yes [provider]  Lancet Devices [facility]) lancets Use as instructed three times daily before meals. 04/22/16  Yes Newlin, Enobong, MD  latanoprost (XALATAN) 0.005 % ophthalmic solution Place 1 drop into both eyes at bedtime.    Yes [provider]  Omega-3 Fatty Acids (FISH OIL) 1000 MG CAPS Take 1,000 mg by mouth daily.    Yes [provider]  ranolazine (RANEXA) 500 MG 12 hr tablet Take 1 tablet (500 mg total) by mouth 2 (two) times daily. 02/14/18  Yes Dorothy Spark, MD  sevelamer carbonate (RENVELA) 800 MG tablet Take 800 mg by mouth See admin instructions. Three times daily with meals and with snack.   Yes [provider]  silver sulfADIAZINE (SILVADENE) 1 % cream Apply 1 application topically daily. Apply to affected area daily plus dry dressing Patient taking differently: Apply 1 application topically daily as needed (wound care).  07/11/18  Yes Newt Minion, MD  sitaGLIPtin (JANUVIA) 25 MG tablet Take 1 tablet (25 mg total) by mouth daily. 10/07/17  Yes Scot Jun, FNP  ZOFRAN 4 MG tablet Take 4 mg by mouth every 8 (eight) hours as needed for nausea/vomiting. 03/08/19  Yes [provider]  nitroGLYCERIN (NITROSTAT) 0.4 MG SL tablet Place 1 tablet (0.4 mg total) under the tongue every 5 (five) minutes  as needed for chest pain. 01/18/17 03/21/19  Dorothy Spark, MD    Allergies    Patient has no known allergies.  Review of Systems   Review of Systems  Constitutional: Negative for chills and fever.  Respiratory: Negative for shortness of breath.   Cardiovascular: Negative for chest pain.  Gastrointestinal: Positive for abdominal pain, constipation and rectal pain. Negative for anal bleeding, blood in stool, nausea and vomiting.  All other systems reviewed and are negative.   Physical Exam Updated Vital Signs BP (!) 116/59   Pulse 68   Temp 98.8 F (37.1 C) (Oral)   Resp 14   Ht 5\' 4"  (1.626 m)   Wt 75.3 kg   SpO2 98%   BMI 28.49 kg/m   Physical Exam Vitals and nursing note reviewed.  Constitutional:      General: She is not in acute distress.    Appearance: She is well-developed.  HENT:     Head: Normocephalic and atraumatic.  Eyes:     General:        Right eye: No discharge.        Left eye: No discharge.     Conjunctiva/sclera: Conjunctivae normal.  Neck:     Vascular: No JVD.     Trachea: No tracheal deviation.  Cardiovascular:     Rate and Rhythm: Normal rate and regular rhythm.     Comments: AV fistula in the left upper extremity with palpable thrill.  Hickman catheter to the right chest Pulmonary:     Effort: Pulmonary effort is normal.     Breath sounds: Normal breath  sounds.  Abdominal:     General: Bowel sounds are normal. There is no distension.     Palpations: Abdomen is soft.     Tenderness: There is abdominal tenderness in the right upper quadrant, right lower quadrant, suprapubic area and left lower quadrant. There is no right CVA tenderness, left CVA tenderness, guarding or rebound. Negative signs include Murphy's sign.  Genitourinary:    Comments: Examination performed in the presence of a chaperone.  Patient with a small nonthrombosed nonbleeding external hemorrhoid in the 12 o'clock position which is tender to palpation.  No fissures or  tears noted.  No frank rectal bleeding. Skin:    Findings: No erythema.  Neurological:     Mental Status: She is alert.  Psychiatric:        Behavior: Behavior normal.     ED Results / Procedures / Treatments   Labs (all labs ordered are listed, but only abnormal results are displayed) Labs Reviewed  CBC WITH DIFFERENTIAL/PLATELET - Abnormal; Notable for the following components:      Result Value   WBC 12.9 (*)    Neutro Abs 8.9 (*)    All other components within normal limits  POCT I-STAT, CHEM 8 - Abnormal; Notable for the following components:   Chloride 93 (*)    BUN 27 (*)    Creatinine, Ser 3.80 (*)    Glucose, Bld 180 (*)    Calcium, Ion 0.99 (*)    TCO2 34 (*)    All other components within normal limits  RESPIRATORY PANEL BY RT PCR (FLU A&B, COVID)  URINALYSIS, ROUTINE W REFLEX MICROSCOPIC  COMPREHENSIVE METABOLIC PANEL  LIPASE, BLOOD    EKG None  Radiology CT ABDOMEN PELVIS WO CONTRAST  Result Date: 03/21/2019 CLINICAL DATA:  Right lower quadrant pain with constipation EXAM: CT ABDOMEN AND PELVIS WITHOUT CONTRAST TECHNIQUE: Multidetector CT imaging of the abdomen and pelvis was performed following the standard protocol without IV contrast. COMPARISON:  Radiographs 03/17/2019, CT 02/14/2009, 04/30/2009 FINDINGS: Lower chest: Lung bases demonstrate no acute consolidation or pleural effusion. Cardiac size within normal limits. Hepatobiliary: No focal hepatic abnormality. Mild gallbladder distention without calcified stone. Questionable subtle stranding around the gallbladder fundus on coronal views. No biliary dilatation Pancreas: Unremarkable. No pancreatic ductal dilatation or surrounding inflammatory changes. Spleen: Normal in size without focal abnormality. Adrenals/Urinary Tract: Adrenal glands show nodular thickening without dominant mass. Kidneys show no hydronephrosis. Multiple intrarenal calcifications likely vascular. Subcentimeter hypodense renal lesions too  small to further characterize. Urinary bladder is normal Stomach/Bowel: The stomach is nonenlarged. No dilated small bowel. Negative appendix. Liquid stools in the right and transverse colon. Moderate to large retained feces at the rectosigmoid colon. Soft tissue thickening and possible mild edema about the anal rectal region. Vascular/Lymphatic: Nonaneurysmal aorta. Moderate to marked aortic atherosclerosis. No aneurysm. Reproductive: Status post hysterectomy. No adnexal masses. Other: Negative for free air or free fluid. Mild generalized subcutaneous edema Musculoskeletal: No acute or significant osseous findings. IMPRESSION: 1. Negative for acute appendicitis. 2. Moderate formed feces at the rectum with liquid stools upstream to this. There is soft tissue fullness and questionable inflammatory change about the anorectal region, recommend correlation with direct inspection to exclude mass lesion in the area. 3. Slightly dilated gallbladder. Questionable soft tissue stranding/inflammation about the fundus, if symptoms are referable to the gallbladder, correlation with ultrasound could be obtained. Electronically Signed   By: Donavan Foil M.D.   On: 03/21/2019 15:42    Procedures Procedures (including critical care time)  Medications Ordered in ED Medications  phenylephrine ((USE for PREPARATION-H)) 0.25 % suppository 1 suppository (has no administration in time range)  acetaminophen (TYLENOL) tablet 1,000 mg (1,000 mg Oral Given 03/21/19 1122)  lidocaine (XYLOCAINE) 2 % jelly 1 application (1 application Topical Given 03/21/19 1334)  HYDROmorphone (DILAUDID) injection 1 mg (1 mg Intravenous Given 03/21/19 1411)    ED Course  I have reviewed the triage vital signs and the nursing notes.  Pertinent labs & imaging results that were available during my care of the patient were reviewed by me and considered in my medical decision making (see chart for details).    MDM Rules/Calculators/A&P                       Patient sent for evaluation of abdominal pain and constipation.  She was scheduled for an AV fistula procedure with Dr. Donzetta Matters with vascular surgery today but was noted to have significant pain with attempts to have a bowel movement in the bathroom and was noted to have right lower quadrant abdominal pain so was sent for further evaluation.  She is afebrile, vital signs are stable.  She is uncomfortable but nontoxic in appearance.  No rebound or guarding on examination of the abdomen.  This is her third visit in 4 days for evaluation of similar complaints however today the pain is more right-sided as well as around her rectum.  External examination yields small external hemorrhoid which is nonthrombosed or bleeding but tender to palpation.  No anal fissures, no frank rectal bleeding.  She refused digital rectal exam.  Low suspicion of Fournier's gangrene or perirectal abscess.  Lab work reviewed and interpreted by myself shows a new leukocytosis, no anemia, renal insufficiency at patient's baseline and consistent with her history of ESRD.  She has been compliant with dialysis and I see no evidence for emergent dialysis at this time.  On my initial examination she noted that she felt as though she needed to have a bowel movement but was afraid to due to her pain.  Topical lidocaine was applied around the anus with little relief.  She was then given a phenylephrine suppository.  Patient's RN noted that the was writhing around, difficult to obtain blood from due to general noncompliance and requesting pain medicine.  I explained to the patient that narcotic pain medicines would make her constipation worse and will not be in her best interests but she will get 1 dose here in the ED for comfort until her work-up is completed.  3:30PM  signed out care to oncoming provider PA Lawyer.  Pending imaging and follow-up.  If no acute surgical abdominal pathology and patient will likely be stable for discharge home  with good bowel prep and close follow-up with PCP or possibly GI.   Final Clinical Impression(s) / ED Diagnoses Final diagnoses:  Abdominal pain    Rx / DC Orders ED Discharge Orders    None       Renita Papa, PA-C 03/21/19 [place], [place], DO 03/21/19 1607

## 2019-03-21 NOTE — Progress Notes (Signed)
Patient reports being constipated and trying to go to restroom. Patient reports severe pain and is crying due to pain. Patient reports generalized and right lower quadrant lower abd. pain since Thursday or Friday. Active bowel sounds. Tenderness to palpation on RLQ. Reports only able to pass small amount of liquid stool. Spoke to Dr. Donzetta Matters will send patient to ER for evaluation of abd. pain.

## 2019-03-21 NOTE — ED Triage Notes (Signed)
Pt reports constipation x5 days, seen at [facility] x2 for the same, pt reports she has to hold her buttocks when she poops. Pt sent her by Dr. Donzetta Matters, she was scheduled to have her "fistula lifted" but he sent her here first for her constipation

## 2019-03-21 NOTE — ED Notes (Signed)
RN attempted to give pt her suppository, pt tried to push RN away and grabbed RN's arm while yelling that it hurt. RN explained that the suppository was not fully in, pt yelled that she wanted another nurse and that this RN wasn't allowed to go near her anymore.

## 2019-03-21 NOTE — ED Notes (Signed)
Pt verbalized understanding of discharge instructions. Follow up care and prescriptions reviewed. Pt brought to lobby via wheelchair.

## 2019-03-23 MED ORDER — ISOSORBIDE MONONITRATE ER 30 MG PO TB24
30.00 | ORAL_TABLET | ORAL | Status: DC
Start: 2019-03-23 — End: 2019-03-23

## 2019-03-28 ENCOUNTER — Encounter (HOSPITAL_COMMUNITY): Payer: Self-pay | Admitting: Surgery

## 2019-03-28 ENCOUNTER — Ambulatory Visit: Payer: Medicare Other | Admitting: Endocrinology

## 2019-03-28 ENCOUNTER — Other Ambulatory Visit: Payer: Self-pay

## 2019-03-28 DIAGNOSIS — Z0289 Encounter for other administrative examinations: Secondary | ICD-10-CM

## 2019-03-28 NOTE — Progress Notes (Addendum)
Patient denies shortness of breath, fever, cough and chest pain.  PCP - [facility] Cardiologist - Dr Ena Dawley Internal Med - Dr Renato Shin  Chest x-ray - denies EKG - 03/22/19 CE- tracing requested.DOS Stress Test - denies ECHO - 03/06/19 Cardiac Cath - 09/17/16  Sleep Study -  Yes, 10/28/17 - Negative  Fasting Blood Sugar - unknown Checks Blood Sugar - pt. does not check  blood sugar. . Do not take oral diabetes medicines (Glipizide or Januvia januvia) the morning of surgery.  . If your blood sugar is less than 70 mg/dL, you will need to treat for low blood sugar: o Treat a low blood sugar (less than 70 mg/dL) with  cup of clear juice (cranberry or apple), 4 glucose tablets, OR glucose gel. o Recheck blood sugar in 15 minutes after treatment (to make sure it is greater than 70 mg/dL). If your blood sugar is not greater than 70 mg/dL on recheck, call [phone] for further instructions. o  Anesthesia review: Yes  STOP now taking any Aspirin (unless otherwise instructed by your surgeon), Aleve, Naproxen, Ibuprofen, Motrin, Advil, Goody's, BC's, all herbal medications, fish oil, and all vitamins.   Coronavirus Screening Covid test scheduled on 03/29/19 at 11 am. Have you experienced the following symptoms:  Cough yes/no: No Fever (>100.42F)  yes/no: No Runny nose yes/no: No Sore throat yes/no: No Difficulty breathing/shortness of breath  yes/no: No  Have you traveled in the last 14 days and where? yes/no: No  Patient verbalized understanding of instructions that were given via phone.

## 2019-03-29 ENCOUNTER — Other Ambulatory Visit: Payer: Self-pay

## 2019-03-29 ENCOUNTER — Other Ambulatory Visit (HOSPITAL_COMMUNITY)
Admission: RE | Admit: 2019-03-29 | Discharge: 2019-03-29 | Disposition: A | Discharge status: Home / Self Care | Payer: Medicare Other | Source: Ambulatory Visit | Attending: Surgery | Admitting: Surgery

## 2019-03-29 DIAGNOSIS — Z7982 Long term (current) use of aspirin: Secondary | ICD-10-CM | POA: Diagnosis not present

## 2019-03-29 DIAGNOSIS — I132 Hypertensive heart and chronic kidney disease with heart failure and with stage 5 chronic kidney disease, or end stage renal disease: Secondary | ICD-10-CM | POA: Diagnosis not present

## 2019-03-29 DIAGNOSIS — N186 End stage renal disease: Secondary | ICD-10-CM | POA: Diagnosis not present

## 2019-03-29 DIAGNOSIS — Z992 Dependence on renal dialysis: Secondary | ICD-10-CM | POA: Diagnosis not present

## 2019-03-29 DIAGNOSIS — I2510 Atherosclerotic heart disease of native coronary artery without angina pectoris: Secondary | ICD-10-CM | POA: Diagnosis not present

## 2019-03-29 DIAGNOSIS — J449 Chronic obstructive pulmonary disease, unspecified: Secondary | ICD-10-CM | POA: Diagnosis not present

## 2019-03-29 DIAGNOSIS — I509 Heart failure, unspecified: Secondary | ICD-10-CM | POA: Diagnosis not present

## 2019-03-29 DIAGNOSIS — E1122 Type 2 diabetes mellitus with diabetic chronic kidney disease: Secondary | ICD-10-CM | POA: Diagnosis present

## 2019-03-29 DIAGNOSIS — Z7984 Long term (current) use of oral hypoglycemic drugs: Secondary | ICD-10-CM | POA: Diagnosis not present

## 2019-03-29 DIAGNOSIS — Z20822 Contact with and (suspected) exposure to covid-19: Secondary | ICD-10-CM | POA: Diagnosis not present

## 2019-03-29 DIAGNOSIS — I252 Old myocardial infarction: Secondary | ICD-10-CM | POA: Diagnosis not present

## 2019-03-29 DIAGNOSIS — Z7902 Long term (current) use of antithrombotics/antiplatelets: Secondary | ICD-10-CM | POA: Diagnosis not present

## 2019-03-29 DIAGNOSIS — E1151 Type 2 diabetes mellitus with diabetic peripheral angiopathy without gangrene: Secondary | ICD-10-CM | POA: Diagnosis not present

## 2019-03-29 DIAGNOSIS — Z79899 Other long term (current) drug therapy: Secondary | ICD-10-CM | POA: Diagnosis not present

## 2019-03-29 DIAGNOSIS — Z87891 Personal history of nicotine dependence: Secondary | ICD-10-CM | POA: Diagnosis not present

## 2019-03-29 LAB — SARS CORONAVIRUS 2 (TAT 6-24 HRS): SARS Coronavirus 2: NEGATIVE

## 2019-03-29 NOTE — Anesthesia Preprocedure Evaluation (Addendum)
Anesthesia Evaluation  Patient identified by MRN, date of birth, ID band Patient awake    Reviewed: Allergy & Precautions, NPO status , Patient's Chart, lab work & pertinent test results  Airway Mallampati: II  TM Distance: >3 FB Neck ROM: Full    Dental no notable dental hx.    Pulmonary COPD, former smoker,    Pulmonary exam normal breath sounds clear to auscultation       Cardiovascular hypertension, + CAD, + Past MI, + Peripheral Vascular Disease, +CHF and + DOE  Normal cardiovascular exam Rhythm:Regular Rate:Normal     Neuro/Psych negative neurological ROS  negative psych ROS   GI/Hepatic negative GI ROS, Neg liver ROS,   Endo/Other  diabetes  Renal/GU negative Renal ROS  negative genitourinary   Musculoskeletal negative musculoskeletal ROS (+)   Abdominal   Peds negative pediatric ROS (+)  Hematology negative hematology ROS (+)   Anesthesia Other Findings   Reproductive/Obstetrics negative OB ROS                            Anesthesia Physical Anesthesia Plan  ASA: IV  Anesthesia Plan: MAC   Post-op Pain Management:    Induction: Intravenous  PONV Risk Score and Plan: 1 and Ondansetron  Airway Management Planned: Simple Face Mask  Additional Equipment:   Intra-op Plan:   Post-operative Plan:   Informed Consent: I have reviewed the patients History and Physical, chart, labs and discussed the procedure including the risks, benefits and alternatives for the proposed anesthesia with the patient or authorized representative who has indicated his/her understanding and acceptance.     Dental advisory given  Plan Discussed with: CRNA and Surgeon  Anesthesia Plan Comments: ( Follows with cardiology for hx of CAD status post NSTEMI 09/2016 with severe diffuse disease of the RCA, ramus and circumflex with mild to moderate disease of the LAD. Medical therapy was recommended  because multiple stents and significant and contrast would have to be used.Last seen 02/14/19 and stable at that time, recommended continue medical therapy for now.   ESRD on HD MWF currently via right IJ. Follows with [employer] [facility].  PVD status post left SFA and popliteal stent and left SFA PTA with drug-coated balloon, left transmetatarsal amputation.   DMII followed by endocrinology, last A1c 6.9 on 02/02/19.  Will need DOS labs and eval.  EKG 03/21/10: Normal sinus rhythm. Rate 88. Minimal voltage criteria for LVH, may be normal variant ( Cornell product ). Possible Inferior infarct , age undetermined  TTE 01/06/18 (care everywhere): Summary Well preserved LV systolic function with a small area of basal inferoposterior akinesis. Ejection fraction is visually estimated at 26-% Diastolic function appears indeterminae Mild to moderate mitral regurgitation. The aortic valve appears to be trileaflet. There is mild aortic sclerosis noted, with no evidence of stenosis.)       Anesthesia Quick Evaluation

## 2019-03-29 NOTE — Progress Notes (Signed)
Anesthesia Chart Review: Same day workup   Follows with cardiology for hx of CAD status post NSTEMI 09/2016 with severe diffuse disease of the RCA, ramus and circumflex with mild to moderate disease of the LAD. Medical therapy was recommended because multiple stents and significant and contrast would have to be used.Last seen 02/14/19 and stable at that time, recommended continue medical therapy for now.   ESRD on HD MWF currently via right IJ. Follows with [employer] [facility].  PVD status post left SFA and popliteal stent and left SFA PTA with drug-coated balloon, left transmetatarsal amputation.   DMII followed by endocrinology, last A1c 6.9 on 02/02/19.  Will need DOS labs and eval.  EKG 03/21/10: Normal sinus rhythm. Rate 88. Minimal voltage criteria for LVH, may be normal variant ( Cornell product ). Possible Inferior infarct , age undetermined  TTE 01/06/18 (care everywhere): Summary Well preserved LV systolic function with a small area of basal inferoposterior akinesis. Ejection fraction is visually estimated at 29-% Diastolic function appears indeterminae Mild to moderate mitral regurgitation. The aortic valve appears to be trileaflet. There is mild aortic sclerosis noted, with no evidence of stenosis.   Rebekah Johnson [facility]/Anesthesiology Phone [phone] 03/29/2019 11:46 AM

## 2019-03-30 ENCOUNTER — Ambulatory Visit (HOSPITAL_COMMUNITY): Payer: Medicare Other | Admitting: Vascular Surgery

## 2019-03-30 ENCOUNTER — Ambulatory Visit (HOSPITAL_COMMUNITY)
Admission: RE | Admit: 2019-03-30 | Discharge: 2019-03-30 | Disposition: A | Discharge status: Home / Self Care | Payer: Medicare Other | Source: Ambulatory Visit | Attending: Surgery | Admitting: Surgery

## 2019-03-30 ENCOUNTER — Encounter (HOSPITAL_COMMUNITY)
Admission: RE | Disposition: A | Discharge status: Home / Self Care | Payer: Self-pay | Source: Ambulatory Visit | Attending: Surgery

## 2019-03-30 ENCOUNTER — Other Ambulatory Visit: Payer: Self-pay

## 2019-03-30 ENCOUNTER — Encounter (HOSPITAL_COMMUNITY): Payer: Self-pay | Admitting: Surgery

## 2019-03-30 DIAGNOSIS — N186 End stage renal disease: Secondary | ICD-10-CM | POA: Diagnosis not present

## 2019-03-30 DIAGNOSIS — N185 Chronic kidney disease, stage 5: Secondary | ICD-10-CM

## 2019-03-30 DIAGNOSIS — Z7984 Long term (current) use of oral hypoglycemic drugs: Secondary | ICD-10-CM | POA: Insufficient documentation

## 2019-03-30 DIAGNOSIS — I132 Hypertensive heart and chronic kidney disease with heart failure and with stage 5 chronic kidney disease, or end stage renal disease: Secondary | ICD-10-CM | POA: Insufficient documentation

## 2019-03-30 DIAGNOSIS — I252 Old myocardial infarction: Secondary | ICD-10-CM | POA: Insufficient documentation

## 2019-03-30 DIAGNOSIS — E1151 Type 2 diabetes mellitus with diabetic peripheral angiopathy without gangrene: Secondary | ICD-10-CM | POA: Insufficient documentation

## 2019-03-30 DIAGNOSIS — E1122 Type 2 diabetes mellitus with diabetic chronic kidney disease: Secondary | ICD-10-CM | POA: Diagnosis not present

## 2019-03-30 DIAGNOSIS — I2510 Atherosclerotic heart disease of native coronary artery without angina pectoris: Secondary | ICD-10-CM | POA: Insufficient documentation

## 2019-03-30 DIAGNOSIS — Z992 Dependence on renal dialysis: Secondary | ICD-10-CM | POA: Insufficient documentation

## 2019-03-30 DIAGNOSIS — Z20822 Contact with and (suspected) exposure to covid-19: Secondary | ICD-10-CM | POA: Diagnosis not present

## 2019-03-30 DIAGNOSIS — Z87891 Personal history of nicotine dependence: Secondary | ICD-10-CM | POA: Insufficient documentation

## 2019-03-30 DIAGNOSIS — J449 Chronic obstructive pulmonary disease, unspecified: Secondary | ICD-10-CM | POA: Insufficient documentation

## 2019-03-30 DIAGNOSIS — Z7982 Long term (current) use of aspirin: Secondary | ICD-10-CM | POA: Insufficient documentation

## 2019-03-30 DIAGNOSIS — Z7902 Long term (current) use of antithrombotics/antiplatelets: Secondary | ICD-10-CM | POA: Insufficient documentation

## 2019-03-30 DIAGNOSIS — Z79899 Other long term (current) drug therapy: Secondary | ICD-10-CM | POA: Insufficient documentation

## 2019-03-30 DIAGNOSIS — I509 Heart failure, unspecified: Secondary | ICD-10-CM | POA: Insufficient documentation

## 2019-03-30 HISTORY — PX: BASCILIC VEIN TRANSPOSITION: SHX5742

## 2019-03-30 LAB — POCT I-STAT, CHEM 8
BUN: 33 mg/dL — ABNORMAL HIGH (ref 8–23)
Calcium, Ion: 1.21 mmol/L (ref 1.15–1.40)
Chloride: 97 mmol/L — ABNORMAL LOW (ref 98–111)
Creatinine, Ser: 3.5 mg/dL — ABNORMAL HIGH (ref 0.44–1.00)
Glucose, Bld: 164 mg/dL — ABNORMAL HIGH (ref 70–99)
HCT: 40 % (ref 36.0–46.0)
Hemoglobin: 13.6 g/dL (ref 12.0–15.0)
Potassium: 3.4 mmol/L — ABNORMAL LOW (ref 3.5–5.1)
Sodium: 138 mmol/L (ref 135–145)
TCO2: 31 mmol/L (ref 22–32)

## 2019-03-30 LAB — GLUCOSE, CAPILLARY
Glucose-Capillary: 157 mg/dL — ABNORMAL HIGH (ref 70–99)
Glucose-Capillary: 151 mg/dL — ABNORMAL HIGH (ref 70–99)

## 2019-03-30 SURGERY — TRANSPOSITION, VEIN, BASILIC
Anesthesia: Monitor Anesthesia Care | Site: Arm Upper | Laterality: Left

## 2019-03-30 MED ORDER — SODIUM CHLORIDE 0.9 % IV SOLN
INTRAVENOUS | Status: DC | PRN
  Administered 2019-03-30: 500 mL

## 2019-03-30 MED ORDER — LIDOCAINE HCL (PF) 1 % IJ SOLN
INTRAMUSCULAR | Status: AC
  Filled 2019-03-30: qty 30

## 2019-03-30 MED ORDER — MIDAZOLAM HCL 2 MG/2ML IJ SOLN
INTRAMUSCULAR | Status: AC
  Filled 2019-03-30: qty 2

## 2019-03-30 MED ORDER — FENTANYL CITRATE (PF) 100 MCG/2ML IJ SOLN: 25.0000 ug | INTRAMUSCULAR | Status: DC | PRN

## 2019-03-30 MED ORDER — ONDANSETRON HCL 4 MG/2ML IJ SOLN
INTRAMUSCULAR | Status: DC | PRN
  Administered 2019-03-30: 4 mg via INTRAVENOUS

## 2019-03-30 MED ORDER — 0.9 % SODIUM CHLORIDE (POUR BTL) OPTIME
TOPICAL | Status: DC | PRN
  Administered 2019-03-30: 1000 mL

## 2019-03-30 MED ORDER — LIDOCAINE 2% (20 MG/ML) 5 ML SYRINGE
INTRAMUSCULAR | Status: AC
  Filled 2019-03-30: qty 5

## 2019-03-30 MED ORDER — CHLORHEXIDINE GLUCONATE 4 % EX LIQD: 60.0000 mL | Freq: Once | CUTANEOUS | Status: DC

## 2019-03-30 MED ORDER — FENTANYL CITRATE (PF) 250 MCG/5ML IJ SOLN
INTRAMUSCULAR | Status: AC
  Filled 2019-03-30: qty 5

## 2019-03-30 MED ORDER — LIDOCAINE 2% (20 MG/ML) 5 ML SYRINGE
INTRAMUSCULAR | Status: DC | PRN
  Administered 2019-03-30: 60 mg via INTRAVENOUS

## 2019-03-30 MED ORDER — CEFAZOLIN SODIUM-DEXTROSE 2-4 GM/100ML-% IV SOLN
2.0000 g | INTRAVENOUS | Status: AC
  Administered 2019-03-30: 2 g via INTRAVENOUS
  Filled 2019-03-30: qty 100

## 2019-03-30 MED ORDER — ONDANSETRON HCL 4 MG/2ML IJ SOLN
INTRAMUSCULAR | Status: AC
  Filled 2019-03-30: qty 2

## 2019-03-30 MED ORDER — PROPOFOL 10 MG/ML IV BOLUS
INTRAVENOUS | Status: AC
  Filled 2019-03-30: qty 20

## 2019-03-30 MED ORDER — SODIUM CHLORIDE 0.9 % IV SOLN
INTRAVENOUS | Status: AC
  Filled 2019-03-30: qty 1.2

## 2019-03-30 MED ORDER — FENTANYL CITRATE (PF) 100 MCG/2ML IJ SOLN
INTRAMUSCULAR | Status: DC | PRN
  Administered 2019-03-30 (×6): 25 ug via INTRAVENOUS
  Administered 2019-03-30: 50 ug via INTRAVENOUS

## 2019-03-30 MED ORDER — SODIUM CHLORIDE 0.9 % IV SOLN: INTRAVENOUS | Status: DC

## 2019-03-30 MED ORDER — LIDOCAINE-EPINEPHRINE 1 %-1:100000 IJ SOLN
INTRAMUSCULAR | Status: AC
  Filled 2019-03-30: qty 1

## 2019-03-30 MED ORDER — ONDANSETRON HCL 4 MG/2ML IJ SOLN: 4.0000 mg | Freq: Once | INTRAMUSCULAR | Status: DC | PRN

## 2019-03-30 MED ORDER — PHENYLEPHRINE HCL-NACL 10-0.9 MG/250ML-% IV SOLN
INTRAVENOUS | Status: DC | PRN
  Administered 2019-03-30: 50 ug/min via INTRAVENOUS

## 2019-03-30 MED ORDER — PROPOFOL 500 MG/50ML IV EMUL
INTRAVENOUS | Status: DC | PRN
  Administered 2019-03-30: 25 ug/kg/min via INTRAVENOUS

## 2019-03-30 MED ORDER — LIDOCAINE HCL 1 % IJ SOLN
INTRAMUSCULAR | Status: DC | PRN
  Administered 2019-03-30: 15 mL

## 2019-03-30 MED ORDER — OXYCODONE-ACETAMINOPHEN 5-325 MG PO TABS: 1.0000 | ORAL_TABLET | Freq: Four times a day (QID) | ORAL | 0 refills | Status: DC | PRN

## 2019-03-30 MED ORDER — PROPOFOL 10 MG/ML IV BOLUS
INTRAVENOUS | Status: DC | PRN
  Administered 2019-03-30 (×4): 20 mg via INTRAVENOUS

## 2019-03-30 MED ORDER — MIDAZOLAM HCL 5 MG/5ML IJ SOLN
INTRAMUSCULAR | Status: DC | PRN
  Administered 2019-03-30 (×2): 1 mg via INTRAVENOUS

## 2019-03-30 SURGICAL SUPPLY — 41 items
ARMBAND PINK RESTRICT EXTREMIT (MISCELLANEOUS) ×3 IMPLANT
CANISTER SUCT 3000ML PPV (MISCELLANEOUS) ×3 IMPLANT
CLIP VESOCCLUDE MED 24/CT (CLIP) IMPLANT
CLIP VESOCCLUDE MED 6/CT (CLIP) IMPLANT
CLIP VESOCCLUDE SM WIDE 24/CT (CLIP) IMPLANT
CLIP VESOCCLUDE SM WIDE 6/CT (CLIP) IMPLANT
COVER PROBE W GEL 5X96 (DRAPES) ×3 IMPLANT
COVER WAND RF STERILE (DRAPES) ×3 IMPLANT
DERMABOND ADVANCED (GAUZE/BANDAGES/DRESSINGS) ×2
DERMABOND ADVANCED .7 DNX12 (GAUZE/BANDAGES/DRESSINGS) ×1 IMPLANT
ELECT REM PT RETURN 9FT ADLT (ELECTROSURGICAL) ×3
ELECTRODE REM PT RTRN 9FT ADLT (ELECTROSURGICAL) ×1 IMPLANT
GLOVE BIOGEL PI IND STRL 6 (GLOVE) IMPLANT
GLOVE BIOGEL PI IND STRL 6.5 (GLOVE) IMPLANT
GLOVE BIOGEL PI IND STRL 7.5 (GLOVE) ×1 IMPLANT
GLOVE BIOGEL PI IND STRL 8 (GLOVE) IMPLANT
GLOVE BIOGEL PI INDICATOR 6 (GLOVE) ×2
GLOVE BIOGEL PI INDICATOR 6.5 (GLOVE) ×2
GLOVE BIOGEL PI INDICATOR 7.5 (GLOVE) ×4
GLOVE BIOGEL PI INDICATOR 8 (GLOVE) ×2
GLOVE ECLIPSE 6.5 STRL STRAW (GLOVE) ×2 IMPLANT
GLOVE ECLIPSE 7.0 STRL STRAW (GLOVE) ×2 IMPLANT
GLOVE SURG SS PI 7.5 STRL IVOR (GLOVE) ×3 IMPLANT
GOWN STRL REUS W/ TWL LRG LVL3 (GOWN DISPOSABLE) ×2 IMPLANT
GOWN STRL REUS W/ TWL XL LVL3 (GOWN DISPOSABLE) ×1 IMPLANT
GOWN STRL REUS W/TWL LRG LVL3 (GOWN DISPOSABLE) ×4
GOWN STRL REUS W/TWL XL LVL3 (GOWN DISPOSABLE) ×2
HEMOSTAT SNOW SURGICEL 2X4 (HEMOSTASIS) IMPLANT
KIT BASIN OR (CUSTOM PROCEDURE TRAY) ×3 IMPLANT
KIT TURNOVER KIT B (KITS) ×3 IMPLANT
NS IRRIG 1000ML POUR BTL (IV SOLUTION) ×3 IMPLANT
PACK CV ACCESS (CUSTOM PROCEDURE TRAY) ×3 IMPLANT
PAD ARMBOARD 7.5X6 YLW CONV (MISCELLANEOUS) ×6 IMPLANT
SUT PROLENE 6 0 CC (SUTURE) ×3 IMPLANT
SUT SILK 2 0 SH (SUTURE) IMPLANT
SUT VIC AB 3-0 SH 27 (SUTURE) ×2
SUT VIC AB 3-0 SH 27X BRD (SUTURE) ×1 IMPLANT
SUT VICRYL 4-0 PS2 18IN ABS (SUTURE) ×3 IMPLANT
TOWEL GREEN STERILE (TOWEL DISPOSABLE) ×3 IMPLANT
UNDERPAD 30X30 (UNDERPADS AND DIAPERS) ×3 IMPLANT
WATER STERILE IRR 1000ML POUR (IV SOLUTION) ×3 IMPLANT

## 2019-03-30 NOTE — Transfer of Care (Signed)
Immediate Anesthesia Transfer of Care Note  Patient: Rebekah Johnson  Procedure(s) Performed: LEFT ARM ARTERIOVENOUS VEIN TRANSPOSITION (Left Arm Upper)  Patient Location: PACU  Anesthesia Type:MAC  Level of Consciousness: awake and alert   Airway & Oxygen Therapy: Patient Spontanous Breathing and Patient connected to face mask oxygen  Post-op Assessment: Report given to RN and Post -op Vital signs reviewed and stable  Post vital signs: Reviewed and stable  Last Vitals:  Vitals Value Taken Time  BP 153/137 03/30/19 0850  Temp    Pulse 62 03/30/19 0850  Resp 16 03/30/19 0850  SpO2 100 % 03/30/19 0850  Vitals shown include unvalidated device data.  Last Pain:  Vitals:   03/30/19 0615  TempSrc: Oral  PainSc:       Patients Stated Pain Goal: 2 (67/59/16 3846)  Complications: No apparent anesthesia complications

## 2019-03-30 NOTE — Interval H&P Note (Signed)
History and Physical Interval Note:  03/30/2019 7:32 AM  Rebekah Johnson  has presented today for surgery, with the diagnosis of END STAGE RENAL DISEASE.  The various methods of treatment have been discussed with the patient and family. After consideration of risks, benefits and other options for treatment, the patient has consented to  Procedure(s): LEFT ARM ARTERIOVENOUS VEIN TRANSPOSITION (Left) as a surgical intervention.  The patient's history has been reviewed, patient examined, no change in status, stable for surgery.  I have reviewed the patient's chart and labs.  Questions were answered to the patient's satisfaction.     Annamarie Major

## 2019-03-30 NOTE — Anesthesia Postprocedure Evaluation (Signed)
Anesthesia Post Note  Patient: Rebekah Johnson  Procedure(s) Performed: LEFT ARM ARTERIOVENOUS VEIN TRANSPOSITION (Left Arm Upper)     Patient location during evaluation: PACU Anesthesia Type: MAC Level of consciousness: awake and alert Pain management: pain level controlled Vital Signs Assessment: post-procedure vital signs reviewed and stable Respiratory status: spontaneous breathing, nonlabored ventilation, respiratory function stable and patient connected to nasal cannula oxygen Cardiovascular status: stable and blood pressure returned to baseline Postop Assessment: no apparent nausea or vomiting Anesthetic complications: no    Last Vitals:  Vitals:   03/30/19 0900 03/30/19 0905  BP: 139/60 140/62  Pulse: 64 61  Resp: 15 20  Temp:    SpO2: 96% 98%    Last Pain:  Vitals:   03/30/19 0905  TempSrc:   PainSc: 0-No pain                 Tova Vater S

## 2019-03-30 NOTE — Op Note (Signed)
    Patient name: Rebekah Johnson MRN: [id number] DOB: 14-Sep-1960 Sex: female  03/30/2019 Pre-operative Diagnosis: ESRD Post-operative diagnosis:  Same Surgeon:  Annamarie Major Assistants:  Laurence Slate Procedure:   Elevation of left arm fistula and branch ligation Anesthesia:  MAC Blood Loss:  minimal Specimens:  none  Findings:  77m vein  Indications:  [age]year old with ESRD with a non-maturing fistula who is here today for elevation  Procedure:  The patient was identified in the holding area and taken to [id number]  The patient was then placed supine on the table. MAC anesthesia was administered.  The patient was prepped and draped in the usual sterile fashion.  A time out was called and antibiotics were administered.  1% lidocaine was used for local anesthesia.  2 longitudinal incisions were made anterior to the fistula.  The vein was fully mobilized.  1 side branch was ligated.  The vein was at least 576mthroughout.  3-0 vicryl was used to close the tissue posterior to the vein.  The skin was closed directly over the fistula.  Dermabond was applied    Disposition:  To PACU stable   V. WeAnnamarie MajorM.D., FADoctors Hospital LLCascular and Vein Specialists of GrCrown Pointffice: [phone]:  [phone]

## 2019-03-30 NOTE — Discharge Instructions (Signed)
° °  [facility] [place] ° °Discharge Instructions ° °AV Fistula or Graft Surgery for Dialysis Access ° °Please refer to the following instructions for your post-procedure care. Your surgeon or physician assistant will discuss any changes with you. ° °Activity ° °You may drive the day following your surgery, if you are comfortable and no longer taking prescription pain medication. Resume full activity as the soreness in your incision resolves. ° °Bathing/Showering ° °You may shower after you go home. Keep your incision dry for 48 hours. Do not soak in a bathtub, hot tub, or swim until the incision heals completely. You may not shower if you have a hemodialysis catheter. ° °Incision Care ° °Clean your incision with mild soap and water after 48 hours. Pat the area dry with a clean towel. You do not need a bandage unless otherwise instructed. Do not apply any ointments or creams to your incision. You may have skin glue on your incision. Do not peel it off. It will come off on its own in about one week. Your arm may swell a bit after surgery. To reduce swelling use pillows to elevate your arm so it is above your heart. Your doctor will tell you if you need to lightly wrap your arm with an ACE bandage. ° °Diet ° °Resume your normal diet. There are not special food restrictions following this procedure. In order to heal from your surgery, it is CRITICAL to get adequate nutrition. Your body requires vitamins, minerals, and protein. Vegetables are the best source of vitamins and minerals. Vegetables also provide the perfect balance of protein. Processed food has little nutritional value, so try to avoid this. ° °Medications ° °Resume taking all of your medications. If your incision is causing pain, you may take over-the counter pain relievers such as acetaminophen (Tylenol). If you were prescribed a stronger pain medication, please be aware these medications can cause nausea and constipation. Prevent  nausea by taking the medication with a snack or meal. Avoid constipation by drinking plenty of fluids and eating foods with high amount of fiber, such as fruits, vegetables, and grains. Do not take Tylenol if you are taking prescription pain medications. ° ° ° ° °Follow up °Your surgeon may want to see you in the office following your access surgery. If so, this will be arranged at the time of your surgery. ° °Please call us immediately for any of the following conditions: ° °Increased pain, redness, drainage (pus) from your incision site °Fever of 101 degrees or higher °Severe or worsening pain at your incision site °Hand pain or numbness. ° °Reduce your risk of vascular disease: ° °Stop smoking. If you would like help, call [facility] at 1-800-QUIT-NOW (1-800-784-8669) or [facility] at 336-586-4000 ° °Manage your cholesterol °Maintain a desired weight °Control your diabetes °Keep your blood pressure down ° °Dialysis ° °It will take several weeks to several months for your new dialysis access to be ready for use. Your surgeon will determine when it is OK to use it. Your nephrologist will continue to direct your dialysis. You can continue to use your Permcath until your new access is ready for use. ° °If you have any questions, please call the office at [phone]. ° °

## 2019-04-07 ENCOUNTER — Other Ambulatory Visit (HOSPITAL_COMMUNITY): Payer: Medicare Other

## 2019-04-09 ENCOUNTER — Other Ambulatory Visit: Payer: Self-pay | Admitting: Physician Assistant

## 2019-04-18 ENCOUNTER — Ambulatory Visit: Payer: Medicare Other | Admitting: Cardiology

## 2019-04-19 ENCOUNTER — Telehealth (HOSPITAL_COMMUNITY): Payer: Self-pay | Admitting: *Deleted

## 2019-04-19 ENCOUNTER — Ambulatory Visit: Payer: Self-pay

## 2019-04-19 ENCOUNTER — Ambulatory Visit (INDEPENDENT_AMBULATORY_CARE_PROVIDER_SITE_OTHER): Payer: Medicare Other | Admitting: Family

## 2019-04-19 ENCOUNTER — Other Ambulatory Visit: Payer: Self-pay

## 2019-04-19 ENCOUNTER — Encounter: Payer: Self-pay | Admitting: Family

## 2019-04-19 VITALS — Ht 64.0 in | Wt 160.0 lb

## 2019-04-19 DIAGNOSIS — M79671 Pain in right foot: Secondary | ICD-10-CM | POA: Diagnosis not present

## 2019-04-19 NOTE — Progress Notes (Signed)
Office Visit Note   Patient: Rebekah Johnson           Date of Birth: 1960/10/22           MRN: [id number] Visit Date: 04/19/2019              Requested by: No referring provider defined for this encounter. PCP: Dallas Schimke, MD  Chief Complaint  Patient presents with  . Right Foot - Pain      HPI: The patient is a 59 year old woman with history of a transmetatarsal amputation of the right foot who was caring for some small children yesterday when she tripped and fell.  States that her right foot folded underneath of her and her left foot was out front of her essentially doing a split with her right foot folded.  She immediately was unable to bear weight on the right foot.  She is complaining of tenderness and swelling over the dorsum of her foot.  She has some tenderness to the medial column of her foot as well.  Assessment & Plan: Visit Diagnoses:  1. Pain in right foot     Plan: Concern for Lisfranc dislocation.  Patient unable to tolerate weightbearing views.  We will place her in a cam walker I am going to proceed with ABIs of the right lower extremity as always unable to palpate a dorsalis pedis pulse she does have a monophasic pulse with the Doppler.  Nonweightbearing of the right lower extremity.  She will follow-up on Monday  Follow-Up Instructions: No follow-ups on file.   Ortho Exam  Patient is alert, oriented, no adenopathy, well-dressed, normal affect, normal respiratory effort. On examination of the right lower extremity she has some moderate edema with some erythema over the dorsum of her foot she has pain along the medial column along the course of her posterior tibial tendon as well as tenderness exquisitely over the midfoot.  She is unable to tolerate weightbearing view she is unable to tolerate strength testing of her foot I am unable to palpate a dorsalis pedis pulse however she does have a monophasic pulse with the Doppler.  She has a stable left  transmetatarsal amputation.  Imaging: No results found. No images are attached to the encounter.  Labs: Lab Results  Component Value Date   HGBA1C 6.9 (A) 02/02/2019   HGBA1C 5.6 11/01/2018   HGBA1C 5.5 08/31/2018   ESRSEDRATE 57 (H) 07/13/2015   CRP 0.7 07/13/2015   LABURIC 5.7 07/13/2015   REPTSTATUS 01/04/2017 FINAL 01/04/2017   CULT (A) 01/01/2017    VIRIDANS STREPTOCOCCUS THE SIGNIFICANCE OF ISOLATING THIS ORGANISM FROM A SINGLE SET OF BLOOD CULTURES WHEN MULTIPLE SETS ARE DRAWN IS UNCERTAIN. PLEASE NOTIFY THE MICROBIOLOGY DEPARTMENT WITHIN ONE WEEK IF SPECIATION AND SENSITIVITIES ARE REQUIRED. Performed at [facility], [place] [address], [place], [place] [address]      Lab Results  Component Value Date   ALBUMIN 3.6 03/21/2019   ALBUMIN 4.1 03/17/2019   ALBUMIN 3.9 04/22/2018   LABURIC 5.7 07/13/2015    Lab Results  Component Value Date   MG 1.7 01/01/2017   MG 1.7 09/16/2016   MG 1.7 09/14/2016   No results found for: VD25OH  No results found for: PREALBUMIN CBC EXTENDED Latest Ref Rng & Units 03/30/2019 03/21/2019 03/21/2019  WBC 4.0 - 10.5 K/uL - 12.9(H) -  RBC 3.87 - 5.11 MIL/uL - 4.22 -  HGB 12.0 - 15.0 g/dL 13.6 12.1 13.6  HCT 36.0 -  46.0 % 40.0 38.6 40.0  PLT 150 - 400 K/uL - 265 -  NEUTROABS 1.7 - 7.7 K/uL - 8.9(H) -  LYMPHSABS 0.7 - 4.0 K/uL - 2.9 -     Body mass index is 27.46 kg/m.  Orders:  Orders Placed This Encounter  Procedures  . XR Foot Complete Right  . VAS [place] ABI WITH/WO TBI   No orders of the defined types were placed in this encounter.    Procedures: No procedures performed  Clinical Data: No additional findings.  ROS:  All other systems negative, except as noted in the HPI. Review of Systems  Constitutional: Negative for chills and fever.  Musculoskeletal: Positive for arthralgias, gait problem and myalgias.  Neurological: Negative for weakness and numbness.    Objective: Vital Signs: Ht 5\' 4"  (1.626 m)   Wt  160 lb (72.6 kg)   LMP  (LMP Unknown)   BMI 27.46 kg/m   Specialty Comments:  No specialty comments available.  PMFS History: Patient Active Problem List   Diagnosis Date Noted  . Multinodular goiter 08/31/2018  . Cardiac arrest ([place]) 11/22/2017  . DOE (dyspnea on exertion) 09/01/2017  . COPD GOLD  0 08/31/2017  . Diabetic polyneuropathy associated with type 2 diabetes mellitus ([place]) 08/19/2017  . NSTEMI (non-ST elevated myocardial infarction) ([place]) 01/01/2017  . Chest pain 01/01/2017  . Tobacco abuse   . PVD (peripheral vascular disease) ([place])   . Proteinuria   . Obesity   . Normocytic anemia   . Hypertension   . High cholesterol   . Foot amputation status   . Diabetes mellitus with neuropathy ([place])   . CKD (chronic kidney disease), stage IV ([place])   . Chronic diastolic CHF (congestive heart failure) ([place])   . CAD (coronary artery disease) 10/27/2016  . Chronic kidney disease 09/23/2016  . Acute congestive heart failure ([place])   . Hypertensive heart disease with heart failure ([place])   . Chronic combined systolic and diastolic heart failure ([place]) 09/14/2016  . Onychomycosis 12/24/2015  . Status post transmetatarsal amputation of foot, left ([place]) 09/06/2015  . Diabetic foot ulcer ([place]) 07/13/2015  . Cellulitis 07/12/2015  . HLD (hyperlipidemia) 09/02/2012  . Peripheral nerve disease 09/02/2012  . Type 2 diabetes mellitus with peripheral angiopathy ([place]) 09/02/2012  . Hypertriglyceridemia 07/28/2012  . Peripheral vascular disease ([place]) 07/28/2012  . Avitaminosis D 07/28/2012  . Essential (primary) hypertension 06/10/2012  . History of biliary T-tube placement 06/10/2012  . Leg pain 06/10/2012  . Current tobacco use 06/10/2012  . Atrophic vaginitis 06/07/2012  . Type 2 diabetes mellitus ([place]) 06/07/2012   Past Medical History:  Diagnosis Date  . Acute congestive heart failure ([place])   . Acute diastolic CHF (congestive heart failure) ([place]) 09/14/2016  . Atrophic  vaginitis 06/07/2012  . Avitaminosis D 07/28/2012  . CAD (coronary artery disease)    a. NSTEM 09/2016: cath showing severe diffuse disease of the RCA, ramus and Cx with mild-mod disease of LAD; PCI would require multiple stents and significant contrast usage thus medical therapy recommended.  . Cellulitis 07/12/2015  . Chest pain 01/01/2017  . Chronic diastolic CHF (congestive heart failure) ([place])   . Chronic kidney disease 09/23/2016  . CKD (chronic kidney disease), stage IV ([place])   . Constipation   . COPD GOLD  0 08/31/2017   Quit smoking 07/17/17 - Spirometry 09/01/2017  FEV1 1.23 (58%)  Ratio 57 with mild curvature p no rx - 09/01/2017  After extensive coaching inhaler device,  effectiveness =  75% with elipta with cough provoked  - PFT's  10/14/2017  FEV1 1.69 (68 % ) ratio 84  p no % improvement from saba p nothing prior to study with DLCO  50 % corrects to 76  % for alv volume   - 10/14/2017  Walked RA x 3 laps @ 1  . Current tobacco use 06/10/2012  . Diabetes mellitus with neuropathy ([place])   . Diabetic foot ulcer ([place]) 07/13/2015  . Diabetic polyneuropathy associated with type 2 diabetes mellitus ([place]) 08/19/2017  . DOE (dyspnea on exertion) 09/01/2017   09/01/2017  Walked RA x 3 laps @ 185 ft each stopped due to  End of study, nl to mod fast  pace, no  desat   But stopped to rest x 2  Spirometry 09/01/2017  FEV1 1.23 (58%)  Ratio 57  > trial of anoro    . Dyspnea    with exertion  . ESRD (end stage renal disease) ([place])    MWF - [facility]  . Essential (primary) hypertension 06/10/2012  . Foot amputation status    a. h/o left foot transmetatarsal amputation  . Headache    Migraines  . High cholesterol   . History of biliary T-tube placement 06/10/2012   Patient unaware   . History of cardiac arrest   . HLD (hyperlipidemia) 09/02/2012   Overview:  ICD-10 cut over    . Hypertension    a. patent renal arteries by PV angio 08/2015. b. heavy proteinuria 09/2016 ? nephrotic.  [name] Hypertensive heart  disease with heart failure ([place])   . Hypertriglyceridemia 07/28/2012  . Leg pain 06/10/2012  . Normocytic anemia    pt denies this dx  . NSTEMI (non-ST elevated myocardial infarction) ([place]) 01/01/2017  . Obesity   . Onychomycosis 12/24/2015  . Peripheral nerve disease 09/02/2012   legs  . Peripheral vascular disease ([place]) 07/28/2012  . Pneumonia 2018  . Proteinuria   . PVD (peripheral vascular disease) ([place])    a. status post left SFA and popliteal stent and left SFA PTA with drug coated balloon in 08/2015.  . Status post transmetatarsal amputation of foot, left ([place]) 09/06/2015  . Tobacco abuse    quit cigarettes 2019, smokes marijuana  . Type 2 diabetes mellitus ([place]) 06/07/2012  . Type 2 diabetes mellitus with peripheral angiopathy ([place]) 09/02/2012    Family History  Problem Relation Age of Onset  . Diabetes Mother   . Hypertension Father     Past Surgical History:  Procedure Laterality Date  . ABDOMINAL HYSTERECTOMY    . AMPUTATION Left 09/06/2015   Procedure: LEFT TRANSMETATARSAL AMPUTATTION;  Surgeon: Newt Minion, MD;  Location: [facility];  Service: Orthopedics;  Laterality: Left;  . AV FISTULA PLACEMENT Left 12/13/2018   Procedure: ARTERIOVENOUS (AV) FISTULA CREATION LEFT UPPER ARM;  Surgeon: Waynetta Sandy, MD;  Location: [facility];  Service: Vascular;  Laterality: Left;  . BASCILIC VEIN TRANSPOSITION Left 03/30/2019   Procedure: LEFT ARM ARTERIOVENOUS VEIN TRANSPOSITION;  Surgeon: Serafina Mitchell, MD;  Location: [facility];  Service: Vascular;  Laterality: Left;  . COLONOSCOPY     polyps x 1  . LEFT HEART CATH AND CORONARY ANGIOGRAPHY N/A 09/17/2016   Procedure: LEFT HEART CATH AND CORONARY ANGIOGRAPHY;  Surgeon: Jettie Booze, MD;  Location: [facility];  Service: Cardiovascular;  Laterality: N/A;  . PERIPHERAL VASCULAR CATHETERIZATION N/A 08/16/2015   Procedure: Abdominal Aortogram;  Surgeon: Elam Dutch, MD;  Location: [facility];  Service:  Cardiovascular;  Laterality: N/A;  . PERIPHERAL VASCULAR CATHETERIZATION Bilateral 08/16/2015   Procedure: Lower Extremity Angiography;  Surgeon: Elam Dutch, MD;  Location: [facility];  Service: Cardiovascular;  Laterality: Bilateral;  . PERIPHERAL VASCULAR CATHETERIZATION Left 08/16/2015   Procedure: Peripheral Vascular Intervention;  Surgeon: Elam Dutch, MD;  Location: [facility];  Service: Cardiovascular;  Laterality: Left;  SFA STENT X 2   Social History   Occupational History  . Not on file  Tobacco Use  . Smoking status: Former Smoker    Packs/day: 1.00    Years: 42.00    Pack years: 42.00    Types: Cigarettes    Quit date: 07/17/2017    Years since quitting: 1.7  . Smokeless tobacco: Never Used  Substance and Sexual Activity  . Alcohol use: Not Currently    Alcohol/week: 1.0 - 2.0 standard drinks    Types: 1 - 2 Glasses of wine per week    Comment: on social occassions  . Drug use: Yes    Frequency: 7.0 times per week    Types: Marijuana    Comment: daily use - last use 03/28/19  . Sexual activity: Not on file    Comment: Hysterectomy

## 2019-04-19 NOTE — Telephone Encounter (Signed)
The above patient or their representative was contacted and gave the following answers to these questions:         Do you have any of the following symptoms?    NO  Fever                    Cough                   Shortness of breath  Do  you have any of the following other symptoms?    muscle pain         vomiting,        diarrhea        rash         weakness        red eye        abdominal pain         bruising          bruising or bleeding              joint pain           severe headache    Have you been in contact with someone who was or has been sick in the past 2 weeks?  NO  Yes                 Unsure                         Unable to assess   Does the person that you were in contact with have any of the following symptoms?   Cough         shortness of breath           muscle pain         vomiting,            diarrhea            rash            weakness           fever            red eye           abdominal pain           bruising  or  bleeding                joint pain                severe headache                 COMMENTS OR ACTION PLAN FOR THIS PATIENT:   NO PER PATIENT 04/19/2019  KAC

## 2019-04-20 ENCOUNTER — Ambulatory Visit (HOSPITAL_COMMUNITY)
Admission: RE | Admit: 2019-04-20 | Discharge: 2019-04-20 | Disposition: A | Discharge status: Home / Self Care | Payer: Medicare Other | Source: Ambulatory Visit | Attending: Vascular Surgery | Admitting: Vascular Surgery

## 2019-04-20 DIAGNOSIS — M79671 Pain in right foot: Secondary | ICD-10-CM | POA: Diagnosis present

## 2019-04-21 ENCOUNTER — Other Ambulatory Visit (HOSPITAL_COMMUNITY): Payer: Medicare Other

## 2019-04-24 ENCOUNTER — Ambulatory Visit (INDEPENDENT_AMBULATORY_CARE_PROVIDER_SITE_OTHER): Payer: Medicare Other | Admitting: Physician Assistant

## 2019-04-24 ENCOUNTER — Encounter: Payer: Self-pay | Admitting: Physician Assistant

## 2019-04-24 ENCOUNTER — Other Ambulatory Visit: Payer: Self-pay

## 2019-04-24 DIAGNOSIS — I739 Peripheral vascular disease, unspecified: Secondary | ICD-10-CM | POA: Diagnosis not present

## 2019-04-24 NOTE — Progress Notes (Signed)
Office Visit Note   Patient: Rebekah Johnson           Date of Birth: 1960-12-05           MRN: [id number] Visit Date: 04/24/2019              Requested by: No referring provider defined for this encounter. PCP: Dallas Schimke, MD  Chief Complaint  Patient presents with  . Right Foot - Follow-up      HPI: This is a pleasant 59 year old woman who is following up today for her right foot injury.  At her last visit there were concerns given the mechanism of injury and her pain and swelling and inability to bear weight that she might have a Lisfranc injury.  She is been in a boot and reports that she feels much better.  She is able to walk with the boot without much pain.  She actually had to drive today and did not have any trouble with pain while she was driving she still has a little bit of swelling.  She also had ABIs of her right lower extremity because of monophasic Doppler pulses.  The results have not changed significantly since her previous study per the report  Assessment & Plan: Visit Diagnoses: No diagnosis found.  Plan: She will continue to weight-bear in the boot.  She may transition to a supportive shoe when she feels comfortable and it is not painful.  Follow-up for final visit in 3 weeks.  Follow-Up Instructions: No follow-ups on file.   Ortho Exam  Patient is alert, oriented, no adenopathy, well-dressed, normal affect, normal respiratory effort. Focused examination of her right foot demonstrates mild soft tissue swelling.  She has no plantar ecchymosis and denies that she is ever had any.  No tenderness with manipulation of the Lisfranc complex.  No cellulitis  Imaging: No results found. No images are attached to the encounter.  Labs: Lab Results  Component Value Date   HGBA1C 6.9 (A) 02/02/2019   HGBA1C 5.6 11/01/2018   HGBA1C 5.5 08/31/2018   ESRSEDRATE 57 (H) 07/13/2015   CRP 0.7 07/13/2015   LABURIC 5.7 07/13/2015   REPTSTATUS 01/04/2017 FINAL  01/04/2017   CULT (A) 01/01/2017    VIRIDANS STREPTOCOCCUS THE SIGNIFICANCE OF ISOLATING THIS ORGANISM FROM A SINGLE SET OF BLOOD CULTURES WHEN MULTIPLE SETS ARE DRAWN IS UNCERTAIN. PLEASE NOTIFY THE MICROBIOLOGY DEPARTMENT WITHIN ONE WEEK IF SPECIATION AND SENSITIVITIES ARE REQUIRED. Performed at [facility], [place] [address] [place], [place] [address]      Lab Results  Component Value Date   ALBUMIN 3.6 03/21/2019   ALBUMIN 4.1 03/17/2019   ALBUMIN 3.9 04/22/2018   LABURIC 5.7 07/13/2015    Lab Results  Component Value Date   MG 1.7 01/01/2017   MG 1.7 09/16/2016   MG 1.7 09/14/2016   No results found for: VD25OH  No results found for: PREALBUMIN CBC EXTENDED Latest Ref Rng & Units 03/30/2019 03/21/2019 03/21/2019  WBC 4.0 - 10.5 K/uL - 12.9(H) -  RBC 3.87 - 5.11 MIL/uL - 4.22 -  HGB 12.0 - 15.0 g/dL 13.6 12.1 13.6  HCT 36.0 - 46.0 % 40.0 38.6 40.0  PLT 150 - 400 K/uL - 265 -  NEUTROABS 1.7 - 7.7 K/uL - 8.9(H) -  LYMPHSABS 0.7 - 4.0 K/uL - 2.9 -     There is no height or weight on file to calculate BMI.  Orders:  No orders of the defined types were  placed in this encounter.  No orders of the defined types were placed in this encounter.    Procedures: No procedures performed  Clinical Data: No additional findings.  ROS:  All other systems negative, except as noted in the HPI. Review of Systems  Objective: Vital Signs: LMP  (LMP Unknown)   Specialty Comments:  No specialty comments available.  PMFS History: Patient Active Problem List   Diagnosis Date Noted  . Multinodular goiter 08/31/2018  . Cardiac arrest ([place]) 11/22/2017  . DOE (dyspnea on exertion) 09/01/2017  . COPD GOLD  0 08/31/2017  . Diabetic polyneuropathy associated with type 2 diabetes mellitus ([place]) 08/19/2017  . NSTEMI (non-ST elevated myocardial infarction) ([place]) 01/01/2017  . Chest pain 01/01/2017  . Tobacco abuse   . PVD (peripheral vascular disease) ([place])   . Proteinuria    . Obesity   . Normocytic anemia   . Hypertension   . High cholesterol   . Foot amputation status   . Diabetes mellitus with neuropathy ([place])   . CKD (chronic kidney disease), stage IV ([place])   . Chronic diastolic CHF (congestive heart failure) ([place])   . CAD (coronary artery disease) 10/27/2016  . Chronic kidney disease 09/23/2016  . Acute congestive heart failure ([place])   . Hypertensive heart disease with heart failure ([place])   . Chronic combined systolic and diastolic heart failure ([place]) 09/14/2016  . Onychomycosis 12/24/2015  . Status post transmetatarsal amputation of foot, left ([place]) 09/06/2015  . Diabetic foot ulcer ([place]) 07/13/2015  . Cellulitis 07/12/2015  . HLD (hyperlipidemia) 09/02/2012  . Peripheral nerve disease 09/02/2012  . Type 2 diabetes mellitus with peripheral angiopathy ([place]) 09/02/2012  . Hypertriglyceridemia 07/28/2012  . Peripheral vascular disease ([place]) 07/28/2012  . Avitaminosis D 07/28/2012  . Essential (primary) hypertension 06/10/2012  . History of biliary T-tube placement 06/10/2012  . Leg pain 06/10/2012  . Current tobacco use 06/10/2012  . Atrophic vaginitis 06/07/2012  . Type 2 diabetes mellitus ([place]) 06/07/2012   Past Medical History:  Diagnosis Date  . Acute congestive heart failure ([place])   . Acute diastolic CHF (congestive heart failure) ([place]) 09/14/2016  . Atrophic vaginitis 06/07/2012  . Avitaminosis D 07/28/2012  . CAD (coronary artery disease)    a. NSTEM 09/2016: cath showing severe diffuse disease of the RCA, ramus and Cx with mild-mod disease of LAD; PCI would require multiple stents and significant contrast usage thus medical therapy recommended.  . Cellulitis 07/12/2015  . Chest pain 01/01/2017  . Chronic diastolic CHF (congestive heart failure) ([place])   . Chronic kidney disease 09/23/2016  . CKD (chronic kidney disease), stage IV ([place])   . Constipation   . COPD GOLD  0 08/31/2017   Quit smoking 07/17/17 - Spirometry 09/01/2017  FEV1 1.23  (58%)  Ratio 57 with mild curvature p no rx - 09/01/2017  After extensive coaching inhaler device,  effectiveness =    75% with elipta with cough provoked  - PFT's  10/14/2017  FEV1 1.69 (68 % ) ratio 84  p no % improvement from saba p nothing prior to study with DLCO  50 % corrects to 76  % for alv volume   - 10/14/2017  Walked RA x 3 laps @ 1  . Current tobacco use 06/10/2012  . Diabetes mellitus with neuropathy ([place])   . Diabetic foot ulcer ([place]) 07/13/2015  . Diabetic polyneuropathy associated with type 2 diabetes mellitus ([place]) 08/19/2017  . DOE (dyspnea on exertion) 09/01/2017   09/01/2017  Walked RA  x 3 laps @ 185 ft each stopped due to  End of study, nl to mod fast  pace, no  desat   But stopped to rest x 2  Spirometry 09/01/2017  FEV1 1.23 (58%)  Ratio 57  > trial of anoro    . Dyspnea    with exertion  . ESRD (end stage renal disease) ([place])    MWF - [facility]  . Essential (primary) hypertension 06/10/2012  . Foot amputation status    a. h/o left foot transmetatarsal amputation  . Headache    Migraines  . High cholesterol   . History of biliary T-tube placement 06/10/2012   Patient unaware   . History of cardiac arrest   . HLD (hyperlipidemia) 09/02/2012   Overview:  ICD-10 cut over    . Hypertension    a. patent renal arteries by PV angio 08/2015. b. heavy proteinuria 09/2016 ? nephrotic.  [name] Hypertensive heart disease with heart failure ([place])   . Hypertriglyceridemia 07/28/2012  . Leg pain 06/10/2012  . Normocytic anemia    pt denies this dx  . NSTEMI (non-ST elevated myocardial infarction) ([place]) 01/01/2017  . Obesity   . Onychomycosis 12/24/2015  . Peripheral nerve disease 09/02/2012   legs  . Peripheral vascular disease ([place]) 07/28/2012  . Pneumonia 2018  . Proteinuria   . PVD (peripheral vascular disease) ([place])    a. status post left SFA and popliteal stent and left SFA PTA with drug coated balloon in 08/2015.  . Status post transmetatarsal amputation of foot, left ([place]) 09/06/2015    . Tobacco abuse    quit cigarettes 2019, smokes marijuana  . Type 2 diabetes mellitus ([place]) 06/07/2012  . Type 2 diabetes mellitus with peripheral angiopathy ([place]) 09/02/2012    Family History  Problem Relation Age of Onset  . Diabetes Mother   . Hypertension Father     Past Surgical History:  Procedure Laterality Date  . ABDOMINAL HYSTERECTOMY    . AMPUTATION Left 09/06/2015   Procedure: LEFT TRANSMETATARSAL AMPUTATTION;  Surgeon: Newt Minion, MD;  Location: [facility];  Service: Orthopedics;  Laterality: Left;  . AV FISTULA PLACEMENT Left 12/13/2018   Procedure: ARTERIOVENOUS (AV) FISTULA CREATION LEFT UPPER ARM;  Surgeon: Waynetta Sandy, MD;  Location: [facility];  Service: Vascular;  Laterality: Left;  . BASCILIC VEIN TRANSPOSITION Left 03/30/2019   Procedure: LEFT ARM ARTERIOVENOUS VEIN TRANSPOSITION;  Surgeon: Serafina Mitchell, MD;  Location: [facility];  Service: Vascular;  Laterality: Left;  . COLONOSCOPY     polyps x 1  . LEFT HEART CATH AND CORONARY ANGIOGRAPHY N/A 09/17/2016   Procedure: LEFT HEART CATH AND CORONARY ANGIOGRAPHY;  Surgeon: Jettie Booze, MD;  Location: [facility];  Service: Cardiovascular;  Laterality: N/A;  . PERIPHERAL VASCULAR CATHETERIZATION N/A 08/16/2015   Procedure: Abdominal Aortogram;  Surgeon: Elam Dutch, MD;  Location: [facility];  Service: Cardiovascular;  Laterality: N/A;  . PERIPHERAL VASCULAR CATHETERIZATION Bilateral 08/16/2015   Procedure: Lower Extremity Angiography;  Surgeon: Elam Dutch, MD;  Location: [facility];  Service: Cardiovascular;  Laterality: Bilateral;  . PERIPHERAL VASCULAR CATHETERIZATION Left 08/16/2015   Procedure: Peripheral Vascular Intervention;  Surgeon: Elam Dutch, MD;  Location: [facility];  Service: Cardiovascular;  Laterality: Left;  SFA STENT X 2   Social History   Occupational History  . Not on file  Tobacco Use  . Smoking status: Former Smoker    Packs/day: 1.00  Years: 42.00    Pack years: 42.00    Types: Cigarettes    Quit date: 07/17/2017    Years since quitting: 1.7  . Smokeless tobacco: Never Used  Substance and Sexual Activity  . Alcohol use: Not Currently    Alcohol/week: 1.0 - 2.0 standard drinks    Types: 1 - 2 Glasses of wine per week    Comment: on social occassions  . Drug use: Yes    Frequency: 7.0 times per week    Types: Marijuana    Comment: daily use - last use 03/28/19  . Sexual activity: Not on file    Comment: Hysterectomy

## 2019-04-27 ENCOUNTER — Inpatient Hospital Stay: Payer: Medicare Other | Attending: Oncology

## 2019-04-27 ENCOUNTER — Other Ambulatory Visit: Payer: Self-pay

## 2019-04-27 ENCOUNTER — Telehealth: Payer: Self-pay

## 2019-04-27 DIAGNOSIS — D472 Monoclonal gammopathy: Secondary | ICD-10-CM | POA: Insufficient documentation

## 2019-04-27 DIAGNOSIS — N289 Disorder of kidney and ureter, unspecified: Secondary | ICD-10-CM | POA: Diagnosis not present

## 2019-04-27 DIAGNOSIS — D649 Anemia, unspecified: Secondary | ICD-10-CM | POA: Diagnosis not present

## 2019-04-27 LAB — CBC WITH DIFFERENTIAL (CANCER CENTER ONLY)
Abs Immature Granulocytes: 0.03 10*3/uL (ref 0.00–0.07)
Basophils Absolute: 0.1 10*3/uL (ref 0.0–0.1)
Basophils Relative: 1 %
Eosinophils Absolute: 0.1 10*3/uL (ref 0.0–0.5)
Eosinophils Relative: 1 %
HCT: 41.6 % (ref 36.0–46.0)
Hemoglobin: 12.9 g/dL (ref 12.0–15.0)
Immature Granulocytes: 0 %
Lymphocytes Relative: 22 %
Lymphs Abs: 1.9 10*3/uL (ref 0.7–4.0)
MCH: 28.7 pg (ref 26.0–34.0)
MCHC: 31 g/dL (ref 30.0–36.0)
MCV: 92.7 fL (ref 80.0–100.0)
Monocytes Absolute: 0.5 10*3/uL (ref 0.1–1.0)
Monocytes Relative: 6 %
Neutro Abs: 5.9 10*3/uL (ref 1.7–7.7)
Neutrophils Relative %: 70 %
Platelet Count: 334 10*3/uL (ref 150–400)
RBC: 4.49 MIL/uL (ref 3.87–5.11)
RDW: 15.2 % (ref 11.5–15.5)
WBC Count: 8.5 10*3/uL (ref 4.0–10.5)
nRBC: 0 % (ref 0.0–0.2)

## 2019-04-27 LAB — CMP (CANCER CENTER ONLY)
ALT: 14 U/L (ref 0–44)
AST: 14 U/L — ABNORMAL LOW (ref 15–41)
Albumin: 3.6 g/dL (ref 3.5–5.0)
Alkaline Phosphatase: 165 U/L — ABNORMAL HIGH (ref 38–126)
Anion gap: 16 — ABNORMAL HIGH (ref 5–15)
BUN: 42 mg/dL — ABNORMAL HIGH (ref 8–23)
CO2: 32 mmol/L (ref 22–32)
Calcium: 9.7 mg/dL (ref 8.9–10.3)
Chloride: 92 mmol/L — ABNORMAL LOW (ref 98–111)
Creatinine: 4.15 mg/dL (ref 0.44–1.00)
GFR, Est AFR Am: 13 mL/min — ABNORMAL LOW (ref >60)
GFR, Estimated: 11 mL/min — ABNORMAL LOW (ref >60)
Glucose, Bld: 231 mg/dL — ABNORMAL HIGH (ref 70–99)
Potassium: 3.9 mmol/L (ref 3.5–5.1)
Sodium: 140 mmol/L (ref 135–145)
Total Bilirubin: 0.3 mg/dL (ref 0.3–1.2)
Total Protein: 8.3 g/dL — ABNORMAL HIGH (ref 6.5–8.1)

## 2019-04-27 NOTE — Telephone Encounter (Signed)
Received a call from H. Flynt in the lab at 10:48  to report critical creatinine 4.15. Dr. Alen Blew made aware at 1050. No new orders received at this time.

## 2019-04-28 ENCOUNTER — Other Ambulatory Visit: Payer: Medicare Other

## 2019-04-28 LAB — KAPPA/LAMBDA LIGHT CHAINS
Kappa free light chain: 111.9 mg/L — ABNORMAL HIGH (ref 3.3–19.4)
Kappa, lambda light chain ratio: 1.71 — ABNORMAL HIGH (ref 0.26–1.65)
Lambda free light chains: 65.5 mg/L — ABNORMAL HIGH (ref 5.7–26.3)

## 2019-05-01 LAB — MULTIPLE MYELOMA PANEL, SERUM
Albumin SerPl Elph-Mcnc: 3.7 g/dL (ref 2.9–4.4)
Albumin/Glob SerPl: 1 (ref 0.7–1.7)
Alpha 1: 0.2 g/dL (ref 0.0–0.4)
Alpha2 Glob SerPl Elph-Mcnc: 1.1 g/dL — ABNORMAL HIGH (ref 0.4–1.0)
B-Globulin SerPl Elph-Mcnc: 1 g/dL (ref 0.7–1.3)
Gamma Glob SerPl Elph-Mcnc: 1.6 g/dL (ref 0.4–1.8)
Globulin, Total: 3.9 g/dL (ref 2.2–3.9)
IgA: 177 mg/dL (ref 87–352)
IgG (Immunoglobin G), Serum: 1618 mg/dL — ABNORMAL HIGH (ref 586–1602)
IgM (Immunoglobulin M), Srm: 76 mg/dL (ref 26–217)
M Protein SerPl Elph-Mcnc: 0.6 g/dL — ABNORMAL HIGH
Total Protein ELP: 7.6 g/dL (ref 6.0–8.5)

## 2019-05-04 ENCOUNTER — Inpatient Hospital Stay: Payer: Medicare Other | Admitting: Oncology

## 2019-05-05 ENCOUNTER — Ambulatory Visit: Payer: Medicare Other | Admitting: Oncology

## 2019-05-15 ENCOUNTER — Ambulatory Visit (HOSPITAL_COMMUNITY): Payer: Medicare Other | Attending: Cardiology

## 2019-05-15 ENCOUNTER — Ambulatory Visit: Payer: Medicare Other | Admitting: Physician Assistant

## 2019-05-15 ENCOUNTER — Other Ambulatory Visit: Payer: Self-pay

## 2019-05-15 DIAGNOSIS — I340 Nonrheumatic mitral (valve) insufficiency: Secondary | ICD-10-CM | POA: Insufficient documentation

## 2019-05-16 ENCOUNTER — Telehealth: Payer: Self-pay | Admitting: Cardiology

## 2019-05-16 ENCOUNTER — Ambulatory Visit (INDEPENDENT_AMBULATORY_CARE_PROVIDER_SITE_OTHER): Payer: Medicare Other | Admitting: Physician Assistant

## 2019-05-16 ENCOUNTER — Encounter: Payer: Self-pay | Admitting: Orthopedic Surgery

## 2019-05-16 DIAGNOSIS — B351 Tinea unguium: Secondary | ICD-10-CM

## 2019-05-16 NOTE — Telephone Encounter (Signed)
Rebekah Johnson is returning Ivy's call in regards to her Echo results. Please advise.

## 2019-05-16 NOTE — Progress Notes (Signed)
Office Visit Note   Patient: Rebekah Johnson           Date of Birth: April 01, 1960           MRN: [id number] Visit Date: 05/16/2019              Requested by: No referring provider defined for this encounter. PCP: Dallas Schimke, MD  Chief Complaint  Patient presents with  . Left Leg - Routine Post Op      HPI: This is a pleasant woman who is 8 months status post left transmetatarsal amputation.  She also comes in to have her nails trimmed.  She denies any pain or swelling she is looking at her feet daily  Assessment & Plan: Visit Diagnoses: No diagnosis found.  Plan: She will follow up in 2 months or sooner if she is having any issues  Follow-Up Instructions: No follow-ups on file.   Ortho Exam  Patient is alert, oriented, no adenopathy, well-dressed, normal affect, normal respiratory effort. Bilateral feet the right foot is in good condition onychomycotic nails which were trimmed after obtaining verbal permission.  Left transmetatarsal amputation stump is completely healed without cellulitis or swelling.  She does have a small callus which was trimmed back to a more softer edge.  No bleeding was encountered  Imaging: ECHOCARDIOGRAM COMPLETE  Result Date: 05/15/2019    ECHOCARDIOGRAM REPORT   Patient Name:   Rebekah Johnson Date of Exam: 05/15/2019 Medical Rec #:  [id number]         Height:       64.0 in Accession #:    [id number]        Weight:       160.0 lb Date of Birth:  14-Nov-1960          BSA:          1.779 m Patient Age:    59 years          BP:           118/47 mmHg Patient Gender: F                 HR:           87 bpm. Exam Location:  [place] Procedure: 2D Echo, Cardiac Doppler and Color Doppler Indications:    I34.0  History:        Patient has prior history of Echocardiogram examinations, most                 recent 09/02/2017. CHF, Previous Myocardial Infarction, MR; Risk                 Factors:Hypertension, Dyslipidemia and Diabetes.  Sonographer:     Coralyn Helling RDCS Referring Phys: [id number] [place]  1. Left ventricular ejection fraction, by estimation, is 55 to 60%. The left ventricle has normal function. The left ventricle has no regional wall motion abnormalities. There is severe left ventricular hypertrophy of the basal-septal segment. Left ventricular diastolic parameters are consistent with Grade I diastolic dysfunction (impaired relaxation). Elevated left ventricular end-diastolic pressure. There is hypokinesis of the left ventricular, basal-mid inferolateral wall.  2. Right ventricular systolic function is normal. The right ventricular size is normal. Tricuspid regurgitation signal is inadequate for assessing PA pressure.  3. The mitral valve is normal in structure. Mild mitral valve regurgitation. No evidence of mitral stenosis.  4. The aortic valve is tricuspid. Aortic valve regurgitation is not visualized. Mild to moderate  aortic valve sclerosis/calcification is present, without any evidence of aortic stenosis.  5. The inferior vena cava is normal in size with greater than 50% respiratory variability, suggesting right atrial pressure of 3 mmHg. FINDINGS  Left Ventricle: Left ventricular ejection fraction, by estimation, is 55 to 60%. The left ventricle has normal function. The left ventricle has no regional wall motion abnormalities. The left ventricular internal cavity size was normal in size. There is  severe left ventricular hypertrophy of the basal-septal segment. Left ventricular diastolic parameters are consistent with Grade I diastolic dysfunction (impaired relaxation). Elevated left ventricular end-diastolic pressure. Right Ventricle: The right ventricular size is normal. No increase in right ventricular wall thickness. Right ventricular systolic function is normal. Tricuspid regurgitation signal is inadequate for assessing PA pressure. Left Atrium: Left atrial size was normal in size. Right Atrium: Right atrial size  was normal in size. Pericardium: Trivial pericardial effusion is present. The pericardial effusion is posterior to the left ventricle. Mitral Valve: The mitral valve is normal in structure. Normal mobility of the mitral valve leaflets. Mild mitral valve regurgitation. No evidence of mitral valve stenosis. Tricuspid Valve: The tricuspid valve is normal in structure. Tricuspid valve regurgitation is not demonstrated. No evidence of tricuspid stenosis. Aortic Valve: The aortic valve is tricuspid. Aortic valve regurgitation is not visualized. Mild to moderate aortic valve sclerosis/calcification is present, without any evidence of aortic stenosis. Pulmonic Valve: The pulmonic valve was normal in structure. Pulmonic valve regurgitation is not visualized. No evidence of pulmonic stenosis. Aorta: The aortic root is normal in size and structure. Venous: The inferior vena cava is normal in size with greater than 50% respiratory variability, suggesting right atrial pressure of 3 mmHg. IAS/Shunts: No atrial level shunt detected by color flow Doppler.  LEFT VENTRICLE PLAX 2D LVIDd:         4.40 cm  Diastology LVIDs:         3.35 cm  LV e' lateral:   7.18 cm/s LV PW:         1.10 cm  LV E/e' lateral: 8.9 LV IVS:        1.50 cm  LV e' medial:    3.15 cm/s LVOT diam:     1.80 cm  LV E/e' medial:  20.2 LV SV:         47 LV SV Index:   27 LVOT Area:     2.54 cm  RIGHT VENTRICLE             IVC RV S prime:     11.50 cm/s  IVC diam: 0.80 cm TAPSE (M-mode): 1.6 cm LEFT ATRIUM             Index       RIGHT ATRIUM           Index LA diam:        2.90 cm 1.63 cm/m  RA Pressure: 3.00 mmHg LA Vol (A2C):   34.4 ml 19.33 ml/m RA Area:     12.70 cm LA Vol (A4C):   50.7 ml 28.49 ml/m RA Volume:   31.50 ml  17.70 ml/m LA Biplane Vol: 44.0 ml 24.73 ml/m  AORTIC VALVE LVOT Vmax:   104.00 cm/s LVOT Vmean:  63.400 cm/s LVOT VTI:    0.186 m  AORTA Ao Root diam: 3.40 cm Ao Asc diam:  3.30 cm MV E velocity: 63.70 cm/s  TRICUSPID VALVE MV A  velocity: 85.10 cm/s  Estimated RAP:  3.00 mmHg MV E/A ratio:  0.75                            SHUNTS                            Systemic VTI:  0.19 m                            Systemic Diam: 1.80 cm Fransico Him MD Electronically signed by Fransico Him MD Signature Date/Time: 05/15/2019/5:03:59 PM    Final    No images are attached to the encounter.  Labs: Lab Results  Component Value Date   HGBA1C 6.9 (A) 02/02/2019   HGBA1C 5.6 11/01/2018   HGBA1C 5.5 08/31/2018   ESRSEDRATE 57 (H) 07/13/2015   CRP 0.7 07/13/2015   LABURIC 5.7 07/13/2015   REPTSTATUS 01/04/2017 FINAL 01/04/2017   CULT (A) 01/01/2017    VIRIDANS STREPTOCOCCUS THE SIGNIFICANCE OF ISOLATING THIS ORGANISM FROM A SINGLE SET OF BLOOD CULTURES WHEN MULTIPLE SETS ARE DRAWN IS UNCERTAIN. PLEASE NOTIFY THE MICROBIOLOGY DEPARTMENT WITHIN ONE WEEK IF SPECIATION AND SENSITIVITIES ARE REQUIRED. Performed at [facility], [place] [address] [place], [place] [address]      Lab Results  Component Value Date   ALBUMIN 3.6 04/27/2019   ALBUMIN 3.6 03/21/2019   ALBUMIN 4.1 03/17/2019   LABURIC 5.7 07/13/2015    Lab Results  Component Value Date   MG 1.7 01/01/2017   MG 1.7 09/16/2016   MG 1.7 09/14/2016   No results found for: VD25OH  No results found for: PREALBUMIN CBC EXTENDED Latest Ref Rng & Units 04/27/2019 03/30/2019 03/21/2019  WBC 4.0 - 10.5 K/uL 8.5 - 12.9(H)  RBC 3.87 - 5.11 MIL/uL 4.49 - 4.22  HGB 12.0 - 15.0 g/dL 12.9 13.6 12.1  HCT 36.0 - 46.0 % 41.6 40.0 38.6  PLT 150 - 400 K/uL 334 - 265  NEUTROABS 1.7 - 7.7 K/uL 5.9 - 8.9(H)  LYMPHSABS 0.7 - 4.0 K/uL 1.9 - 2.9     There is no height or weight on file to calculate BMI.  Orders:  No orders of the defined types were placed in this encounter.  No orders of the defined types were placed in this encounter.    Procedures: No procedures performed  Clinical Data: No additional findings.  ROS:  All other systems negative, except as noted in the  HPI. Review of Systems  Objective: Vital Signs: LMP  (LMP Unknown)   Specialty Comments:  No specialty comments available.  PMFS History: Patient Active Problem List   Diagnosis Date Noted  . Multinodular goiter 08/31/2018  . Cardiac arrest ([place]) 11/22/2017  . DOE (dyspnea on exertion) 09/01/2017  . COPD GOLD  0 08/31/2017  . Diabetic polyneuropathy associated with type 2 diabetes mellitus ([place]) 08/19/2017  . NSTEMI (non-ST elevated myocardial infarction) ([place]) 01/01/2017  . Chest pain 01/01/2017  . Tobacco abuse   . PVD (peripheral vascular disease) ([place])   . Proteinuria   . Obesity   . Normocytic anemia   . Hypertension   . High cholesterol   . Foot amputation status   . Diabetes mellitus with neuropathy ([place])   . CKD (chronic kidney disease), stage IV ([place])   . Chronic diastolic CHF (congestive heart failure) ([place])   . CAD (coronary artery disease) 10/27/2016  . Chronic kidney disease 09/23/2016  . Acute congestive  heart failure ([place])   . Hypertensive heart disease with heart failure ([place])   . Chronic combined systolic and diastolic heart failure ([place]) 09/14/2016  . Onychomycosis 12/24/2015  . Status post transmetatarsal amputation of foot, left ([place]) 09/06/2015  . Diabetic foot ulcer ([place]) 07/13/2015  . Cellulitis 07/12/2015  . HLD (hyperlipidemia) 09/02/2012  . Peripheral nerve disease 09/02/2012  . Type 2 diabetes mellitus with peripheral angiopathy ([place]) 09/02/2012  . Hypertriglyceridemia 07/28/2012  . Peripheral vascular disease ([place]) 07/28/2012  . Avitaminosis D 07/28/2012  . Essential (primary) hypertension 06/10/2012  . History of biliary T-tube placement 06/10/2012  . Leg pain 06/10/2012  . Current tobacco use 06/10/2012  . Atrophic vaginitis 06/07/2012  . Type 2 diabetes mellitus ([place]) 06/07/2012   Past Medical History:  Diagnosis Date  . Acute congestive heart failure ([place])   . Acute diastolic CHF (congestive heart failure) ([place]) 09/14/2016  .  Atrophic vaginitis 06/07/2012  . Avitaminosis D 07/28/2012  . CAD (coronary artery disease)    a. NSTEM 09/2016: cath showing severe diffuse disease of the RCA, ramus and Cx with mild-mod disease of LAD; PCI would require multiple stents and significant contrast usage thus medical therapy recommended.  . Cellulitis 07/12/2015  . Chest pain 01/01/2017  . Chronic diastolic CHF (congestive heart failure) ([place])   . Chronic kidney disease 09/23/2016  . CKD (chronic kidney disease), stage IV ([place])   . Constipation   . COPD GOLD  0 08/31/2017   Quit smoking 07/17/17 - Spirometry 09/01/2017  FEV1 1.23 (58%)  Ratio 57 with mild curvature p no rx - 09/01/2017  After extensive coaching inhaler device,  effectiveness =    75% with elipta with cough provoked  - PFT's  10/14/2017  FEV1 1.69 (68 % ) ratio 84  p no % improvement from saba p nothing prior to study with DLCO  50 % corrects to 76  % for alv volume   - 10/14/2017  Walked RA x 3 laps @ 1  . Current tobacco use 06/10/2012  . Diabetes mellitus with neuropathy ([place])   . Diabetic foot ulcer ([place]) 07/13/2015  . Diabetic polyneuropathy associated with type 2 diabetes mellitus ([place]) 08/19/2017  . DOE (dyspnea on exertion) 09/01/2017   09/01/2017  Walked RA x 3 laps @ 185 ft each stopped due to  End of study, nl to mod fast  pace, no  desat   But stopped to rest x 2  Spirometry 09/01/2017  FEV1 1.23 (58%)  Ratio 57  > trial of anoro    . Dyspnea    with exertion  . ESRD (end stage renal disease) ([place])    MWF - [facility]  . Essential (primary) hypertension 06/10/2012  . Foot amputation status    a. h/o left foot transmetatarsal amputation  . Headache    Migraines  . High cholesterol   . History of biliary T-tube placement 06/10/2012   Patient unaware   . History of cardiac arrest   . HLD (hyperlipidemia) 09/02/2012   Overview:  ICD-10 cut over    . Hypertension    a. patent renal arteries by PV angio 08/2015. b. heavy proteinuria 09/2016 ? nephrotic.  [name]  Hypertensive heart disease with heart failure ([place])   . Hypertriglyceridemia 07/28/2012  . Leg pain 06/10/2012  . Normocytic anemia    pt denies this dx  . NSTEMI (non-ST elevated myocardial infarction) ([place]) 01/01/2017  . Obesity   . Onychomycosis 12/24/2015  . Peripheral nerve disease 09/02/2012  legs  . Peripheral vascular disease ([place]) 07/28/2012  . Pneumonia 2018  . Proteinuria   . PVD (peripheral vascular disease) ([place])    a. status post left SFA and popliteal stent and left SFA PTA with drug coated balloon in 08/2015.  . Status post transmetatarsal amputation of foot, left ([place]) 09/06/2015  . Tobacco abuse    quit cigarettes 2019, smokes marijuana  . Type 2 diabetes mellitus ([place]) 06/07/2012  . Type 2 diabetes mellitus with peripheral angiopathy ([place]) 09/02/2012    Family History  Problem Relation Age of Onset  . Diabetes Mother   . Hypertension Father     Past Surgical History:  Procedure Laterality Date  . ABDOMINAL HYSTERECTOMY    . AMPUTATION Left 09/06/2015   Procedure: LEFT TRANSMETATARSAL AMPUTATTION;  Surgeon: Newt Minion, MD;  Location: [facility];  Service: Orthopedics;  Laterality: Left;  . AV FISTULA PLACEMENT Left 12/13/2018   Procedure: ARTERIOVENOUS (AV) FISTULA CREATION LEFT UPPER ARM;  Surgeon: Waynetta Sandy, MD;  Location: [facility];  Service: Vascular;  Laterality: Left;  . BASCILIC VEIN TRANSPOSITION Left 03/30/2019   Procedure: LEFT ARM ARTERIOVENOUS VEIN TRANSPOSITION;  Surgeon: Serafina Mitchell, MD;  Location: [facility];  Service: Vascular;  Laterality: Left;  . COLONOSCOPY     polyps x 1  . LEFT HEART CATH AND CORONARY ANGIOGRAPHY N/A 09/17/2016   Procedure: LEFT HEART CATH AND CORONARY ANGIOGRAPHY;  Surgeon: Jettie Booze, MD;  Location:  CV LAB;  Service: Cardiovascular;  Laterality: N/A;  . PERIPHERAL VASCULAR CATHETERIZATION N/A 08/16/2015   Procedure: Abdominal Aortogram;  Surgeon: Elam Dutch, MD;  Location: [facility];   Service: Cardiovascular;  Laterality: N/A;  . PERIPHERAL VASCULAR CATHETERIZATION Bilateral 08/16/2015   Procedure: Lower Extremity Angiography;  Surgeon: Elam Dutch, MD;  Location: [facility];  Service: Cardiovascular;  Laterality: Bilateral;  . PERIPHERAL VASCULAR CATHETERIZATION Left 08/16/2015   Procedure: Peripheral Vascular Intervention;  Surgeon: Elam Dutch, MD;  Location: [facility];  Service: Cardiovascular;  Laterality: Left;  SFA STENT X 2   Social History   Occupational History  . Not on file  Tobacco Use  . Smoking status: Former Smoker    Packs/day: 1.00    Years: 42.00    Pack years: 42.00    Types: Cigarettes    Quit date: 07/17/2017    Years since quitting: 1.8  . Smokeless tobacco: Never Used  Substance and Sexual Activity  . Alcohol use: Not Currently    Alcohol/week: 1.0 - 2.0 standard drinks    Types: 1 - 2 Glasses of wine per week    Comment: on social occassions  . Drug use: Yes    Frequency: 7.0 times per week    Types: Marijuana    Comment: daily use - last use 03/28/19  . Sexual activity: Not on file    Comment: Hysterectomy

## 2019-05-16 NOTE — Telephone Encounter (Signed)
Dorothy Spark, MD  Nuala Alpha, LPN  Heart function is now improved to normal, this is a great news, there are no significant valvular abnormalities.     The patient has been notified of the result and verbalized understanding.  All questions (if any) were answered. Nuala Alpha, LPN 2/90/9030 14:99 PM

## 2019-05-17 NOTE — Telephone Encounter (Signed)
Will send this message to our Medical Records Dept to follow-up on sending pts recent echo results, to [facility].  Lorriane Shire in Medical Records will follow-up with the pt.

## 2019-05-17 NOTE — Telephone Encounter (Signed)
Follow Up  Patient is calling in to speak with Ivy. States that she needs results sent over to [facility] (states the fax number should be in file).

## 2019-05-19 NOTE — Telephone Encounter (Signed)
Per Medical Records Rep Lorriane Shire, she faxed the pts recent echo on 5/12, to [facility].

## 2019-06-26 ENCOUNTER — Telehealth: Payer: Self-pay | Admitting: Orthopedic Surgery

## 2019-06-26 NOTE — Telephone Encounter (Signed)
Patient called.   She was given a rx for an orthopedic shoe but she misplaced the shoe and now needs a new rx faxed to [facility].   Call back: [phone]

## 2019-06-27 NOTE — Telephone Encounter (Signed)
Can you write rx?

## 2019-07-04 ENCOUNTER — Other Ambulatory Visit: Payer: Self-pay | Admitting: Cardiology

## 2019-07-07 ENCOUNTER — Other Ambulatory Visit: Payer: Self-pay

## 2019-07-07 ENCOUNTER — Ambulatory Visit (INDEPENDENT_AMBULATORY_CARE_PROVIDER_SITE_OTHER): Payer: Medicare Other | Admitting: Physician Assistant

## 2019-07-07 ENCOUNTER — Encounter: Payer: Self-pay | Admitting: Physician Assistant

## 2019-07-07 ENCOUNTER — Telehealth: Payer: Self-pay | Admitting: Radiology

## 2019-07-07 VITALS — Ht 64.0 in | Wt 160.0 lb

## 2019-07-07 DIAGNOSIS — I739 Peripheral vascular disease, unspecified: Secondary | ICD-10-CM

## 2019-07-07 NOTE — Telephone Encounter (Signed)
Patient called has hx of left transmet amp by Dr. Sharol Given 08/20/2015. States this incision has opened, has silvadene in it, no bleeding, no injury to cause opening.  States very painful, went to dialysis this morning and had to stop due to the pain in foot.  Patient is coming in this afternoon with [facility] at 1:30pm.  CB # [phone]

## 2019-07-07 NOTE — Progress Notes (Signed)
Office Visit Note   Patient: Rebekah Johnson           Date of Birth: 1960-03-24           MRN: [id number] Visit Date: 07/07/2019              Requested by: No referring provider defined for this encounter. PCP: Dallas Schimke, MD  No chief complaint on file.     HPI: The patient is a pleasant 59 year old woman who is status post left transmetatarsal amputation in 2017.  She was concerned because she was at dialysis today she will have picked off a small scab at the end of her amputation stump and it became quite painful  Assessment & Plan: Visit Diagnoses: No diagnosis found.  Plan: She may place Silvadene and a Band-Aid on this and change it daily.  She try to offload the area.  I also gave her a prescription for new extra-depth diabetic shoes she may follow-up as needed  Follow-Up Instructions: No follow-ups on file.   Ortho Exam  Patient is alert, oriented, no adenopathy, well-dressed, normal affect, normal respiratory effort. Left foot well-healed amputation stump she does have a palpable dorsalis pedis pulse.  No cellulitis no foul odor she has a small skin tear just adjacent to the incision.  No evidence of any infection no drainage  Imaging: No results found. No images are attached to the encounter.  Labs: Lab Results  Component Value Date   HGBA1C 6.9 (A) 02/02/2019   HGBA1C 5.6 11/01/2018   HGBA1C 5.5 08/31/2018   ESRSEDRATE 57 (H) 07/13/2015   CRP 0.7 07/13/2015   LABURIC 5.7 07/13/2015   REPTSTATUS 01/04/2017 FINAL 01/04/2017   CULT (A) 01/01/2017    VIRIDANS STREPTOCOCCUS THE SIGNIFICANCE OF ISOLATING THIS ORGANISM FROM A SINGLE SET OF BLOOD CULTURES WHEN MULTIPLE SETS ARE DRAWN IS UNCERTAIN. PLEASE NOTIFY THE MICROBIOLOGY DEPARTMENT WITHIN ONE WEEK IF SPECIATION AND SENSITIVITIES ARE REQUIRED. Performed at [facility], [place] [address] [place], [place] [address]      Lab Results  Component Value Date   ALBUMIN 3.6 04/27/2019   ALBUMIN  3.6 03/21/2019   ALBUMIN 4.1 03/17/2019   LABURIC 5.7 07/13/2015    Lab Results  Component Value Date   MG 1.7 01/01/2017   MG 1.7 09/16/2016   MG 1.7 09/14/2016   No results found for: VD25OH  No results found for: PREALBUMIN CBC EXTENDED Latest Ref Rng & Units 04/27/2019 03/30/2019 03/21/2019  WBC 4.0 - 10.5 K/uL 8.5 - 12.9(H)  RBC 3.87 - 5.11 MIL/uL 4.49 - 4.22  HGB 12.0 - 15.0 g/dL 12.9 13.6 12.1  HCT 36 - 46 % 41.6 40.0 38.6  PLT 150 - 400 K/uL 334 - 265  NEUTROABS 1.7 - 7.7 K/uL 5.9 - 8.9(H)  LYMPHSABS 0.7 - 4.0 K/uL 1.9 - 2.9     There is no height or weight on file to calculate BMI.  Orders:  No orders of the defined types were placed in this encounter.  No orders of the defined types were placed in this encounter.    Procedures: No procedures performed  Clinical Data: No additional findings.  ROS:  All other systems negative, except as noted in the HPI. Review of Systems  Objective: Vital Signs: LMP  (LMP Unknown)   Specialty Comments:  No specialty comments available.  PMFS History: Patient Active Problem List   Diagnosis Date Noted  . Multinodular goiter 08/31/2018  . Cardiac arrest ([place]) 11/22/2017  .  DOE (dyspnea on exertion) 09/01/2017  . COPD GOLD  0 08/31/2017  . Diabetic polyneuropathy associated with type 2 diabetes mellitus ([place]) 08/19/2017  . NSTEMI (non-ST elevated myocardial infarction) ([place]) 01/01/2017  . Chest pain 01/01/2017  . Tobacco abuse   . PVD (peripheral vascular disease) ([place])   . Proteinuria   . Obesity   . Normocytic anemia   . Hypertension   . High cholesterol   . Foot amputation status   . Diabetes mellitus with neuropathy ([place])   . CKD (chronic kidney disease), stage IV ([place])   . Chronic diastolic CHF (congestive heart failure) ([place])   . CAD (coronary artery disease) 10/27/2016  . Chronic kidney disease 09/23/2016  . Acute congestive heart failure ([place])   . Hypertensive heart disease with heart failure ([place])    . Chronic combined systolic and diastolic heart failure ([place]) 09/14/2016  . Onychomycosis 12/24/2015  . Status post transmetatarsal amputation of foot, left ([place]) 09/06/2015  . Diabetic foot ulcer ([place]) 07/13/2015  . Cellulitis 07/12/2015  . HLD (hyperlipidemia) 09/02/2012  . Peripheral nerve disease 09/02/2012  . Type 2 diabetes mellitus with peripheral angiopathy ([place]) 09/02/2012  . Hypertriglyceridemia 07/28/2012  . Peripheral vascular disease ([place]) 07/28/2012  . Avitaminosis D 07/28/2012  . Essential (primary) hypertension 06/10/2012  . History of biliary T-tube placement 06/10/2012  . Leg pain 06/10/2012  . Current tobacco use 06/10/2012  . Atrophic vaginitis 06/07/2012  . Type 2 diabetes mellitus ([place]) 06/07/2012   Past Medical History:  Diagnosis Date  . Acute congestive heart failure ([place])   . Acute diastolic CHF (congestive heart failure) ([place]) 09/14/2016  . Atrophic vaginitis 06/07/2012  . Avitaminosis D 07/28/2012  . CAD (coronary artery disease)    a. NSTEM 09/2016: cath showing severe diffuse disease of the RCA, ramus and Cx with mild-mod disease of LAD; PCI would require multiple stents and significant contrast usage thus medical therapy recommended.  . Cellulitis 07/12/2015  . Chest pain 01/01/2017  . Chronic diastolic CHF (congestive heart failure) ([place])   . Chronic kidney disease 09/23/2016  . CKD (chronic kidney disease), stage IV ([place])   . Constipation   . COPD GOLD  0 08/31/2017   Quit smoking 07/17/17 - Spirometry 09/01/2017  FEV1 1.23 (58%)  Ratio 57 with mild curvature p no rx - 09/01/2017  After extensive coaching inhaler device,  effectiveness =    75% with elipta with cough provoked  - PFT's  10/14/2017  FEV1 1.69 (68 % ) ratio 84  p no % improvement from saba p nothing prior to study with DLCO  50 % corrects to 76  % for alv volume   - 10/14/2017  Walked RA x 3 laps @ 1  . Current tobacco use 06/10/2012  . Diabetes mellitus with neuropathy ([place])   . Diabetic foot  ulcer ([place]) 07/13/2015  . Diabetic polyneuropathy associated with type 2 diabetes mellitus ([place]) 08/19/2017  . DOE (dyspnea on exertion) 09/01/2017   09/01/2017  Walked RA x 3 laps @ 185 ft each stopped due to  End of study, nl to mod fast  pace, no  desat   But stopped to rest x 2  Spirometry 09/01/2017  FEV1 1.23 (58%)  Ratio 57  > trial of anoro    . Dyspnea    with exertion  . ESRD (end stage renal disease) ([place])    MWF - [facility]  . Essential (primary) hypertension 06/10/2012  . Foot amputation status    a. h/o  left foot transmetatarsal amputation  . Headache    Migraines  . High cholesterol   . History of biliary T-tube placement 06/10/2012   Patient unaware   . History of cardiac arrest   . HLD (hyperlipidemia) 09/02/2012   Overview:  ICD-10 cut over    . Hypertension    a. patent renal arteries by PV angio 08/2015. b. heavy proteinuria 09/2016 ? nephrotic.  [name] Hypertensive heart disease with heart failure ([place])   . Hypertriglyceridemia 07/28/2012  . Leg pain 06/10/2012  . Normocytic anemia    pt denies this dx  . NSTEMI (non-ST elevated myocardial infarction) ([place]) 01/01/2017  . Obesity   . Onychomycosis 12/24/2015  . Peripheral nerve disease 09/02/2012   legs  . Peripheral vascular disease ([place]) 07/28/2012  . Pneumonia 2018  . Proteinuria   . PVD (peripheral vascular disease) ([place])    a. status post left SFA and popliteal stent and left SFA PTA with drug coated balloon in 08/2015.  . Status post transmetatarsal amputation of foot, left ([place]) 09/06/2015  . Tobacco abuse    quit cigarettes 2019, smokes marijuana  . Type 2 diabetes mellitus ([place]) 06/07/2012  . Type 2 diabetes mellitus with peripheral angiopathy ([place]) 09/02/2012    Family History  Problem Relation Age of Onset  . Diabetes Mother   . Hypertension Father     Past Surgical History:  Procedure Laterality Date  . ABDOMINAL HYSTERECTOMY    . AMPUTATION Left 09/06/2015   Procedure: LEFT TRANSMETATARSAL AMPUTATTION;   Surgeon: Newt Minion, MD;  Location: [facility];  Service: Orthopedics;  Laterality: Left;  . AV FISTULA PLACEMENT Left 12/13/2018   Procedure: ARTERIOVENOUS (AV) FISTULA CREATION LEFT UPPER ARM;  Surgeon: Waynetta Sandy, MD;  Location: [facility];  Service: Vascular;  Laterality: Left;  . BASCILIC VEIN TRANSPOSITION Left 03/30/2019   Procedure: LEFT ARM ARTERIOVENOUS VEIN TRANSPOSITION;  Surgeon: Serafina Mitchell, MD;  [facility];  Service: Vascular;  Laterality: Left;  . COLONOSCOPY     polyps x 1  . LEFT HEART CATH AND CORONARY ANGIOGRAPHY N/A 09/17/2016   Procedure: LEFT HEART CATH AND CORONARY ANGIOGRAPHY;  Surgeon: Jettie Booze, MD;  Location: [facility];  Service: Cardiovascular;  Laterality: N/A;  . PERIPHERAL VASCULAR CATHETERIZATION N/A 08/16/2015   Procedure: Abdominal Aortogram;  Surgeon: Elam Dutch, MD;  Location: [facility];  Service: Cardiovascular;  Laterality: N/A;  . PERIPHERAL VASCULAR CATHETERIZATION Bilateral 08/16/2015   Procedure: Lower Extremity Angiography;  Surgeon: Elam Dutch, MD;  Location: [facility];  Service: Cardiovascular;  Laterality: Bilateral;  . PERIPHERAL VASCULAR CATHETERIZATION Left 08/16/2015   Procedure: Peripheral Vascular Intervention;  Surgeon: Elam Dutch, MD;  Location: [facility];  Service: Cardiovascular;  Laterality: Left;  SFA STENT X 2   Social History   Occupational History  . Not on file  Tobacco Use  . Smoking status: Former Smoker    Packs/day: 1.00    Years: 42.00    Pack years: 42.00    Types: Cigarettes    Quit date: 07/17/2017    Years since quitting: 1.9  . Smokeless tobacco: Never Used  Vaping Use  . Vaping Use: Never used  Substance and Sexual Activity  . Alcohol use: Not Currently    Alcohol/week: 1.0 - 2.0 standard drink    Types: 1 - 2 Glasses of wine per week    Comment: on social occassions  . Drug use: Yes  Frequency: 7.0 times per week    Types:  Marijuana    Comment: daily use - last use 03/28/19  . Sexual activity: Not on file    Comment: Hysterectomy

## 2019-07-18 ENCOUNTER — Other Ambulatory Visit: Payer: Self-pay

## 2019-07-18 ENCOUNTER — Encounter: Payer: Self-pay | Admitting: Orthopedic Surgery

## 2019-07-18 ENCOUNTER — Encounter: Payer: Self-pay | Admitting: Gastroenterology

## 2019-07-18 ENCOUNTER — Ambulatory Visit (INDEPENDENT_AMBULATORY_CARE_PROVIDER_SITE_OTHER): Payer: Medicare Other | Admitting: Gastroenterology

## 2019-07-18 ENCOUNTER — Ambulatory Visit (INDEPENDENT_AMBULATORY_CARE_PROVIDER_SITE_OTHER): Payer: Medicare Other | Admitting: Orthopedic Surgery

## 2019-07-18 VITALS — BP 110/60 | HR 66 | Wt 170.4 lb

## 2019-07-18 VITALS — Ht 64.0 in | Wt 170.4 lb

## 2019-07-18 DIAGNOSIS — Z89432 Acquired absence of left foot: Secondary | ICD-10-CM

## 2019-07-18 DIAGNOSIS — K5900 Constipation, unspecified: Secondary | ICD-10-CM

## 2019-07-18 DIAGNOSIS — R110 Nausea: Secondary | ICD-10-CM | POA: Diagnosis not present

## 2019-07-18 DIAGNOSIS — L02612 Cutaneous abscess of left foot: Secondary | ICD-10-CM | POA: Diagnosis not present

## 2019-07-18 DIAGNOSIS — B351 Tinea unguium: Secondary | ICD-10-CM | POA: Diagnosis not present

## 2019-07-18 MED ORDER — DOXYCYCLINE HYCLATE 100 MG PO TABS: 100.0000 mg | ORAL_TABLET | Freq: Every day | ORAL | 0 refills | Status: DC

## 2019-07-18 NOTE — Progress Notes (Signed)
Office Visit Note   Patient: Rebekah Johnson           Date of Birth: 1960-05-14           MRN: [id number] Visit Date: 07/18/2019              Requested by: No referring provider defined for this encounter. PCP: Dallas Schimke, MD  Chief Complaint  Patient presents with  . Left Foot - Follow-up      HPI: Patient is a 59 year old woman who presents complaining of onychomycotic nails x5 for the right foot as well as a painful callus on her left transmetatarsal amputation.  She states that she has been having increased pain for the last several days.  Patient states she has been having increasing swelling in the left foot as well.  Patient is on dialysis 3 times a week.  Assessment & Plan: Visit Diagnoses:  1. Status post transmetatarsal amputation of foot, left ([place])   2. Onychomycosis   3. Cutaneous abscess of left foot     Plan: Nails were trimmed x5 the abscess was decompressed and patient started on doxycycline 100 mg daily.  Reevaluate next week for possible further debridement and possible adjustment of her antibiotics.  Follow-Up Instructions: Return in about 1 week (around 07/25/2019).   Ortho Exam  Patient is alert, oriented, no adenopathy, well-dressed, normal affect, normal respiratory effort. Examination patient does have a tight Achilles on the left she has dorsiflexion of the ankle to neutral she has an extremely painful callus over the medial column of the left foot there is no redness no cellulitis no drainage clinically there is no signs of infection other than the significant pain over the callus.  After informed consent a 10 blade knife was used to debride skin and soft tissue there is a large deep purulent abscess this was decompressed and cleansed.  After debridement the wound was 20 mm in diameter and 5 mm deep this did not probe to bone or tendon.  Iodosorb 4 x 4 and Ace wrap is applied.  Examination right foot she has thickened discolored onychomycotic  nails x5 she is unable to safely trim the nails on her own and the nails were trimmed x5 without complication.  Imaging: No results found. No images are attached to the encounter.  Labs: Lab Results  Component Value Date   HGBA1C 6.9 (A) 02/02/2019   HGBA1C 5.6 11/01/2018   HGBA1C 5.5 08/31/2018   ESRSEDRATE 57 (H) 07/13/2015   CRP 0.7 07/13/2015   LABURIC 5.7 07/13/2015   REPTSTATUS 01/04/2017 FINAL 01/04/2017   CULT (A) 01/01/2017    VIRIDANS STREPTOCOCCUS THE SIGNIFICANCE OF ISOLATING THIS ORGANISM FROM A SINGLE SET OF BLOOD CULTURES WHEN MULTIPLE SETS ARE DRAWN IS UNCERTAIN. PLEASE NOTIFY THE MICROBIOLOGY DEPARTMENT WITHIN ONE WEEK IF SPECIATION AND SENSITIVITIES ARE REQUIRED. Performed at [facility], [place] [address], [place], [place] [address]      Lab Results  Component Value Date   ALBUMIN 3.6 04/27/2019   ALBUMIN 3.6 03/21/2019   ALBUMIN 4.1 03/17/2019   LABURIC 5.7 07/13/2015    Lab Results  Component Value Date   MG 1.7 01/01/2017   MG 1.7 09/16/2016   MG 1.7 09/14/2016   No results found for: VD25OH  No results found for: PREALBUMIN CBC EXTENDED Latest Ref Rng & Units 04/27/2019 03/30/2019 03/21/2019  WBC 4.0 - 10.5 K/uL 8.5 - 12.9(H)  RBC 3.87 - 5.11 MIL/uL 4.49 - 4.22  HGB 12.0 - 15.0 g/dL 12.9 13.6 12.1  HCT 36 - 46 % 41.6 40.0 38.6  PLT 150 - 400 K/uL 334 - 265  NEUTROABS 1.7 - 7.7 K/uL 5.9 - 8.9(H)  LYMPHSABS 0.7 - 4.0 K/uL 1.9 - 2.9     Body mass index is 29.25 kg/m.  Orders:  No orders of the defined types were placed in this encounter.  No orders of the defined types were placed in this encounter.    Procedures: No procedures performed  Clinical Data: No additional findings.  ROS:  All other systems negative, except as noted in the HPI. Review of Systems  Objective: Vital Signs: Ht 5\' 4"  (1.626 m)   Wt 170 lb 6.1 oz (77.3 kg)   LMP  (LMP Unknown)   BMI 29.25 kg/m   Specialty Comments:  No specialty comments  available.  PMFS History: Patient Active Problem List   Diagnosis Date Noted  . Multinodular goiter 08/31/2018  . Cardiac arrest ([place]) 11/22/2017  . DOE (dyspnea on exertion) 09/01/2017  . COPD GOLD  0 08/31/2017  . Diabetic polyneuropathy associated with type 2 diabetes mellitus ([place]) 08/19/2017  . NSTEMI (non-ST elevated myocardial infarction) ([place]) 01/01/2017  . Chest pain 01/01/2017  . Tobacco abuse   . PVD (peripheral vascular disease) ([place])   . Proteinuria   . Obesity   . Normocytic anemia   . Hypertension   . High cholesterol   . Foot amputation status   . Diabetes mellitus with neuropathy ([place])   . CKD (chronic kidney disease), stage IV ([place])   . Chronic diastolic CHF (congestive heart failure) ([place])   . CAD (coronary artery disease) 10/27/2016  . Chronic kidney disease 09/23/2016  . Acute congestive heart failure ([place])   . Hypertensive heart disease with heart failure ([place])   . Chronic combined systolic and diastolic heart failure ([place]) 09/14/2016  . Onychomycosis 12/24/2015  . Status post transmetatarsal amputation of foot, left ([place]) 09/06/2015  . Diabetic foot ulcer ([place]) 07/13/2015  . Cellulitis 07/12/2015  . HLD (hyperlipidemia) 09/02/2012  . Peripheral nerve disease 09/02/2012  . Type 2 diabetes mellitus with peripheral angiopathy ([place]) 09/02/2012  . Hypertriglyceridemia 07/28/2012  . Peripheral vascular disease ([place]) 07/28/2012  . Avitaminosis D 07/28/2012  . Essential (primary) hypertension 06/10/2012  . History of biliary T-tube placement 06/10/2012  . Leg pain 06/10/2012  . Current tobacco use 06/10/2012  . Atrophic vaginitis 06/07/2012  . Type 2 diabetes mellitus ([place]) 06/07/2012   Past Medical History:  Diagnosis Date  . Acute congestive heart failure ([place])   . Acute diastolic CHF (congestive heart failure) ([place]) 09/14/2016  . Atrophic vaginitis 06/07/2012  . Avitaminosis D 07/28/2012  . CAD (coronary artery disease)    a. NSTEM 09/2016: cath  showing severe diffuse disease of the RCA, ramus and Cx with mild-mod disease of LAD; PCI would require multiple stents and significant contrast usage thus medical therapy recommended.  . Cellulitis 07/12/2015  . Chest pain 01/01/2017  . Chronic diastolic CHF (congestive heart failure) ([place])   . Chronic kidney disease 09/23/2016  . CKD (chronic kidney disease), stage IV ([place])   . Constipation   . COPD GOLD  0 08/31/2017   Quit smoking 07/17/17 - Spirometry 09/01/2017  FEV1 1.23 (58%)  Ratio 57 with mild curvature p no rx - 09/01/2017  After extensive coaching inhaler device,  effectiveness =    75% with elipta with cough provoked  - PFT's  10/14/2017  FEV1 1.69 (68 % )  ratio 84  p no % improvement from saba p nothing prior to study with DLCO  50 % corrects to 76  % for alv volume   - 10/14/2017  Walked RA x 3 laps @ 1  . Current tobacco use 06/10/2012  . Diabetes mellitus with neuropathy ([place])   . Diabetic foot ulcer ([place]) 07/13/2015  . Diabetic polyneuropathy associated with type 2 diabetes mellitus ([place]) 08/19/2017  . DOE (dyspnea on exertion) 09/01/2017   09/01/2017  Walked RA x 3 laps @ 185 ft each stopped due to  End of study, nl to mod fast  pace, no  desat   But stopped to rest x 2  Spirometry 09/01/2017  FEV1 1.23 (58%)  Ratio 57  > trial of anoro    . Dyspnea    with exertion  . ESRD (end stage renal disease) ([place])    MWF - [facility]  . Essential (primary) hypertension 06/10/2012  . Foot amputation status    a. h/o left foot transmetatarsal amputation  . Headache    Migraines  . High cholesterol   . History of biliary T-tube placement 06/10/2012   Patient unaware   . History of cardiac arrest   . HLD (hyperlipidemia) 09/02/2012   Overview:  ICD-10 cut over    . Hypertension    a. patent renal arteries by PV angio 08/2015. b. heavy proteinuria 09/2016 ? nephrotic.  [name] Hypertensive heart disease with heart failure ([place])   . Hypertriglyceridemia 07/28/2012  . Leg pain 06/10/2012  . Normocytic  anemia    pt denies this dx  . NSTEMI (non-ST elevated myocardial infarction) ([place]) 01/01/2017  . Obesity   . Onychomycosis 12/24/2015  . Peripheral nerve disease 09/02/2012   legs  . Peripheral vascular disease ([place]) 07/28/2012  . Pneumonia 2018  . Proteinuria   . PVD (peripheral vascular disease) ([place])    a. status post left SFA and popliteal stent and left SFA PTA with drug coated balloon in 08/2015.  . Status post transmetatarsal amputation of foot, left ([place]) 09/06/2015  . Tobacco abuse    quit cigarettes 2019, smokes marijuana  . Type 2 diabetes mellitus ([place]) 06/07/2012  . Type 2 diabetes mellitus with peripheral angiopathy ([place]) 09/02/2012    Family History  Problem Relation Age of Onset  . Diabetes Mother   . Hypertension Father   . Colon cancer Neg Hx   . Stomach cancer Neg Hx   . Pancreatic cancer Neg Hx     Past Surgical History:  Procedure Laterality Date  . ABDOMINAL HYSTERECTOMY    . AMPUTATION Left 09/06/2015   Procedure: LEFT TRANSMETATARSAL AMPUTATTION;  Surgeon: Newt Minion, MD;  Location: [facility];  Service: Orthopedics;  Laterality: Left;  . AV FISTULA PLACEMENT Left 12/13/2018   Procedure: ARTERIOVENOUS (AV) FISTULA CREATION LEFT UPPER ARM;  Surgeon: Waynetta Sandy, MD;  Location: [facility];  Service: Vascular;  Laterality: Left;  . BASCILIC VEIN TRANSPOSITION Left 03/30/2019   Procedure: LEFT ARM ARTERIOVENOUS VEIN TRANSPOSITION;  Surgeon: Serafina Mitchell, MD;  [facility];  Service: Vascular;  Laterality: Left;  . COLONOSCOPY     polyps x 1  . LEFT HEART CATH AND CORONARY ANGIOGRAPHY N/A 09/17/2016   Procedure: LEFT HEART CATH AND CORONARY ANGIOGRAPHY;  Surgeon: Jettie Booze, MD;  Location: [facility];  Service: Cardiovascular;  Laterality: N/A;  . PERIPHERAL VASCULAR CATHETERIZATION N/A 08/16/2015   Procedure: Abdominal Aortogram;  Surgeon: Elam Dutch, MD;  Location:  [facility];  Service: Cardiovascular;  Laterality: N/A;  .  PERIPHERAL VASCULAR CATHETERIZATION Bilateral 08/16/2015   Procedure: Lower Extremity Angiography;  Surgeon: Elam Dutch, MD;  Location: [facility];  Service: Cardiovascular;  Laterality: Bilateral;  . PERIPHERAL VASCULAR CATHETERIZATION Left 08/16/2015   Procedure: Peripheral Vascular Intervention;  Surgeon: Elam Dutch, MD;  Location: [facility];  Service: Cardiovascular;  Laterality: Left;  SFA STENT X 2   Social History   Occupational History  . Not on file  Tobacco Use  . Smoking status: Former Smoker    Packs/day: 1.00    Years: 42.00    Pack years: 42.00    Types: Cigarettes    Quit date: 07/17/2017    Years since quitting: 2.0  . Smokeless tobacco: Never Used  Vaping Use  . Vaping Use: Never used  Substance and Sexual Activity  . Alcohol use: Not Currently    Alcohol/week: 1.0 - 2.0 standard drink    Types: 1 - 2 Glasses of wine per week    Comment: on social occassions  . Drug use: Yes    Frequency: 7.0 times per week    Types: Marijuana    Comment: daily use - last use 03/28/19  . Sexual activity: Not on file    Comment: Hysterectomy

## 2019-07-18 NOTE — Patient Instructions (Signed)
If Colace regimen stops working then need to start Miralax 1 capful 1-2 times daily in 8 ounces of liquid.   If nausea becomes more frequent and/or starting having more reflux then consider adding medicines such as Prilosec, Protonix, etc.  If you are age 61 or older, your body mass index should be between 23-30. Your Body mass index is 29.24 kg/m. If this is out of the aforementioned range listed, please consider follow up with your Primary Care Provider.  If you are age 71 or younger, your body mass index should be between 19-25. Your Body mass index is 29.24 kg/m. If this is out of the aformentioned range listed, please consider follow up with your Primary Care Provider.

## 2019-07-18 NOTE — Progress Notes (Signed)
07/18/2019 Rebekah Johnson [id number] [birth year]   HISTORY OF PRESENT ILLNESS: This is a 59 year old female with multiple medical problems listed below who is here today as a referral for complaints of nausea.  Saw Dr. Ardis Hughs once in 2019.  She tells me that about 3 to 7 days she will experience some nausea, but she takes Zofran and that seems to help.  She does not really seem concerned like it is a problem.  She denies much in the way of heartburn/acid reflux.  She also reported having constipation.  She says that she increased her Colace and is now taking 300 mg twice daily.  She has been doing that for the past couple weeks and says that that seems to be working well, moving her bowels every other day.  She denies seeing blood in her stools.  Otherwise she is not currently using any lactulose or MiraLAX which are both on her medication list.  She had a colonoscopy in 07/2018 with Dr. Paulita Fujita at [facility].  Her preparation was only fair, but she was only found to have diverticulosis and internal hemorrhoids at that time.   Past Medical History:  Diagnosis Date   Acute congestive heart failure (HCC)    Acute diastolic CHF (congestive heart failure) ([place]) 09/14/2016   Atrophic vaginitis 06/07/2012   Avitaminosis D 07/28/2012   CAD (coronary artery disease)    a. NSTEM 09/2016: cath showing severe diffuse disease of the RCA, ramus and Cx with mild-mod disease of LAD; PCI would require multiple stents and significant contrast usage thus medical therapy recommended.   Cellulitis 07/12/2015   Chest pain 01/01/2017   Chronic diastolic CHF (congestive heart failure) (HCC)    Chronic kidney disease 09/23/2016   CKD (chronic kidney disease), stage IV (HCC)    Constipation    COPD GOLD  0 08/31/2017   Quit smoking 07/17/17 - Spirometry 09/01/2017  FEV1 1.23 (58%)  Ratio 57 with mild curvature p no rx - 09/01/2017  After extensive coaching inhaler device,  effectiveness =    75% with elipta  with cough provoked  - PFT's  10/14/2017  FEV1 1.69 (68 % ) ratio 84  p no % improvement from saba p nothing prior to study with DLCO  50 % corrects to 76  % for alv volume   - 10/14/2017  Walked RA x 3 laps @ 1   Current tobacco use 06/10/2012   Diabetes mellitus with neuropathy ([place])    Diabetic foot ulcer ([place]) 07/13/2015   Diabetic polyneuropathy associated with type 2 diabetes mellitus ([place]) 08/19/2017   DOE (dyspnea on exertion) 09/01/2017   09/01/2017  Walked RA x 3 laps @ 185 ft each stopped due to  End of study, nl to mod fast  pace, no  desat   But stopped to rest x 2  Spirometry 09/01/2017  FEV1 1.23 (58%)  Ratio 57  > trial of anoro     Dyspnea    with exertion   ESRD (end stage renal disease) ([place])    MWF - [place] (primary) hypertension 06/10/2012   Foot amputation status    a. h/o left foot transmetatarsal amputation   Headache    Migraines   High cholesterol    History of biliary T-tube placement 06/10/2012   Patient unaware    History of cardiac arrest    HLD (hyperlipidemia) 09/02/2012   Overview:  ICD-10 cut over     Hypertension  a. patent renal arteries by PV angio 08/2015. b. heavy proteinuria 09/2016 ? nephrotic.   Hypertensive heart disease with heart failure ([place])    Hypertriglyceridemia 07/28/2012   Leg pain 06/10/2012   Normocytic anemia    pt denies this dx   NSTEMI (non-ST elevated myocardial infarction) ([place]) 01/01/2017   Obesity    Onychomycosis 12/24/2015   Peripheral nerve disease 09/02/2012   legs   Peripheral vascular disease ([place]) 07/28/2012   Pneumonia 2018   Proteinuria    PVD (peripheral vascular disease) ([place])    a. status post left SFA and popliteal stent and left SFA PTA with drug coated balloon in 08/2015.   Status post transmetatarsal amputation of foot, left ([place]) 09/06/2015   Tobacco abuse    quit cigarettes 2019, smokes marijuana   Type 2 diabetes mellitus ([place]) 06/07/2012   Type 2 diabetes mellitus  with peripheral angiopathy ([place]) 09/02/2012   Past Surgical History:  Procedure Laterality Date   ABDOMINAL HYSTERECTOMY     AMPUTATION Left 09/06/2015   Procedure: LEFT TRANSMETATARSAL AMPUTATTION;  Surgeon: Newt Minion, MD;  Location: [facility];  Service: Orthopedics;  Laterality: Left;   AV FISTULA PLACEMENT Left 12/13/2018   Procedure: ARTERIOVENOUS (AV) FISTULA CREATION LEFT UPPER ARM;  Surgeon: Waynetta Sandy, MD;  Location: [facility];  Service: Vascular;  Laterality: Left;   [place] Left 03/30/2019   Procedure: LEFT ARM ARTERIOVENOUS VEIN TRANSPOSITION;  Surgeon: Serafina Mitchell, MD;  [facility];  Service: Vascular;  Laterality: Left;   COLONOSCOPY     polyps x 1   LEFT HEART CATH AND CORONARY ANGIOGRAPHY N/A 09/17/2016   Procedure: LEFT HEART CATH AND CORONARY ANGIOGRAPHY;  Surgeon: Jettie Booze, MD;  Location: [facility];  Service: Cardiovascular;  Laterality: N/A;   PERIPHERAL VASCULAR CATHETERIZATION N/A 08/16/2015   Procedure: Abdominal Aortogram;  Surgeon: Elam Dutch, MD;  Location: [facility];  Service: Cardiovascular;  Laterality: N/A;   PERIPHERAL VASCULAR CATHETERIZATION Bilateral 08/16/2015   Procedure: Lower Extremity Angiography;  Surgeon: Elam Dutch, MD;  Location: [facility];  Service: Cardiovascular;  Laterality: Bilateral;   PERIPHERAL VASCULAR CATHETERIZATION Left 08/16/2015   Procedure: Peripheral Vascular Intervention;  Surgeon: Elam Dutch, MD;  Location: [facility];  Service: Cardiovascular;  Laterality: Left;  SFA STENT X 2    reports that she quit smoking about 2 years ago. Her smoking use included cigarettes. She has a 42.00 pack-year smoking history. She has never used smokeless tobacco. She reports previous alcohol use of about 1.0 - 2.0 standard drink of alcohol per week. She reports current drug use. Frequency: 7.00 times per week. Drug: Marijuana. family history includes  Diabetes in her mother; Hypertension in her father. No Known Allergies    Outpatient Encounter Medications as of 07/18/2019  Medication Sig   acetaminophen (TYLENOL) 500 MG tablet Take 1,000 mg by mouth every 6 (six) hours as needed for moderate pain or headache.   amLODipine (NORVASC) 5 MG tablet Take 5 mg by mouth daily.   Ascorbic Acid (VITAMIN C) 1000 MG tablet Take 1,000 mg by mouth daily.   aspirin 81 MG EC tablet Take 1 tablet (81 mg total) by mouth daily. Reported on 07/13/2015   atorvastatin (LIPITOR) 80 MG tablet Take 1 tablet (80 mg total) by mouth daily.   Blood Glucose Monitoring Suppl (ACCU-CHEK AVIVA) device Use as instructed three times daily. (Patient taking differently: Use as instructed twice times  daily.)   carvedilol (COREG) 25 MG tablet Take 1 tablet (25 mg total) by mouth 2 (two) times daily.   cholecalciferol (VITAMIN D3) 25 MCG (1000 UT) tablet Take 1,000 Units by mouth daily.   clopidogrel (PLAVIX) 75 MG tablet Take 1 tablet (75 mg total) by mouth daily.   docusate sodium (COLACE) 100 MG capsule Take 200 mg by mouth 2 (two) times daily.    furosemide (LASIX) 80 MG tablet Take 80 mg by mouth 2 (two) times daily.   gabapentin (NEURONTIN) 300 MG capsule Take 1 capsule (300 mg total) by mouth 3 (three) times daily.   glipiZIDE (GLUCOTROL) 5 MG tablet Take 0.5 tablets (2.5 mg total) by mouth daily before breakfast. (Patient taking differently: Take 5 mg by mouth daily before breakfast. )   glucose blood (ACCU-CHEK AVIVA) test strip Use as instructed three times daily before meals.   hydrALAZINE (APRESOLINE) 50 MG tablet Take 50 mg by mouth 3 (three) times daily.   isosorbide mononitrate (IMDUR) 30 MG 24 hr tablet Take 60 mg by mouth daily.   lactulose (CHRONULAC) 10 GM/15ML solution Take 15 mLs (10 g total) by mouth 3 (three) times daily.   Lancet Devices (ACCU-CHEK SOFTCLIX) lancets Use as instructed three times daily before meals.   latanoprost (XALATAN)  0.005 % ophthalmic solution Place 1 drop into both eyes at bedtime.    Omega-3 Fatty Acids (FISH OIL) 1000 MG CAPS Take 1,000 mg by mouth daily.    ranolazine (RANEXA) 500 MG 12 hr tablet Take 1 tablet by mouth twice daily   sevelamer carbonate (RENVELA) 800 MG tablet Take 800 mg by mouth See admin instructions. Three times daily with meals and with snack.   silver sulfADIAZINE (SILVADENE) 1 % cream Apply 1 application topically daily. Apply to affected area daily plus dry dressing (Patient taking differently: Apply 1 application topically daily as needed (wound care). )   sitaGLIPtin (JANUVIA) 25 MG tablet Take 1 tablet (25 mg total) by mouth daily.   ZOFRAN 4 MG tablet Take 4 mg by mouth every 8 (eight) hours as needed for nausea/vomiting.   nitroGLYCERIN (NITROSTAT) 0.4 MG SL tablet Place 1 tablet (0.4 mg total) under the tongue every 5 (five) minutes as needed for chest pain.   [DISCONTINUED] bisacodyl (DULCOLAX) 10 MG suppository Place 1 suppository (10 mg total) rectally as needed for moderate constipation.   [DISCONTINUED] oxyCODONE-acetaminophen (PERCOCET/ROXICET) 5-325 MG tablet Take 1 tablet by mouth every 6 (six) hours as needed.   No facility-administered encounter medications on file as of 07/18/2019.    REVIEW OF SYSTEMS  : All other systems reviewed and negative except where noted in the History of Present Illness.   PHYSICAL EXAM: BP 110/60    Pulse 66    Wt 170 lb 6 oz (77.3 kg)    LMP  (LMP Unknown)    BMI 29.24 kg/m  General: Well developed AA female in no acute distress Head: Normocephalic and atraumatic Eyes:  Sclerae anicteric, conjunctiva pink. Ears: Normal auditory acuity  Lungs: Clear throughout to auscultation; no increased WOB Heart: Regular rate and rhythm; no M/R/G. Abdomen: Soft, non-distended.  BS present.  Non-tender. Musculoskeletal: Symmetrical with no gross deformities  Skin: No lesions on visible extremities Extremities: No edema    Neurological: Alert oriented x 4, grossly non-focal Psychological:  Alert and cooperative. Normal mood and affect  ASSESSMENT AND PLAN: *Constipation:  She tells me that her constipation is currently doing well with colace 300 mg BID and is  moving her bowels well every other day.  I recommended that if the constipation becomes an issue again then she should begin MiraLAX 1 capful mixed in 8 ounces of liquid once or twice daily. *Nausea:  Says that the nausea is much improved.  Uses occasional zofran, which helps.  Denies much heartburn/reflux.  Not interested in any type of procedures at this time.  We discussed that sometimes nausea can be related to some underlying acid reflux or heartburn related issues.  If the nausea becomes more frequent or she starts experiencing more heartburn or reflux then may consider adding medication such as Prilosec or Protonix, etc. *ESRD:  On HD MWF. *Multiple medical problems as above   CC:  Corliss Parish, MD

## 2019-07-25 ENCOUNTER — Other Ambulatory Visit: Payer: Self-pay

## 2019-07-25 ENCOUNTER — Ambulatory Visit (INDEPENDENT_AMBULATORY_CARE_PROVIDER_SITE_OTHER): Payer: Medicare Other | Admitting: Physician Assistant

## 2019-07-25 ENCOUNTER — Encounter: Payer: Self-pay | Admitting: Physician Assistant

## 2019-07-25 VITALS — Ht 64.0 in | Wt 170.4 lb

## 2019-07-25 DIAGNOSIS — E1151 Type 2 diabetes mellitus with diabetic peripheral angiopathy without gangrene: Secondary | ICD-10-CM

## 2019-07-25 NOTE — Progress Notes (Signed)
Office Visit Note   Patient: Rebekah Johnson           Date of Birth: 1960/11/13           MRN: [id number] Visit Date: 07/25/2019              Requested by: No referring provider defined for this encounter. PCP: Dallas Schimke, MD  Chief Complaint  Patient presents with  . Left Foot - Follow-up      HPI: This is a pleasant woman who presents for recheck of her left foot.  She was status post left transmet amputation.  She presented last week with increasing pain and a callus on the end of her amputation stump.  This was debrided and some purulent fluid was expressed.  She has been limiting her weightbearing.  She also has been doing daily dressing changes and is currently on doxycycline 1 a day.  She feels much better and states the foot is much less tender  Assessment & Plan: Visit Diagnoses: No diagnosis found.  Plan: Patient significantly improved.  She will finish her course of antibiotics follow-up in 2 weeks or sooner if she has any increasing symptoms  Follow-Up Instructions: No follow-ups on file.   Ortho Exam  Patient is alert, oriented, no adenopathy, well-dressed, normal affect, normal respiratory effort. Focused examination of her left foot.  She has a small area where the callus was debrided.  There is no surrounding cellulitis.  I could not express any purulent drainage.  She is nontender to palpation.  No foul odor.  No evidence of any further infection.  Imaging: No results found. No images are attached to the encounter.  Labs: Lab Results  Component Value Date   HGBA1C 6.9 (A) 02/02/2019   HGBA1C 5.6 11/01/2018   HGBA1C 5.5 08/31/2018   ESRSEDRATE 57 (H) 07/13/2015   CRP 0.7 07/13/2015   LABURIC 5.7 07/13/2015   REPTSTATUS 01/04/2017 FINAL 01/04/2017   CULT (A) 01/01/2017    VIRIDANS STREPTOCOCCUS THE SIGNIFICANCE OF ISOLATING THIS ORGANISM FROM A SINGLE SET OF BLOOD CULTURES WHEN MULTIPLE SETS ARE DRAWN IS UNCERTAIN. PLEASE NOTIFY THE  MICROBIOLOGY DEPARTMENT WITHIN ONE WEEK IF SPECIATION AND SENSITIVITIES ARE REQUIRED. Performed at [facility], [place] [address], [place], [place] [address]      Lab Results  Component Value Date   ALBUMIN 3.6 04/27/2019   ALBUMIN 3.6 03/21/2019   ALBUMIN 4.1 03/17/2019   LABURIC 5.7 07/13/2015    Lab Results  Component Value Date   MG 1.7 01/01/2017   MG 1.7 09/16/2016   MG 1.7 09/14/2016   No results found for: VD25OH  No results found for: PREALBUMIN CBC EXTENDED Latest Ref Rng & Units 04/27/2019 03/30/2019 03/21/2019  WBC 4.0 - 10.5 K/uL 8.5 - 12.9(H)  RBC 3.87 - 5.11 MIL/uL 4.49 - 4.22  HGB 12.0 - 15.0 g/dL 12.9 13.6 12.1  HCT 36 - 46 % 41.6 40.0 38.6  PLT 150 - 400 K/uL 334 - 265  NEUTROABS 1.7 - 7.7 K/uL 5.9 - 8.9(H)  LYMPHSABS 0.7 - 4.0 K/uL 1.9 - 2.9     Body mass index is 29.25 kg/m.  Orders:  No orders of the defined types were placed in this encounter.  No orders of the defined types were placed in this encounter.    Procedures: No procedures performed  Clinical Data: No additional findings.  ROS:  All other systems negative, except as noted in the HPI. Review of Systems  Objective: Vital Signs: Ht 5\' 4"  (1.626 m)   Wt 170 lb 6.1 oz (77.3 kg)   LMP  (LMP Unknown)   BMI 29.25 kg/m   Specialty Comments:  No specialty comments available.  PMFS History: Patient Active Problem List   Diagnosis Date Noted  . Multinodular goiter 08/31/2018  . Cardiac arrest ([place]) 11/22/2017  . DOE (dyspnea on exertion) 09/01/2017  . COPD GOLD  0 08/31/2017  . Diabetic polyneuropathy associated with type 2 diabetes mellitus ([place]) 08/19/2017  . NSTEMI (non-ST elevated myocardial infarction) ([place]) 01/01/2017  . Chest pain 01/01/2017  . Tobacco abuse   . PVD (peripheral vascular disease) ([place])   . Proteinuria   . Obesity   . Normocytic anemia   . Hypertension   . High cholesterol   . Foot amputation status   . Diabetes mellitus with neuropathy ([place])    . CKD (chronic kidney disease), stage IV ([place])   . Chronic diastolic CHF (congestive heart failure) ([place])   . CAD (coronary artery disease) 10/27/2016  . Chronic kidney disease 09/23/2016  . Acute congestive heart failure ([place])   . Hypertensive heart disease with heart failure ([place])   . Chronic combined systolic and diastolic heart failure ([place]) 09/14/2016  . Onychomycosis 12/24/2015  . Status post transmetatarsal amputation of foot, left ([place]) 09/06/2015  . Diabetic foot ulcer ([place]) 07/13/2015  . Cellulitis 07/12/2015  . HLD (hyperlipidemia) 09/02/2012  . Peripheral nerve disease 09/02/2012  . Type 2 diabetes mellitus with peripheral angiopathy ([place]) 09/02/2012  . Hypertriglyceridemia 07/28/2012  . Peripheral vascular disease ([place]) 07/28/2012  . Avitaminosis D 07/28/2012  . Essential (primary) hypertension 06/10/2012  . History of biliary T-tube placement 06/10/2012  . Leg pain 06/10/2012  . Current tobacco use 06/10/2012  . Atrophic vaginitis 06/07/2012  . Type 2 diabetes mellitus ([place]) 06/07/2012   Past Medical History:  Diagnosis Date  . Acute congestive heart failure ([place])   . Acute diastolic CHF (congestive heart failure) ([place]) 09/14/2016  . Atrophic vaginitis 06/07/2012  . Avitaminosis D 07/28/2012  . CAD (coronary artery disease)    a. NSTEM 09/2016: cath showing severe diffuse disease of the RCA, ramus and Cx with mild-mod disease of LAD; PCI would require multiple stents and significant contrast usage thus medical therapy recommended.  . Cellulitis 07/12/2015  . Chest pain 01/01/2017  . Chronic diastolic CHF (congestive heart failure) ([place])   . Chronic kidney disease 09/23/2016  . CKD (chronic kidney disease), stage IV ([place])   . Constipation   . COPD GOLD  0 08/31/2017   Quit smoking 07/17/17 - Spirometry 09/01/2017  FEV1 1.23 (58%)  Ratio 57 with mild curvature p no rx - 09/01/2017  After extensive coaching inhaler device,  effectiveness =    75% with elipta with cough  provoked  - PFT's  10/14/2017  FEV1 1.69 (68 % ) ratio 84  p no % improvement from saba p nothing prior to study with DLCO  50 % corrects to 76  % for alv volume   - 10/14/2017  Walked RA x 3 laps @ 1  . Current tobacco use 06/10/2012  . Diabetes mellitus with neuropathy ([place])   . Diabetic foot ulcer ([place]) 07/13/2015  . Diabetic polyneuropathy associated with type 2 diabetes mellitus ([place]) 08/19/2017  . DOE (dyspnea on exertion) 09/01/2017   09/01/2017  Walked RA x 3 laps @ 185 ft each stopped due to  End of study, nl to mod fast  pace, no  desat  But stopped to rest x 2  Spirometry 09/01/2017  FEV1 1.23 (58%)  Ratio 57  > trial of anoro    . Dyspnea    with exertion  . ESRD (end stage renal disease) ([place])    MWF - [facility]  . Essential (primary) hypertension 06/10/2012  . Foot amputation status    a. h/o left foot transmetatarsal amputation  . Headache    Migraines  . High cholesterol   . History of biliary T-tube placement 06/10/2012   Patient unaware   . History of cardiac arrest   . HLD (hyperlipidemia) 09/02/2012   Overview:  ICD-10 cut over    . Hypertension    a. patent renal arteries by PV angio 08/2015. b. heavy proteinuria 09/2016 ? nephrotic.  [name] Hypertensive heart disease with heart failure ([place])   . Hypertriglyceridemia 07/28/2012  . Leg pain 06/10/2012  . Normocytic anemia    pt denies this dx  . NSTEMI (non-ST elevated myocardial infarction) ([place]) 01/01/2017  . Obesity   . Onychomycosis 12/24/2015  . Peripheral nerve disease 09/02/2012   legs  . Peripheral vascular disease ([place]) 07/28/2012  . Pneumonia 2018  . Proteinuria   . PVD (peripheral vascular disease) ([place])    a. status post left SFA and popliteal stent and left SFA PTA with drug coated balloon in 08/2015.  . Status post transmetatarsal amputation of foot, left ([place]) 09/06/2015  . Tobacco abuse    quit cigarettes 2019, smokes marijuana  . Type 2 diabetes mellitus ([place]) 06/07/2012  . Type 2 diabetes mellitus with  peripheral angiopathy ([place]) 09/02/2012    Family History  Problem Relation Age of Onset  . Diabetes Mother   . Hypertension Father   . Colon cancer Neg Hx   . Stomach cancer Neg Hx   . Pancreatic cancer Neg Hx     Past Surgical History:  Procedure Laterality Date  . ABDOMINAL HYSTERECTOMY    . AMPUTATION Left 09/06/2015   Procedure: LEFT TRANSMETATARSAL AMPUTATTION;  Surgeon: Newt Minion, MD;  Location: [facility];  Service: Orthopedics;  Laterality: Left;  . AV FISTULA PLACEMENT Left 12/13/2018   Procedure: ARTERIOVENOUS (AV) FISTULA CREATION LEFT UPPER ARM;  Surgeon: Waynetta Sandy, MD;  Location: [facility] [facility];  Service: Vascular;  Laterality: Left;  . BASCILIC VEIN TRANSPOSITION Left 03/30/2019   Procedure: LEFT ARM ARTERIOVENOUS VEIN TRANSPOSITION;  Surgeon: Serafina Mitchell, MD;  [facility];  Service: Vascular;  Laterality: Left;  . COLONOSCOPY     polyps x 1  . LEFT HEART CATH AND CORONARY ANGIOGRAPHY N/A 09/17/2016   Procedure: LEFT HEART CATH AND CORONARY ANGIOGRAPHY;  Surgeon: Jettie Booze, MD;  Location: [facility];  Service: Cardiovascular;  Laterality: N/A;  . PERIPHERAL VASCULAR CATHETERIZATION N/A 08/16/2015   Procedure: Abdominal Aortogram;  Surgeon: Elam Dutch, MD;  Location: [facility];  Service: Cardiovascular;  Laterality: N/A;  . PERIPHERAL VASCULAR CATHETERIZATION Bilateral 08/16/2015   Procedure: Lower Extremity Angiography;  Surgeon: Elam Dutch, MD;  Location: [facility];  Service: Cardiovascular;  Laterality: Bilateral;  . PERIPHERAL VASCULAR CATHETERIZATION Left 08/16/2015   Procedure: Peripheral Vascular Intervention;  Surgeon: Elam Dutch, MD;  Location: [facility];  Service: Cardiovascular;  Laterality: Left;  SFA STENT X 2   Social History   Occupational History  . Not on file  Tobacco Use  . Smoking status: Former Smoker    Packs/day: 1.00    Years: 42.00  Pack years: 42.00    Types:  Cigarettes    Quit date: 07/17/2017    Years since quitting: 2.0  . Smokeless tobacco: Never Used  Vaping Use  . Vaping Use: Never used  Substance and Sexual Activity  . Alcohol use: Not Currently    Alcohol/week: 1.0 - 2.0 standard drink    Types: 1 - 2 Glasses of wine per week    Comment: on social occassions  . Drug use: Yes    Frequency: 7.0 times per week    Types: Marijuana    Comment: daily use - last use 03/28/19  . Sexual activity: Not on file    Comment: Hysterectomy

## 2019-08-04 ENCOUNTER — Encounter: Payer: Self-pay | Admitting: Gastroenterology

## 2019-08-04 DIAGNOSIS — R110 Nausea: Secondary | ICD-10-CM | POA: Insufficient documentation

## 2019-08-04 DIAGNOSIS — K5900 Constipation, unspecified: Secondary | ICD-10-CM | POA: Insufficient documentation

## 2019-08-07 DIAGNOSIS — T7840XA Allergy, unspecified, initial encounter: Secondary | ICD-10-CM | POA: Insufficient documentation

## 2019-08-07 DIAGNOSIS — T782XXA Anaphylactic shock, unspecified, initial encounter: Secondary | ICD-10-CM | POA: Insufficient documentation

## 2019-08-08 ENCOUNTER — Telehealth: Payer: Self-pay

## 2019-08-08 ENCOUNTER — Encounter: Payer: Self-pay | Admitting: Physician Assistant

## 2019-08-08 ENCOUNTER — Ambulatory Visit (INDEPENDENT_AMBULATORY_CARE_PROVIDER_SITE_OTHER): Payer: Medicare Other | Admitting: Physician Assistant

## 2019-08-08 ENCOUNTER — Telehealth: Payer: Self-pay | Admitting: Orthopedic Surgery

## 2019-08-08 VITALS — Ht 64.0 in | Wt 170.0 lb

## 2019-08-08 DIAGNOSIS — M79672 Pain in left foot: Secondary | ICD-10-CM

## 2019-08-08 MED ORDER — HYDROCODONE-ACETAMINOPHEN 5-325 MG PO TABS: 1.0000 | ORAL_TABLET | Freq: Four times a day (QID) | ORAL | 0 refills | Status: DC | PRN

## 2019-08-08 MED ORDER — DOXYCYCLINE HYCLATE 100 MG PO TABS: 100.0000 mg | ORAL_TABLET | Freq: Every day | ORAL | 0 refills | Status: DC

## 2019-08-08 NOTE — Telephone Encounter (Signed)
Advised pt that we never intended on dialysis to provide wound care that she should continue with current plan and call with questions. She voiced understanding and will follow up as scheduled.

## 2019-08-08 NOTE — Telephone Encounter (Signed)
I called pt to advise of clinic manager response below. We did not write an order for wound care so I am not sure what miscommunication happened there but did advise pt that facility advised that they were not able to assist her in and out of her car. To call with any questions

## 2019-08-08 NOTE — Telephone Encounter (Signed)
Pt called stating the facility she goes to, to wrap her foot is now requiring a rx. The facility name is [facility] and pt would like a rx faxed to them today she can get her foot wrapped tomorrow morning.  [phone]

## 2019-08-08 NOTE — Telephone Encounter (Signed)
[facility] clinic manager at dialyses clinic  Informed that patient brought in note from her recent appt with [place] that she needs assistance to her car . [facility] Dy called in to notify that it is against company policy to take patient to her car ,. And also change her dressing for her wound . They do not do wound care in clinic at all. Also advised patient should get crutches or walker to assist her from her car to the clinic.

## 2019-08-08 NOTE — Progress Notes (Signed)
Office Visit Note   Patient: Rebekah Johnson           Date of Birth: 1960/01/08           MRN: [id number] Visit Date: 08/08/2019              Requested by: No referring provider defined for this encounter. PCP: Dallas Schimke, MD  Chief Complaint  Patient presents with  . Left Foot - Follow-up    09/2015 transmet amputation       HPI: Patient presents today for follow-up on her left foot.  She is status post left transmetatarsal amputation.  She has struggled with pain and a superficial abscess on the medial side of the amputation stump.  This is debrided by Dr. Sharol Given and feels better.  She states now the pain is gotten very sensitive on the lateral side of the stump.  She describes it as a mixture of electrical type symptoms and pain.  She denies any fever or chills.  She is sensitive even to light touch over this area and asks to remove her own dressing  Assessment & Plan: Visit Diagnoses: No diagnosis found.  Plan: Patient did not allow me to explore the area on the lateral side.  I did palpate deeply and was not able to express any purulent fluid.  She does not have any signs of infection but given her medial side of her stump I am going to keep her on antibiotics and have her reevaluated by Dr. Sharol Given next week.  If things got worse she is to call us right away  Follow-Up Instructions: No follow-ups on file.   Ortho Exam  Patient is alert, oriented, no adenopathy, well-dressed, normal affect, normal respiratory effort. Left foot.  Overall no swelling no cellulitis the medial side is healing in from previous callus debridement.  Minimally tender to palpation.  The lateral side has a small area of eschar that is very hypersensitive even to fairly light touch.  There is no Warth warmth there is no drainage I did try to express fluid and debrided but patient was very hesitant and kept moving her foot and was unable to tolerate this today  Imaging: No results found. No images are  attached to the encounter.  Labs: Lab Results  Component Value Date   HGBA1C 6.9 (A) 02/02/2019   HGBA1C 5.6 11/01/2018   HGBA1C 5.5 08/31/2018   ESRSEDRATE 57 (H) 07/13/2015   CRP 0.7 07/13/2015   LABURIC 5.7 07/13/2015   REPTSTATUS 01/04/2017 FINAL 01/04/2017   CULT (A) 01/01/2017    VIRIDANS STREPTOCOCCUS THE SIGNIFICANCE OF ISOLATING THIS ORGANISM FROM A SINGLE SET OF BLOOD CULTURES WHEN MULTIPLE SETS ARE DRAWN IS UNCERTAIN. PLEASE NOTIFY THE MICROBIOLOGY DEPARTMENT WITHIN ONE WEEK IF SPECIATION AND SENSITIVITIES ARE REQUIRED. Performed at [facility], [place] [address], [place], [place] [address]      Lab Results  Component Value Date   ALBUMIN 3.6 04/27/2019   ALBUMIN 3.6 03/21/2019   ALBUMIN 4.1 03/17/2019   LABURIC 5.7 07/13/2015    Lab Results  Component Value Date   MG 1.7 01/01/2017   MG 1.7 09/16/2016   MG 1.7 09/14/2016   No results found for: VD25OH  No results found for: PREALBUMIN CBC EXTENDED Latest Ref Rng & Units 04/27/2019 03/30/2019 03/21/2019  WBC 4.0 - 10.5 K/uL 8.5 - 12.9(H)  RBC 3.87 - 5.11 MIL/uL 4.49 - 4.22  HGB 12.0 - 15.0 g/dL 12.9 13.6 12.1  HCT  36 - 46 % 41.6 40.0 38.6  PLT 150 - 400 K/uL 334 - 265  NEUTROABS 1.7 - 7.7 K/uL 5.9 - 8.9(H)  LYMPHSABS 0.7 - 4.0 K/uL 1.9 - 2.9     Body mass index is 29.18 kg/m.  Orders:  No orders of the defined types were placed in this encounter.  Meds ordered this encounter  Medications  . doxycycline (VIBRA-TABS) 100 MG tablet    Sig: Take 1 tablet (100 mg total) by mouth daily.    Dispense:  14 tablet    Refill:  0     Procedures: No procedures performed  Clinical Data: No additional findings.  ROS:  All other systems negative, except as noted in the HPI. Review of Systems  Objective: Vital Signs: Ht 5\' 4"  (1.626 m)   Wt 170 lb (77.1 kg)   LMP  (LMP Unknown)   BMI 29.18 kg/m   Specialty Comments:  No specialty comments available.  PMFS History: Patient Active Problem  List   Diagnosis Date Noted  . Constipation 08/04/2019  . Nausea without vomiting 08/04/2019  . Multinodular goiter 08/31/2018  . Cardiac arrest ([place]) 11/22/2017  . DOE (dyspnea on exertion) 09/01/2017  . COPD GOLD  0 08/31/2017  . Diabetic polyneuropathy associated with type 2 diabetes mellitus ([place]) 08/19/2017  . NSTEMI (non-ST elevated myocardial infarction) ([place]) 01/01/2017  . Chest pain 01/01/2017  . Tobacco abuse   . PVD (peripheral vascular disease) ([place])   . Proteinuria   . Obesity   . Normocytic anemia   . Hypertension   . High cholesterol   . Foot amputation status   . Diabetes mellitus with neuropathy ([place])   . CKD (chronic kidney disease), stage IV ([place])   . Chronic diastolic CHF (congestive heart failure) ([place])   . CAD (coronary artery disease) 10/27/2016  . Chronic kidney disease 09/23/2016  . Acute congestive heart failure ([place])   . Hypertensive heart disease with heart failure ([place])   . Chronic combined systolic and diastolic heart failure ([place]) 09/14/2016  . Onychomycosis 12/24/2015  . Status post transmetatarsal amputation of foot, left ([place]) 09/06/2015  . Diabetic foot ulcer ([place]) 07/13/2015  . Cellulitis 07/12/2015  . HLD (hyperlipidemia) 09/02/2012  . Peripheral nerve disease 09/02/2012  . Type 2 diabetes mellitus with peripheral angiopathy ([place]) 09/02/2012  . Hypertriglyceridemia 07/28/2012  . Peripheral vascular disease ([place]) 07/28/2012  . Avitaminosis D 07/28/2012  . Essential (primary) hypertension 06/10/2012  . History of biliary T-tube placement 06/10/2012  . Leg pain 06/10/2012  . Current tobacco use 06/10/2012  . Atrophic vaginitis 06/07/2012  . Type 2 diabetes mellitus ([place]) 06/07/2012   Past Medical History:  Diagnosis Date  . Acute congestive heart failure ([place])   . Acute diastolic CHF (congestive heart failure) ([place]) 09/14/2016  . Atrophic vaginitis 06/07/2012  . Avitaminosis D 07/28/2012  . CAD (coronary artery disease)    a. NSTEM  09/2016: cath showing severe diffuse disease of the RCA, ramus and Cx with mild-mod disease of LAD; PCI would require multiple stents and significant contrast usage thus medical therapy recommended.  . Cellulitis 07/12/2015  . Chest pain 01/01/2017  . Chronic diastolic CHF (congestive heart failure) ([place])   . Chronic kidney disease 09/23/2016  . CKD (chronic kidney disease), stage IV ([place])   . Constipation   . COPD GOLD  0 08/31/2017   Quit smoking 07/17/17 - Spirometry 09/01/2017  FEV1 1.23 (58%)  Ratio 57 with mild curvature p no rx - 09/01/2017  After  extensive coaching inhaler device,  effectiveness =    75% with elipta with cough provoked  - PFT's  10/14/2017  FEV1 1.69 (68 % ) ratio 84  p no % improvement from saba p nothing prior to study with DLCO  50 % corrects to 76  % for alv volume   - 10/14/2017  Walked RA x 3 laps @ 1  . Current tobacco use 06/10/2012  . Diabetes mellitus with neuropathy ([place])   . Diabetic foot ulcer ([place]) 07/13/2015  . Diabetic polyneuropathy associated with type 2 diabetes mellitus ([place]) 08/19/2017  . DOE (dyspnea on exertion) 09/01/2017   09/01/2017  Walked RA x 3 laps @ 185 ft each stopped due to  End of study, nl to mod fast  pace, no  desat   But stopped to rest x 2  Spirometry 09/01/2017  FEV1 1.23 (58%)  Ratio 57  > trial of anoro    . Dyspnea    with exertion  . ESRD (end stage renal disease) ([place])    MWF - [facility]  . Essential (primary) hypertension 06/10/2012  . Foot amputation status    a. h/o left foot transmetatarsal amputation  . Headache    Migraines  . High cholesterol   . History of biliary T-tube placement 06/10/2012   Patient unaware   . History of cardiac arrest   . HLD (hyperlipidemia) 09/02/2012   Overview:  ICD-10 cut over    . Hypertension    a. patent renal arteries by PV angio 08/2015. b. heavy proteinuria 09/2016 ? nephrotic.  [name] Hypertensive heart disease with heart failure ([place])   . Hypertriglyceridemia 07/28/2012  . Leg pain 06/10/2012  .  Normocytic anemia    pt denies this dx  . NSTEMI (non-ST elevated myocardial infarction) ([place]) 01/01/2017  . Obesity   . Onychomycosis 12/24/2015  . Peripheral nerve disease 09/02/2012   legs  . Peripheral vascular disease ([place]) 07/28/2012  . Pneumonia 2018  . Proteinuria   . PVD (peripheral vascular disease) ([place])    a. status post left SFA and popliteal stent and left SFA PTA with drug coated balloon in 08/2015.  . Status post transmetatarsal amputation of foot, left ([place]) 09/06/2015  . Tobacco abuse    quit cigarettes 2019, smokes marijuana  . Type 2 diabetes mellitus ([place]) 06/07/2012  . Type 2 diabetes mellitus with peripheral angiopathy ([place]) 09/02/2012    Family History  Problem Relation Age of Onset  . Diabetes Mother   . Hypertension Father   . Colon cancer Neg Hx   . Stomach cancer Neg Hx   . Pancreatic cancer Neg Hx     Past Surgical History:  Procedure Laterality Date  . ABDOMINAL HYSTERECTOMY    . AMPUTATION Left 09/06/2015   Procedure: LEFT TRANSMETATARSAL AMPUTATTION;  Surgeon: Newt Minion, MD;  Location: [facility];  Service: Orthopedics;  Laterality: Left;  . AV FISTULA PLACEMENT Left 12/13/2018   Procedure: ARTERIOVENOUS (AV) FISTULA CREATION LEFT UPPER ARM;  Surgeon: Waynetta Sandy, MD;  Location: [facility];  Service: Vascular;  Laterality: Left;  . BASCILIC VEIN TRANSPOSITION Left 03/30/2019   Procedure: LEFT ARM ARTERIOVENOUS VEIN TRANSPOSITION;  Surgeon: Serafina Mitchell, MD;  [facility];  Service: Vascular;  Laterality: Left;  . COLONOSCOPY     polyps x 1  . LEFT HEART CATH AND CORONARY ANGIOGRAPHY N/A 09/17/2016   Procedure: LEFT HEART CATH AND CORONARY ANGIOGRAPHY;  Surgeon: Jettie Booze, MD;  Location: [facility];  Service: Cardiovascular;  Laterality: N/A;  . PERIPHERAL VASCULAR CATHETERIZATION N/A 08/16/2015   Procedure: Abdominal Aortogram;  Surgeon: Elam Dutch, MD;  Location: [facility];  Service: Cardiovascular;   Laterality: N/A;  . PERIPHERAL VASCULAR CATHETERIZATION Bilateral 08/16/2015   Procedure: Lower Extremity Angiography;  Surgeon: Elam Dutch, MD;  Location: [facility];  Service: Cardiovascular;  Laterality: Bilateral;  . PERIPHERAL VASCULAR CATHETERIZATION Left 08/16/2015   Procedure: Peripheral Vascular Intervention;  Surgeon: Elam Dutch, MD;  Location: [facility];  Service: Cardiovascular;  Laterality: Left;  SFA STENT X 2   Social History   Occupational History  . Not on file  Tobacco Use  . Smoking status: Former Smoker    Packs/day: 1.00    Years: 42.00    Pack years: 42.00    Types: Cigarettes    Quit date: 07/17/2017    Years since quitting: 2.0  . Smokeless tobacco: Never Used  Vaping Use  . Vaping Use: Never used  Substance and Sexual Activity  . Alcohol use: Not Currently    Alcohol/week: 1.0 - 2.0 standard drink    Types: 1 - 2 Glasses of wine per week    Comment: on social occassions  . Drug use: Yes    Frequency: 7.0 times per week    Types: Marijuana    Comment: daily use - last use 03/28/19  . Sexual activity: Not on file    Comment: Hysterectomy

## 2019-08-15 ENCOUNTER — Encounter: Payer: Self-pay | Admitting: Orthopedic Surgery

## 2019-08-15 ENCOUNTER — Ambulatory Visit (INDEPENDENT_AMBULATORY_CARE_PROVIDER_SITE_OTHER): Payer: Medicare Other | Admitting: Orthopedic Surgery

## 2019-08-15 VITALS — Ht 64.0 in | Wt 170.0 lb

## 2019-08-15 DIAGNOSIS — M79672 Pain in left foot: Secondary | ICD-10-CM | POA: Diagnosis not present

## 2019-08-15 DIAGNOSIS — E1151 Type 2 diabetes mellitus with diabetic peripheral angiopathy without gangrene: Secondary | ICD-10-CM

## 2019-08-15 DIAGNOSIS — Z89432 Acquired absence of left foot: Secondary | ICD-10-CM | POA: Diagnosis not present

## 2019-08-15 MED ORDER — NITROGLYCERIN 0.2 MG/HR TD PT24
0.2000 mg | MEDICATED_PATCH | Freq: Every day | TRANSDERMAL | 12 refills | Status: DC
Start: 2019-08-15 — End: 2020-04-01

## 2019-08-15 NOTE — Progress Notes (Signed)
Office Visit Note   Patient: Rebekah Johnson           Date of Birth: 05-05-1960           MRN: [id number] Visit Date: 08/15/2019              Requested by: No referring provider defined for this encounter. PCP: Dallas Schimke, MD  Chief Complaint  Patient presents with  . Left Foot - Follow-up      HPI: Patient is a 59 year old woman with peripheral vascular disease and diabetes status post a left transmetatarsal amputation patient complains of pain over the dorsum of her foot.  Assessment & Plan: Visit Diagnoses:  1. Type 2 diabetes mellitus with peripheral angiopathy (HCC)   2. Pain in left foot   3. Status post transmetatarsal amputation of foot, left ([place])     Plan: Will have patient use a nitroglycerin patch on the dorsum of her foot change location daily in line with the middle of the ankle.  She will complete her course of doxycycline.  Patient symptoms seem to be primarily ischemic.  Follow-Up Instructions: Return in about 3 weeks (around 09/05/2019).   Ortho Exam  Patient is alert, oriented, no adenopathy, well-dressed, normal affect, normal respiratory effort. Examination patient has a faint palpable dorsalis pedis pulse the foot is cool to the touch she does have good wrinkling of the skin without swelling there are no black ischemic ulcers no drainage no cellulitis she is on Plavix.  She has a superficial healing area 10 mm in diameter over the first metatarsal that is 1 mm deep and a small crack in the skin laterally there is no exposed bone or tendons no purulent drainage.  Imaging: No results found. No images are attached to the encounter.  Labs: Lab Results  Component Value Date   HGBA1C 6.9 (A) 02/02/2019   HGBA1C 5.6 11/01/2018   HGBA1C 5.5 08/31/2018   ESRSEDRATE 57 (H) 07/13/2015   CRP 0.7 07/13/2015   LABURIC 5.7 07/13/2015   REPTSTATUS 01/04/2017 FINAL 01/04/2017   CULT (A) 01/01/2017    VIRIDANS STREPTOCOCCUS THE SIGNIFICANCE OF  ISOLATING THIS ORGANISM FROM A SINGLE SET OF BLOOD CULTURES WHEN MULTIPLE SETS ARE DRAWN IS UNCERTAIN. PLEASE NOTIFY THE MICROBIOLOGY DEPARTMENT WITHIN ONE WEEK IF SPECIATION AND SENSITIVITIES ARE REQUIRED. Performed at [facility], [place] [address] [place], [place] [address]      Lab Results  Component Value Date   ALBUMIN 3.6 04/27/2019   ALBUMIN 3.6 03/21/2019   ALBUMIN 4.1 03/17/2019   LABURIC 5.7 07/13/2015    Lab Results  Component Value Date   MG 1.7 01/01/2017   MG 1.7 09/16/2016   MG 1.7 09/14/2016   No results found for: VD25OH  No results found for: PREALBUMIN CBC EXTENDED Latest Ref Rng & Units 04/27/2019 03/30/2019 03/21/2019  WBC 4.0 - 10.5 K/uL 8.5 - 12.9(H)  RBC 3.87 - 5.11 MIL/uL 4.49 - 4.22  HGB 12.0 - 15.0 g/dL 12.9 13.6 12.1  HCT 36 - 46 % 41.6 40.0 38.6  PLT 150 - 400 K/uL 334 - 265  NEUTROABS 1.7 - 7.7 K/uL 5.9 - 8.9(H)  LYMPHSABS 0.7 - 4.0 K/uL 1.9 - 2.9     Body mass index is 29.18 kg/m.  Orders:  No orders of the defined types were placed in this encounter.  Meds ordered this encounter  Medications  . nitroGLYCERIN (NITRODUR - DOSED IN MG/24 HR) 0.2 mg/hr patch  Sig: Place 1 patch (0.2 mg total) onto the skin daily.    Dispense:  30 patch    Refill:  12     Procedures: No procedures performed  Clinical Data: No additional findings.  ROS:  All other systems negative, except as noted in the HPI. Review of Systems  Objective: Vital Signs: Ht 5\' 4"  (1.626 m)   Wt 170 lb (77.1 kg)   LMP  (LMP Unknown)   BMI 29.18 kg/m   Specialty Comments:  No specialty comments available.  PMFS History: Patient Active Problem List   Diagnosis Date Noted  . Constipation 08/04/2019  . Nausea without vomiting 08/04/2019  . Multinodular goiter 08/31/2018  . Cardiac arrest ([place]) 11/22/2017  . DOE (dyspnea on exertion) 09/01/2017  . COPD GOLD  0 08/31/2017  . Diabetic polyneuropathy associated with type 2 diabetes mellitus ([place])  08/19/2017  . NSTEMI (non-ST elevated myocardial infarction) ([place]) 01/01/2017  . Chest pain 01/01/2017  . Tobacco abuse   . PVD (peripheral vascular disease) ([place])   . Proteinuria   . Obesity   . Normocytic anemia   . Hypertension   . High cholesterol   . Foot amputation status   . Diabetes mellitus with neuropathy ([place])   . CKD (chronic kidney disease), stage IV ([place])   . Chronic diastolic CHF (congestive heart failure) ([place])   . CAD (coronary artery disease) 10/27/2016  . Chronic kidney disease 09/23/2016  . Acute congestive heart failure ([place])   . Hypertensive heart disease with heart failure ([place])   . Chronic combined systolic and diastolic heart failure ([place]) 09/14/2016  . Onychomycosis 12/24/2015  . Status post transmetatarsal amputation of foot, left ([place]) 09/06/2015  . Diabetic foot ulcer ([place]) 07/13/2015  . Cellulitis 07/12/2015  . HLD (hyperlipidemia) 09/02/2012  . Peripheral nerve disease 09/02/2012  . Type 2 diabetes mellitus with peripheral angiopathy ([place]) 09/02/2012  . Hypertriglyceridemia 07/28/2012  . Peripheral vascular disease ([place]) 07/28/2012  . Avitaminosis D 07/28/2012  . Essential (primary) hypertension 06/10/2012  . History of biliary T-tube placement 06/10/2012  . Leg pain 06/10/2012  . Current tobacco use 06/10/2012  . Atrophic vaginitis 06/07/2012  . Type 2 diabetes mellitus ([place]) 06/07/2012   Past Medical History:  Diagnosis Date  . Acute congestive heart failure ([place])   . Acute diastolic CHF (congestive heart failure) ([place]) 09/14/2016  . Atrophic vaginitis 06/07/2012  . Avitaminosis D 07/28/2012  . CAD (coronary artery disease)    a. NSTEM 09/2016: cath showing severe diffuse disease of the RCA, ramus and Cx with mild-mod disease of LAD; PCI would require multiple stents and significant contrast usage thus medical therapy recommended.  . Cellulitis 07/12/2015  . Chest pain 01/01/2017  . Chronic diastolic CHF (congestive heart failure) ([place])   .  Chronic kidney disease 09/23/2016  . CKD (chronic kidney disease), stage IV ([place])   . Constipation   . COPD GOLD  0 08/31/2017   Quit smoking 07/17/17 - Spirometry 09/01/2017  FEV1 1.23 (58%)  Ratio 57 with mild curvature p no rx - 09/01/2017  After extensive coaching inhaler device,  effectiveness =    75% with elipta with cough provoked  - PFT's  10/14/2017  FEV1 1.69 (68 % ) ratio 84  p no % improvement from saba p nothing prior to study with DLCO  50 % corrects to 76  % for alv volume   - 10/14/2017  Walked RA x 3 laps @ 1  . Current tobacco use 06/10/2012  .  Diabetes mellitus with neuropathy ([place])   . Diabetic foot ulcer ([place]) 07/13/2015  . Diabetic polyneuropathy associated with type 2 diabetes mellitus ([place]) 08/19/2017  . DOE (dyspnea on exertion) 09/01/2017   09/01/2017  Walked RA x 3 laps @ 185 ft each stopped due to  End of study, nl to mod fast  pace, no  desat   But stopped to rest x 2  Spirometry 09/01/2017  FEV1 1.23 (58%)  Ratio 57  > trial of anoro    . Dyspnea    with exertion  . ESRD (end stage renal disease) ([place])    MWF - [facility]  . Essential (primary) hypertension 06/10/2012  . Foot amputation status    a. h/o left foot transmetatarsal amputation  . Headache    Migraines  . High cholesterol   . History of biliary T-tube placement 06/10/2012   Patient unaware   . History of cardiac arrest   . HLD (hyperlipidemia) 09/02/2012   Overview:  ICD-10 cut over    . Hypertension    a. patent renal arteries by PV angio 08/2015. b. heavy proteinuria 09/2016 ? nephrotic.  [name] Hypertensive heart disease with heart failure ([place])   . Hypertriglyceridemia 07/28/2012  . Leg pain 06/10/2012  . Normocytic anemia    pt denies this dx  . NSTEMI (non-ST elevated myocardial infarction) ([place]) 01/01/2017  . Obesity   . Onychomycosis 12/24/2015  . Peripheral nerve disease 09/02/2012   legs  . Peripheral vascular disease ([place]) 07/28/2012  . Pneumonia 2018  . Proteinuria   . PVD (peripheral vascular  disease) ([place])    a. status post left SFA and popliteal stent and left SFA PTA with drug coated balloon in 08/2015.  . Status post transmetatarsal amputation of foot, left ([place]) 09/06/2015  . Tobacco abuse    quit cigarettes 2019, smokes marijuana  . Type 2 diabetes mellitus ([place]) 06/07/2012  . Type 2 diabetes mellitus with peripheral angiopathy ([name]) 09/02/2012    Family History  Problem Relation Age of Onset  . Diabetes Mother   . Hypertension Father   . Colon cancer Neg Hx   . Stomach cancer Neg Hx   . Pancreatic cancer Neg Hx     Past Surgical History:  Procedure Laterality Date  . ABDOMINAL HYSTERECTOMY    . AMPUTATION Left 09/06/2015   Procedure: LEFT TRANSMETATARSAL AMPUTATTION;  Surgeon: Newt Minion, MD;  Location: [facility];  Service: Orthopedics;  Laterality: Left;  . AV FISTULA PLACEMENT Left 12/13/2018   Procedure: ARTERIOVENOUS (AV) FISTULA CREATION LEFT UPPER ARM;  Surgeon: Waynetta Sandy, MD;  Location: [facility];  Service: Vascular;  Laterality: Left;  . BASCILIC VEIN TRANSPOSITION Left 03/30/2019   Procedure: LEFT ARM ARTERIOVENOUS VEIN TRANSPOSITION;  Surgeon: Serafina Mitchell, MD;  [facility];  Service: Vascular;  Laterality: Left;  . COLONOSCOPY     polyps x 1  . LEFT HEART CATH AND CORONARY ANGIOGRAPHY N/A 09/17/2016   Procedure: LEFT HEART CATH AND CORONARY ANGIOGRAPHY;  Surgeon: Jettie Booze, MD;  Location: [facility];  Service: Cardiovascular;  Laterality: N/A;  . PERIPHERAL VASCULAR CATHETERIZATION N/A 08/16/2015   Procedure: Abdominal Aortogram;  Surgeon: Elam Dutch, MD;  Location: [facility];  Service: Cardiovascular;  Laterality: N/A;  . PERIPHERAL VASCULAR CATHETERIZATION Bilateral 08/16/2015   Procedure: Lower Extremity Angiography;  Surgeon: Elam Dutch, MD;  Location: [facility];  Service: Cardiovascular;  Laterality: Bilateral;  . PERIPHERAL VASCULAR CATHETERIZATION Left  08/16/2015   Procedure: Peripheral  Vascular Intervention;  Surgeon: Elam Dutch, MD;  Location: [facility];  Service: Cardiovascular;  Laterality: Left;  SFA STENT X 2   Social History   Occupational History  . Not on file  Tobacco Use  . Smoking status: Former Smoker    Packs/day: 1.00    Years: 42.00    Pack years: 42.00    Types: Cigarettes    Quit date: 07/17/2017    Years since quitting: 2.0  . Smokeless tobacco: Never Used  Vaping Use  . Vaping Use: Never used  Substance and Sexual Activity  . Alcohol use: Not Currently    Alcohol/week: 1.0 - 2.0 standard drink    Types: 1 - 2 Glasses of wine per week    Comment: on social occassions  . Drug use: Yes    Frequency: 7.0 times per week    Types: Marijuana    Comment: daily use - last use 03/28/19  . Sexual activity: Not on file    Comment: Hysterectomy

## 2019-08-22 ENCOUNTER — Other Ambulatory Visit: Payer: Self-pay

## 2019-08-22 MED ORDER — AMLODIPINE BESYLATE 5 MG PO TABS: 5.0000 mg | ORAL_TABLET | Freq: Every day | ORAL | 11 refills | Status: DC

## 2019-08-22 NOTE — Telephone Encounter (Signed)
Pt's pharmacy is requesting a refill on amlodipine. Would Dr. [name] like to refill this medication? Please address

## 2019-08-22 NOTE — Progress Notes (Signed)
I agree with the above note, plan 

## 2019-08-31 ENCOUNTER — Telehealth: Payer: Self-pay

## 2019-08-31 NOTE — Telephone Encounter (Signed)
Patient called in wanting refill on her infection pills

## 2019-08-31 NOTE — Telephone Encounter (Signed)
I called pt and advised that she received an abx on 08/08/19 and at the last visit was advised to complete the course. She has an appt on 09/05/19 at will re-eval if continued coverage is needed. Pt voiced understanding and will call with any other  questions.

## 2019-09-05 ENCOUNTER — Encounter: Payer: Self-pay | Admitting: Orthopedic Surgery

## 2019-09-05 ENCOUNTER — Ambulatory Visit (INDEPENDENT_AMBULATORY_CARE_PROVIDER_SITE_OTHER): Payer: Medicare Other | Admitting: Orthopedic Surgery

## 2019-09-05 ENCOUNTER — Other Ambulatory Visit: Payer: Self-pay | Admitting: *Deleted

## 2019-09-05 DIAGNOSIS — Z89432 Acquired absence of left foot: Secondary | ICD-10-CM

## 2019-09-05 DIAGNOSIS — I739 Peripheral vascular disease, unspecified: Secondary | ICD-10-CM

## 2019-09-05 NOTE — Progress Notes (Signed)
Office Visit Note   Patient: Rebekah Johnson           Date of Birth: [birth year]           MRN: [id number] Visit Date: 09/05/2019              Requested by: No referring provider defined for this encounter. PCP: Dallas Schimke, MD  Chief Complaint  Patient presents with  . Left Foot - Follow-up      HPI: Patient is a 59 year old woman who is status post a left transmetatarsal amputation she is currently on Plavix and aspirin she has completed her course of doxycycline and she is using a nitroglycerin patch.  Patient complains of rest pain which she describes as a burning pain in the left forefoot.   Assessment & Plan: Visit Diagnoses:  1. Status post transmetatarsal amputation of foot, left ([place])   2. Peripheral vascular disease ([place])     Plan: Recommended patient call to get a follow-up appointment with vascular vein surgery to see if there is any vascular intervention for her claudication pain at rest.  Recommend that she continue using the nitroglycerin patch.  Follow-Up Instructions: Return in about 4 weeks (around 10/03/2019).   Ortho Exam  Patient is alert, oriented, no adenopathy, well-dressed, normal affect, normal respiratory effort. Examination patient's to ischemic wound does have a scab appear to be well-healed there is no cellulitis no drainage no clinical signs of infection.  Patient's ankle has dorsiflexion to neutral.  Patient has tenderness to light touch.  Imaging: No results found. No images are attached to the encounter.  Labs: Lab Results  Component Value Date   HGBA1C 6.9 (A) 02/02/2019   HGBA1C 5.6 11/01/2018   HGBA1C 5.5 08/31/2018   ESRSEDRATE 57 (H) 07/13/2015   CRP 0.7 07/13/2015   LABURIC 5.7 07/13/2015   REPTSTATUS 01/04/2017 FINAL 01/04/2017   CULT (A) 01/01/2017    VIRIDANS STREPTOCOCCUS THE SIGNIFICANCE OF ISOLATING THIS ORGANISM FROM A SINGLE SET OF BLOOD CULTURES WHEN MULTIPLE SETS ARE DRAWN IS UNCERTAIN. PLEASE NOTIFY THE  MICROBIOLOGY DEPARTMENT WITHIN ONE WEEK IF SPECIATION AND SENSITIVITIES ARE REQUIRED. Performed at [facility], [place] [address] [place], [place] [address]      Lab Results  Component Value Date   ALBUMIN 3.6 04/27/2019   ALBUMIN 3.6 03/21/2019   ALBUMIN 4.1 03/17/2019   LABURIC 5.7 07/13/2015    Lab Results  Component Value Date   MG 1.7 01/01/2017   MG 1.7 09/16/2016   MG 1.7 09/14/2016   No results found for: VD25OH  No results found for: PREALBUMIN CBC EXTENDED Latest Ref Rng & Units 04/27/2019 03/30/2019 03/21/2019  WBC 4.0 - 10.5 K/uL 8.5 - 12.9(H)  RBC 3.87 - 5.11 MIL/uL 4.49 - 4.22  HGB 12.0 - 15.0 g/dL 12.9 13.6 12.1  HCT 36 - 46 % 41.6 40.0 38.6  PLT 150 - 400 K/uL 334 - 265  NEUTROABS 1.7 - 7.7 K/uL 5.9 - 8.9(H)  LYMPHSABS 0.7 - 4.0 K/uL 1.9 - 2.9     There is no height or weight on file to calculate BMI.  Orders:  No orders of the defined types were placed in this encounter.  No orders of the defined types were placed in this encounter.    Procedures: No procedures performed  Clinical Data: No additional findings.  ROS:  All other systems negative, except as noted in the HPI. Review of Systems  Objective: Vital Signs: LMP  (LMP Unknown)  Specialty Comments:  No specialty comments available.  PMFS History: Patient Active Problem List   Diagnosis Date Noted  . Constipation 08/04/2019  . Nausea without vomiting 08/04/2019  . Multinodular goiter 08/31/2018  . Cardiac arrest ([place]) 11/22/2017  . DOE (dyspnea on exertion) 09/01/2017  . COPD GOLD  0 08/31/2017  . Diabetic polyneuropathy associated with type 2 diabetes mellitus ([place]) 08/19/2017  . NSTEMI (non-ST elevated myocardial infarction) ([place]) 01/01/2017  . Chest pain 01/01/2017  . Tobacco abuse   . PVD (peripheral vascular disease) ([place])   . Proteinuria   . Obesity   . Normocytic anemia   . Hypertension   . High cholesterol   . Foot amputation status   . Diabetes mellitus  with neuropathy ([place])   . CKD (chronic kidney disease), stage IV ([place])   . Chronic diastolic CHF (congestive heart failure) ([place])   . CAD (coronary artery disease) 10/27/2016  . Chronic kidney disease 09/23/2016  . Acute congestive heart failure ([place])   . Hypertensive heart disease with heart failure ([place])   . Chronic combined systolic and diastolic heart failure ([place]) 09/14/2016  . Onychomycosis 12/24/2015  . Status post transmetatarsal amputation of foot, left ([place]) 09/06/2015  . Diabetic foot ulcer ([place]) 07/13/2015  . Cellulitis 07/12/2015  . HLD (hyperlipidemia) 09/02/2012  . Peripheral nerve disease 09/02/2012  . Type 2 diabetes mellitus with peripheral angiopathy ([place]) 09/02/2012  . Hypertriglyceridemia 07/28/2012  . Peripheral vascular disease ([place]) 07/28/2012  . Avitaminosis D 07/28/2012  . Essential (primary) hypertension 06/10/2012  . History of biliary T-tube placement 06/10/2012  . Leg pain 06/10/2012  . Current tobacco use 06/10/2012  . Atrophic vaginitis 06/07/2012  . Type 2 diabetes mellitus ([place]) 06/07/2012   Past Medical History:  Diagnosis Date  . Acute congestive heart failure ([place])   . Acute diastolic CHF (congestive heart failure) ([place]) 09/14/2016  . Atrophic vaginitis 06/07/2012  . Avitaminosis D 07/28/2012  . CAD (coronary artery disease)    a. NSTEM 09/2016: cath showing severe diffuse disease of the RCA, ramus and Cx with mild-mod disease of LAD; PCI would require multiple stents and significant contrast usage thus medical therapy recommended.  . Cellulitis 07/12/2015  . Chest pain 01/01/2017  . Chronic diastolic CHF (congestive heart failure) ([place])   . Chronic kidney disease 09/23/2016  . CKD (chronic kidney disease), stage IV ([place])   . Constipation   . COPD GOLD  0 08/31/2017   Quit smoking 07/17/17 - Spirometry 09/01/2017  FEV1 1.23 (58%)  Ratio 57 with mild curvature p no rx - 09/01/2017  After extensive coaching inhaler device,  effectiveness =    75% with  elipta with cough provoked  - PFT's  10/14/2017  FEV1 1.69 (68 % ) ratio 84  p no % improvement from saba p nothing prior to study with DLCO  50 % corrects to 76  % for alv volume   - 10/14/2017  Walked RA x 3 laps @ 1  . Current tobacco use 06/10/2012  . Diabetes mellitus with neuropathy ([place])   . Diabetic foot ulcer ([place]) 07/13/2015  . Diabetic polyneuropathy associated with type 2 diabetes mellitus ([place]) 08/19/2017  . DOE (dyspnea on exertion) 09/01/2017   09/01/2017  Walked RA x 3 laps @ 185 ft each stopped due to  End of study, nl to mod fast  pace, no  desat   But stopped to rest x 2  Spirometry 09/01/2017  FEV1 1.23 (58%)  Ratio 57  > trial of  anoro    . Dyspnea    with exertion  . ESRD (end stage renal disease) ([place])    MWF - [facility]  . Essential (primary) hypertension 06/10/2012  . Foot amputation status    a. h/o left foot transmetatarsal amputation  . Headache    Migraines  . High cholesterol   . History of biliary T-tube placement 06/10/2012   Patient unaware   . History of cardiac arrest   . HLD (hyperlipidemia) 09/02/2012   Overview:  ICD-10 cut over    . Hypertension    a. patent renal arteries by PV angio 08/2015. b. heavy proteinuria 09/2016 ? nephrotic.  [name] Hypertensive heart disease with heart failure ([place])   . Hypertriglyceridemia 07/28/2012  . Leg pain 06/10/2012  . Normocytic anemia    pt denies this dx  . NSTEMI (non-ST elevated myocardial infarction) ([place]) 01/01/2017  . Obesity   . Onychomycosis 12/24/2015  . Peripheral nerve disease 09/02/2012   legs  . Peripheral vascular disease ([place]) 07/28/2012  . Pneumonia 2018  . Proteinuria   . PVD (peripheral vascular disease) ([place])    a. status post left SFA and popliteal stent and left SFA PTA with drug coated balloon in 08/2015.  . Status post transmetatarsal amputation of foot, left ([place]) 09/06/2015  . Tobacco abuse    quit cigarettes 2019, smokes marijuana  . Type 2 diabetes mellitus ([place]) 06/07/2012  . Type 2 diabetes  mellitus with peripheral angiopathy ([place]) 09/02/2012    Family History  Problem Relation Age of Onset  . Diabetes Mother   . Hypertension Father   . Colon cancer Neg Hx   . Stomach cancer Neg Hx   . Pancreatic cancer Neg Hx     Past Surgical History:  Procedure Laterality Date  . ABDOMINAL HYSTERECTOMY    . AMPUTATION Left 09/06/2015   Procedure: LEFT TRANSMETATARSAL AMPUTATTION;  Surgeon: Newt Minion, MD;  Location: [facility];  Service: Orthopedics;  Laterality: Left;  . AV FISTULA PLACEMENT Left 12/13/2018   Procedure: ARTERIOVENOUS (AV) FISTULA CREATION LEFT UPPER ARM;  Surgeon: Waynetta Sandy, MD;  Location: [facility];  Service: Vascular;  Laterality: Left;  . BASCILIC VEIN TRANSPOSITION Left 03/30/2019   Procedure: LEFT ARM ARTERIOVENOUS VEIN TRANSPOSITION;  Surgeon: Serafina Mitchell, MD;  [facility];  Service: Vascular;  Laterality: Left;  . COLONOSCOPY     polyps x 1  . LEFT HEART CATH AND CORONARY ANGIOGRAPHY N/A 09/17/2016   Procedure: LEFT HEART CATH AND CORONARY ANGIOGRAPHY;  Surgeon: Jettie Booze, MD;  Location: [facility];  Service: Cardiovascular;  Laterality: N/A;  . PERIPHERAL VASCULAR CATHETERIZATION N/A 08/16/2015   Procedure: Abdominal Aortogram;  Surgeon: Elam Dutch, MD;  Location: [facility];  Service: Cardiovascular;  Laterality: N/A;  . PERIPHERAL VASCULAR CATHETERIZATION Bilateral 08/16/2015   Procedure: Lower Extremity Angiography;  Surgeon: Elam Dutch, MD;  Location: [facility];  Service: Cardiovascular;  Laterality: Bilateral;  . PERIPHERAL VASCULAR CATHETERIZATION Left 08/16/2015   Procedure: Peripheral Vascular Intervention;  Surgeon: Elam Dutch, MD;  Location: [facility];  Service: Cardiovascular;  Laterality: Left;  SFA STENT X 2   Social History   Occupational History  . Not on file  Tobacco Use  . Smoking status: Former Smoker    Packs/day: 1.00    Years: 42.00    Pack years: 42.00     Types: Cigarettes    Quit date: 07/17/2017  Years since quitting: 2.1  . Smokeless tobacco: Never Used  Vaping Use  . Vaping Use: Never used  Substance and Sexual Activity  . Alcohol use: Not Currently    Alcohol/week: 1.0 - 2.0 standard drink    Types: 1 - 2 Glasses of wine per week    Comment: on social occassions  . Drug use: Yes    Frequency: 7.0 times per week    Types: Marijuana    Comment: daily use - last use 03/28/19  . Sexual activity: Not on file    Comment: Hysterectomy

## 2019-09-06 ENCOUNTER — Ambulatory Visit: Payer: Medicare Other | Admitting: Vascular Surgery

## 2019-09-06 ENCOUNTER — Encounter (HOSPITAL_COMMUNITY): Payer: Medicare Other

## 2019-09-13 ENCOUNTER — Emergency Department (HOSPITAL_COMMUNITY)
Admission: EM | Admit: 2019-09-13 | Discharge: 2019-09-14 | Disposition: A | Discharge status: Home / Self Care | Payer: Medicare Other | Attending: Emergency Medicine | Admitting: Emergency Medicine

## 2019-09-13 ENCOUNTER — Encounter: Payer: Self-pay | Admitting: Physician Assistant

## 2019-09-13 ENCOUNTER — Ambulatory Visit (INDEPENDENT_AMBULATORY_CARE_PROVIDER_SITE_OTHER): Payer: Medicare Other | Admitting: Physician Assistant

## 2019-09-13 ENCOUNTER — Other Ambulatory Visit: Payer: Self-pay

## 2019-09-13 ENCOUNTER — Encounter (HOSPITAL_COMMUNITY): Payer: Self-pay | Admitting: Emergency Medicine

## 2019-09-13 VITALS — Ht 64.0 in | Wt 170.0 lb

## 2019-09-13 DIAGNOSIS — M79672 Pain in left foot: Secondary | ICD-10-CM | POA: Insufficient documentation

## 2019-09-13 DIAGNOSIS — Z5321 Procedure and treatment not carried out due to patient leaving prior to being seen by health care provider: Secondary | ICD-10-CM | POA: Insufficient documentation

## 2019-09-13 DIAGNOSIS — I739 Peripheral vascular disease, unspecified: Secondary | ICD-10-CM

## 2019-09-13 LAB — CBC
HCT: 39.2 % (ref 36.0–46.0)
Hemoglobin: 12.1 g/dL (ref 12.0–15.0)
MCH: 29.1 pg (ref 26.0–34.0)
MCHC: 30.9 g/dL (ref 30.0–36.0)
MCV: 94.2 fL (ref 80.0–100.0)
Platelets: 271 10*3/uL (ref 150–400)
RBC: 4.16 MIL/uL (ref 3.87–5.11)
RDW: 16.1 % — ABNORMAL HIGH (ref 11.5–15.5)
WBC: 13.7 10*3/uL — ABNORMAL HIGH (ref 4.0–10.5)
nRBC: 0 % (ref 0.0–0.2)

## 2019-09-13 LAB — BASIC METABOLIC PANEL
Anion gap: 16 — ABNORMAL HIGH (ref 5–15)
BUN: 42 mg/dL — ABNORMAL HIGH (ref 8–23)
CO2: 27 mmol/L (ref 22–32)
Calcium: 9.3 mg/dL (ref 8.9–10.3)
Chloride: 92 mmol/L — ABNORMAL LOW (ref 98–111)
Creatinine, Ser: 6.64 mg/dL — ABNORMAL HIGH (ref 0.44–1.00)
GFR calc Af Amer: 7 mL/min — ABNORMAL LOW (ref >60)
GFR calc non Af Amer: 6 mL/min — ABNORMAL LOW (ref >60)
Glucose, Bld: 138 mg/dL — ABNORMAL HIGH (ref 70–99)
Potassium: 3.5 mmol/L (ref 3.5–5.1)
Sodium: 135 mmol/L (ref 135–145)

## 2019-09-13 NOTE — ED Notes (Signed)
Triage RN Gerald Stabs notified of patient labs along with the consistent vomitting since ive been on shift at 7pm.

## 2019-09-13 NOTE — ED Triage Notes (Addendum)
Patient arrives to ED with complaints of left foot pain that started at 3am this morning. Pt states the pain continues to get worse and is constant. Pt had all the toes on the foot amputated in 2017. Wound on top of foot. Pulses 2+.

## 2019-09-13 NOTE — ED Notes (Signed)
Patient states "I been sick and thowing up and yall aint giving me nothing for pain aint doing nothin for me im leaving!"

## 2019-09-13 NOTE — Progress Notes (Signed)
Office Visit Note   Patient: Rebekah Johnson           Date of Birth: [birth year]           MRN: [id number] Visit Date: 09/13/2019              Requested by: No referring provider defined for this encounter. PCP: Dallas Schimke, MD  Chief Complaint  Patient presents with  . Left Foot - Pain    Hx transmet amputation       HPI: Patient is here to be evaluated for continued ischemic pain in her left transmetatarsal amputation stump.  She was recently seen about a week ago and Dr. Sharol Given had wanted her to get a vascular study.  She states the earliest they could see her is the end of October and she has made an appointment to do so.  Unfortunately her ischemic pain has gotten significantly worse  Assessment & Plan: Visit Diagnoses: No diagnosis found.  Plan: Patient was seen today by Dr. Sharol Given we recommended referral to the emergency department for admission for her vascular work-up.  Follow-Up Instructions: No follow-ups on file.   Ortho Exam  Patient is alert, oriented, no adenopathy, well-dressed, normal affect, normal respiratory effort. Left transmetatarsal amputation stump extremely tender to palpation.  Does not even tolerate light touch.  No obvious signs of infection.  Nitroglycerin patches and should in place.  No wound dehiscence  Imaging: No results found. No images are attached to the encounter.  Labs: Lab Results  Component Value Date   HGBA1C 6.9 (A) 02/02/2019   HGBA1C 5.6 11/01/2018   HGBA1C 5.5 08/31/2018   ESRSEDRATE 57 (H) 07/13/2015   CRP 0.7 07/13/2015   LABURIC 5.7 07/13/2015   REPTSTATUS 01/04/2017 FINAL 01/04/2017   CULT (A) 01/01/2017    VIRIDANS STREPTOCOCCUS THE SIGNIFICANCE OF ISOLATING THIS ORGANISM FROM A SINGLE SET OF BLOOD CULTURES WHEN MULTIPLE SETS ARE DRAWN IS UNCERTAIN. PLEASE NOTIFY THE MICROBIOLOGY DEPARTMENT WITHIN ONE WEEK IF SPECIATION AND SENSITIVITIES ARE REQUIRED. Performed at [facility], [place] [address]  [place], [place] [address]      Lab Results  Component Value Date   ALBUMIN 3.6 04/27/2019   ALBUMIN 3.6 03/21/2019   ALBUMIN 4.1 03/17/2019   LABURIC 5.7 07/13/2015    Lab Results  Component Value Date   MG 1.7 01/01/2017   MG 1.7 09/16/2016   MG 1.7 09/14/2016   No results found for: VD25OH  No results found for: PREALBUMIN CBC EXTENDED Latest Ref Rng & Units 04/27/2019 03/30/2019 03/21/2019  WBC 4.0 - 10.5 K/uL 8.5 - 12.9(H)  RBC 3.87 - 5.11 MIL/uL 4.49 - 4.22  HGB 12.0 - 15.0 g/dL 12.9 13.6 12.1  HCT 36 - 46 % 41.6 40.0 38.6  PLT 150 - 400 K/uL 334 - 265  NEUTROABS 1.7 - 7.7 K/uL 5.9 - 8.9(H)  LYMPHSABS 0.7 - 4.0 K/uL 1.9 - 2.9     Body mass index is 29.18 kg/m.  Orders:  No orders of the defined types were placed in this encounter.  No orders of the defined types were placed in this encounter.    Procedures: No procedures performed  Clinical Data: No additional findings.  ROS:  All other systems negative, except as noted in the HPI. Review of Systems  Objective: Vital Signs: Ht 5\' 4"  (1.626 m)   Wt 170 lb (77.1 kg)   LMP  (LMP Unknown)   BMI 29.18 kg/m   Specialty Comments:  No specialty comments available.  PMFS History: Patient Active Problem List   Diagnosis Date Noted  . Constipation 08/04/2019  . Nausea without vomiting 08/04/2019  . Multinodular goiter 08/31/2018  . Cardiac arrest ([place]) 11/22/2017  . DOE (dyspnea on exertion) 09/01/2017  . COPD GOLD  0 08/31/2017  . Diabetic polyneuropathy associated with type 2 diabetes mellitus ([place]) 08/19/2017  . NSTEMI (non-ST elevated myocardial infarction) ([place]) 01/01/2017  . Chest pain 01/01/2017  . Tobacco abuse   . PVD (peripheral vascular disease) ([place])   . Proteinuria   . Obesity   . Normocytic anemia   . Hypertension   . High cholesterol   . Foot amputation status   . Diabetes mellitus with neuropathy ([place])   . CKD (chronic kidney disease), stage IV ([place])   . Chronic diastolic CHF  (congestive heart failure) ([place])   . CAD (coronary artery disease) 10/27/2016  . Chronic kidney disease 09/23/2016  . Acute congestive heart failure ([place])   . Hypertensive heart disease with heart failure ([place])   . Chronic combined systolic and diastolic heart failure ([place]) 09/14/2016  . Onychomycosis 12/24/2015  . Status post transmetatarsal amputation of foot, left ([place]) 09/06/2015  . Diabetic foot ulcer ([place]) 07/13/2015  . Cellulitis 07/12/2015  . HLD (hyperlipidemia) 09/02/2012  . Peripheral nerve disease 09/02/2012  . Type 2 diabetes mellitus with peripheral angiopathy ([place]) 09/02/2012  . Hypertriglyceridemia 07/28/2012  . Peripheral vascular disease ([place]) 07/28/2012  . Avitaminosis D 07/28/2012  . Essential (primary) hypertension 06/10/2012  . History of biliary T-tube placement 06/10/2012  . Leg pain 06/10/2012  . Current tobacco use 06/10/2012  . Atrophic vaginitis 06/07/2012  . Type 2 diabetes mellitus ([place]) 06/07/2012   Past Medical History:  Diagnosis Date  . Acute congestive heart failure ([place])   . Acute diastolic CHF (congestive heart failure) ([place]) 09/14/2016  . Atrophic vaginitis 06/07/2012  . Avitaminosis D 07/28/2012  . CAD (coronary artery disease)    a. NSTEM 09/2016: cath showing severe diffuse disease of the RCA, ramus and Cx with mild-mod disease of LAD; PCI would require multiple stents and significant contrast usage thus medical therapy recommended.  . Cellulitis 07/12/2015  . Chest pain 01/01/2017  . Chronic diastolic CHF (congestive heart failure) ([place])   . Chronic kidney disease 09/23/2016  . CKD (chronic kidney disease), stage IV ([place])   . Constipation   . COPD GOLD  0 08/31/2017   Quit smoking 07/17/17 - Spirometry 09/01/2017  FEV1 1.23 (58%)  Ratio 57 with mild curvature p no rx - 09/01/2017  After extensive coaching inhaler device,  effectiveness =    75% with elipta with cough provoked  - PFT's  10/14/2017  FEV1 1.69 (68 % ) ratio 84  p no % improvement from  saba p nothing prior to study with DLCO  50 % corrects to 76  % for alv volume   - 10/14/2017  Walked RA x 3 laps @ 1  . Current tobacco use 06/10/2012  . Diabetes mellitus with neuropathy ([place])   . Diabetic foot ulcer ([place]) 07/13/2015  . Diabetic polyneuropathy associated with type 2 diabetes mellitus ([place]) 08/19/2017  . DOE (dyspnea on exertion) 09/01/2017   09/01/2017  Walked RA x 3 laps @ 185 ft each stopped due to  End of study, nl to mod fast  pace, no  desat   But stopped to rest x 2  Spirometry 09/01/2017  FEV1 1.23 (58%)  Ratio 57  > trial of anoro    .  Dyspnea    with exertion  . ESRD (end stage renal disease) ([place])    MWF - [facility]  . Essential (primary) hypertension 06/10/2012  . Foot amputation status    a. h/o left foot transmetatarsal amputation  . Headache    Migraines  . High cholesterol   . History of biliary T-tube placement 06/10/2012   Patient unaware   . History of cardiac arrest   . HLD (hyperlipidemia) 09/02/2012   Overview:  ICD-10 cut over    . Hypertension    a. patent renal arteries by PV angio 08/2015. b. heavy proteinuria 09/2016 ? nephrotic.  [name] Hypertensive heart disease with heart failure ([place])   . Hypertriglyceridemia 07/28/2012  . Leg pain 06/10/2012  . Normocytic anemia    pt denies this dx  . NSTEMI (non-ST elevated myocardial infarction) ([place]) 01/01/2017  . Obesity   . Onychomycosis 12/24/2015  . Peripheral nerve disease 09/02/2012   legs  . Peripheral vascular disease ([place]) 07/28/2012  . Pneumonia 2018  . Proteinuria   . PVD (peripheral vascular disease) ([place])    a. status post left SFA and popliteal stent and left SFA PTA with drug coated balloon in 08/2015.  . Status post transmetatarsal amputation of foot, left ([place]) 09/06/2015  . Tobacco abuse    quit cigarettes 2019, smokes marijuana  . Type 2 diabetes mellitus ([place]) 06/07/2012  . Type 2 diabetes mellitus with peripheral angiopathy ([place]) 09/02/2012    Family History  Problem Relation Age of Onset    . Diabetes Mother   . Hypertension Father   . Colon cancer Neg Hx   . Stomach cancer Neg Hx   . Pancreatic cancer Neg Hx     Past Surgical History:  Procedure Laterality Date  . ABDOMINAL HYSTERECTOMY    . AMPUTATION Left 09/06/2015   Procedure: LEFT TRANSMETATARSAL AMPUTATTION;  Surgeon: Newt Minion, MD;  Location: [facility];  Service: Orthopedics;  Laterality: Left;  . AV FISTULA PLACEMENT Left 12/13/2018   Procedure: ARTERIOVENOUS (AV) FISTULA CREATION LEFT UPPER ARM;  Surgeon: Waynetta Sandy, MD;  Location: [facility];  Service: Vascular;  Laterality: Left;  . BASCILIC VEIN TRANSPOSITION Left 03/30/2019   Procedure: LEFT ARM ARTERIOVENOUS VEIN TRANSPOSITION;  Surgeon: Serafina Mitchell, MD;  [facility];  Service: Vascular;  Laterality: Left;  . COLONOSCOPY     polyps x 1  . LEFT HEART CATH AND CORONARY ANGIOGRAPHY N/A 09/17/2016   Procedure: LEFT HEART CATH AND CORONARY ANGIOGRAPHY;  Surgeon: Jettie Booze, MD;  Location: [facility];  Service: Cardiovascular;  Laterality: N/A;  . PERIPHERAL VASCULAR CATHETERIZATION N/A 08/16/2015   Procedure: Abdominal Aortogram;  Surgeon: Elam Dutch, MD;  Location: [facility];  Service: Cardiovascular;  Laterality: N/A;  . PERIPHERAL VASCULAR CATHETERIZATION Bilateral 08/16/2015   Procedure: Lower Extremity Angiography;  Surgeon: Elam Dutch, MD;  Location: [facility];  Service: Cardiovascular;  Laterality: Bilateral;  . PERIPHERAL VASCULAR CATHETERIZATION Left 08/16/2015   Procedure: Peripheral Vascular Intervention;  Surgeon: Elam Dutch, MD;  Location: [facility];  Service: Cardiovascular;  Laterality: Left;  SFA STENT X 2   Social History   Occupational History  . Not on file  Tobacco Use  . Smoking status: Former Smoker    Packs/day: 1.00    Years: 42.00    Pack years: 42.00    Types: Cigarettes    Quit date: 07/17/2017    Years since quitting: 2.1  .  Smokeless tobacco: Never Used   Vaping Use  . Vaping Use: Never used  Substance and Sexual Activity  . Alcohol use: Not Currently    Alcohol/week: 1.0 - 2.0 standard drink    Types: 1 - 2 Glasses of wine per week    Comment: on social occassions  . Drug use: Yes    Frequency: 7.0 times per week    Types: Marijuana    Comment: daily use - last use 03/28/19  . Sexual activity: Not on file    Comment: Hysterectomy

## 2019-09-14 ENCOUNTER — Other Ambulatory Visit: Payer: Self-pay

## 2019-09-14 ENCOUNTER — Telehealth: Payer: Self-pay

## 2019-09-14 DIAGNOSIS — I739 Peripheral vascular disease, unspecified: Secondary | ICD-10-CM

## 2019-09-14 NOTE — Telephone Encounter (Signed)
Pt called with c/o "excruciating pain in L foot" for the last few days. She went to ED and waited to be seen from 3p-10p yesterday and ended up leaving w/o being seen. She is s/p L transmetatarsal amputation (2017). She denies any numbness/coolness of the foot. She denies any injury to the foot. She is scheduled for ABI's and arterial duplex tomorrow and to f/u with APP next week. She has verbalized understanding of these appts.

## 2019-09-15 ENCOUNTER — Ambulatory Visit (HOSPITAL_COMMUNITY)
Admission: RE | Admit: 2019-09-15 | Discharge: 2019-09-15 | Disposition: A | Discharge status: Home / Self Care | Payer: Medicare Other | Source: Ambulatory Visit | Attending: Vascular Surgery | Admitting: Vascular Surgery

## 2019-09-15 ENCOUNTER — Ambulatory Visit (INDEPENDENT_AMBULATORY_CARE_PROVIDER_SITE_OTHER)
Admission: RE | Admit: 2019-09-15 | Discharge: 2019-09-15 | Disposition: A | Discharge status: Home / Self Care | Payer: Medicare Other | Source: Ambulatory Visit | Attending: Vascular Surgery | Admitting: Vascular Surgery

## 2019-09-15 ENCOUNTER — Other Ambulatory Visit: Payer: Self-pay

## 2019-09-15 ENCOUNTER — Encounter: Payer: Self-pay | Admitting: Vascular Surgery

## 2019-09-15 ENCOUNTER — Ambulatory Visit (INDEPENDENT_AMBULATORY_CARE_PROVIDER_SITE_OTHER): Payer: Medicare Other | Admitting: Vascular Surgery

## 2019-09-15 VITALS — BP 147/96 | Temp 97.9°F | Resp 20 | Ht 64.0 in | Wt 170.0 lb

## 2019-09-15 DIAGNOSIS — N186 End stage renal disease: Secondary | ICD-10-CM | POA: Diagnosis not present

## 2019-09-15 DIAGNOSIS — I739 Peripheral vascular disease, unspecified: Secondary | ICD-10-CM

## 2019-09-15 MED ORDER — OXYCODONE-ACETAMINOPHEN 5-325 MG PO TABS: 1.0000 | ORAL_TABLET | ORAL | 0 refills | Status: DC | PRN

## 2019-09-15 MED ORDER — SODIUM CHLORIDE 0.9 % IV SOLN: 250.0000 mL | INTRAVENOUS | Status: DC | PRN

## 2019-09-15 NOTE — Progress Notes (Signed)
Patient ID: Rebekah Johnson, female   DOB: Feb 07, 1960, 59 y.o.   MRN: [id number]  Reason for Consult: Follow-up   Referred by No ref. provider found  Subjective:     HPI:  Rebekah Johnson is a 59 y.o. female known to our office from previous dialysis access.  She more recently has a left transmetatarsal amputation that is healed.  She has persistent pain there.  She presents today for evaluation of ABIs.  She does have recurrent wound on the lateral aspect of her transmetatarsal amputation.  Currently walking a wheelchair due to left lower extremity pain.  Does not have any issues with the right lower extremity.  Past Medical History:  Diagnosis Date  . Acute congestive heart failure ([place])   . Acute diastolic CHF (congestive heart failure) ([place]) 09/14/2016  . Atrophic vaginitis 06/07/2012  . Avitaminosis D 07/28/2012  . CAD (coronary artery disease)    a. NSTEM 09/2016: cath showing severe diffuse disease of the RCA, ramus and Cx with mild-mod disease of LAD; PCI would require multiple stents and significant contrast usage thus medical therapy recommended.  . Cellulitis 07/12/2015  . Chest pain 01/01/2017  . Chronic diastolic CHF (congestive heart failure) ([place])   . Chronic kidney disease 09/23/2016  . CKD (chronic kidney disease), stage IV ([place])   . Constipation   . COPD GOLD  0 08/31/2017   Quit smoking 07/17/17 - Spirometry 09/01/2017  FEV1 1.23 (58%)  Ratio 57 with mild curvature p no rx - 09/01/2017  After extensive coaching inhaler device,  effectiveness =    75% with elipta with cough provoked  - PFT's  10/14/2017  FEV1 1.69 (68 % ) ratio 84  p no % improvement from saba p nothing prior to study with DLCO  50 % corrects to 76  % for alv volume   - 10/14/2017  Walked RA x 3 laps @ 1  . Current tobacco use 06/10/2012  . Diabetes mellitus with neuropathy ([place])   . Diabetic foot ulcer ([place]) 07/13/2015  . Diabetic polyneuropathy associated with type 2 diabetes mellitus ([place]) 08/19/2017  . DOE  (dyspnea on exertion) 09/01/2017   09/01/2017  Walked RA x 3 laps @ 185 ft each stopped due to  End of study, nl to mod fast  pace, no  desat   But stopped to rest x 2  Spirometry 09/01/2017  FEV1 1.23 (58%)  Ratio 57  > trial of anoro    . Dyspnea    with exertion  . ESRD (end stage renal disease) ([place])    MWF - [facility]  . Essential (primary) hypertension 06/10/2012  . Foot amputation status    a. h/o left foot transmetatarsal amputation  . Headache    Migraines  . High cholesterol   . History of biliary T-tube placement 06/10/2012   Patient unaware   . History of cardiac arrest   . HLD (hyperlipidemia) 09/02/2012   Overview:  ICD-10 cut over    . Hypertension    a. patent renal arteries by PV angio 08/2015. b. heavy proteinuria 09/2016 ? nephrotic.  [name] Hypertensive heart disease with heart failure ([place])   . Hypertriglyceridemia 07/28/2012  . Leg pain 06/10/2012  . Normocytic anemia    pt denies this dx  . NSTEMI (non-ST elevated myocardial infarction) ([place]) 01/01/2017  . Obesity   . Onychomycosis 12/24/2015  . Peripheral nerve disease 09/02/2012   legs  . Peripheral vascular disease ([place]) 07/28/2012  . Pneumonia 2018  .  Proteinuria   . PVD (peripheral vascular disease) ([place])    a. status post left SFA and popliteal stent and left SFA PTA with drug coated balloon in 08/2015.  . Status post transmetatarsal amputation of foot, left ([place]) 09/06/2015  . Tobacco abuse    quit cigarettes 2019, smokes marijuana  . Type 2 diabetes mellitus ([place]) 06/07/2012  . Type 2 diabetes mellitus with peripheral angiopathy ([place]) 09/02/2012   Family History  Problem Relation Age of Onset  . Diabetes Mother   . Hypertension Father   . Colon cancer Neg Hx   . Stomach cancer Neg Hx   . Pancreatic cancer Neg Hx    Past Surgical History:  Procedure Laterality Date  . ABDOMINAL HYSTERECTOMY    . AMPUTATION Left 09/06/2015   Procedure: LEFT TRANSMETATARSAL AMPUTATTION;  Surgeon: Newt Minion, MD;  Location: [facility];  Service: Orthopedics;  Laterality: Left;  . AV FISTULA PLACEMENT Left 12/13/2018   Procedure: ARTERIOVENOUS (AV) FISTULA CREATION LEFT UPPER ARM;  Surgeon: Waynetta Sandy, MD;  Location: [facility];  Service: Vascular;  Laterality: Left;  . BASCILIC VEIN TRANSPOSITION Left 03/30/2019   Procedure: LEFT ARM ARTERIOVENOUS VEIN TRANSPOSITION;  Surgeon: Serafina Mitchell, MD;  [facility];  Service: Vascular;  Laterality: Left;  . COLONOSCOPY     polyps x 1  . LEFT HEART CATH AND CORONARY ANGIOGRAPHY N/A 09/17/2016   Procedure: LEFT HEART CATH AND CORONARY ANGIOGRAPHY;  Surgeon: Jettie Booze, MD;  Location: [facility];  Service: Cardiovascular;  Laterality: N/A;  . PERIPHERAL VASCULAR CATHETERIZATION N/A 08/16/2015   Procedure: Abdominal Aortogram;  Surgeon: Elam Dutch, MD;  Location: [facility];  Service: Cardiovascular;  Laterality: N/A;  . PERIPHERAL VASCULAR CATHETERIZATION Bilateral 08/16/2015   Procedure: Lower Extremity Angiography;  Surgeon: Elam Dutch, MD;  Location: [facility];  Service: Cardiovascular;  Laterality: Bilateral;  . PERIPHERAL VASCULAR CATHETERIZATION Left 08/16/2015   Procedure: Peripheral Vascular Intervention;  Surgeon: Elam Dutch, MD;  Location: [facility];  Service: Cardiovascular;  Laterality: Left;  SFA STENT X 2    Short Social History:  Social History   Tobacco Use  . Smoking status: Former Smoker    Packs/day: 1.00    Years: 42.00    Pack years: 42.00    Types: Cigarettes    Quit date: 07/17/2017    Years since quitting: 2.1  . Smokeless tobacco: Never Used  Substance Use Topics  . Alcohol use: Not Currently    Alcohol/week: 1.0 - 2.0 standard drink    Types: 1 - 2 Glasses of wine per week    Comment: on social occassions    No Known Allergies  Current Outpatient Medications  Medication Sig Dispense Refill  . acetaminophen (TYLENOL) 500 MG tablet Take 1,000 mg by mouth every 6 (six)  hours as needed for moderate pain or headache.    [name] amLODipine (NORVASC) 5 MG tablet Take 1 tablet (5 mg total) by mouth daily. 30 tablet 11  . Ascorbic Acid (VITAMIN C) 1000 MG tablet Take 1,000 mg by mouth daily.    [name] aspirin 81 MG EC tablet Take 1 tablet (81 mg total) by mouth daily. Reported on 07/13/2015 90 tablet 0  . atorvastatin (LIPITOR) 80 MG tablet Take 1 tablet (80 mg total) by mouth daily. 90 tablet 2  . Blood Glucose Monitoring Suppl (ACCU-CHEK AVIVA) device Use as instructed three times daily. (Patient taking differently: Use as instructed  twice times daily.) 1 each 0  . carvedilol (COREG) 25 MG tablet Take 1 tablet (25 mg total) by mouth 2 (two) times daily. 180 tablet 2  . cholecalciferol (VITAMIN D3) 25 MCG (1000 UT) tablet Take 1,000 Units by mouth daily.    . clopidogrel (PLAVIX) 75 MG tablet Take 1 tablet (75 mg total) by mouth daily. 90 tablet 1  . docusate sodium (COLACE) 100 MG capsule Take 200 mg by mouth 2 (two) times daily.     [name] doxycycline (VIBRA-TABS) 100 MG tablet Take 1 tablet (100 mg total) by mouth daily. 14 tablet 0  . furosemide (LASIX) 80 MG tablet Take 80 mg by mouth 2 (two) times daily.    [name] gabapentin (NEURONTIN) 300 MG capsule Take 1 capsule (300 mg total) by mouth 3 (three) times daily. 90 capsule 3  . glipiZIDE (GLUCOTROL) 5 MG tablet Take 0.5 tablets (2.5 mg total) by mouth daily before breakfast. (Patient taking differently: Take 5 mg by mouth daily before breakfast. ) 30 tablet 3  . glucose blood (ACCU-CHEK AVIVA) test strip Use as instructed three times daily before meals. 100 each 12  . hydrALAZINE (APRESOLINE) 50 MG tablet Take 50 mg by mouth 3 (three) times daily.    [name] HYDROcodone-acetaminophen (NORCO/VICODIN) 5-325 MG tablet Take 1 tablet by mouth every 6 (six) hours as needed for moderate pain. 20 tablet 0  . isosorbide mononitrate (IMDUR) 30 MG 24 hr tablet Take 60 mg by mouth daily.    [name] lactulose (CHRONULAC) 10 GM/15ML solution Take 15 mLs (10 g  total) by mouth 3 (three) times daily. 236 mL 0  . Lancet Devices (ACCU-CHEK SOFTCLIX) lancets Use as instructed three times daily before meals. 1 each 5  . latanoprost (XALATAN) 0.005 % ophthalmic solution Place 1 drop into both eyes at bedtime.     . nitroGLYCERIN (NITRODUR - DOSED IN MG/24 HR) 0.2 mg/hr patch Place 1 patch (0.2 mg total) onto the skin daily. 30 patch 12  . Omega-3 Fatty Acids (FISH OIL) 1000 MG CAPS Take 1,000 mg by mouth daily.     . ranolazine (RANEXA) 500 MG 12 hr tablet Take 1 tablet by mouth twice daily 180 tablet 1  . sevelamer carbonate (RENVELA) 800 MG tablet Take 800 mg by mouth See admin instructions. Three times daily with meals and with snack.    . silver sulfADIAZINE (SILVADENE) 1 % cream Apply 1 application topically daily. Apply to affected area daily plus dry dressing (Patient taking differently: Apply 1 application topically daily as needed (wound care). ) 400 g 3  . sitaGLIPtin (JANUVIA) 25 MG tablet Take 1 tablet (25 mg total) by mouth daily. 30 tablet 3  . ZOFRAN 4 MG tablet Take 4 mg by mouth every 8 (eight) hours as needed for nausea/vomiting.    . nitroGLYCERIN (NITROSTAT) 0.4 MG SL tablet Place 1 tablet (0.4 mg total) under the tongue every 5 (five) minutes as needed for chest pain. 25 tablet 3   No current facility-administered medications for this visit.    Review of Systems  Constitutional:  Constitutional negative. HENT: HENT negative.  Eyes: Eyes negative.  Respiratory: Respiratory negative.  Cardiovascular: Cardiovascular negative.  GI: Gastrointestinal negative.  Musculoskeletal: Positive for leg pain.  Neurological: Neurological negative. Hematologic: Hematologic/lymphatic negative.  Psychiatric: Psychiatric negative.        Objective:  Objective   Vitals:   09/15/19 1604  BP: (!) 147/96  Resp: 20  Temp: 97.9 F (36.6 C)  Weight: 170  lb (77.1 kg)  Height: 5\' 4"  (1.626 m)   Body mass index is 29.18 kg/m.  Physical  Exam HENT:     Head: Normocephalic.     Nose:     Comments: Wearing a mask Eyes:     Pupils: Pupils are equal, round, and reactive to light.  Cardiovascular:     Pulses:          Femoral pulses are 2+ on the right side and 2+ on the left side. Pulmonary:     Effort: Pulmonary effort is normal.  Abdominal:     General: Abdomen is flat.     Palpations: Abdomen is soft.  Musculoskeletal:     Comments:  healed left transmetatarsal amputation with lateral ulceration  Skin:    Comments: ruborous left foot  Neurological:     Mental Status: She is alert.  Psychiatric:        Mood and Affect: Mood normal.        Behavior: Behavior normal.        Thought Content: Thought content normal.        Judgment: Judgment normal.     Data: I have independently interpreted her ABIs to be 0.43 on the right monophasic with toe pressure 25 on the left 0.2 monophasic with toe pressure of 38     Assessment/Plan:     59 year old female with end-stage renal disease status post left transmetatarsal amputation now with ulcer on the lateral aspect.  ABIs are severely decreased.  She has critical limb ischemia.  We will plan for aortogram bilateral lower extremity runoff possible intervention on the left on a nondialysis day in the near future.     Waynetta Sandy MD [facility]

## 2019-09-15 NOTE — H&P (View-Only) (Signed)
Patient ID: Rebekah Johnson, female   DOB: 02/08/1960, 59 y.o.   MRN: [id number]  Reason for Consult: Follow-up   Referred by No ref. provider found  Subjective:     HPI:  Rebekah Johnson is a 59 y.o. female known to our office from previous dialysis access.  She more recently has a left transmetatarsal amputation that is healed.  She has persistent pain there.  She presents today for evaluation of ABIs.  She does have recurrent wound on the lateral aspect of her transmetatarsal amputation.  Currently walking a wheelchair due to left lower extremity pain.  Does not have any issues with the right lower extremity.  Past Medical History:  Diagnosis Date  . Acute congestive heart failure ([place])   . Acute diastolic CHF (congestive heart failure) ([place]) 09/14/2016  . Atrophic vaginitis 06/07/2012  . Avitaminosis D 07/28/2012  . CAD (coronary artery disease)    a. NSTEM 09/2016: cath showing severe diffuse disease of the RCA, ramus and Cx with mild-mod disease of LAD; PCI would require multiple stents and significant contrast usage thus medical therapy recommended.  . Cellulitis 07/12/2015  . Chest pain 01/01/2017  . Chronic diastolic CHF (congestive heart failure) ([place])   . Chronic kidney disease 09/23/2016  . CKD (chronic kidney disease), stage IV ([place])   . Constipation   . COPD GOLD  0 08/31/2017   Quit smoking 07/17/17 - Spirometry 09/01/2017  FEV1 1.23 (58%)  Ratio 57 with mild curvature p no rx - 09/01/2017  After extensive coaching inhaler device,  effectiveness =    75% with elipta with cough provoked  - PFT's  10/14/2017  FEV1 1.69 (68 % ) ratio 84  p no % improvement from saba p nothing prior to study with DLCO  50 % corrects to 76  % for alv volume   - 10/14/2017  Walked RA x 3 laps @ 1  . Current tobacco use 06/10/2012  . Diabetes mellitus with neuropathy ([place])   . Diabetic foot ulcer ([place]) 07/13/2015  . Diabetic polyneuropathy associated with type 2 diabetes mellitus ([place]) 08/19/2017  . DOE  (dyspnea on exertion) 09/01/2017   09/01/2017  Walked RA x 3 laps @ 185 ft each stopped due to  End of study, nl to mod fast  pace, no  desat   But stopped to rest x 2  Spirometry 09/01/2017  FEV1 1.23 (58%)  Ratio 57  > trial of anoro    . Dyspnea    with exertion  . ESRD (end stage renal disease) ([place])    MWF - [facility]  . Essential (primary) hypertension 06/10/2012  . Foot amputation status    a. h/o left foot transmetatarsal amputation  . Headache    Migraines  . High cholesterol   . History of biliary T-tube placement 06/10/2012   Patient unaware   . History of cardiac arrest   . HLD (hyperlipidemia) 09/02/2012   Overview:  ICD-10 cut over    . Hypertension    a. patent renal arteries by PV angio 08/2015. b. heavy proteinuria 09/2016 ? nephrotic.  [name] Hypertensive heart disease with heart failure ([place])   . Hypertriglyceridemia 07/28/2012  . Leg pain 06/10/2012  . Normocytic anemia    pt denies this dx  . NSTEMI (non-ST elevated myocardial infarction) ([place]) 01/01/2017  . Obesity   . Onychomycosis 12/24/2015  . Peripheral nerve disease 09/02/2012   legs  . Peripheral vascular disease ([place]) 07/28/2012  . Pneumonia 2018  .  Proteinuria   . PVD (peripheral vascular disease) ([place])    a. status post left SFA and popliteal stent and left SFA PTA with drug coated balloon in 08/2015.  . Status post transmetatarsal amputation of foot, left ([place]) 09/06/2015  . Tobacco abuse    quit cigarettes 2019, smokes marijuana  . Type 2 diabetes mellitus ([name]) 06/07/2012  . Type 2 diabetes mellitus with peripheral angiopathy ([place]) 09/02/2012   Family History  Problem Relation Age of Onset  . Diabetes Mother   . Hypertension Father   . Colon cancer Neg Hx   . Stomach cancer Neg Hx   . Pancreatic cancer Neg Hx    Past Surgical History:  Procedure Laterality Date  . ABDOMINAL HYSTERECTOMY    . AMPUTATION Left 09/06/2015   Procedure: LEFT TRANSMETATARSAL AMPUTATTION;  Surgeon: Newt Minion, MD;  Location: [facility];  Service: Orthopedics;  Laterality: Left;  . AV FISTULA PLACEMENT Left 12/13/2018   Procedure: ARTERIOVENOUS (AV) FISTULA CREATION LEFT UPPER ARM;  Surgeon: Waynetta Sandy, MD;  Location: [facility];  Service: Vascular;  Laterality: Left;  . BASCILIC VEIN TRANSPOSITION Left 03/30/2019   Procedure: LEFT ARM ARTERIOVENOUS VEIN TRANSPOSITION;  Surgeon: Serafina Mitchell, MD;  [facility];  Service: Vascular;  Laterality: Left;  . COLONOSCOPY     polyps x 1  . LEFT HEART CATH AND CORONARY ANGIOGRAPHY N/A 09/17/2016   Procedure: LEFT HEART CATH AND CORONARY ANGIOGRAPHY;  Surgeon: Jettie Booze, MD;  Location: [facility];  Service: Cardiovascular;  Laterality: N/A;  . PERIPHERAL VASCULAR CATHETERIZATION N/A 08/16/2015   Procedure: Abdominal Aortogram;  Surgeon: Elam Dutch, MD;  Location: [facility];  Service: Cardiovascular;  Laterality: N/A;  . PERIPHERAL VASCULAR CATHETERIZATION Bilateral 08/16/2015   Procedure: Lower Extremity Angiography;  Surgeon: Elam Dutch, MD;  Location: [facility];  Service: Cardiovascular;  Laterality: Bilateral;  . PERIPHERAL VASCULAR CATHETERIZATION Left 08/16/2015   Procedure: Peripheral Vascular Intervention;  Surgeon: Elam Dutch, MD;  Location: [facility];  Service: Cardiovascular;  Laterality: Left;  SFA STENT X 2    Short Social History:  Social History   Tobacco Use  . Smoking status: Former Smoker    Packs/day: 1.00    Years: 42.00    Pack years: 42.00    Types: Cigarettes    Quit date: 07/17/2017    Years since quitting: 2.1  . Smokeless tobacco: Never Used  Substance Use Topics  . Alcohol use: Not Currently    Alcohol/week: 1.0 - 2.0 standard drink    Types: 1 - 2 Glasses of wine per week    Comment: on social occassions    No Known Allergies  Current Outpatient Medications  Medication Sig Dispense Refill  . acetaminophen (TYLENOL) 500 MG tablet Take 1,000 mg by mouth every 6 (six)  hours as needed for moderate pain or headache.    [name] amLODipine (NORVASC) 5 MG tablet Take 1 tablet (5 mg total) by mouth daily. 30 tablet 11  . Ascorbic Acid (VITAMIN C) 1000 MG tablet Take 1,000 mg by mouth daily.    [name] aspirin 81 MG EC tablet Take 1 tablet (81 mg total) by mouth daily. Reported on 07/13/2015 90 tablet 0  . atorvastatin (LIPITOR) 80 MG tablet Take 1 tablet (80 mg total) by mouth daily. 90 tablet 2  . Blood Glucose Monitoring Suppl (ACCU-CHEK AVIVA) device Use as instructed three times daily. (Patient taking differently: Use as instructed  twice times daily.) 1 each 0  . carvedilol (COREG) 25 MG tablet Take 1 tablet (25 mg total) by mouth 2 (two) times daily. 180 tablet 2  . cholecalciferol (VITAMIN D3) 25 MCG (1000 UT) tablet Take 1,000 Units by mouth daily.    . clopidogrel (PLAVIX) 75 MG tablet Take 1 tablet (75 mg total) by mouth daily. 90 tablet 1  . docusate sodium (COLACE) 100 MG capsule Take 200 mg by mouth 2 (two) times daily.     [name] doxycycline (VIBRA-TABS) 100 MG tablet Take 1 tablet (100 mg total) by mouth daily. 14 tablet 0  . furosemide (LASIX) 80 MG tablet Take 80 mg by mouth 2 (two) times daily.    [name] gabapentin (NEURONTIN) 300 MG capsule Take 1 capsule (300 mg total) by mouth 3 (three) times daily. 90 capsule 3  . glipiZIDE (GLUCOTROL) 5 MG tablet Take 0.5 tablets (2.5 mg total) by mouth daily before breakfast. (Patient taking differently: Take 5 mg by mouth daily before breakfast. ) 30 tablet 3  . glucose blood (ACCU-CHEK AVIVA) test strip Use as instructed three times daily before meals. 100 each 12  . hydrALAZINE (APRESOLINE) 50 MG tablet Take 50 mg by mouth 3 (three) times daily.    [name] HYDROcodone-acetaminophen (NORCO/VICODIN) 5-325 MG tablet Take 1 tablet by mouth every 6 (six) hours as needed for moderate pain. 20 tablet 0  . isosorbide mononitrate (IMDUR) 30 MG 24 hr tablet Take 60 mg by mouth daily.    [name] lactulose (CHRONULAC) 10 GM/15ML solution Take 15 mLs (10 g  total) by mouth 3 (three) times daily. 236 mL 0  . Lancet Devices (ACCU-CHEK SOFTCLIX) lancets Use as instructed three times daily before meals. 1 each 5  . latanoprost (XALATAN) 0.005 % ophthalmic solution Place 1 drop into both eyes at bedtime.     . nitroGLYCERIN (NITRODUR - DOSED IN MG/24 HR) 0.2 mg/hr patch Place 1 patch (0.2 mg total) onto the skin daily. 30 patch 12  . Omega-3 Fatty Acids (FISH OIL) 1000 MG CAPS Take 1,000 mg by mouth daily.     . ranolazine (RANEXA) 500 MG 12 hr tablet Take 1 tablet by mouth twice daily 180 tablet 1  . sevelamer carbonate (RENVELA) 800 MG tablet Take 800 mg by mouth See admin instructions. Three times daily with meals and with snack.    . silver sulfADIAZINE (SILVADENE) 1 % cream Apply 1 application topically daily. Apply to affected area daily plus dry dressing (Patient taking differently: Apply 1 application topically daily as needed (wound care). ) 400 g 3  . sitaGLIPtin (JANUVIA) 25 MG tablet Take 1 tablet (25 mg total) by mouth daily. 30 tablet 3  . ZOFRAN 4 MG tablet Take 4 mg by mouth every 8 (eight) hours as needed for nausea/vomiting.    . nitroGLYCERIN (NITROSTAT) 0.4 MG SL tablet Place 1 tablet (0.4 mg total) under the tongue every 5 (five) minutes as needed for chest pain. 25 tablet 3   No current facility-administered medications for this visit.    Review of Systems  Constitutional:  Constitutional negative. HENT: HENT negative.  Eyes: Eyes negative.  Respiratory: Respiratory negative.  Cardiovascular: Cardiovascular negative.  GI: Gastrointestinal negative.  Musculoskeletal: Positive for leg pain.  Neurological: Neurological negative. Hematologic: Hematologic/lymphatic negative.  Psychiatric: Psychiatric negative.        Objective:  Objective   Vitals:   09/15/19 1604  BP: (!) 147/96  Resp: 20  Temp: 97.9 F (36.6 C)  Weight: 170  lb (77.1 kg)  Height: 5\' 4"  (1.626 m)   Body mass index is 29.18 kg/m.  Physical  Exam HENT:     Head: Normocephalic.     Nose:     Comments: Wearing a mask Eyes:     Pupils: Pupils are equal, round, and reactive to light.  Cardiovascular:     Pulses:          Femoral pulses are 2+ on the right side and 2+ on the left side. Pulmonary:     Effort: Pulmonary effort is normal.  Abdominal:     General: Abdomen is flat.     Palpations: Abdomen is soft.  Musculoskeletal:     Comments:  healed left transmetatarsal amputation with lateral ulceration  Skin:    Comments: ruborous left foot  Neurological:     Mental Status: She is alert.  Psychiatric:        Mood and Affect: Mood normal.        Behavior: Behavior normal.        Thought Content: Thought content normal.        Judgment: Judgment normal.     Data: I have independently interpreted her ABIs to be 0.43 on the right monophasic with toe pressure 25 on the left 0.2 monophasic with toe pressure of 38     Assessment/Plan:     59 year old female with end-stage renal disease status post left transmetatarsal amputation now with ulcer on the lateral aspect.  ABIs are severely decreased.  She has critical limb ischemia.  We will plan for aortogram bilateral lower extremity runoff possible intervention on the left on a nondialysis day in the near future.     Waynetta Sandy MD [facility]

## 2019-09-20 ENCOUNTER — Ambulatory Visit: Payer: Medicare Other | Admitting: Vascular Surgery

## 2019-09-21 ENCOUNTER — Other Ambulatory Visit (HOSPITAL_COMMUNITY): Payer: Medicare Other

## 2019-09-23 ENCOUNTER — Other Ambulatory Visit (HOSPITAL_COMMUNITY)
Admission: RE | Admit: 2019-09-23 | Discharge: 2019-09-23 | Disposition: A | Discharge status: Home / Self Care | Payer: Medicare Other | Source: Ambulatory Visit | Attending: Vascular Surgery | Admitting: Vascular Surgery

## 2019-09-23 DIAGNOSIS — Z01812 Encounter for preprocedural laboratory examination: Secondary | ICD-10-CM | POA: Insufficient documentation

## 2019-09-23 DIAGNOSIS — Z20822 Contact with and (suspected) exposure to covid-19: Secondary | ICD-10-CM | POA: Insufficient documentation

## 2019-09-23 LAB — SARS CORONAVIRUS 2 (TAT 6-24 HRS): SARS Coronavirus 2: NEGATIVE

## 2019-09-25 ENCOUNTER — Other Ambulatory Visit: Payer: Self-pay

## 2019-09-25 ENCOUNTER — Ambulatory Visit (HOSPITAL_COMMUNITY)
Admission: RE | Admit: 2019-09-25 | Discharge: 2019-09-25 | Disposition: A | Discharge status: Home / Self Care | Payer: Medicare Other | Attending: Vascular Surgery | Admitting: Vascular Surgery

## 2019-09-25 ENCOUNTER — Encounter (HOSPITAL_COMMUNITY)
Admission: RE | Disposition: A | Discharge status: Home / Self Care | Payer: Self-pay | Source: Home / Self Care | Attending: Vascular Surgery

## 2019-09-25 DIAGNOSIS — Z79899 Other long term (current) drug therapy: Secondary | ICD-10-CM | POA: Insufficient documentation

## 2019-09-25 DIAGNOSIS — I252 Old myocardial infarction: Secondary | ICD-10-CM | POA: Diagnosis not present

## 2019-09-25 DIAGNOSIS — L97529 Non-pressure chronic ulcer of other part of left foot with unspecified severity: Secondary | ICD-10-CM | POA: Diagnosis not present

## 2019-09-25 DIAGNOSIS — I70245 Atherosclerosis of native arteries of left leg with ulceration of other part of foot: Secondary | ICD-10-CM | POA: Diagnosis not present

## 2019-09-25 DIAGNOSIS — J449 Chronic obstructive pulmonary disease, unspecified: Secondary | ICD-10-CM | POA: Insufficient documentation

## 2019-09-25 DIAGNOSIS — Z992 Dependence on renal dialysis: Secondary | ICD-10-CM | POA: Diagnosis not present

## 2019-09-25 DIAGNOSIS — Z7984 Long term (current) use of oral hypoglycemic drugs: Secondary | ICD-10-CM | POA: Diagnosis not present

## 2019-09-25 DIAGNOSIS — I132 Hypertensive heart and chronic kidney disease with heart failure and with stage 5 chronic kidney disease, or end stage renal disease: Secondary | ICD-10-CM | POA: Diagnosis not present

## 2019-09-25 DIAGNOSIS — Z6827 Body mass index (BMI) 27.0-27.9, adult: Secondary | ICD-10-CM | POA: Insufficient documentation

## 2019-09-25 DIAGNOSIS — E1122 Type 2 diabetes mellitus with diabetic chronic kidney disease: Secondary | ICD-10-CM | POA: Diagnosis not present

## 2019-09-25 DIAGNOSIS — E1140 Type 2 diabetes mellitus with diabetic neuropathy, unspecified: Secondary | ICD-10-CM | POA: Insufficient documentation

## 2019-09-25 DIAGNOSIS — E669 Obesity, unspecified: Secondary | ICD-10-CM | POA: Diagnosis not present

## 2019-09-25 DIAGNOSIS — I5032 Chronic diastolic (congestive) heart failure: Secondary | ICD-10-CM | POA: Diagnosis not present

## 2019-09-25 DIAGNOSIS — Z955 Presence of coronary angioplasty implant and graft: Secondary | ICD-10-CM | POA: Insufficient documentation

## 2019-09-25 DIAGNOSIS — R0609 Other forms of dyspnea: Secondary | ICD-10-CM | POA: Diagnosis not present

## 2019-09-25 DIAGNOSIS — I70244 Atherosclerosis of native arteries of left leg with ulceration of heel and midfoot: Secondary | ICD-10-CM | POA: Diagnosis not present

## 2019-09-25 DIAGNOSIS — N186 End stage renal disease: Secondary | ICD-10-CM | POA: Insufficient documentation

## 2019-09-25 DIAGNOSIS — E785 Hyperlipidemia, unspecified: Secondary | ICD-10-CM | POA: Insufficient documentation

## 2019-09-25 DIAGNOSIS — I2510 Atherosclerotic heart disease of native coronary artery without angina pectoris: Secondary | ICD-10-CM | POA: Diagnosis not present

## 2019-09-25 DIAGNOSIS — Z87891 Personal history of nicotine dependence: Secondary | ICD-10-CM | POA: Diagnosis not present

## 2019-09-25 DIAGNOSIS — E7800 Pure hypercholesterolemia, unspecified: Secondary | ICD-10-CM | POA: Diagnosis not present

## 2019-09-25 DIAGNOSIS — E11621 Type 2 diabetes mellitus with foot ulcer: Secondary | ICD-10-CM | POA: Diagnosis not present

## 2019-09-25 DIAGNOSIS — E1151 Type 2 diabetes mellitus with diabetic peripheral angiopathy without gangrene: Secondary | ICD-10-CM | POA: Diagnosis not present

## 2019-09-25 DIAGNOSIS — Z7902 Long term (current) use of antithrombotics/antiplatelets: Secondary | ICD-10-CM | POA: Diagnosis not present

## 2019-09-25 DIAGNOSIS — Z7982 Long term (current) use of aspirin: Secondary | ICD-10-CM | POA: Diagnosis not present

## 2019-09-25 HISTORY — PX: PERIPHERAL VASCULAR ATHERECTOMY: CATH118256

## 2019-09-25 HISTORY — PX: PERIPHERAL VASCULAR INTERVENTION: CATH118257

## 2019-09-25 HISTORY — PX: ABDOMINAL AORTOGRAM W/LOWER EXTREMITY: CATH118223

## 2019-09-25 LAB — POCT ACTIVATED CLOTTING TIME: Activated Clotting Time: 246 seconds

## 2019-09-25 SURGERY — ABDOMINAL AORTOGRAM W/LOWER EXTREMITY
Anesthesia: LOCAL | Laterality: Left

## 2019-09-25 MED ORDER — FENTANYL CITRATE (PF) 100 MCG/2ML IJ SOLN
INTRAMUSCULAR | Status: DC | PRN
Start: 2019-09-25 — End: 2019-09-25
  Administered 2019-09-25 (×2): 25 ug via INTRAVENOUS
  Administered 2019-09-25: 50 ug via INTRAVENOUS

## 2019-09-25 MED ORDER — ONDANSETRON HCL 4 MG/2ML IJ SOLN: 4.0000 mg | Freq: Four times a day (QID) | INTRAMUSCULAR | Status: DC | PRN

## 2019-09-25 MED ORDER — FENTANYL CITRATE (PF) 100 MCG/2ML IJ SOLN
INTRAMUSCULAR | Status: AC
  Filled 2019-09-25: qty 2

## 2019-09-25 MED ORDER — SODIUM CHLORIDE 0.9% FLUSH: 3.0000 mL | INTRAVENOUS | Status: DC | PRN

## 2019-09-25 MED ORDER — HEPARIN SODIUM (PORCINE) 1000 UNIT/ML IJ SOLN
INTRAMUSCULAR | Status: DC | PRN
  Administered 2019-09-25: 8000 [IU] via INTRAVENOUS

## 2019-09-25 MED ORDER — SODIUM CHLORIDE 0.9% FLUSH: 3.0000 mL | Freq: Two times a day (BID) | INTRAVENOUS | Status: DC

## 2019-09-25 MED ORDER — IODIXANOL 320 MG/ML IV SOLN
INTRAVENOUS | Status: DC | PRN
  Administered 2019-09-25: 160 mL

## 2019-09-25 MED ORDER — LIDOCAINE HCL (PF) 1 % IJ SOLN
INTRAMUSCULAR | Status: AC
  Filled 2019-09-25: qty 30

## 2019-09-25 MED ORDER — LABETALOL HCL 5 MG/ML IV SOLN: 10.0000 mg | INTRAVENOUS | Status: DC | PRN

## 2019-09-25 MED ORDER — MIDAZOLAM HCL 2 MG/2ML IJ SOLN
INTRAMUSCULAR | Status: DC | PRN
  Administered 2019-09-25: 1 mg via INTRAVENOUS
  Administered 2019-09-25: 2 mg via INTRAVENOUS
  Administered 2019-09-25: 1 mg via INTRAVENOUS

## 2019-09-25 MED ORDER — HEPARIN (PORCINE) IN NACL 1000-0.9 UT/500ML-% IV SOLN
INTRAVENOUS | Status: DC | PRN
  Administered 2019-09-25 (×2): 500 mL

## 2019-09-25 MED ORDER — HEPARIN (PORCINE) IN NACL 1000-0.9 UT/500ML-% IV SOLN
INTRAVENOUS | Status: AC
  Filled 2019-09-25: qty 1000

## 2019-09-25 MED ORDER — MIDAZOLAM HCL 2 MG/2ML IJ SOLN
INTRAMUSCULAR | Status: AC
  Filled 2019-09-25: qty 2

## 2019-09-25 MED ORDER — OXYCODONE HCL 5 MG PO TABS: 5.0000 mg | ORAL_TABLET | ORAL | Status: DC | PRN

## 2019-09-25 MED ORDER — HYDROMORPHONE HCL 1 MG/ML IJ SOLN: 0.5000 mg | INTRAMUSCULAR | Status: DC | PRN

## 2019-09-25 MED ORDER — ACETAMINOPHEN 325 MG PO TABS: 650.0000 mg | ORAL_TABLET | ORAL | Status: DC | PRN

## 2019-09-25 MED ORDER — LIDOCAINE HCL (PF) 1 % IJ SOLN
INTRAMUSCULAR | Status: DC | PRN
  Administered 2019-09-25: 18 mL

## 2019-09-25 MED ORDER — HYDRALAZINE HCL 20 MG/ML IJ SOLN: 5.0000 mg | INTRAMUSCULAR | Status: DC | PRN

## 2019-09-25 MED ORDER — SODIUM CHLORIDE 0.9 % IV SOLN: 250.0000 mL | INTRAVENOUS | Status: DC | PRN

## 2019-09-25 MED ORDER — MORPHINE SULFATE (PF) 2 MG/ML IV SOLN
2.0000 mg | Freq: Once | INTRAVENOUS | Status: AC
  Administered 2019-09-25: 2 mg via INTRAVENOUS
  Filled 2019-09-25: qty 1

## 2019-09-25 MED ORDER — HEPARIN SODIUM (PORCINE) 1000 UNIT/ML IJ SOLN
INTRAMUSCULAR | Status: AC
  Filled 2019-09-25: qty 1

## 2019-09-25 SURGICAL SUPPLY — 26 items
BALLN MUSTANG 5X60X135 (BALLOONS) ×2
BALLN STERLING OTW 5X220X150 (BALLOONS) ×2
BALLOON MUSTANG 5X60X135 (BALLOONS) ×1 IMPLANT
BALLOON STERLING OTW 5X220X150 (BALLOONS) ×1 IMPLANT
CATH AURYON 6FR ATHEREC 2.0 (CATHETERS) ×2 IMPLANT
CATH OMNI FLUSH 5F 65CM (CATHETERS) ×2 IMPLANT
CATH QUICKCROSS .035X135CM (MICROCATHETER) ×2 IMPLANT
CLOSURE MYNX CONTROL 6F/7F (Vascular Products) ×2 IMPLANT
DCB RANGER 5.0X200 150 (BALLOONS) ×1 IMPLANT
DCB RANGER 6.0X200 150 (BALLOONS) ×1 IMPLANT
GLIDEWIRE ADV .035X260CM (WIRE) ×2 IMPLANT
KIT ENCORE 26 ADVANTAGE (KITS) ×2 IMPLANT
KIT MICROPUNCTURE NIT STIFF (SHEATH) ×2 IMPLANT
KIT PV (KITS) ×2 IMPLANT
RANGER DCB 5.0X200 150 (BALLOONS) ×2
RANGER DCB 6.0X200 150 (BALLOONS) ×2
SHEATH PINNACLE 5F 10CM (SHEATH) ×2 IMPLANT
SHEATH PINNACLE 6F 10CM (SHEATH) ×2 IMPLANT
SHEATH PINNACLE ST 6F 45CM (SHEATH) ×2 IMPLANT
SHEATH PROBE COVER 6X72 (BAG) ×2 IMPLANT
STENT TIGRIS 5X60X120 (Permanent Stent) ×2 IMPLANT
SYR MEDRAD MARK V 150ML (SYRINGE) ×2 IMPLANT
TRANSDUCER W/STOPCOCK (MISCELLANEOUS) ×2 IMPLANT
TRAY PV CATH (CUSTOM PROCEDURE TRAY) ×2 IMPLANT
WIRE SPARTACORE .014X300CM (WIRE) ×2 IMPLANT
WIRE STARTER BENTSON 035X150 (WIRE) ×2 IMPLANT

## 2019-09-25 NOTE — Interval H&P Note (Signed)
History and Physical Interval Note:  09/25/2019 9:25 AM  Rebekah Johnson  has presented today for surgery, with the diagnosis of PAD.  The various methods of treatment have been discussed with the patient and family. After consideration of risks, benefits and other options for treatment, the patient has consented to  Procedure(s): ABDOMINAL AORTOGRAM W/LOWER EXTREMITY (Left) as a surgical intervention.  The patient's history has been reviewed, patient examined, no change in status, stable for surgery.  I have reviewed the patient's chart and labs.  Questions were answered to the patient's satisfaction.     Servando Snare

## 2019-09-25 NOTE — Op Note (Signed)
Patient name: Rebekah Johnson MRN: [id number] DOB: 1960-04-02 Sex: female  09/25/2019 Pre-operative Diagnosis: Critical left lower extremity with wound Post-operative diagnosis:  Same Surgeon:  Erlene Quan C. Donzetta Matters, MD Procedure Performed: 1.  Ultrasound-guided cannulation right common femoral artery 2.  Aortogram with bilateral lower extremity runoff 3.  Laser atherectomy left SFA popliteal arteries 4.  Drug-coated balloon angioplasty left SFA and popliteal arteries with 5 mm distal and 6 mm proximal Ranger 5.  Stent of distal popliteal artery with 5 x 60 mm Gore Tigris 6.  Moderate sedation with fentanyl and Versed 404 minutes 7.  Minx device closure right common femoral artery   Indications: 59 year old female with previous left SFA stenting.  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  She has been placed on antibiotics.  She is followed by Dr. Sharol Given.  She is now indicated for angiography possible invention.  Findings: Aorta and iliac segments are free of flow-limiting stenosis.  The right lower extremity has subtotal occlusion multiple areas of greater than 50% of the SFA and the posterior tibial artery is the dominant runoff to the foot.  Left lower extremity is the site of interest.  The proximal SFA has approximately 80% stenosis and after intervention this is reduced to 0% with no stenosis or dissection.  The entirety level of the previously placed stents have at least 50% stenosis and distally there occluded for approximately 60 mm.  After laser atherectomy drug-coated balloon angioplasty the majority of the stents have no residual stenosis or dissection.  Distally we still had 1 area this was stented primarily with 5 x 60 mm Tigris there was no residual stenosis there.  Dominant runoff is to the posterior tibial artery fills the foot there is no stenosis in that vessel.  The anterior tibial artery is occluded does reconstitute distally.   Peroneal artery is not identified.   Procedure:  The patient was identified in the holding area and taken to room 8.  The patient was then placed supine on the table and prepped and draped in the usual sterile fashion.  A time out was called.  Ultrasound was used to evaluate the right common femoral artery was noted to be heavily calcified.  The area was anesthetized with 1% lidocaine cannulated with direct ultrasound visualization a micropuncture needle followed the wire and sheath.  And images saved the permanent record.  We placed a Bentson wire followed by 5 French sheath to the level of L1 performed aortogram followed by lower extremity runoff.  With the above findings we then crossed the bifurcation.  We placed a long 6 French sheath into the left common femoral artery.  Patient was fully heparinized.  We Glidewire advantage quick cross catheter to get all the way to the level of posterior tibial artery and confirmed intraluminal access.  We then began with laser atherectomy of the proximal SFA and all the way through the in-stent stenoses in the distal occluded segment.  This did have aspiration attached to it.  Ultimately we lost about 200 cc of blood.  We then began with balloon angioplasty of the entire to the SFA and popliteal segment with 5 mm balloon.  Completion demonstrated some stenosis distally.  We did use drug-coated balloon angioplasty of the entire segment with a 5 distal and 6 proximal for total of approximately 400 cm.  We still had stenosis distally we elected to stent this primarily postdilated with 5 mm balloon.  At completion with  no residual stenosis or dissection.  There was brisk flow to the posterior tibial this was confirmed with Doppler.  Satisfied we exchanged for short 6 [place] sheath deployed a minx device.  She tolerated procedure without any complication.  EBL: 200 cc  Contrast: 140 cc     Randee Huston C. Donzetta Matters, MD [facility] Office:  [phone] Pager: [phone]

## 2019-09-25 NOTE — Discharge Instructions (Signed)

## 2019-09-26 ENCOUNTER — Encounter (HOSPITAL_COMMUNITY): Payer: Self-pay | Admitting: Vascular Surgery

## 2019-09-26 ENCOUNTER — Other Ambulatory Visit: Payer: Self-pay | Admitting: Oncology

## 2019-09-26 LAB — POCT I-STAT, CHEM 8
BUN: 78 mg/dL — ABNORMAL HIGH (ref 8–23)
Calcium, Ion: 1.03 mmol/L — ABNORMAL LOW (ref 1.15–1.40)
Chloride: 88 mmol/L — ABNORMAL LOW (ref 98–111)
Creatinine, Ser: 8.5 mg/dL — ABNORMAL HIGH (ref 0.44–1.00)
Glucose, Bld: 200 mg/dL — ABNORMAL HIGH (ref 70–99)
HCT: 41 % (ref 36.0–46.0)
Hemoglobin: 13.9 g/dL (ref 12.0–15.0)
Potassium: 3.4 mmol/L — ABNORMAL LOW (ref 3.5–5.1)
Sodium: 132 mmol/L — ABNORMAL LOW (ref 135–145)
TCO2: 34 mmol/L — ABNORMAL HIGH (ref 22–32)

## 2019-09-27 ENCOUNTER — Telehealth: Payer: Self-pay | Admitting: *Deleted

## 2019-09-27 NOTE — Telephone Encounter (Signed)
Pt called wanting refill of "infection medicine". I can't find record of [place] prescribing any antibiotics. Will discuss with Dr. Donzetta Matters and call back

## 2019-09-28 ENCOUNTER — Telehealth: Payer: Self-pay | Admitting: *Deleted

## 2019-09-28 ENCOUNTER — Other Ambulatory Visit: Payer: Self-pay | Admitting: *Deleted

## 2019-09-28 DIAGNOSIS — I739 Peripheral vascular disease, unspecified: Secondary | ICD-10-CM

## 2019-09-28 MED ORDER — DOXYCYCLINE HYCLATE 100 MG PO TABS: 100.0000 mg | ORAL_TABLET | Freq: Every day | ORAL | 0 refills | Status: DC

## 2019-09-28 NOTE — Telephone Encounter (Signed)
Reordered doxycycline per Donzetta Matters MD

## 2019-10-03 ENCOUNTER — Telehealth: Payer: Self-pay | Admitting: *Deleted

## 2019-10-03 ENCOUNTER — Ambulatory Visit: Payer: Medicare Other | Admitting: Physician Assistant

## 2019-10-03 NOTE — Telephone Encounter (Signed)
patient called requesting more pain medication. She states her leg is still swollen and painful. Advised patient to try tylenol she states that does not work for her. Sent message to Dr Donzetta Matters for advice.

## 2019-10-04 DIAGNOSIS — L02612 Cutaneous abscess of left foot: Secondary | ICD-10-CM | POA: Insufficient documentation

## 2019-10-09 ENCOUNTER — Emergency Department (HOSPITAL_COMMUNITY): Payer: Medicare Other

## 2019-10-09 ENCOUNTER — Emergency Department (HOSPITAL_COMMUNITY)
Admission: EM | Admit: 2019-10-09 | Discharge: 2019-10-09 | Disposition: A | Discharge status: Home / Self Care | Payer: Medicare Other | Source: Home / Self Care | Attending: Emergency Medicine | Admitting: Emergency Medicine

## 2019-10-09 ENCOUNTER — Other Ambulatory Visit: Payer: Self-pay

## 2019-10-09 DIAGNOSIS — J449 Chronic obstructive pulmonary disease, unspecified: Secondary | ICD-10-CM | POA: Insufficient documentation

## 2019-10-09 DIAGNOSIS — Z7982 Long term (current) use of aspirin: Secondary | ICD-10-CM | POA: Insufficient documentation

## 2019-10-09 DIAGNOSIS — R1084 Generalized abdominal pain: Secondary | ICD-10-CM | POA: Insufficient documentation

## 2019-10-09 DIAGNOSIS — E119 Type 2 diabetes mellitus without complications: Secondary | ICD-10-CM | POA: Insufficient documentation

## 2019-10-09 DIAGNOSIS — Z7984 Long term (current) use of oral hypoglycemic drugs: Secondary | ICD-10-CM | POA: Insufficient documentation

## 2019-10-09 DIAGNOSIS — Z87891 Personal history of nicotine dependence: Secondary | ICD-10-CM | POA: Insufficient documentation

## 2019-10-09 DIAGNOSIS — I130 Hypertensive heart and chronic kidney disease with heart failure and stage 1 through stage 4 chronic kidney disease, or unspecified chronic kidney disease: Secondary | ICD-10-CM | POA: Insufficient documentation

## 2019-10-09 DIAGNOSIS — I2510 Atherosclerotic heart disease of native coronary artery without angina pectoris: Secondary | ICD-10-CM | POA: Insufficient documentation

## 2019-10-09 DIAGNOSIS — N184 Chronic kidney disease, stage 4 (severe): Secondary | ICD-10-CM | POA: Insufficient documentation

## 2019-10-09 DIAGNOSIS — I509 Heart failure, unspecified: Secondary | ICD-10-CM | POA: Insufficient documentation

## 2019-10-09 DIAGNOSIS — Z79899 Other long term (current) drug therapy: Secondary | ICD-10-CM | POA: Insufficient documentation

## 2019-10-09 DIAGNOSIS — K5909 Other constipation: Secondary | ICD-10-CM | POA: Insufficient documentation

## 2019-10-09 DIAGNOSIS — E1152 Type 2 diabetes mellitus with diabetic peripheral angiopathy with gangrene: Secondary | ICD-10-CM | POA: Diagnosis not present

## 2019-10-09 DIAGNOSIS — T402X5A Adverse effect of other opioids, initial encounter: Secondary | ICD-10-CM

## 2019-10-09 LAB — CBC WITH DIFFERENTIAL/PLATELET
Abs Immature Granulocytes: 0.15 10*3/uL — ABNORMAL HIGH (ref 0.00–0.07)
Basophils Absolute: 0.1 10*3/uL (ref 0.0–0.1)
Basophils Relative: 1 %
Eosinophils Absolute: 0.1 10*3/uL (ref 0.0–0.5)
Eosinophils Relative: 1 %
HCT: 36.4 % (ref 36.0–46.0)
Hemoglobin: 11.2 g/dL — ABNORMAL LOW (ref 12.0–15.0)
Immature Granulocytes: 1 %
Lymphocytes Relative: 17 %
Lymphs Abs: 2.5 10*3/uL (ref 0.7–4.0)
MCH: 27.9 pg (ref 26.0–34.0)
MCHC: 30.8 g/dL (ref 30.0–36.0)
MCV: 90.8 fL (ref 80.0–100.0)
Monocytes Absolute: 1 10*3/uL (ref 0.1–1.0)
Monocytes Relative: 7 %
Neutro Abs: 11 10*3/uL — ABNORMAL HIGH (ref 1.7–7.7)
Neutrophils Relative %: 73 %
Platelets: 380 10*3/uL (ref 150–400)
RBC: 4.01 MIL/uL (ref 3.87–5.11)
RDW: 16 % — ABNORMAL HIGH (ref 11.5–15.5)
WBC: 14.8 10*3/uL — ABNORMAL HIGH (ref 4.0–10.5)
nRBC: 0 % (ref 0.0–0.2)

## 2019-10-09 LAB — BASIC METABOLIC PANEL
Anion gap: 15 (ref 5–15)
BUN: 51 mg/dL — ABNORMAL HIGH (ref 8–23)
CO2: 22 mmol/L (ref 22–32)
Calcium: 8.8 mg/dL — ABNORMAL LOW (ref 8.9–10.3)
Chloride: 97 mmol/L — ABNORMAL LOW (ref 98–111)
Creatinine, Ser: 6.53 mg/dL — ABNORMAL HIGH (ref 0.44–1.00)
GFR calc Af Amer: 7 mL/min — ABNORMAL LOW (ref >60)
GFR calc non Af Amer: 6 mL/min — ABNORMAL LOW (ref >60)
Glucose, Bld: 103 mg/dL — ABNORMAL HIGH (ref 70–99)
Potassium: 3.9 mmol/L (ref 3.5–5.1)
Sodium: 134 mmol/L — ABNORMAL LOW (ref 135–145)

## 2019-10-09 MED ORDER — GOLYTELY 236 G PO SOLR: 4000.0000 mL | Freq: Once | ORAL | 0 refills | Status: AC

## 2019-10-09 MED ORDER — KETAMINE HCL 10 MG/ML IJ SOLN
INTRAMUSCULAR | Status: AC | PRN
  Administered 2019-10-09: 35 mg via INTRAVENOUS

## 2019-10-09 MED ORDER — KETAMINE HCL 50 MG/5ML IJ SOSY
1.0000 mg/kg | PREFILLED_SYRINGE | Freq: Once | INTRAMUSCULAR | Status: DC
  Filled 2019-10-09: qty 10

## 2019-10-09 MED ORDER — LINACLOTIDE 145 MCG PO CAPS: 145.0000 ug | ORAL_CAPSULE | Freq: Every day | ORAL | 0 refills | Status: DC

## 2019-10-09 MED ORDER — MIDAZOLAM HCL 2 MG/2ML IJ SOLN
4.0000 mg | Freq: Once | INTRAMUSCULAR | Status: DC
  Filled 2019-10-09: qty 4

## 2019-10-09 MED ORDER — MAGNESIUM CITRATE PO SOLN
1.0000 | Freq: Once | ORAL | Status: AC
  Administered 2019-10-09: 1 via ORAL
  Filled 2019-10-09: qty 296

## 2019-10-09 MED ORDER — FENTANYL CITRATE (PF) 100 MCG/2ML IJ SOLN
25.0000 ug | Freq: Once | INTRAMUSCULAR | Status: AC
  Administered 2019-10-09: 25 ug via INTRAVENOUS
  Filled 2019-10-09: qty 2

## 2019-10-09 MED ORDER — MINERAL OIL RE ENEM
1.0000 | ENEMA | Freq: Once | RECTAL | Status: AC
  Administered 2019-10-09: 1 via RECTAL
  Filled 2019-10-09 (×2): qty 1

## 2019-10-09 MED ORDER — MIDAZOLAM HCL 2 MG/2ML IJ SOLN
INTRAMUSCULAR | Status: AC | PRN
  Administered 2019-10-09: 2 mg via INTRAVENOUS

## 2019-10-09 NOTE — ED Notes (Signed)
Pt placed in left side lying position for mineral enema. Enema instilled. Pt only able to retain ~ 1/3 to 1/2 of enema volume.

## 2019-10-09 NOTE — Discharge Instructions (Addendum)
You were seen today in the emergency department for constipation.  An attempt was made to manually disimpact your bowel, with some success.  You have been prescribed two medications called Linzess and Golytely which should help you move your bowels and relieve your constipation.   It will be important for you to stay hydrated while you take these medications; possible side effect of these medications is diarrhea.   Your constipation is likely secondary to your new use of opioid pain medications. You should utilize stool softeners regularly to avoid opioid-induced constipation in the future.  You may follow with your primary care doctor for management of your constipation.  You should return immediately to the emergency department if you develop any new severe abdominal pain, bleeding from your rectum more than a small amount on the toilet paper, if you are to move your bowels again after several days, despite your prescription medications, or if you develop any new severe symptoms.

## 2019-10-09 NOTE — Sedation Documentation (Signed)
Pt O2 increased to 6L

## 2019-10-09 NOTE — ED Triage Notes (Signed)
Pt said she take pain medication and she has been constipated x 1 week. Pt said she took 2 enemas with no relief. Pt was in dialysis this am but had to leave bc of pain from constipation.

## 2019-10-09 NOTE — ED Notes (Signed)
ED provider attempted to do disimpact patient, patient did not tolerate.

## 2019-10-09 NOTE — Progress Notes (Signed)
RRT at bedside for the procedural sedation of fecal disimpaction. Tolerated it well no apparent complication noted. oxygen therapy given to patient to support/optimize oxygenation throughout procedure. PA and MD Sabra Heck at bedside throughout.

## 2019-10-09 NOTE — Sedation Documentation (Signed)
O2 decreased to 4, pt maintaining O2

## 2019-10-09 NOTE — ED Provider Notes (Signed)
[facility] EMERGENCY DEPARTMENT Provider Note   CSN: [id number] Arrival date & time: 10/09/19  6010     History Chief Complaint  Patient presents with  . Constipation    Rebekah Johnson is a 59 y.o. female with who was recently placed opioid pain management and presents today with severe abdominal pain x 3 days and 1 week of constipation.  She reports she has been taking Colace at home and has tried 2 enemas at home without relief.  Prior to beginning opioid pain management 2 weeks ago she states she was having regular bowel movements daily or every other day.   At the time of my initial interview with the patient she is tearful, crying out in pain and rolling around in her hospital bed. She reports she took OxyContin at home to try to help with her abdominal pain. She states she was given oxycodone 2 weeks ago for belly pain, however from review of her medical record record it appears she was prescribed opioids following a vascular surgery procedure on her left leg 2 weeks ago.  I personally reviewed this patient's medical record; history of end-stage renal disease now on dialysis 3 times weekly (MWF), peripheral vascular disease, coronary artery disease, hypertension, hyperlipidemia, NSTEMI, COPD, HTN. She reports that she presented to dialysis this morning, however she left prior to her dialysis session due to her abdominal pain. She then presented to the emergency department.  HPI     Past Medical History:  Diagnosis Date  . Acute congestive heart failure ([place])   . Acute diastolic CHF (congestive heart failure) ([place]) 09/14/2016  . Atrophic vaginitis 06/07/2012  . Avitaminosis D 07/28/2012  . CAD (coronary artery disease)    a. NSTEM 09/2016: cath showing severe diffuse disease of the RCA, ramus and Cx with mild-mod disease of LAD; PCI would require multiple stents and significant contrast usage thus medical therapy recommended.  . Cellulitis 07/12/2015  . Chest pain  01/01/2017  . Chronic diastolic CHF (congestive heart failure) ([place])   . Chronic kidney disease 09/23/2016  . CKD (chronic kidney disease), stage IV ([place])   . Constipation   . COPD GOLD  0 08/31/2017   Quit smoking 07/17/17 - Spirometry 09/01/2017  FEV1 1.23 (58%)  Ratio 57 with mild curvature p no rx - 09/01/2017  After extensive coaching inhaler device,  effectiveness =    75% with elipta with cough provoked  - PFT's  10/14/2017  FEV1 1.69 (68 % ) ratio 84  p no % improvement from saba p nothing prior to study with DLCO  50 % corrects to 76  % for alv volume   - 10/14/2017  Walked RA x 3 laps @ 1  . Current tobacco use 06/10/2012  . Diabetes mellitus with neuropathy ([place])   . Diabetic foot ulcer ([place]) 07/13/2015  . Diabetic polyneuropathy associated with type 2 diabetes mellitus ([place]) 08/19/2017  . DOE (dyspnea on exertion) 09/01/2017   09/01/2017  Walked RA x 3 laps @ 185 ft each stopped due to  End of study, nl to mod fast  pace, no  desat   But stopped to rest x 2  Spirometry 09/01/2017  FEV1 1.23 (58%)  Ratio 57  > trial of anoro    . Dyspnea    with exertion  . ESRD (end stage renal disease) ([place])    MWF - [facility]  . Essential (primary) hypertension 06/10/2012  . Foot amputation status    a. h/o  left foot transmetatarsal amputation  . Headache    Migraines  . High cholesterol   . History of biliary T-tube placement 06/10/2012   Patient unaware   . History of cardiac arrest   . HLD (hyperlipidemia) 09/02/2012   Overview:  ICD-10 cut over    . Hypertension    a. patent renal arteries by PV angio 08/2015. b. heavy proteinuria 09/2016 ? nephrotic.  [name] Hypertensive heart disease with heart failure ([place])   . Hypertriglyceridemia 07/28/2012  . Leg pain 06/10/2012  . Normocytic anemia    pt denies this dx  . NSTEMI (non-ST elevated myocardial infarction) ([place]) 01/01/2017  . Obesity   . Onychomycosis 12/24/2015  . Peripheral nerve disease 09/02/2012   legs  . Peripheral vascular disease ([place])  07/28/2012  . Pneumonia 2018  . Proteinuria   . PVD (peripheral vascular disease) ([place])    a. status post left SFA and popliteal stent and left SFA PTA with drug coated balloon in 08/2015.  . Status post transmetatarsal amputation of foot, left ([place]) 09/06/2015  . Tobacco abuse    quit cigarettes 2019, smokes marijuana  . Type 2 diabetes mellitus ([place]) 06/07/2012  . Type 2 diabetes mellitus with peripheral angiopathy ([place]) 09/02/2012    Patient Active Problem List   Diagnosis Date Noted  . Constipation 08/04/2019  . Nausea without vomiting 08/04/2019  . Multinodular goiter 08/31/2018  . Cardiac arrest ([place]) 11/22/2017  . DOE (dyspnea on exertion) 09/01/2017  . COPD GOLD  0 08/31/2017  . Diabetic polyneuropathy associated with type 2 diabetes mellitus ([place]) 08/19/2017  . NSTEMI (non-ST elevated myocardial infarction) ([place]) 01/01/2017  . Chest pain 01/01/2017  . Tobacco abuse   . PVD (peripheral vascular disease) ([place])   . Proteinuria   . Obesity   . Normocytic anemia   . Hypertension   . High cholesterol   . Foot amputation status   . Diabetes mellitus with neuropathy ([place])   . CKD (chronic kidney disease), stage IV ([place])   . Chronic diastolic CHF (congestive heart failure) ([place])   . CAD (coronary artery disease) 10/27/2016  . Chronic kidney disease 09/23/2016  . Acute congestive heart failure ([place])   . Hypertensive heart disease with heart failure ([place])   . Chronic combined systolic and diastolic heart failure ([place]) 09/14/2016  . Onychomycosis 12/24/2015  . Status post transmetatarsal amputation of foot, left ([place]) 09/06/2015  . Diabetic foot ulcer ([place]) 07/13/2015  . Cellulitis 07/12/2015  . HLD (hyperlipidemia) 09/02/2012  . Peripheral nerve disease 09/02/2012  . Type 2 diabetes mellitus with peripheral angiopathy ([place]) 09/02/2012  . Hypertriglyceridemia 07/28/2012  . Peripheral vascular disease ([place]) 07/28/2012  . Avitaminosis D 07/28/2012  . Essential (primary)  hypertension 06/10/2012  . History of biliary T-tube placement 06/10/2012  . Leg pain 06/10/2012  . Current tobacco use 06/10/2012  . Atrophic vaginitis 06/07/2012  . Type 2 diabetes mellitus ([place]) 06/07/2012    Past Surgical History:  Procedure Laterality Date  . ABDOMINAL AORTOGRAM W/LOWER EXTREMITY Left 09/25/2019   Procedure: ABDOMINAL AORTOGRAM W/LOWER EXTREMITY;  Surgeon: Waynetta Sandy, MD;  Location: [facility];  Service: Cardiovascular;  Laterality: Left;  . ABDOMINAL HYSTERECTOMY    . AMPUTATION Left 09/06/2015   Procedure: LEFT TRANSMETATARSAL AMPUTATTION;  Surgeon: Newt Minion, MD;  Location: [facility];  Service: Orthopedics;  Laterality: Left;  . AV FISTULA PLACEMENT Left 12/13/2018   Procedure: ARTERIOVENOUS (AV) FISTULA CREATION LEFT UPPER ARM;  Surgeon: Waynetta Sandy, MD;  Location:  [facility];  Service: Vascular;  Laterality: Left;  . BASCILIC VEIN TRANSPOSITION Left 03/30/2019   Procedure: LEFT ARM ARTERIOVENOUS VEIN TRANSPOSITION;  Surgeon: Serafina Mitchell, MD;  Location: [facility];  Service: Vascular;  Laterality: Left;  . COLONOSCOPY     polyps x 1  . LEFT HEART CATH AND CORONARY ANGIOGRAPHY N/A 09/17/2016   Procedure: LEFT HEART CATH AND CORONARY ANGIOGRAPHY;  Surgeon: Jettie Booze, MD;  Location: [facility];  Service: Cardiovascular;  Laterality: N/A;  . PERIPHERAL VASCULAR ATHERECTOMY Left 09/25/2019   Procedure: PERIPHERAL VASCULAR ATHERECTOMY;  Surgeon: Waynetta Sandy, MD;  Location: [facility];  Service: Cardiovascular;  Laterality: Left;  SFA  . PERIPHERAL VASCULAR CATHETERIZATION N/A 08/16/2015   Procedure: Abdominal Aortogram;  Surgeon: Elam Dutch, MD;  Location: [facility];  Service: Cardiovascular;  Laterality: N/A;  . PERIPHERAL VASCULAR CATHETERIZATION Bilateral 08/16/2015   Procedure: Lower Extremity Angiography;  Surgeon: Elam Dutch, MD;  Location: [facility];  Service:  Cardiovascular;  Laterality: Bilateral;  . PERIPHERAL VASCULAR CATHETERIZATION Left 08/16/2015   Procedure: Peripheral Vascular Intervention;  Surgeon: Elam Dutch, MD;  Location: [facility];  Service: Cardiovascular;  Laterality: Left;  SFA STENT X 2  . PERIPHERAL VASCULAR INTERVENTION Left 09/25/2019   Procedure: PERIPHERAL VASCULAR INTERVENTION;  Surgeon: Waynetta Sandy, MD;  Location: [facility];  Service: Cardiovascular;  Laterality: Left;  SFA     OB History   No obstetric history on file.     Family History  Problem Relation Age of Onset  . Diabetes Mother   . Hypertension Father   . Colon cancer Neg Hx   . Stomach cancer Neg Hx   . Pancreatic cancer Neg Hx     Social History   Tobacco Use  . Smoking status: Former Smoker    Packs/day: 1.00    Years: 42.00    Pack years: 42.00    Types: Cigarettes    Quit date: 07/17/2017    Years since quitting: 2.2  . Smokeless tobacco: Never Used  Vaping Use  . Vaping Use: Never used  Substance Use Topics  . Alcohol use: Not Currently    Alcohol/week: 1.0 - 2.0 standard drink    Types: 1 - 2 Glasses of wine per week    Comment: on social occassions  . Drug use: Yes    Frequency: 7.0 times per week    Types: Marijuana    Comment: daily use - last use 03/28/19    Home Medications Prior to Admission medications   Medication Sig Start Date End Date Taking? Authorizing Provider  acetaminophen (TYLENOL) 500 MG tablet Take 1,000 mg by mouth every 6 (six) hours as needed for moderate pain or headache.    [provider]  amLODipine (NORVASC) 5 MG tablet Take 1 tablet (5 mg total) by mouth daily. 08/22/19   Dorothy Spark, MD  Ascorbic Acid (VITAMIN C) 1000 MG tablet Take 1,000 mg by mouth daily.    [provider]  aspirin 81 MG EC tablet Take 1 tablet (81 mg total) by mouth daily. Reported on 07/13/2015 07/02/16   Charlott Rakes, MD  atorvastatin (LIPITOR) 80 MG tablet Take 1 tablet  (80 mg total) by mouth daily. 10/07/17   Scot Jun, FNP  Blood Glucose Monitoring Suppl (ACCU-CHEK AVIVA) device Use as instructed three times daily. Patient taking differently: Use as instructed twice times daily. 04/22/16   Newlin,  Enobong, MD  carvedilol (COREG) 25 MG tablet Take 1 tablet (25 mg total) by mouth 2 (two) times daily. 10/07/17   Scot Jun, FNP  cholecalciferol (VITAMIN D3) 25 MCG (1000 UT) tablet Take 1,000 Units by mouth daily.    [provider]  clopidogrel (PLAVIX) 75 MG tablet Take 1 tablet (75 mg total) by mouth daily. 10/07/17   Scot Jun, FNP  docusate sodium (COLACE) 100 MG capsule Take 200 mg by mouth 2 (two) times daily.     [provider]  doxycycline (VIBRA-TABS) 100 MG tablet Take 1 tablet (100 mg total) by mouth daily. 09/28/19   Waynetta Sandy, MD  furosemide (LASIX) 80 MG tablet Take 80 mg by mouth 2 (two) times daily. 03/21/18   [provider]  gabapentin (NEURONTIN) 300 MG capsule Take 1 capsule (300 mg total) by mouth 3 (three) times daily. 10/07/17   Scot Jun, FNP  glipiZIDE (GLUCOTROL) 5 MG tablet Take 0.5 tablets (2.5 mg total) by mouth daily before breakfast. Patient taking differently: Take 5 mg by mouth daily before breakfast.  11/01/18   Renato Shin, MD  glucose blood (ACCU-CHEK AVIVA) test strip Use as instructed three times daily before meals. 10/14/17   Charlott Rakes, MD  hydrALAZINE (APRESOLINE) 50 MG tablet Take 50 mg by mouth 3 (three) times daily.    [provider]  HYDROcodone-acetaminophen (NORCO/VICODIN) 5-325 MG tablet Take 1 tablet by mouth every 6 (six) hours as needed for moderate pain. 08/08/19   Persons, Bevely Palmer, PA  isosorbide mononitrate (IMDUR) 30 MG 24 hr tablet Take 60 mg by mouth daily. 03/22/18   [provider]  lactulose (CHRONULAC) 10 GM/15ML solution Take 15 mLs (10 g total) by mouth 3 (three) times daily. 03/21/19   Lawyer, Harrell Gave,  PA-C  Lancet Devices [facility]) lancets Use as instructed three times daily before meals. 04/22/16   Charlott Rakes, MD  latanoprost (XALATAN) 0.005 % ophthalmic solution Place 1 drop into both eyes at bedtime.     [provider]  nitroGLYCERIN (NITRODUR - DOSED IN MG/24 HR) 0.2 mg/hr patch Place 1 patch (0.2 mg total) onto the skin daily. 08/15/19   Newt Minion, MD  nitroGLYCERIN (NITROSTAT) 0.4 MG SL tablet Place 1 tablet (0.4 mg total) under the tongue every 5 (five) minutes as needed for chest pain. 01/18/17 03/21/19  Dorothy Spark, MD  Omega-3 Fatty Acids (FISH OIL) 1000 MG CAPS Take 1,000 mg by mouth daily.     [provider]  oxyCODONE-acetaminophen (PERCOCET/ROXICET) 5-325 MG tablet Take 1 tablet by mouth every 4 (four) hours as needed for severe pain. 09/15/19   Waynetta Sandy, MD  ranolazine (RANEXA) 500 MG 12 hr tablet Take 1 tablet by mouth twice daily 07/04/19   Dorothy Spark, MD  sevelamer carbonate (RENVELA) 800 MG tablet Take 800 mg by mouth See admin instructions. Three times daily with meals and with snack.    [provider]  silver sulfADIAZINE (SILVADENE) 1 % cream Apply 1 application topically daily. Apply to affected area daily plus dry dressing Patient taking differently: Apply 1 application topically daily as needed (wound care).  07/11/18   Newt Minion, MD  sitaGLIPtin (JANUVIA) 25 MG tablet Take 1 tablet (25 mg total) by mouth daily. 10/07/17   Scot Jun, FNP  ZOFRAN 4 MG tablet Take 4 mg by mouth every 8 (eight) hours as needed for nausea/vomiting. 03/08/19   [provider]  Allergies    Patient has no known allergies.  Review of Systems   Review of Systems  Constitutional: Positive for appetite change. Negative for chills, diaphoresis, fatigue and fever.  HENT: Negative.   Respiratory: Negative for cough, chest tightness and shortness of breath.   Cardiovascular: Negative for chest pain,  palpitations and leg swelling.  Gastrointestinal: Positive for abdominal pain, constipation and nausea. Negative for anal bleeding, blood in stool and vomiting.  Genitourinary: Negative for dysuria.  Musculoskeletal: Negative.   Neurological: Negative.   Psychiatric/Behavioral: The patient is nervous/anxious.     Physical Exam Updated Vital Signs BP (!) 178/68 (BP Location: Right Arm)   Pulse 76   Temp 98.8 F (37.1 C) (Oral)   Resp 20   LMP  (LMP Unknown)   SpO2 100%   Physical Exam Vitals and nursing note reviewed.  Constitutional:      Appearance: She is ill-appearing.  HENT:     Head: Normocephalic and atraumatic.     Mouth/Throat:     Mouth: Mucous membranes are moist.     Pharynx: No oropharyngeal exudate or posterior oropharyngeal erythema.  Eyes:     General:        Right eye: No discharge.        Left eye: No discharge.     Conjunctiva/sclera: Conjunctivae normal.  Cardiovascular:     Rate and Rhythm: Normal rate and regular rhythm.     Pulses: Normal pulses.     Heart sounds: Normal heart sounds. No murmur heard.      Comments: LUE antecubital dialysis fistula, palpable thrill without erythema or warmth  Pulmonary:     Effort: Pulmonary effort is normal. No respiratory distress.     Breath sounds: Normal breath sounds. No wheezing or rales.  Abdominal:     General: Bowel sounds are decreased. There is no distension.     Palpations: Abdomen is soft.     Tenderness: There is generalized abdominal tenderness. There is no guarding or rebound. Negative signs include Murphy's sign.  Musculoskeletal:        General: No deformity.     Cervical back: Neck supple.     Right lower leg: No edema.     Left lower leg: No edema.       Feet:     Left Lower Extremity: Left leg is amputated below ankle.  Skin:    General: Skin is warm and dry.     Capillary Refill: Capillary refill takes less than 2 seconds.  Neurological:     General: No focal deficit present.      Mental Status: She is alert. Mental status is at baseline.  Psychiatric:        Mood and Affect: Mood normal.    ED Results / Procedures / Treatments   Labs (all labs ordered are listed, but only abnormal results are displayed) Labs Reviewed  CBC WITH DIFFERENTIAL/PLATELET - Abnormal; Notable for the following components:      Result Value   WBC 14.8 (*)    Hemoglobin 11.2 (*)    RDW 16.0 (*)    Neutro Abs 11.0 (*)    Abs Immature Granulocytes 0.15 (*)    All other components within normal limits  BASIC METABOLIC PANEL - Abnormal; Notable for the following components:   Sodium 134 (*)    Chloride 97 (*)    Glucose, Bld 103 (*)    BUN 51 (*)    Creatinine, Ser 6.53 (*)  Calcium 8.8 (*)    GFR calc non Af Amer 6 (*)    GFR calc Af Amer 7 (*)    All other components within normal limits    EKG None  Radiology DG Abdomen 1 View  Result Date: 10/09/2019 CLINICAL DATA:  Abdomen pain EXAM: ABDOMEN - 1 VIEW COMPARISON:  03/17/2019 FINDINGS: Scattered bowel gas is present without definitive air distension of small bowel. Radiopaque material within the rectosigmoid colon. No radiopaque calculi. IMPRESSION: Overall nonobstructed gas pattern. There is radiopaque material in the rectosigmoid colon Electronically Signed   By: Donavan Foil M.D.   On: 10/09/2019 16:22    Procedures Fecal disimpaction  Date/Time: 10/09/2019 8:31 PM Performed by: Emeline Darling, PA-C Authorized by: Emeline Darling, PA-C  Local anesthesia used: no  Anesthesia: Local anesthesia used: no  Sedation: Patient sedated: yes Sedation type: anxiolysis and moderate (conscious) sedation Sedatives: ketamine and midazolam Sedation start date/time: 10/09/2019 8:16 AM Sedation end date/time: 10/09/2019 8:32 PM Vitals: Vital signs were monitored during sedation.  Patient tolerance: patient tolerated the procedure well with no immediate complications Comments: Limited disimpaction; patient unable  to assist with passage of stool due to sedation    (including critical care time)  Medications Ordered in ED Medications  mineral oil enema 1 enema (has no administration in time range)  fentaNYL (SUBLIMAZE) injection 25 mcg (25 mcg Intravenous Given 10/09/19 1640)    ED Course  I have reviewed the triage vital signs and the nursing notes.  Pertinent labs & imaging results that were available during my care of the patient were reviewed by me and considered in my medical decision making (see chart for details).    MDM Rules/Calculators/A&P                          59 year old female with extensive medical history who presents with 1 week of constipation, after beginning new opioid therapy x 2 weeks.   Hypertensive on intake, otherwise vital signs are stable  CBC with leukocytosis of 14.2, anemia of 11.2 (previously 13)  BMP with creatinine of 6.5 (previously 8.5), potassium is normal at 3.9.  At the time of my initial evaluation the patient she is lying around her hospital bed crying out in pain, she is tearful.  Patient declining rectal exam at this time.  Will order fentanyl for pain management, KUB to evaluate stool burden.   KUB with significant rectosigmoid stool burden / impaction measuring 7 cm diameter at widest point.   Patient continues to decline digital rectal exam.  Will try mineral oil enema, but discussed with patient that if this is unsuccessful in relieving her impaction we may need to proceed with digital rectal exam for manual disimpaction of her bowels.   Per nurse, patient unable to tolerate enema.  Nurse states that she was only able to retain 1/4-1/2 of the enema solution.  Patient consented to manual bowel disimpaction, attempted by Dr. Sabra Heck attending physician.  Unfortunately the patient did not tolerate the procedure and demanded we discontinue it prior to being able to disimpact her bowel.  Care of this patient staffed with attending Dr. Sabra Heck, who  suggests conscious sedation with ketamine in order to perform manual disimpaction of this patient's rectum.  Risks of ketamine including but not limited to CNS depression, respiratory depression, hypotension, and hallucinations were discussed with the patient. She voiced understanding of these risks and expressed her desire to move forward with conscious sedation  for manual disimpaction of her bowels.  Pharmacy was consulted during ketamine dosage in this patient with end-stage renal disease on dialysis 3 times weekly; pharmacy recommendation: with 1 to time dosage for procedure there is no dose adjustment necessary for patient with renal compromise. Will also order versed PRN for patient during procedure  Attending physician Dr. Sabra Heck conducted procedural sedation with versed and ketamine. Respiratory therapist was at the bedside.  Moderate amount stool relieved from rectal vault - Stool was soft. Scant blood from minor skin trauma at time of procedure. Patient tolerated the procedure and sedation well, without acute complication.   Mag citrate ordered in the ED   Plan to discharge home with Rx for Linzess and Miralax, close follow up with PCP.  It is likely that this patient's constipation is secondary to opioid use.  Will be important that she utilize stool softeners daily if she continues to utilize opioids in the future. On reevaluation of the patient she is feeling much more comfortable at this time. She is resting quietly in her bed. She was able to have 1 small bowel movement while here in the ED.  At this time I do not feel any further work-up is necessary in the emergency department. Her vital signs remain stable, and she has recovered following procedural sedation.  Addalyne voiced understanding of treatment plan, and each of her questions were answered to her expressed satisfaction.  She is amenable to discharge.  Patient is stable for discharge at this time.  Strict return precautions were  given.   Final Clinical Impression(s) / ED Diagnoses Final diagnoses:  None    Rx / DC Orders ED Discharge Orders    None       Aura Dials 10/09/19 2154    Noemi Chapel, MD 10/10/19 [phone]

## 2019-10-09 NOTE — ED Provider Notes (Signed)
Started on Oxycontin 2 weeks ago for something with abd pain - she has abd pain now and no BM in a week - has diffuse ttp - rectal exam - see PA note, no fever, no tachycardia, pain meds, possible enema if impacted  This patient did not tolerate enema, she continued to scream in pain due to the rectal pressure.  The x-ray did in fact show a large amount of stool in the rectum, she would not allow anyone to do a rectal exam.  She did consent to procedural sedation.  Ketamine and 2 mg of Versed was used for a total of 35 mg of ketamine and 2 mg of Versed.  This achieved adequate sedation and we were able to remove a moderate amount of stool digitally, please see separate note.  Please see procedure note below for procedural sedation, patient is feeling much better, she has had a spontaneous small bowel movement, she will have a bowel regimen prescribed and recommended and she is agreeable to the plan.  After disimpaction this patient did much better, she was able to ambulate around the department, she had minimal pain and was comfortable with the plan for discharge.  .Sedation  Date/Time: 10/09/2019 9:54 PM Performed by: Noemi Chapel, MD Authorized by: Noemi Chapel, MD   Consent:    Consent obtained:  Written and verbal   Consent given by:  Patient   Risks discussed:  Allergic reaction, dysrhythmia, inadequate sedation, nausea, prolonged hypoxia resulting in organ damage, prolonged sedation necessitating reversal, respiratory compromise necessitating ventilatory assistance and intubation and vomiting   Alternatives discussed:  Analgesia without sedation, anxiolysis and regional anesthesia Universal protocol:    Procedure explained and questions answered to patient or proxy's satisfaction: yes     Relevant documents present and verified: yes     Test results available and properly labeled: yes     Imaging studies available: yes     Required blood products, implants, devices, and special  equipment available: yes     Site/side marked: yes     Immediately prior to procedure a time out was called: yes     Patient identity confirmation method:  Verbally with patient Indications:    Sedation is required to allow for: stool disimpaction.   Procedure necessitating sedation performed by:  Physician performing sedation Pre-sedation assessment:    Time since last food or drink:  4   ASA classification: class 3 - patient with severe systemic disease     Neck mobility: normal     Mouth opening:  3 or more finger widths   Thyromental distance:  4 finger widths   Mallampati score:  I - soft palate, uvula, fauces, pillars visible   Pre-sedation assessments completed and reviewed: airway patency, cardiovascular function, hydration status, mental status, nausea/vomiting, pain level, respiratory function and temperature     Pre-sedation assessment completed:  10/09/2019 8:00 PM Immediate pre-procedure details:    Reassessment: Patient reassessed immediately prior to procedure     Reviewed: vital signs, relevant labs/tests and NPO status     Verified: bag valve mask available, emergency equipment available, intubation equipment available, IV patency confirmed, oxygen available and suction available   Procedure details (see MAR for exact dosages):    Preoxygenation:  Nasal cannula   Sedation:  Ketamine and midazolam   Intended level of sedation: deep   Intra-procedure monitoring:  Blood pressure monitoring, cardiac monitor, continuous pulse oximetry, frequent LOC assessments, frequent vital sign checks and continuous capnometry   Intra-procedure events:  none     Total Provider sedation time (minutes):  15 Post-procedure details:    Post-sedation assessment completed:  10/09/2019 9:00 PM   Attendance: Constant attendance by certified staff until patient recovered     Recovery: Patient returned to pre-procedure baseline     Post-sedation assessments completed and reviewed: airway patency,  cardiovascular function, hydration status, mental status, nausea/vomiting, pain level, respiratory function and temperature     Patient is stable for discharge or admission: yes     Patient tolerance:  Tolerated well, no immediate complications Comments:           Medical screening examination/treatment/procedure(s) were conducted as a shared visit with non-physician practitioner(s) and myself.  I personally evaluated the patient during the encounter.  Clinical Impression:   Final diagnoses:  Constipation due to opioid therapy         Noemi Chapel, MD 10/10/19 [phone]

## 2019-10-09 NOTE — Sedation Documentation (Signed)
Patient able to reposition self in bed. Patient speaking with staff without difficulty. Vitals stable. Patient does voice some relief after procedure. Linens clean and dry. Call bell in reach, reinforced need to use same, pt verbalized understanding. Pt provided with warm blanket, denies further needs.

## 2019-10-09 NOTE — ED Notes (Signed)
ED Provider at bedside. 

## 2019-10-09 NOTE — Sedation Documentation (Signed)
Vital signs stable. 

## 2019-10-09 NOTE — Sedation Documentation (Signed)
Respiratory therapist Marice bedside

## 2019-10-09 NOTE — Sedation Documentation (Signed)
Pt O2 decreased to 2L, continues to maintain O2

## 2019-10-09 NOTE — ED Notes (Signed)
Patient signed informed consent for moderate sedation for fecal disimpaction. Same placed in medical records drawer at nurse station.

## 2019-10-09 NOTE — Sedation Documentation (Signed)
O2 d/c, pt continues to maintain O2

## 2019-10-10 ENCOUNTER — Other Ambulatory Visit: Payer: Self-pay

## 2019-10-10 DIAGNOSIS — I739 Peripheral vascular disease, unspecified: Secondary | ICD-10-CM

## 2019-10-11 ENCOUNTER — Emergency Department (HOSPITAL_COMMUNITY): Payer: Medicare Other

## 2019-10-11 ENCOUNTER — Other Ambulatory Visit: Payer: Self-pay

## 2019-10-11 ENCOUNTER — Encounter (HOSPITAL_COMMUNITY): Payer: Self-pay | Admitting: Emergency Medicine

## 2019-10-11 ENCOUNTER — Inpatient Hospital Stay (HOSPITAL_COMMUNITY)
Admission: EM | Admit: 2019-10-11 | Discharge: 2019-10-19 | DRG: 239 | Disposition: A | Discharge status: Home Health | Payer: Medicare Other | Attending: Internal Medicine | Admitting: Internal Medicine

## 2019-10-11 DIAGNOSIS — Z7902 Long term (current) use of antithrombotics/antiplatelets: Secondary | ICD-10-CM | POA: Diagnosis not present

## 2019-10-11 DIAGNOSIS — M86179 Other acute osteomyelitis, unspecified ankle and foot: Secondary | ICD-10-CM | POA: Diagnosis not present

## 2019-10-11 DIAGNOSIS — I5032 Chronic diastolic (congestive) heart failure: Secondary | ICD-10-CM | POA: Diagnosis present

## 2019-10-11 DIAGNOSIS — Z7982 Long term (current) use of aspirin: Secondary | ICD-10-CM

## 2019-10-11 DIAGNOSIS — Z7984 Long term (current) use of oral hypoglycemic drugs: Secondary | ICD-10-CM

## 2019-10-11 DIAGNOSIS — E1169 Type 2 diabetes mellitus with other specified complication: Secondary | ICD-10-CM | POA: Diagnosis present

## 2019-10-11 DIAGNOSIS — E1152 Type 2 diabetes mellitus with diabetic peripheral angiopathy with gangrene: Principal | ICD-10-CM | POA: Diagnosis present

## 2019-10-11 DIAGNOSIS — E11621 Type 2 diabetes mellitus with foot ulcer: Secondary | ICD-10-CM | POA: Diagnosis present

## 2019-10-11 DIAGNOSIS — Z794 Long term (current) use of insulin: Secondary | ICD-10-CM | POA: Diagnosis not present

## 2019-10-11 DIAGNOSIS — R1084 Generalized abdominal pain: Secondary | ICD-10-CM | POA: Insufficient documentation

## 2019-10-11 DIAGNOSIS — N186 End stage renal disease: Secondary | ICD-10-CM | POA: Diagnosis present

## 2019-10-11 DIAGNOSIS — Z87891 Personal history of nicotine dependence: Secondary | ICD-10-CM | POA: Diagnosis not present

## 2019-10-11 DIAGNOSIS — L97521 Non-pressure chronic ulcer of other part of left foot limited to breakdown of skin: Secondary | ICD-10-CM | POA: Diagnosis not present

## 2019-10-11 DIAGNOSIS — I96 Gangrene, not elsewhere classified: Secondary | ICD-10-CM | POA: Diagnosis present

## 2019-10-11 DIAGNOSIS — N2581 Secondary hyperparathyroidism of renal origin: Secondary | ICD-10-CM | POA: Diagnosis present

## 2019-10-11 DIAGNOSIS — M86172 Other acute osteomyelitis, left ankle and foot: Secondary | ICD-10-CM | POA: Diagnosis present

## 2019-10-11 DIAGNOSIS — E1142 Type 2 diabetes mellitus with diabetic polyneuropathy: Secondary | ICD-10-CM | POA: Diagnosis present

## 2019-10-11 DIAGNOSIS — T40605A Adverse effect of unspecified narcotics, initial encounter: Secondary | ICD-10-CM | POA: Diagnosis present

## 2019-10-11 DIAGNOSIS — E1165 Type 2 diabetes mellitus with hyperglycemia: Secondary | ICD-10-CM | POA: Diagnosis present

## 2019-10-11 DIAGNOSIS — M868X7 Other osteomyelitis, ankle and foot: Secondary | ICD-10-CM

## 2019-10-11 DIAGNOSIS — Z992 Dependence on renal dialysis: Secondary | ICD-10-CM | POA: Diagnosis not present

## 2019-10-11 DIAGNOSIS — E8889 Other specified metabolic disorders: Secondary | ICD-10-CM | POA: Diagnosis present

## 2019-10-11 DIAGNOSIS — Z9115 Patient's noncompliance with renal dialysis: Secondary | ICD-10-CM | POA: Diagnosis not present

## 2019-10-11 DIAGNOSIS — I739 Peripheral vascular disease, unspecified: Secondary | ICD-10-CM | POA: Diagnosis not present

## 2019-10-11 DIAGNOSIS — K6289 Other specified diseases of anus and rectum: Secondary | ICD-10-CM | POA: Diagnosis present

## 2019-10-11 DIAGNOSIS — E119 Type 2 diabetes mellitus without complications: Secondary | ICD-10-CM | POA: Diagnosis not present

## 2019-10-11 DIAGNOSIS — Z955 Presence of coronary angioplasty implant and graft: Secondary | ICD-10-CM

## 2019-10-11 DIAGNOSIS — E785 Hyperlipidemia, unspecified: Secondary | ICD-10-CM | POA: Diagnosis present

## 2019-10-11 DIAGNOSIS — T402X5A Adverse effect of other opioids, initial encounter: Secondary | ICD-10-CM | POA: Diagnosis present

## 2019-10-11 DIAGNOSIS — E7800 Pure hypercholesterolemia, unspecified: Secondary | ICD-10-CM | POA: Diagnosis present

## 2019-10-11 DIAGNOSIS — I2510 Atherosclerotic heart disease of native coronary artery without angina pectoris: Secondary | ICD-10-CM | POA: Diagnosis present

## 2019-10-11 DIAGNOSIS — I70244 Atherosclerosis of native arteries of left leg with ulceration of heel and midfoot: Secondary | ICD-10-CM | POA: Diagnosis not present

## 2019-10-11 DIAGNOSIS — J449 Chronic obstructive pulmonary disease, unspecified: Secondary | ICD-10-CM | POA: Diagnosis present

## 2019-10-11 DIAGNOSIS — Z90710 Acquired absence of both cervix and uterus: Secondary | ICD-10-CM

## 2019-10-11 DIAGNOSIS — Z20822 Contact with and (suspected) exposure to covid-19: Secondary | ICD-10-CM | POA: Diagnosis present

## 2019-10-11 DIAGNOSIS — K5903 Drug induced constipation: Secondary | ICD-10-CM | POA: Diagnosis present

## 2019-10-11 DIAGNOSIS — Z79899 Other long term (current) drug therapy: Secondary | ICD-10-CM | POA: Diagnosis not present

## 2019-10-11 DIAGNOSIS — E781 Pure hyperglyceridemia: Secondary | ICD-10-CM | POA: Diagnosis present

## 2019-10-11 DIAGNOSIS — I132 Hypertensive heart and chronic kidney disease with heart failure and with stage 5 chronic kidney disease, or end stage renal disease: Secondary | ICD-10-CM | POA: Diagnosis present

## 2019-10-11 DIAGNOSIS — M869 Osteomyelitis, unspecified: Secondary | ICD-10-CM

## 2019-10-11 DIAGNOSIS — E1122 Type 2 diabetes mellitus with diabetic chronic kidney disease: Secondary | ICD-10-CM | POA: Diagnosis present

## 2019-10-11 DIAGNOSIS — R109 Unspecified abdominal pain: Secondary | ICD-10-CM

## 2019-10-11 DIAGNOSIS — I252 Old myocardial infarction: Secondary | ICD-10-CM

## 2019-10-11 DIAGNOSIS — D649 Anemia, unspecified: Secondary | ICD-10-CM | POA: Diagnosis present

## 2019-10-11 LAB — COMPREHENSIVE METABOLIC PANEL
ALT: 13 U/L (ref 0–44)
AST: 23 U/L (ref 15–41)
Albumin: 2.9 g/dL — ABNORMAL LOW (ref 3.5–5.0)
Alkaline Phosphatase: 112 U/L (ref 38–126)
Anion gap: 16 — ABNORMAL HIGH (ref 5–15)
BUN: 22 mg/dL (ref 8–23)
CO2: 27 mmol/L (ref 22–32)
Calcium: 8.8 mg/dL — ABNORMAL LOW (ref 8.9–10.3)
Chloride: 96 mmol/L — ABNORMAL LOW (ref 98–111)
Creatinine, Ser: 3.66 mg/dL — ABNORMAL HIGH (ref 0.44–1.00)
GFR calc non Af Amer: 13 mL/min — ABNORMAL LOW (ref >60)
Glucose, Bld: 126 mg/dL — ABNORMAL HIGH (ref 70–99)
Potassium: 4.2 mmol/L (ref 3.5–5.1)
Sodium: 139 mmol/L (ref 135–145)
Total Bilirubin: 0.4 mg/dL (ref 0.3–1.2)
Total Protein: 8.3 g/dL — ABNORMAL HIGH (ref 6.5–8.1)

## 2019-10-11 LAB — HEMOGLOBIN A1C
Hgb A1c MFr Bld: 6.8 % — ABNORMAL HIGH (ref 4.8–5.6)
Mean Plasma Glucose: 148.46 mg/dL

## 2019-10-11 LAB — GLUCOSE, CAPILLARY
Glucose-Capillary: 94 mg/dL (ref 70–99)
Glucose-Capillary: 114 mg/dL — ABNORMAL HIGH (ref 70–99)

## 2019-10-11 LAB — RESP PANEL BY RT PCR (RSV, FLU A&B, COVID)
Influenza A by PCR: NEGATIVE
Influenza B by PCR: NEGATIVE
Respiratory Syncytial Virus by PCR: NEGATIVE
SARS Coronavirus 2 by RT PCR: NEGATIVE

## 2019-10-11 LAB — LACTIC ACID, PLASMA
Lactic Acid, Venous: 4 mmol/L (ref 0.5–1.9)
Lactic Acid, Venous: 1 mmol/L (ref 0.5–1.9)

## 2019-10-11 LAB — CBC
HCT: 39.7 % (ref 36.0–46.0)
Hemoglobin: 12.3 g/dL (ref 12.0–15.0)
MCH: 27.8 pg (ref 26.0–34.0)
MCHC: 31 g/dL (ref 30.0–36.0)
MCV: 89.8 fL (ref 80.0–100.0)
Platelets: 454 10*3/uL — ABNORMAL HIGH (ref 150–400)
RBC: 4.42 MIL/uL (ref 3.87–5.11)
RDW: 16.1 % — ABNORMAL HIGH (ref 11.5–15.5)
WBC: 18.1 10*3/uL — ABNORMAL HIGH (ref 4.0–10.5)
nRBC: 0 % (ref 0.0–0.2)

## 2019-10-11 LAB — LIPASE, BLOOD: Lipase: 50 U/L (ref 11–51)

## 2019-10-11 MED ORDER — ATORVASTATIN CALCIUM 80 MG PO TABS
80.0000 mg | ORAL_TABLET | Freq: Every day | ORAL | Status: DC
  Administered 2019-10-11 – 2019-10-18 (×8): 80 mg via ORAL
  Filled 2019-10-11 (×8): qty 1

## 2019-10-11 MED ORDER — INSULIN ASPART 100 UNIT/ML ~~LOC~~ SOLN: 0.0000 [IU] | Freq: Three times a day (TID) | SUBCUTANEOUS | Status: DC

## 2019-10-11 MED ORDER — FENTANYL CITRATE (PF) 100 MCG/2ML IJ SOLN
75.0000 ug | Freq: Once | INTRAMUSCULAR | Status: AC
  Administered 2019-10-11: 75 ug via INTRAVENOUS
  Filled 2019-10-11: qty 2

## 2019-10-11 MED ORDER — SODIUM CHLORIDE 0.9 % IV BOLUS
500.0000 mL | Freq: Once | INTRAVENOUS | Status: AC
  Administered 2019-10-11: 500 mL via INTRAVENOUS

## 2019-10-11 MED ORDER — SEVELAMER CARBONATE 800 MG PO TABS
800.0000 mg | ORAL_TABLET | Freq: Three times a day (TID) | ORAL | Status: DC
  Administered 2019-10-12 – 2019-10-19 (×16): 800 mg via ORAL
  Filled 2019-10-11 (×17): qty 1

## 2019-10-11 MED ORDER — LINACLOTIDE 145 MCG PO CAPS
145.0000 ug | ORAL_CAPSULE | Freq: Every day | ORAL | Status: DC
  Administered 2019-10-13 – 2019-10-19 (×4): 145 ug via ORAL
  Filled 2019-10-11 (×8): qty 1

## 2019-10-11 MED ORDER — ISOSORBIDE MONONITRATE ER 60 MG PO TB24
60.0000 mg | ORAL_TABLET | Freq: Every day | ORAL | Status: DC
  Administered 2019-10-12 – 2019-10-19 (×7): 60 mg via ORAL
  Filled 2019-10-11 (×8): qty 1

## 2019-10-11 MED ORDER — RANOLAZINE ER 500 MG PO TB12
500.0000 mg | ORAL_TABLET | Freq: Two times a day (BID) | ORAL | Status: DC
  Administered 2019-10-11 – 2019-10-19 (×15): 500 mg via ORAL
  Filled 2019-10-11 (×15): qty 1

## 2019-10-11 MED ORDER — HYDRALAZINE HCL 50 MG PO TABS
50.0000 mg | ORAL_TABLET | Freq: Three times a day (TID) | ORAL | Status: DC
  Administered 2019-10-12 – 2019-10-19 (×22): 50 mg via ORAL
  Filled 2019-10-11 (×22): qty 1

## 2019-10-11 MED ORDER — LACTULOSE 10 GM/15ML PO SOLN
10.0000 g | Freq: Three times a day (TID) | ORAL | Status: DC
  Administered 2019-10-11 – 2019-10-19 (×12): 10 g via ORAL
  Filled 2019-10-11 (×14): qty 15

## 2019-10-11 MED ORDER — METRONIDAZOLE IN NACL 5-0.79 MG/ML-% IV SOLN
500.0000 mg | Freq: Three times a day (TID) | INTRAVENOUS | Status: DC
  Administered 2019-10-11 – 2019-10-19 (×23): 500 mg via INTRAVENOUS
  Filled 2019-10-11 (×23): qty 100

## 2019-10-11 MED ORDER — IOHEXOL 300 MG/ML  SOLN
75.0000 mL | Freq: Once | INTRAMUSCULAR | Status: AC | PRN
  Administered 2019-10-11: 75 mL via INTRAVENOUS

## 2019-10-11 MED ORDER — LORAZEPAM 2 MG/ML IJ SOLN
1.0000 mg | Freq: Once | INTRAMUSCULAR | Status: AC
  Administered 2019-10-11: 1 mg via INTRAVENOUS
  Filled 2019-10-11: qty 1

## 2019-10-11 MED ORDER — HYDROMORPHONE HCL 1 MG/ML IJ SOLN
1.0000 mg | Freq: Four times a day (QID) | INTRAMUSCULAR | Status: DC | PRN
  Administered 2019-10-11 – 2019-10-18 (×20): 1 mg via INTRAVENOUS
  Filled 2019-10-11 (×20): qty 1

## 2019-10-11 MED ORDER — METRONIDAZOLE IVPB CUSTOM: 250.0000 mg | Freq: Three times a day (TID) | INTRAVENOUS | Status: DC

## 2019-10-11 MED ORDER — VANCOMYCIN HCL 750 MG/150ML IV SOLN
750.0000 mg | INTRAVENOUS | Status: DC
  Filled 2019-10-11: qty 150

## 2019-10-11 MED ORDER — VANCOMYCIN HCL 1500 MG/300ML IV SOLN
1500.0000 mg | Freq: Once | INTRAVENOUS | Status: AC
  Administered 2019-10-11: 1500 mg via INTRAVENOUS
  Filled 2019-10-11: qty 300

## 2019-10-11 MED ORDER — CLINDAMYCIN PHOSPHATE 600 MG/50ML IV SOLN: 600.0000 mg | Freq: Once | INTRAVENOUS | Status: DC

## 2019-10-11 MED ORDER — ONDANSETRON HCL 4 MG/2ML IJ SOLN
4.0000 mg | Freq: Once | INTRAMUSCULAR | Status: AC
  Administered 2019-10-11: 4 mg via INTRAVENOUS
  Filled 2019-10-11: qty 2

## 2019-10-11 MED ORDER — SODIUM CHLORIDE 0.9 % IV SOLN: 2.0000 g | Freq: Once | INTRAVENOUS | Status: DC

## 2019-10-11 MED ORDER — ASPIRIN EC 81 MG PO TBEC
81.0000 mg | DELAYED_RELEASE_TABLET | Freq: Every day | ORAL | Status: DC
  Administered 2019-10-12 – 2019-10-19 (×8): 81 mg via ORAL
  Filled 2019-10-11 (×8): qty 1

## 2019-10-11 MED ORDER — HEPARIN SODIUM (PORCINE) 5000 UNIT/ML IJ SOLN
5000.0000 [IU] | Freq: Three times a day (TID) | INTRAMUSCULAR | Status: DC
  Administered 2019-10-11 – 2019-10-19 (×23): 5000 [IU] via SUBCUTANEOUS
  Filled 2019-10-11 (×23): qty 1

## 2019-10-11 MED ORDER — POLYETHYLENE GLYCOL 3350 17 G PO PACK
17.0000 g | PACK | Freq: Every day | ORAL | Status: DC | PRN
  Administered 2019-10-13: 17 g via ORAL
  Filled 2019-10-11: qty 1

## 2019-10-11 MED ORDER — INSULIN ASPART 100 UNIT/ML ~~LOC~~ SOLN: 0.0000 [IU] | Freq: Every day | SUBCUTANEOUS | Status: DC

## 2019-10-11 MED ORDER — METRONIDAZOLE IN NACL 5-0.79 MG/ML-% IV SOLN: 500.0000 mg | Freq: Three times a day (TID) | INTRAVENOUS | Status: DC

## 2019-10-11 MED ORDER — CARVEDILOL 25 MG PO TABS
25.0000 mg | ORAL_TABLET | Freq: Two times a day (BID) | ORAL | Status: DC
  Administered 2019-10-12 – 2019-10-19 (×13): 25 mg via ORAL
  Filled 2019-10-11 (×14): qty 1

## 2019-10-11 NOTE — ED Provider Notes (Signed)
[facility] EMERGENCY DEPARTMENT Provider Note   CSN: [id number] Arrival date & time: 10/11/19  1022     History Chief Complaint  Patient presents with  . Fecal Impaction    Rebekah Johnson is a 59 y.o. female.  HPI    Patient with significant medical history of CHF, CAD, CKD stage V, dialysis M,W,F, hypertension, type 2 diabetes, right foot amputation done on 2017 by Dr. Sharol Given presents to the emergency department with chief complaint of left foot pain as well as constipation.  Patient states she has severe pain in her rectum as well as her foot.  Patient states she missed her dialysis treatment on Monday because she was in the hospital and only received partial of her dialysis today.  Patient was inconsolable during HPI and cannot provide much information.    After reviewing previous notes patient has been followed by Dr. Sharol Given of orthopedics who has been managing her transmetatarsal amputation, she completed a round of doxycycline for 3 weeks to treat a lateral ulcer.  She finished the course of antibiotics last month with wound improvement but there was concerned she was having worsening claudication in her left foot. She was Referred to vascular where they found she had severe limb ischemia.  She was brought to the OR on 09/20 where she received a stent of the distal popliteal artery by Dr Servando Snare.  She was placed on narcotics for pain management.  She then was seen in the emergency department on 10/04 for severe constipation and fecal disimpaction.  Fecal discompartment was performed under ketamine sedated due to severe pain, she was discharged home with MiraLAX.  Past Medical History:  Diagnosis Date  . Acute congestive heart failure ([place])   . Acute diastolic CHF (congestive heart failure) ([place]) 09/14/2016  . Atrophic vaginitis 06/07/2012  . Avitaminosis D 07/28/2012  . CAD (coronary artery disease)    a. NSTEM 09/2016: cath showing severe diffuse disease of the  RCA, ramus and Cx with mild-mod disease of LAD; PCI would require multiple stents and significant contrast usage thus medical therapy recommended.  . Cellulitis 07/12/2015  . Chest pain 01/01/2017  . Chronic diastolic CHF (congestive heart failure) ([place])   . Chronic kidney disease 09/23/2016  . CKD (chronic kidney disease), stage IV ([place])   . Constipation   . COPD GOLD  0 08/31/2017   Quit smoking 07/17/17 - Spirometry 09/01/2017  FEV1 1.23 (58%)  Ratio 57 with mild curvature p no rx - 09/01/2017  After extensive coaching inhaler device,  effectiveness =    75% with elipta with cough provoked  - PFT's  10/14/2017  FEV1 1.69 (68 % ) ratio 84  p no % improvement from saba p nothing prior to study with DLCO  50 % corrects to 76  % for alv volume   - 10/14/2017  Walked RA x 3 laps @ 1  . Current tobacco use 06/10/2012  . Diabetes mellitus with neuropathy ([place])   . Diabetic foot ulcer ([place]) 07/13/2015  . Diabetic polyneuropathy associated with type 2 diabetes mellitus ([place]) 08/19/2017  . DOE (dyspnea on exertion) 09/01/2017   09/01/2017  Walked RA x 3 laps @ 185 ft each stopped due to  End of study, nl to mod fast  pace, no  desat   But stopped to rest x 2  Spirometry 09/01/2017  FEV1 1.23 (58%)  Ratio 57  > trial of anoro    . Dyspnea    with exertion  .  ESRD (end stage renal disease) ([place])    MWF - [facility]  . Essential (primary) hypertension 06/10/2012  . Foot amputation status    a. h/o left foot transmetatarsal amputation  . Headache    Migraines  . High cholesterol   . History of biliary T-tube placement 06/10/2012   Patient unaware   . History of cardiac arrest   . HLD (hyperlipidemia) 09/02/2012   Overview:  ICD-10 cut over    . Hypertension    a. patent renal arteries by PV angio 08/2015. b. heavy proteinuria 09/2016 ? nephrotic.  [name] Hypertensive heart disease with heart failure ([place])   . Hypertriglyceridemia 07/28/2012  . Leg pain 06/10/2012  . Normocytic anemia    pt denies this dx  . NSTEMI  (non-ST elevated myocardial infarction) ([place]) 01/01/2017  . Obesity   . Onychomycosis 12/24/2015  . Peripheral nerve disease 09/02/2012   legs  . Peripheral vascular disease ([place]) 07/28/2012  . Pneumonia 2018  . Proteinuria   . PVD (peripheral vascular disease) ([place])    a. status post left SFA and popliteal stent and left SFA PTA with drug coated balloon in 08/2015.  . Status post transmetatarsal amputation of foot, left ([place]) 09/06/2015  . Tobacco abuse    quit cigarettes 2019, smokes marijuana  . Type 2 diabetes mellitus ([place]) 06/07/2012  . Type 2 diabetes mellitus with peripheral angiopathy ([place]) 09/02/2012    Patient Active Problem List   Diagnosis Date Noted  . Osteomyelitis of foot, acute ([place]) 10/11/2019  . Cutaneous abscess of left foot 10/04/2019  . Allergy, unspecified, initial encounter 08/07/2019  . Anaphylactic shock, unspecified, initial encounter 08/07/2019  . Constipation 08/04/2019  . Nausea without vomiting 08/04/2019  . Mild protein-calorie malnutrition ([place]) 11/29/2018  . Anemia in chronic kidney disease 10/04/2018  . Coagulation defect, unspecified ([place]) 10/04/2018  . Iron deficiency anemia, unspecified 10/04/2018  . Secondary hyperparathyroidism of renal origin ([place]) 10/04/2018  . Multinodular goiter 08/31/2018  . History of influenza 01/08/2018  . Cardiac arrest ([place]) 11/22/2017  . DOE (dyspnea on exertion) 09/01/2017  . COPD GOLD  0 08/31/2017  . Diabetic polyneuropathy associated with type 2 diabetes mellitus ([place]) 08/19/2017  . ESRD (end stage renal disease) on dialysis ([place]) 03/31/2017  . Neovascular glaucoma of right eye, moderate stage 03/31/2017  . NSTEMI (non-ST elevated myocardial infarction) ([place]) 01/01/2017  . Chest pain 01/01/2017  . Tobacco abuse   . PVD (peripheral vascular disease) ([place])   . Proteinuria   . Obesity   . Normocytic anemia   . Hypertension   . High cholesterol   . Foot amputation status   . Diabetes mellitus with neuropathy  ([place])   . CKD (chronic kidney disease), stage IV ([place])   . Chronic diastolic CHF (congestive heart failure) ([place])   . CAD (coronary artery disease) 10/27/2016  . Chronic kidney disease 09/23/2016  . Acute congestive heart failure ([place])   . Hypertensive heart disease with heart failure ([place])   . Chronic combined systolic and diastolic heart failure ([place]) 09/14/2016  . Onychomycosis 12/24/2015  . Status post transmetatarsal amputation of foot, left ([place]) 09/06/2015  . Diabetic foot ulcer ([place]) 07/13/2015  . Cellulitis 07/12/2015  . HLD (hyperlipidemia) 09/02/2012  . Peripheral nerve disease 09/02/2012  . Type 2 diabetes mellitus with peripheral angiopathy ([place]) 09/02/2012  . Hypertriglyceridemia 07/28/2012  . Peripheral vascular disease ([place]) 07/28/2012  . Avitaminosis D 07/28/2012  . Essential (primary) hypertension 06/10/2012  . History of biliary T-tube placement 06/10/2012  .  Leg pain 06/10/2012  . Current tobacco use 06/10/2012  . Atrophic vaginitis 06/07/2012  . Type 2 diabetes mellitus ([place]) 06/07/2012    Past Surgical History:  Procedure Laterality Date  . ABDOMINAL AORTOGRAM W/LOWER EXTREMITY Left 09/25/2019   Procedure: ABDOMINAL AORTOGRAM W/LOWER EXTREMITY;  Surgeon: Waynetta Sandy, MD;  Location: [facility];  Service: Cardiovascular;  Laterality: Left;  . ABDOMINAL HYSTERECTOMY    . AMPUTATION Left 09/06/2015   Procedure: LEFT TRANSMETATARSAL AMPUTATTION;  Surgeon: Newt Minion, MD;  Location: [facility];  Service: Orthopedics;  Laterality: Left;  . AV FISTULA PLACEMENT Left 12/13/2018   Procedure: ARTERIOVENOUS (AV) FISTULA CREATION LEFT UPPER ARM;  Surgeon: Waynetta Sandy, MD;  Location: [facility];  Service: Vascular;  Laterality: Left;  . BASCILIC VEIN TRANSPOSITION Left 03/30/2019   Procedure: LEFT ARM ARTERIOVENOUS VEIN TRANSPOSITION;  Surgeon: Serafina Mitchell, MD;  Location: [facility];  Service: Vascular;  Laterality: Left;  . COLONOSCOPY     polyps  x 1  . LEFT HEART CATH AND CORONARY ANGIOGRAPHY N/A 09/17/2016   Procedure: LEFT HEART CATH AND CORONARY ANGIOGRAPHY;  Surgeon: Jettie Booze, MD;  Location: [facility];  Service: Cardiovascular;  Laterality: N/A;  . PERIPHERAL VASCULAR ATHERECTOMY Left 09/25/2019   Procedure: PERIPHERAL VASCULAR ATHERECTOMY;  Surgeon: Waynetta Sandy, MD;  Location: [facility];  Service: Cardiovascular;  Laterality: Left;  SFA  . PERIPHERAL VASCULAR CATHETERIZATION N/A 08/16/2015   Procedure: Abdominal Aortogram;  Surgeon: Elam Dutch, MD;  Location: [facility];  Service: Cardiovascular;  Laterality: N/A;  . PERIPHERAL VASCULAR CATHETERIZATION Bilateral 08/16/2015   Procedure: Lower Extremity Angiography;  Surgeon: Elam Dutch, MD;  Location: [facility];  Service: Cardiovascular;  Laterality: Bilateral;  . PERIPHERAL VASCULAR CATHETERIZATION Left 08/16/2015   Procedure: Peripheral Vascular Intervention;  Surgeon: Elam Dutch, MD;  Location: [facility];  Service: Cardiovascular;  Laterality: Left;  SFA STENT X 2  . PERIPHERAL VASCULAR INTERVENTION Left 09/25/2019   Procedure: PERIPHERAL VASCULAR INTERVENTION;  Surgeon: Waynetta Sandy, MD;  Location: [facility];  Service: Cardiovascular;  Laterality: Left;  SFA     OB History   No obstetric history on file.     Family History  Problem Relation Age of Onset  . Diabetes Mother   . Hypertension Father   . Colon cancer Neg Hx   . Stomach cancer Neg Hx   . Pancreatic cancer Neg Hx     Social History   Tobacco Use  . Smoking status: Former Smoker    Packs/day: 1.00    Years: 42.00    Pack years: 42.00    Types: Cigarettes    Quit date: 07/17/2017    Years since quitting: 2.2  . Smokeless tobacco: Never Used  Vaping Use  . Vaping Use: Never used  Substance Use Topics  . Alcohol use: Not Currently    Alcohol/week: 1.0 - 2.0 standard drink    Types: 1 - 2 Glasses of  wine per week    Comment: on social occassions  . Drug use: Yes    Frequency: 7.0 times per week    Types: Marijuana    Comment: daily use - last use 03/28/19    Home Medications Prior to Admission medications   Medication Sig Start Date End Date Taking? Authorizing Provider  acetaminophen (TYLENOL) 500 MG tablet Take 1,000 mg by mouth every 6 (six) hours as needed for moderate pain  or headache.    [provider]  amLODipine (NORVASC) 5 MG tablet Take 1 tablet (5 mg total) by mouth daily. 08/22/19   Dorothy Spark, MD  Ascorbic Acid (VITAMIN C) 1000 MG tablet Take 1,000 mg by mouth daily.    [provider]  aspirin 81 MG EC tablet Take 1 tablet (81 mg total) by mouth daily. Reported on 07/13/2015 07/02/16   Charlott Rakes, MD  atorvastatin (LIPITOR) 80 MG tablet Take 1 tablet (80 mg total) by mouth daily. 10/07/17   Scot Jun, FNP  Blood Glucose Monitoring Suppl (ACCU-CHEK AVIVA) device Use as instructed three times daily. Patient taking differently: Use as instructed twice times daily. 04/22/16   Charlott Rakes, MD  carvedilol (COREG) 25 MG tablet Take 1 tablet (25 mg total) by mouth 2 (two) times daily. 10/07/17   Scot Jun, FNP  cholecalciferol (VITAMIN D3) 25 MCG (1000 UT) tablet Take 1,000 Units by mouth daily.    [provider]  clopidogrel (PLAVIX) 75 MG tablet Take 1 tablet (75 mg total) by mouth daily. 10/07/17   Scot Jun, FNP  docusate sodium (COLACE) 100 MG capsule Take 200 mg by mouth 2 (two) times daily.     [provider]  doxycycline (VIBRA-TABS) 100 MG tablet Take 1 tablet (100 mg total) by mouth daily. 09/28/19   Waynetta Sandy, MD  furosemide (LASIX) 80 MG tablet Take 80 mg by mouth 2 (two) times daily. 03/21/18   [provider]  gabapentin (NEURONTIN) 300 MG capsule Take 1 capsule (300 mg total) by mouth 3 (three) times daily. 10/07/17   Scot Jun, FNP  glipiZIDE (GLUCOTROL) 5 MG  tablet Take 0.5 tablets (2.5 mg total) by mouth daily before breakfast. Patient taking differently: Take 5 mg by mouth daily before breakfast.  11/01/18   Renato Shin, MD  glucose blood (ACCU-CHEK AVIVA) test strip Use as instructed three times daily before meals. 10/14/17   Charlott Rakes, MD  hydrALAZINE (APRESOLINE) 50 MG tablet Take 50 mg by mouth 3 (three) times daily.    [provider]  HYDROcodone-acetaminophen (NORCO/VICODIN) 5-325 MG tablet Take 1 tablet by mouth every 6 (six) hours as needed for moderate pain. 08/08/19   Persons, Bevely Palmer, PA  isosorbide mononitrate (IMDUR) 30 MG 24 hr tablet Take 60 mg by mouth daily. 03/22/18   [provider]  lactulose (CHRONULAC) 10 GM/15ML solution Take 15 mLs (10 g total) by mouth 3 (three) times daily. 03/21/19   Lawyer, Harrell Gave, PA-C  Lancet Devices [facility]) lancets Use as instructed three times daily before meals. 04/22/16   Charlott Rakes, MD  latanoprost (XALATAN) 0.005 % ophthalmic solution Place 1 drop into both eyes at bedtime.     [provider]  linaclotide Rolan Lipa) 145 MCG CAPS capsule Take 1 capsule (145 mcg total) by mouth daily before breakfast. Take ~ 30 minutes before breakfast, on an empty stomach 10/09/19   Sponseller, Rebekah R, PA-C  nitroGLYCERIN (NITRODUR - DOSED IN MG/24 HR) 0.2 mg/hr patch Place 1 patch (0.2 mg total) onto the skin daily. 08/15/19   Newt Minion, MD  nitroGLYCERIN (NITROSTAT) 0.4 MG SL tablet Place 1 tablet (0.4 mg total) under the tongue every 5 (five) minutes as needed for chest pain. 01/18/17 03/21/19  Dorothy Spark, MD  Omega-3 Fatty Acids (FISH OIL) 1000 MG CAPS Take 1,000 mg by mouth daily.     [provider]  oxyCODONE-acetaminophen (PERCOCET/ROXICET) 5-325 MG tablet Take  1 tablet by mouth every 4 (four) hours as needed for severe pain. 09/15/19   Waynetta Sandy, MD  ranolazine (RANEXA) 500 MG 12 hr tablet Take 1 tablet by mouth twice  daily 07/04/19   Dorothy Spark, MD  sevelamer carbonate (RENVELA) 800 MG tablet Take 800 mg by mouth See admin instructions. Three times daily with meals and with snack.    [provider]  silver sulfADIAZINE (SILVADENE) 1 % cream Apply 1 application topically daily. Apply to affected area daily plus dry dressing Patient taking differently: Apply 1 application topically daily as needed (wound care).  07/11/18   Newt Minion, MD  sitaGLIPtin (JANUVIA) 25 MG tablet Take 1 tablet (25 mg total) by mouth daily. 10/07/17   Scot Jun, FNP  ZOFRAN 4 MG tablet Take 4 mg by mouth every 8 (eight) hours as needed for nausea/vomiting. 03/08/19   [provider]    Allergies    Patient has no known allergies.  Review of Systems   Review of Systems  Constitutional: Negative for chills and fever.  HENT: Negative for congestion.   Respiratory: Negative for shortness of breath.   Cardiovascular: Negative for chest pain.  Gastrointestinal: Negative for abdominal pain.  Genitourinary: Negative for enuresis.  Musculoskeletal: Negative for back pain.  Skin: Negative for rash.  Neurological: Negative for dizziness.  Hematological: Does not bruise/bleed easily.    Physical Exam Updated Vital Signs BP (!) 161/88   Pulse 76   Temp 98.2 F (36.8 C) (Oral)   Resp 16   Ht 5\' 4"  (1.626 m)   Wt 72.6 kg   LMP  (LMP Unknown)   SpO2 99%   BMI 27.46 kg/m   Physical Exam Vitals and nursing note reviewed.  Constitutional:      General: She is in acute distress.     Appearance: She is not ill-appearing.  HENT:     Head: Normocephalic and atraumatic.     Nose: No congestion.     Mouth/Throat:     Mouth: Mucous membranes are moist.     Pharynx: Oropharynx is clear.  Eyes:     General: No scleral icterus. Cardiovascular:     Rate and Rhythm: Normal rate and regular rhythm.     Pulses: Normal pulses.     Heart sounds: No murmur heard.  No friction rub. No gallop.    Pulmonary:     Effort: No respiratory distress.     Breath sounds: No wheezing, rhonchi or rales.  Abdominal:     General: There is distension.     Palpations: There is no mass.     Tenderness: There is abdominal tenderness. There is no right CVA tenderness, left CVA tenderness or guarding.     Hernia: No hernia is present.     Comments: Patient abdomen was visualized, it was distended, normoactive bowel sounds, dull to percussion, tenderness to palpation diffusely, no peritoneal sign no acute abdomen noted on exam.  Musculoskeletal:        General: Swelling, tenderness, deformity and signs of injury present.     Comments: Patient's left foot was visualized, metatarsal amputation was present, there is a 6 cm x 4 cm ulcer noted on the distal lateral aspect of the patient's fifth metatarsal.  It had surrounding erythema, no drainage, discharge noted on exam.  It was warm to the touch, fluctuance present as well as induration.  Patient's neurovascular was fully intact.  Skin:    General: Skin is  warm and dry.     Findings: No rash.  Neurological:     Mental Status: She is alert.  Psychiatric:        Mood and Affect: Mood normal.        ED Results / Procedures / Treatments   Labs (all labs ordered are listed, but only abnormal results are displayed) Labs Reviewed  CBC - Abnormal; Notable for the following components:      Result Value   WBC 18.1 (*)    RDW 16.1 (*)    Platelets 454 (*)    All other components within normal limits  COMPREHENSIVE METABOLIC PANEL - Abnormal; Notable for the following components:   Chloride 96 (*)    Glucose, Bld 126 (*)    Creatinine, Ser 3.66 (*)    Calcium 8.8 (*)    Total Protein 8.3 (*)    Albumin 2.9 (*)    GFR calc non Af Amer 13 (*)    Anion gap 16 (*)    All other components within normal limits  LACTIC ACID, PLASMA - Abnormal; Notable for the following components:   Lactic Acid, Venous 4.0 (*)    All other components within normal  limits  CULTURE, BLOOD (ROUTINE X 2)  CULTURE, BLOOD (ROUTINE X 2)  RESP PANEL BY RT PCR (RSV, FLU A&B, COVID)  LACTIC ACID, PLASMA  LIPASE, BLOOD  HIV ANTIBODY (ROUTINE TESTING W REFLEX)  CBC  CREATININE, SERUM  COMPREHENSIVE METABOLIC PANEL  CBC    EKG EKG Interpretation  Date/Time:  Wednesday October 11 2019 12:18:04 EDT Ventricular Rate:  70 PR Interval:    QRS Duration: 105 QT Interval:  422 QTC Calculation: 456 R Axis:   59 Text Interpretation: Age not entered, assumed to be  59 years old for purpose of ECG interpretation Sinus rhythm Ventricular premature complex ST-t wave abnormality Abnormal ECG Confirmed by Carmin Muskrat [phone]) on 10/11/2019 12:38:23 PM   Radiology CT Abdomen Pelvis W Contrast  Result Date: 10/11/2019 CLINICAL DATA:  Abdominal and rectal pain. EXAM: CT ABDOMEN AND PELVIS WITH CONTRAST TECHNIQUE: Multidetector CT imaging of the abdomen and pelvis was performed using the standard protocol following bolus administration of intravenous contrast. CONTRAST:  75 mL OMNIPAQUE IOHEXOL 300 MG/ML  SOLN COMPARISON:  CT abdomen and pelvis 03/21/2019. FINDINGS: Lower chest: Lung bases clear. No pleural or pericardial effusion. Extensive calcific coronary atherosclerosis noted. Hepatobiliary: No focal liver abnormality is seen. No gallstones, gallbladder wall thickening, or biliary dilatation. Pancreas: Unremarkable. No pancreatic ductal dilatation or surrounding inflammatory changes. Spleen: Normal in size without focal abnormality. Adrenals/Urinary Tract: Adrenal glands are unremarkable. Kidneys are normal, without renal calculi, focal lesion, or hydronephrosis. Bladder is unremarkable. Stomach/Bowel: There is a large volume of stool in the rectosigmoid colon. Walls of the rectum are thickened without focal abnormality. No pneumatosis, free air or portal venous gas. The stomach, small bowel and appendix appear normal. Vascular/Lymphatic: Aortic atherosclerosis. No  enlarged abdominal or pelvic lymph nodes. Reproductive: Status post hysterectomy. No adnexal masses. Other: Negative. Musculoskeletal: No acute or focal bony abnormality. IMPRESSION: Findings compatible with proctitis. Large volume of stool in the rectosigmoid colon noted. Aortic Atherosclerosis (ICD10-I70.0). Extensive calcific coronary atherosclerosis also noted. Electronically Signed   By: Inge Rise M.D.   On: 10/11/2019 14:36   DG Foot 2 Views Left  Result Date: 10/11/2019 CLINICAL DATA:  Lateral left foot ulcer, pain EXAM: LEFT FOOT - 2 VIEW COMPARISON:  07/07/2017 FINDINGS: Patient is status post transmetatarsal amputation of the  left first-fifth rays. Subtle cortical erosion of the proximal aspect of the fifth metatarsal base where there is focal osteopenia. There is soft tissue swelling and air within the adjacent lateral and plantar soft tissues suggesting ulceration. No additional sites of bony erosion are identified. Prominent vascular calcifications. IMPRESSION: 1. Subtle cortical erosion of the proximal aspect of the fifth metatarsal base highly suspicious for acute osteomyelitis. 2. Soft tissue swelling and air within the adjacent lateral and plantar soft tissues suggesting ulceration. Soft tissue infection with a gas-forming organism is not excluded. Electronically Signed   By: Davina Poke D.O.   On: 10/11/2019 13:11    Procedures Procedures (including critical care time)  Medications Ordered in ED Medications  cefTRIAXone (ROCEPHIN) 2 g in sodium chloride 0.9 % 100 mL IVPB (has no administration in time range)  vancomycin (VANCOREADY) IVPB 750 mg/150 mL (has no administration in time range)  heparin injection 5,000 Units (has no administration in time range)  fentaNYL (SUBLIMAZE) injection 75 mcg (75 mcg Intravenous Given 10/11/19 1144)  ondansetron (ZOFRAN) injection 4 mg (4 mg Intravenous Given 10/11/19 1143)  LORazepam (ATIVAN) injection 1 mg (1 mg Intravenous Given  10/11/19 1143)  vancomycin (VANCOREADY) IVPB 1500 mg/300 mL (0 mg Intravenous Stopped 10/11/19 1714)  sodium chloride 0.9 % bolus 500 mL (0 mLs Intravenous Stopped 10/11/19 1534)  iohexol (OMNIPAQUE) 300 MG/ML solution 75 mL (75 mLs Intravenous Contrast Given 10/11/19 1427)    ED Course  I have reviewed the triage vital signs and the nursing notes.  Pertinent labs & imaging results that were available during my care of the patient were reviewed by me and considered in my medical decision making (see chart for details).    MDM Rules/Calculators/A&P                          Patient presents to the emergency department with chief complaint of constipation as well as left foot pain.  She was alert, appeared to be in acute distress, vital signs significant for tachycardia.  Due to extreme pain will  pain start patient on Ativan as well as fentanyl for pain management.  Will obtain screening labs as well as CT for further evaluation.  CBC showing worsening leukocytosis 18.1 from 14 2 days ago, no signs of anemia.  CMP showing hyperglycemia of 126, creatinine 3.6, calcium 8.8, albumin 2.9 with an anion gap of 16.  Patient has a lactic of 4.0, lipase of 50. X-ray of left foot shows subtle cortical erosion of the proximal aspect of the fifth metatarsal base suspicious for acute osteomyelitis, soft tissue swelling and air within the adjacent lateral soft tissues suggesting ulceration but cannot exclude gas-forming organism.  Will obtain blood cultures, start patient on IV antibiotics for osteomyelitis and start patient on fluids.  Patient was reassessed after providing pain medication patient states she is feeling much better, tachycardia has resolved, vital signs have remained stable.  Will send patient down for CT abdomen pelvis for further evaluation.  Abdominal CT shows proctitis with large volume of stool in rectosigmoid noted.  Due to need of sedation for fecal disimpaction during last visit will  consult general surgery for further recommendation evaluation.  Spoke with [place] with general surgery who feels this would not not require surgical intervention at the moment but will speak with her attending about the case.  Due to acute osteomyelitis will speak with orthopedic surgery for further evaluation.  Spoke with Hilbert Odor who  states they will consult on the patient once admitted to medicine for further evaluation.  Will speak to hospitalist for medical admission due to osteomyelitis. Spoke with Dr. Nevada Crane who has accept the patient and will come and evaluate the patient.  I have low suspicion for systemic infection as patient is nontoxic-appearing, vital signs reassuring.  I suspect initial tachycardia was secondary to pain as she was in a lot of pain due to her constipation, tachycardia resolved once pain was under control.  She has an elevated white count and lactic but she was afebrile, nontachycardic, nontachypneic did not meet SIRS criteria.  Patient's lactate improved from 4 down to 1 after providing fluids.  Low suspicion for intra-abdominal abnormality requiring surgical intervention as patient was tolerating p.o., she denies nausea or vomiting, consulted with general surgery who does not feel she needs to be brought to the OR.  Low suspicion for emergent hemodialysis as there is no severe electrolyte derailments, no signs of fluid overload noted on exam.  I suspect patient suffering from 2 separate issues, I suspect patient's  large stool burden is secondary to opioid use and will need continuous laxatives and enemas.  patient's elevated white count is most likely secondary to her osteomyelitis in her left foot.  Anticipate she will need continued IV antibiotics to address her infection.  Patient signs remained stable, she appears to be resting calmly bed patient care be transferred to hospitalist team for further evaluation. Final Clinical Impression(s) / ED Diagnoses Final  diagnoses:  Proctitis  Other osteomyelitis of left foot [facility])    Rx / DC Orders ED Discharge Orders    None       Marcello Fennel, PA-C 10/11/19 1717    Carmin Muskrat, MD 10/16/19 [phone]

## 2019-10-11 NOTE — Progress Notes (Signed)
Pt directed to ED today for concern of worsened L stump wound -- infected on exam. She is afebrile. Ceftazidime 2g given @ HD this morning. Would recommend ortho (Dr. Sharol Given) to evaluate her.  Also with ongoing rectal pain/?constipation s/p disempaction in ED 2 days ago.  Veneta Penton, PA-C [employer] Pager [phone]

## 2019-10-11 NOTE — H&P (Signed)
History and Physical  [name] XBD:[id number] DOB: 07-Jul-1960 DOA: 10/11/2019  Referring physician: Ileene Patrick  PCP: Patient, No Pcp Per  Outpatient Specialists: Nephrology Patient coming from: Home. Chief Complaint:   HPI: Rebekah Johnson is a 59 y.o. female with medical history significant for ESRD on HD MWF, essential hypertension, type 2 diabetes, transmetatarsal left foot amputation on 2017 by Dr. Sharol Given who presented to [facility] ED with complaints of left foot pain, abdominal pain and constipation.  Endorses severe pain in her rectum.  History was limited due to her severe rectal pain.  She completed her 3 weeks course of doxycycline about a month ago with improvement of her left transmetatarsal wound.  Was referred to vascular surgery where she was found to have severe limb ischemia.  Was taken to the OR on 09/25/2019 where she received a stent of her distal popliteal artery by Dr. Donzetta Matters.  Was on home opiates for severe pain and presented to the ED on 10/09/2019 for severe constipation.  She was disimpacted under sedation and discharged home with bowel regimen.  She returns today because of recurrent constipation, severe rectal pain and worsening left foot pain.  Work-up in the ED revealed proctitis seen on CT scan, left foot osteomyelitis on x-ray.  TRH asked to admit.  ED Course: Hypertensive, lab studies remarkable for leukocytosis with WBC 18.1K.  CT abdomen pelvis with contrast showing:  Findings compatible with proctitis. Large volume of stool in the rectosigmoid colon noted.  Aortic Atherosclerosis (ICD10-I70.0). Extensive calcific coronary atherosclerosis also noted.  Review of Systems: Review of systems as noted in the HPI. All other systems reviewed and are negative.   Past Medical History:  Diagnosis Date  . Acute congestive heart failure ([place])   . Acute diastolic CHF (congestive heart failure) ([place]) 09/14/2016  . Atrophic vaginitis 06/07/2012  . Avitaminosis D 07/28/2012  . CAD  (coronary artery disease)    a. NSTEM 09/2016: cath showing severe diffuse disease of the RCA, ramus and Cx with mild-mod disease of LAD; PCI would require multiple stents and significant contrast usage thus medical therapy recommended.  . Cellulitis 07/12/2015  . Chest pain 01/01/2017  . Chronic diastolic CHF (congestive heart failure) ([place])   . Chronic kidney disease 09/23/2016  . CKD (chronic kidney disease), stage IV ([place])   . Constipation   . COPD GOLD  0 08/31/2017   Quit smoking 07/17/17 - Spirometry 09/01/2017  FEV1 1.23 (58%)  Ratio 57 with mild curvature p no rx - 09/01/2017  After extensive coaching inhaler device,  effectiveness =    75% with elipta with cough provoked  - PFT's  10/14/2017  FEV1 1.69 (68 % ) ratio 84  p no % improvement from saba p nothing prior to study with DLCO  50 % corrects to 76  % for alv volume   - 10/14/2017  Walked RA x 3 laps @ 1  . Current tobacco use 06/10/2012  . Diabetes mellitus with neuropathy ([place])   . Diabetic foot ulcer ([place]) 07/13/2015  . Diabetic polyneuropathy associated with type 2 diabetes mellitus ([place]) 08/19/2017  . DOE (dyspnea on exertion) 09/01/2017   09/01/2017  Walked RA x 3 laps @ 185 ft each stopped due to  End of study, nl to mod fast  pace, no  desat   But stopped to rest x 2  Spirometry 09/01/2017  FEV1 1.23 (58%)  Ratio 57  > trial of anoro    . Dyspnea    with exertion  .  ESRD (end stage renal disease) ([place])    MWF - [facility]  . Essential (primary) hypertension 06/10/2012  . Foot amputation status    a. h/o left foot transmetatarsal amputation  . Headache    Migraines  . High cholesterol   . History of biliary T-tube placement 06/10/2012   Patient unaware   . History of cardiac arrest   . HLD (hyperlipidemia) 09/02/2012   Overview:  ICD-10 cut over    . Hypertension    a. patent renal arteries by PV angio 08/2015. b. heavy proteinuria 09/2016 ? nephrotic.  [name] Hypertensive heart disease with heart failure ([place])   .  Hypertriglyceridemia 07/28/2012  . Leg pain 06/10/2012  . Normocytic anemia    pt denies this dx  . NSTEMI (non-ST elevated myocardial infarction) ([place]) 01/01/2017  . Obesity   . Onychomycosis 12/24/2015  . Peripheral nerve disease 09/02/2012   legs  . Peripheral vascular disease ([place]) 07/28/2012  . Pneumonia 2018  . Proteinuria   . PVD (peripheral vascular disease) ([place])    a. status post left SFA and popliteal stent and left SFA PTA with drug coated balloon in 08/2015.  . Status post transmetatarsal amputation of foot, left ([place]) 09/06/2015  . Tobacco abuse    quit cigarettes 2019, smokes marijuana  . Type 2 diabetes mellitus ([place]) 06/07/2012  . Type 2 diabetes mellitus with peripheral angiopathy ([place]) 09/02/2012   Past Surgical History:  Procedure Laterality Date  . ABDOMINAL AORTOGRAM W/LOWER EXTREMITY Left 09/25/2019   Procedure: ABDOMINAL AORTOGRAM W/LOWER EXTREMITY;  Surgeon: Waynetta Sandy, MD;  Location: [facility];  Service: Cardiovascular;  Laterality: Left;  . ABDOMINAL HYSTERECTOMY    . AMPUTATION Left 09/06/2015   Procedure: LEFT TRANSMETATARSAL AMPUTATTION;  Surgeon: Newt Minion, MD;  Location: [facility];  Service: Orthopedics;  Laterality: Left;  . AV FISTULA PLACEMENT Left 12/13/2018   Procedure: ARTERIOVENOUS (AV) FISTULA CREATION LEFT UPPER ARM;  Surgeon: Waynetta Sandy, MD;  Location: [facility];  Service: Vascular;  Laterality: Left;  . BASCILIC VEIN TRANSPOSITION Left 03/30/2019   Procedure: LEFT ARM ARTERIOVENOUS VEIN TRANSPOSITION;  Surgeon: Serafina Mitchell, MD;  Location: [facility];  Service: Vascular;  Laterality: Left;  . COLONOSCOPY     polyps x 1  . LEFT HEART CATH AND CORONARY ANGIOGRAPHY N/A 09/17/2016   Procedure: LEFT HEART CATH AND CORONARY ANGIOGRAPHY;  Surgeon: Jettie Booze, MD;  Location: [facility];  Service: Cardiovascular;  Laterality: N/A;  . PERIPHERAL VASCULAR ATHERECTOMY Left 09/25/2019   Procedure: PERIPHERAL VASCULAR  ATHERECTOMY;  Surgeon: Waynetta Sandy, MD;  Location: [facility];  Service: Cardiovascular;  Laterality: Left;  SFA  . PERIPHERAL VASCULAR CATHETERIZATION N/A 08/16/2015   Procedure: Abdominal Aortogram;  Surgeon: Elam Dutch, MD;  Location: [facility];  Service: Cardiovascular;  Laterality: N/A;  . PERIPHERAL VASCULAR CATHETERIZATION Bilateral 08/16/2015   Procedure: Lower Extremity Angiography;  Surgeon: Elam Dutch, MD;  Location: [facility];  Service: Cardiovascular;  Laterality: Bilateral;  . PERIPHERAL VASCULAR CATHETERIZATION Left 08/16/2015   Procedure: Peripheral Vascular Intervention;  Surgeon: Elam Dutch, MD;  Location: [facility];  Service: Cardiovascular;  Laterality: Left;  SFA STENT X 2  . PERIPHERAL VASCULAR INTERVENTION Left 09/25/2019   Procedure: PERIPHERAL VASCULAR INTERVENTION;  Surgeon: Waynetta Sandy, MD;  Location: [facility];  Service: Cardiovascular;  Laterality: Left;  SFA    Social History:  reports that she  quit smoking about 2 years ago. Her smoking use included cigarettes. She has a 42.00 pack-year smoking history. She has never used smokeless tobacco. She reports previous alcohol use of about 1.0 - 2.0 standard drink of alcohol per week. She reports current drug use. Frequency: 7.00 times per week. Drug: Marijuana.   No Known Allergies  Family History  Problem Relation Age of Onset  . Diabetes Mother   . Hypertension Father   . Colon cancer Neg Hx   . Stomach cancer Neg Hx   . Pancreatic cancer Neg Hx       Prior to Admission medications   Medication Sig Start Date End Date Taking? Authorizing Provider  acetaminophen (TYLENOL) 500 MG tablet Take 1,000 mg by mouth every 6 (six) hours as needed for moderate pain or headache.    [provider]  amLODipine (NORVASC) 5 MG tablet Take 1 tablet (5 mg total) by mouth daily. 08/22/19   Dorothy Spark, MD  Ascorbic Acid (VITAMIN  C) 1000 MG tablet Take 1,000 mg by mouth daily.    [provider]  aspirin 81 MG EC tablet Take 1 tablet (81 mg total) by mouth daily. Reported on 07/13/2015 07/02/16   Charlott Rakes, MD  atorvastatin (LIPITOR) 80 MG tablet Take 1 tablet (80 mg total) by mouth daily. 10/07/17   Scot Jun, FNP  Blood Glucose Monitoring Suppl (ACCU-CHEK AVIVA) device Use as instructed three times daily. Patient taking differently: Use as instructed twice times daily. 04/22/16   Charlott Rakes, MD  carvedilol (COREG) 25 MG tablet Take 1 tablet (25 mg total) by mouth 2 (two) times daily. 10/07/17   Scot Jun, FNP  cholecalciferol (VITAMIN D3) 25 MCG (1000 UT) tablet Take 1,000 Units by mouth daily.    [provider]  clopidogrel (PLAVIX) 75 MG tablet Take 1 tablet (75 mg total) by mouth daily. 10/07/17   Scot Jun, FNP  docusate sodium (COLACE) 100 MG capsule Take 200 mg by mouth 2 (two) times daily.     [provider]  doxycycline (VIBRA-TABS) 100 MG tablet Take 1 tablet (100 mg total) by mouth daily. 09/28/19   Waynetta Sandy, MD  furosemide (LASIX) 80 MG tablet Take 80 mg by mouth 2 (two) times daily. 03/21/18   [provider]  gabapentin (NEURONTIN) 300 MG capsule Take 1 capsule (300 mg total) by mouth 3 (three) times daily. 10/07/17   Scot Jun, FNP  glipiZIDE (GLUCOTROL) 5 MG tablet Take 0.5 tablets (2.5 mg total) by mouth daily before breakfast. Patient taking differently: Take 5 mg by mouth daily before breakfast.  11/01/18   Renato Shin, MD  glucose blood (ACCU-CHEK AVIVA) test strip Use as instructed three times daily before meals. 10/14/17   Charlott Rakes, MD  hydrALAZINE (APRESOLINE) 50 MG tablet Take 50 mg by mouth 3 (three) times daily.    [provider]  HYDROcodone-acetaminophen (NORCO/VICODIN) 5-325 MG tablet Take 1 tablet by mouth every 6 (six) hours as needed for moderate pain. 08/08/19   Persons, Bevely Palmer, PA   isosorbide mononitrate (IMDUR) 30 MG 24 hr tablet Take 60 mg by mouth daily. 03/22/18   [provider]  lactulose (CHRONULAC) 10 GM/15ML solution Take 15 mLs (10 g total) by mouth 3 (three) times daily. 03/21/19   Lawyer, Harrell Gave, PA-C  Lancet Devices [facility]) lancets Use as instructed three times daily before meals. 04/22/16   Charlott Rakes, MD  latanoprost (XALATAN) 0.005 % ophthalmic solution Place  1 drop into both eyes at bedtime.     [provider]  linaclotide Rolan Lipa) 145 MCG CAPS capsule Take 1 capsule (145 mcg total) by mouth daily before breakfast. Take ~ 30 minutes before breakfast, on an empty stomach 10/09/19   Sponseller, Rebekah R, PA-C  nitroGLYCERIN (NITRODUR - DOSED IN MG/24 HR) 0.2 mg/hr patch Place 1 patch (0.2 mg total) onto the skin daily. 08/15/19   Newt Minion, MD  nitroGLYCERIN (NITROSTAT) 0.4 MG SL tablet Place 1 tablet (0.4 mg total) under the tongue every 5 (five) minutes as needed for chest pain. 01/18/17 03/21/19  Dorothy Spark, MD  Omega-3 Fatty Acids (FISH OIL) 1000 MG CAPS Take 1,000 mg by mouth daily.     [provider]  oxyCODONE-acetaminophen (PERCOCET/ROXICET) 5-325 MG tablet Take 1 tablet by mouth every 4 (four) hours as needed for severe pain. 09/15/19   Waynetta Sandy, MD  ranolazine (RANEXA) 500 MG 12 hr tablet Take 1 tablet by mouth twice daily 07/04/19   Dorothy Spark, MD  sevelamer carbonate (RENVELA) 800 MG tablet Take 800 mg by mouth See admin instructions. Three times daily with meals and with snack.    [provider]  silver sulfADIAZINE (SILVADENE) 1 % cream Apply 1 application topically daily. Apply to affected area daily plus dry dressing Patient taking differently: Apply 1 application topically daily as needed (wound care).  07/11/18   Newt Minion, MD  sitaGLIPtin (JANUVIA) 25 MG tablet Take 1 tablet (25 mg total) by mouth daily. 10/07/17   Scot Jun, FNP  ZOFRAN 4 MG  tablet Take 4 mg by mouth every 8 (eight) hours as needed for nausea/vomiting. 03/08/19   [provider]    Physical Exam: BP (!) 161/88   Pulse 76   Temp 98.2 F (36.8 C) (Oral)   Resp 16   Ht $R'5\' 4"'HR$  (1.626 m)   Wt 72.6 kg   LMP  (LMP Unknown)   SpO2 99%   BMI 27.46 kg/m   . General: 59 y.o. year-old female well developed well nourished in no acute distress.  Alert and oriented x3.  Very uncomfortable due to rectal pain. . Cardiovascular: Regular rate and rhythm with no rubs or gallops.  No thyromegaly or JVD noted.  No lower extremity edema. 2/4 pulses in all 4 extremities. [name] Respiratory: Clear to auscultation with no wheezes or rales. Good inspiratory effort. . Abdomen: Soft nontender nondistended with normal bowel sounds x4 quadrants. . Muskuloskeletal: No cyanosis, clubbing or edema noted bilaterally . Neuro: CN II-XII intact, strength, sensation, reflexes . Skin: No ulcerative lesions noted or rashes . Psychiatry: Judgement and insight appear normal. Mood is appropriate for condition and setting          Labs on Admission:  Basic Metabolic Panel: Recent Labs  Lab 10/09/19 0717 10/11/19 1143  NA 134* 139  K 3.9 4.2  CL 97* 96*  CO2 22 27  GLUCOSE 103* 126*  BUN 51* 22  CREATININE 6.53* 3.66*  CALCIUM 8.8* 8.8*   Liver Function Tests: Recent Labs  Lab 10/11/19 1143  AST 23  ALT 13  ALKPHOS 112  BILITOT 0.4  PROT 8.3*  ALBUMIN 2.9*   Recent Labs  Lab 10/11/19 1143  LIPASE 50   No results for input(s): AMMONIA in the last 168 hours. CBC: Recent Labs  Lab 10/09/19 0717 10/11/19 1130  WBC 14.8* 18.1*  NEUTROABS 11.0*  --   HGB 11.2* 12.3  HCT 36.4  39.7  MCV 90.8 89.8  PLT 380 454*   Cardiac Enzymes: No results for input(s): CKTOTAL, CKMB, CKMBINDEX, TROPONINI in the last 168 hours.  BNP (last 3 results) No results for input(s): BNP in the last 8760 hours.  ProBNP (last 3 results) No results for input(s): PROBNP in the last 8760  hours.  CBG: No results for input(s): GLUCAP in the last 168 hours.  Radiological Exams on Admission: CT Abdomen Pelvis W Contrast  Result Date: 10/11/2019 CLINICAL DATA:  Abdominal and rectal pain. EXAM: CT ABDOMEN AND PELVIS WITH CONTRAST TECHNIQUE: Multidetector CT imaging of the abdomen and pelvis was performed using the standard protocol following bolus administration of intravenous contrast. CONTRAST:  75 mL OMNIPAQUE IOHEXOL 300 MG/ML  SOLN COMPARISON:  CT abdomen and pelvis 03/21/2019. FINDINGS: Lower chest: Lung bases clear. No pleural or pericardial effusion. Extensive calcific coronary atherosclerosis noted. Hepatobiliary: No focal liver abnormality is seen. No gallstones, gallbladder wall thickening, or biliary dilatation. Pancreas: Unremarkable. No pancreatic ductal dilatation or surrounding inflammatory changes. Spleen: Normal in size without focal abnormality. Adrenals/Urinary Tract: Adrenal glands are unremarkable. Kidneys are normal, without renal calculi, focal lesion, or hydronephrosis. Bladder is unremarkable. Stomach/Bowel: There is a large volume of stool in the rectosigmoid colon. Walls of the rectum are thickened without focal abnormality. No pneumatosis, free air or portal venous gas. The stomach, small bowel and appendix appear normal. Vascular/Lymphatic: Aortic atherosclerosis. No enlarged abdominal or pelvic lymph nodes. Reproductive: Status post hysterectomy. No adnexal masses. Other: Negative. Musculoskeletal: No acute or focal bony abnormality. IMPRESSION: Findings compatible with proctitis. Large volume of stool in the rectosigmoid colon noted. Aortic Atherosclerosis (ICD10-I70.0). Extensive calcific coronary atherosclerosis also noted. Electronically Signed   By: Inge Rise M.D.   On: 10/11/2019 14:36   DG Foot 2 Views Left  Result Date: 10/11/2019 CLINICAL DATA:  Lateral left foot ulcer, pain EXAM: LEFT FOOT - 2 VIEW COMPARISON:  07/07/2017 FINDINGS: Patient is  status post transmetatarsal amputation of the left first-fifth rays. Subtle cortical erosion of the proximal aspect of the fifth metatarsal base where there is focal osteopenia. There is soft tissue swelling and air within the adjacent lateral and plantar soft tissues suggesting ulceration. No additional sites of bony erosion are identified. Prominent vascular calcifications. IMPRESSION: 1. Subtle cortical erosion of the proximal aspect of the fifth metatarsal base highly suspicious for acute osteomyelitis. 2. Soft tissue swelling and air within the adjacent lateral and plantar soft tissues suggesting ulceration. Soft tissue infection with a gas-forming organism is not excluded. Electronically Signed   By: Davina Poke D.O.   On: 10/11/2019 13:11    EKG: I independently viewed the EKG done and my findings are as followed: Sinus rhythm rate of 70.  Assessment/Plan Present on Admission: . Osteomyelitis of foot, acute ([place])  Active Problems:   Osteomyelitis of foot, acute ([facility])  Left foot osteomyelitis seen on x-ray, POA Had a transmetatarsal amputation in 2017 by Dr. Sharol Given Recently completed course of doxycycline x3 weeks outpatient Seen by vascular surgery with diagnosis of severe limb ischemia Orthopedic surgery has been consulted by EDP and is following. Continue IV antibiotics started in the ED, IV vancomycin and Rocephin, added IV Flagyl Pain control and bowel regimen Obtain CRP and ESR  Proctitis on CT scan General surgery has been consulted by EDP Continue empiric IV antibiotics and pain control Follow blood cultures. Monitor WBC and fever curve. Repeat CBC with differentials in the morning  Peripheral vascular disease/coronary artery disease Hold Plavix until  seen by surgery Continue home aspirin and statin  Type 2 diabetes with hyperglycemia Obtain hemoglobin A1c Insulin sliding scale Avoid hypoglycemia in the setting of ESRD  Chronic constipation likely opiate  induced Continue bowel regimen  ESRD on HD MWF Missed hemodialysis on Monday and had partial dialysis on Wednesday, 10/11/2019 Consult nephrology in the morning No evidence of volume overload on exam Renal diet with fluid restriction  Essential hypertension Resume home regimen Monitor vital signs  Chronic diastolic CHF Last 2D echo done on 05/15/2019 showed normal LVEF with grade 1 diastolic dysfunction Euvolemic on exam Resume cardiac medications Strict I's and O's and daily weight  DVT prophylaxis: Subcu heparin 3 times daily  Code Status: Full code as stated by the patient herself  Family Communication: None at bedside  Disposition Plan: Admit to telemetry medical  Consults called: Orthopedic surgery and general surgery consulted by EDP.  Consult nephrology in the morning  Admission status: Inpatient status   Status is: Inpatient    Dispo:  Patient From: Home  Planned Disposition: Home with Health Care Svc  Expected discharge date: 10/13/19  Medically stable for discharge: No, ongoing management of proctitis and constipation.        Kayleen Memos MD [facility] Pager [phone]  If 7PM-7AM, please contact night-coverage [email or web address] Password [email or web address]  10/11/2019, 5:03 PM

## 2019-10-11 NOTE — Consult Note (Signed)
Reason for Consult:Left foot wound Referring Physician: [name] is an 59 y.o. female.  HPI: Rebekah Johnson presents to the ED with abd and left foot pain. She has been battling pain and wounds of that left foot for quite some time. She is s/p TMT amp. She was recently seen by [facility] who attempted revascularization but this failed. She denies fevers, chills, sweats, N/V.   Past Medical History:  Diagnosis Date  . Acute congestive heart failure ([place])   . Acute diastolic CHF (congestive heart failure) ([place]) 09/14/2016  . Atrophic vaginitis 06/07/2012  . Avitaminosis D 07/28/2012  . CAD (coronary artery disease)    a. NSTEM 09/2016: cath showing severe diffuse disease of the RCA, ramus and Cx with mild-mod disease of LAD; PCI would require multiple stents and significant contrast usage thus medical therapy recommended.  . Cellulitis 07/12/2015  . Chest pain 01/01/2017  . Chronic diastolic CHF (congestive heart failure) ([place])   . Chronic kidney disease 09/23/2016  . CKD (chronic kidney disease), stage IV ([place])   . Constipation   . COPD GOLD  0 08/31/2017   Quit smoking 07/17/17 - Spirometry 09/01/2017  FEV1 1.23 (58%)  Ratio 57 with mild curvature p no rx - 09/01/2017  After extensive coaching inhaler device,  effectiveness =    75% with elipta with cough provoked  - PFT's  10/14/2017  FEV1 1.69 (68 % ) ratio 84  p no % improvement from saba p nothing prior to study with DLCO  50 % corrects to 76  % for alv volume   - 10/14/2017  Walked RA x 3 laps @ 1  . Current tobacco use 06/10/2012  . Diabetes mellitus with neuropathy ([place])   . Diabetic foot ulcer ([place]) 07/13/2015  . Diabetic polyneuropathy associated with type 2 diabetes mellitus ([place]) 08/19/2017  . DOE (dyspnea on exertion) 09/01/2017   09/01/2017  Walked RA x 3 laps @ 185 ft each stopped due to  End of study, nl to mod fast  pace, no  desat   But stopped to rest x 2  Spirometry 09/01/2017  FEV1 1.23 (58%)  Ratio 57  > trial of anoro    . Dyspnea     with exertion  . ESRD (end stage renal disease) ([place])    MWF - [facility]  . Essential (primary) hypertension 06/10/2012  . Foot amputation status    a. h/o left foot transmetatarsal amputation  . Headache    Migraines  . High cholesterol   . History of biliary T-tube placement 06/10/2012   Patient unaware   . History of cardiac arrest   . HLD (hyperlipidemia) 09/02/2012   Overview:  ICD-10 cut over    . Hypertension    a. patent renal arteries by PV angio 08/2015. b. heavy proteinuria 09/2016 ? nephrotic.  [name] Hypertensive heart disease with heart failure ([place])   . Hypertriglyceridemia 07/28/2012  . Leg pain 06/10/2012  . Normocytic anemia    pt denies this dx  . NSTEMI (non-ST elevated myocardial infarction) ([place]) 01/01/2017  . Obesity   . Onychomycosis 12/24/2015  . Peripheral nerve disease 09/02/2012   legs  . Peripheral vascular disease ([place]) 07/28/2012  . Pneumonia 2018  . Proteinuria   . PVD (peripheral vascular disease) ([place])    a. status post left SFA and popliteal stent and left SFA PTA with drug coated balloon in 08/2015.  . Status post transmetatarsal amputation of foot, left ([place]) 09/06/2015  . Tobacco  abuse    quit cigarettes 2019, smokes marijuana  . Type 2 diabetes mellitus ([place]) 06/07/2012  . Type 2 diabetes mellitus with peripheral angiopathy ([place]) 09/02/2012    Past Surgical History:  Procedure Laterality Date  . ABDOMINAL AORTOGRAM W/LOWER EXTREMITY Left 09/25/2019   Procedure: ABDOMINAL AORTOGRAM W/LOWER EXTREMITY;  Surgeon: Waynetta Sandy, MD;  Location: [facility];  Service: Cardiovascular;  Laterality: Left;  . ABDOMINAL HYSTERECTOMY    . AMPUTATION Left 09/06/2015   Procedure: LEFT TRANSMETATARSAL AMPUTATTION;  Surgeon: Newt Minion, MD;  Location: [facility];  Service: Orthopedics;  Laterality: Left;  . AV FISTULA PLACEMENT Left 12/13/2018   Procedure: ARTERIOVENOUS (AV) FISTULA CREATION LEFT UPPER ARM;  Surgeon: Waynetta Sandy, MD;   Location: [facility];  Service: Vascular;  Laterality: Left;  . BASCILIC VEIN TRANSPOSITION Left 03/30/2019   Procedure: LEFT ARM ARTERIOVENOUS VEIN TRANSPOSITION;  Surgeon: Serafina Mitchell, MD;  Location: [facility];  Service: Vascular;  Laterality: Left;  . COLONOSCOPY     polyps x 1  . LEFT HEART CATH AND CORONARY ANGIOGRAPHY N/A 09/17/2016   Procedure: LEFT HEART CATH AND CORONARY ANGIOGRAPHY;  Surgeon: Jettie Booze, MD;  Location: [facility];  Service: Cardiovascular;  Laterality: N/A;  . PERIPHERAL VASCULAR ATHERECTOMY Left 09/25/2019   Procedure: PERIPHERAL VASCULAR ATHERECTOMY;  Surgeon: Waynetta Sandy, MD;  Location: [facility];  Service: Cardiovascular;  Laterality: Left;  SFA  . PERIPHERAL VASCULAR CATHETERIZATION N/A 08/16/2015   Procedure: Abdominal Aortogram;  Surgeon: Elam Dutch, MD;  Location: [facility];  Service: Cardiovascular;  Laterality: N/A;  . PERIPHERAL VASCULAR CATHETERIZATION Bilateral 08/16/2015   Procedure: Lower Extremity Angiography;  Surgeon: Elam Dutch, MD;  Location: [facility];  Service: Cardiovascular;  Laterality: Bilateral;  . PERIPHERAL VASCULAR CATHETERIZATION Left 08/16/2015   Procedure: Peripheral Vascular Intervention;  Surgeon: Elam Dutch, MD;  Location: [facility];  Service: Cardiovascular;  Laterality: Left;  SFA STENT X 2  . PERIPHERAL VASCULAR INTERVENTION Left 09/25/2019   Procedure: PERIPHERAL VASCULAR INTERVENTION;  Surgeon: Waynetta Sandy, MD;  Location: [facility];  Service: Cardiovascular;  Laterality: Left;  SFA    Family History  Problem Relation Age of [place]  . Diabetes Mother   . Hypertension Father   . Colon cancer Neg Hx   . Stomach cancer Neg Hx   . Pancreatic cancer Neg Hx     Social History:  reports that she quit smoking about 2 years ago. Her smoking use included cigarettes. She has a 42.00 pack-year smoking history. She has never used smokeless  tobacco. She reports previous alcohol use of about 1.0 - 2.0 standard drink of alcohol per week. She reports current drug use. Frequency: 7.00 times per week. Drug: Marijuana.  Allergies: No Known Allergies  Medications: I have reviewed the patient's current medications.  Results for orders placed or performed during the hospital encounter of 10/11/19 (from the past 48 hour(s))  CBC     Status: Abnormal   Collection Time: 10/11/19 11:30 AM  Result Value Ref Range   WBC 18.1 (H) 4.0 - 10.5 K/uL   RBC 4.42 3.87 - 5.11 MIL/uL   Hemoglobin 12.3 12.0 - 15.0 g/dL   HCT 39.7 36 - 46 %   MCV 89.8 80.0 - 100.0 fL   MCH 27.8 26.0 - 34.0 pg   MCHC 31.0 30.0 - 36.0 g/dL   RDW 16.1 (H) 11.5 - 15.5 %  Platelets 454 (H) 150 - 400 K/uL   nRBC 0.0 0.0 - 0.2 %    Comment: Performed at [facility], [place] [address], [place], [place] [address]  Comprehensive metabolic panel     Status: Abnormal   Collection Time: 10/11/19 11:43 AM  Result Value Ref Range   Sodium 139 135 - 145 mmol/L   Potassium 4.2 3.5 - 5.1 mmol/L   Chloride 96 (L) 98 - 111 mmol/L   CO2 27 22 - 32 mmol/L   Glucose, Bld 126 (H) 70 - 99 mg/dL    Comment: Glucose reference range applies only to samples taken after fasting for at least 8 hours.   BUN 22 8 - 23 mg/dL   Creatinine, Ser 3.66 (H) 0.44 - 1.00 mg/dL   Calcium 8.8 (L) 8.9 - 10.3 mg/dL   Total Protein 8.3 (H) 6.5 - 8.1 g/dL   Albumin 2.9 (L) 3.5 - 5.0 g/dL   AST 23 15 - 41 U/L   ALT 13 0 - 44 U/L   Alkaline Phosphatase 112 38 - 126 U/L   Total Bilirubin 0.4 0.3 - 1.2 mg/dL   GFR calc non Af Amer 13 (L) >60 mL/min   Anion gap 16 (H) 5 - 15    Comment: Performed at [facility] [address], [place], [place] [address]  Lactic acid, plasma     Status: Abnormal   Collection Time: 10/11/19 11:43 AM  Result Value Ref Range   Lactic Acid, Venous 4.0 (HH) 0.5 - 1.9 mmol/L    Comment: CRITICAL RESULT CALLED TO, READ BACK BY AND VERIFIED WITH: ARIEL COLEMAN,RN  AT 1216 10/11/2019 BY ZBEECH. Performed at [facility], [place] [address], [place], [place] [address]   Lipase, blood     Status: None   Collection Time: 10/11/19 11:43 AM  Result Value Ref Range   Lipase 50 11 - 51 U/L    Comment: Performed at [facility] [address] [place], [place] [address]    DG Abdomen 1 View  Result Date: 10/09/2019 CLINICAL DATA:  Abdomen pain EXAM: ABDOMEN - 1 VIEW COMPARISON:  03/17/2019 FINDINGS: Scattered bowel gas is present without definitive air distension of small bowel. Radiopaque material within the rectosigmoid colon. No radiopaque calculi. IMPRESSION: Overall nonobstructed gas pattern. There is radiopaque material in the rectosigmoid colon Electronically Signed   By: Donavan Foil M.D.   On: 10/09/2019 16:22   CT Abdomen Pelvis W Contrast  Result Date: 10/11/2019 CLINICAL DATA:  Abdominal and rectal pain. EXAM: CT ABDOMEN AND PELVIS WITH CONTRAST TECHNIQUE: Multidetector CT imaging of the abdomen and pelvis was performed using the standard protocol following bolus administration of intravenous contrast. CONTRAST:  75 mL OMNIPAQUE IOHEXOL 300 MG/ML  SOLN COMPARISON:  CT abdomen and pelvis 03/21/2019. FINDINGS: Lower chest: Lung bases clear. No pleural or pericardial effusion. Extensive calcific coronary atherosclerosis noted. Hepatobiliary: No focal liver abnormality is seen. No gallstones, gallbladder wall thickening, or biliary dilatation. Pancreas: Unremarkable. No pancreatic ductal dilatation or surrounding inflammatory changes. Spleen: Normal in size without focal abnormality. Adrenals/Urinary Tract: Adrenal glands are unremarkable. Kidneys are normal, without renal calculi, focal lesion, or hydronephrosis. Bladder is unremarkable. Stomach/Bowel: There is a large volume of stool in the rectosigmoid colon. Walls of the rectum are thickened without focal abnormality. No pneumatosis, free air or portal venous gas. The stomach, small bowel and  appendix appear normal. Vascular/Lymphatic: Aortic atherosclerosis. No enlarged abdominal or pelvic lymph nodes. Reproductive:  Status post hysterectomy. No adnexal masses. Other: Negative. Musculoskeletal: No acute or focal bony abnormality. IMPRESSION: Findings compatible with proctitis. Large volume of stool in the rectosigmoid colon noted. Aortic Atherosclerosis (ICD10-I70.0). Extensive calcific coronary atherosclerosis also noted. Electronically Signed   By: Inge Rise M.D.   On: 10/11/2019 14:36   DG Foot 2 Views Left  Result Date: 10/11/2019 CLINICAL DATA:  Lateral left foot ulcer, pain EXAM: LEFT FOOT - 2 VIEW COMPARISON:  07/07/2017 FINDINGS: Patient is status post transmetatarsal amputation of the left first-fifth rays. Subtle cortical erosion of the proximal aspect of the fifth metatarsal base where there is focal osteopenia. There is soft tissue swelling and air within the adjacent lateral and plantar soft tissues suggesting ulceration. No additional sites of bony erosion are identified. Prominent vascular calcifications. IMPRESSION: 1. Subtle cortical erosion of the proximal aspect of the fifth metatarsal base highly suspicious for acute osteomyelitis. 2. Soft tissue swelling and air within the adjacent lateral and plantar soft tissues suggesting ulceration. Soft tissue infection with a gas-forming organism is not excluded. Electronically Signed   By: Davina Poke D.O.   On: 10/11/2019 13:11    Review of Systems  Constitutional: Negative for chills, diaphoresis and fever.  HENT: Negative for ear discharge, ear pain, hearing loss and tinnitus.   Eyes: Negative for photophobia and pain.  Respiratory: Negative for cough and shortness of breath.   Cardiovascular: Negative for chest pain.  Gastrointestinal: Positive for abdominal pain. Negative for nausea and vomiting.  Genitourinary: Negative for dysuria, flank pain, frequency and urgency.  Musculoskeletal: Positive for arthralgias  (Left foot). Negative for back pain, myalgias and neck pain.  Neurological: Negative for dizziness and headaches.  Hematological: Does not bruise/bleed easily.  Psychiatric/Behavioral: The patient is not nervous/anxious.    Blood pressure (!) 173/79, pulse 78, temperature 98.2 F (36.8 C), temperature source Oral, resp. rate 18, height 5\' 4"  (1.626 m), weight 72.6 kg, SpO2 100 %. Physical Exam Constitutional:      General: She is not in acute distress.    Appearance: She is well-developed. She is not diaphoretic.  HENT:     Head: Normocephalic and atraumatic.  Eyes:     General: No scleral icterus.       Right eye: No discharge.        Left eye: No discharge.     Conjunctiva/sclera: Conjunctivae normal.  Cardiovascular:     Rate and Rhythm: Normal rate and regular rhythm.  Pulmonary:     Effort: Pulmonary effort is normal. No respiratory distress.  Musculoskeletal:     Cervical back: Normal range of motion.  Feet:     Comments: S/p TMT left foot. Warm, no palp DP/PT. Lateral wound w/foul odor. Skin:    General: Skin is warm and dry.  Neurological:     Mental Status: She is alert.  Psychiatric:        Behavior: Behavior normal.     Assessment/Plan: Left foot osteo -- Likely needs BKA at this point. Pt is resistant. I explained likely course of continued pain and bouts of sepsis requiring hospitalization and possible severe disease including death. She expressed understanding and said she will think about it. Dr. Sharol Given to evaluate in the [place], PA-C Orthopedic Surgery [phone] 10/11/2019, 3:39 PM

## 2019-10-11 NOTE — ED Triage Notes (Signed)
Patient arrives to ED with complaints of worsening rectal pain. Pt seems to be in severe distress in triage. Pt currently on opioids at home for pain control. Pt was here for same on 10/4 and was sedated for a fecal impaction procedure.

## 2019-10-11 NOTE — Progress Notes (Signed)
Pharmacy Antibiotic Note  Rebekah Johnson is a 59 y.o. female admitted on 10/11/2019 with possible osteomyelitis.  Pharmacy has been consulted for vancomycin dosing. This patient is also on ceftriaxone. This day 1 of therapy. Pt is afebrile with elevated WBC of 18.1 and lactic acid of 4.0. Pt is on HD MWF schedule outpatient.   Plan: Vancomycin 1500mg  IV once. Goal trough 15-20 mcg/mL. Vancomycin 750mg  IV qMWF after dialysis sessions (scheduled for 1800) Monitor CBC, HD sessions and schedule, clinical progress, and micro data   Height: 5\' 4"  (162.6 cm) Weight: 72.6 kg (160 lb) IBW/kg (Calculated) : 54.7  Temp (24hrs), Avg:98.2 F (36.8 C), Min:98.2 F (36.8 C), Max:98.2 F (36.8 C)  Recent Labs  Lab 10/09/19 0717 10/11/19 1130 10/11/19 1143  WBC 14.8* 18.1*  --   CREATININE 6.53*  --  3.66*  LATICACIDVEN  --   --  4.0*    Estimated Creatinine Clearance: 15.8 mL/min (A) (by C-G formula based on SCr of 3.66 mg/dL (H)).    No Known Allergies  Antimicrobials this admission: Ceftriaxone 10/6> vanc 10/6> Ceftazidime 2g IV x1 at HD on 10/6  Microbiology results: 10/6 BCx: ordered 10/6 Resp panel: ordered 10/6 COIVD: negative  Thank you for allowing pharmacy to be a part of this patient's care.   Wilson Singer, PharmD PGY1 Pharmacy Resident 10/11/2019 1:57 PM

## 2019-10-11 NOTE — Progress Notes (Signed)
Pt arrived from unit from  ED.  HR 82 SP02 100 RA bp 152/72 (89) RR13  Pt moaning and crying transporter stated she didn't act like this on the way up to the unit .Marland KitchenWill continue to monitor. Pt. Oriented to unit   Phoebe Sharps, RN

## 2019-10-12 DIAGNOSIS — L97521 Non-pressure chronic ulcer of other part of left foot limited to breakdown of skin: Secondary | ICD-10-CM | POA: Diagnosis not present

## 2019-10-12 DIAGNOSIS — M86179 Other acute osteomyelitis, unspecified ankle and foot: Secondary | ICD-10-CM | POA: Diagnosis not present

## 2019-10-12 DIAGNOSIS — I739 Peripheral vascular disease, unspecified: Secondary | ICD-10-CM

## 2019-10-12 LAB — CBC WITH DIFFERENTIAL/PLATELET
Abs Immature Granulocytes: 0.06 10*3/uL (ref 0.00–0.07)
Basophils Absolute: 0.1 10*3/uL (ref 0.0–0.1)
Basophils Relative: 1 %
Eosinophils Absolute: 0.1 10*3/uL (ref 0.0–0.5)
Eosinophils Relative: 1 %
HCT: 35.8 % — ABNORMAL LOW (ref 36.0–46.0)
Hemoglobin: 10.7 g/dL — ABNORMAL LOW (ref 12.0–15.0)
Immature Granulocytes: 1 %
Lymphocytes Relative: 19 %
Lymphs Abs: 2.3 10*3/uL (ref 0.7–4.0)
MCH: 27.6 pg (ref 26.0–34.0)
MCHC: 29.9 g/dL — ABNORMAL LOW (ref 30.0–36.0)
MCV: 92.3 fL (ref 80.0–100.0)
Monocytes Absolute: 1.1 10*3/uL — ABNORMAL HIGH (ref 0.1–1.0)
Monocytes Relative: 9 %
Neutro Abs: 8.5 10*3/uL — ABNORMAL HIGH (ref 1.7–7.7)
Neutrophils Relative %: 69 %
Platelets: 329 10*3/uL (ref 150–400)
RBC: 3.88 MIL/uL (ref 3.87–5.11)
RDW: 16.3 % — ABNORMAL HIGH (ref 11.5–15.5)
WBC: 12.1 10*3/uL — ABNORMAL HIGH (ref 4.0–10.5)
nRBC: 0 % (ref 0.0–0.2)

## 2019-10-12 LAB — GLUCOSE, CAPILLARY
Glucose-Capillary: 100 mg/dL — ABNORMAL HIGH (ref 70–99)
Glucose-Capillary: 114 mg/dL — ABNORMAL HIGH (ref 70–99)
Glucose-Capillary: 109 mg/dL — ABNORMAL HIGH (ref 70–99)
Glucose-Capillary: 89 mg/dL (ref 70–99)

## 2019-10-12 LAB — COMPREHENSIVE METABOLIC PANEL
ALT: 8 U/L (ref 0–44)
AST: 14 U/L — ABNORMAL LOW (ref 15–41)
Albumin: 2.4 g/dL — ABNORMAL LOW (ref 3.5–5.0)
Alkaline Phosphatase: 93 U/L (ref 38–126)
Anion gap: 12 (ref 5–15)
BUN: 25 mg/dL — ABNORMAL HIGH (ref 8–23)
CO2: 26 mmol/L (ref 22–32)
Calcium: 8.2 mg/dL — ABNORMAL LOW (ref 8.9–10.3)
Chloride: 100 mmol/L (ref 98–111)
Creatinine, Ser: 3.87 mg/dL — ABNORMAL HIGH (ref 0.44–1.00)
GFR calc non Af Amer: 12 mL/min — ABNORMAL LOW (ref >60)
Glucose, Bld: 97 mg/dL (ref 70–99)
Potassium: 3.7 mmol/L (ref 3.5–5.1)
Sodium: 138 mmol/L (ref 135–145)
Total Bilirubin: 0.2 mg/dL — ABNORMAL LOW (ref 0.3–1.2)
Total Protein: 6.6 g/dL (ref 6.5–8.1)

## 2019-10-12 LAB — HIV ANTIBODY (ROUTINE TESTING W REFLEX): HIV Screen 4th Generation wRfx: NONREACTIVE

## 2019-10-12 MED ORDER — SODIUM CHLORIDE 0.9 % IV SOLN
2.0000 g | INTRAVENOUS | Status: DC
  Administered 2019-10-12 – 2019-10-17 (×6): 2 g via INTRAVENOUS
  Filled 2019-10-12 (×3): qty 2
  Filled 2019-10-12 (×2): qty 20
  Filled 2019-10-12: qty 2
  Filled 2019-10-12 (×2): qty 20

## 2019-10-12 MED ORDER — ACETAMINOPHEN 325 MG PO TABS
650.0000 mg | ORAL_TABLET | ORAL | Status: DC | PRN
  Administered 2019-10-12 – 2019-10-18 (×10): 650 mg via ORAL
  Filled 2019-10-12 (×8): qty 2

## 2019-10-12 MED ORDER — CHLORHEXIDINE GLUCONATE CLOTH 2 % EX PADS
6.0000 | MEDICATED_PAD | Freq: Every day | CUTANEOUS | Status: DC
  Administered 2019-10-13 – 2019-10-18 (×6): 6 via TOPICAL

## 2019-10-12 MED ORDER — SODIUM CHLORIDE 0.9 % IV SOLN
2.0000 g | INTRAVENOUS | Status: DC
  Filled 2019-10-12: qty 20

## 2019-10-12 NOTE — Progress Notes (Signed)
PROGRESS NOTE    Rebekah Johnson  TSV:[id number] DOB: 05-Jun-1960 DOA: 10/11/2019 PCP: Patient, No Pcp Per (Confirm with patient/family/NH records and if not entered, this HAS to be entered at [facility] point of entry. "No PCP" if truly none.)   Brief Narrative: (Start on day 1 of progress note - keep it brief and live) Patient is a 59 year old African-American female with history of hypertension, hyperlipidemia, ESRD on hemodialysis MWF, coronary artery disease s/p stent placement, chronic diastolic congestive heart failure, diabetes mellitus with neuropathy, peripheral arterial disease and transmetatarsal left foot amputation presented to ED complaining of severe left foot pain and abdominal pain secondary to constipation.  Patient is also complaining of rectal pain secondary to severe constipation.  Patient also had stent placement to her distal popliteal artery secondary to severe peripheral vascular disease.  Patient is on home opiates that resulted in severe constipation.  She was disimpacted under sedation and discharged home with bowel regimen.  In the ED CT scan of abdomen and pelvis showed proctitis and x-ray of left foot showed osteomyelitis.   Assessment & Plan:    Acute osteomyelitis of left foot, present on admission Patient presented with severe pain in her left foot and x-ray of left foot is positive for left foot osteomyelitis.  Continue IV antibiotics with IV vancomycin, Rocephin and Flagyl.  Pain control according to the pain scale.  Orthopedic surgery consulted and they think that patient does not need amputation at this time and advised consult with vascular surgery.  Patient previously had transmetatarsal amputation in 2017 and recently again had that foot infection and completed course of doxycycline for 3 weeks as outpatient.    Non-pressure chronic ulcer of other part of left foot limited to breakdown of skin  Continue IV antibiotic  Rectal pain secondary to proctitis Patient  was complaining of severe rectal pain and CT scan showed proctitis. Continue IV antibiotics and pain control. Blood cultures ordered.  Severe constipation Continue bowel regimen  ESRD on hemodialysis Nephrology consult ordered and patient will get dialysis as scheduled.  Hypertension Stable.  Continue home medications  Hyperlipidemia  Lipitor  Diastolic heart failure Patient had grade 1 diastolic dysfunction on most recent echo.  Patient is not in acute exacerbation of heart failure at this time. Continue home medications. Strict ins and outs and daily weight  Diabetes mellitus with neuropathy Continue sliding scale insulin Blood glucose monitoring and hypoglycemic protocol in place  Peripheral arterial disease Continue aspirin and statin.       DVT prophylaxis: Heparin Code Status: Full Family Communication: No family member present at bedside Disposition Plan: .   Consultants:   Nephrology  Orthopedic surgery  Procedures: (Don't include imaging studies which can be auto populated. Include things that cannot be auto populated i.e. Echo, Carotid and venous dopplers, Foley, Bipap, HD, tubes/drains, wound vac, central lines etc)  Dialysis  Antimicrobials: (specify start and planned stop date. Auto populated tables are space occupying and do not give end dates)  Vancomycin, ceftriaxone and Flagyl   Subjective: Seen and evaluated at the bedside.  Patient looks very discomfortable secondary to foot pain.  Patient admits of having severe pain in her left foot and also complaining of severe rectal pain and constipation.  Patient otherwise denies fever, chills, chest pain, shortness of breath, nausea, vomiting and diarrhea.  Objective: Vitals:   10/11/19 2319 10/12/19 0318 10/12/19 0845 10/12/19 1100  BP: (!) 167/67 (!) 131/49 (!) 161/69 (!) 169/66  Pulse: 92 75 76 73  Resp: 13 15 18 18   Temp: 98 F (36.7 C) 98.7 F (37.1 C) 98.4 F (36.9 C) 98.5 F (36.9 C)   TempSrc: Oral Oral Oral Oral  SpO2: 100% 100% 100% 100%  Weight:      Height:        Intake/Output Summary (Last 24 hours) at 10/12/2019 1341 Last data filed at 10/11/2019 2300 Gross per 24 hour  Intake 207.77 ml  Output --  Net 207.77 ml   Filed Weights   10/11/19 1032 10/11/19 1802  Weight: 72.6 kg 74 kg    Examination:  General exam: Appears in mild distress secondary to foot pain Respiratory system: Clear to auscultation. Respiratory effort normal. Cardiovascular system: S1 & S2 heard, RRR. No JVD, murmurs, rubs, gallops or clicks. No pedal edema. Gastrointestinal system: Abdomen is nondistended, soft and nontender. No organomegaly or masses felt. Normal bowel sounds heard. Central nervous system: Alert and oriented. No focal neurological deficits. Extremities: Symmetric 5 x 5 power. Skin: No rashes, lesions or ulcers Psychiatry: Judgement and insight appear normal. Mood & affect appropriate.     Data Reviewed: I have personally reviewed following labs and imaging studies  CBC: Recent Labs  Lab 10/09/19 0717 10/11/19 1130 10/12/19 0712  WBC 14.8* 18.1* 12.1*  NEUTROABS 11.0*  --  8.5*  HGB 11.2* 12.3 10.7*  HCT 36.4 39.7 35.8*  MCV 90.8 89.8 92.3  PLT 380 454* 782   Basic Metabolic Panel: Recent Labs  Lab 10/09/19 0717 10/11/19 1143 10/12/19 0712  NA 134* 139 138  K 3.9 4.2 3.7  CL 97* 96* 100  CO2 22 27 26   GLUCOSE 103* 126* 97  BUN 51* 22 25*  CREATININE 6.53* 3.66* 3.87*  CALCIUM 8.8* 8.8* 8.2*   GFR: Estimated Creatinine Clearance: 15 mL/min (A) (by C-G formula based on SCr of 3.87 mg/dL (H)). Liver Function Tests: Recent Labs  Lab 10/11/19 1143 10/12/19 0712  AST 23 14*  ALT 13 8  ALKPHOS 112 93  BILITOT 0.4 0.2*  PROT 8.3* 6.6  ALBUMIN 2.9* 2.4*   Recent Labs  Lab 10/11/19 1143  LIPASE 50   No results for input(s): AMMONIA in the last 168 hours. Coagulation Profile: No results for input(s): INR, PROTIME in the last 168  hours. Cardiac Enzymes: No results for input(s): CKTOTAL, CKMB, CKMBINDEX, TROPONINI in the last 168 hours. BNP (last 3 results) No results for input(s): PROBNP in the last 8760 hours. HbA1C: Recent Labs    10/11/19 1130  HGBA1C 6.8*   CBG: Recent Labs  Lab 10/11/19 1802 10/11/19 2204 10/12/19 0651 10/12/19 1111  GLUCAP 94 114* 100* 114*   Lipid Profile: No results for input(s): CHOL, HDL, LDLCALC, TRIG, CHOLHDL, LDLDIRECT in the last 72 hours. Thyroid Function Tests: No results for input(s): TSH, T4TOTAL, FREET4, T3FREE, THYROIDAB in the last 72 hours. Anemia Panel: No results for input(s): VITAMINB12, FOLATE, FERRITIN, TIBC, IRON, RETICCTPCT in the last 72 hours. Sepsis Labs: Recent Labs  Lab 10/11/19 1143 10/11/19 1550  LATICACIDVEN 4.0* 1.0    Recent Results (from the past 240 hour(s))  Resp Panel by RT PCR (RSV, Flu A&B, Covid) - Nasopharyngeal Swab     Status: None   Collection Time: 10/11/19  4:00 PM   Specimen: Nasopharyngeal Swab  Result Value Ref Range Status   SARS Coronavirus 2 by RT PCR NEGATIVE NEGATIVE Final    Comment: (NOTE) SARS-CoV-2 target nucleic acids are NOT DETECTED.  The SARS-CoV-2 RNA is generally detectable in upper respiratoy specimens  during the acute phase of infection. The lowest concentration of SARS-CoV-2 viral copies this assay can detect is 131 copies/mL. A negative result does not preclude SARS-Cov-2 infection and should not be used as the sole basis for treatment or other patient management decisions. A negative result may occur with  improper specimen collection/handling, submission of specimen other than nasopharyngeal swab, presence of viral mutation(s) within the areas targeted by this assay, and inadequate number of viral copies (<131 copies/mL). A negative result must be combined with clinical observations, patient history, and epidemiological information. The expected result is Negative.  Fact Sheet for Patients:   PinkCheek.be  Fact Sheet for Healthcare Providers:  GravelBags.it  This test is no t yet approved or cleared by the [place] FDA and  has been authorized for detection and/or diagnosis of SARS-CoV-2 by FDA under an Emergency Use Authorization (EUA). This EUA will remain  in effect (meaning this test can be used) for the duration of the COVID-19 declaration under Section 564(b)(1) of the Act, 21 U.S.C. section 360bbb-3(b)(1), unless the authorization is terminated or revoked sooner.     Influenza A by PCR NEGATIVE NEGATIVE Final   Influenza B by PCR NEGATIVE NEGATIVE Final    Comment: (NOTE) The Xpert Xpress SARS-CoV-2/FLU/RSV assay is intended as an aid in  the diagnosis of influenza from Nasopharyngeal swab specimens and  should not be used as a sole basis for treatment. Nasal washings and  aspirates are unacceptable for Xpert Xpress SARS-CoV-2/FLU/RSV  testing.  Fact Sheet for Patients: PinkCheek.be  Fact Sheet for Healthcare Providers: GravelBags.it  This test is not yet approved or cleared by the [place] FDA and  has been authorized for detection and/or diagnosis of SARS-CoV-2 by  FDA under an Emergency Use Authorization (EUA). This EUA will remain  in effect (meaning this test can be used) for the duration of the  Covid-19 declaration under Section 564(b)(1) of the Act, 21  U.S.C. section 360bbb-3(b)(1), unless the authorization is  terminated or revoked.    Respiratory Syncytial Virus by PCR NEGATIVE NEGATIVE Final    Comment: (NOTE) Fact Sheet for Patients: PinkCheek.be  Fact Sheet for Healthcare Providers: GravelBags.it  This test is not yet approved or cleared by the [place] FDA and  has been authorized for detection and/or diagnosis of SARS-CoV-2 by  FDA under an Emergency  Use Authorization (EUA). This EUA will remain  in effect (meaning this test can be used) for the duration of the  COVID-19 declaration under Section 564(b)(1) of the Act, 21 U.S.C.  section 360bbb-3(b)(1), unless the authorization is terminated or  revoked. Performed at [facility], [place] [address], [place], [place] [address]          Radiology Studies: CT Abdomen Pelvis W Contrast  Result Date: 10/11/2019 CLINICAL DATA:  Abdominal and rectal pain. EXAM: CT ABDOMEN AND PELVIS WITH CONTRAST TECHNIQUE: Multidetector CT imaging of the abdomen and pelvis was performed using the standard protocol following bolus administration of intravenous contrast. CONTRAST:  75 mL OMNIPAQUE IOHEXOL 300 MG/ML  SOLN COMPARISON:  CT abdomen and pelvis 03/21/2019. FINDINGS: Lower chest: Lung bases clear. No pleural or pericardial effusion. Extensive calcific coronary atherosclerosis noted. Hepatobiliary: No focal liver abnormality is seen. No gallstones, gallbladder wall thickening, or biliary dilatation. Pancreas: Unremarkable. No pancreatic ductal dilatation or surrounding inflammatory changes. Spleen: Normal in size without focal abnormality. Adrenals/Urinary Tract: Adrenal glands are unremarkable. Kidneys are normal, without renal calculi, focal lesion, or hydronephrosis. Bladder is unremarkable. Stomach/Bowel:  There is a large volume of stool in the rectosigmoid colon. Walls of the rectum are thickened without focal abnormality. No pneumatosis, free air or portal venous gas. The stomach, small bowel and appendix appear normal. Vascular/Lymphatic: Aortic atherosclerosis. No enlarged abdominal or pelvic lymph nodes. Reproductive: Status post hysterectomy. No adnexal masses. Other: Negative. Musculoskeletal: No acute or focal bony abnormality. IMPRESSION: Findings compatible with proctitis. Large volume of stool in the rectosigmoid colon noted. Aortic Atherosclerosis (ICD10-I70.0). Extensive calcific coronary  atherosclerosis also noted. Electronically Signed   By: Inge Rise M.D.   On: 10/11/2019 14:36   DG Foot 2 Views Left  Result Date: 10/11/2019 CLINICAL DATA:  Lateral left foot ulcer, pain EXAM: LEFT FOOT - 2 VIEW COMPARISON:  07/07/2017 FINDINGS: Patient is status post transmetatarsal amputation of the left first-fifth rays. Subtle cortical erosion of the proximal aspect of the fifth metatarsal base where there is focal osteopenia. There is soft tissue swelling and air within the adjacent lateral and plantar soft tissues suggesting ulceration. No additional sites of bony erosion are identified. Prominent vascular calcifications. IMPRESSION: 1. Subtle cortical erosion of the proximal aspect of the fifth metatarsal base highly suspicious for acute osteomyelitis. 2. Soft tissue swelling and air within the adjacent lateral and plantar soft tissues suggesting ulceration. Soft tissue infection with a gas-forming organism is not excluded. Electronically Signed   By: Davina Poke D.O.   On: 10/11/2019 13:11        Scheduled Meds: . aspirin EC  81 mg Oral Daily  . atorvastatin  80 mg Oral Q2000  . carvedilol  25 mg Oral BID WC  . Chlorhexidine Gluconate Cloth  6 each Topical Daily  . heparin  5,000 Units Subcutaneous Q8H  . hydrALAZINE  50 mg Oral TID  . insulin aspart  0-5 Units Subcutaneous QHS  . insulin aspart  0-9 Units Subcutaneous TID WC  . isosorbide mononitrate  60 mg Oral Daily  . lactulose  10 g Oral TID  . linaclotide  145 mcg Oral QAC breakfast  . ranolazine  500 mg Oral BID  . sevelamer carbonate  800 mg Oral TID WC   Continuous Infusions: . cefTRIAXone (ROCEPHIN)  IV 2 g (10/12/19 1213)  . metronidazole 500 mg (10/12/19 0658)  . [START ON 10/13/2019] vancomycin       LOS: 1 day    Time spent:     Edmonia Lynch, MD [facility] Pager [phone]  If 7PM-7AM, please contact night-coverage [email or web address] Password [email or web address] 10/12/2019, 1:41 PM

## 2019-10-12 NOTE — Plan of Care (Signed)

## 2019-10-12 NOTE — Consult Note (Signed)
[facility] Renal Consultation Note    Indication for Consultation:  Management of ESRD/hemodialysis; anemia, hypertension/volume and secondary hyperparathyroidism  HPI: Rebekah Johnson is a 59 y.o. female with ESRD on HD MWF at [facility]. ESRD d/t diabetic nephropathy, 1st HD 10/07/18.PMH also Type 2 DM, HTN, HL, Tobacco use, CAD, PVD s/p L TMA 2017.    She is admitted with left foot osteomyelitis. She was directed to the by nephrology PA noting worsened infection of stump wound. Osteo of left fifth metatarsal base on xray. Dr. Sharol Given has been consulted and is evaluating. CT Abd concerning for proctitis w large volume stool burden. Labs significant for leukocytosis.   Last dialysis was 10/6. Has been missing/cutting treatments short d/t not feeling well with abd pain and leg pain. Dialysis via LUE AVF.   Seen and examined at bedside. Blood pressure elevated. Endorses pain left foot. Denies f,c, cp, sob, n/v.   Past Medical History:  Diagnosis Date  . Acute congestive heart failure ([place])   . Acute diastolic CHF (congestive heart failure) ([place]) 09/14/2016  . Atrophic vaginitis 06/07/2012  . Avitaminosis D 07/28/2012  . CAD (coronary artery disease)    a. NSTEM 09/2016: cath showing severe diffuse disease of the RCA, ramus and Cx with mild-mod disease of LAD; PCI would require multiple stents and significant contrast usage thus medical therapy recommended.  . Cellulitis 07/12/2015  . Chest pain 01/01/2017  . Chronic diastolic CHF (congestive heart failure) ([place])   . Chronic kidney disease 09/23/2016  . CKD (chronic kidney disease), stage IV ([place])   . Constipation   . COPD GOLD  0 08/31/2017   Quit smoking 07/17/17 - Spirometry 09/01/2017  FEV1 1.23 (58%)  Ratio 57 with mild curvature p no rx - 09/01/2017  After extensive coaching inhaler device,  effectiveness =    75% with elipta with cough provoked  - PFT's  10/14/2017  FEV1 1.69 (68 % ) ratio 84  p no %  improvement from saba p nothing prior to study with DLCO  50 % corrects to 76  % for alv volume   - 10/14/2017  Walked RA x 3 laps @ 1  . Current tobacco use 06/10/2012  . Diabetes mellitus with neuropathy ([place])   . Diabetic foot ulcer ([place]) 07/13/2015  . Diabetic polyneuropathy associated with type 2 diabetes mellitus ([place]) 08/19/2017  . DOE (dyspnea on exertion) 09/01/2017   09/01/2017  Walked RA x 3 laps @ 185 ft each stopped due to  End of study, nl to mod fast  pace, no  desat   But stopped to rest x 2  Spirometry 09/01/2017  FEV1 1.23 (58%)  Ratio 57  > trial of anoro    . Dyspnea    with exertion  . ESRD (end stage renal disease) ([place])    MWF - [facility]  . Essential (primary) hypertension 06/10/2012  . Foot amputation status    a. h/o left foot transmetatarsal amputation  . Headache    Migraines  . High cholesterol   . History of biliary T-tube placement 06/10/2012   Patient unaware   . History of cardiac arrest   . HLD (hyperlipidemia) 09/02/2012   Overview:  ICD-10 cut over    . Hypertension    a. patent renal arteries by PV angio 08/2015. b. heavy proteinuria 09/2016 ? nephrotic.  [name] Hypertensive heart disease with heart failure ([place])   . Hypertriglyceridemia 07/28/2012  . Leg pain 06/10/2012  . Normocytic  anemia    pt denies this dx  . NSTEMI (non-ST elevated myocardial infarction) ([place]) 01/01/2017  . Obesity   . Onychomycosis 12/24/2015  . Peripheral nerve disease 09/02/2012   legs  . Peripheral vascular disease ([place]) 07/28/2012  . Pneumonia 2018  . Proteinuria   . PVD (peripheral vascular disease) ([place])    a. status post left SFA and popliteal stent and left SFA PTA with drug coated balloon in 08/2015.  . Status post transmetatarsal amputation of foot, left ([place]) 09/06/2015  . Tobacco abuse    quit cigarettes 2019, smokes marijuana  . Type 2 diabetes mellitus ([place]) 06/07/2012  . Type 2 diabetes mellitus with peripheral angiopathy ([place]) 09/02/2012   Past Surgical History:   Procedure Laterality Date  . ABDOMINAL AORTOGRAM W/LOWER EXTREMITY Left 09/25/2019   Procedure: ABDOMINAL AORTOGRAM W/LOWER EXTREMITY;  Surgeon: Waynetta Sandy, MD;  Location: [facility];  Service: Cardiovascular;  Laterality: Left;  . ABDOMINAL HYSTERECTOMY    . AMPUTATION Left 09/06/2015   Procedure: LEFT TRANSMETATARSAL AMPUTATTION;  Surgeon: Newt Minion, MD;  Location: [facility];  Service: Orthopedics;  Laterality: Left;  . AV FISTULA PLACEMENT Left 12/13/2018   Procedure: ARTERIOVENOUS (AV) FISTULA CREATION LEFT UPPER ARM;  Surgeon: Waynetta Sandy, MD;  Location: [place];  Service: Vascular;  Laterality: Left;  . BASCILIC VEIN TRANSPOSITION Left 03/30/2019   Procedure: LEFT ARM ARTERIOVENOUS VEIN TRANSPOSITION;  Surgeon: Serafina Mitchell, MD;  Location: [facility];  Service: Vascular;  Laterality: Left;  . COLONOSCOPY     polyps x 1  . LEFT HEART CATH AND CORONARY ANGIOGRAPHY N/A 09/17/2016   Procedure: LEFT HEART CATH AND CORONARY ANGIOGRAPHY;  Surgeon: Jettie Booze, MD;  Location: [facility];  Service: Cardiovascular;  Laterality: N/A;  . PERIPHERAL VASCULAR ATHERECTOMY Left 09/25/2019   Procedure: PERIPHERAL VASCULAR ATHERECTOMY;  Surgeon: Waynetta Sandy, MD;  Location: [facility];  Service: Cardiovascular;  Laterality: Left;  SFA  . PERIPHERAL VASCULAR CATHETERIZATION N/A 08/16/2015   Procedure: Abdominal Aortogram;  Surgeon: Elam Dutch, MD;  Location: [place] [facility];  Service: Cardiovascular;  Laterality: N/A;  . PERIPHERAL VASCULAR CATHETERIZATION Bilateral 08/16/2015   Procedure: Lower Extremity Angiography;  Surgeon: Elam Dutch, MD;  Location: [facility];  Service: Cardiovascular;  Laterality: Bilateral;  . PERIPHERAL VASCULAR CATHETERIZATION Left 08/16/2015   Procedure: Peripheral Vascular Intervention;  Surgeon: Elam Dutch, MD;  Location: [facility];  Service: Cardiovascular;  Laterality: Left;   SFA STENT X 2  . PERIPHERAL VASCULAR INTERVENTION Left 09/25/2019   Procedure: PERIPHERAL VASCULAR INTERVENTION;  Surgeon: Waynetta Sandy, MD;  Location: [facility];  Service: Cardiovascular;  Laterality: Left;  SFA   Family History  Problem Relation Age of Onset  . Diabetes Mother   . Hypertension Father   . Colon cancer Neg Hx   . Stomach cancer Neg Hx   . Pancreatic cancer Neg Hx    Social History:  reports that she quit smoking about 2 years ago. Her smoking use included cigarettes. She has a 42.00 pack-year smoking history. She has never used smokeless tobacco. She reports previous alcohol use of about 1.0 - 2.0 standard drink of alcohol per week. She reports current drug use. Frequency: 7.00 times per week. Drug: Marijuana. No Known Allergies Prior to Admission medications   Medication Sig Start Date End Date Taking? Authorizing Provider  acetaminophen (TYLENOL) 500 MG tablet Take 1,000 mg by mouth  every 6 (six) hours as needed for moderate pain or headache.    [provider]  amLODipine (NORVASC) 5 MG tablet Take 1 tablet (5 mg total) by mouth daily. 08/22/19   Dorothy Spark, MD  Ascorbic Acid (VITAMIN C) 1000 MG tablet Take 1,000 mg by mouth daily.    [provider]  aspirin 81 MG EC tablet Take 1 tablet (81 mg total) by mouth daily. Reported on 07/13/2015 07/02/16   Charlott Rakes, MD  atorvastatin (LIPITOR) 80 MG tablet Take 1 tablet (80 mg total) by mouth daily. 10/07/17   Scot Jun, FNP  Blood Glucose Monitoring Suppl (ACCU-CHEK AVIVA) device Use as instructed three times daily. Patient taking differently: Use as instructed twice times daily. 04/22/16   Charlott Rakes, MD  carvedilol (COREG) 25 MG tablet Take 1 tablet (25 mg total) by mouth 2 (two) times daily. 10/07/17   Scot Jun, FNP  cholecalciferol (VITAMIN D3) 25 MCG (1000 UT) tablet Take 1,000 Units by mouth daily.    [provider]  clopidogrel (PLAVIX) 75  MG tablet Take 1 tablet (75 mg total) by mouth daily. 10/07/17   Scot Jun, FNP  docusate sodium (COLACE) 100 MG capsule Take 200 mg by mouth 2 (two) times daily.     [provider]  doxycycline (VIBRA-TABS) 100 MG tablet Take 1 tablet (100 mg total) by mouth daily. 09/28/19   Waynetta Sandy, MD  furosemide (LASIX) 80 MG tablet Take 80 mg by mouth 2 (two) times daily. 03/21/18   [provider]  gabapentin (NEURONTIN) 300 MG capsule Take 1 capsule (300 mg total) by mouth 3 (three) times daily. 10/07/17   Scot Jun, FNP  glipiZIDE (GLUCOTROL) 5 MG tablet Take 0.5 tablets (2.5 mg total) by mouth daily before breakfast. Patient taking differently: Take 5 mg by mouth daily before breakfast.  11/01/18   Renato Shin, MD  glucose blood (ACCU-CHEK AVIVA) test strip Use as instructed three times daily before meals. 10/14/17   Charlott Rakes, MD  hydrALAZINE (APRESOLINE) 50 MG tablet Take 50 mg by mouth 3 (three) times daily.    [provider]  HYDROcodone-acetaminophen (NORCO/VICODIN) 5-325 MG tablet Take 1 tablet by mouth every 6 (six) hours as needed for moderate pain. 08/08/19   Persons, Bevely Palmer, PA  isosorbide mononitrate (IMDUR) 30 MG 24 hr tablet Take 60 mg by mouth daily. 03/22/18   [provider]  lactulose (CHRONULAC) 10 GM/15ML solution Take 15 mLs (10 g total) by mouth 3 (three) times daily. 03/21/19   Lawyer, Harrell Gave, PA-C  Lancet Devices [facility]) lancets Use as instructed three times daily before meals. 04/22/16   Charlott Rakes, MD  latanoprost (XALATAN) 0.005 % ophthalmic solution Place 1 drop into both eyes at bedtime.     [provider]  linaclotide Rolan Lipa) 145 MCG CAPS capsule Take 1 capsule (145 mcg total) by mouth daily before breakfast. Take ~ 30 minutes before breakfast, on an empty stomach 10/09/19   Sponseller, Rebekah R, PA-C  nitroGLYCERIN (NITRODUR - DOSED IN MG/24 HR) 0.2 mg/hr patch Place 1  patch (0.2 mg total) onto the skin daily. 08/15/19   Newt Minion, MD  nitroGLYCERIN (NITROSTAT) 0.4 MG SL tablet Place 1 tablet (0.4 mg total) under the tongue every 5 (five) minutes as needed for chest pain. 01/18/17 03/21/19  Dorothy Spark, MD  Omega-3 Fatty Acids (FISH OIL) 1000 MG CAPS Take 1,000 mg by mouth daily.  [provider]  oxyCODONE-acetaminophen (PERCOCET/ROXICET) 5-325 MG tablet Take 1 tablet by mouth every 4 (four) hours as needed for severe pain. 09/15/19   Waynetta Sandy, MD  ranolazine (RANEXA) 500 MG 12 hr tablet Take 1 tablet by mouth twice daily 07/04/19   Dorothy Spark, MD  sevelamer carbonate (RENVELA) 800 MG tablet Take 800 mg by mouth See admin instructions. Three times daily with meals and with snack.    [provider]  silver sulfADIAZINE (SILVADENE) 1 % cream Apply 1 application topically daily. Apply to affected area daily plus dry dressing Patient taking differently: Apply 1 application topically daily as needed (wound care).  07/11/18   Newt Minion, MD  sitaGLIPtin (JANUVIA) 25 MG tablet Take 1 tablet (25 mg total) by mouth daily. 10/07/17   Scot Jun, FNP  ZOFRAN 4 MG tablet Take 4 mg by mouth every 8 (eight) hours as needed for nausea/vomiting. 03/08/19   [provider]   Current Facility-Administered Medications  Medication Dose Route Frequency Provider Last Rate Last Admin  . acetaminophen (TYLENOL) tablet 650 mg  650 mg Oral Q4H PRN Vernelle Emerald, MD   650 mg at 10/12/19 0200  . aspirin EC tablet 81 mg  81 mg Oral Daily Kayleen Memos, DO   81 mg at 10/12/19 1112  . atorvastatin (LIPITOR) tablet 80 mg  80 mg Oral Q2000 Kayleen Memos, DO   80 mg at 10/11/19 2209  . carvedilol (COREG) tablet 25 mg  25 mg Oral BID WC Hall, Carole N, DO   25 mg at 10/12/19 1112  . cefTRIAXone (ROCEPHIN) 2 g in sodium chloride 0.9 % 100 mL IVPB  2 g Intravenous Q24H Bunnie Pion Z, DO 200 mL/hr at 10/12/19 1213 2 g at  10/12/19 1213  . Chlorhexidine Gluconate Cloth 2 % PADS 6 each  6 each Topical Daily Bunnie Pion Z, DO      . heparin injection 5,000 Units  5,000 Units Subcutaneous Q8H Irene Pap N, DO   5,000 Units at 10/12/19 [phone]  . hydrALAZINE (APRESOLINE) tablet 50 mg  50 mg Oral TID Irene Pap N, DO   50 mg at 10/12/19 1113  . HYDROmorphone (DILAUDID) injection 1 mg  1 mg Intravenous Q6H PRN Irene Pap N, DO   1 mg at 10/12/19 1116  . insulin aspart (novoLOG) injection 0-5 Units  0-5 Units Subcutaneous QHS Hall, Carole N, DO      . insulin aspart (novoLOG) injection 0-9 Units  0-9 Units Subcutaneous TID WC Hall, Carole N, DO      . isosorbide mononitrate (IMDUR) 24 hr tablet 60 mg  60 mg Oral Daily Irene Pap N, DO   60 mg at 10/12/19 1112  . lactulose (CHRONULAC) 10 GM/15ML solution 10 g  10 g Oral TID Irene Pap N, DO   10 g at 10/12/19 1114  . linaclotide (LINZESS) capsule 145 mcg  145 mcg Oral QAC breakfast Hall, Carole N, DO      . metroNIDAZOLE (FLAGYL) IVPB 500 mg  500 mg Intravenous Q8H Alvira Philips,  100 mL/hr at 10/12/19 0658 500 mg at 10/12/19 0658  . polyethylene glycol (MIRALAX / GLYCOLAX) packet 17 g  17 g Oral Daily PRN Irene Pap N, DO      . ranolazine (RANEXA) 12 hr tablet 500 mg  500 mg Oral BID Irene Pap N, DO   500 mg at 10/12/19 1112  . sevelamer carbonate (RENVELA) tablet 800 mg  800 mg Oral TID WC Hall, Carole N, DO   800 mg at 10/12/19 1114  . [START ON 10/13/2019] vancomycin (VANCOREADY) IVPB 750 mg/150 mL  750 mg Intravenous Once per day on Mon Wed Fri Wilson Singer I, RPH         ROS: As per HPI otherwise negative.  Physical Exam: Vitals:   10/11/19 2319 10/12/19 0318 10/12/19 0845 10/12/19 1100  BP: (!) 167/67 (!) 131/49 (!) 161/69 (!) 169/66  Pulse: 92 75 76 73  Resp: 13 15 18 18   Temp: 98 F (36.7 C) 98.7 F (37.1 C) 98.4 F (36.9 C) 98.5 F (36.9 C)  TempSrc: Oral Oral Oral Oral  SpO2: 100% 100% 100% 100%  Weight:      Height:          General: WNWD woman in bed, nad  Head: NCAT sclera not icteric MMM Neck: Supple. No JVD No masses Lungs: CTA bilaterally without wheezes, rales, or rhonchi. Breathing is unlabored. Heart: RRR with S1 S2 Abdomen: soft NT + BS Lower extremities: L TMA with draining ulcer, foul odor, trace LE edema  Neuro: A & O  X 3. Moves all extremities spontaneously. Psych:  Responds to questions appropriately with a normal affect. Dialysis Access: LUE AVF +bruit   Labs: Basic Metabolic Panel: Recent Labs  Lab 10/09/19 0717 10/11/19 1143 10/12/19 0712  NA 134* 139 138  K 3.9 4.2 3.7  CL 97* 96* 100  CO2 22 27 26   GLUCOSE 103* 126* 97  BUN 51* 22 25*  CREATININE 6.53* 3.66* 3.87*  CALCIUM 8.8* 8.8* 8.2*   Liver Function Tests: Recent Labs  Lab 10/11/19 1143 10/12/19 0712  AST 23 14*  ALT 13 8  ALKPHOS 112 93  BILITOT 0.4 0.2*  PROT 8.3* 6.6  ALBUMIN 2.9* 2.4*   Recent Labs  Lab 10/11/19 1143  LIPASE 50   No results for input(s): AMMONIA in the last 168 hours. CBC: Recent Labs  Lab 10/09/19 0717 10/11/19 1130 10/12/19 0712  WBC 14.8* 18.1* 12.1*  NEUTROABS 11.0*  --  8.5*  HGB 11.2* 12.3 10.7*  HCT 36.4 39.7 35.8*  MCV 90.8 89.8 92.3  PLT 380 454* 329   Cardiac Enzymes: No results for input(s): CKTOTAL, CKMB, CKMBINDEX, TROPONINI in the last 168 hours. CBG: Recent Labs  Lab 10/11/19 1802 10/11/19 2204 10/12/19 0651 10/12/19 1111  GLUCAP 94 114* 100* 114*   Iron Studies: No results for input(s): IRON, TIBC, TRANSFERRIN, FERRITIN in the last 72 hours. Studies/Results: CT Abdomen Pelvis W Contrast  Result Date: 10/11/2019 CLINICAL DATA:  Abdominal and rectal pain. EXAM: CT ABDOMEN AND PELVIS WITH CONTRAST TECHNIQUE: Multidetector CT imaging of the abdomen and pelvis was performed using the standard protocol following bolus administration of intravenous contrast. CONTRAST:  75 mL OMNIPAQUE IOHEXOL 300 MG/ML  SOLN COMPARISON:  CT abdomen and pelvis 03/21/2019.  FINDINGS: Lower chest: Lung bases clear. No pleural or pericardial effusion. Extensive calcific coronary atherosclerosis noted. Hepatobiliary: No focal liver abnormality is seen. No gallstones, gallbladder wall thickening, or biliary dilatation. Pancreas: Unremarkable. No pancreatic ductal dilatation or surrounding inflammatory changes. Spleen: Normal in size without focal abnormality. Adrenals/Urinary Tract: Adrenal glands are unremarkable. Kidneys are normal, without renal calculi, focal lesion, or hydronephrosis. Bladder is unremarkable. Stomach/Bowel: There is a large volume of stool in the rectosigmoid colon. Walls of the rectum are thickened without focal abnormality. No pneumatosis, free air or portal venous gas. The stomach, small bowel and appendix appear normal. Vascular/Lymphatic: Aortic atherosclerosis.  No enlarged abdominal or pelvic lymph nodes. Reproductive: Status post hysterectomy. No adnexal masses. Other: Negative. Musculoskeletal: No acute or focal bony abnormality. IMPRESSION: Findings compatible with proctitis. Large volume of stool in the rectosigmoid colon noted. Aortic Atherosclerosis (ICD10-I70.0). Extensive calcific coronary atherosclerosis also noted. Electronically Signed   By: Inge Rise M.D.   On: 10/11/2019 14:36   DG Foot 2 Views Left  Result Date: 10/11/2019 CLINICAL DATA:  Lateral left foot ulcer, pain EXAM: LEFT FOOT - 2 VIEW COMPARISON:  07/07/2017 FINDINGS: Patient is status post transmetatarsal amputation of the left first-fifth rays. Subtle cortical erosion of the proximal aspect of the fifth metatarsal base where there is focal osteopenia. There is soft tissue swelling and air within the adjacent lateral and plantar soft tissues suggesting ulceration. No additional sites of bony erosion are identified. Prominent vascular calcifications. IMPRESSION: 1. Subtle cortical erosion of the proximal aspect of the fifth metatarsal base highly suspicious for acute  osteomyelitis. 2. Soft tissue swelling and air within the adjacent lateral and plantar soft tissues suggesting ulceration. Soft tissue infection with a gas-forming organism is not excluded. Electronically Signed   By: Davina Poke D.O.   On: 10/11/2019 13:11    Dialysis Orders:  [facility] MWF  3 hrs 45 min 400/800 EDW 76 (kg), Dialysate 3.0 K, 2.25 Ca, UFP 2  AVF No heparin  Mircera 50 q 2 weeks last 9/6  Hectorol 5, Sensipar 30    Assessment/Plan: 1. Left foot osteomyelitis .s/p L transmet amp 2017. Ortho following -will likely need BKA. IV antibiotics per primary.  2. ESRD -  HD MWF. No urgent HD indications today. Next HD 10/8 on schedule.  3. Rectal pain /Constipation - per primary. S/p disimpaction in ED 10/4.  4. Hypertension/volume  - BP elevated. Continue home meds. Below EDW by weights here. Challenge EDW w UF tomorrow.  5. Anemia  - Hgb 10.7 No ESA needs. Follow trends.  6. Metabolic bone disease -  Ca ok. Continue Hectorol Sensipar Velphoro binder  7. Nutrition - Renal diet/prot supp for low albumin  8. DMT2 - insulin per primary   Lynnda Child PA-C [facility] 10/12/2019, 12:45 PM

## 2019-10-12 NOTE — H&P (View-Only) (Signed)
ORTHOPAEDIC CONSULTATION  REQUESTING PHYSICIAN: Edmonia Lynch, DO  Chief Complaint: Constipation and left foot pain.  HPI: Rebekah Johnson is a 59 y.o. female who presents with diabetic insensate neuropathy she is status post endovascular revascularization September 20 with vascular vein surgery.  Patient states that her foot does feel better but is still painful.  Past Medical History:  Diagnosis Date  . Acute congestive heart failure ([place])   . Acute diastolic CHF (congestive heart failure) ([place]) 09/14/2016  . Atrophic vaginitis 06/07/2012  . Avitaminosis D 07/28/2012  . CAD (coronary artery disease)    a. NSTEM 09/2016: cath showing severe diffuse disease of the RCA, ramus and Cx with mild-mod disease of LAD; PCI would require multiple stents and significant contrast usage thus medical therapy recommended.  . Cellulitis 07/12/2015  . Chest pain 01/01/2017  . Chronic diastolic CHF (congestive heart failure) ([place])   . Chronic kidney disease 09/23/2016  . CKD (chronic kidney disease), stage IV ([place])   . Constipation   . COPD GOLD  0 08/31/2017   Quit smoking 07/17/17 - Spirometry 09/01/2017  FEV1 1.23 (58%)  Ratio 57 with mild curvature p no rx - 09/01/2017  After extensive coaching inhaler device,  effectiveness =    75% with elipta with cough provoked  - PFT's  10/14/2017  FEV1 1.69 (68 % ) ratio 84  p no % improvement from saba p nothing prior to study with DLCO  50 % corrects to 76  % for alv volume   - 10/14/2017  Walked RA x 3 laps @ 1  . Current tobacco use 06/10/2012  . Diabetes mellitus with neuropathy ([place])   . Diabetic foot ulcer ([place]) 07/13/2015  . Diabetic polyneuropathy associated with type 2 diabetes mellitus ([place]) 08/19/2017  . DOE (dyspnea on exertion) 09/01/2017   09/01/2017  Walked RA x 3 laps @ 185 ft each stopped due to  End of study, nl to mod fast  pace, no  desat   But stopped to rest x 2  Spirometry 09/01/2017  FEV1 1.23 (58%)  Ratio 57  > trial of anoro    . Dyspnea     with exertion  . ESRD (end stage renal disease) ([place])    MWF - [facility]  . Essential (primary) hypertension 06/10/2012  . Foot amputation status    a. h/o left foot transmetatarsal amputation  . Headache    Migraines  . High cholesterol   . History of biliary T-tube placement 06/10/2012   Patient unaware   . History of cardiac arrest   . HLD (hyperlipidemia) 09/02/2012   Overview:  ICD-10 cut over    . Hypertension    a. patent renal arteries by PV angio 08/2015. b. heavy proteinuria 09/2016 ? nephrotic.  [name] Hypertensive heart disease with heart failure ([place])   . Hypertriglyceridemia 07/28/2012  . Leg pain 06/10/2012  . Normocytic anemia    pt denies this dx  . NSTEMI (non-ST elevated myocardial infarction) ([place]) 01/01/2017  . Obesity   . Onychomycosis 12/24/2015  . Peripheral nerve disease 09/02/2012   legs  . Peripheral vascular disease ([place]) 07/28/2012  . Pneumonia 2018  . Proteinuria   . PVD (peripheral vascular disease) ([place])    a. status post left SFA and popliteal stent and left SFA PTA with drug coated balloon in 08/2015.  . Status post transmetatarsal amputation of foot, left ([place]) 09/06/2015  . Tobacco abuse    quit cigarettes 2019, smokes marijuana  .  Type 2 diabetes mellitus ([place]) 06/07/2012  . Type 2 diabetes mellitus with peripheral angiopathy ([place]) 09/02/2012   Past Surgical History:  Procedure Laterality Date  . ABDOMINAL AORTOGRAM W/LOWER EXTREMITY Left 09/25/2019   Procedure: ABDOMINAL AORTOGRAM W/LOWER EXTREMITY;  Surgeon: Waynetta Sandy, MD;  Location: [facility];  Service: Cardiovascular;  Laterality: Left;  . ABDOMINAL HYSTERECTOMY    . AMPUTATION Left 09/06/2015   Procedure: LEFT TRANSMETATARSAL AMPUTATTION;  Surgeon: Newt Minion, MD;  Location: [facility];  Service: Orthopedics;  Laterality: Left;  . AV FISTULA PLACEMENT Left 12/13/2018   Procedure: ARTERIOVENOUS (AV) FISTULA CREATION LEFT UPPER ARM;  Surgeon: Waynetta Sandy, MD;   Location: [facility];  Service: Vascular;  Laterality: Left;  . BASCILIC VEIN TRANSPOSITION Left 03/30/2019   Procedure: LEFT ARM ARTERIOVENOUS VEIN TRANSPOSITION;  Surgeon: Serafina Mitchell, MD;  Location: [facility];  Service: Vascular;  Laterality: Left;  . COLONOSCOPY     polyps x 1  . LEFT HEART CATH AND CORONARY ANGIOGRAPHY N/A 09/17/2016   Procedure: LEFT HEART CATH AND CORONARY ANGIOGRAPHY;  Surgeon: Jettie Booze, MD;  Location: [facility];  Service: Cardiovascular;  Laterality: N/A;  . PERIPHERAL VASCULAR ATHERECTOMY Left 09/25/2019   Procedure: PERIPHERAL VASCULAR ATHERECTOMY;  Surgeon: Waynetta Sandy, MD;  Location: [facility];  Service: Cardiovascular;  Laterality: Left;  SFA  . PERIPHERAL VASCULAR CATHETERIZATION N/A 08/16/2015   Procedure: Abdominal Aortogram;  Surgeon: Elam Dutch, MD;  Location: [facility];  Service: Cardiovascular;  Laterality: N/A;  . PERIPHERAL VASCULAR CATHETERIZATION Bilateral 08/16/2015   Procedure: Lower Extremity Angiography;  Surgeon: Elam Dutch, MD;  Location: [facility];  Service: Cardiovascular;  Laterality: Bilateral;  . PERIPHERAL VASCULAR CATHETERIZATION Left 08/16/2015   Procedure: Peripheral Vascular Intervention;  Surgeon: Elam Dutch, MD;  Location: [facility];  Service: Cardiovascular;  Laterality: Left;  SFA STENT X 2  . PERIPHERAL VASCULAR INTERVENTION Left 09/25/2019   Procedure: PERIPHERAL VASCULAR INTERVENTION;  Surgeon: Waynetta Sandy, MD;  Location: [facility];  Service: Cardiovascular;  Laterality: Left;  SFA   Social History   Socioeconomic History  . Marital status: Legally Separated    Spouse name: Not on file  . Number of children: Not on file  . Years of education: Not on file  . Highest education level: Not on file  Occupational History  . Not on file  Tobacco Use  . Smoking status: Former Smoker    Packs/day: 1.00    Years: 42.00    Pack  years: 42.00    Types: Cigarettes    Quit date: 07/17/2017    Years since quitting: 2.2  . Smokeless tobacco: Never Used  Vaping Use  . Vaping Use: Never used  Substance and Sexual Activity  . Alcohol use: Not Currently    Alcohol/week: 1.0 - 2.0 standard drink    Types: 1 - 2 Glasses of wine per week    Comment: on social occassions  . Drug use: Yes    Frequency: 7.0 times per week    Types: Marijuana    Comment: daily use - last use 03/28/19  . Sexual activity: Not on file    Comment: Hysterectomy  Other Topics Concern  . Not on file  Social History Narrative  . Not on file   Social Determinants of Health   Financial Resource Strain:   . Difficulty of Paying Living Expenses: Not on file  Food Insecurity:   . Worried About Charity fundraiser in the Last Year: Not on file  . Ran Out of Food in the Last Year: Not on file  Transportation Needs:   . Lack of Transportation (Medical): Not on file  . Lack of Transportation (Non-Medical): Not on file  Physical Activity:   . Days of Exercise per Week: Not on file  . Minutes of Exercise per Session: Not on file  Stress:   . Feeling of Stress : Not on file  Social Connections:   . Frequency of Communication with Friends and Family: Not on file  . Frequency of Social Gatherings with Friends and Family: Not on file  . Attends Religious Services: Not on file  . Active Member of Clubs or Organizations: Not on file  . Attends Archivist Meetings: Not on file  . Marital Status: Not on file   Family History  Problem Relation Age of Onset  . Diabetes Mother   . Hypertension Father   . Colon cancer Neg Hx   . Stomach cancer Neg Hx   . Pancreatic cancer Neg Hx    - negative except otherwise stated in the family history section No Known Allergies Prior to Admission medications   Medication Sig Start Date End Date Taking? Authorizing Provider  acetaminophen (TYLENOL) 500 MG tablet Take 1,000 mg by mouth every 6 (six)  hours as needed for moderate pain or headache.    [provider]  amLODipine (NORVASC) 5 MG tablet Take 1 tablet (5 mg total) by mouth daily. 08/22/19   Dorothy Spark, MD  Ascorbic Acid (VITAMIN C) 1000 MG tablet Take 1,000 mg by mouth daily.    [provider]  aspirin 81 MG EC tablet Take 1 tablet (81 mg total) by mouth daily. Reported on 07/13/2015 07/02/16   Charlott Rakes, MD  atorvastatin (LIPITOR) 80 MG tablet Take 1 tablet (80 mg total) by mouth daily. 10/07/17   Scot Jun, FNP  Blood Glucose Monitoring Suppl (ACCU-CHEK AVIVA) device Use as instructed three times daily. Patient taking differently: Use as instructed twice times daily. 04/22/16   Charlott Rakes, MD  carvedilol (COREG) 25 MG tablet Take 1 tablet (25 mg total) by mouth 2 (two) times daily. 10/07/17   Scot Jun, FNP  cholecalciferol (VITAMIN D3) 25 MCG (1000 UT) tablet Take 1,000 Units by mouth daily.    [provider]  clopidogrel (PLAVIX) 75 MG tablet Take 1 tablet (75 mg total) by mouth daily. 10/07/17   Scot Jun, FNP  docusate sodium (COLACE) 100 MG capsule Take 200 mg by mouth 2 (two) times daily.     [provider]  doxycycline (VIBRA-TABS) 100 MG tablet Take 1 tablet (100 mg total) by mouth daily. 09/28/19   Waynetta Sandy, MD  furosemide (LASIX) 80 MG tablet Take 80 mg by mouth 2 (two) times daily. 03/21/18   [provider]  gabapentin (NEURONTIN) 300 MG capsule Take 1 capsule (300 mg total) by mouth 3 (three) times daily. 10/07/17   Scot Jun, FNP  glipiZIDE (GLUCOTROL) 5 MG tablet Take 0.5 tablets (2.5 mg total) by mouth daily before breakfast. Patient taking differently: Take 5 mg by mouth daily before breakfast.  11/01/18   Renato Shin, MD  glucose blood (ACCU-CHEK AVIVA) test strip Use as instructed three times daily before meals. 10/14/17   Charlott Rakes, MD  hydrALAZINE (APRESOLINE) 50 MG tablet Take 50 mg by mouth 3  (three)  times daily.    [provider]  HYDROcodone-acetaminophen (NORCO/VICODIN) 5-325 MG tablet Take 1 tablet by mouth every 6 (six) hours as needed for moderate pain. 08/08/19   Persons, Bevely Palmer, PA  isosorbide mononitrate (IMDUR) 30 MG 24 hr tablet Take 60 mg by mouth daily. 03/22/18   [provider]  lactulose (CHRONULAC) 10 GM/15ML solution Take 15 mLs (10 g total) by mouth 3 (three) times daily. 03/21/19   Lawyer, Harrell Gave, PA-C  Lancet Devices [facility]) lancets Use as instructed three times daily before meals. 04/22/16   Charlott Rakes, MD  latanoprost (XALATAN) 0.005 % ophthalmic solution Place 1 drop into both eyes at bedtime.     [provider]  linaclotide Rolan Lipa) 145 MCG CAPS capsule Take 1 capsule (145 mcg total) by mouth daily before breakfast. Take ~ 30 minutes before breakfast, on an empty stomach 10/09/19   Sponseller, Rebekah R, PA-C  nitroGLYCERIN (NITRODUR - DOSED IN MG/24 HR) 0.2 mg/hr patch Place 1 patch (0.2 mg total) onto the skin daily. 08/15/19   Newt Minion, MD  nitroGLYCERIN (NITROSTAT) 0.4 MG SL tablet Place 1 tablet (0.4 mg total) under the tongue every 5 (five) minutes as needed for chest pain. 01/18/17 03/21/19  Dorothy Spark, MD  Omega-3 Fatty Acids (FISH OIL) 1000 MG CAPS Take 1,000 mg by mouth daily.     [provider]  oxyCODONE-acetaminophen (PERCOCET/ROXICET) 5-325 MG tablet Take 1 tablet by mouth every 4 (four) hours as needed for severe pain. 09/15/19   Waynetta Sandy, MD  ranolazine (RANEXA) 500 MG 12 hr tablet Take 1 tablet by mouth twice daily 07/04/19   Dorothy Spark, MD  sevelamer carbonate (RENVELA) 800 MG tablet Take 800 mg by mouth See admin instructions. Three times daily with meals and with snack.    [provider]  silver sulfADIAZINE (SILVADENE) 1 % cream Apply 1 application topically daily. Apply to affected area daily plus dry dressing Patient taking differently:  Apply 1 application topically daily as needed (wound care).  07/11/18   Newt Minion, MD  sitaGLIPtin (JANUVIA) 25 MG tablet Take 1 tablet (25 mg total) by mouth daily. 10/07/17   Scot Jun, FNP  ZOFRAN 4 MG tablet Take 4 mg by mouth every 8 (eight) hours as needed for nausea/vomiting. 03/08/19   [provider]   CT Abdomen Pelvis W Contrast  Result Date: 10/11/2019 CLINICAL DATA:  Abdominal and rectal pain. EXAM: CT ABDOMEN AND PELVIS WITH CONTRAST TECHNIQUE: Multidetector CT imaging of the abdomen and pelvis was performed using the standard protocol following bolus administration of intravenous contrast. CONTRAST:  75 mL OMNIPAQUE IOHEXOL 300 MG/ML  SOLN COMPARISON:  CT abdomen and pelvis 03/21/2019. FINDINGS: Lower chest: Lung bases clear. No pleural or pericardial effusion. Extensive calcific coronary atherosclerosis noted. Hepatobiliary: No focal liver abnormality is seen. No gallstones, gallbladder wall thickening, or biliary dilatation. Pancreas: Unremarkable. No pancreatic ductal dilatation or surrounding inflammatory changes. Spleen: Normal in size without focal abnormality. Adrenals/Urinary Tract: Adrenal glands are unremarkable. Kidneys are normal, without renal calculi, focal lesion, or hydronephrosis. Bladder is unremarkable. Stomach/Bowel: There is a large volume of stool in the rectosigmoid colon. Walls of the rectum are thickened without focal abnormality. No pneumatosis, free air or portal venous gas. The stomach, small bowel and appendix appear normal. Vascular/Lymphatic: Aortic atherosclerosis. No enlarged abdominal or pelvic lymph nodes. Reproductive: Status post hysterectomy. No adnexal masses. Other: Negative. Musculoskeletal: No acute or focal bony abnormality. IMPRESSION: Findings compatible  with proctitis. Large volume of stool in the rectosigmoid colon noted. Aortic Atherosclerosis (ICD10-I70.0). Extensive calcific coronary atherosclerosis also noted. Electronically  Signed   By: Inge Rise M.D.   On: 10/11/2019 14:36   DG Foot 2 Views Left  Result Date: 10/11/2019 CLINICAL DATA:  Lateral left foot ulcer, pain EXAM: LEFT FOOT - 2 VIEW COMPARISON:  07/07/2017 FINDINGS: Patient is status post transmetatarsal amputation of the left first-fifth rays. Subtle cortical erosion of the proximal aspect of the fifth metatarsal base where there is focal osteopenia. There is soft tissue swelling and air within the adjacent lateral and plantar soft tissues suggesting ulceration. No additional sites of bony erosion are identified. Prominent vascular calcifications. IMPRESSION: 1. Subtle cortical erosion of the proximal aspect of the fifth metatarsal base highly suspicious for acute osteomyelitis. 2. Soft tissue swelling and air within the adjacent lateral and plantar soft tissues suggesting ulceration. Soft tissue infection with a gas-forming organism is not excluded. Electronically Signed   By: Davina Poke D.O.   On: 10/11/2019 13:11   - pertinent xrays, CT, MRI studies were reviewed and independently interpreted  Positive ROS: All other systems have been reviewed and were otherwise negative with the exception of those mentioned in the HPI and as above.  Physical Exam: General: Alert, no acute distress Psychiatric: Patient is competent for consent with normal mood and affect Lymphatic: No axillary or cervical lymphadenopathy Cardiovascular: No pedal edema Respiratory: No cyanosis, no use of accessory musculature GI: No organomegaly, abdomen is soft and non-tender    Images:  @ENCIMAGES @  Labs:  Lab Results  Component Value Date   HGBA1C 6.8 (H) 10/11/2019   HGBA1C 6.9 (A) 02/02/2019   HGBA1C 5.6 11/01/2018   ESRSEDRATE 57 (H) 07/13/2015   CRP 0.7 07/13/2015   LABURIC 5.7 07/13/2015   REPTSTATUS 01/04/2017 FINAL 01/04/2017   CULT (A) 01/01/2017    VIRIDANS STREPTOCOCCUS THE SIGNIFICANCE OF ISOLATING THIS ORGANISM FROM A SINGLE SET OF BLOOD  CULTURES WHEN MULTIPLE SETS ARE DRAWN IS UNCERTAIN. PLEASE NOTIFY THE MICROBIOLOGY DEPARTMENT WITHIN ONE WEEK IF SPECIATION AND SENSITIVITIES ARE REQUIRED. Performed at [facility], [place] [address] [place], [place] [address]     Lab Results  Component Value Date   ALBUMIN 2.9 (L) 10/11/2019   ALBUMIN 3.6 04/27/2019   ALBUMIN 3.6 03/21/2019   LABURIC 5.7 07/13/2015    Neurologic: Patient does not have protective sensation bilateral lower extremities.   MUSCULOSKELETAL:   Skin: Examination over the plantar lateral aspect of the left foot patient has large area of blistering skin with ischemic skin deep to this blister.  Patient has pain to light touch there is no purulent drainage no clinical signs of an abscess.  I cannot palpate a pulse.  Review of the radiographs of the left foot shows air in the soft tissue plantar laterally consistent where the ulcer is no ascending air in the soft tissue no definite osteomyelitis.  Ankle-brachial indices are pending.  Assessment: Assessment: Diabetic insensate neuropathy with peripheral vascular disease, 3 weeks status post revascularization to the left lower extremity with ischemic ulcer and blistering over the plantar lateral aspect of the left foot.  Plan: Plan: With the size of the ulcer over the plantar lateral aspect the left foot, status post transmetatarsal amputation, patient does not have any foot salvage surgical intervention options at this time, would have vascular surgery reevaluate after ankle-brachial indices were obtained.  I'll follow-up in the office after discharge, and will follow while she  is in the hospital.  Thank you for the consult and the opportunity to see Ms. Pepin  [name], MD [facility] [phone] 7:39 AM

## 2019-10-12 NOTE — Consult Note (Signed)
ORTHOPAEDIC CONSULTATION  REQUESTING PHYSICIAN: Edmonia Lynch, DO  Chief Complaint: Constipation and left foot pain.  HPI: Rebekah Johnson is a 59 y.o. female who presents with diabetic insensate neuropathy she is status post endovascular revascularization September 20 with vascular vein surgery.  Patient states that her foot does feel better but is still painful.  Past Medical History:  Diagnosis Date  . Acute congestive heart failure ([place])   . Acute diastolic CHF (congestive heart failure) ([place]) 09/14/2016  . Atrophic vaginitis 06/07/2012  . Avitaminosis D 07/28/2012  . CAD (coronary artery disease)    a. NSTEM 09/2016: cath showing severe diffuse disease of the RCA, ramus and Cx with mild-mod disease of LAD; PCI would require multiple stents and significant contrast usage thus medical therapy recommended.  . Cellulitis 07/12/2015  . Chest pain 01/01/2017  . Chronic diastolic CHF (congestive heart failure) ([place])   . Chronic kidney disease 09/23/2016  . CKD (chronic kidney disease), stage IV ([place])   . Constipation   . COPD GOLD  0 08/31/2017   Quit smoking 07/17/17 - Spirometry 09/01/2017  FEV1 1.23 (58%)  Ratio 57 with mild curvature p no rx - 09/01/2017  After extensive coaching inhaler device,  effectiveness =    75% with elipta with cough provoked  - PFT's  10/14/2017  FEV1 1.69 (68 % ) ratio 84  p no % improvement from saba p nothing prior to study with DLCO  50 % corrects to 76  % for alv volume   - 10/14/2017  Walked RA x 3 laps @ 1  . Current tobacco use 06/10/2012  . Diabetes mellitus with neuropathy ([place])   . Diabetic foot ulcer ([place]) 07/13/2015  . Diabetic polyneuropathy associated with type 2 diabetes mellitus ([place]) 08/19/2017  . DOE (dyspnea on exertion) 09/01/2017   09/01/2017  Walked RA x 3 laps @ 185 ft each stopped due to  End of study, nl to mod fast  pace, no  desat   But stopped to rest x 2  Spirometry 09/01/2017  FEV1 1.23 (58%)  Ratio 57  > trial of anoro    . Dyspnea     with exertion  . ESRD (end stage renal disease) ([place])    MWF - [facility]  . Essential (primary) hypertension 06/10/2012  . Foot amputation status    a. h/o left foot transmetatarsal amputation  . Headache    Migraines  . High cholesterol   . History of biliary T-tube placement 06/10/2012   Patient unaware   . History of cardiac arrest   . HLD (hyperlipidemia) 09/02/2012   Overview:  ICD-10 cut over    . Hypertension    a. patent renal arteries by PV angio 08/2015. b. heavy proteinuria 09/2016 ? nephrotic.  [name] Hypertensive heart disease with heart failure ([place])   . Hypertriglyceridemia 07/28/2012  . Leg pain 06/10/2012  . Normocytic anemia    pt denies this dx  . NSTEMI (non-ST elevated myocardial infarction) ([place]) 01/01/2017  . Obesity   . Onychomycosis 12/24/2015  . Peripheral nerve disease 09/02/2012   legs  . Peripheral vascular disease ([place]) 07/28/2012  . Pneumonia 2018  . Proteinuria   . PVD (peripheral vascular disease) ([place])    a. status post left SFA and popliteal stent and left SFA PTA with drug coated balloon in 08/2015.  . Status post transmetatarsal amputation of foot, left ([place]) 09/06/2015  . Tobacco abuse    quit cigarettes 2019, smokes marijuana  .  Type 2 diabetes mellitus ([place]) 06/07/2012  . Type 2 diabetes mellitus with peripheral angiopathy ([place]) 09/02/2012   Past Surgical History:  Procedure Laterality Date  . ABDOMINAL AORTOGRAM W/LOWER EXTREMITY Left 09/25/2019   Procedure: ABDOMINAL AORTOGRAM W/LOWER EXTREMITY;  Surgeon: Waynetta Sandy, MD;  Location: [facility];  Service: Cardiovascular;  Laterality: Left;  . ABDOMINAL HYSTERECTOMY    . AMPUTATION Left 09/06/2015   Procedure: LEFT TRANSMETATARSAL AMPUTATTION;  Surgeon: Newt Minion, MD;  Location: [facility];  Service: Orthopedics;  Laterality: Left;  . AV FISTULA PLACEMENT Left 12/13/2018   Procedure: ARTERIOVENOUS (AV) FISTULA CREATION LEFT UPPER ARM;  Surgeon: Waynetta Sandy, MD;   Location: [facility];  Service: Vascular;  Laterality: Left;  . BASCILIC VEIN TRANSPOSITION Left 03/30/2019   Procedure: LEFT ARM ARTERIOVENOUS VEIN TRANSPOSITION;  Surgeon: Serafina Mitchell, MD;  Location: [facility];  Service: Vascular;  Laterality: Left;  . COLONOSCOPY     polyps x 1  . LEFT HEART CATH AND CORONARY ANGIOGRAPHY N/A 09/17/2016   Procedure: LEFT HEART CATH AND CORONARY ANGIOGRAPHY;  Surgeon: Jettie Booze, MD;  Location: [facility];  Service: Cardiovascular;  Laterality: N/A;  . PERIPHERAL VASCULAR ATHERECTOMY Left 09/25/2019   Procedure: PERIPHERAL VASCULAR ATHERECTOMY;  Surgeon: Waynetta Sandy, MD;  Location: [facility];  Service: Cardiovascular;  Laterality: Left;  SFA  . PERIPHERAL VASCULAR CATHETERIZATION N/A 08/16/2015   Procedure: Abdominal Aortogram;  Surgeon: Elam Dutch, MD;  Location: [facility];  Service: Cardiovascular;  Laterality: N/A;  . PERIPHERAL VASCULAR CATHETERIZATION Bilateral 08/16/2015   Procedure: Lower Extremity Angiography;  Surgeon: Elam Dutch, MD;  Location: [facility];  Service: Cardiovascular;  Laterality: Bilateral;  . PERIPHERAL VASCULAR CATHETERIZATION Left 08/16/2015   Procedure: Peripheral Vascular Intervention;  Surgeon: Elam Dutch, MD;  Location: [facility];  Service: Cardiovascular;  Laterality: Left;  SFA STENT X 2  . PERIPHERAL VASCULAR INTERVENTION Left 09/25/2019   Procedure: PERIPHERAL VASCULAR INTERVENTION;  Surgeon: Waynetta Sandy, MD;  Location: [facility];  Service: Cardiovascular;  Laterality: Left;  SFA   Social History   Socioeconomic History  . Marital status: Legally Separated    Spouse name: Not on file  . Number of children: Not on file  . Years of education: Not on file  . Highest education level: Not on file  Occupational History  . Not on file  Tobacco Use  . Smoking status: Former Smoker    Packs/day: 1.00    Years: 42.00    Pack  years: 42.00    Types: Cigarettes    Quit date: 07/17/2017    Years since quitting: 2.2  . Smokeless tobacco: Never Used  Vaping Use  . Vaping Use: Never used  Substance and Sexual Activity  . Alcohol use: Not Currently    Alcohol/week: 1.0 - 2.0 standard drink    Types: 1 - 2 Glasses of wine per week    Comment: on social occassions  . Drug use: Yes    Frequency: 7.0 times per week    Types: Marijuana    Comment: daily use - last use 03/28/19  . Sexual activity: Not on file    Comment: Hysterectomy  Other Topics Concern  . Not on file  Social History Narrative  . Not on file   Social Determinants of Health   Financial Resource Strain:   . Difficulty of Paying Living Expenses: Not on file  Food Insecurity:   . Worried About Charity fundraiser in the Last Year: Not on file  . Ran Out of Food in the Last Year: Not on file  Transportation Needs:   . Lack of Transportation (Medical): Not on file  . Lack of Transportation (Non-Medical): Not on file  Physical Activity:   . Days of Exercise per Week: Not on file  . Minutes of Exercise per Session: Not on file  Stress:   . Feeling of Stress : Not on file  Social Connections:   . Frequency of Communication with Friends and Family: Not on file  . Frequency of Social Gatherings with Friends and Family: Not on file  . Attends Religious Services: Not on file  . Active Member of Clubs or Organizations: Not on file  . Attends Archivist Meetings: Not on file  . Marital Status: Not on file   Family History  Problem Relation Age of Onset  . Diabetes Mother   . Hypertension Father   . Colon cancer Neg Hx   . Stomach cancer Neg Hx   . Pancreatic cancer Neg Hx    - negative except otherwise stated in the family history section No Known Allergies Prior to Admission medications   Medication Sig Start Date End Date Taking? Authorizing Provider  acetaminophen (TYLENOL) 500 MG tablet Take 1,000 mg by mouth every 6 (six)  hours as needed for moderate pain or headache.    [provider]  amLODipine (NORVASC) 5 MG tablet Take 1 tablet (5 mg total) by mouth daily. 08/22/19   Dorothy Spark, MD  Ascorbic Acid (VITAMIN C) 1000 MG tablet Take 1,000 mg by mouth daily.    [provider]  aspirin 81 MG EC tablet Take 1 tablet (81 mg total) by mouth daily. Reported on 07/13/2015 07/02/16   Charlott Rakes, MD  atorvastatin (LIPITOR) 80 MG tablet Take 1 tablet (80 mg total) by mouth daily. 10/07/17   Scot Jun, FNP  Blood Glucose Monitoring Suppl (ACCU-CHEK AVIVA) device Use as instructed three times daily. Patient taking differently: Use as instructed twice times daily. 04/22/16   Charlott Rakes, MD  carvedilol (COREG) 25 MG tablet Take 1 tablet (25 mg total) by mouth 2 (two) times daily. 10/07/17   Scot Jun, FNP  cholecalciferol (VITAMIN D3) 25 MCG (1000 UT) tablet Take 1,000 Units by mouth daily.    [provider]  clopidogrel (PLAVIX) 75 MG tablet Take 1 tablet (75 mg total) by mouth daily. 10/07/17   Scot Jun, FNP  docusate sodium (COLACE) 100 MG capsule Take 200 mg by mouth 2 (two) times daily.     [provider]  doxycycline (VIBRA-TABS) 100 MG tablet Take 1 tablet (100 mg total) by mouth daily. 09/28/19   Waynetta Sandy, MD  furosemide (LASIX) 80 MG tablet Take 80 mg by mouth 2 (two) times daily. 03/21/18   [provider]  gabapentin (NEURONTIN) 300 MG capsule Take 1 capsule (300 mg total) by mouth 3 (three) times daily. 10/07/17   Scot Jun, FNP  glipiZIDE (GLUCOTROL) 5 MG tablet Take 0.5 tablets (2.5 mg total) by mouth daily before breakfast. Patient taking differently: Take 5 mg by mouth daily before breakfast.  11/01/18   Renato Shin, MD  glucose blood (ACCU-CHEK AVIVA) test strip Use as instructed three times daily before meals. 10/14/17   Charlott Rakes, MD  hydrALAZINE (APRESOLINE) 50 MG tablet Take 50 mg by mouth 3  (three)  times daily.    [provider]  HYDROcodone-acetaminophen (NORCO/VICODIN) 5-325 MG tablet Take 1 tablet by mouth every 6 (six) hours as needed for moderate pain. 08/08/19   Persons, Bevely Palmer, PA  isosorbide mononitrate (IMDUR) 30 MG 24 hr tablet Take 60 mg by mouth daily. 03/22/18   [provider]  lactulose (CHRONULAC) 10 GM/15ML solution Take 15 mLs (10 g total) by mouth 3 (three) times daily. 03/21/19   Lawyer, Harrell Gave, PA-C  Lancet Devices [facility]) lancets Use as instructed three times daily before meals. 04/22/16   Charlott Rakes, MD  latanoprost (XALATAN) 0.005 % ophthalmic solution Place 1 drop into both eyes at bedtime.     [provider]  linaclotide Rolan Lipa) 145 MCG CAPS capsule Take 1 capsule (145 mcg total) by mouth daily before breakfast. Take ~ 30 minutes before breakfast, on an empty stomach 10/09/19   Sponseller, Rebekah R, PA-C  nitroGLYCERIN (NITRODUR - DOSED IN MG/24 HR) 0.2 mg/hr patch Place 1 patch (0.2 mg total) onto the skin daily. 08/15/19   Newt Minion, MD  nitroGLYCERIN (NITROSTAT) 0.4 MG SL tablet Place 1 tablet (0.4 mg total) under the tongue every 5 (five) minutes as needed for chest pain. 01/18/17 03/21/19  Dorothy Spark, MD  Omega-3 Fatty Acids (FISH OIL) 1000 MG CAPS Take 1,000 mg by mouth daily.     [provider]  oxyCODONE-acetaminophen (PERCOCET/ROXICET) 5-325 MG tablet Take 1 tablet by mouth every 4 (four) hours as needed for severe pain. 09/15/19   Waynetta Sandy, MD  ranolazine (RANEXA) 500 MG 12 hr tablet Take 1 tablet by mouth twice daily 07/04/19   Dorothy Spark, MD  sevelamer carbonate (RENVELA) 800 MG tablet Take 800 mg by mouth See admin instructions. Three times daily with meals and with snack.    [provider]  silver sulfADIAZINE (SILVADENE) 1 % cream Apply 1 application topically daily. Apply to affected area daily plus dry dressing Patient taking differently:  Apply 1 application topically daily as needed (wound care).  07/11/18   Newt Minion, MD  sitaGLIPtin (JANUVIA) 25 MG tablet Take 1 tablet (25 mg total) by mouth daily. 10/07/17   Scot Jun, FNP  ZOFRAN 4 MG tablet Take 4 mg by mouth every 8 (eight) hours as needed for nausea/vomiting. 03/08/19   [provider]   CT Abdomen Pelvis W Contrast  Result Date: 10/11/2019 CLINICAL DATA:  Abdominal and rectal pain. EXAM: CT ABDOMEN AND PELVIS WITH CONTRAST TECHNIQUE: Multidetector CT imaging of the abdomen and pelvis was performed using the standard protocol following bolus administration of intravenous contrast. CONTRAST:  75 mL OMNIPAQUE IOHEXOL 300 MG/ML  SOLN COMPARISON:  CT abdomen and pelvis 03/21/2019. FINDINGS: Lower chest: Lung bases clear. No pleural or pericardial effusion. Extensive calcific coronary atherosclerosis noted. Hepatobiliary: No focal liver abnormality is seen. No gallstones, gallbladder wall thickening, or biliary dilatation. Pancreas: Unremarkable. No pancreatic ductal dilatation or surrounding inflammatory changes. Spleen: Normal in size without focal abnormality. Adrenals/Urinary Tract: Adrenal glands are unremarkable. Kidneys are normal, without renal calculi, focal lesion, or hydronephrosis. Bladder is unremarkable. Stomach/Bowel: There is a large volume of stool in the rectosigmoid colon. Walls of the rectum are thickened without focal abnormality. No pneumatosis, free air or portal venous gas. The stomach, small bowel and appendix appear normal. Vascular/Lymphatic: Aortic atherosclerosis. No enlarged abdominal or pelvic lymph nodes. Reproductive: Status post hysterectomy. No adnexal masses. Other: Negative. Musculoskeletal: No acute or focal bony abnormality. IMPRESSION: Findings compatible  with proctitis. Large volume of stool in the rectosigmoid colon noted. Aortic Atherosclerosis (ICD10-I70.0). Extensive calcific coronary atherosclerosis also noted. Electronically  Signed   By: Inge Rise M.D.   On: 10/11/2019 14:36   DG Foot 2 Views Left  Result Date: 10/11/2019 CLINICAL DATA:  Lateral left foot ulcer, pain EXAM: LEFT FOOT - 2 VIEW COMPARISON:  07/07/2017 FINDINGS: Patient is status post transmetatarsal amputation of the left first-fifth rays. Subtle cortical erosion of the proximal aspect of the fifth metatarsal base where there is focal osteopenia. There is soft tissue swelling and air within the adjacent lateral and plantar soft tissues suggesting ulceration. No additional sites of bony erosion are identified. Prominent vascular calcifications. IMPRESSION: 1. Subtle cortical erosion of the proximal aspect of the fifth metatarsal base highly suspicious for acute osteomyelitis. 2. Soft tissue swelling and air within the adjacent lateral and plantar soft tissues suggesting ulceration. Soft tissue infection with a gas-forming organism is not excluded. Electronically Signed   By: Davina Poke D.O.   On: 10/11/2019 13:11   - pertinent xrays, CT, MRI studies were reviewed and independently interpreted  Positive ROS: All other systems have been reviewed and were otherwise negative with the exception of those mentioned in the HPI and as above.  Physical Exam: General: Alert, no acute distress Psychiatric: Patient is competent for consent with normal mood and affect Lymphatic: No axillary or cervical lymphadenopathy Cardiovascular: No pedal edema Respiratory: No cyanosis, no use of accessory musculature GI: No organomegaly, abdomen is soft and non-tender    Images:  @ENCIMAGES @  Labs:  Lab Results  Component Value Date   HGBA1C 6.8 (H) 10/11/2019   HGBA1C 6.9 (A) 02/02/2019   HGBA1C 5.6 11/01/2018   ESRSEDRATE 57 (H) 07/13/2015   CRP 0.7 07/13/2015   LABURIC 5.7 07/13/2015   REPTSTATUS 01/04/2017 FINAL 01/04/2017   CULT (A) 01/01/2017    VIRIDANS STREPTOCOCCUS THE SIGNIFICANCE OF ISOLATING THIS ORGANISM FROM A SINGLE SET OF BLOOD  CULTURES WHEN MULTIPLE SETS ARE DRAWN IS UNCERTAIN. PLEASE NOTIFY THE MICROBIOLOGY DEPARTMENT WITHIN ONE WEEK IF SPECIATION AND SENSITIVITIES ARE REQUIRED. Performed at [facility], [place] [address] [place], [place] [address]     Lab Results  Component Value Date   ALBUMIN 2.9 (L) 10/11/2019   ALBUMIN 3.6 04/27/2019   ALBUMIN 3.6 03/21/2019   LABURIC 5.7 07/13/2015    Neurologic: Patient does not have protective sensation bilateral lower extremities.   MUSCULOSKELETAL:   Skin: Examination over the plantar lateral aspect of the left foot patient has large area of blistering skin with ischemic skin deep to this blister.  Patient has pain to light touch there is no purulent drainage no clinical signs of an abscess.  I cannot palpate a pulse.  Review of the radiographs of the left foot shows air in the soft tissue plantar laterally consistent where the ulcer is no ascending air in the soft tissue no definite osteomyelitis.  Ankle-brachial indices are pending.  Assessment: Assessment: Diabetic insensate neuropathy with peripheral vascular disease, 3 weeks status post revascularization to the left lower extremity with ischemic ulcer and blistering over the plantar lateral aspect of the left foot.  Plan: Plan: With the size of the ulcer over the plantar lateral aspect the left foot, status post transmetatarsal amputation, patient does not have any foot salvage surgical intervention options at this time, would have vascular surgery reevaluate after ankle-brachial indices were obtained.  I'll follow-up in the office after discharge, and will follow while she  is in the hospital.  Thank you for the consult and the opportunity to see Ms. Finfrock  [name], MD [facility] [phone] 7:39 AM

## 2019-10-13 DIAGNOSIS — M868X7 Other osteomyelitis, ankle and foot: Secondary | ICD-10-CM

## 2019-10-13 DIAGNOSIS — I70244 Atherosclerosis of native arteries of left leg with ulceration of heel and midfoot: Secondary | ICD-10-CM

## 2019-10-13 DIAGNOSIS — Z992 Dependence on renal dialysis: Secondary | ICD-10-CM

## 2019-10-13 DIAGNOSIS — N186 End stage renal disease: Secondary | ICD-10-CM

## 2019-10-13 DIAGNOSIS — M869 Osteomyelitis, unspecified: Secondary | ICD-10-CM

## 2019-10-13 LAB — GLUCOSE, CAPILLARY
Glucose-Capillary: 104 mg/dL — ABNORMAL HIGH (ref 70–99)
Glucose-Capillary: 113 mg/dL — ABNORMAL HIGH (ref 70–99)
Glucose-Capillary: 86 mg/dL (ref 70–99)
Glucose-Capillary: 90 mg/dL (ref 70–99)
Glucose-Capillary: 91 mg/dL (ref 70–99)

## 2019-10-13 LAB — CBC WITH DIFFERENTIAL/PLATELET
Abs Immature Granulocytes: 0.04 10*3/uL (ref 0.00–0.07)
Basophils Absolute: 0.1 10*3/uL (ref 0.0–0.1)
Basophils Relative: 1 %
Eosinophils Absolute: 0.1 10*3/uL (ref 0.0–0.5)
Eosinophils Relative: 1 %
HCT: 31.1 % — ABNORMAL LOW (ref 36.0–46.0)
Hemoglobin: 9.5 g/dL — ABNORMAL LOW (ref 12.0–15.0)
Immature Granulocytes: 0 %
Lymphocytes Relative: 19 %
Lymphs Abs: 2 10*3/uL (ref 0.7–4.0)
MCH: 27.9 pg (ref 26.0–34.0)
MCHC: 30.5 g/dL (ref 30.0–36.0)
MCV: 91.2 fL (ref 80.0–100.0)
Monocytes Absolute: 0.9 10*3/uL (ref 0.1–1.0)
Monocytes Relative: 9 %
Neutro Abs: 7.1 10*3/uL (ref 1.7–7.7)
Neutrophils Relative %: 70 %
Platelets: 306 10*3/uL (ref 150–400)
RBC: 3.41 MIL/uL — ABNORMAL LOW (ref 3.87–5.11)
RDW: 16.3 % — ABNORMAL HIGH (ref 11.5–15.5)
WBC: 10.1 10*3/uL (ref 4.0–10.5)
nRBC: 0 % (ref 0.0–0.2)

## 2019-10-13 MED ORDER — KETOROLAC TROMETHAMINE 30 MG/ML IJ SOLN: 30.0000 mg | Freq: Once | INTRAMUSCULAR | Status: DC

## 2019-10-13 MED ORDER — CHLORHEXIDINE GLUCONATE CLOTH 2 % EX PADS
6.0000 | MEDICATED_PAD | Freq: Every day | CUTANEOUS | Status: DC
  Administered 2019-10-14 – 2019-10-17 (×4): 6 via TOPICAL

## 2019-10-13 MED ORDER — VANCOMYCIN HCL IN DEXTROSE 750-5 MG/150ML-% IV SOLN
750.0000 mg | INTRAVENOUS | Status: DC
  Filled 2019-10-13 (×4): qty 150

## 2019-10-13 NOTE — Progress Notes (Signed)
HD orders modified  To 10/14/2019 per Dr Jonnie Finner. Notified Primary RN Lattie Haw at [phone] of above

## 2019-10-13 NOTE — Progress Notes (Signed)
Patient continuing to complain of both pain in rectum and foot with complaints of constipation.  Patient given lactulose and Miralax only producing a "smear".  Given current constipation and pain notified Dr. Humphrey Rolls for recommendation to address both issues.  Requested alternative to Dilaudid given current constipation.

## 2019-10-13 NOTE — Progress Notes (Addendum)
Received call from [facility], RN in HD that dialysis will be on 10/9 and not today, held antihypertensive administered, contacted pharmacy to reschedule dose of Vancomycin.

## 2019-10-13 NOTE — Progress Notes (Signed)
[facility] Progress Note  Subjective: seen in room, L foot and rectum hurting  Vitals:   10/12/19 2338 10/13/19 0325 10/13/19 0752 10/13/19 1221  BP: (!) 152/58 134/61 (!) 154/56 (!) 135/54  Pulse: 71 67 73 65  Resp: 18 16 18 18   Temp: 98.3 F (36.8 C) 98.7 F (37.1 C) 98.6 F (37 C) 98.6 F (37 C)  TempSrc: Oral Oral Oral Oral  SpO2: 98% 100% 99% 98%  Weight:      Height:        Exam: General: WNWD woman in bed, nad  Head: NCAT sclera not icteric MMM Neck: Supple. No JVD No masses Lungs: CTA bilaterally  Heart: RRR with S1 S2 Abdomen: soft NT + BS Lower ext: L TMA with draining ulcer, foul odor, trace edema Neuro: A & O  X 3. Moves all extremities spontaneously. Dialysis Access: LUE AVF +bruit   Dialysis: AF MWF   3h 45 min 400/800   76kg   3K/2.25Ca  P 2  AVF No heparin  Mircera 50 q 2 weeks last 9/6  Hectorol 5, Sensipar 30    Assessment/Plan: 1. Left foot osteomyelitis .s/p L transmet amp 2017. Ortho following -will likely need BKA. IV antibiotics per primary.  2. ESRD -  HD MWF. HD today.  3. Rectal pain /Constipation - per primary. S/p disimpaction in ED 10/4.  4. Hypertension/volume  - BP elevated. Continue home meds. Below EDW by weights here. Challenge EDW w UF tomorrow.  5. Anemia  - Hgb 10.7 No ESA needs. Follow trends.  6. Metabolic bone disease -  Ca ok. Continue Hectorol Sensipar Velphoro binder  7. Nutrition - Renal diet/prot supp for low albumin  8. DMT2 - insulin per primary       Rob Erion Weightman 10/13/2019, 3:23 PM   Recent Labs  Lab 10/11/19 1130 10/11/19 1143 10/12/19 0712 10/13/19 0038  K  --  4.2 3.7  --   BUN  --  22 25*  --   CREATININE  --  3.66* 3.87*  --   CALCIUM  --  8.8* 8.2*  --   HGB   < >  --  10.7* 9.5*   < > = values in this interval not displayed.   Inpatient medications: . aspirin EC  81 mg Oral Daily  . atorvastatin  80 mg Oral Q2000  . carvedilol  25 mg Oral BID WC  . Chlorhexidine Gluconate  Cloth  6 each Topical Daily  . heparin  5,000 Units Subcutaneous Q8H  . hydrALAZINE  50 mg Oral TID  . insulin aspart  0-5 Units Subcutaneous QHS  . insulin aspart  0-9 Units Subcutaneous TID WC  . isosorbide mononitrate  60 mg Oral Daily  . ketorolac  30 mg Intravenous Once  . lactulose  10 g Oral TID  . linaclotide  145 mcg Oral QAC breakfast  . ranolazine  500 mg Oral BID  . sevelamer carbonate  800 mg Oral TID WC   . cefTRIAXone (ROCEPHIN)  IV 2 g (10/13/19 1225)  . metronidazole 500 mg (10/13/19 1400)  . vancomycin     acetaminophen, HYDROmorphone (DILAUDID) injection, polyethylene glycol

## 2019-10-13 NOTE — Progress Notes (Signed)
PROGRESS NOTE    Rebekah Johnson  OQH:[id number] DOB: 07-Nov-1960 DOA: 10/11/2019 PCP: Patient, No Pcp Per (Confirm with patient/family/NH records and if not entered, this HAS to be entered at [facility] point of entry. "No PCP" if truly none.)   Brief Narrative: (Start on day 1 of progress note - keep it brief and live) Patient is a 59 year old African-American female with history of hypertension, hyperlipidemia, ESRD on hemodialysis MWF, coronary artery disease s/p stent placement, chronic diastolic congestive heart failure, diabetes mellitus with neuropathy, peripheral arterial disease and transmetatarsal left foot amputation presented to ED complaining of severe left foot pain and abdominal pain secondary to constipation.  Patient is also complaining of rectal pain secondary to severe constipation.  Patient also had stent placement to her distal popliteal artery secondary to severe peripheral vascular disease.  Patient is on home opiates that resulted in severe constipation.  She was disimpacted under sedation and discharged home with bowel regimen.  In the ED CT scan of abdomen and pelvis showed proctitis and x-ray of left foot showed osteomyelitis.  Patient is started on broad-spectrum antibiotics and IV Dilaudid every 6 hours as needed by the admitting physician for 10 out of 10 foot pain.    Assessment & Plan:   Active Problems:   Osteomyelitis of foot, acute (HCC)   Non-pressure chronic ulcer of other part of left foot limited to breakdown of skin (HCC) Rectal pain secondary to proctitis Severe constipation ESRD on hemodialysis Hypertension Hyperlipidemia Diastolic heart failure Diabetes mellitus with neuropathy Peripheral arterial disease  Acute osteomyelitis of left foot, present on admission Patient presented with severe pain in her left foot and x-ray of left foot is positive for left foot osteomyelitis.  Continue IV antibiotics with IV vancomycin, Rocephin and Flagyl.  Pain control  according to the pain scale.  Orthopedic surgery consulted and they think that patient does not need amputation at this time and advised consult with vascular surgery.  Patient previously had transmetatarsal amputation in 2017 and recently again had that foot infection and completed course of doxycycline for 3 weeks as outpatient.  Vascular surgery consulted and patient was evaluated and they recommended ABI and left lower extremity arterial duplex ultrasound.  We will follow their recommendations.    Non-pressure chronic ulcer of other part of left foot limited to breakdown of skin  Continue IV antibiotic with IV ceftriaxone, IV vancomycin and IV metronidazole.  Rectal pain secondary to proctitis Patient was complaining of severe rectal pain and CT scan showed proctitis. Continue IV antibiotics and pain control. Blood cultures ordered.  Severe constipation Continue bowel regimen  ESRD on hemodialysis Nephrology consult ordered and patient will get dialysis as scheduled.  Hypertension Stable.  Continue home medications  Hyperlipidemia  Lipitor  Diastolic heart failure Patient had grade 1 diastolic dysfunction on most recent echo.  Patient is not in acute exacerbation of heart failure at this time. Continue home medications. Strict ins and outs and daily weight  Diabetes mellitus with neuropathy Continue sliding scale insulin Blood glucose monitoring and hypoglycemic protocol in place  Peripheral arterial disease Continue aspirin and statin.       DVT prophylaxis: Heparin Code Status: Full Family Communication: No family member at bedside Disposition Plan: Not determined yet   Consultants:   Orthopedic surgery  Vascular surgery  Procedures: (Don't include imaging studies which can be auto populated. Include things that cannot be auto populated i.e. Echo, Carotid and venous dopplers, Foley, Bipap, HD, tubes/drains, wound vac, central lines  etc)    Antimicrobials: (specify start and planned stop date. Auto populated tables are space occupying and do not give end dates)  Vancomycin, ceftriaxone, metronidazole   Subjective: Patient seen and evaluated at the bedside today.  Patient was complaining of severe pain in her foot and a dose of IV Toradol given.  Patient patient is also complaining of severe rectal pain but otherwise denies fever, chills, chest pain, shortness of breath, nausea, vomiting and abdominal pain.  Objective: Vitals:   10/13/19 0325 10/13/19 0752 10/13/19 1221 10/13/19 1649  BP: 134/61 (!) 154/56 (!) 135/54 (!) 164/59  Pulse: 67 73 65 74  Resp: 16 18 18 18   Temp: 98.7 F (37.1 C) 98.6 F (37 C) 98.6 F (37 C) 98.7 F (37.1 C)  TempSrc: Oral Oral Oral Oral  SpO2: 100% 99% 98% 97%  Weight:      Height:        Intake/Output Summary (Last 24 hours) at 10/13/2019 1734 Last data filed at 10/13/2019 1500 Gross per 24 hour  Intake 665 ml  Output 300 ml  Net 365 ml   Filed Weights   10/11/19 1032 10/11/19 1802  Weight: 72.6 kg 74 kg    Examination:  General exam: Appears calm and comfortable  Respiratory system: Clear to auscultation. Respiratory effort normal. Cardiovascular system: S1 & S2 heard, RRR. No JVD, murmurs, rubs, gallops or clicks. No pedal edema. Gastrointestinal system: Abdomen is nondistended, soft and nontender. No organomegaly or masses felt. Normal bowel sounds heard. Central nervous system: Alert and oriented. No focal neurological deficits. Extremities: Symmetric.  Left foot tarsal metatarsal amputation.  Left foot is tender to touch.  Ulceration dorsal surface of left foot.  No distal pulses in left foot Skin: Ulceration on left foot. Psychiatry: Judgement and insight appear normal. Mood & affect appropriate.     Data Reviewed: I have personally reviewed following labs and imaging studies  CBC: Recent Labs  Lab 10/09/19 0717 10/11/19 1130 10/12/19 0712  10/13/19 0038  WBC 14.8* 18.1* 12.1* 10.1  NEUTROABS 11.0*  --  8.5* 7.1  HGB 11.2* 12.3 10.7* 9.5*  HCT 36.4 39.7 35.8* 31.1*  MCV 90.8 89.8 92.3 91.2  PLT 380 454* 329 962   Basic Metabolic Panel: Recent Labs  Lab 10/09/19 0717 10/11/19 1143 10/12/19 0712  NA 134* 139 138  K 3.9 4.2 3.7  CL 97* 96* 100  CO2 22 27 26   GLUCOSE 103* 126* 97  BUN 51* 22 25*  CREATININE 6.53* 3.66* 3.87*  CALCIUM 8.8* 8.8* 8.2*   GFR: Estimated Creatinine Clearance: 15 mL/min (A) (by C-G formula based on SCr of 3.87 mg/dL (H)). Liver Function Tests: Recent Labs  Lab 10/11/19 1143 10/12/19 0712  AST 23 14*  ALT 13 8  ALKPHOS 112 93  BILITOT 0.4 0.2*  PROT 8.3* 6.6  ALBUMIN 2.9* 2.4*   Recent Labs  Lab 10/11/19 1143  LIPASE 50   No results for input(s): AMMONIA in the last 168 hours. Coagulation Profile: No results for input(s): INR, PROTIME in the last 168 hours. Cardiac Enzymes: No results for input(s): CKTOTAL, CKMB, CKMBINDEX, TROPONINI in the last 168 hours. BNP (last 3 results) No results for input(s): PROBNP in the last 8760 hours. HbA1C: Recent Labs    10/11/19 1130  HGBA1C 6.8*   CBG: Recent Labs  Lab 10/12/19 2209 10/13/19 0009 10/13/19 0624 10/13/19 1220 10/13/19 1644  GLUCAP 89 113* 86 104* 91   Lipid Profile: No results for input(s): CHOL, HDL,  LDLCALC, TRIG, CHOLHDL, LDLDIRECT in the last 72 hours. Thyroid Function Tests: No results for input(s): TSH, T4TOTAL, FREET4, T3FREE, THYROIDAB in the last 72 hours. Anemia Panel: No results for input(s): VITAMINB12, FOLATE, FERRITIN, TIBC, IRON, RETICCTPCT in the last 72 hours. Sepsis Labs: Recent Labs  Lab 10/11/19 1143 10/11/19 1550  LATICACIDVEN 4.0* 1.0    Recent Results (from the past 240 hour(s))  Blood culture (routine x 2)     Status: None (Preliminary result)   Collection Time: 10/11/19 12:30 PM   Specimen: BLOOD RIGHT ARM  Result Value Ref Range Status   Specimen Description BLOOD RIGHT  ARM  Final   Special Requests   Final    BOTTLES DRAWN AEROBIC AND ANAEROBIC Blood Culture results may not be optimal due to an inadequate volume of blood received in culture bottles   Culture   Final    NO GROWTH 2 DAYS Performed at [facility], [place] [address] [place], [place] [address]    Report Status PENDING  Incomplete  Blood culture (routine x 2)     Status: None (Preliminary result)   Collection Time: 10/11/19 12:45 PM   Specimen: BLOOD  Result Value Ref Range Status   Specimen Description BLOOD LEFT ANTECUBITAL  Final   Special Requests   Final    BOTTLES DRAWN AEROBIC AND ANAEROBIC Blood Culture adequate volume   Culture   Final    NO GROWTH 2 DAYS Performed at [facility], [place] [address] [place], [place] [address]    Report Status PENDING  Incomplete  Resp Panel by RT PCR (RSV, Flu A&B, Covid) - Nasopharyngeal Swab     Status: None   Collection Time: 10/11/19  4:00 PM   Specimen: Nasopharyngeal Swab  Result Value Ref Range Status   SARS Coronavirus 2 by RT PCR NEGATIVE NEGATIVE Final    Comment: (NOTE) SARS-CoV-2 target nucleic acids are NOT DETECTED.  The SARS-CoV-2 RNA is generally detectable in upper respiratoy specimens during the acute phase of infection. The lowest concentration of SARS-CoV-2 viral copies this assay can detect is 131 copies/mL. A negative result does not preclude SARS-Cov-2 infection and should not be used as the sole basis for treatment or other patient management decisions. A negative result may occur with  improper specimen collection/handling, submission of specimen other than nasopharyngeal swab, presence of viral mutation(s) within the areas targeted by this assay, and inadequate number of viral copies (<131 copies/mL). A negative result must be combined with clinical observations, patient history, and epidemiological information. The expected result is Negative.  Fact Sheet for Patients:   PinkCheek.be  Fact Sheet for Healthcare Providers:  GravelBags.it  This test is no t yet approved or cleared by the [place] FDA and  has been authorized for detection and/or diagnosis of SARS-CoV-2 by FDA under an Emergency Use Authorization (EUA). This EUA will remain  in effect (meaning this test can be used) for the duration of the COVID-19 declaration under Section 564(b)(1) of the Act, 21 U.S.C. section 360bbb-3(b)(1), unless the authorization is terminated or revoked sooner.     Influenza A by PCR NEGATIVE NEGATIVE Final   Influenza B by PCR NEGATIVE NEGATIVE Final    Comment: (NOTE) The Xpert Xpress SARS-CoV-2/FLU/RSV assay is intended as an aid in  the diagnosis of influenza from Nasopharyngeal swab specimens and  should not be used as a sole basis for treatment. Nasal washings and  aspirates are unacceptable for Xpert Xpress SARS-CoV-2/FLU/RSV  testing.  Fact Sheet for Patients: PinkCheek.be  Fact Sheet for Healthcare Providers: GravelBags.it  This test is not yet approved or cleared by the [place] FDA and  has been authorized for detection and/or diagnosis of SARS-CoV-2 by  FDA under an Emergency Use Authorization (EUA). This EUA will remain  in effect (meaning this test can be used) for the duration of the  Covid-19 declaration under Section 564(b)(1) of the Act, 21  U.S.C. section 360bbb-3(b)(1), unless the authorization is  terminated or revoked.    Respiratory Syncytial Virus by PCR NEGATIVE NEGATIVE Final    Comment: (NOTE) Fact Sheet for Patients: PinkCheek.be  Fact Sheet for Healthcare Providers: GravelBags.it  This test is not yet approved or cleared by the [place] FDA and  has been authorized for detection and/or diagnosis of SARS-CoV-2 by  FDA under an Emergency  Use Authorization (EUA). This EUA will remain  in effect (meaning this test can be used) for the duration of the  COVID-19 declaration under Section 564(b)(1) of the Act, 21 U.S.C.  section 360bbb-3(b)(1), unless the authorization is terminated or  revoked. Performed at [facility], [place] [address], [place], [place] [address]          Radiology Studies: No results found.      Scheduled Meds: . aspirin EC  81 mg Oral Daily  . atorvastatin  80 mg Oral Q2000  . carvedilol  25 mg Oral BID WC  . Chlorhexidine Gluconate Cloth  6 each Topical Daily  . heparin  5,000 Units Subcutaneous Q8H  . hydrALAZINE  50 mg Oral TID  . insulin aspart  0-5 Units Subcutaneous QHS  . insulin aspart  0-9 Units Subcutaneous TID WC  . isosorbide mononitrate  60 mg Oral Daily  . ketorolac  30 mg Intravenous Once  . lactulose  10 g Oral TID  . linaclotide  145 mcg Oral QAC breakfast  . ranolazine  500 mg Oral BID  . sevelamer carbonate  800 mg Oral TID WC   Continuous Infusions: . cefTRIAXone (ROCEPHIN)  IV 2 g (10/13/19 1225)  . metronidazole 500 mg (10/13/19 1400)  . vancomycin       LOS: 2 days    Time spent:     Edmonia Lynch, MD [facility] Pager [phone]  If 7PM-7AM, please contact night-coverage [email or web address] Password [facility] 10/13/2019, 5:34 PM

## 2019-10-13 NOTE — Consult Note (Signed)
Hospital Consult    Reason for Consult: Worsening left foot ulceration Referring Physician: Dr. Sharol Given MRN #:  [id number]  History of Present Illness: This is a 59 y.o. female history of end-stage renal disease and recent left SFA and popliteal artery drug-coated balloon angioplasty with stent of the distal popliteal artery.  She is now admitted with worsening ulcer on the left foot.  She states that she is also had difficulty with her bowels with ongoing constipation.  Past Medical History:  Diagnosis Date  . Acute congestive heart failure ([place])   . Acute diastolic CHF (congestive heart failure) ([place]) 09/14/2016  . Atrophic vaginitis 06/07/2012  . Avitaminosis D 07/28/2012  . CAD (coronary artery disease)    a. NSTEM 09/2016: cath showing severe diffuse disease of the RCA, ramus and Cx with mild-mod disease of LAD; PCI would require multiple stents and significant contrast usage thus medical therapy recommended.  . Cellulitis 07/12/2015  . Chest pain 01/01/2017  . Chronic diastolic CHF (congestive heart failure) ([place])   . Chronic kidney disease 09/23/2016  . CKD (chronic kidney disease), stage IV ([place])   . Constipation   . COPD GOLD  0 08/31/2017   Quit smoking 07/17/17 - Spirometry 09/01/2017  FEV1 1.23 (58%)  Ratio 57 with mild curvature p no rx - 09/01/2017  After extensive coaching inhaler device,  effectiveness =    75% with elipta with cough provoked  - PFT's  10/14/2017  FEV1 1.69 (68 % ) ratio 84  p no % improvement from saba p nothing prior to study with DLCO  50 % corrects to 76  % for alv volume   - 10/14/2017  Walked RA x 3 laps @ 1  . Current tobacco use 06/10/2012  . Diabetes mellitus with neuropathy ([place])   . Diabetic foot ulcer ([place]) 07/13/2015  . Diabetic polyneuropathy associated with type 2 diabetes mellitus ([place]) 08/19/2017  . DOE (dyspnea on exertion) 09/01/2017   09/01/2017  Walked RA x 3 laps @ 185 ft each stopped due to  End of study, nl to mod fast  pace, no  desat   But  stopped to rest x 2  Spirometry 09/01/2017  FEV1 1.23 (58%)  Ratio 57  > trial of anoro    . Dyspnea    with exertion  . ESRD (end stage renal disease) ([place])    MWF - [facility]  . Essential (primary) hypertension 06/10/2012  . Foot amputation status    a. h/o left foot transmetatarsal amputation  . Headache    Migraines  . High cholesterol   . History of biliary T-tube placement 06/10/2012   Patient unaware   . History of cardiac arrest   . HLD (hyperlipidemia) 09/02/2012   Overview:  ICD-10 cut over    . Hypertension    a. patent renal arteries by PV angio 08/2015. b. heavy proteinuria 09/2016 ? nephrotic.  [name] Hypertensive heart disease with heart failure ([place])   . Hypertriglyceridemia 07/28/2012  . Leg pain 06/10/2012  . Normocytic anemia    pt denies this dx  . NSTEMI (non-ST elevated myocardial infarction) ([place]) 01/01/2017  . Obesity   . Onychomycosis 12/24/2015  . Peripheral nerve disease 09/02/2012   legs  . Peripheral vascular disease ([place]) 07/28/2012  . Pneumonia 2018  . Proteinuria   . PVD (peripheral vascular disease) ([place])    a. status post left SFA and popliteal stent and left SFA PTA with drug coated balloon in 08/2015.  . Status post  transmetatarsal amputation of foot, left ([place]) 09/06/2015  . Tobacco abuse    quit cigarettes 2019, smokes marijuana  . Type 2 diabetes mellitus ([place]) 06/07/2012  . Type 2 diabetes mellitus with peripheral angiopathy ([place]) 09/02/2012    Past Surgical History:  Procedure Laterality Date  . ABDOMINAL AORTOGRAM W/LOWER EXTREMITY Left 09/25/2019   Procedure: ABDOMINAL AORTOGRAM W/LOWER EXTREMITY;  Surgeon: Waynetta Sandy, MD;  Location: [facility];  Service: Cardiovascular;  Laterality: Left;  . ABDOMINAL HYSTERECTOMY    . AMPUTATION Left 09/06/2015   Procedure: LEFT TRANSMETATARSAL AMPUTATTION;  Surgeon: Newt Minion, MD;  Location: [facility];  Service: Orthopedics;  Laterality: Left;  . AV FISTULA PLACEMENT Left 12/13/2018    Procedure: ARTERIOVENOUS (AV) FISTULA CREATION LEFT UPPER ARM;  Surgeon: Waynetta Sandy, MD;  Location: [facility];  Service: Vascular;  Laterality: Left;  . BASCILIC VEIN TRANSPOSITION Left 03/30/2019   Procedure: LEFT ARM ARTERIOVENOUS VEIN TRANSPOSITION;  Surgeon: Serafina Mitchell, MD;  Location: [facility];  Service: Vascular;  Laterality: Left;  . COLONOSCOPY     polyps x 1  . LEFT HEART CATH AND CORONARY ANGIOGRAPHY N/A 09/17/2016   Procedure: LEFT HEART CATH AND CORONARY ANGIOGRAPHY;  Surgeon: Jettie Booze, MD;  Location: [facility];  Service: Cardiovascular;  Laterality: N/A;  . PERIPHERAL VASCULAR ATHERECTOMY Left 09/25/2019   Procedure: PERIPHERAL VASCULAR ATHERECTOMY;  Surgeon: Waynetta Sandy, MD;  Location: [facility];  Service: Cardiovascular;  Laterality: Left;  SFA  . PERIPHERAL VASCULAR CATHETERIZATION N/A 08/16/2015   Procedure: Abdominal Aortogram;  Surgeon: Elam Dutch, MD;  Location: [facility];  Service: Cardiovascular;  Laterality: N/A;  . PERIPHERAL VASCULAR CATHETERIZATION Bilateral 08/16/2015   Procedure: Lower Extremity Angiography;  Surgeon: Elam Dutch, MD;  Location: [facility];  Service: Cardiovascular;  Laterality: Bilateral;  . PERIPHERAL VASCULAR CATHETERIZATION Left 08/16/2015   Procedure: Peripheral Vascular Intervention;  Surgeon: Elam Dutch, MD;  Location: [facility];  Service: Cardiovascular;  Laterality: Left;  SFA STENT X 2  . PERIPHERAL VASCULAR INTERVENTION Left 09/25/2019   Procedure: PERIPHERAL VASCULAR INTERVENTION;  Surgeon: Waynetta Sandy, MD;  Location: [facility];  Service: Cardiovascular;  Laterality: Left;  SFA    No Known Allergies  Prior to Admission medications   Medication Sig Start Date End Date Taking? Authorizing Provider  acetaminophen (TYLENOL) 500 MG tablet Take 1,000 mg by mouth every 6 (six) hours as needed for moderate pain or headache.   Yes  [provider]  amLODipine (NORVASC) 5 MG tablet Take 1 tablet (5 mg total) by mouth daily. 08/22/19  Yes Dorothy Spark, MD  Ascorbic Acid (VITAMIN C) 1000 MG tablet Take 1,000 mg by mouth daily.   Yes [provider]  aspirin 81 MG EC tablet Take 1 tablet (81 mg total) by mouth daily. Reported on 07/13/2015 07/02/16  Yes Charlott Rakes, MD  atorvastatin (LIPITOR) 80 MG tablet Take 1 tablet (80 mg total) by mouth daily. 10/07/17  Yes Scot Jun, FNP  Blood Glucose Monitoring Suppl (ACCU-CHEK AVIVA) device Use as instructed three times daily. Patient taking differently: Use as instructed twice times daily. 04/22/16  Yes Charlott Rakes, MD  carvedilol (COREG) 25 MG tablet Take 1 tablet (25 mg total) by mouth 2 (two) times daily. 10/07/17  Yes Scot Jun, FNP  cholecalciferol (VITAMIN D3) 25 MCG (1000 UT) tablet Take 1,000 Units by mouth daily.   Yes  [provider]  clopidogrel (PLAVIX) 75 MG tablet Take 1 tablet (75 mg total) by mouth daily. 10/07/17  Yes Scot Jun, FNP  docusate sodium (COLACE) 100 MG capsule Take 200 mg by mouth 2 (two) times daily.    Yes [provider]  doxycycline (VIBRA-TABS) 100 MG tablet Take 1 tablet (100 mg total) by mouth daily. 09/28/19  Yes Waynetta Sandy, MD  furosemide (LASIX) 80 MG tablet Take 80 mg by mouth 2 (two) times daily. 03/21/18  Yes [provider]  gabapentin (NEURONTIN) 300 MG capsule Take 1 capsule (300 mg total) by mouth 3 (three) times daily. 10/07/17  Yes Scot Jun, FNP  glipiZIDE (GLUCOTROL) 5 MG tablet Take 0.5 tablets (2.5 mg total) by mouth daily before breakfast. Patient taking differently: Take 5 mg by mouth daily before breakfast.  11/01/18  Yes Renato Shin, MD  glucose blood (ACCU-CHEK AVIVA) test strip Use as instructed three times daily before meals. 10/14/17  Yes Charlott Rakes, MD  hydrALAZINE (APRESOLINE) 50 MG tablet Take 50 mg by mouth 3 (three)  times daily.   Yes [provider]  HYDROcodone-acetaminophen (NORCO/VICODIN) 5-325 MG tablet Take 1 tablet by mouth every 6 (six) hours as needed for moderate pain. 08/08/19  Yes Persons, Bevely [place], PA  isosorbide mononitrate (IMDUR) 30 MG 24 hr tablet Take 60 mg by mouth daily. 03/22/18  Yes [provider]  lactulose (CHRONULAC) 10 GM/15ML solution Take 15 mLs (10 g total) by mouth 3 (three) times daily. 03/21/19  Yes Lawyer, Harrell Gave, PA-C  Lancet Devices (ACCU-CHEK SOFTCLIX) lancets Use as instructed three times daily before meals. 04/22/16  Yes Newlin, Enobong, MD  latanoprost (XALATAN) 0.005 % ophthalmic solution Place 1 drop into both eyes at bedtime.    Yes [provider]  linaclotide Rolan Lipa) 145 MCG CAPS capsule Take 1 capsule (145 mcg total) by mouth daily before breakfast. Take ~ 30 minutes before breakfast, on an empty stomach 10/09/19  Yes Sponseller, Rebekah R, PA-C  nitroGLYCERIN (NITRODUR - DOSED IN MG/24 HR) 0.2 mg/hr patch Place 1 patch (0.2 mg total) onto the skin daily. 08/15/19  Yes Newt Minion, MD  nitroGLYCERIN (NITROSTAT) 0.4 MG SL tablet Place 1 tablet (0.4 mg total) under the tongue every 5 (five) minutes as needed for chest pain. 01/18/17 10/12/19 Yes Dorothy Spark, MD  Omega-3 Fatty Acids (FISH OIL) 1000 MG CAPS Take 1,000 mg by mouth daily.    Yes [provider]  oxyCODONE-acetaminophen (PERCOCET/ROXICET) 5-325 MG tablet Take 1 tablet by mouth every 4 (four) hours as needed for severe pain. 09/15/19  Yes Waynetta Sandy, MD  ranolazine (RANEXA) 500 MG 12 hr tablet Take 1 tablet by mouth twice daily 07/04/19  Yes Dorothy Spark, MD  sevelamer carbonate (RENVELA) 800 MG tablet Take 800 mg by mouth See admin instructions. Three times daily with meals and with snack.   Yes [provider]  silver sulfADIAZINE (SILVADENE) 1 % cream Apply 1 application topically daily. Apply to affected area daily plus dry  dressing Patient taking differently: Apply 1 application topically daily as needed (wound care).  07/11/18  Yes Newt Minion, MD  sitaGLIPtin (JANUVIA) 25 MG tablet Take 1 tablet (25 mg total) by mouth daily. 10/07/17  Yes Scot Jun, FNP  ZOFRAN 4 MG tablet Take 4 mg by mouth every 8 (eight) hours as needed for nausea/vomiting. 03/08/19  Yes [provider]    Social History   Socioeconomic History  .  Marital status: Legally Separated    Spouse name: Not on file  . Number of children: Not on file  . Years of education: Not on file  . Highest education level: Not on file  Occupational History  . Not on file  Tobacco Use  . Smoking status: Former Smoker    Packs/day: 1.00    Years: 42.00    Pack years: 42.00    Types: Cigarettes    Quit date: 07/17/2017    Years since quitting: 2.2  . Smokeless tobacco: Never Used  Vaping Use  . Vaping Use: Never used  Substance and Sexual Activity  . Alcohol use: Not Currently    Alcohol/week: 1.0 - 2.0 standard drink    Types: 1 - 2 Glasses of wine per week    Comment: on social occassions  . Drug use: Yes    Frequency: 7.0 times per week    Types: Marijuana    Comment: daily use - last use 03/28/19  . Sexual activity: Not on file    Comment: Hysterectomy  Other Topics Concern  . Not on file  Social History Narrative  . Not on file   Social Determinants of Health   Financial Resource Strain:   . Difficulty of Paying Living Expenses: Not on file  Food Insecurity:   . Worried About Charity fundraiser in the Last Year: Not on file  . Ran Out of Food in the Last Year: Not on file  Transportation Needs:   . Lack of Transportation (Medical): Not on file  . Lack of Transportation (Non-Medical): Not on file  Physical Activity:   . Days of Exercise per Week: Not on file  . Minutes of Exercise per Session: Not on file  Stress:   . Feeling of Stress : Not on file  Social Connections:   . Frequency of Communication with  Friends and Family: Not on file  . Frequency of Social Gatherings with Friends and Family: Not on file  . Attends Religious Services: Not on file  . Active Member of Clubs or Organizations: Not on file  . Attends Archivist Meetings: Not on file  . Marital Status: Not on file  Intimate Partner Violence:   . Fear of Current or Ex-Partner: Not on file  . Emotionally Abused: Not on file  . Physically Abused: Not on file  . Sexually Abused: Not on file    Family History  Problem Relation Age of Onset  . Diabetes Mother   . Hypertension Father   . Colon cancer Neg Hx   . Stomach cancer Neg Hx   . Pancreatic cancer Neg Hx     ROS:  Cardiovascular: []  chest pain/pressure []  palpitations []  SOB lying flat []  DOE [x]  pain in left foot []  pain in legs at rest []  pain in legs at night []  non-healing ulcers []  hx of DVT []  swelling in legs  Pulmonary: []  productive cough []  asthma/wheezing []  home O2  Neurologic: []  weakness in []  arms []  legs []  numbness in []  arms []  legs []  hx of CVA []  mini stroke [] difficulty speaking or slurred speech []  temporary loss of vision in one eye []  dizziness  Hematologic: []  hx of cancer []  bleeding problems []  problems with blood clotting easily  Endocrine:   []  diabetes []  thyroid disease  GI []  vomiting blood []  blood in stool  GU: []  CKD/renal failure []  HD--[]  M/W/F or []  T/T/S []  burning with urination []  blood in urine  Psychiatric: []  anxiety []  depression  Musculoskeletal: []  arthritis []  joint pain  Integumentary: []  rashes []  ulcers  Constitutional: []  fever []  chills   Physical Examination  Vitals:   10/13/19 1221 10/13/19 1649  BP: (!) 135/54 (!) 164/59  Pulse: 65 74  Resp: 18 18  Temp: 98.6 F (37 C) 98.7 F (37.1 C)  SpO2: 98% 97%   Body mass index is 28 kg/m.    aaox3 Non labored respirations Non palpable pedal pulses  Left foot xray IMPRESSION: 1. Subtle cortical  erosion of the proximal aspect of the fifth metatarsal base highly suspicious for acute osteomyelitis. 2. Soft tissue swelling and air within the adjacent lateral and plantar soft tissues suggesting ulceration. Soft tissue infection with a gas-forming organism is not excluded.   CBC    Component Value Date/Time   WBC 10.1 10/13/2019 0038   RBC 3.41 (L) 10/13/2019 0038   HGB 9.5 (L) 10/13/2019 0038   HGB 12.9 04/27/2019 0951   HGB 11.6 01/14/2017 0958   HCT 31.1 (L) 10/13/2019 0038   HCT 35.9 01/14/2017 0958   PLT 306 10/13/2019 0038   PLT 334 04/27/2019 0951   PLT 308 01/14/2017 0958   MCV 91.2 10/13/2019 0038   MCV 81 01/14/2017 0958   MCH 27.9 10/13/2019 0038   MCHC 30.5 10/13/2019 0038   RDW 16.3 (H) 10/13/2019 0038   RDW 14.9 01/14/2017 0958   LYMPHSABS 2.0 10/13/2019 0038   LYMPHSABS 2.6 01/14/2017 0958   MONOABS 0.9 10/13/2019 0038   EOSABS 0.1 10/13/2019 0038   EOSABS 0.2 01/14/2017 0958   BASOSABS 0.1 10/13/2019 0038   BASOSABS 0.1 01/14/2017 0958    BMET    Component Value Date/Time   NA 138 10/12/2019 0712   NA 141 10/07/2017 1234   K 3.7 10/12/2019 0712   CL 100 10/12/2019 0712   CO2 26 10/12/2019 0712   GLUCOSE 97 10/12/2019 0712   BUN 25 (H) 10/12/2019 0712   BUN 33 (H) 10/07/2017 1234   CREATININE 3.87 (H) 10/12/2019 0712   CREATININE 4.15 (HH) 04/27/2019 0951   CREATININE 1.19 (H) 12/18/2015 1006   CALCIUM 8.2 (L) 10/12/2019 0712   GFRNONAA 12 (L) 10/12/2019 0712   GFRNONAA 11 (L) 04/27/2019 0951   GFRNONAA 51 (L) 12/18/2015 1006   GFRAA 7 (L) 10/09/2019 0717   GFRAA 13 (L) 04/27/2019 0951   GFRAA 59 (L) 12/18/2015 1006    COAGS: Lab Results  Component Value Date   INR 0.98 03/02/2017   INR 1.00 09/17/2016     Non-Invasive Vascular Imaging:   ABI and Left lower extremity arterial duplex ordered   ASSESSMENT/PLAN: This is a 59 y.o. female history of left lower extremity revascularization.  She has a previous transmetatarsal  amputation with now ulceration does not have further surgical options for revision.  Does appear to have underlying osteomyelitis.  We will get ABIs and duplex I think given the severity of her ulceration she is likely facing a below-knee amputation.  I will follow up with her after her studies are completed.  Danika Kluender C. Donzetta Matters, MD [facility] Office: [phone] Pager: [phone]

## 2019-10-14 ENCOUNTER — Inpatient Hospital Stay (HOSPITAL_COMMUNITY): Payer: Medicare Other

## 2019-10-14 DIAGNOSIS — E119 Type 2 diabetes mellitus without complications: Secondary | ICD-10-CM

## 2019-10-14 DIAGNOSIS — N186 End stage renal disease: Secondary | ICD-10-CM | POA: Diagnosis not present

## 2019-10-14 DIAGNOSIS — Z794 Long term (current) use of insulin: Secondary | ICD-10-CM | POA: Diagnosis not present

## 2019-10-14 DIAGNOSIS — I739 Peripheral vascular disease, unspecified: Secondary | ICD-10-CM

## 2019-10-14 DIAGNOSIS — M86179 Other acute osteomyelitis, unspecified ankle and foot: Secondary | ICD-10-CM | POA: Diagnosis not present

## 2019-10-14 DIAGNOSIS — I96 Gangrene, not elsewhere classified: Secondary | ICD-10-CM | POA: Diagnosis not present

## 2019-10-14 LAB — CBC
HCT: 33.6 % — ABNORMAL LOW (ref 36.0–46.0)
Hemoglobin: 10.4 g/dL — ABNORMAL LOW (ref 12.0–15.0)
MCH: 27.8 pg (ref 26.0–34.0)
MCHC: 31 g/dL (ref 30.0–36.0)
MCV: 89.8 fL (ref 80.0–100.0)
Platelets: 276 10*3/uL (ref 150–400)
RBC: 3.74 MIL/uL — ABNORMAL LOW (ref 3.87–5.11)
RDW: 15.9 % — ABNORMAL HIGH (ref 11.5–15.5)
WBC: 10 10*3/uL (ref 4.0–10.5)
nRBC: 0 % (ref 0.0–0.2)

## 2019-10-14 LAB — GLUCOSE, CAPILLARY
Glucose-Capillary: 63 mg/dL — ABNORMAL LOW (ref 70–99)
Glucose-Capillary: 80 mg/dL (ref 70–99)
Glucose-Capillary: 93 mg/dL (ref 70–99)
Glucose-Capillary: 121 mg/dL — ABNORMAL HIGH (ref 70–99)
Glucose-Capillary: 112 mg/dL — ABNORMAL HIGH (ref 70–99)

## 2019-10-14 LAB — CBC WITH DIFFERENTIAL/PLATELET
Abs Immature Granulocytes: 0.05 10*3/uL (ref 0.00–0.07)
Basophils Absolute: 0 10*3/uL (ref 0.0–0.1)
Basophils Relative: 0 %
Eosinophils Absolute: 0 10*3/uL (ref 0.0–0.5)
Eosinophils Relative: 0 %
HCT: 33.4 % — ABNORMAL LOW (ref 36.0–46.0)
Hemoglobin: 10.3 g/dL — ABNORMAL LOW (ref 12.0–15.0)
Immature Granulocytes: 1 %
Lymphocytes Relative: 19 %
Lymphs Abs: 1.9 10*3/uL (ref 0.7–4.0)
MCH: 28.6 pg (ref 26.0–34.0)
MCHC: 30.8 g/dL (ref 30.0–36.0)
MCV: 92.8 fL (ref 80.0–100.0)
Monocytes Absolute: 0.8 10*3/uL (ref 0.1–1.0)
Monocytes Relative: 8 %
Neutro Abs: 7.2 10*3/uL (ref 1.7–7.7)
Neutrophils Relative %: 72 %
Platelets: 305 10*3/uL (ref 150–400)
RBC: 3.6 MIL/uL — ABNORMAL LOW (ref 3.87–5.11)
RDW: 16.1 % — ABNORMAL HIGH (ref 11.5–15.5)
WBC: 10.1 10*3/uL (ref 4.0–10.5)
nRBC: 0 % (ref 0.0–0.2)

## 2019-10-14 LAB — BASIC METABOLIC PANEL
Anion gap: 12 (ref 5–15)
BUN: 28 mg/dL — ABNORMAL HIGH (ref 8–23)
CO2: 24 mmol/L (ref 22–32)
Calcium: 8.2 mg/dL — ABNORMAL LOW (ref 8.9–10.3)
Chloride: 102 mmol/L (ref 98–111)
Creatinine, Ser: 4.54 mg/dL — ABNORMAL HIGH (ref 0.44–1.00)
GFR, Estimated: 10 mL/min — ABNORMAL LOW (ref >60)
Glucose, Bld: 78 mg/dL (ref 70–99)
Potassium: 3.6 mmol/L (ref 3.5–5.1)
Sodium: 138 mmol/L (ref 135–145)

## 2019-10-14 LAB — RENAL FUNCTION PANEL
Albumin: 2.3 g/dL — ABNORMAL LOW (ref 3.5–5.0)
Anion gap: 11 (ref 5–15)
BUN: 7 mg/dL — ABNORMAL LOW (ref 8–23)
CO2: 32 mmol/L (ref 22–32)
Calcium: 8.2 mg/dL — ABNORMAL LOW (ref 8.9–10.3)
Chloride: 96 mmol/L — ABNORMAL LOW (ref 98–111)
Creatinine, Ser: 2.38 mg/dL — ABNORMAL HIGH (ref 0.44–1.00)
GFR, Estimated: 21 mL/min — ABNORMAL LOW (ref >60)
Glucose, Bld: 93 mg/dL (ref 70–99)
Phosphorus: 2.1 mg/dL — ABNORMAL LOW (ref 2.5–4.6)
Potassium: 3.1 mmol/L — ABNORMAL LOW (ref 3.5–5.1)
Sodium: 139 mmol/L (ref 135–145)

## 2019-10-14 MED ORDER — SODIUM CHLORIDE 0.9 % IV SOLN: 100.0000 mL | INTRAVENOUS | Status: DC | PRN

## 2019-10-14 MED ORDER — HEPARIN SODIUM (PORCINE) 1000 UNIT/ML DIALYSIS: 1000.0000 [IU] | INTRAMUSCULAR | Status: DC | PRN

## 2019-10-14 MED ORDER — LIDOCAINE HCL (PF) 1 % IJ SOLN: 5.0000 mL | INTRAMUSCULAR | Status: DC | PRN

## 2019-10-14 MED ORDER — VANCOMYCIN HCL IN DEXTROSE 750-5 MG/150ML-% IV SOLN
750.0000 mg | INTRAVENOUS | Status: AC
  Administered 2019-10-14: 750 mg via INTRAVENOUS

## 2019-10-14 MED ORDER — LIDOCAINE-PRILOCAINE 2.5-2.5 % EX CREA: 1.0000 "application " | TOPICAL_CREAM | CUTANEOUS | Status: DC | PRN

## 2019-10-14 MED ORDER — HYDROMORPHONE HCL 1 MG/ML IJ SOLN
INTRAMUSCULAR | Status: AC
  Filled 2019-10-14: qty 1

## 2019-10-14 MED ORDER — PENTAFLUOROPROP-TETRAFLUOROETH EX AERO: 1.0000 "application " | INHALATION_SPRAY | CUTANEOUS | Status: DC | PRN

## 2019-10-14 MED ORDER — VANCOMYCIN HCL IN DEXTROSE 750-5 MG/150ML-% IV SOLN
INTRAVENOUS | Status: AC
  Filled 2019-10-14: qty 150

## 2019-10-14 MED ORDER — ACETAMINOPHEN 325 MG PO TABS
ORAL_TABLET | ORAL | Status: AC
  Filled 2019-10-14: qty 2

## 2019-10-14 MED ORDER — ALTEPLASE 2 MG IJ SOLR: 2.0000 mg | Freq: Once | INTRAMUSCULAR | Status: DC | PRN

## 2019-10-14 NOTE — Progress Notes (Signed)
Left lower extremity arterial duplex completed. Refer to "CV Proc" under chart review to view preliminary results.  10/14/2019 3:20 PM Kelby Aline., MHA, RVT, RDCS, RDMS

## 2019-10-14 NOTE — Progress Notes (Signed)
Pharmacy Antibiotic Note  Rebekah Johnson is a 59 y.o. female admitted on 10/11/2019 with Osteo.  Pharmacy has been consulted for Vancomycin dosing.  ID: left foot osteomyelitis + ulcerAfebrile. WBC 10.1. Possibly  needs BKA  Vanc 10/6>> CTX 10/7>> Flagyl 10/6>>  10/6 BCx: x2: ngtd 10/6 Covid: neg,  influenza A/B PCR: neg  Plan: Vanco off-schedule 10/9 (Sat) then 750mg  MWF- f/u charting. Spoke with HD F/u to resume home meds Plavix PTA (hold for possible surgery)    Height: 5\' 4"  (162.6 cm) Weight: 72 kg (158 lb 11.7 oz) IBW/kg (Calculated) : 54.7  Temp (24hrs), Avg:98.6 F (37 C), Min:98.1 F (36.7 C), Max:98.9 F (37.2 C)  Recent Labs  Lab 10/09/19 0717 10/11/19 1130 10/11/19 1143 10/11/19 1550 10/12/19 0712 10/13/19 0038 10/14/19 0601  WBC 14.8* 18.1*  --   --  12.1* 10.1 10.1  CREATININE 6.53*  --  3.66*  --  3.87*  --  4.54*  LATICACIDVEN  --   --  4.0* 1.0  --   --   --     Estimated Creatinine Clearance: 12.7 mL/min (A) (by C-G formula based on SCr of 4.54 mg/dL (H)).    No Known Allergies  Rebekah Johnson S. [name], PharmD, BCPS Clinical Staff Pharmacist [email or web address] [place] 10/14/2019 9:20 AM

## 2019-10-14 NOTE — Progress Notes (Signed)
PROGRESS NOTE    Rebekah Johnson  ZSW:[id number] DOB: Feb 26, 1960 DOA: 10/11/2019 PCP: Patient, No Pcp Per    Chief Complaint  Patient presents with  . Fecal Impaction    Brief Narrative:  Patient is a 59 year old African-American female with history of hypertension, hyperlipidemia, ESRD on hemodialysis MWF, coronary artery disease s/p stent placement, chronic diastolic congestive heart failure, diabetes mellitus with neuropathy, peripheral arterial disease and transmetatarsal left foot amputation presented to ED complaining of severe left foot pain and abdominal pain secondary to constipation. Patient is also complaining of rectal pain secondary to severe constipation. Patient also had stent placement to her distal popliteal artery secondary to severe peripheral vascular disease. Patient is on home opiates that resulted in severe constipation. She was disimpacted under sedation and discharged home with bowel regimen. In the ED CT scan of abdomen and pelvis showed proctitis and x-ray of left foot showed osteomyelitis.  Patient is started on broad-spectrum antibiotics and IV Dilaudid every 6 hours as needed by the admitting physician for 10 out of 10 foot pain.  Subjective:  Returned from dialysis, currently denies pain  Assessment & Plan:   Active Problems:   Osteomyelitis of foot, acute (HCC)   Non-pressure chronic ulcer of other part of left foot limited to breakdown of skin (HCC)   Pyogenic inflammation of bone (HCC)  Acute osteomyelitis of left foot, present on admission Patient presented with severe pain in her left foot and x-ray of left foot is positive for left foot osteomyelitis. Continue IV antibiotics with IV vancomycin, Rocephin and Flagyl. Pain control according to the pain scale. Orthopedic surgery consulted and they think that patient does not need amputation at this time and advised consult with vascular surgery. Patient previously had transmetatarsal amputation in  2017 and recently again had that foot infection and completed course of doxycycline for 3 weeks as outpatient.  Vascular surgery consulted and patient was evaluated and they recommended ABI and left lower extremity arterial duplex ultrasound.  We will follow their recommendations.  Non-pressure chronic ulcer of other part of left foot limited to breakdown of skin  Continue IV antibiotic with IV ceftriaxone, IV vancomycin and IV metronidazole.  Rectal pain secondary to proctitis Patient was complaining of severe rectal pain and CT scan showed proctitis. Continue IV antibiotics and pain control. Blood cultures ordered.  Severe constipation Continue bowel regimen  ESRD on hemodialysis Nephrology consult ordered and patient will get dialysis as scheduled.  Hypertension Stable. Continue home medications  Hyperlipidemia Lipitor  Diastolic heart failure Patient had grade 1 diastolic dysfunction on most recent echo. Patient is not in acute exacerbation of heart failure at this time. Continue home medications. Strict ins and outs and daily weight  Diabetes mellitus with neuropathy Continue sliding scale insulin Blood glucose monitoring and hypoglycemic protocol in place  Peripheral arterial disease Continue aspirin and statin.   DVT prophylaxis: heparin injection 5,000 Units Start: 10/11/19 2200 SCDs Start: 10/11/19 1702   Code Status:full  Family Communication: patient Disposition:   Status is: Inpatient   Dispo:  Patient From: Home  Planned Disposition: Home with Health Care Svc  Expected discharge date: TBD  Medically stable for discharge: No   Consultants:   Nephrology  Vascular surgery  Procedures:   Dialysis  Antimicrobials:   Rocephin and Flagyl     Objective: Vitals:   10/14/19 0925 10/14/19 0935 10/14/19 0940 10/14/19 0955  BP:      Pulse:      Resp: (!) 23 (!)  21 14 16   Temp:      TempSrc:      SpO2:      Weight:       Height:        Intake/Output Summary (Last 24 hours) at 10/14/2019 1030 Last data filed at 10/14/2019 0600 Gross per 24 hour  Intake 570 ml  Output --  Net 570 ml   Filed Weights   10/11/19 1032 10/11/19 1802 10/14/19 0737  Weight: 72.6 kg 74 kg 72 kg    Examination:  General exam: drowsy, weak, NAD Respiratory system: Clear to auscultation. Respiratory effort normal. Cardiovascular system: S1 & S2 heard, RRR. No JVD, no murmur, No pedal edema. Gastrointestinal system: Abdomen is nondistended, soft and nontender. Normal bowel sounds heard. Central nervous system: Alert and oriented. No focal neurological deficits. Extremities: Generalized weakness, left lower extremity TMA with draining ulcer +odor, LUE AVF  Psychiatry: flat affect.     Data Reviewed: I have personally reviewed following labs and imaging studies  CBC: Recent Labs  Lab 10/09/19 0717 10/11/19 1130 10/12/19 0712 10/13/19 0038 10/14/19 0601  WBC 14.8* 18.1* 12.1* 10.1 10.1  NEUTROABS 11.0*  --  8.5* 7.1 7.2  HGB 11.2* 12.3 10.7* 9.5* 10.3*  HCT 36.4 39.7 35.8* 31.1* 33.4*  MCV 90.8 89.8 92.3 91.2 92.8  PLT 380 454* 329 306 846    Basic Metabolic Panel: Recent Labs  Lab 10/09/19 0717 10/11/19 1143 10/12/19 0712 10/14/19 0601  NA 134* 139 138 138  K 3.9 4.2 3.7 3.6  CL 97* 96* 100 102  CO2 22 27 26 24   GLUCOSE 103* 126* 97 78  BUN 51* 22 25* 28*  CREATININE 6.53* 3.66* 3.87* 4.54*  CALCIUM 8.8* 8.8* 8.2* 8.2*    GFR: Estimated Creatinine Clearance: 12.7 mL/min (A) (by C-G formula based on SCr of 4.54 mg/dL (H)).  Liver Function Tests: Recent Labs  Lab 10/11/19 1143 10/12/19 0712  AST 23 14*  ALT 13 8  ALKPHOS 112 93  BILITOT 0.4 0.2*  PROT 8.3* 6.6  ALBUMIN 2.9* 2.4*    CBG: Recent Labs  Lab 10/13/19 1220 10/13/19 1644 10/13/19 2114 10/14/19 0612 10/14/19 0641  GLUCAP 104* 91 90 63* 80     Recent Results (from the past 240 hour(s))  Blood culture (routine x 2)      Status: None (Preliminary result)   Collection Time: 10/11/19 12:30 PM   Specimen: BLOOD RIGHT ARM  Result Value Ref Range Status   Specimen Description BLOOD RIGHT ARM  Final   Special Requests   Final    BOTTLES DRAWN AEROBIC AND ANAEROBIC Blood Culture results may not be optimal due to an inadequate volume of blood received in culture bottles   Culture   Final    NO GROWTH 3 DAYS Performed at [facility], [place] [address], [place], [place] [address]    Report Status PENDING  Incomplete  Blood culture (routine x 2)     Status: None (Preliminary result)   Collection Time: 10/11/19 12:45 PM   Specimen: BLOOD  Result Value Ref Range Status   Specimen Description BLOOD LEFT ANTECUBITAL  Final   Special Requests   Final    BOTTLES DRAWN AEROBIC AND ANAEROBIC Blood Culture adequate volume   Culture   Final    NO GROWTH 3 DAYS Performed at [facility], [place] [address], [place], [place] [address]    Report Status PENDING  Incomplete  Resp Panel by RT PCR (RSV,  Flu A&B, Covid) - Nasopharyngeal Swab     Status: None   Collection Time: 10/11/19  4:00 PM   Specimen: Nasopharyngeal Swab  Result Value Ref Range Status   SARS Coronavirus 2 by RT PCR NEGATIVE NEGATIVE Final    Comment: (NOTE) SARS-CoV-2 target nucleic acids are NOT DETECTED.  The SARS-CoV-2 RNA is generally detectable in upper respiratoy specimens during the acute phase of infection. The lowest concentration of SARS-CoV-2 viral copies this assay can detect is 131 copies/mL. A negative result does not preclude SARS-Cov-2 infection and should not be used as the sole basis for treatment or other patient management decisions. A negative result may occur with  improper specimen collection/handling, submission of specimen other than nasopharyngeal swab, presence of viral mutation(s) within the areas targeted by this assay, and inadequate number of viral copies (<131 copies/mL). A negative result must be combined with  clinical observations, patient history, and epidemiological information. The expected result is Negative.  Fact Sheet for Patients:  PinkCheek.be  Fact Sheet for Healthcare Providers:  GravelBags.it  This test is no t yet approved or cleared by the [place] FDA and  has been authorized for detection and/or diagnosis of SARS-CoV-2 by FDA under an Emergency Use Authorization (EUA). This EUA will remain  in effect (meaning this test can be used) for the duration of the COVID-19 declaration under Section 564(b)(1) of the Act, 21 U.S.C. section 360bbb-3(b)(1), unless the authorization is terminated or revoked sooner.     Influenza A by PCR NEGATIVE NEGATIVE Final   Influenza B by PCR NEGATIVE NEGATIVE Final    Comment: (NOTE) The Xpert Xpress SARS-CoV-2/FLU/RSV assay is intended as an aid in  the diagnosis of influenza from Nasopharyngeal swab specimens and  should not be used as a sole basis for treatment. Nasal washings and  aspirates are unacceptable for Xpert Xpress SARS-CoV-2/FLU/RSV  testing.  Fact Sheet for Patients: PinkCheek.be  Fact Sheet for Healthcare Providers: GravelBags.it  This test is not yet approved or cleared by the [place] FDA and  has been authorized for detection and/or diagnosis of SARS-CoV-2 by  FDA under an Emergency Use Authorization (EUA). This EUA will remain  in effect (meaning this test can be used) for the duration of the  Covid-19 declaration under Section 564(b)(1) of the Act, 21  U.S.C. section 360bbb-3(b)(1), unless the authorization is  terminated or revoked.    Respiratory Syncytial Virus by PCR NEGATIVE NEGATIVE Final    Comment: (NOTE) Fact Sheet for Patients: PinkCheek.be  Fact Sheet for Healthcare Providers: GravelBags.it  This test is not yet approved  or cleared by the [place] FDA and  has been authorized for detection and/or diagnosis of SARS-CoV-2 by  FDA under an Emergency Use Authorization (EUA). This EUA will remain  in effect (meaning this test can be used) for the duration of the  COVID-19 declaration under Section 564(b)(1) of the Act, 21 U.S.C.  section 360bbb-3(b)(1), unless the authorization is terminated or  revoked. Performed at [facility], [place] [address], [place], [place] [address]          Radiology Studies: No results found.      Scheduled Meds: . HYDROmorphone      . acetaminophen      . aspirin EC  81 mg Oral Daily  . atorvastatin  80 mg Oral Q2000  . carvedilol  25 mg Oral BID WC  . Chlorhexidine Gluconate Cloth  6 each Topical Daily  . Chlorhexidine Gluconate Cloth  6 each Topical Q0600  . heparin  5,000 Units Subcutaneous Q8H  . hydrALAZINE  50 mg Oral TID  . insulin aspart  0-5 Units Subcutaneous QHS  . insulin aspart  0-9 Units Subcutaneous TID WC  . isosorbide mononitrate  60 mg Oral Daily  . ketorolac  30 mg Intravenous Once  . lactulose  10 g Oral TID  . linaclotide  145 mcg Oral QAC breakfast  . ranolazine  500 mg Oral BID  . sevelamer carbonate  800 mg Oral TID WC   Continuous Infusions: . Vancomycin    . cefTRIAXone (ROCEPHIN)  IV 2 g (10/13/19 1225)  . metronidazole 500 mg (10/14/19 0457)  . vancomycin    . vancomycin 750 mg (10/14/19 1022)     LOS: 3 days   Time spent: 24mins Greater than 50% of this time was spent in counseling, explanation of diagnosis, planning of further management, and coordination of care.  I have personally reviewed and interpreted on  10/14/2019 daily labs, tele strips, imagings as discussed above under date review session and assessment and plans.  I reviewed all nursing notes, pharmacy notes, consultant notes,  vitals, pertinent old records  I have discussed plan of care as described above with RN , patient  on 10/14/2019  Voice  Recognition /Dragon dictation system was used to create this note, attempts have been made to correct errors. Please contact the author with questions and/or clarifications.   Florencia Reasons, MD PhD FACP [facility]  Available via Epic secure chat 7am-7pm for nonurgent issues Please page for urgent issues To page the attending provider between 7A-7P or the covering provider during after hours 7P-7A, please log into the web site [email or web address] and access using universal [place] password for that web site. If you do not have the password, please call the hospital operator.    10/14/2019, 10:30 AM

## 2019-10-14 NOTE — Progress Notes (Signed)
[facility] Progress Note  Subjective: seen on HD. no new c/o. Seen by VVS , likely will need BKA   Vitals:   10/14/19 1120 10/14/19 1129 10/14/19 1135 10/14/19 1150  BP:    (!) 160/54  Pulse:      Resp: 13 12 12 12   Temp:    98.5 F (36.9 C)  TempSrc:    Oral  SpO2:    98%  Weight:    70 kg  Height:        Exam: General: WNWD woman in bed, nad  Head: NCAT sclera not icteric MMM Neck: Supple. No JVD No masses Lungs: CTA bilaterally  Heart: RRR with S1 S2 Abdomen: soft NT + BS Lower ext: L TMA with draining ulcer, foul odor, trace edema Neuro: A & O  X 3. Moves all extremities spontaneously. Dialysis Access: LUE AVF +bruit   Dialysis: AF MWF   3h 45 min 400/800   76kg   3K/2.25Ca  P 2  AVF No heparin  Mircera 50 q 2 weeks last 9/6  Hectorol 5, Sensipar 30    Assessment/Plan: 1. Left foot osteomyelitis - s/p L transmet amp 2017. Ortho following, will likely need BKA. IV antibiotics per primary. seen by VVS as well.  2. ESRD -  HD MWF. HD today (rolled over from yest) 3. Rectal pain /Constipation - per primary. S/p disimpaction in ED 10/4.  4. Hypertension/volume  - BP elevated. Continue home meds. Below EDW by weights here. Challenge EDW w UF 5. Anemia  - Hgb 10.7 No ESA needs. Follow trends.  6. Metabolic bone disease -  Ca ok. Continue Hectorol Sensipar Velphoro binder  7. Nutrition - Renal diet/prot supp for low albumin  8. DMT2 - insulin per primary       Rob Tyger Oka 10/14/2019, 12:19 PM   Recent Labs  Lab 10/12/19 0712 10/12/19 0712 10/13/19 0038 10/14/19 0601  K 3.7  --   --  3.6  BUN 25*  --   --  28*  CREATININE 3.87*  --   --  4.54*  CALCIUM 8.2*  --   --  8.2*  HGB 10.7*   < > 9.5* 10.3*   < > = values in this interval not displayed.   Inpatient medications: . HYDROmorphone      . acetaminophen      . aspirin EC  81 mg Oral Daily  . atorvastatin  80 mg Oral Q2000  . carvedilol  25 mg Oral BID WC  . Chlorhexidine  Gluconate Cloth  6 each Topical Daily  . Chlorhexidine Gluconate Cloth  6 each Topical Q0600  . heparin  5,000 Units Subcutaneous Q8H  . hydrALAZINE  50 mg Oral TID  . insulin aspart  0-5 Units Subcutaneous QHS  . insulin aspart  0-9 Units Subcutaneous TID WC  . isosorbide mononitrate  60 mg Oral Daily  . ketorolac  30 mg Intravenous Once  . lactulose  10 g Oral TID  . linaclotide  145 mcg Oral QAC breakfast  . ranolazine  500 mg Oral BID  . sevelamer carbonate  800 mg Oral TID WC   . Vancomycin    . cefTRIAXone (ROCEPHIN)  IV 2 g (10/13/19 1225)  . metronidazole 500 mg (10/14/19 0457)  . vancomycin     acetaminophen, HYDROmorphone (DILAUDID) injection, polyethylene glycol

## 2019-10-14 NOTE — Progress Notes (Signed)
Patient returned from dialysis, VS stable, pt back in room.

## 2019-10-14 NOTE — Progress Notes (Signed)
Patient left for dialysis.

## 2019-10-14 NOTE — Progress Notes (Signed)
   Patient evaluated on dialysis.  She is sleeping at the time of my exam.  ABIs and left lower extremity duplex pending, she is high risk for below-knee amputation on the left.  Rebekah Johnson C. Donzetta Matters, MD [facility] Office: [phone] Pager: [phone]

## 2019-10-15 ENCOUNTER — Inpatient Hospital Stay (HOSPITAL_COMMUNITY): Payer: Medicare Other

## 2019-10-15 DIAGNOSIS — N186 End stage renal disease: Secondary | ICD-10-CM | POA: Diagnosis not present

## 2019-10-15 DIAGNOSIS — E119 Type 2 diabetes mellitus without complications: Secondary | ICD-10-CM | POA: Diagnosis not present

## 2019-10-15 DIAGNOSIS — M86179 Other acute osteomyelitis, unspecified ankle and foot: Secondary | ICD-10-CM | POA: Diagnosis not present

## 2019-10-15 DIAGNOSIS — Z794 Long term (current) use of insulin: Secondary | ICD-10-CM | POA: Diagnosis not present

## 2019-10-15 LAB — CBC WITH DIFFERENTIAL/PLATELET
Abs Immature Granulocytes: 0.04 10*3/uL (ref 0.00–0.07)
Basophils Absolute: 0 10*3/uL (ref 0.0–0.1)
Basophils Relative: 0 %
Eosinophils Absolute: 0.1 10*3/uL (ref 0.0–0.5)
Eosinophils Relative: 1 %
HCT: 34.1 % — ABNORMAL LOW (ref 36.0–46.0)
Hemoglobin: 10.4 g/dL — ABNORMAL LOW (ref 12.0–15.0)
Immature Granulocytes: 0 %
Lymphocytes Relative: 22 %
Lymphs Abs: 2.1 10*3/uL (ref 0.7–4.0)
MCH: 27.6 pg (ref 26.0–34.0)
MCHC: 30.5 g/dL (ref 30.0–36.0)
MCV: 90.5 fL (ref 80.0–100.0)
Monocytes Absolute: 1.1 10*3/uL — ABNORMAL HIGH (ref 0.1–1.0)
Monocytes Relative: 12 %
Neutro Abs: 6.3 10*3/uL (ref 1.7–7.7)
Neutrophils Relative %: 65 %
Platelets: 300 10*3/uL (ref 150–400)
RBC: 3.77 MIL/uL — ABNORMAL LOW (ref 3.87–5.11)
RDW: 15.9 % — ABNORMAL HIGH (ref 11.5–15.5)
WBC: 9.7 10*3/uL (ref 4.0–10.5)
nRBC: 0 % (ref 0.0–0.2)

## 2019-10-15 LAB — GLUCOSE, CAPILLARY
Glucose-Capillary: 92 mg/dL (ref 70–99)
Glucose-Capillary: 100 mg/dL — ABNORMAL HIGH (ref 70–99)
Glucose-Capillary: 84 mg/dL (ref 70–99)
Glucose-Capillary: 116 mg/dL — ABNORMAL HIGH (ref 70–99)

## 2019-10-15 MED ORDER — ONDANSETRON HCL 4 MG/2ML IJ SOLN
4.0000 mg | Freq: Four times a day (QID) | INTRAMUSCULAR | Status: AC | PRN
  Administered 2019-10-15 – 2019-10-17 (×3): 4 mg via INTRAVENOUS
  Filled 2019-10-15 (×3): qty 2

## 2019-10-15 NOTE — Progress Notes (Signed)
[facility] Progress Note  Subjective: seen in room, sp BM's and now having loose stools.   Vitals:   10/15/19 0400 10/15/19 0500 10/15/19 0600 10/15/19 0752  BP:    (!) 158/58  Pulse: (!) 57 64 73 73  Resp: 17 (!) 23 11 15   Temp:    98.7 F (37.1 C)  TempSrc:    Oral  SpO2: 95% 97% 96% 98%  Weight:      Height:        Exam: General: WNWD woman in bed, nad  Head: NCAT sclera not icteric MMM Neck: Supple. No JVD No masses Lungs: CTA bilaterally  Heart: RRR with S1 S2 Abdomen: soft NT + BS Lower ext: L TMA with draining ulcer, foul odor, trace edema Neuro: A & O  X 3. Moves all extremities spontaneously. Dialysis Access: LUE AVF +bruit   Dialysis: AF MWF   3h 45 min 400/800   76kg   3K/2.25Ca  P 2  AVF No heparin  Mircera 50 q 2 weeks last 9/6  Hectorol 5, Sensipar 30    Assessment/Plan: 1. Left foot osteomyelitis - s/p L transmet amp 2017. Ortho following, will likely need BKA. IV antibiotics per primary. seen by VVS who are also recommending BKA or AKA.  2. ESRD -  HD MWF. HD Monday.  3. Rectal pain /Constipation - per primary. S/p disimpaction in ED 10/4. Seems to have resolved.  4. Hypertension/volume  - BP elevated. Continue home meds. Below EDW by 6kg here.  Prob euvolemic now.  5. Anemia  - Hgb 10.7 No ESA needs. Follow trends.  6. Metabolic bone disease -  Ca ok. Continue Hectorol Sensipar Velphoro binder  7. Nutrition - Renal diet/prot supp for low albumin  8. DMT2 - insulin per primary       Rob Lee-Anne Flicker 10/15/2019, 1:43 PM   Recent Labs  Lab 10/14/19 0601 10/14/19 0601 10/14/19 1450 10/14/19 1804 10/15/19 0122  K 3.6  --   --  3.1*  --   BUN 28*  --   --  7*  --   CREATININE 4.54*  --   --  2.38*  --   CALCIUM 8.2*  --   --  8.2*  --   PHOS  --   --   --  2.1*  --   HGB 10.3*   < > 10.4*  --  10.4*   < > = values in this interval not displayed.   Inpatient medications: . aspirin EC  81 mg Oral Daily  . atorvastatin  80 mg  Oral Q2000  . carvedilol  25 mg Oral BID WC  . Chlorhexidine Gluconate Cloth  6 each Topical Daily  . Chlorhexidine Gluconate Cloth  6 each Topical Q0600  . heparin  5,000 Units Subcutaneous Q8H  . hydrALAZINE  50 mg Oral TID  . insulin aspart  0-5 Units Subcutaneous QHS  . insulin aspart  0-9 Units Subcutaneous TID WC  . isosorbide mononitrate  60 mg Oral Daily  . ketorolac  30 mg Intravenous Once  . lactulose  10 g Oral TID  . linaclotide  145 mcg Oral QAC breakfast  . ranolazine  500 mg Oral BID  . sevelamer carbonate  800 mg Oral TID WC   . sodium chloride    . sodium chloride    . cefTRIAXone (ROCEPHIN)  IV 2 g (10/15/19 1234)  . metronidazole 500 mg (10/15/19 0615)  . vancomycin     sodium chloride,  sodium chloride, acetaminophen, alteplase, heparin, HYDROmorphone (DILAUDID) injection, lidocaine (PF), lidocaine-prilocaine, ondansetron (ZOFRAN) IV, pentafluoroprop-tetrafluoroeth, polyethylene glycol

## 2019-10-15 NOTE — Progress Notes (Signed)
  Progress Note    10/15/2019 11:05 AM * No surgery found *  Subjective: Wants to go home  Vitals:   10/15/19 0600 10/15/19 0752  BP:  (!) 158/58  Pulse: 73 73  Resp: 11 15  Temp:  98.7 F (37.1 C)  SpO2: 96% 98%    Physical Exam: Palpable femoral pulses bilaterally Left foot with nonsalvageable gangrenous changes previously healed transmetatarsal amputation  CBC    Component Value Date/Time   WBC 9.7 10/15/2019 0122   RBC 3.77 (L) 10/15/2019 0122   HGB 10.4 (L) 10/15/2019 0122   HGB 12.9 04/27/2019 0951   HGB 11.6 01/14/2017 0958   HCT 34.1 (L) 10/15/2019 0122   HCT 35.9 01/14/2017 0958   PLT 300 10/15/2019 0122   PLT 334 04/27/2019 0951   PLT 308 01/14/2017 0958   MCV 90.5 10/15/2019 0122   MCV 81 01/14/2017 0958   MCH 27.6 10/15/2019 0122   MCHC 30.5 10/15/2019 0122   RDW 15.9 (H) 10/15/2019 0122   RDW 14.9 01/14/2017 0958   LYMPHSABS 2.1 10/15/2019 0122   LYMPHSABS 2.6 01/14/2017 0958   MONOABS 1.1 (H) 10/15/2019 0122   EOSABS 0.1 10/15/2019 0122   EOSABS 0.2 01/14/2017 0958   BASOSABS 0.0 10/15/2019 0122   BASOSABS 0.1 01/14/2017 0958    BMET    Component Value Date/Time   NA 139 10/14/2019 1804   NA 141 10/07/2017 1234   K 3.1 (L) 10/14/2019 1804   CL 96 (L) 10/14/2019 1804   CO2 32 10/14/2019 1804   GLUCOSE 93 10/14/2019 1804   BUN 7 (L) 10/14/2019 1804   BUN 33 (H) 10/07/2017 1234   CREATININE 2.38 (H) 10/14/2019 1804   CREATININE 4.15 (HH) 04/27/2019 0951   CREATININE 1.19 (H) 12/18/2015 1006   CALCIUM 8.2 (L) 10/14/2019 1804   GFRNONAA 21 (L) 10/14/2019 1804   GFRNONAA 11 (L) 04/27/2019 0951   GFRNONAA 51 (L) 12/18/2015 1006   GFRAA 7 (L) 10/09/2019 0717   GFRAA 13 (L) 04/27/2019 0951   GFRAA 59 (L) 12/18/2015 1006    INR    Component Value Date/Time   INR 0.98 03/02/2017 0645     Intake/Output Summary (Last 24 hours) at 10/15/2019 1105 Last data filed at 10/15/2019 1048 Gross per 24 hour  Intake 480 ml  Output 2550 ml   Net -2070 ml   Summary:  Left: 30-49% stenosis noted in the superficial femoral artery. Patent left  popliteal artery stent. Three vessel monophasic runoff.   Assessment/plan:  59 y.o. female is here with extensive gangrenous changes of her left transmetatarsal amputation site.  I reviewed her duplex she does not appear to have any further vascular intervention would be best served with transtibial or above-knee amputation on the left.   Kandise Riehle C. Donzetta Matters, MD [facility] [place] Office: [phone] Pager: [phone]  10/15/2019 11:05 AM

## 2019-10-15 NOTE — Progress Notes (Addendum)
PROGRESS NOTE    [name]  BHA:[id number] DOB: 1960/04/01 DOA: 10/11/2019 PCP: Rebekah Johnson, No Pcp Per    Chief Complaint  Rebekah Johnson presents with  . Fecal Impaction    Brief Narrative:  Rebekah Johnson is a 59 year old African-American female with history of hypertension, hyperlipidemia, ESRD on hemodialysis MWF, coronary artery disease s/p stent placement, chronic diastolic congestive heart failure, diabetes mellitus with neuropathy, peripheral arterial disease and transmetatarsal left foot amputation presented to ED complaining of severe left foot pain and abdominal pain secondary to constipation. Rebekah Johnson is also complaining of rectal pain secondary to severe constipation. Rebekah Johnson also had stent placement to her distal popliteal artery secondary to severe peripheral vascular disease. Rebekah Johnson is on home opiates that resulted in severe constipation. She was disimpacted under sedation and discharged home with bowel regimen. In the ED CT scan of abdomen and pelvis showed proctitis and x-ray of left foot showed osteomyelitis.  Rebekah Johnson is started on broad-spectrum antibiotics and IV Dilaudid every 6 hours as needed by the admitting physician for 10 out of 10 foot pain.  Subjective:  Constipation is resolved, now she started have multiple bowel movement that is pasty and brown She also vomited this morning  Assessment & Plan:   Active Problems:   Osteomyelitis of foot, acute (HCC)   Non-pressure chronic ulcer of other part of left foot limited to breakdown of skin (HCC)   Pyogenic inflammation of bone (HCC)  Acute osteomyelitis of left foot, present on admission -Rebekah Johnson previously had transmetatarsal amputation in 2017 and recently again had that foot infection and completed course of doxycycline for 3 weeks as outpatient.   -Rebekah Johnson presented with severe pain in her left foot and x-ray of left foot is positive for left foot osteomyelitis.  -Continue IV antibiotics with vancomycin through  dialysis,  IV Rocephin and Flagyl.  -Orthopedic surgery consulted and they think that Rebekah Johnson does not need amputation at this time and advised consult with vascular surgery.  -Vascular surgery consulted who ordered ABI and left lower extremity arterial duplex ultrasound.   -will follow vascular surgery recommendation.  Non-pressure chronic ulcer of other part of left foot limited to breakdown of skin  Continue IV antibiotic with vanc through dialysis,  IV ceftriaxone, and IV metronidazole.  Rectal pain secondary to proctitis with severe constipation /fecal impaction on presentation Rebekah Johnson was complaining of severe rectal pain and CT scan showed proctitis. Underwent digital disimpaction under sedation in the ED Continue IV antibiotics and pain control. Blood cultures no growth, constipation resolved  Vomiting On 10/10 am Resolved later,  kub pending addendum: kub unremarkable  ESRD on hemodialysis Dialysis per Nephrology .  Hypertension Stable. Continue home medications  Hyperlipidemia Lipitor  Diastolic heart failure Rebekah Johnson had grade 1 diastolic dysfunction on most recent echo. Rebekah Johnson is not in acute exacerbation of heart failure at this time. Continue home medications. Strict ins and outs and daily weight Continue dialysis   Diabetes mellitus with neuropathy Continue sliding scale insulin Blood glucose monitoring and hypoglycemic protocol in place  Peripheral arterial disease Continue aspirin and statin.   DVT prophylaxis: heparin injection 5,000 Units Start: 10/11/19 2200 SCDs Start: 10/11/19 1702   Code Status:full  Family Communication: Rebekah Johnson Disposition:   Status is: Inpatient  Dispo:  Rebekah Johnson From: Home  Planned Disposition: Home with Health Care Svc vs SNF if she undergo amputation   Expected discharge date: TBD  Medically stable for discharge: No   Consultants:   Nephrology  Vascular surgery  Procedures:  Dialysis  Antimicrobials:   Vanc, Rocephin and Flagyl     Objective: Vitals:   10/15/19 0400 10/15/19 0500 10/15/19 0600 10/15/19 0752  BP:    (!) 158/58  Pulse: (!) 57 64 73 73  Resp: 17 (!) 23 11 15   Temp:    98.7 F (37.1 C)  TempSrc:    Oral  SpO2: 95% 97% 96% 98%  Weight:      Height:        Intake/Output Summary (Last 24 hours) at 10/15/2019 1405 Last data filed at 10/15/2019 1048 Gross per 24 hour  Intake 240 ml  Output 550 ml  Net -310 ml   Filed Weights   10/11/19 1802 10/14/19 0737 10/14/19 1150  Weight: 74 kg 72 kg 70 kg    Examination:  General exam: does not appear comfortable  Respiratory system: Clear to auscultation. Respiratory effort normal. Cardiovascular system: S1 & S2 heard, RRR. No JVD, no murmur, No pedal edema. Gastrointestinal system: Abdomen is nondistended, soft and nontender. Normal bowel sounds heard. Central nervous system: Alert and oriented. No focal neurological deficits. Extremities: Generalized weakness, left lower extremity TMA with draining ulcer +odor, LUE AVF  Psychiatry: flat affect.     Data Reviewed: I have personally reviewed following labs and imaging studies  CBC: Recent Labs  Lab 10/09/19 0717 10/11/19 1130 10/12/19 0712 10/13/19 0038 10/14/19 0601 10/14/19 1450 10/15/19 0122  WBC 14.8*   < > 12.1* 10.1 10.1 10.0 9.7  NEUTROABS 11.0*  --  8.5* 7.1 7.2  --  6.3  HGB 11.2*   < > 10.7* 9.5* 10.3* 10.4* 10.4*  HCT 36.4   < > 35.8* 31.1* 33.4* 33.6* 34.1*  MCV 90.8   < > 92.3 91.2 92.8 89.8 90.5  PLT 380   < > 329 306 305 276 300   < > = values in this interval not displayed.    Basic Metabolic Panel: Recent Labs  Lab 10/09/19 0717 10/11/19 1143 10/12/19 0712 10/14/19 0601 10/14/19 1804  NA 134* 139 138 138 139  K 3.9 4.2 3.7 3.6 3.1*  CL 97* 96* 100 102 96*  CO2 22 27 26 24  32  GLUCOSE 103* 126* 97 78 93  BUN 51* 22 25* 28* 7*  CREATININE 6.53* 3.66* 3.87* 4.54* 2.38*  CALCIUM 8.8* 8.8*  8.2* 8.2* 8.2*  PHOS  --   --   --   --  2.1*    GFR: Estimated Creatinine Clearance: 23.8 mL/min (A) (by C-G formula based on SCr of 2.38 mg/dL (H)).  Liver Function Tests: Recent Labs  Lab 10/11/19 1143 10/12/19 0712 10/14/19 1804  AST 23 14*  --   ALT 13 8  --   ALKPHOS 112 93  --   BILITOT 0.4 0.2*  --   PROT 8.3* 6.6  --   ALBUMIN 2.9* 2.4* 2.3*    CBG: Recent Labs  Lab 10/14/19 1313 10/14/19 1642 10/14/19 2132 10/15/19 0539 10/15/19 1116  GLUCAP 93 121* 112* 92 100*     Recent Results (from the past 240 hour(s))  Blood culture (routine x 2)     Status: None (Preliminary result)   Collection Time: 10/11/19 12:30 PM   Specimen: BLOOD RIGHT ARM  Result Value Ref Range Status   Specimen Description BLOOD RIGHT ARM  Final   Special Requests   Final    BOTTLES DRAWN AEROBIC AND ANAEROBIC Blood Culture results may not be optimal due to an inadequate volume of blood received in culture  bottles   Culture   Final    NO GROWTH 3 DAYS Performed at [facility], [place] [address] [place], [place] [address]    Report Status PENDING  Incomplete  Blood culture (routine x 2)     Status: None (Preliminary result)   Collection Time: 10/11/19 12:45 PM   Specimen: BLOOD  Result Value Ref Range Status   Specimen Description BLOOD LEFT ANTECUBITAL  Final   Special Requests   Final    BOTTLES DRAWN AEROBIC AND ANAEROBIC Blood Culture adequate volume   Culture   Final    NO GROWTH 3 DAYS Performed at [facility], [place] [address] [place], [place] [address]    Report Status PENDING  Incomplete  Resp Panel by RT PCR (RSV, Flu A&B, Covid) - Nasopharyngeal Swab     Status: None   Collection Time: 10/11/19  4:00 PM   Specimen: Nasopharyngeal Swab  Result Value Ref Range Status   SARS Coronavirus 2 by RT PCR NEGATIVE NEGATIVE Final    Comment: (NOTE) SARS-CoV-2 target nucleic acids are NOT DETECTED.  The SARS-CoV-2 RNA is generally detectable in upper  respiratoy specimens during the acute phase of infection. The lowest concentration of SARS-CoV-2 viral copies this assay can detect is 131 copies/mL. A negative result does not preclude SARS-Cov-2 infection and should not be used as the sole basis for treatment or other Rebekah Johnson management decisions. A negative result may occur with  improper specimen collection/handling, submission of specimen other than nasopharyngeal swab, presence of viral mutation(s) within the areas targeted by this assay, and inadequate number of viral copies (<131 copies/mL). A negative result must be combined with clinical observations, Rebekah Johnson history, and epidemiological information. The expected result is Negative.  Fact Sheet for Patients:  PinkCheek.be  Fact Sheet for Healthcare Providers:  GravelBags.it  This test is no t yet approved or cleared by the [place] FDA and  has been authorized for detection and/or diagnosis of SARS-CoV-2 by FDA under an Emergency Use Authorization (EUA). This EUA will remain  in effect (meaning this test can be used) for the duration of the COVID-19 declaration under Section 564(b)(1) of the Act, 21 U.S.C. section 360bbb-3(b)(1), unless the authorization is terminated or revoked sooner.     Influenza A by PCR NEGATIVE NEGATIVE Final   Influenza B by PCR NEGATIVE NEGATIVE Final    Comment: (NOTE) The Xpert Xpress SARS-CoV-2/FLU/RSV assay is intended as an aid in  the diagnosis of influenza from Nasopharyngeal swab specimens and  should not be used as a sole basis for treatment. Nasal washings and  aspirates are unacceptable for Xpert Xpress SARS-CoV-2/FLU/RSV  testing.  Fact Sheet for Patients: PinkCheek.be  Fact Sheet for Healthcare Providers: GravelBags.it  This test is not yet approved or cleared by the [place] FDA and  has been  authorized for detection and/or diagnosis of SARS-CoV-2 by  FDA under an Emergency Use Authorization (EUA). This EUA will remain  in effect (meaning this test can be used) for the duration of the  Covid-19 declaration under Section 564(b)(1) of the Act, 21  U.S.C. section 360bbb-3(b)(1), unless the authorization is  terminated or revoked.    Respiratory Syncytial Virus by PCR NEGATIVE NEGATIVE Final    Comment: (NOTE) Fact Sheet for Patients: PinkCheek.be  Fact Sheet for Healthcare Providers: GravelBags.it  This test is not yet approved or cleared by the [place] FDA and  has been authorized for detection and/or diagnosis of SARS-CoV-2 by  FDA under an Emergency Use Authorization (EUA). This EUA will remain  in effect (meaning this test can be used) for the duration of the  COVID-19 declaration under Section 564(b)(1) of the Act, 21 U.S.C.  section 360bbb-3(b)(1), unless the authorization is terminated or  revoked. Performed at [facility], [place] [address], [place], [place] [address]          Radiology Studies: VAS [place] LOWER EXTREMITY ARTERIAL DUPLEX  Result Date: 10/15/2019 LOWER EXTREMITY ARTERIAL DUPLEX STUDY Indications: Gangrene, and peripheral artery disease. High Risk Factors: Hypertension, hyperlipidemia, Diabetes, past history of                    smoking: 42 pack year history.  Current ABI: Not obtained Comparison Study: 09/15/2019- lower extremity arterial duplex Performing Technologist: Maudry Mayhew MHA, RDMS, RVT, RDCS  Examination Guidelines: A complete evaluation includes B-mode imaging, spectral Doppler, color Doppler, and power Doppler as needed of all accessible portions of each vessel. Bilateral testing is considered an integral part of a complete examination. Limited examinations for reoccurring indications may be performed as noted.   +-----------+--------+-----+---------------+----------+--------+ LEFT       PSV cm/sRatioStenosis       Waveform  Comments +-----------+--------+-----+---------------+----------+--------+ CFA Distal 81                          monophasic         +-----------+--------+-----+---------------+----------+--------+ DFA        82                          monophasic         +-----------+--------+-----+---------------+----------+--------+ SFA Prox   180          30-49% stenosismonophasic         +-----------+--------+-----+---------------+----------+--------+ SFA Mid    118                         monophasic         +-----------+--------+-----+---------------+----------+--------+ SFA Distal 90                          monophasic         +-----------+--------+-----+---------------+----------+--------+ POP Prox   64                          monophasic         +-----------+--------+-----+---------------+----------+--------+ POP Distal 63                          monophasic         +-----------+--------+-----+---------------+----------+--------+ TP Trunk   104                         monophasic         +-----------+--------+-----+---------------+----------+--------+ ATA Prox   15                          monophasic         +-----------+--------+-----+---------------+----------+--------+ ATA Mid    33                          monophasic         +-----------+--------+-----+---------------+----------+--------+ ATA Distal 42  monophasic         +-----------+--------+-----+---------------+----------+--------+ PTA Prox   57                          monophasic         +-----------+--------+-----+---------------+----------+--------+ PTA Mid    57                          monophasic         +-----------+--------+-----+---------------+----------+--------+ PTA Distal 53                          monophasic          +-----------+--------+-----+---------------+----------+--------+ PERO Prox  34                          monophasic         +-----------+--------+-----+---------------+----------+--------+ PERO Mid   20                          monophasic         +-----------+--------+-----+---------------+----------+--------+ PERO Distal45                          monophasic         +-----------+--------+-----+---------------+----------+--------+ DP         49                          monophasic         +-----------+--------+-----+---------------+----------+--------+  Summary: Left: 30-49% stenosis noted in the superficial femoral artery. Patent left popliteal artery stent. Three vessel monophasic runoff.  See table(s) above for measurements and observations. Electronically signed by Servando Snare MD on 10/15/2019 at 11:32:44 AM.    Final         Scheduled Meds: . aspirin EC  81 mg Oral Daily  . atorvastatin  80 mg Oral Q2000  . carvedilol  25 mg Oral BID WC  . Chlorhexidine Gluconate Cloth  6 each Topical Daily  . Chlorhexidine Gluconate Cloth  6 each Topical Q0600  . heparin  5,000 Units Subcutaneous Q8H  . hydrALAZINE  50 mg Oral TID  . insulin aspart  0-5 Units Subcutaneous QHS  . insulin aspart  0-9 Units Subcutaneous TID WC  . isosorbide mononitrate  60 mg Oral Daily  . ketorolac  30 mg Intravenous Once  . lactulose  10 g Oral TID  . linaclotide  145 mcg Oral QAC breakfast  . ranolazine  500 mg Oral BID  . sevelamer carbonate  800 mg Oral TID WC   Continuous Infusions: . sodium chloride    . sodium chloride    . cefTRIAXone (ROCEPHIN)  IV 2 g (10/15/19 1234)  . metronidazole 500 mg (10/15/19 0615)  . vancomycin       LOS: 4 days   Time spent: 16mins Greater than 50% of this time was spent in counseling, explanation of diagnosis, planning of further management, and coordination of care.  I have personally reviewed and interpreted on  10/15/2019 daily labs, tele  strips, imagings as discussed above under date review session and assessment and plans.  I reviewed all nursing notes, pharmacy notes, consultant notes,  vitals, pertinent old records  I have discussed plan of care as described above with RN ,  Rebekah Johnson  on 10/15/2019  Voice Recognition /Dragon dictation system was used to create this note, attempts have been made to correct errors. Please contact the author with questions and/or clarifications.   Florencia Reasons, MD PhD FACP [facility]  Available via Epic secure chat 7am-7pm for nonurgent issues Please page for urgent issues To page the attending provider between 7A-7P or the covering provider during after hours 7P-7A, please log into the web site [email or web address] and access using universal [place] password for that web site. If you do not have the password, please call the hospital operator.    10/15/2019, 2:05 PM

## 2019-10-15 NOTE — Progress Notes (Signed)
Patient refused rectal tube.

## 2019-10-15 NOTE — Progress Notes (Signed)
Pt had several bouts of watery emesis. MD notified. New orders received.

## 2019-10-16 ENCOUNTER — Other Ambulatory Visit: Payer: Self-pay | Admitting: Physician Assistant

## 2019-10-16 DIAGNOSIS — K6289 Other specified diseases of anus and rectum: Secondary | ICD-10-CM

## 2019-10-16 DIAGNOSIS — M86179 Other acute osteomyelitis, unspecified ankle and foot: Secondary | ICD-10-CM | POA: Diagnosis not present

## 2019-10-16 LAB — CBC WITH DIFFERENTIAL/PLATELET
Abs Immature Granulocytes: 0.06 10*3/uL (ref 0.00–0.07)
Basophils Absolute: 0.1 10*3/uL (ref 0.0–0.1)
Basophils Relative: 1 %
Eosinophils Absolute: 0.1 10*3/uL (ref 0.0–0.5)
Eosinophils Relative: 1 %
HCT: 32 % — ABNORMAL LOW (ref 36.0–46.0)
Hemoglobin: 10 g/dL — ABNORMAL LOW (ref 12.0–15.0)
Immature Granulocytes: 1 %
Lymphocytes Relative: 21 %
Lymphs Abs: 2 10*3/uL (ref 0.7–4.0)
MCH: 28.7 pg (ref 26.0–34.0)
MCHC: 31.3 g/dL (ref 30.0–36.0)
MCV: 92 fL (ref 80.0–100.0)
Monocytes Absolute: 1.2 10*3/uL — ABNORMAL HIGH (ref 0.1–1.0)
Monocytes Relative: 13 %
Neutro Abs: 6 10*3/uL (ref 1.7–7.7)
Neutrophils Relative %: 63 %
Platelets: 278 10*3/uL (ref 150–400)
RBC: 3.48 MIL/uL — ABNORMAL LOW (ref 3.87–5.11)
RDW: 16 % — ABNORMAL HIGH (ref 11.5–15.5)
WBC: 9.4 10*3/uL (ref 4.0–10.5)
nRBC: 0 % (ref 0.0–0.2)

## 2019-10-16 LAB — GLUCOSE, CAPILLARY
Glucose-Capillary: 93 mg/dL (ref 70–99)
Glucose-Capillary: 80 mg/dL (ref 70–99)
Glucose-Capillary: 192 mg/dL — ABNORMAL HIGH (ref 70–99)
Glucose-Capillary: 112 mg/dL — ABNORMAL HIGH (ref 70–99)

## 2019-10-16 LAB — CULTURE, BLOOD (ROUTINE X 2)
Culture: NO GROWTH
Culture: NO GROWTH
Special Requests: ADEQUATE

## 2019-10-16 MED ORDER — VANCOMYCIN HCL IN DEXTROSE 750-5 MG/150ML-% IV SOLN
INTRAVENOUS | Status: AC
  Administered 2019-10-16: 750 mg via INTRAVENOUS
  Filled 2019-10-16: qty 150

## 2019-10-16 MED ORDER — HYDROMORPHONE HCL 1 MG/ML IJ SOLN
INTRAMUSCULAR | Status: AC
  Administered 2019-10-16: 1 mg via INTRAVENOUS
  Filled 2019-10-16: qty 1

## 2019-10-16 NOTE — Progress Notes (Signed)
PROGRESS NOTE    Rebekah Johnson  GYF:[id number] DOB: 12/19/1960 DOA: 10/11/2019 PCP: Patient, No Pcp Per    Brief Narrative:  59 year old female with history of hypertension, hyperlipidemia, ESRD on dialysis Monday Wednesday Friday, coronary artery disease, chronic combined heart failure, diabetes with neuropathy and peripheral vascular disease, left metatarsal foot amputation presented to the hospital with severe left pain and abdominal pain.  Patient has severe peripheral arterial disease.  She is also on pain medication regimen with opiates at home causing severe constipation.  She was disimpacted under sedation and discharged home with bowel regimen on last follow-up. In the emergency room hemodynamically stable.  Patient was found with left foot osteomyelitis, CT abdomen showed proctitis.  Admitted for surgical intervention and now planned for transtibial amputation.   Assessment & Plan:   Active Problems:   Osteomyelitis of foot, acute (HCC)   Non-pressure chronic ulcer of other part of left foot limited to breakdown of skin (HCC)   Pyogenic inflammation of bone (HCC)  Acute osteomyelitis of the left foot present on admission with nonhealing ulcer and severe peripheral arterial disease: Currently remains on broad-spectrum antibiotics, however she will need definitive management.  Followed by orthopedics and vascular surgery.  She is planned for transtibial amputation on 10/13. Continue antibiotics until surgery to the clean margin.  Cultures negative so far.  Rectal pain/opiate induced constipation: On a scheduled laxative with improvement.  Avoid opiate as much possible.  ESRD on hemodialysis: Getting dialysis on her schedule.  Followed by nephrology.   DVT prophylaxis: heparin injection 5,000 Units Start: 10/11/19 2200 SCDs Start: 10/11/19 1702   Code Status: Full code Family Communication: None Disposition Plan: Status is: Inpatient  Remains inpatient appropriate  because:Inpatient level of care appropriate due to severity of illness   Dispo:  Patient From: Home  Planned Disposition: Home with Health Care Svc  Expected discharge date: 10/20/19  Medically stable for discharge: No          Consultants:   Nephrology  Orthopedics  Vascular  Procedures:   Dialysis  Antimicrobials:  Antibiotics Given (last 72 hours)    Date/Time Action Medication Dose Rate   10/13/19 1225 New Bag/Given   cefTRIAXone (ROCEPHIN) 2 g in sodium chloride 0.9 % 100 mL IVPB 2 g 200 mL/hr   10/13/19 1400 New Bag/Given   metroNIDAZOLE (FLAGYL) IVPB 500 mg 500 mg 100 mL/hr   10/13/19 2142 New Bag/Given   metroNIDAZOLE (FLAGYL) IVPB 500 mg 500 mg 100 mL/hr   10/14/19 0457 New Bag/Given   metroNIDAZOLE (FLAGYL) IVPB 500 mg 500 mg 100 mL/hr   10/14/19 1022 New Bag/Given   vancomycin (VANCOCIN) IVPB 750 mg/150 ml premix 750 mg 150 mL/hr   10/14/19 1255 New Bag/Given   cefTRIAXone (ROCEPHIN) 2 g in sodium chloride 0.9 % 100 mL IVPB 2 g 200 mL/hr   10/14/19 1447 New Bag/Given   metroNIDAZOLE (FLAGYL) IVPB 500 mg 500 mg 100 mL/hr   10/14/19 2249 New Bag/Given   metroNIDAZOLE (FLAGYL) IVPB 500 mg 500 mg 100 mL/hr   10/15/19 0615 New Bag/Given   metroNIDAZOLE (FLAGYL) IVPB 500 mg 500 mg 100 mL/hr   10/15/19 1234 New Bag/Given   cefTRIAXone (ROCEPHIN) 2 g in sodium chloride 0.9 % 100 mL IVPB 2 g 200 mL/hr   10/15/19 1440 New Bag/Given   metroNIDAZOLE (FLAGYL) IVPB 500 mg 500 mg 100 mL/hr   10/15/19 2300 New Bag/Given   metroNIDAZOLE (FLAGYL) IVPB 500 mg 500 mg 100 mL/hr   10/16/19 0530  New Bag/Given   metroNIDAZOLE (FLAGYL) IVPB 500 mg 500 mg 100 mL/hr   10/16/19 0945 New Bag/Given   vancomycin (VANCOCIN) IVPB 750 mg/150 ml premix 750 mg 150 mL/hr         Subjective: Patient seen and examined.  She was getting hemodialysis.  Denied any complaints.  No more nausea vomiting.  Had bowel movement today morning and her abdominal pain has relieved.  She had  discussion with surgeons and agreeable for transtibial amputation.  Objective: Vitals:   10/16/19 0842 10/16/19 0912 10/16/19 0942 10/16/19 1012  BP: (!) 131/47 (!) 133/56 (!) 118/98 (!) 146/54  Pulse:      Resp: 14 15 (!) 0 17  Temp:      TempSrc:      SpO2:      Weight:      Height:        Intake/Output Summary (Last 24 hours) at 10/16/2019 1044 Last data filed at 10/15/2019 1631 Gross per 24 hour  Intake 360 ml  Output 550 ml  Net -190 ml   Filed Weights   10/14/19 0737 10/14/19 1150 10/16/19 0730  Weight: 72 kg 70 kg 69.8 kg    Examination:  General exam: Appears calm and comfortable, on room air.  Getting dialysis. Respiratory system: Clear to auscultation. Respiratory effort normal. Cardiovascular system: S1 & S2 heard, RRR. Gastrointestinal system: Abdomen is nondistended, soft and nontender. No organomegaly or masses felt. Normal bowel sounds heard. Central nervous system: Alert and oriented. No focal neurological deficits. Extremities: Symmetric 5 x 5 power. Skin:  Patient has swollen, draining ulcer on the left lower extremity transmetatarsal amputation stump. Patient has left upper extremity AV fistula and she is getting dialysis through it.    Data Reviewed: I have personally reviewed following labs and imaging studies  CBC: Recent Labs  Lab 10/12/19 0712 10/12/19 0712 10/13/19 0038 10/14/19 0601 10/14/19 1450 10/15/19 0122 10/16/19 0227  WBC 12.1*   < > 10.1 10.1 10.0 9.7 9.4  NEUTROABS 8.5*  --  7.1 7.2  --  6.3 6.0  HGB 10.7*   < > 9.5* 10.3* 10.4* 10.4* 10.0*  HCT 35.8*   < > 31.1* 33.4* 33.6* 34.1* 32.0*  MCV 92.3   < > 91.2 92.8 89.8 90.5 92.0  PLT 329   < > 306 305 276 300 278   < > = values in this interval not displayed.   Basic Metabolic Panel: Recent Labs  Lab 10/11/19 1143 10/12/19 0712 10/14/19 0601 10/14/19 1804 10/16/19 0741  NA 139 138 138 139 136  K 4.2 3.7 3.6 3.1* 3.0*  CL 96* 100 102 96* 98  CO2 27 26 24  32 26   GLUCOSE 126* 97 78 93 95  BUN 22 25* 28* 7* 15  CREATININE 3.66* 3.87* 4.54* 2.38* 3.96*  CALCIUM 8.8* 8.2* 8.2* 8.2* 8.0*  PHOS  --   --   --  2.1* 3.8   GFR: Estimated Creatinine Clearance: 14.3 mL/min (A) (by C-G formula based on SCr of 3.96 mg/dL (H)). Liver Function Tests: Recent Labs  Lab 10/11/19 1143 10/12/19 0712 10/14/19 1804 10/16/19 0741  AST 23 14*  --   --   ALT 13 8  --   --   ALKPHOS 112 93  --   --   BILITOT 0.4 0.2*  --   --   PROT 8.3* 6.6  --   --   ALBUMIN 2.9* 2.4* 2.3* 2.2*   Recent Labs  Lab 10/11/19  1143  LIPASE 50   No results for input(s): AMMONIA in the last 168 hours. Coagulation Profile: No results for input(s): INR, PROTIME in the last 168 hours. Cardiac Enzymes: No results for input(s): CKTOTAL, CKMB, CKMBINDEX, TROPONINI in the last 168 hours. BNP (last 3 results) No results for input(s): PROBNP in the last 8760 hours. HbA1C: No results for input(s): HGBA1C in the last 72 hours. CBG: Recent Labs  Lab 10/15/19 0539 10/15/19 1116 10/15/19 1638 10/15/19 2155 10/16/19 0644  GLUCAP 92 100* 84 116* 93   Lipid Profile: No results for input(s): CHOL, HDL, LDLCALC, TRIG, CHOLHDL, LDLDIRECT in the last 72 hours. Thyroid Function Tests: No results for input(s): TSH, T4TOTAL, FREET4, T3FREE, THYROIDAB in the last 72 hours. Anemia Panel: No results for input(s): VITAMINB12, FOLATE, FERRITIN, TIBC, IRON, RETICCTPCT in the last 72 hours. Sepsis Labs: Recent Labs  Lab 10/11/19 1143 10/11/19 1550  LATICACIDVEN 4.0* 1.0    Recent Results (from the past 240 hour(s))  Blood culture (routine x 2)     Status: None   Collection Time: 10/11/19 12:30 PM   Specimen: BLOOD RIGHT ARM  Result Value Ref Range Status   Specimen Description BLOOD RIGHT ARM  Final   Special Requests   Final    BOTTLES DRAWN AEROBIC AND ANAEROBIC Blood Culture results may not be optimal due to an inadequate volume of blood received in culture bottles   Culture    Final    NO GROWTH 5 DAYS Performed at [facility], [place] [address] [place], [place] [address]    Report Status 10/16/2019 FINAL  Final  Blood culture (routine x 2)     Status: None   Collection Time: 10/11/19 12:45 PM   Specimen: BLOOD  Result Value Ref Range Status   Specimen Description BLOOD LEFT ANTECUBITAL  Final   Special Requests   Final    BOTTLES DRAWN AEROBIC AND ANAEROBIC Blood Culture adequate volume   Culture   Final    NO GROWTH 5 DAYS Performed at [facility], [place] [address] [place], [place] [address]    Report Status 10/16/2019 FINAL  Final  Resp Panel by RT PCR (RSV, Flu A&B, Covid) - Nasopharyngeal Swab     Status: None   Collection Time: 10/11/19  4:00 PM   Specimen: Nasopharyngeal Swab  Result Value Ref Range Status   SARS Coronavirus 2 by RT PCR NEGATIVE NEGATIVE Final    Comment: (NOTE) SARS-CoV-2 target nucleic acids are NOT DETECTED.  The SARS-CoV-2 RNA is generally detectable in upper respiratoy specimens during the acute phase of infection. The lowest concentration of SARS-CoV-2 viral copies this assay can detect is 131 copies/mL. A negative result does not preclude SARS-Cov-2 infection and should not be used as the sole basis for treatment or other patient management decisions. A negative result may occur with  improper specimen collection/handling, submission of specimen other than nasopharyngeal swab, presence of viral mutation(s) within the areas targeted by this assay, and inadequate number of viral copies (<131 copies/mL). A negative result must be combined with clinical observations, patient history, and epidemiological information. The expected result is Negative.  Fact Sheet for Patients:  PinkCheek.be  Fact Sheet for Healthcare Providers:  GravelBags.it  This test is no t yet approved or cleared by the [place] FDA and  has been authorized for detection and/or  diagnosis of SARS-CoV-2 by FDA under an Emergency Use Authorization (EUA). This EUA will remain  in effect (meaning this test  can be used) for the duration of the COVID-19 declaration under Section 564(b)(1) of the Act, 21 U.S.C. section 360bbb-3(b)(1), unless the authorization is terminated or revoked sooner.     Influenza A by PCR NEGATIVE NEGATIVE Final   Influenza B by PCR NEGATIVE NEGATIVE Final    Comment: (NOTE) The Xpert Xpress SARS-CoV-2/FLU/RSV assay is intended as an aid in  the diagnosis of influenza from Nasopharyngeal swab specimens and  should not be used as a sole basis for treatment. Nasal washings and  aspirates are unacceptable for Xpert Xpress SARS-CoV-2/FLU/RSV  testing.  Fact Sheet for Patients: PinkCheek.be  Fact Sheet for Healthcare Providers: GravelBags.it  This test is not yet approved or cleared by the [place] FDA and  has been authorized for detection and/or diagnosis of SARS-CoV-2 by  FDA under an Emergency Use Authorization (EUA). This EUA will remain  in effect (meaning this test can be used) for the duration of the  Covid-19 declaration under Section 564(b)(1) of the Act, 21  U.S.C. section 360bbb-3(b)(1), unless the authorization is  terminated or revoked.    Respiratory Syncytial Virus by PCR NEGATIVE NEGATIVE Final    Comment: (NOTE) Fact Sheet for Patients: PinkCheek.be  Fact Sheet for Healthcare Providers: GravelBags.it  This test is not yet approved or cleared by the [place] FDA and  has been authorized for detection and/or diagnosis of SARS-CoV-2 by  FDA under an Emergency Use Authorization (EUA). This EUA will remain  in effect (meaning this test can be used) for the duration of the  COVID-19 declaration under Section 564(b)(1) of the Act, 21 U.S.C.  section 360bbb-3(b)(1), unless the authorization is  terminated or  revoked. Performed at [facility], [place] [address] [place], [place] [address]          Radiology Studies: DG Abd 1 View  Result Date: 10/15/2019 CLINICAL DATA:  Abdominal pain. EXAM: ABDOMEN - 1 VIEW COMPARISON:  None. FINDINGS: Gas is demonstrated within nondilated loops of large and small bowel in a nonobstructed pattern. Contrast within the rectum. Supine evaluation limited for the detection of free intraperitoneal air. Lumbar spine degenerative changes. IMPRESSION: Nonobstructed bowel gas pattern. Electronically Signed   By: Lovey Newcomer M.D.   On: 10/15/2019 15:48   VAS [place] LOWER EXTREMITY ARTERIAL DUPLEX  Result Date: 10/15/2019 LOWER EXTREMITY ARTERIAL DUPLEX STUDY Indications: Gangrene, and peripheral artery disease. High Risk Factors: Hypertension, hyperlipidemia, Diabetes, past history of                    smoking: 42 pack year history.  Current ABI: Not obtained Comparison Study: 09/15/2019- lower extremity arterial duplex Performing Technologist: Maudry Mayhew MHA, RDMS, RVT, RDCS  Examination Guidelines: A complete evaluation includes B-mode imaging, spectral Doppler, color Doppler, and power Doppler as needed of all accessible portions of each vessel. Bilateral testing is considered an integral part of a complete examination. Limited examinations for reoccurring indications may be performed as noted.  +-----------+--------+-----+---------------+----------+--------+ LEFT       PSV cm/sRatioStenosis       Waveform  Comments +-----------+--------+-----+---------------+----------+--------+ CFA Distal 81                          monophasic         +-----------+--------+-----+---------------+----------+--------+ DFA        82  monophasic         +-----------+--------+-----+---------------+----------+--------+ SFA Prox   180          30-49% stenosismonophasic          +-----------+--------+-----+---------------+----------+--------+ SFA Mid    118                         monophasic         +-----------+--------+-----+---------------+----------+--------+ SFA Distal 90                          monophasic         +-----------+--------+-----+---------------+----------+--------+ POP Prox   64                          monophasic         +-----------+--------+-----+---------------+----------+--------+ POP Distal 63                          monophasic         +-----------+--------+-----+---------------+----------+--------+ TP Trunk   104                         monophasic         +-----------+--------+-----+---------------+----------+--------+ ATA Prox   15                          monophasic         +-----------+--------+-----+---------------+----------+--------+ ATA Mid    33                          monophasic         +-----------+--------+-----+---------------+----------+--------+ ATA Distal 42                          monophasic         +-----------+--------+-----+---------------+----------+--------+ PTA Prox   57                          monophasic         +-----------+--------+-----+---------------+----------+--------+ PTA Mid    57                          monophasic         +-----------+--------+-----+---------------+----------+--------+ PTA Distal 53                          monophasic         +-----------+--------+-----+---------------+----------+--------+ PERO Prox  34                          monophasic         +-----------+--------+-----+---------------+----------+--------+ PERO Mid   20                          monophasic         +-----------+--------+-----+---------------+----------+--------+ PERO Distal45                          monophasic         +-----------+--------+-----+---------------+----------+--------+ DP         49  monophasic          +-----------+--------+-----+---------------+----------+--------+  Summary: Left: 30-49% stenosis noted in the superficial femoral artery. Patent left popliteal artery stent. Three vessel monophasic runoff.  See table(s) above for measurements and observations. Electronically signed by Servando Snare MD on 10/15/2019 at 11:32:44 AM.    Final         Scheduled Meds: . aspirin EC  81 mg Oral Daily  . atorvastatin  80 mg Oral Q2000  . carvedilol  25 mg Oral BID WC  . Chlorhexidine Gluconate Cloth  6 each Topical Daily  . Chlorhexidine Gluconate Cloth  6 each Topical Q0600  . heparin  5,000 Units Subcutaneous Q8H  . hydrALAZINE  50 mg Oral TID  . insulin aspart  0-5 Units Subcutaneous QHS  . insulin aspart  0-9 Units Subcutaneous TID WC  . isosorbide mononitrate  60 mg Oral Daily  . ketorolac  30 mg Intravenous Once  . lactulose  10 g Oral TID  . linaclotide  145 mcg Oral QAC breakfast  . ranolazine  500 mg Oral BID  . sevelamer carbonate  800 mg Oral TID WC   Continuous Infusions: . sodium chloride    . sodium chloride    . cefTRIAXone (ROCEPHIN)  IV 2 g (10/15/19 1234)  . metronidazole 500 mg (10/16/19 0530)  . vancomycin 750 mg (10/16/19 0945)     LOS: 5 days    Time spent: 30 minutes     Barb Merino, MD [facility] Pager [phone]

## 2019-10-16 NOTE — Progress Notes (Signed)
°   10/16/19 1500  Clinical Encounter Type  Visited With Patient  Visit Type Initial;Spiritual support;Social support  Referral From Family  Consult/Referral To Chaplain  Spiritual Encounters  Spiritual Needs Prayer;Emotional;Sacred text  The chaplain met with the patient to provide emotional an social support. The chaplain provided the patient with active listening about her current condition and potential surgery this week. The chaplain spoke to the patient about the sacred text (bible) and offered prayer while at bedside. The patient was appreciative of a visit and was able to speak with a concerned neighbor on the phone. The chaplain will follow up with the patient as needed. The chaplain also spoke about the floor chaplain Faustino Congress) as she may follow up when this chaplain is not available.

## 2019-10-16 NOTE — Progress Notes (Signed)
[facility] ROUNDING NOTE   Subjective:   Brief history: 59 year old African-American  Lady with a history of hypertension hyperlipidemia end-stage renal disease Monday Wednesday Friday dialysis through cardiology status post stent placement chronic diastolic dysfunction diabetes mellitus and peripheral artery disease status post transmetatarsal amputation left foot in 2017.  Was admitted with osteomyelitis of left foot.  She is being followed by orthopedics.  Last dialysis 10/14/2019 with 2 L removed next dialysis treatment will be 10/16/2019  Blood pressure 146/64 pulse 86 temperature 98.2 O2 sats 98%  Hemoglobin 10 sodium 139 potassium 3.1 chloride 96 CO2 32 creatinine 2.38 BUN 7 calcium 8.2 phosphorus 2.1 albumin 2.3  Aspirin 81 mg daily Lipitor 80 mg daily Coreg 25 mg twice daily, hydralazine 50 mg 3 times daily insulin sliding scale isosorbide 60 mg daily, Linzess 145 mg daily, Ranexa 500 mg twice daily Renvela 800 mg with meals  IV Rocephin 2 g every 24 hours IV Flagyl 500 mg every 8 hours IV vancomycin     Objective:  Vital signs in last 24 hours:  Temp:  [98.2 F (36.8 C)-98.8 F (37.1 C)] 98.2 F (36.8 C) (10/11 0520) Pulse Rate:  [70-78] 76 (10/11 0520) Resp:  [12-19] 12 (10/11 0520) BP: (108-158)/(36-64) 146/64 (10/11 0520) SpO2:  [96 %-98 %] 96 % (10/11 0520)  Weight change:  Filed Weights   10/11/19 1802 10/14/19 0737 10/14/19 1150  Weight: 74 kg 72 kg 70 kg    Intake/Output: I/O last 3 completed shifts: In: 360 [P.O.:360] Out: 550 [Emesis/NG output:550]   Intake/Output this shift:  No intake/output data recorded.  General:WNWD woman in bed, nad Head:NCAT sclera not icteric MMM Neck: Supple. No JVD No masses Lungs: CTA bilaterally  Heart:RRR with S1 S2 Abdomen: soft NT + BS Lower ext:L TMA with draining ulcer, foul odor, trace edema Neuro: A &O X 3. Moves all extremities spontaneously. Dialysis Access:LUE AVF +bruit   Basic  Metabolic Panel: Recent Labs  Lab 10/09/19 0717 10/09/19 0717 10/11/19 1143 10/11/19 1143 10/12/19 0712 10/14/19 0601 10/14/19 1804  NA 134*  --  139  --  138 138 139  K 3.9  --  4.2  --  3.7 3.6 3.1*  CL 97*  --  96*  --  100 102 96*  CO2 22  --  27  --  26 24 32  GLUCOSE 103*  --  126*  --  97 78 93  BUN 51*  --  22  --  25* 28* 7*  CREATININE 6.53*  --  3.66*  --  3.87* 4.54* 2.38*  CALCIUM 8.8*   < > 8.8*   < > 8.2* 8.2* 8.2*  PHOS  --   --   --   --   --   --  2.1*   < > = values in this interval not displayed.    Liver Function Tests: Recent Labs  Lab 10/11/19 1143 10/12/19 0712 10/14/19 1804  AST 23 14*  --   ALT 13 8  --   ALKPHOS 112 93  --   BILITOT 0.4 0.2*  --   PROT 8.3* 6.6  --   ALBUMIN 2.9* 2.4* 2.3*   Recent Labs  Lab 10/11/19 1143  LIPASE 50   No results for input(s): AMMONIA in the last 168 hours.  CBC: Recent Labs  Lab 10/12/19 0712 10/12/19 0712 10/13/19 0038 10/14/19 0601 10/14/19 1450 10/15/19 0122 10/16/19 0227  WBC 12.1*   < > 10.1 10.1 10.0 9.7 9.4  NEUTROABS 8.5*  --  7.1 7.2  --  6.3 6.0  HGB 10.7*   < > 9.5* 10.3* 10.4* 10.4* 10.0*  HCT 35.8*   < > 31.1* 33.4* 33.6* 34.1* 32.0*  MCV 92.3   < > 91.2 92.8 89.8 90.5 92.0  PLT 329   < > 306 305 276 300 278   < > = values in this interval not displayed.    Cardiac Enzymes: No results for input(s): CKTOTAL, CKMB, CKMBINDEX, TROPONINI in the last 168 hours.  BNP: Invalid input(s): POCBNP  CBG: Recent Labs  Lab 10/15/19 0539 10/15/19 1116 10/15/19 1638 10/15/19 2155 10/16/19 0644  GLUCAP 92 100* 84 116* 93    Microbiology: Results for orders placed or performed during the hospital encounter of 10/11/19  Blood culture (routine x 2)     Status: None (Preliminary result)   Collection Time: 10/11/19 12:30 PM   Specimen: BLOOD RIGHT ARM  Result Value Ref Range Status   Specimen Description BLOOD RIGHT ARM  Final   Special Requests   Final    BOTTLES DRAWN AEROBIC  AND ANAEROBIC Blood Culture results may not be optimal due to an inadequate volume of blood received in culture bottles   Culture   Final    NO GROWTH 4 DAYS Performed at [facility], [place] [address], [place], [place] [address]    Report Status PENDING  Incomplete  Blood culture (routine x 2)     Status: None (Preliminary result)   Collection Time: 10/11/19 12:45 PM   Specimen: BLOOD  Result Value Ref Range Status   Specimen Description BLOOD LEFT ANTECUBITAL  Final   Special Requests   Final    BOTTLES DRAWN AEROBIC AND ANAEROBIC Blood Culture adequate volume   Culture   Final    NO GROWTH 4 DAYS Performed at [facility], [place] [address], [place], [place] [address]    Report Status PENDING  Incomplete  Resp Panel by RT PCR (RSV, Flu A&B, Covid) - Nasopharyngeal Swab     Status: None   Collection Time: 10/11/19  4:00 PM   Specimen: Nasopharyngeal Swab  Result Value Ref Range Status   SARS Coronavirus 2 by RT PCR NEGATIVE NEGATIVE Final    Comment: (NOTE) SARS-CoV-2 target nucleic acids are NOT DETECTED.  The SARS-CoV-2 RNA is generally detectable in upper respiratoy specimens during the acute phase of infection. The lowest concentration of SARS-CoV-2 viral copies this assay can detect is 131 copies/mL. A negative result does not preclude SARS-Cov-2 infection and should not be used as the sole basis for treatment or other patient management decisions. A negative result may occur with  improper specimen collection/handling, submission of specimen other than nasopharyngeal swab, presence of viral mutation(s) within the areas targeted by this assay, and inadequate number of viral copies (<131 copies/mL). A negative result must be combined with clinical observations, patient history, and epidemiological information. The expected result is Negative.  Fact Sheet for Patients:  PinkCheek.be  Fact Sheet for Healthcare Providers:   GravelBags.it  This test is no t yet approved or cleared by the [place] FDA and  has been authorized for detection and/or diagnosis of SARS-CoV-2 by FDA under an Emergency Use Authorization (EUA). This EUA will remain  in effect (meaning this test can be used) for the duration of the COVID-19 declaration under Section 564(b)(1) of the Act, 21 U.S.C. section 360bbb-3(b)(1), unless the authorization is terminated or revoked sooner.     Influenza  A by PCR NEGATIVE NEGATIVE Final   Influenza B by PCR NEGATIVE NEGATIVE Final    Comment: (NOTE) The Xpert Xpress SARS-CoV-2/FLU/RSV assay is intended as an aid in  the diagnosis of influenza from Nasopharyngeal swab specimens and  should not be used as a sole basis for treatment. Nasal washings and  aspirates are unacceptable for Xpert Xpress SARS-CoV-2/FLU/RSV  testing.  Fact Sheet for Patients: PinkCheek.be  Fact Sheet for Healthcare Providers: GravelBags.it  This test is not yet approved or cleared by the [place] FDA and  has been authorized for detection and/or diagnosis of SARS-CoV-2 by  FDA under an Emergency Use Authorization (EUA). This EUA will remain  in effect (meaning this test can be used) for the duration of the  Covid-19 declaration under Section 564(b)(1) of the Act, 21  U.S.C. section 360bbb-3(b)(1), unless the authorization is  terminated or revoked.    Respiratory Syncytial Virus by PCR NEGATIVE NEGATIVE Final    Comment: (NOTE) Fact Sheet for Patients: PinkCheek.be  Fact Sheet for Healthcare Providers: GravelBags.it  This test is not yet approved or cleared by the [place] FDA and  has been authorized for detection and/or diagnosis of SARS-CoV-2 by  FDA under an Emergency Use Authorization (EUA). This EUA will remain  in effect (meaning this test can be  used) for the duration of the  COVID-19 declaration under Section 564(b)(1) of the Act, 21 U.S.C.  section 360bbb-3(b)(1), unless the authorization is terminated or  revoked. Performed at [facility], [place] [address], [place], [place] [address]     Coagulation Studies: No results for input(s): LABPROT, INR in the last 72 hours.  Urinalysis: No results for input(s): COLORURINE, LABSPEC, PHURINE, GLUCOSEU, HGBUR, BILIRUBINUR, KETONESUR, PROTEINUR, UROBILINOGEN, NITRITE, LEUKOCYTESUR in the last 72 hours.  Invalid input(s): APPERANCEUR    Imaging: DG Abd 1 View  Result Date: 10/15/2019 CLINICAL DATA:  Abdominal pain. EXAM: ABDOMEN - 1 VIEW COMPARISON:  None. FINDINGS: Gas is demonstrated within nondilated loops of large and small bowel in a nonobstructed pattern. Contrast within the rectum. Supine evaluation limited for the detection of free intraperitoneal air. Lumbar spine degenerative changes. IMPRESSION: Nonobstructed bowel gas pattern. Electronically Signed   By: Lovey Newcomer M.D.   On: 10/15/2019 15:48   VAS [place] LOWER EXTREMITY ARTERIAL DUPLEX  Result Date: 10/15/2019 LOWER EXTREMITY ARTERIAL DUPLEX STUDY Indications: Gangrene, and peripheral artery disease. High Risk Factors: Hypertension, hyperlipidemia, Diabetes, past history of                    smoking: 42 pack year history.  Current ABI: Not obtained Comparison Study: 09/15/2019- lower extremity arterial duplex Performing Technologist: Maudry Mayhew MHA, RDMS, RVT, RDCS  Examination Guidelines: A complete evaluation includes B-mode imaging, spectral Doppler, color Doppler, and power Doppler as needed of all accessible portions of each vessel. Bilateral testing is considered an integral part of a complete examination. Limited examinations for reoccurring indications may be performed as noted.  +-----------+--------+-----+---------------+----------+--------+ LEFT       PSV cm/sRatioStenosis       Waveform  Comments  +-----------+--------+-----+---------------+----------+--------+ CFA Distal 81                          monophasic         +-----------+--------+-----+---------------+----------+--------+ DFA        82  monophasic         +-----------+--------+-----+---------------+----------+--------+ SFA Prox   180          30-49% stenosismonophasic         +-----------+--------+-----+---------------+----------+--------+ SFA Mid    118                         monophasic         +-----------+--------+-----+---------------+----------+--------+ SFA Distal 90                          monophasic         +-----------+--------+-----+---------------+----------+--------+ POP Prox   64                          monophasic         +-----------+--------+-----+---------------+----------+--------+ POP Distal 63                          monophasic         +-----------+--------+-----+---------------+----------+--------+ TP Trunk   104                         monophasic         +-----------+--------+-----+---------------+----------+--------+ ATA Prox   15                          monophasic         +-----------+--------+-----+---------------+----------+--------+ ATA Mid    33                          monophasic         +-----------+--------+-----+---------------+----------+--------+ ATA Distal 42                          monophasic         +-----------+--------+-----+---------------+----------+--------+ PTA Prox   57                          monophasic         +-----------+--------+-----+---------------+----------+--------+ PTA Mid    57                          monophasic         +-----------+--------+-----+---------------+----------+--------+ PTA Distal 53                          monophasic         +-----------+--------+-----+---------------+----------+--------+ PERO Prox  34                          monophasic          +-----------+--------+-----+---------------+----------+--------+ PERO Mid   20                          monophasic         +-----------+--------+-----+---------------+----------+--------+ PERO Distal45                          monophasic         +-----------+--------+-----+---------------+----------+--------+ DP         49  monophasic         +-----------+--------+-----+---------------+----------+--------+  Summary: Left: 30-49% stenosis noted in the superficial femoral artery. Patent left popliteal artery stent. Three vessel monophasic runoff.  See table(s) above for measurements and observations. Electronically signed by Servando Snare MD on 10/15/2019 at 11:32:44 AM.    Final      Medications:   . sodium chloride    . sodium chloride    . cefTRIAXone (ROCEPHIN)  IV 2 g (10/15/19 1234)  . metronidazole 500 mg (10/16/19 0530)  . vancomycin     . aspirin EC  81 mg Oral Daily  . atorvastatin  80 mg Oral Q2000  . carvedilol  25 mg Oral BID WC  . Chlorhexidine Gluconate Cloth  6 each Topical Daily  . Chlorhexidine Gluconate Cloth  6 each Topical Q0600  . heparin  5,000 Units Subcutaneous Q8H  . hydrALAZINE  50 mg Oral TID  . insulin aspart  0-5 Units Subcutaneous QHS  . insulin aspart  0-9 Units Subcutaneous TID WC  . isosorbide mononitrate  60 mg Oral Daily  . ketorolac  30 mg Intravenous Once  . lactulose  10 g Oral TID  . linaclotide  145 mcg Oral QAC breakfast  . ranolazine  500 mg Oral BID  . sevelamer carbonate  800 mg Oral TID WC   sodium chloride, sodium chloride, acetaminophen, alteplase, heparin, HYDROmorphone (DILAUDID) injection, lidocaine (PF), lidocaine-prilocaine, ondansetron (ZOFRAN) IV, pentafluoroprop-tetrafluoroeth, polyethylene glycol  Assessment/ Plan:  1. Left foot osteomyelitis - s/p L transmet amp 2017. Ortho following, will likely need BKA. IV antibiotics per primary.seen by [facility] who are also recommending BKA or AKA.   2. ESRD -HD MWF. HD 10/16/2019 3. Rectal pain /Constipation - per primary. S/p disimpaction in ED 10/4.Seems to have resolved.  4. Hypertension/volume - BP elevated. Continue home meds. Below EDW by 6kg here.  Prob euvolemic now.  5. Anemia - Hgb 10.7 No ESA needs. Follow trends. 6. Metabolic bone disease -Ca ok. Continue Hectorol Sensipar Velphoro binder 7. Nutrition -Renal diet/prot supp for low albumin  8. DMT2 - insulin per primary     LOS: [place] @TODAY @7 :03 AM

## 2019-10-16 NOTE — Progress Notes (Signed)
Pt to HD

## 2019-10-16 NOTE — Progress Notes (Addendum)
Patient ID: Rebekah Johnson, female   DOB: Jun 05, 1960, 59 y.o.   MRN: [id number] Patient is seen in follow-up for progressive ischemic changes left foot status post midfoot amputation for foot salvage intervention after revascularization.  Patient has had progressive decreased circulation to her foot vascular surgery does not feel that she has any further revascularization options and her best option at this time is a transtibial amputation.  Discussed that this may have difficulty healing due to her extensive peripheral vascular disease.  Discussed that I could proceed with a left transtibial amputation on Wednesday.  Patient states she wants to proceed with surgery as soon as possible. I discussed this with Dr. Carlis Abbott and vascular surgery would not be able to proceed with surgery before Wednesday.  I will plan for left transtibial amputation Wednesday.

## 2019-10-17 DIAGNOSIS — M86179 Other acute osteomyelitis, unspecified ankle and foot: Secondary | ICD-10-CM | POA: Diagnosis not present

## 2019-10-17 LAB — RENAL FUNCTION PANEL
Albumin: 2.2 g/dL — ABNORMAL LOW (ref 3.5–5.0)
Anion gap: 12 (ref 5–15)
BUN: 15 mg/dL (ref 8–23)
CO2: 26 mmol/L (ref 22–32)
Calcium: 8 mg/dL — ABNORMAL LOW (ref 8.9–10.3)
Chloride: 98 mmol/L (ref 98–111)
Creatinine, Ser: 3.96 mg/dL — ABNORMAL HIGH (ref 0.44–1.00)
GFR, Estimated: 12 mL/min — ABNORMAL LOW (ref >60)
Glucose, Bld: 95 mg/dL (ref 70–99)
Phosphorus: 3.8 mg/dL (ref 2.5–4.6)
Potassium: 3 mmol/L — ABNORMAL LOW (ref 3.5–5.1)
Sodium: 136 mmol/L (ref 135–145)

## 2019-10-17 LAB — GLUCOSE, CAPILLARY
Glucose-Capillary: 106 mg/dL — ABNORMAL HIGH (ref 70–99)
Glucose-Capillary: 129 mg/dL — ABNORMAL HIGH (ref 70–99)
Glucose-Capillary: 131 mg/dL — ABNORMAL HIGH (ref 70–99)
Glucose-Capillary: 104 mg/dL — ABNORMAL HIGH (ref 70–99)
Glucose-Capillary: 118 mg/dL — ABNORMAL HIGH (ref 70–99)

## 2019-10-17 MED ORDER — CHLORHEXIDINE GLUCONATE CLOTH 2 % EX PADS: 6.0000 | MEDICATED_PAD | Freq: Every day | CUTANEOUS | Status: DC

## 2019-10-17 MED ORDER — FAMOTIDINE 20 MG PO TABS
20.0000 mg | ORAL_TABLET | Freq: Every day | ORAL | Status: DC
  Administered 2019-10-17 – 2019-10-19 (×2): 20 mg via ORAL
  Filled 2019-10-17 (×2): qty 1

## 2019-10-17 MED ORDER — CEFAZOLIN SODIUM-DEXTROSE 2-4 GM/100ML-% IV SOLN
2.0000 g | INTRAVENOUS | Status: AC
  Administered 2019-10-18: 2 g via INTRAVENOUS
  Filled 2019-10-17: qty 100

## 2019-10-17 MED ORDER — ONDANSETRON HCL 4 MG/2ML IJ SOLN
4.0000 mg | Freq: Four times a day (QID) | INTRAMUSCULAR | Status: DC | PRN
  Administered 2019-10-17 – 2019-10-18 (×3): 4 mg via INTRAVENOUS
  Filled 2019-10-17 (×3): qty 2

## 2019-10-17 MED ORDER — CALCIUM CARBONATE ANTACID 500 MG PO CHEW
1.0000 | CHEWABLE_TABLET | Freq: Three times a day (TID) | ORAL | Status: DC | PRN
  Filled 2019-10-17: qty 1

## 2019-10-17 NOTE — Progress Notes (Signed)
[facility] ROUNDING NOTE   Subjective:   Brief history: 59 year old African-American  Lady with a history of hypertension hyperlipidemia end-stage renal disease Monday Wednesday Friday dialysis through cardiology status post stent placement chronic diastolic dysfunction diabetes mellitus and peripheral artery disease status post transmetatarsal amputation left foot in 2017.  Was admitted with osteomyelitis of left foot.  She is being followed by orthopedics.  Last dialysis 10/16/2019 with 2.5 L removed next dialysis will be 10/18/2019  Blood pressure 104 55 pulse 78 temperature 98.4 O2 sats 96% room air  Sodium 136 potassium 3 chloride 98 CO2 26 BUN 15 creatinine 3.86 glucose 95 calcium 9 hemoglobin 10  Aspirin 81 mg daily Lipitor 80 mg daily Coreg 25 mg twice daily, hydralazine 50 mg 3 times daily insulin sliding scale isosorbide 60 mg daily, Linzess 145 mg daily, Ranexa 500 mg twice daily Renvela 800 mg with meals  IV Rocephin 2 g every 24 hours IV Flagyl 500 mg every 8 hours IV vancomycin     Objective:  Vital signs in last 24 hours:  Temp:  [97.9 F (36.6 C)-98.9 F (37.2 C)] 98.4 F (36.9 C) (10/12 0452) Pulse Rate:  [71-84] 75 (10/12 0452) Resp:  [0-18] 13 (10/12 0452) BP: (104-180)/(47-98) 104/55 (10/12 0452) SpO2:  [95 %-100 %] 95 % (10/12 0452) Weight:  [67.2 kg] 67.2 kg (10/11 1112)  Weight change:  Filed Weights   10/14/19 1150 10/16/19 0730 10/16/19 1112  Weight: 70 kg 69.8 kg 67.2 kg    Intake/Output: I/O last 3 completed shifts: In: 1829 [P.O.:720; IV Piggyback:350] Out: 2500 [Other:2500]   Intake/Output this shift:  No intake/output data recorded.  General:WNWD woman in bed, nad Head:NCAT sclera not icteric MMM Neck: Supple. No JVD No masses Lungs: CTA bilaterally  Heart:RRR with S1 S2 Abdomen: soft NT + BS Lower ext:L TMA with draining ulcer, foul odor, trace edema Neuro: A &O X 3. Moves all extremities  spontaneously. Dialysis Access:LUE AVF +bruit   Basic Metabolic Panel: Recent Labs  Lab 10/11/19 1143 10/11/19 1143 10/12/19 0712 10/12/19 0712 10/14/19 0601 10/14/19 1804 10/16/19 0741  NA 139  --  138  --  138 139 136  K 4.2  --  3.7  --  3.6 3.1* 3.0*  CL 96*  --  100  --  102 96* 98  CO2 27  --  26  --  24 32 26  GLUCOSE 126*  --  97  --  78 93 95  BUN 22  --  25*  --  28* 7* 15  CREATININE 3.66*  --  3.87*  --  4.54* 2.38* 3.96*  CALCIUM 8.8*   < > 8.2*   < > 8.2* 8.2* 8.0*  PHOS  --   --   --   --   --  2.1* 3.8   < > = values in this interval not displayed.    Liver Function Tests: Recent Labs  Lab 10/11/19 1143 10/12/19 0712 10/14/19 1804 10/16/19 0741  AST 23 14*  --   --   ALT 13 8  --   --   ALKPHOS 112 93  --   --   BILITOT 0.4 0.2*  --   --   PROT 8.3* 6.6  --   --   ALBUMIN 2.9* 2.4* 2.3* 2.2*   Recent Labs  Lab 10/11/19 1143  LIPASE 50   No results for input(s): AMMONIA in the last 168 hours.  CBC: Recent Labs  Lab 10/12/19  3382 10/12/19 5053 10/13/19 0038 10/14/19 0601 10/14/19 1450 10/15/19 0122 10/16/19 0227  WBC 12.1*   < > 10.1 10.1 10.0 9.7 9.4  NEUTROABS 8.5*  --  7.1 7.2  --  6.3 6.0  HGB 10.7*   < > 9.5* 10.3* 10.4* 10.4* 10.0*  HCT 35.8*   < > 31.1* 33.4* 33.6* 34.1* 32.0*  MCV 92.3   < > 91.2 92.8 89.8 90.5 92.0  PLT 329   < > 306 305 276 300 278   < > = values in this interval not displayed.    Cardiac Enzymes: No results for input(s): CKTOTAL, CKMB, CKMBINDEX, TROPONINI in the last 168 hours.  BNP: Invalid input(s): POCBNP  CBG: Recent Labs  Lab 10/16/19 0644 10/16/19 1236 10/16/19 1643 10/16/19 2150 10/17/19 0611  GLUCAP 93 80 192* 112* 106*    Microbiology: Results for orders placed or performed during the hospital encounter of 10/11/19  Blood culture (routine x 2)     Status: None   Collection Time: 10/11/19 12:30 PM   Specimen: BLOOD RIGHT ARM  Result Value Ref Range Status   Specimen  Description BLOOD RIGHT ARM  Final   Special Requests   Final    BOTTLES DRAWN AEROBIC AND ANAEROBIC Blood Culture results may not be optimal due to an inadequate volume of blood received in culture bottles   Culture   Final    NO GROWTH 5 DAYS Performed at [facility], [place] [address], [place], [place] [address]    Report Status 10/16/2019 FINAL  Final  Blood culture (routine x 2)     Status: None   Collection Time: 10/11/19 12:45 PM   Specimen: BLOOD  Result Value Ref Range Status   Specimen Description BLOOD LEFT ANTECUBITAL  Final   Special Requests   Final    BOTTLES DRAWN AEROBIC AND ANAEROBIC Blood Culture adequate volume   Culture   Final    NO GROWTH 5 DAYS Performed at [facility], [place] [address] [place],  [address]    Report Status 10/16/2019 FINAL  Final  Resp Panel by RT PCR (RSV, Flu A&B, Covid) - Nasopharyngeal Swab     Status: None   Collection Time: 10/11/19  4:00 PM   Specimen: Nasopharyngeal Swab  Result Value Ref Range Status   SARS Coronavirus 2 by RT PCR NEGATIVE NEGATIVE Final    Comment: (NOTE) SARS-CoV-2 target nucleic acids are NOT DETECTED.  The SARS-CoV-2 RNA is generally detectable in upper respiratoy specimens during the acute phase of infection. The lowest concentration of SARS-CoV-2 viral copies this assay can detect is 131 copies/mL. A negative result does not preclude SARS-Cov-2 infection and should not be used as the sole basis for treatment or other patient management decisions. A negative result may occur with  improper specimen collection/handling, submission of specimen other than nasopharyngeal swab, presence of viral mutation(s) within the areas targeted by this assay, and inadequate number of viral copies (<131 copies/mL). A negative result must be combined with clinical observations, patient history, and epidemiological information. The expected result is Negative.  Fact Sheet for Patients:   PinkCheek.be  Fact Sheet for Healthcare Providers:  GravelBags.it  This test is no t yet approved or cleared by the [place] FDA and  has been authorized for detection and/or diagnosis of SARS-CoV-2 by FDA under an Emergency Use Authorization (EUA). This EUA will remain  in effect (meaning this test can be used) for the duration of the COVID-19  declaration under Section 564(b)(1) of the Act, 21 U.S.C. section 360bbb-3(b)(1), unless the authorization is terminated or revoked sooner.     Influenza A by PCR NEGATIVE NEGATIVE Final   Influenza B by PCR NEGATIVE NEGATIVE Final    Comment: (NOTE) The Xpert Xpress SARS-CoV-2/FLU/RSV assay is intended as an aid in  the diagnosis of influenza from Nasopharyngeal swab specimens and  should not be used as a sole basis for treatment. Nasal washings and  aspirates are unacceptable for Xpert Xpress SARS-CoV-2/FLU/RSV  testing.  Fact Sheet for Patients: PinkCheek.be  Fact Sheet for Healthcare Providers: GravelBags.it  This test is not yet approved or cleared by the [place] FDA and  has been authorized for detection and/or diagnosis of SARS-CoV-2 by  FDA under an Emergency Use Authorization (EUA). This EUA will remain  in effect (meaning this test can be used) for the duration of the  Covid-19 declaration under Section 564(b)(1) of the Act, 21  U.S.C. section 360bbb-3(b)(1), unless the authorization is  terminated or revoked.    Respiratory Syncytial Virus by PCR NEGATIVE NEGATIVE Final    Comment: (NOTE) Fact Sheet for Patients: PinkCheek.be  Fact Sheet for Healthcare Providers: GravelBags.it  This test is not yet approved or cleared by the [place] FDA and  has been authorized for detection and/or diagnosis of SARS-CoV-2 by  FDA under an Emergency  Use Authorization (EUA). This EUA will remain  in effect (meaning this test can be used) for the duration of the  COVID-19 declaration under Section 564(b)(1) of the Act, 21 U.S.C.  section 360bbb-3(b)(1), unless the authorization is terminated or  revoked. Performed at [facility], [place] [address], [place], [place] [address]     Coagulation Studies: No results for input(s): LABPROT, INR in the last 72 hours.  Urinalysis: No results for input(s): COLORURINE, LABSPEC, PHURINE, GLUCOSEU, HGBUR, BILIRUBINUR, KETONESUR, PROTEINUR, UROBILINOGEN, NITRITE, LEUKOCYTESUR in the last 72 hours.  Invalid input(s): APPERANCEUR    Imaging: DG Abd 1 View  Result Date: 10/15/2019 CLINICAL DATA:  Abdominal pain. EXAM: ABDOMEN - 1 VIEW COMPARISON:  None. FINDINGS: Gas is demonstrated within nondilated loops of large and small bowel in a nonobstructed pattern. Contrast within the rectum. Supine evaluation limited for the detection of free intraperitoneal air. Lumbar spine degenerative changes. IMPRESSION: Nonobstructed bowel gas pattern. Electronically Signed   By: Lovey Newcomer M.D.   On: 10/15/2019 15:48     Medications:   . cefTRIAXone (ROCEPHIN)  IV 2 g (10/16/19 1236)  . metronidazole 500 mg (10/17/19 0616)  . vancomycin Stopped (10/16/19 1045)   . aspirin EC  81 mg Oral Daily  . atorvastatin  80 mg Oral Q2000  . carvedilol  25 mg Oral BID WC  . Chlorhexidine Gluconate Cloth  6 each Topical Daily  . Chlorhexidine Gluconate Cloth  6 each Topical Q0600  . heparin  5,000 Units Subcutaneous Q8H  . hydrALAZINE  50 mg Oral TID  . insulin aspart  0-5 Units Subcutaneous QHS  . insulin aspart  0-9 Units Subcutaneous TID WC  . isosorbide mononitrate  60 mg Oral Daily  . lactulose  10 g Oral TID  . linaclotide  145 mcg Oral QAC breakfast  . ranolazine  500 mg Oral BID  . sevelamer carbonate  800 mg Oral TID WC   acetaminophen, HYDROmorphone (DILAUDID) injection, ondansetron (ZOFRAN) IV,  polyethylene glycol  Assessment/ Plan:  1. Left foot osteomyelitis - s/p L transmet amp 2017. Ortho following, will likely need BKA.  IV antibiotics per primary.seen by [facility] who are also recommending BKA or AKA.  2. ESRD -HD MWF.  Next hemodialysis will be 10/18/2019 3. Rectal pain /Constipation - per primary. S/p disimpaction in ED 10/4.Seems to have resolved.  4. Hypertension/volume - BP elevated. Continue home meds. Below EDW by 6kg here.  Prob euvolemic now.  5. Anemia - Hgb 10.7 No ESA needs. Follow trends. 6. Metabolic bone disease -Ca ok. Continue Hectorol Sensipar Velphoro binder 7. Nutrition -Renal diet/prot supp for low albumin  8. DMT2 - insulin per primary   LOS: [place] @TODAY @7 :38 AM

## 2019-10-17 NOTE — Progress Notes (Signed)
PROGRESS NOTE    Rebekah Johnson  GXQ:[id number] DOB: Jun 17, 1960 DOA: 10/11/2019 PCP: Patient, No Pcp Per    Brief Narrative:  59 year old female with history of hypertension, hyperlipidemia, ESRD on dialysis Monday Wednesday Friday, coronary artery disease, chronic combined heart failure, diabetes with neuropathy and peripheral vascular disease, left metatarsal foot amputation presented to the hospital with severe left pain and abdominal pain.  Patient has severe peripheral arterial disease.  She is also on pain medication regimen with opiates at home causing severe constipation.  She was disimpacted under sedation and discharged home with bowel regimen on last follow-up. In the emergency room hemodynamically stable.  Patient was found with left foot osteomyelitis, CT abdomen showed proctitis.  Admitted for surgical intervention and now planned for transtibial amputation.   Assessment & Plan:   Active Problems:   Osteomyelitis of foot, acute (HCC)   Non-pressure chronic ulcer of other part of left foot limited to breakdown of skin (HCC)   Pyogenic inflammation of bone (HCC)  Acute osteomyelitis of the left foot present on admission with nonhealing ulcer and severe peripheral arterial disease: Currently remains on broad-spectrum antibiotics, however she will need definitive management.  Followed by orthopedics and vascular surgery.  She is planned for transtibial amputation on 10/13. Continue antibiotics until surgery to the clean margin.  Cultures negative so far.  Rectal pain/opiate induced constipation: On a scheduled laxative with improvement.  Avoid opiate as much possible.  ESRD on hemodialysis: Getting dialysis on her schedule.  Followed by nephrology.  Dyspepsia: We will add Pepcid and Tums as needed.   DVT prophylaxis: heparin injection 5,000 Units Start: 10/11/19 2200 SCDs Start: 10/11/19 1702   Code Status: Full code Family Communication: None, patient is  communicating. Disposition Plan: Status is: Inpatient  Remains inpatient appropriate because:Inpatient level of care appropriate due to severity of illness   Dispo:  Patient From: Home  Planned Disposition: Home with Health Care Svc  Expected discharge date: 10/20/19  Medically stable for discharge: No          Consultants:   Nephrology  Orthopedics  Vascular  Procedures:   Dialysis  Antimicrobials:  Antibiotics Given (last 72 hours)    Date/Time Action Medication Dose Rate   10/14/19 1255 New Bag/Given   cefTRIAXone (ROCEPHIN) 2 g in sodium chloride 0.9 % 100 mL IVPB 2 g 200 mL/hr   10/14/19 1447 New Bag/Given   metroNIDAZOLE (FLAGYL) IVPB 500 mg 500 mg 100 mL/hr   10/14/19 2249 New Bag/Given   metroNIDAZOLE (FLAGYL) IVPB 500 mg 500 mg 100 mL/hr   10/15/19 0615 New Bag/Given   metroNIDAZOLE (FLAGYL) IVPB 500 mg 500 mg 100 mL/hr   10/15/19 1234 New Bag/Given   cefTRIAXone (ROCEPHIN) 2 g in sodium chloride 0.9 % 100 mL IVPB 2 g 200 mL/hr   10/15/19 1440 New Bag/Given   metroNIDAZOLE (FLAGYL) IVPB 500 mg 500 mg 100 mL/hr   10/15/19 2300 New Bag/Given   metroNIDAZOLE (FLAGYL) IVPB 500 mg 500 mg 100 mL/hr   10/16/19 0530 New Bag/Given   metroNIDAZOLE (FLAGYL) IVPB 500 mg 500 mg 100 mL/hr   10/16/19 0945 New Bag/Given   vancomycin (VANCOCIN) IVPB 750 mg/150 ml premix 750 mg 150 mL/hr   10/16/19 1236 New Bag/Given   cefTRIAXone (ROCEPHIN) 2 g in sodium chloride 0.9 % 100 mL IVPB 2 g 200 mL/hr   10/16/19 1552 New Bag/Given   metroNIDAZOLE (FLAGYL) IVPB 500 mg 500 mg 100 mL/hr   10/16/19 2107 New Bag/Given  metroNIDAZOLE (FLAGYL) IVPB 500 mg 500 mg 100 mL/hr   10/17/19 0616 New Bag/Given   metroNIDAZOLE (FLAGYL) IVPB 500 mg 500 mg 100 mL/hr         Subjective: Patient seen and examined.  No overnight events.  She complains of some nausea and epigastric discomfort after eating.  Eager to have surgery tomorrow.  Objective: Vitals:   10/16/19 2025 10/17/19  0055 10/17/19 0452 10/17/19 0759  BP: 115/60 (!) 114/57 (!) 104/55 (!) 144/58  Pulse: 79 82 75 74  Resp: 16 14 13 12   Temp: 98.5 F (36.9 C) 98.9 F (37.2 C) 98.4 F (36.9 C) 98.7 F (37.1 C)  TempSrc: Oral Oral Oral Oral  SpO2: 95% 99% 95% 97%  Weight:      Height:        Intake/Output Summary (Last 24 hours) at 10/17/2019 1124 Last data filed at 10/17/2019 1121 Gross per 24 hour  Intake 1550 ml  Output --  Net 1550 ml   Filed Weights   10/14/19 1150 10/16/19 0730 10/16/19 1112  Weight: 70 kg 69.8 kg 67.2 kg    Examination:  General exam: Appears calm and comfortable, on room air.  Chronically sick looking but not in any distress. Respiratory system: Clear to auscultation. Respiratory effort normal. Cardiovascular system: S1 & S2 heard, RRR. Gastrointestinal system: Abdomen is nondistended, soft and nontender. No organomegaly or masses felt. Normal bowel sounds heard. Central nervous system: Alert and oriented. No focal neurological deficits. Extremities: Symmetric 5 x 5 power. Skin:  Patient has swollen, draining ulcer on the left lower extremity transmetatarsal amputation stump. Patient has left upper extremity AV fistula with thrill.    Data Reviewed: I have personally reviewed following labs and imaging studies  CBC: Recent Labs  Lab 10/12/19 0712 10/12/19 0712 10/13/19 0038 10/14/19 0601 10/14/19 1450 10/15/19 0122 10/16/19 0227  WBC 12.1*   < > 10.1 10.1 10.0 9.7 9.4  NEUTROABS 8.5*  --  7.1 7.2  --  6.3 6.0  HGB 10.7*   < > 9.5* 10.3* 10.4* 10.4* 10.0*  HCT 35.8*   < > 31.1* 33.4* 33.6* 34.1* 32.0*  MCV 92.3   < > 91.2 92.8 89.8 90.5 92.0  PLT 329   < > 306 305 276 300 278   < > = values in this interval not displayed.   Basic Metabolic Panel: Recent Labs  Lab 10/11/19 1143 10/12/19 0712 10/14/19 0601 10/14/19 1804 10/16/19 0741  NA 139 138 138 139 136  K 4.2 3.7 3.6 3.1* 3.0*  CL 96* 100 102 96* 98  CO2 27 26 24  32 26  GLUCOSE 126* 97  78 93 95  BUN 22 25* 28* 7* 15  CREATININE 3.66* 3.87* 4.54* 2.38* 3.96*  CALCIUM 8.8* 8.2* 8.2* 8.2* 8.0*  PHOS  --   --   --  2.1* 3.8   GFR: Estimated Creatinine Clearance: 14.1 mL/min (A) (by C-G formula based on SCr of 3.96 mg/dL (H)). Liver Function Tests: Recent Labs  Lab 10/11/19 1143 10/12/19 0712 10/14/19 1804 10/16/19 0741  AST 23 14*  --   --   ALT 13 8  --   --   ALKPHOS 112 93  --   --   BILITOT 0.4 0.2*  --   --   PROT 8.3* 6.6  --   --   ALBUMIN 2.9* 2.4* 2.3* 2.2*   Recent Labs  Lab 10/11/19 1143  LIPASE 50   No results for input(s): AMMONIA in  the last 168 hours. Coagulation Profile: No results for input(s): INR, PROTIME in the last 168 hours. Cardiac Enzymes: No results for input(s): CKTOTAL, CKMB, CKMBINDEX, TROPONINI in the last 168 hours. BNP (last 3 results) No results for input(s): PROBNP in the last 8760 hours. HbA1C: No results for input(s): HGBA1C in the last 72 hours. CBG: Recent Labs  Lab 10/16/19 1236 10/16/19 1643 10/16/19 2150 10/17/19 0611 10/17/19 0839  GLUCAP 80 192* 112* 106* 129*   Lipid Profile: No results for input(s): CHOL, HDL, LDLCALC, TRIG, CHOLHDL, LDLDIRECT in the last 72 hours. Thyroid Function Tests: No results for input(s): TSH, T4TOTAL, FREET4, T3FREE, THYROIDAB in the last 72 hours. Anemia Panel: No results for input(s): VITAMINB12, FOLATE, FERRITIN, TIBC, IRON, RETICCTPCT in the last 72 hours. Sepsis Labs: Recent Labs  Lab 10/11/19 1143 10/11/19 1550  LATICACIDVEN 4.0* 1.0    Recent Results (from the past 240 hour(s))  Blood culture (routine x 2)     Status: None   Collection Time: 10/11/19 12:30 PM   Specimen: BLOOD RIGHT ARM  Result Value Ref Range Status   Specimen Description BLOOD RIGHT ARM  Final   Special Requests   Final    BOTTLES DRAWN AEROBIC AND ANAEROBIC Blood Culture results may not be optimal due to an inadequate volume of blood received in culture bottles   Culture   Final    NO  GROWTH 5 DAYS Performed at [facility], [place] [address], [place], [place] [address]    Report Status 10/16/2019 FINAL  Final  Blood culture (routine x 2)     Status: None   Collection Time: 10/11/19 12:45 PM   Specimen: BLOOD  Result Value Ref Range Status   Specimen Description BLOOD LEFT ANTECUBITAL  Final   Special Requests   Final    BOTTLES DRAWN AEROBIC AND ANAEROBIC Blood Culture adequate volume   Culture   Final    NO GROWTH 5 DAYS Performed at [facility], [place] [address], [place], [place] [address]    Report Status 10/16/2019 FINAL  Final  Resp Panel by RT PCR (RSV, Flu A&B, Covid) - Nasopharyngeal Swab     Status: None   Collection Time: 10/11/19  4:00 PM   Specimen: Nasopharyngeal Swab  Result Value Ref Range Status   SARS Coronavirus 2 by RT PCR NEGATIVE NEGATIVE Final    Comment: (NOTE) SARS-CoV-2 target nucleic acids are NOT DETECTED.  The SARS-CoV-2 RNA is generally detectable in upper respiratoy specimens during the acute phase of infection. The lowest concentration of SARS-CoV-2 viral copies this assay can detect is 131 copies/mL. A negative result does not preclude SARS-Cov-2 infection and should not be used as the sole basis for treatment or other patient management decisions. A negative result may occur with  improper specimen collection/handling, submission of specimen other than nasopharyngeal swab, presence of viral mutation(s) within the areas targeted by this assay, and inadequate number of viral copies (<131 copies/mL). A negative result must be combined with clinical observations, patient history, and epidemiological information. The expected result is Negative.  Fact Sheet for Patients:  PinkCheek.be  Fact Sheet for Healthcare Providers:  GravelBags.it  This test is no t yet approved or cleared by the [place] FDA and  has been authorized for detection and/or diagnosis of  SARS-CoV-2 by FDA under an Emergency Use Authorization (EUA). This EUA will remain  in effect (meaning this test can be used) for the duration of the COVID-19 declaration under Section  564(b)(1) of the Act, 21 U.S.C. section 360bbb-3(b)(1), unless the authorization is terminated or revoked sooner.     Influenza A by PCR NEGATIVE NEGATIVE Final   Influenza B by PCR NEGATIVE NEGATIVE Final    Comment: (NOTE) The Xpert Xpress SARS-CoV-2/FLU/RSV assay is intended as an aid in  the diagnosis of influenza from Nasopharyngeal swab specimens and  should not be used as a sole basis for treatment. Nasal washings and  aspirates are unacceptable for Xpert Xpress SARS-CoV-2/FLU/RSV  testing.  Fact Sheet for Patients: PinkCheek.be  Fact Sheet for Healthcare Providers: GravelBags.it  This test is not yet approved or cleared by the [place] FDA and  has been authorized for detection and/or diagnosis of SARS-CoV-2 by  FDA under an Emergency Use Authorization (EUA). This EUA will remain  in effect (meaning this test can be used) for the duration of the  Covid-19 declaration under Section 564(b)(1) of the Act, 21  U.S.C. section 360bbb-3(b)(1), unless the authorization is  terminated or revoked.    Respiratory Syncytial Virus by PCR NEGATIVE NEGATIVE Final    Comment: (NOTE) Fact Sheet for Patients: PinkCheek.be  Fact Sheet for Healthcare Providers: GravelBags.it  This test is not yet approved or cleared by the [place] FDA and  has been authorized for detection and/or diagnosis of SARS-CoV-2 by  FDA under an Emergency Use Authorization (EUA). This EUA will remain  in effect (meaning this test can be used) for the duration of the  COVID-19 declaration under Section 564(b)(1) of the Act, 21 U.S.C.  section 360bbb-3(b)(1), unless the authorization is terminated or   revoked. Performed at [facility], [place] [address] [place], [place] [address]          Radiology Studies: DG Abd 1 View  Result Date: 10/15/2019 CLINICAL DATA:  Abdominal pain. EXAM: ABDOMEN - 1 VIEW COMPARISON:  None. FINDINGS: Gas is demonstrated within nondilated loops of large and small bowel in a nonobstructed pattern. Contrast within the rectum. Supine evaluation limited for the detection of free intraperitoneal air. Lumbar spine degenerative changes. IMPRESSION: Nonobstructed bowel gas pattern. Electronically Signed   By: Lovey Newcomer M.D.   On: 10/15/2019 15:48        Scheduled Meds: . aspirin EC  81 mg Oral Daily  . atorvastatin  80 mg Oral Q2000  . carvedilol  25 mg Oral BID WC  . Chlorhexidine Gluconate Cloth  6 each Topical Daily  . Chlorhexidine Gluconate Cloth  6 each Topical Q0600  . Chlorhexidine Gluconate Cloth  6 each Topical Q0600  . famotidine  20 mg Oral Daily  . heparin  5,000 Units Subcutaneous Q8H  . hydrALAZINE  50 mg Oral TID  . insulin aspart  0-5 Units Subcutaneous QHS  . insulin aspart  0-9 Units Subcutaneous TID WC  . isosorbide mononitrate  60 mg Oral Daily  . lactulose  10 g Oral TID  . linaclotide  145 mcg Oral QAC breakfast  . ranolazine  500 mg Oral BID  . sevelamer carbonate  800 mg Oral TID WC   Continuous Infusions: . cefTRIAXone (ROCEPHIN)  IV 2 g (10/16/19 1236)  . metronidazole 500 mg (10/17/19 0616)  . vancomycin Stopped (10/16/19 1045)     LOS: 6 days    Time spent: 30 minutes     Barb Merino, MD [facility] Pager [phone]

## 2019-10-17 NOTE — Progress Notes (Signed)
Patient is sitting in bed alert and awake having her breakfast.  She is anxious to have her amputation and get on with things.  Patient has no questions we will plan for transtibial amputation tomorrow.  Patient will be n.p.o. tonight

## 2019-10-17 NOTE — Progress Notes (Signed)
Pharmacy Antibiotic Note  Rebekah Johnson is a 59 y.o. female admitted on 10/11/2019 with Osteo.  Pharmacy has been consulted for Vancomycin dosing. She is noted with ESRD on HD MWF. Plans noted for transtibial amputation on 10/13.  Vanc 10/6>> CTX 10/7>> Flagyl 10/6>>  10/6 BCx: x2: neg   Plan: Vancomycin 750mg  MWF Will follow antibiotic plans post OR    Height: 5\' 4"  (162.6 cm) Weight: 67.2 kg (148 lb 2.4 oz) IBW/kg (Calculated) : 54.7  Temp (24hrs), Avg:98.4 F (36.9 C), Min:97.9 F (36.6 C), Max:98.9 F (37.2 C)  Recent Labs  Lab 10/11/19 1130 10/11/19 1143 10/11/19 1550 10/12/19 0712 10/12/19 0712 10/13/19 0038 10/14/19 0601 10/14/19 1450 10/14/19 1804 10/15/19 0122 10/16/19 0227 10/16/19 0741  WBC   < >  --   --  12.1*   < > 10.1 10.1 10.0  --  9.7 9.4  --   CREATININE  --  3.66*  --  3.87*  --   --  4.54*  --  2.38*  --   --  3.96*  LATICACIDVEN  --  4.0* 1.0  --   --   --   --   --   --   --   --   --    < > = values in this interval not displayed.    Estimated Creatinine Clearance: 14.1 mL/min (A) (by C-G formula based on SCr of 3.96 mg/dL (H)).    No Known Allergies  Hildred Laser, PharmD Clinical Pharmacist **Pharmacist phone directory can now be found on [email or web address] ([email or web address]).  Listed under [place].

## 2019-10-17 NOTE — Progress Notes (Signed)
Pt refuses insulin coverage when indicated, stating that she "doesn't take  Insulin at home." RN provided education but pt still refuses. Will continue to monitor.  Gailen Shelter RN

## 2019-10-18 ENCOUNTER — Encounter (HOSPITAL_COMMUNITY): Payer: Self-pay | Admitting: Internal Medicine

## 2019-10-18 ENCOUNTER — Encounter (HOSPITAL_COMMUNITY)
Admission: EM | Disposition: A | Discharge status: Home Health | Payer: Self-pay | Source: Home / Self Care | Attending: Internal Medicine

## 2019-10-18 ENCOUNTER — Inpatient Hospital Stay (HOSPITAL_COMMUNITY): Payer: Medicare Other | Admitting: Anesthesiology

## 2019-10-18 DIAGNOSIS — M86179 Other acute osteomyelitis, unspecified ankle and foot: Secondary | ICD-10-CM | POA: Diagnosis not present

## 2019-10-18 HISTORY — PX: AMPUTATION: SHX166

## 2019-10-18 LAB — POCT I-STAT, CHEM 8
BUN: 16 mg/dL (ref 8–23)
Calcium, Ion: 1.07 mmol/L — ABNORMAL LOW (ref 1.15–1.40)
Chloride: 94 mmol/L — ABNORMAL LOW (ref 98–111)
Creatinine, Ser: 5 mg/dL — ABNORMAL HIGH (ref 0.44–1.00)
Glucose, Bld: 108 mg/dL — ABNORMAL HIGH (ref 70–99)
HCT: 36 % (ref 36.0–46.0)
Hemoglobin: 12.2 g/dL (ref 12.0–15.0)
Potassium: 3.2 mmol/L — ABNORMAL LOW (ref 3.5–5.1)
Sodium: 136 mmol/L (ref 135–145)
TCO2: 28 mmol/L (ref 22–32)

## 2019-10-18 LAB — GLUCOSE, CAPILLARY
Glucose-Capillary: 77 mg/dL (ref 70–99)
Glucose-Capillary: 100 mg/dL — ABNORMAL HIGH (ref 70–99)
Glucose-Capillary: 112 mg/dL — ABNORMAL HIGH (ref 70–99)
Glucose-Capillary: 114 mg/dL — ABNORMAL HIGH (ref 70–99)
Glucose-Capillary: 96 mg/dL (ref 70–99)
Glucose-Capillary: 104 mg/dL — ABNORMAL HIGH (ref 70–99)

## 2019-10-18 SURGERY — AMPUTATION BELOW KNEE
Anesthesia: General | Site: Knee | Laterality: Left

## 2019-10-18 MED ORDER — METOCLOPRAMIDE HCL 5 MG PO TABS: 5.0000 mg | ORAL_TABLET | Freq: Three times a day (TID) | ORAL | Status: DC | PRN

## 2019-10-18 MED ORDER — LIDOCAINE 2% (20 MG/ML) 5 ML SYRINGE
INTRAMUSCULAR | Status: AC
  Filled 2019-10-18: qty 5

## 2019-10-18 MED ORDER — LIDOCAINE-EPINEPHRINE (PF) 1.5 %-1:200000 IJ SOLN
INTRAMUSCULAR | Status: DC | PRN
  Administered 2019-10-18: 20 mL via PERINEURAL

## 2019-10-18 MED ORDER — SODIUM CHLORIDE 0.9 % IV SOLN: INTRAVENOUS | Status: DC

## 2019-10-18 MED ORDER — MIDAZOLAM HCL 2 MG/2ML IJ SOLN
INTRAMUSCULAR | Status: AC
  Administered 2019-10-18: 2 mg via INTRAVENOUS
  Filled 2019-10-18: qty 2

## 2019-10-18 MED ORDER — ONDANSETRON HCL 4 MG/2ML IJ SOLN
INTRAMUSCULAR | Status: AC
  Filled 2019-10-18: qty 2

## 2019-10-18 MED ORDER — MIDAZOLAM HCL 2 MG/2ML IJ SOLN: 2.0000 mg | Freq: Once | INTRAMUSCULAR | Status: AC

## 2019-10-18 MED ORDER — 0.9 % SODIUM CHLORIDE (POUR BTL) OPTIME
TOPICAL | Status: DC | PRN
  Administered 2019-10-18: 1000 mL

## 2019-10-18 MED ORDER — LIDOCAINE 2% (20 MG/ML) 5 ML SYRINGE
INTRAMUSCULAR | Status: DC | PRN
  Administered 2019-10-18: 100 mg via INTRAVENOUS

## 2019-10-18 MED ORDER — METOCLOPRAMIDE HCL 5 MG/ML IJ SOLN
5.0000 mg | Freq: Three times a day (TID) | INTRAMUSCULAR | Status: DC | PRN
  Administered 2019-10-18: 10 mg via INTRAVENOUS
  Filled 2019-10-18 (×2): qty 2

## 2019-10-18 MED ORDER — BUPIVACAINE-EPINEPHRINE (PF) 0.5% -1:200000 IJ SOLN
INTRAMUSCULAR | Status: DC | PRN
  Administered 2019-10-18: 20 mL via PERINEURAL

## 2019-10-18 MED ORDER — FENTANYL CITRATE (PF) 100 MCG/2ML IJ SOLN
INTRAMUSCULAR | Status: AC
  Administered 2019-10-18: 100 ug via INTRAVENOUS
  Filled 2019-10-18: qty 2

## 2019-10-18 MED ORDER — PHENYLEPHRINE 40 MCG/ML (10ML) SYRINGE FOR IV PUSH (FOR BLOOD PRESSURE SUPPORT)
PREFILLED_SYRINGE | INTRAVENOUS | Status: DC | PRN
  Administered 2019-10-18: 80 ug via INTRAVENOUS
  Administered 2019-10-18: 120 ug via INTRAVENOUS

## 2019-10-18 MED ORDER — DOCUSATE SODIUM 100 MG PO CAPS
100.0000 mg | ORAL_CAPSULE | Freq: Two times a day (BID) | ORAL | Status: DC
  Administered 2019-10-18 – 2019-10-19 (×2): 100 mg via ORAL
  Filled 2019-10-18 (×2): qty 1

## 2019-10-18 MED ORDER — METHOCARBAMOL 1000 MG/10ML IJ SOLN
500.0000 mg | Freq: Four times a day (QID) | INTRAVENOUS | Status: DC | PRN
  Filled 2019-10-18: qty 5

## 2019-10-18 MED ORDER — FENTANYL CITRATE (PF) 250 MCG/5ML IJ SOLN
INTRAMUSCULAR | Status: AC
  Filled 2019-10-18: qty 5

## 2019-10-18 MED ORDER — CEFAZOLIN SODIUM-DEXTROSE 2-3 GM-%(50ML) IV SOLR: INTRAVENOUS | Status: DC | PRN

## 2019-10-18 MED ORDER — CHLORHEXIDINE GLUCONATE 0.12 % MT SOLN
15.0000 mL | Freq: Once | OROMUCOSAL | Status: AC
  Administered 2019-10-18: 15 mL via OROMUCOSAL
  Filled 2019-10-18: qty 15

## 2019-10-18 MED ORDER — MIDAZOLAM HCL 2 MG/2ML IJ SOLN
INTRAMUSCULAR | Status: AC
  Filled 2019-10-18: qty 2

## 2019-10-18 MED ORDER — PROPOFOL 10 MG/ML IV BOLUS
INTRAVENOUS | Status: AC
  Filled 2019-10-18: qty 20

## 2019-10-18 MED ORDER — METHOCARBAMOL 500 MG PO TABS
500.0000 mg | ORAL_TABLET | Freq: Four times a day (QID) | ORAL | Status: DC | PRN
  Administered 2019-10-19 (×2): 500 mg via ORAL
  Filled 2019-10-18 (×2): qty 1

## 2019-10-18 MED ORDER — FENTANYL CITRATE (PF) 100 MCG/2ML IJ SOLN: 100.0000 ug | Freq: Once | INTRAMUSCULAR | Status: AC

## 2019-10-18 MED ORDER — PROPOFOL 10 MG/ML IV BOLUS
INTRAVENOUS | Status: DC | PRN
  Administered 2019-10-18: 130 mg via INTRAVENOUS

## 2019-10-18 MED ORDER — HYDROMORPHONE HCL 1 MG/ML IJ SOLN
1.0000 mg | INTRAMUSCULAR | Status: DC | PRN
  Administered 2019-10-18 – 2019-10-19 (×8): 1 mg via INTRAVENOUS
  Filled 2019-10-18 (×8): qty 1

## 2019-10-18 MED ORDER — PHENYLEPHRINE HCL (PRESSORS) 10 MG/ML IV SOLN: INTRAVENOUS | Status: DC | PRN

## 2019-10-18 MED ORDER — ONDANSETRON HCL 4 MG/2ML IJ SOLN
INTRAMUSCULAR | Status: DC | PRN
  Administered 2019-10-18: 4 mg via INTRAVENOUS

## 2019-10-18 MED ORDER — OXYCODONE HCL 5 MG PO TABS
5.0000 mg | ORAL_TABLET | ORAL | Status: DC | PRN
  Administered 2019-10-18 – 2019-10-19 (×4): 10 mg via ORAL
  Filled 2019-10-18: qty 2
  Filled 2019-10-18: qty 1
  Filled 2019-10-18 (×2): qty 2
  Filled 2019-10-18: qty 1

## 2019-10-18 SURGICAL SUPPLY — 40 items
BLADE SAW RECIP 87.9 MT (BLADE) ×3 IMPLANT
BLADE SURG 21 STRL SS (BLADE) ×3 IMPLANT
BNDG COHESIVE 6X5 TAN STRL LF (GAUZE/BANDAGES/DRESSINGS) IMPLANT
CANISTER WOUND CARE 500ML ATS (WOUND CARE) ×3 IMPLANT
COVER SURGICAL LIGHT HANDLE (MISCELLANEOUS) ×3 IMPLANT
COVER WAND RF STERILE (DRAPES) IMPLANT
CUFF TOURN SGL QUICK 34 (TOURNIQUET CUFF) ×2
CUFF TRNQT CYL 34X4.125X (TOURNIQUET CUFF) ×1 IMPLANT
DRAPE DERMATAC (DRAPES) ×4 IMPLANT
DRAPE INCISE IOBAN 66X45 STRL (DRAPES) ×3 IMPLANT
DRAPE U-SHAPE 47X51 STRL (DRAPES) ×3 IMPLANT
DRESSING PREVENA PLUS CUSTOM (GAUZE/BANDAGES/DRESSINGS) ×1 IMPLANT
DRSG PREVENA PLUS CUSTOM (GAUZE/BANDAGES/DRESSINGS) ×3
DURAPREP 26ML APPLICATOR (WOUND CARE) ×3 IMPLANT
ELECT REM PT RETURN 9FT ADLT (ELECTROSURGICAL) ×3
ELECTRODE REM PT RTRN 9FT ADLT (ELECTROSURGICAL) ×1 IMPLANT
GLOVE BIOGEL PI IND STRL 9 (GLOVE) ×1 IMPLANT
GLOVE BIOGEL PI INDICATOR 9 (GLOVE) ×2
GLOVE SURG ORTHO 9.0 STRL STRW (GLOVE) ×3 IMPLANT
GOWN STRL REUS W/ TWL XL LVL3 (GOWN DISPOSABLE) ×2 IMPLANT
GOWN STRL REUS W/TWL XL LVL3 (GOWN DISPOSABLE) ×4
KIT BASIN OR (CUSTOM PROCEDURE TRAY) ×3 IMPLANT
KIT TURNOVER KIT B (KITS) ×3 IMPLANT
MANIFOLD NEPTUNE II (INSTRUMENTS) ×3 IMPLANT
NS IRRIG 1000ML POUR BTL (IV SOLUTION) ×3 IMPLANT
PACK ORTHO EXTREMITY (CUSTOM PROCEDURE TRAY) ×3 IMPLANT
PAD ARMBOARD 7.5X6 YLW CONV (MISCELLANEOUS) ×3 IMPLANT
PREVENA RESTOR ARTHOFORM 46X30 (CANNISTER) ×3 IMPLANT
PREVENA RESTOR AXIOFORM 29X28 (GAUZE/BANDAGES/DRESSINGS) ×2 IMPLANT
SPONGE LAP 18X18 RF (DISPOSABLE) IMPLANT
STAPLER VISISTAT 35W (STAPLE) IMPLANT
STOCKINETTE IMPERVIOUS LG (DRAPES) ×3 IMPLANT
SUT ETHILON 2 0 PSLX (SUTURE) IMPLANT
SUT SILK 2 0 (SUTURE) ×2
SUT SILK 2-0 18XBRD TIE 12 (SUTURE) ×1 IMPLANT
SUT VIC AB 1 CTX 27 (SUTURE) ×6 IMPLANT
TOWEL GREEN STERILE (TOWEL DISPOSABLE) ×3 IMPLANT
TUBE CONNECTING 12'X1/4 (SUCTIONS) ×1
TUBE CONNECTING 12X1/4 (SUCTIONS) ×2 IMPLANT
YANKAUER SUCT BULB TIP NO VENT (SUCTIONS) ×3 IMPLANT

## 2019-10-18 NOTE — Interval H&P Note (Signed)
History and Physical Interval Note:  10/18/2019 6:50 AM  Rebekah Johnson  has presented today for surgery, with the diagnosis of Left Foot Osteomyelitis.  The various methods of treatment have been discussed with the patient and family. After consideration of risks, benefits and other options for treatment, the patient has consented to  Procedure(s): LEFT BELOW KNEE AMPUTATION (Left) as a surgical intervention.  The patient's history has been reviewed, patient examined, no change in status, stable for surgery.  I have reviewed the patient's chart and labs.  Questions were answered to the patient's satisfaction.     Newt Minion

## 2019-10-18 NOTE — Anesthesia Preprocedure Evaluation (Addendum)
Anesthesia Evaluation  Patient identified by MRN, date of birth, ID band Patient awake    Reviewed: Allergy & Precautions, NPO status , Patient's Chart, lab work & pertinent test results  Airway Mallampati: I  TM Distance: >3 FB Neck ROM: Full    Dental   Pulmonary COPD, former smoker,    Pulmonary exam normal        Cardiovascular hypertension, Pt. on medications + CAD and + Past MI  Normal cardiovascular exam     Neuro/Psych    GI/Hepatic   Endo/Other  diabetes, Type 2, Oral Hypoglycemic Agents  Renal/GU Dialysis and ESRFRenal disease     Musculoskeletal   Abdominal   Peds  Hematology   Anesthesia Other Findings   Reproductive/Obstetrics                            Anesthesia Physical Anesthesia Plan  ASA: III  Anesthesia Plan: General   Post-op Pain Management:  Regional for Post-op pain   Induction: Intravenous  PONV Risk Score and Plan: 3  Airway Management Planned: LMA  Additional Equipment:   Intra-op Plan:   Post-operative Plan:   Informed Consent: I have reviewed the patients History and Physical, chart, labs and discussed the procedure including the risks, benefits and alternatives for the proposed anesthesia with the patient or authorized representative who has indicated his/her understanding and acceptance.       Plan Discussed with: CRNA and Surgeon  Anesthesia Plan Comments:        Anesthesia Quick Evaluation

## 2019-10-18 NOTE — Progress Notes (Signed)
Pt given 120 mL grape juice due to dietary stating G2 unavailable at this time and AM CBG of 77. Day RN updated.

## 2019-10-18 NOTE — Op Note (Signed)
   Date of Surgery: 10/18/2019  INDICATIONS: Ms. Son is a 59 y.o.-year-old female who has peripheral vascular disease she is status post revascularization to the left lower extremity and had previously healed a midfoot amputation on the left.  Patient has had progressive gangrenous changes reevaluation patient was not felt to be a revascularization candidate and patient presents at this time for transtibial amputation.[name]  PREOPERATIVE DIAGNOSIS: Gangrene left midfoot amputation  POSTOPERATIVE DIAGNOSIS: Same.  PROCEDURE: Transtibial amputation Application of Prevena wound VAC  SURGEON: Sharol Given, M.D.  ANESTHESIA:  general  IV FLUIDS AND URINE: See anesthesia.  ESTIMATED BLOOD LOSS: 100 mL.  COMPLICATIONS: None.  DESCRIPTION OF PROCEDURE: The patient was brought to the operating room and underwent a general anesthetic. After adequate levels of anesthesia were obtained patient's lower extremity was prepped using DuraPrep draped into a sterile field. A timeout was called. The foot was draped out of the sterile field with impervious stockinette. A transverse incision was made 11 cm distal to the tibial tubercle. This curved proximally and a large posterior flap was created. The tibia was transected 1 cm proximal to the skin incision. The fibula was transected just proximal to the tibial incision. The tibia was beveled anteriorly. A large posterior flap was created. The sciatic nerve was pulled cut and allowed to retract. The vascular bundles were suture ligated with 2-0 silk. The deep and superficial fascial layers were closed using #1 Vicryl. The skin was closed using staples and 2-0 nylon. The wound was covered with a Prevena wound VAC. There was a good suction fit. A prosthetic shrinker was applied. Patient was extubated taken to the PACU in stable condition.   DISCHARGE PLANNING:  Antibiotic duration: 24 hours  Weightbearing: Nonweightbearing on the left  Pain medication: Opioid  pathway  Dressing care/ Wound VAC: Continue wound VAC for 1 week after discharge  Discharge to: Skilled nursing facility  Follow-up: In the office 1 week post operative.  [name], MD [place] 12:58 PM

## 2019-10-18 NOTE — Progress Notes (Signed)
[facility] ROUNDING NOTE   Subjective:   Brief history: 59 year old African-American  Lady with a history of hypertension hyperlipidemia end-stage renal disease Monday Wednesday Friday dialysis through cardiology status post stent placement chronic diastolic dysfunction diabetes mellitus and peripheral artery disease status post transmetatarsal amputation left foot in 2017.  Was admitted with osteomyelitis of left foot.  She is being followed by orthopedics.  Last dialysis 10/16/2019 with 2.5 L removed next dialysis will be 10/18/2019  Left below-knee amputation 10/18/2019 appreciate assistance from Dr. Sharol Given    Blood pressure 146/61 pulse 70 temperature 98.6 O2 sats 98% room air     Aspirin 81 mg daily Lipitor 80 mg daily Coreg 25 mg twice daily, hydralazine 50 mg 3 times daily insulin sliding scale isosorbide 60 mg daily, Linzess 145 mg daily, Ranexa 500 mg twice daily Renvela 800 mg with meals  IV Rocephin 2 g every 24 hours IV Flagyl 500 mg every 8 hours IV vancomycin     Objective:  Vital signs in last 24 hours:  Temp:  [98 F (36.7 C)-98.7 F (37.1 C)] 98.6 F (37 C) (10/13 0330) Pulse Rate:  [71-74] 71 (10/13 0330) Resp:  [12-19] 14 (10/13 0330) BP: (133-146)/(51-68) 146/61 (10/13 0330) SpO2:  [97 %-98 %] 98 % (10/13 0330) Weight:  [66.6 kg] 66.6 kg (10/13 0630)  Weight change: -3.2 kg Filed Weights   10/16/19 0730 10/16/19 1112 10/18/19 0630  Weight: 69.8 kg 67.2 kg 66.6 kg    Intake/Output: I/O last 3 completed shifts: In: 1940 [P.O.:1440; IV Piggyback:500] Out: -    Intake/Output this shift:  No intake/output data recorded.  General:WNWD woman in bed, nad Head:NCAT sclera not icteric MMM Neck: Supple. No JVD No masses Lungs: CTA bilaterally  Heart:RRR with S1 S2 Abdomen: soft NT + BS Lower ext:L TMA with draining ulcer, foul odor, trace edema Neuro: A &O X 3. Moves all extremities spontaneously. Dialysis Access:LUE AVF  +bruit   Basic Metabolic Panel: Recent Labs  Lab 10/11/19 1143 10/11/19 1143 10/12/19 0712 10/12/19 0712 10/14/19 0601 10/14/19 1804 10/16/19 0741  NA 139  --  138  --  138 139 136  K 4.2  --  3.7  --  3.6 3.1* 3.0*  CL 96*  --  100  --  102 96* 98  CO2 27  --  26  --  24 32 26  GLUCOSE 126*  --  97  --  78 93 95  BUN 22  --  25*  --  28* 7* 15  CREATININE 3.66*  --  3.87*  --  4.54* 2.38* 3.96*  CALCIUM 8.8*   < > 8.2*   < > 8.2* 8.2* 8.0*  PHOS  --   --   --   --   --  2.1* 3.8   < > = values in this interval not displayed.    Liver Function Tests: Recent Labs  Lab 10/11/19 1143 10/12/19 0712 10/14/19 1804 10/16/19 0741  AST 23 14*  --   --   ALT 13 8  --   --   ALKPHOS 112 93  --   --   BILITOT 0.4 0.2*  --   --   PROT 8.3* 6.6  --   --   ALBUMIN 2.9* 2.4* 2.3* 2.2*   Recent Labs  Lab 10/11/19 1143  LIPASE 50   No results for input(s): AMMONIA in the last 168 hours.  CBC: Recent Labs  Lab 10/12/19 0712 10/12/19 0712 10/13/19  0038 10/14/19 0601 10/14/19 1450 10/15/19 0122 10/16/19 0227  WBC 12.1*   < > 10.1 10.1 10.0 9.7 9.4  NEUTROABS 8.5*  --  7.1 7.2  --  6.3 6.0  HGB 10.7*   < > 9.5* 10.3* 10.4* 10.4* 10.0*  HCT 35.8*   < > 31.1* 33.4* 33.6* 34.1* 32.0*  MCV 92.3   < > 91.2 92.8 89.8 90.5 92.0  PLT 329   < > 306 305 276 300 278   < > = values in this interval not displayed.    Cardiac Enzymes: No results for input(s): CKTOTAL, CKMB, CKMBINDEX, TROPONINI in the last 168 hours.  BNP: Invalid input(s): POCBNP  CBG: Recent Labs  Lab 10/17/19 0839 10/17/19 1434 10/17/19 1634 10/17/19 2139 10/18/19 0635  GLUCAP 129* 131* 104* 118* 39    Microbiology: Results for orders placed or performed during the hospital encounter of 10/11/19  Blood culture (routine x 2)     Status: None   Collection Time: 10/11/19 12:30 PM   Specimen: BLOOD RIGHT ARM  Result Value Ref Range Status   Specimen Description BLOOD RIGHT ARM  Final   Special  Requests   Final    BOTTLES DRAWN AEROBIC AND ANAEROBIC Blood Culture results may not be optimal due to an inadequate volume of blood received in culture bottles   Culture   Final    NO GROWTH 5 DAYS Performed at [facility], [place] [address], [place], [place] [address]    Report Status 10/16/2019 FINAL  Final  Blood culture (routine x 2)     Status: None   Collection Time: 10/11/19 12:45 PM   Specimen: BLOOD  Result Value Ref Range Status   Specimen Description BLOOD LEFT ANTECUBITAL  Final   Special Requests   Final    BOTTLES DRAWN AEROBIC AND ANAEROBIC Blood Culture adequate volume   Culture   Final    NO GROWTH 5 DAYS Performed at [facility], [place] [address], [place], [place] [address]    Report Status 10/16/2019 FINAL  Final  Resp Panel by RT PCR (RSV, Flu A&B, Covid) - Nasopharyngeal Swab     Status: None   Collection Time: 10/11/19  4:00 PM   Specimen: Nasopharyngeal Swab  Result Value Ref Range Status   SARS Coronavirus 2 by RT PCR NEGATIVE NEGATIVE Final    Comment: (NOTE) SARS-CoV-2 target nucleic acids are NOT DETECTED.  The SARS-CoV-2 RNA is generally detectable in upper respiratoy specimens during the acute phase of infection. The lowest concentration of SARS-CoV-2 viral copies this assay can detect is 131 copies/mL. A negative result does not preclude SARS-Cov-2 infection and should not be used as the sole basis for treatment or other patient management decisions. A negative result may occur with  improper specimen collection/handling, submission of specimen other than nasopharyngeal swab, presence of viral mutation(s) within the areas targeted by this assay, and inadequate number of viral copies (<131 copies/mL). A negative result must be combined with clinical observations, patient history, and epidemiological information. The expected result is Negative.  Fact Sheet for Patients:  PinkCheek.be  Fact Sheet for  Healthcare Providers:  GravelBags.it  This test is no t yet approved or cleared by the [place] FDA and  has been authorized for detection and/or diagnosis of SARS-CoV-2 by FDA under an Emergency Use Authorization (EUA). This EUA will remain  in effect (meaning this test can be used) for the duration of the COVID-19 declaration under Section 564(b)(1)  of the Act, 21 U.S.C. section 360bbb-3(b)(1), unless the authorization is terminated or revoked sooner.     Influenza A by PCR NEGATIVE NEGATIVE Final   Influenza B by PCR NEGATIVE NEGATIVE Final    Comment: (NOTE) The Xpert Xpress SARS-CoV-2/FLU/RSV assay is intended as an aid in  the diagnosis of influenza from Nasopharyngeal swab specimens and  should not be used as a sole basis for treatment. Nasal washings and  aspirates are unacceptable for Xpert Xpress SARS-CoV-2/FLU/RSV  testing.  Fact Sheet for Patients: PinkCheek.be  Fact Sheet for Healthcare Providers: GravelBags.it  This test is not yet approved or cleared by the [place] FDA and  has been authorized for detection and/or diagnosis of SARS-CoV-2 by  FDA under an Emergency Use Authorization (EUA). This EUA will remain  in effect (meaning this test can be used) for the duration of the  Covid-19 declaration under Section 564(b)(1) of the Act, 21  U.S.C. section 360bbb-3(b)(1), unless the authorization is  terminated or revoked.    Respiratory Syncytial Virus by PCR NEGATIVE NEGATIVE Final    Comment: (NOTE) Fact Sheet for Patients: PinkCheek.be  Fact Sheet for Healthcare Providers: GravelBags.it  This test is not yet approved or cleared by the [place] FDA and  has been authorized for detection and/or diagnosis of SARS-CoV-2 by  FDA under an Emergency Use Authorization (EUA). This EUA will remain  in effect  (meaning this test can be used) for the duration of the  COVID-19 declaration under Section 564(b)(1) of the Act, 21 U.S.C.  section 360bbb-3(b)(1), unless the authorization is terminated or  revoked. Performed at [facility], [place] [address] [place], [place] [address]     Coagulation Studies: No results for input(s): LABPROT, INR in the last 72 hours.  Urinalysis: No results for input(s): COLORURINE, LABSPEC, PHURINE, GLUCOSEU, HGBUR, BILIRUBINUR, KETONESUR, PROTEINUR, UROBILINOGEN, NITRITE, LEUKOCYTESUR in the last 72 hours.  Invalid input(s): APPERANCEUR    Imaging: No results found.   Medications:   .  ceFAZolin (ANCEF) IV    . cefTRIAXone (ROCEPHIN)  IV Stopped (10/17/19 1435)  . metronidazole 500 mg (10/18/19 4765)  . vancomycin Stopped (10/16/19 1045)   . aspirin EC  81 mg Oral Daily  . atorvastatin  80 mg Oral Q2000  . carvedilol  25 mg Oral BID WC  . Chlorhexidine Gluconate Cloth  6 each Topical Daily  . famotidine  20 mg Oral Daily  . heparin  5,000 Units Subcutaneous Q8H  . hydrALAZINE  50 mg Oral TID  . insulin aspart  0-5 Units Subcutaneous QHS  . insulin aspart  0-9 Units Subcutaneous TID WC  . isosorbide mononitrate  60 mg Oral Daily  . lactulose  10 g Oral TID  . linaclotide  145 mcg Oral QAC breakfast  . ranolazine  500 mg Oral BID  . sevelamer carbonate  800 mg Oral TID WC   acetaminophen, calcium carbonate, HYDROmorphone (DILAUDID) injection, ondansetron (ZOFRAN) IV, polyethylene glycol  Assessment/ Plan:  1. Left foot osteomyelitis - s/p L transmet amp 2017. Ortho following, patient planned for BKA 10/18/2019 2. ESRD -HD MWF.  Next hemodialysis will be 10/18/2019 3. Rectal pain /Constipation - per primary. S/p disimpaction in ED 10/4.Seems to have resolved.  4. Hypertension/volume - BP elevated. Continue home meds. Below EDW by 6kg here.  Prob euvolemic now.  5. Anemia - Hgb 10.7 No ESA needs. Follow trends. 6. Metabolic bone  disease -Ca ok. Continue Hectorol Sensipar Velphoro binder 7. Nutrition -Renal diet/prot  supp for low albumin  8. DMT2 - insulin per primary   LOS: [place] @TODAY @7 :07 AM

## 2019-10-18 NOTE — Anesthesia Postprocedure Evaluation (Signed)
Anesthesia Post Note  Patient: Rebekah Johnson  Procedure(s) Performed: LEFT BELOW KNEE AMPUTATION (Left Knee)     Patient location during evaluation: PACU Anesthesia Type: General Level of consciousness: awake and alert Pain management: pain level controlled Vital Signs Assessment: post-procedure vital signs reviewed and stable Respiratory status: spontaneous breathing, nonlabored ventilation, respiratory function stable and patient connected to nasal cannula oxygen Cardiovascular status: blood pressure returned to baseline and stable Postop Assessment: no apparent nausea or vomiting Anesthetic complications: no   No complications documented.  Last Vitals:  Vitals:   10/18/19 1332 10/18/19 1400  BP: (!) 137/58 (!) 140/57  Pulse: 71 73  Resp: 10   Temp: 36.5 C   SpO2: 100% 100%    Last Pain:  Vitals:   10/18/19 1516  TempSrc:   PainSc: 0-No pain                 Aleila Syverson DAVID

## 2019-10-18 NOTE — Progress Notes (Signed)
   10/18/19 1016  Clinical Encounter Type  Visited With Patient  Visit Type Initial  Referral From Patient  Consult/Referral To [place] visited with Ms. Rebekah Johnson before her surgery today.  Prayer was given, had an opportunity to encourage and we were able to laugh.   Chaplain Sheryl Saintil Morgan-Simpson [phone]

## 2019-10-18 NOTE — Transfer of Care (Signed)
Immediate Anesthesia Transfer of Care Note  Patient: Rebekah Johnson  Procedure(s) Performed: LEFT BELOW KNEE AMPUTATION (Left Knee)  Patient Location: PACU  Anesthesia Type:General and Regional  Level of Consciousness: lethargic  Airway & Oxygen Therapy: Patient Spontanous Breathing and Patient connected to nasal cannula oxygen  Post-op Assessment: Report given to RN  Post vital signs: Reviewed and stable  Last Vitals:  Vitals Value Taken Time  BP 152/65 10/18/19 1303  Temp    Pulse    Resp 11 10/18/19 1303  SpO2    Vitals shown include unvalidated device data.  Last Pain:  Vitals:   10/18/19 1012  TempSrc:   PainSc: 7       Patients Stated Pain Goal: 0 (40/81/44 8185)  Complications: No complications documented.

## 2019-10-18 NOTE — Progress Notes (Signed)
Pt back to room from PACU s/p L BKA.  Surgical site assessed, covered by wound vac dressing. No out put in canister at this time.  Pt resting comfortably.  Family at bedside.

## 2019-10-18 NOTE — Care Management Important Message (Signed)
Important Message  Patient Details  Name: Rebekah Johnson MRN: [id number] Date of Birth: [birth year]   Medicare Important Message Given:  Yes     [name] 10/18/2019, 10:19 AM

## 2019-10-18 NOTE — Progress Notes (Signed)
Orthopedic Tech Progress Note Patient Details:  Rebekah Johnson [birth year] [id number] Called in brace Patient ID: Rebekah Johnson, female   DOB: 10-Aug-1960, 59 y.o.   MRN: [id number]   Rebekah Johnson 10/18/2019, 2:18 PM

## 2019-10-18 NOTE — Anesthesia Procedure Notes (Signed)
Anesthesia Regional Block: Popliteal block   Pre-Anesthetic Checklist: ,, timeout performed, Correct Patient, Correct Site, Correct Laterality, Correct Procedure, Correct Position, site marked, Risks and benefits discussed,  Surgical consent,  Pre-op evaluation,  At surgeon's request and post-op pain management  Laterality: Left  Prep: chloraprep       Needles:  Injection technique: Single-shot  Needle Type: Echogenic Stimulator Needle     Needle Length: 10cm  Needle Gauge: 21     Additional Needles:   Procedures:, nerve stimulator,,,,,,,   Nerve Stimulator or Paresthesia:  Response: 0.4 mA,   Additional Responses:   Narrative:  Start time: 10/18/2019 11:05 AM End time: 10/18/2019 11:20 AM Injection made incrementally with aspirations every 5 mL.  Performed by: Personally  Anesthesiologist: Lillia Abed, MD  Additional Notes: Monitors applied. Patient sedated. Sterile prep and drape,hand hygiene and sterile gloves were used. Relevant anatomy identified.Needle position confirmed.Local anesthetic injected incrementally after negative aspiration. Local anesthetic spread visualized around nerve(s). Vascular puncture avoided. No complications. Image printed for medical record.The patient tolerated the procedure well.  Additional Saphenous nerve block performed. 15cc Local Anesthetic mixture placed under ultrasonic guidance along the medio-inferior border of the Sartorious muscle 6 inches above the knee.  No Problems encountered.  Lillia Abed MD

## 2019-10-18 NOTE — Anesthesia Procedure Notes (Signed)
Procedure Name: LMA Insertion Date/Time: 10/18/2019 12:30 PM Performed by: Glynda Jaeger, CRNA Pre-anesthesia Checklist: Patient identified, Emergency Drugs available, Suction available and Patient being monitored Patient Re-evaluated:Patient Re-evaluated prior to induction Oxygen Delivery Method: Circle System Utilized Preoxygenation: Pre-oxygenation with 100% oxygen Induction Type: IV induction Ventilation: Mask ventilation without difficulty LMA: LMA inserted LMA Size: 4.0 Number of attempts: 1 Airway Equipment and Method: Bite block Placement Confirmation: positive ETCO2 Tube secured with: Tape Dental Injury: Teeth and Oropharynx as per pre-operative assessment

## 2019-10-18 NOTE — Progress Notes (Signed)
Edison Nasuti, RN was infomred by Dr. Edrick Oh that the pt's tx has been moved to 10/19/19; called to verify.

## 2019-10-18 NOTE — Progress Notes (Signed)
PROGRESS NOTE    KASIDEE VOISIN  MWN:[id number] DOB: [birth year] DOA: 10/11/2019 PCP: Patient, No Pcp Per    Brief Narrative:  59 year old female with history of hypertension, hyperlipidemia, ESRD on dialysis Monday Wednesday Friday, coronary artery disease, chronic combined heart failure, diabetes with neuropathy and peripheral vascular disease, left metatarsal foot amputation presented to the hospital with severe left pain and abdominal pain.  Patient has severe peripheral arterial disease.  She is also on pain medication regimen with opiates at home causing severe constipation.  She was disimpacted under sedation and discharged home with bowel regimen on last follow-up. In the emergency room hemodynamically stable.  Patient was found with left foot osteomyelitis, CT abdomen showed proctitis.  Admitted for surgical intervention and now planned for transtibial amputation.   Assessment & Plan:   Active Problems:   Osteomyelitis of foot, acute (HCC)   Non-pressure chronic ulcer of other part of left foot limited to breakdown of skin (HCC)   Pyogenic inflammation of bone (HCC)  Acute osteomyelitis of the left foot present on admission with nonhealing ulcer and severe peripheral arterial disease: Currently remains on broad-spectrum antibiotics. We will continue until postop.  Undergoing below-knee amputation today.  We will discontinue antibiotics when she undergoes surgery to the clean margin.  Cultures negative so far.   Rectal pain/opiate induced constipation: On a scheduled laxative with improvement.  Avoid opiate as much possible.  ESRD on hemodialysis: Getting dialysis on her schedule.  Followed by nephrology.  Dyspepsia: We will add Pepcid and Tums as needed.  Some improvement today.   DVT prophylaxis: heparin injection 5,000 Units Start: 10/11/19 2200 SCDs Start: 10/11/19 1702   Code Status: Full code Family Communication: None, patient is communicating with her  friends. Disposition Plan: Status is: Inpatient  Remains inpatient appropriate because:Inpatient level of care appropriate due to severity of illness   Dispo:  Patient From: Home  Planned Disposition: Home with Health Care Svc  Expected discharge date: 10/20/19  Medically stable for discharge: No          Consultants:   Nephrology  Orthopedics  Vascular  Procedures:   Dialysis  Antimicrobials:  Antibiotics Given (last 72 hours)    Date/Time Action Medication Dose Rate   10/15/19 1234 New Bag/Given   [MAR Hold] cefTRIAXone (ROCEPHIN) 2 g in sodium chloride 0.9 % 100 mL IVPB 2 g 200 mL/hr   10/15/19 1440 New Bag/Given   [MAR Hold] metroNIDAZOLE (FLAGYL) IVPB 500 mg 500 mg 100 mL/hr   10/15/19 2300 New Bag/Given   [MAR Hold] metroNIDAZOLE (FLAGYL) IVPB 500 mg 500 mg 100 mL/hr   10/16/19 0530 New Bag/Given   [MAR Hold] metroNIDAZOLE (FLAGYL) IVPB 500 mg 500 mg 100 mL/hr   10/16/19 0945 New Bag/Given   [MAR Hold] vancomycin (VANCOCIN) IVPB 750 mg/150 ml premix 750 mg 150 mL/hr   10/16/19 1236 New Bag/Given   [MAR Hold] cefTRIAXone (ROCEPHIN) 2 g in sodium chloride 0.9 % 100 mL IVPB 2 g 200 mL/hr   10/16/19 1552 New Bag/Given   [MAR Hold] metroNIDAZOLE (FLAGYL) IVPB 500 mg 500 mg 100 mL/hr   10/16/19 2107 New Bag/Given   [MAR Hold] metroNIDAZOLE (FLAGYL) IVPB 500 mg 500 mg 100 mL/hr   10/17/19 0616 New Bag/Given   [MAR Hold] metroNIDAZOLE (FLAGYL) IVPB 500 mg 500 mg 100 mL/hr   10/17/19 1404 New Bag/Given   [MAR Hold] cefTRIAXone (ROCEPHIN) 2 g in sodium chloride 0.9 % 100 mL IVPB 2 g 200 mL/hr   10/17/19 1457  New Bag/Given   [MAR Hold] metroNIDAZOLE (FLAGYL) IVPB 500 mg 500 mg 100 mL/hr   10/17/19 2134 New Bag/Given   [MAR Hold] metroNIDAZOLE (FLAGYL) IVPB 500 mg 500 mg 100 mL/hr   10/18/19 0643 New Bag/Given   [MAR Hold] metroNIDAZOLE (FLAGYL) IVPB 500 mg 500 mg 100 mL/hr         Subjective: Patient seen and examined.  No overnight events.  Examined  patient at 9 AM in the morning before she is going for surgery.  She denies any complaints.  Denies any nausea vomiting.  Her blood sugars were 70 7 in the morning but herself did not have any complaints.  Objective: Vitals:   10/18/19 1145 10/18/19 1150 10/18/19 1155 10/18/19 1200  BP: (!) 187/52 (!) 183/57 (!) 202/58   Pulse: 65 71 66 67  Resp: 11 15 11 17   Temp:      TempSrc:      SpO2: 100% 100% 100% 100%  Weight:      Height:        Intake/Output Summary (Last 24 hours) at 10/18/2019 1202 Last data filed at 10/18/2019 0700 Gross per 24 hour  Intake 1368.19 ml  Output --  Net 1368.19 ml   Filed Weights   10/16/19 0730 10/16/19 1112 10/18/19 0630  Weight: 69.8 kg 67.2 kg 66.6 kg    Examination:  General exam: Appears calm and comfortable, on room air.  Chronically sick looking but not in any distress. Respiratory system: Clear to auscultation. Respiratory effort normal. Cardiovascular system: S1 & S2 heard, RRR. Gastrointestinal system: Abdomen is nondistended, soft and nontender. No organomegaly or masses felt. Normal bowel sounds heard. Central nervous system: Alert and oriented. No focal neurological deficits. Extremities: Symmetric 5 x 5 power. Skin:  Patient has swollen, draining ulcer on the left lower extremity transmetatarsal amputation stump. Patient has left upper extremity AV fistula with thrill.    Data Reviewed: I have personally reviewed following labs and imaging studies  CBC: Recent Labs  Lab 10/12/19 0712 10/12/19 0712 10/13/19 0038 10/13/19 0038 10/14/19 0601 10/14/19 1450 10/15/19 0122 10/16/19 0227 10/18/19 1051  WBC 12.1*   < > 10.1  --  10.1 10.0 9.7 9.4  --   NEUTROABS 8.5*  --  7.1  --  7.2  --  6.3 6.0  --   HGB 10.7*   < > 9.5*   < > 10.3* 10.4* 10.4* 10.0* 12.2  HCT 35.8*   < > 31.1*   < > 33.4* 33.6* 34.1* 32.0* 36.0  MCV 92.3   < > 91.2  --  92.8 89.8 90.5 92.0  --   PLT 329   < > 306  --  305 276 300 278  --    < > = values  in this interval not displayed.   Basic Metabolic Panel: Recent Labs  Lab 10/12/19 0712 10/14/19 0601 10/14/19 1804 10/16/19 0741 10/18/19 1051  NA 138 138 139 136 136  K 3.7 3.6 3.1* 3.0* 3.2*  CL 100 102 96* 98 94*  CO2 26 24 32 26  --   GLUCOSE 97 78 93 95 108*  BUN 25* 28* 7* 15 16  CREATININE 3.87* 4.54* 2.38* 3.96* 5.00*  CALCIUM 8.2* 8.2* 8.2* 8.0*  --   PHOS  --   --  2.1* 3.8  --    GFR: Estimated Creatinine Clearance: 11.1 mL/min (A) (by C-G formula based on SCr of 5 mg/dL (H)). Liver Function Tests: Recent Labs  Lab 10/12/19  4010 10/14/19 1804 10/16/19 0741  AST 14*  --   --   ALT 8  --   --   ALKPHOS 93  --   --   BILITOT 0.2*  --   --   PROT 6.6  --   --   ALBUMIN 2.4* 2.3* 2.2*   No results for input(s): LIPASE, AMYLASE in the last 168 hours. No results for input(s): AMMONIA in the last 168 hours. Coagulation Profile: No results for input(s): INR, PROTIME in the last 168 hours. Cardiac Enzymes: No results for input(s): CKTOTAL, CKMB, CKMBINDEX, TROPONINI in the last 168 hours. BNP (last 3 results) No results for input(s): PROBNP in the last 8760 hours. HbA1C: No results for input(s): HGBA1C in the last 72 hours. CBG: Recent Labs  Lab 10/17/19 1634 10/17/19 2139 10/18/19 0635 10/18/19 0949 10/18/19 1045  GLUCAP 104* 118* 77 100* 112*   Lipid Profile: No results for input(s): CHOL, HDL, LDLCALC, TRIG, CHOLHDL, LDLDIRECT in the last 72 hours. Thyroid Function Tests: No results for input(s): TSH, T4TOTAL, FREET4, T3FREE, THYROIDAB in the last 72 hours. Anemia Panel: No results for input(s): VITAMINB12, FOLATE, FERRITIN, TIBC, IRON, RETICCTPCT in the last 72 hours. Sepsis Labs: Recent Labs  Lab 10/11/19 1550  LATICACIDVEN 1.0    Recent Results (from the past 240 hour(s))  Blood culture (routine x 2)     Status: None   Collection Time: 10/11/19 12:30 PM   Specimen: BLOOD RIGHT ARM  Result Value Ref Range Status   Specimen Description  BLOOD RIGHT ARM  Final   Special Requests   Final    BOTTLES DRAWN AEROBIC AND ANAEROBIC Blood Culture results may not be optimal due to an inadequate volume of blood received in culture bottles   Culture   Final    NO GROWTH 5 DAYS Performed at [facility], [place] [address], [place], [place] [address]    Report Status 10/16/2019 FINAL  Final  Blood culture (routine x 2)     Status: None   Collection Time: 10/11/19 12:45 PM   Specimen: BLOOD  Result Value Ref Range Status   Specimen Description BLOOD LEFT ANTECUBITAL  Final   Special Requests   Final    BOTTLES DRAWN AEROBIC AND ANAEROBIC Blood Culture adequate volume   Culture   Final    NO GROWTH 5 DAYS Performed at [facility], [place] [address] [place], [place] [address]    Report Status 10/16/2019 FINAL  Final  Resp Panel by RT PCR (RSV, Flu A&B, Covid) - Nasopharyngeal Swab     Status: None   Collection Time: 10/11/19  4:00 PM   Specimen: Nasopharyngeal Swab  Result Value Ref Range Status   SARS Coronavirus 2 by RT PCR NEGATIVE NEGATIVE Final    Comment: (NOTE) SARS-CoV-2 target nucleic acids are NOT DETECTED.  The SARS-CoV-2 RNA is generally detectable in upper respiratoy specimens during the acute phase of infection. The lowest concentration of SARS-CoV-2 viral copies this assay can detect is 131 copies/mL. A negative result does not preclude SARS-Cov-2 infection and should not be used as the sole basis for treatment or other patient management decisions. A negative result may occur with  improper specimen collection/handling, submission of specimen other than nasopharyngeal swab, presence of viral mutation(s) within the areas targeted by this assay, and inadequate number of viral copies (<131 copies/mL). A negative result must be combined with clinical observations, patient history, and epidemiological information. The expected result is Negative.  Fact Sheet for Patients:   PinkCheek.be  Fact Sheet for Healthcare Providers:  GravelBags.it  This test is no t yet approved or cleared by the [place] FDA and  has been authorized for detection and/or diagnosis of SARS-CoV-2 by FDA under an Emergency Use Authorization (EUA). This EUA will remain  in effect (meaning this test can be used) for the duration of the COVID-19 declaration under Section 564(b)(1) of the Act, 21 U.S.C. section 360bbb-3(b)(1), unless the authorization is terminated or revoked sooner.     Influenza A by PCR NEGATIVE NEGATIVE Final   Influenza B by PCR NEGATIVE NEGATIVE Final    Comment: (NOTE) The Xpert Xpress SARS-CoV-2/FLU/RSV assay is intended as an aid in  the diagnosis of influenza from Nasopharyngeal swab specimens and  should not be used as a sole basis for treatment. Nasal washings and  aspirates are unacceptable for Xpert Xpress SARS-CoV-2/FLU/RSV  testing.  Fact Sheet for Patients: PinkCheek.be  Fact Sheet for Healthcare Providers: GravelBags.it  This test is not yet approved or cleared by the [place] FDA and  has been authorized for detection and/or diagnosis of SARS-CoV-2 by  FDA under an Emergency Use Authorization (EUA). This EUA will remain  in effect (meaning this test can be used) for the duration of the  Covid-19 declaration under Section 564(b)(1) of the Act, 21  U.S.C. section 360bbb-3(b)(1), unless the authorization is  terminated or revoked.    Respiratory Syncytial Virus by PCR NEGATIVE NEGATIVE Final    Comment: (NOTE) Fact Sheet for Patients: PinkCheek.be  Fact Sheet for Healthcare Providers: GravelBags.it  This test is not yet approved or cleared by the [place] FDA and  has been authorized for detection and/or diagnosis of SARS-CoV-2 by  FDA under an Emergency  Use Authorization (EUA). This EUA will remain  in effect (meaning this test can be used) for the duration of the  COVID-19 declaration under Section 564(b)(1) of the Act, 21 U.S.C.  section 360bbb-3(b)(1), unless the authorization is terminated or  revoked. Performed at [facility], [place] [address] [place], [place] [address]          Radiology Studies: No results found.      Scheduled Meds: . [MAR Hold] aspirin EC  81 mg Oral Daily  . [MAR Hold] atorvastatin  80 mg Oral Q2000  . [MAR Hold] carvedilol  25 mg Oral BID WC  . [MAR Hold] Chlorhexidine Gluconate Cloth  6 each Topical Daily  . [MAR Hold] famotidine  20 mg Oral Daily  . [MAR Hold] heparin  5,000 Units Subcutaneous Q8H  . [MAR Hold] hydrALAZINE  50 mg Oral TID  . [MAR Hold] insulin aspart  0-5 Units Subcutaneous QHS  . [MAR Hold] insulin aspart  0-9 Units Subcutaneous TID WC  . [MAR Hold] isosorbide mononitrate  60 mg Oral Daily  . [MAR Hold] lactulose  10 g Oral TID  . [MAR Hold] linaclotide  145 mcg Oral QAC breakfast  . [MAR Hold] ranolazine  500 mg Oral BID  . [MAR Hold] sevelamer carbonate  800 mg Oral TID WC   Continuous Infusions: . sodium chloride 10 mL/hr at 10/18/19 1046  .  ceFAZolin (ANCEF) IV    . [MAR Hold] cefTRIAXone (ROCEPHIN)  IV Stopped (10/17/19 1435)  . [MAR Hold] metronidazole 100 mL/hr at 10/18/19 0700  . [MAR Hold] vancomycin Stopped (10/16/19 1045)     LOS: 7 days    Time spent: 30 minutes     Barb Merino,  MD [facility] Pager [phone]

## 2019-10-18 NOTE — Progress Notes (Signed)
Pt still relatively comfortable from nerve block administered perioperatively but concerned about pain management as block wears off.  States that she does not handle pain well at all and "will show out."  I have given her oral and IV prns in order to keep her more comfortable as the numbness wears off. MD increased frequency of IV dilaudid from q6h to q2h as well.  She also hoped she could skip dialysis today.  Dr. Justin Mend notified and was okay with pt going tomorrow instead.  HD unit notified as well.

## 2019-10-19 ENCOUNTER — Encounter (HOSPITAL_COMMUNITY): Payer: Self-pay | Admitting: Orthopedic Surgery

## 2019-10-19 DIAGNOSIS — M86179 Other acute osteomyelitis, unspecified ankle and foot: Secondary | ICD-10-CM | POA: Diagnosis not present

## 2019-10-19 LAB — GLUCOSE, CAPILLARY
Glucose-Capillary: 95 mg/dL (ref 70–99)
Glucose-Capillary: 94 mg/dL (ref 70–99)

## 2019-10-19 NOTE — Progress Notes (Signed)
[facility] ROUNDING NOTE   Subjective:   Brief history: 59 year old African-American  Lady with a history of hypertension hyperlipidemia end-stage renal disease Monday Wednesday Friday dialysis through cardiology status post stent placement chronic diastolic dysfunction diabetes mellitus and peripheral artery disease status post transmetatarsal amputation left foot in 2017.  Was admitted with osteomyelitis of left foot.  She is being followed by orthopedics.  Last dialysis 10/16/2019 with 2.5 L removed, patient underwent amputation 10/18/2019 and will require dialysis 10/19/2019.  She will be off schedule  Left below-knee amputation 10/18/2019 appreciate assistance from Dr. Sharol Given  Blood pressure blood pressure 1 5548 pulse 70 temperature 98.2 O2 sats 95% room air  Sodium 136 potassium 3.2 chloride 94 glucose 108 BUN 16 creatinine 5 hemoglobin 12.2    Aspirin 81 mg daily Lipitor 80 mg daily Coreg 25 mg twice daily, hydralazine 50 mg 3 times daily insulin sliding scale isosorbide 60 mg daily, Linzess 145 mg daily, Ranexa 500 mg twice daily Renvela 800 mg with meals  IV Rocephin 2 g every 24 hours IV Flagyl 500 mg every 8 hours IV vancomycin     Objective:  Vital signs in last 24 hours:  Temp:  [97.7 F (36.5 C)-98.6 F (37 C)] 98.2 F (36.8 C) (10/14 0332) Pulse Rate:  [60-78] 77 (10/14 0332) Resp:  [8-20] 20 (10/14 0332) BP: (137-202)/(48-70) 155/48 (10/14 0332) SpO2:  [94 %-100 %] 99 % (10/14 0332)  Weight change:  Filed Weights   10/16/19 0730 10/16/19 1112 10/18/19 0630  Weight: 69.8 kg 67.2 kg 66.6 kg    Intake/Output: I/O last 3 completed shifts: In: 2236.9 [P.O.:837; I.V.:900; Other:100; IV Piggyback:399.9] Out: 100 [Blood:100]   Intake/Output this shift:  No intake/output data recorded.  General:WNWD woman in bed, nad Head:NCAT sclera not icteric MMM Neck: Supple. No JVD No masses Lungs: CTA bilaterally  Heart:RRR with S1 S2 Abdomen: soft  NT + BS Lower ext:L TMA with draining ulcer, foul odor, trace edema Neuro: A &O X 3. Moves all extremities spontaneously. Dialysis Access:LUE AVF +bruit   Basic Metabolic Panel: Recent Labs  Lab 10/14/19 0601 10/14/19 1804 10/16/19 0741 10/18/19 1051  NA 138 139 136 136  K 3.6 3.1* 3.0* 3.2*  CL 102 96* 98 94*  CO2 24 32 26  --   GLUCOSE 78 93 95 108*  BUN 28* 7* 15 16  CREATININE 4.54* 2.38* 3.96* 5.00*  CALCIUM 8.2* 8.2* 8.0*  --   PHOS  --  2.1* 3.8  --     Liver Function Tests: Recent Labs  Lab 10/14/19 1804 10/16/19 0741  ALBUMIN 2.3* 2.2*   No results for input(s): LIPASE, AMYLASE in the last 168 hours. No results for input(s): AMMONIA in the last 168 hours.  CBC: Recent Labs  Lab 10/13/19 0038 10/13/19 0038 10/14/19 0601 10/14/19 1450 10/15/19 0122 10/16/19 0227 10/18/19 1051  WBC 10.1  --  10.1 10.0 9.7 9.4  --   NEUTROABS 7.1  --  7.2  --  6.3 6.0  --   HGB 9.5*   < > 10.3* 10.4* 10.4* 10.0* 12.2  HCT 31.1*   < > 33.4* 33.6* 34.1* 32.0* 36.0  MCV 91.2  --  92.8 89.8 90.5 92.0  --   PLT 306  --  305 276 300 278  --    < > = values in this interval not displayed.    Cardiac Enzymes: No results for input(s): CKTOTAL, CKMB, CKMBINDEX, TROPONINI in the last 168 hours.  BNP:  Invalid input(s): POCBNP  CBG: Recent Labs  Lab 10/18/19 1045 10/18/19 1308 10/18/19 1655 10/18/19 2110 10/19/19 0637  GLUCAP 112* 114* 96 104* 95    Microbiology: Results for orders placed or performed during the hospital encounter of 10/11/19  Blood culture (routine x 2)     Status: None   Collection Time: 10/11/19 12:30 PM   Specimen: BLOOD RIGHT ARM  Result Value Ref Range Status   Specimen Description BLOOD RIGHT ARM  Final   Special Requests   Final    BOTTLES DRAWN AEROBIC AND ANAEROBIC Blood Culture results may not be optimal due to an inadequate volume of blood received in culture bottles   Culture   Final    NO GROWTH 5 DAYS Performed at [facility], [place] [address], [place], [place] [address]    Report Status 10/16/2019 FINAL  Final  Blood culture (routine x 2)     Status: None   Collection Time: 10/11/19 12:45 PM   Specimen: BLOOD  Result Value Ref Range Status   Specimen Description BLOOD LEFT ANTECUBITAL  Final   Special Requests   Final    BOTTLES DRAWN AEROBIC AND ANAEROBIC Blood Culture adequate volume   Culture   Final    NO GROWTH 5 DAYS Performed at [facility], [place] [address], [place], [place] [address]    Report Status 10/16/2019 FINAL  Final  Resp Panel by RT PCR (RSV, Flu A&B, Covid) - Nasopharyngeal Swab     Status: None   Collection Time: 10/11/19  4:00 PM   Specimen: Nasopharyngeal Swab  Result Value Ref Range Status   SARS Coronavirus 2 by RT PCR NEGATIVE NEGATIVE Final    Comment: (NOTE) SARS-CoV-2 target nucleic acids are NOT DETECTED.  The SARS-CoV-2 RNA is generally detectable in upper respiratoy specimens during the acute phase of infection. The lowest concentration of SARS-CoV-2 viral copies this assay can detect is 131 copies/mL. A negative result does not preclude SARS-Cov-2 infection and should not be used as the sole basis for treatment or other patient management decisions. A negative result may occur with  improper specimen collection/handling, submission of specimen other than nasopharyngeal swab, presence of viral mutation(s) within the areas targeted by this assay, and inadequate number of viral copies (<131 copies/mL). A negative result must be combined with clinical observations, patient history, and epidemiological information. The expected result is Negative.  Fact Sheet for Patients:  PinkCheek.be  Fact Sheet for Healthcare Providers:  GravelBags.it  This test is no t yet approved or cleared by the [place] FDA and  has been authorized for detection and/or diagnosis of SARS-CoV-2 by FDA under an Emergency  Use Authorization (EUA). This EUA will remain  in effect (meaning this test can be used) for the duration of the COVID-19 declaration under Section 564(b)(1) of the Act, 21 U.S.C. section 360bbb-3(b)(1), unless the authorization is terminated or revoked sooner.     Influenza A by PCR NEGATIVE NEGATIVE Final   Influenza B by PCR NEGATIVE NEGATIVE Final    Comment: (NOTE) The Xpert Xpress SARS-CoV-2/FLU/RSV assay is intended as an aid in  the diagnosis of influenza from Nasopharyngeal swab specimens and  should not be used as a sole basis for treatment. Nasal washings and  aspirates are unacceptable for Xpert Xpress SARS-CoV-2/FLU/RSV  testing.  Fact Sheet for Patients: PinkCheek.be  Fact Sheet for Healthcare Providers: GravelBags.it  This test is not yet approved or cleared by the [place] and  has been authorized for detection and/or diagnosis of SARS-CoV-2 by  FDA under an Emergency Use Authorization (EUA). This EUA will remain  in effect (meaning this test can be used) for the duration of the  Covid-19 declaration under Section 564(b)(1) of the Act, 21  U.S.C. section 360bbb-3(b)(1), unless the authorization is  terminated or revoked.    Respiratory Syncytial Virus by PCR NEGATIVE NEGATIVE Final    Comment: (NOTE) Fact Sheet for Patients: PinkCheek.be  Fact Sheet for Healthcare Providers: GravelBags.it  This test is not yet approved or cleared by the [place] FDA and  has been authorized for detection and/or diagnosis of SARS-CoV-2 by  FDA under an Emergency Use Authorization (EUA). This EUA will remain  in effect (meaning this test can be used) for the duration of the  COVID-19 declaration under Section 564(b)(1) of the Act, 21 U.S.C.  section 360bbb-3(b)(1), unless the authorization is terminated or  revoked. Performed at [place], [place] [address] [place], [place] [address]     Coagulation Studies: No results for input(s): LABPROT, INR in the last 72 hours.  Urinalysis: No results for input(s): COLORURINE, LABSPEC, PHURINE, GLUCOSEU, HGBUR, BILIRUBINUR, KETONESUR, PROTEINUR, UROBILINOGEN, NITRITE, LEUKOCYTESUR in the last 72 hours.  Invalid input(s): APPERANCEUR    Imaging: No results found.   Medications:   . sodium chloride    . cefTRIAXone (ROCEPHIN)  IV Stopped (10/17/19 1435)  . methocarbamol (ROBAXIN) IV    . metronidazole 500 mg (10/19/19 0654)  . vancomycin Stopped (10/16/19 1045)   . aspirin EC  81 mg Oral Daily  . atorvastatin  80 mg Oral Q2000  . carvedilol  25 mg Oral BID WC  . Chlorhexidine Gluconate Cloth  6 each Topical Daily  . docusate sodium  100 mg Oral BID  . famotidine  20 mg Oral Daily  . heparin  5,000 Units Subcutaneous Q8H  . hydrALAZINE  50 mg Oral TID  . insulin aspart  0-5 Units Subcutaneous QHS  . insulin aspart  0-9 Units Subcutaneous TID WC  . isosorbide mononitrate  60 mg Oral Daily  . lactulose  10 g Oral TID  . linaclotide  145 mcg Oral QAC breakfast  . ranolazine  500 mg Oral BID  . sevelamer carbonate  800 mg Oral TID WC   acetaminophen, calcium carbonate, HYDROmorphone (DILAUDID) injection, methocarbamol **OR** methocarbamol (ROBAXIN) IV, metoCLOPramide **OR** metoCLOPramide (REGLAN) injection, ondansetron (ZOFRAN) IV, oxyCODONE, polyethylene glycol  Assessment/ Plan:  1. Left foot osteomyelitis - s/p L transmet amp 2017. Ortho following, patient planned for BKA 10/18/2019 2. ESRD -HD MWF.  Dialysis postponed until 10/19/2019.  We will plan the next dialysis treatment 10/21/2019 she will be off schedule 3. Rectal pain /Constipation - per primary. S/p disimpaction in ED 10/4.Seems to have resolved.  4. Hypertension/volume -stable at this point continue dialysis with ultrafiltration 5. Anemia - Hgb 10.7 No ESA needs. Follow trends. 6. Metabolic bone  disease -Ca ok. Continue Hectorol Sensipar Velphoro binder 7. Nutrition -Renal diet/prot supp for low albumin  8. DMT2 - insulin per primary   LOS: [place] @TODAY @7 :18 AM

## 2019-10-19 NOTE — Evaluation (Signed)
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 PMhx: HTN, HLD, ESRD MWF, DM, PAD, left transmet in 2017   Clinical Impression   Patient admitted for the above diagnosis.  Presents with pain to her residual limb, decreased safety with pain, decreased balance in stand; all of which, are impacting independence in the acute level of care.  Patient states she was independent at prior level of function.  OT recommends [facility] to ensure a safe transition home, and to make any recommendations for additional DME.  No further acute needs, as patient is discharging home.      Follow Up Recommendations  Home health OT    Equipment Recommendations    n/a   Recommendations for Other Services       Precautions / Restrictions Precautions Precautions: Fall Precaution Comments: Left BKA Required Braces or Orthoses: Other Brace Other Brace: limb protector Restrictions Weight Bearing Restrictions: Yes LLE Weight Bearing: Non weight bearing      Mobility Bed Mobility                  Transfers Overall transfer level: Needs assistance   Transfers: Sit to/from Stand;Stand Pivot Transfers Sit to Stand: Min guard Stand pivot transfers: Min guard                                                       ADL either performed or assessed with clinical judgement   ADL Overall ADL's : Needs assistance/impaired Eating/Feeding: Independent;Sitting   Grooming: Wash/dry hands;Wash/dry face;Set up   Upper Body Bathing: Set up;Sitting   Lower Body Bathing: Min guard;Sit to/from stand   Upper Body Dressing : Set up;Sitting   Lower Body Dressing: Min guard;Sit to/from stand Lower Body Dressing Details (indicate cue type and reason): RW for stability Toilet Transfer: Supervision/safety;Stand-pivot;BSC    Toileting- Clothing Manipulation and Hygiene: Sit to/from stand;Min guard Toileting - Clothing Manipulation Details (indicate cue type and reason): RW for stability     Functional mobility during ADLs: Min guard       Vision Baseline Vision/History: No visual deficits Patient Visual Report: No change from baseline       Perception     Praxis      Pertinent Vitals/Pain Pain Assessment: Faces Faces Pain Scale: Hurts whole lot Pain Location: LLE Pain Descriptors / Indicators: Aching;Guarding;Constant Pain Intervention(s): Monitored during session;Repositioned     Hand Dominance Right   Extremity/Trunk Assessment Upper Extremity Assessment Upper Extremity Assessment: Overall WFL for tasks assessed   Lower Extremity Assessment Lower Extremity Assessment: Defer to PT evaluation       Communication Communication Communication: No difficulties   Cognition Arousal/Alertness: Awake/alert Behavior During Therapy: WFL for tasks assessed/performed Overall Cognitive Status: Within Functional Limits for tasks assessed                                                      Home Living Family/patient expects to be discharged to:: Private residence Living Arrangements: Non-relatives/Friends Available Help at Discharge: Family;Available 24 hours/day Type  of Home: House Home Access: Stairs to enter [employer] of Steps: 3   Home Layout: One level     Bathroom Shower/Tub: Teacher, early years/pre: Standard     Home Equipment: Environmental consultant - 2 wheels;Bedside commode;Wheelchair - manual;Tub bench;Walker - 4 wheels;Cane - single point   Additional Comments: typically takes sink baths      Prior Functioning/Environment Level of Independence: Independent                 OT Problem List: Impaired balance (sitting and/or standing);Decreased safety awareness;Pain      OT Treatment/Interventions:      OT Goals(Current goals can be  found in the care plan section) Acute Rehab OT Goals Patient Stated Goal: go home OT Goal Formulation: With patient Time For Goal Achievement: 10/23/19 Potential to Achieve Goals: Good  OT Frequency:     Barriers to D/C:  medical status          Co-evaluation              AM-PAC OT "6 Clicks" Daily Activity     Outcome Measure Help from another person eating meals?: None Help from another person taking care of personal grooming?: A Little Help from another person toileting, which includes using toliet, bedpan, or urinal?: A Little Help from another person bathing (including washing, rinsing, drying)?: A Little Help from another person to put on and taking off regular upper body clothing?: A Little Help from another person to put on and taking off regular lower body clothing?: A Little 6 Click Score: 19   End of Session Equipment Utilized During Treatment: Rolling walker Nurse Communication: Other (comment) (had a BM)  Activity Tolerance: Patient tolerated treatment well Patient left: in chair;with call bell/phone within reach  OT Visit Diagnosis: Unsteadiness on feet (R26.81);Pain Pain - Right/Left: Left Pain - part of body: Leg                Time: 2482-5003 OT Time Calculation (min): 33 min Charges:  OT General Charges $OT Visit: 1 Visit OT Evaluation $OT Eval Moderate Complexity: 1 Mod OT Treatments $Self Care/Home Management : 8-22 mins  10/19/2019  Rich, OTR/L  Acute Rehabilitation Services  Office:  [place] 10/19/2019, 2:47 PM

## 2019-10-19 NOTE — Progress Notes (Signed)
Renal Navigator notes that patient has refused HD today and is asking when she can go home. PT has recommended home with [facility]. Navigator spoke with Nephrologist/Dr. Justin Mend who agrees with next HD at her outpatient clinic if she plan is for discharge today or tomorrow.  Navigator met with patient, who was friendly and welcoming. She states, "I need to see you," when Renal Navigator introduced self as Education officer, museum with the Nephrology team. She states she had a court date on 10/12/19, but was admitted on 10/6 and missed this. She needs documentation that she was in the hospital from 10/11/19 to present. Navigator left message for floor CSW, but suggests that her AVS may suffice.  Navigator explained reason for visit-to discuss next HD treatment, since she did not want it today in the hospital, depending on her discharge plan. Patient states she wants to go home and have her next treatment at her own clinic tomorrow. Navigator notes that PT has made recommendation for home with [facility]. Navigator told patient that Navigator will speak with team to discuss her plan. She again states hopefulness to be able to go home. Navigator told her that if discharge is not until tomorrow, there is still the possibility that we can move her HD treatment from first shift (her normal time) to second shift at her home clinic so that she can treat there. She understands, but this would be her second choice to leaving today and going to her regular seat. She understands the need to have treatment tomorrow, since she has not treated since Monday. She states no issue with that.  Navigator spoke with CM/B. Graves-Bigelow to see if [facility] can be arranged for discharge today. Ortho and Attending are in agreement for discharge today. Renal Navigator will request that Renal PA send orders to outpatient clinic for treatment tomorrow.  Alphonzo Cruise, [place] Renal Navigator [phone]

## 2019-10-19 NOTE — Discharge Summary (Signed)
Rebekah Johnson JQB:[id number] DOB: 10-31-1960 DOA: 10/11/2019  PCP: Patient, No Pcp Per  Admit date: 10/11/2019  Discharge date: 10/19/2019  Admitted From: Home   disposition: Home   Recommendations for Outpatient Follow-up:   Make sure to keep your dialysis appointment tomorrow. Follow up with PCP in 1-2 weeks  Home Health: PT, OT and home health Equipment/Devices: Patient has cane and assistive devices at home Consultations: Orthopedics, nephrology Discharge Condition: Improved CODE STATUS: Full Diet Recommendation: Heart Healthy   Diet Order            Diet - low sodium heart healthy           Diet Carb Modified Fluid consistency: Thin; Room service appropriate? Yes  Diet effective now                  Chief Complaint  Patient presents with  . Fecal Impaction     Brief history of present illness from the day of admission and additional interim summary    Patient is a 59 year old African-American female with history of hypertension, hyperlipidemia, ESRD on hemodialysis MWF, coronary artery disease s/p stent placement, chronic diastolic congestive heart failure, diabetes mellitus with neuropathy, peripheral arterial disease and transmetatarsal left foot amputation presented to ED complaining of severe left foot pain and abdominal pain secondary to constipation. Patient is also complaining of rectal pain secondary to severe constipation. Patient also had stent placement to her distal popliteal artery secondary to severe peripheral vascular disease. Patient is on home opiates that resulted in severe constipation. She was disimpacted under sedation and discharged home with bowel regimen. In the ED CT scan of abdomen and pelvis showed proctitis and x-ray of left foot showed [facility] Course   Patient was evaluated by orthopedics who noted there were no further revascularization options available to her.  She had previously had a healed midfoot amputation on the left but has had progressive gangrenous changes.  Patient underwent a transtibial amputation/BKA on 10/18/2019.  Patient tolerated the procedure well.  The day after the procedure, patient was requesting to go home.  She was declining her hemodialysis as recommended by nephrology in house.  She stated she would like to go home and would have dialysis tomorrow at her home dialysis clinic.  Patient was seen by the renal navigator who have set up for her to have dialysis tomorrow.  Patient was seen by physical therapy who notes that she is able to go home with home PT and home health.  Patient has a wound VAC in place and orthopedics felt that she was able to go home with wound VAC instructions.  Status post left BKA BKA was secondary to progressive gangrenous changes after previously healed midfoot amputation. Patient tolerated procedure well No further antibiotics necessary per Dr. Sharol Given Patient has wound VAC at discharge, instructions will be provided by orthopedics and RN Patient to follow-up with orthopedics per their recommendations  Constipation with proctitis Improved after disimpaction under sedation Patient needs to avoid further constipation Patient is on aggressive bowel regimen including Linzess and lactulose. Patient states she understands the need to make sure she moves her bowels at least every other day  ESRD on HD Dialysis set up at her home clinic tomorrow per renal navigator Patient states she will definitely go to dialysis tomorrow Continue sevelamer Potassium of 3.2 can be managed during dialysis.  HTN Continue home medications without change including amlodipine, carvedilol and hydralazine  DM2 Continue home medications of glipizide and  sitagliptin  CAD Continue aspirin, Plavix and beta-blockers per home doses Continue ranolazine  HFpEF No evidence for decompensation Continue home medications and dialysis for fluid management    Discharge diagnosis     Active Problems:   Osteomyelitis of foot, acute ([place])   Non-pressure chronic ulcer of other part of left foot limited to breakdown of skin ([place])   Pyogenic inflammation of bone ([place])    Discharge instructions    Discharge Instructions    Call MD for:  redness, tenderness, or signs of infection (pain, swelling, redness, odor or green/yellow discharge around incision site)   Complete by: As directed    Diet - low sodium heart healthy   Complete by: As directed    Discharge instructions   Complete by: As directed    1.  I have requested physical therapy, home RN and home health aide to see you to make sure that your wound is healing well and that you will be safe moving around at home. 2.  Make sure you understand the wound VAC instructions before discharge. 3.  Make sure to see orthopedics for follow-up. 4.  Make sure to keep your hemodialysis appointment in the morning.   Discharge wound care:   Complete by: As directed    Orthopedics, wound VAC instructions as noted above.   Increase activity slowly   Complete by: As directed    Negative Pressure Wound Therapy - Incisional   Complete by: As directed    Show patient how to attach vac. Should call office if vac beeps or doesn't work      Discharge Medications   Allergies as of 10/19/2019   No Known Allergies     Medication List    STOP taking these medications   acetaminophen 500 MG tablet Commonly known as: TYLENOL   doxycycline 100 MG tablet Commonly known as: VIBRA-TABS     TAKE these medications   Accu-Chek Aviva device Use as instructed three times daily. What changed: additional instructions   accu-chek softclix lancets Use as instructed three times daily before meals.    amLODipine 5 MG tablet Commonly known as: NORVASC Take 1 tablet (5 mg total) by mouth daily.   aspirin 81 MG EC tablet Take 1 tablet (81 mg total) by mouth daily. Reported on 07/13/2015   atorvastatin 80 MG tablet Commonly known as: LIPITOR Take 1 tablet (80 mg total) by mouth daily.   carvedilol 25 MG tablet Commonly known as: COREG Take 1 tablet (25 mg total) by mouth 2 (two) times daily.   cholecalciferol 25 MCG (1000 UNIT) tablet Commonly known as: VITAMIN D3 Take 1,000 Units by  mouth daily.   clopidogrel 75 MG tablet Commonly known as: PLAVIX Take 1 tablet (75 mg total) by mouth daily.   docusate sodium 100 MG capsule Commonly known as: COLACE Take 200 mg by mouth 2 (two) times daily.   Fish Oil 1000 MG Caps Take 1,000 mg by mouth daily.   furosemide 80 MG tablet Commonly known as: LASIX Take 80 mg by mouth 2 (two) times daily.   gabapentin 300 MG capsule Commonly known as: NEURONTIN Take 1 capsule (300 mg total) by mouth 3 (three) times daily.   glipiZIDE 5 MG tablet Commonly known as: GLUCOTROL Take 0.5 tablets (2.5 mg total) by mouth daily before breakfast. What changed: how much to take   glucose blood test strip Commonly known as: Accu-Chek Aviva Use as instructed three times daily before meals.   hydrALAZINE 50 MG tablet Commonly known as: APRESOLINE Take 50 mg by mouth 3 (three) times daily.   HYDROcodone-acetaminophen 5-325 MG tablet Commonly known as: NORCO/VICODIN Take 1 tablet by mouth every 6 (six) hours as needed for moderate pain.   isosorbide mononitrate 30 MG 24 hr tablet Commonly known as: IMDUR Take 60 mg by mouth daily.   lactulose 10 GM/15ML solution Commonly known as: CHRONULAC Take 15 mLs (10 g total) by mouth 3 (three) times daily.   latanoprost 0.005 % ophthalmic solution Commonly known as: XALATAN Place 1 drop into both eyes at bedtime.   linaclotide 145 MCG Caps capsule Commonly known as: Linzess Take 1 capsule (145  mcg total) by mouth daily before breakfast. Take ~ 30 minutes before breakfast, on an empty stomach   nitroGLYCERIN 0.4 MG SL tablet Commonly known as: NITROSTAT Place 1 tablet (0.4 mg total) under the tongue every 5 (five) minutes as needed for chest pain.   nitroGLYCERIN 0.2 mg/hr patch Commonly known as: NITRODUR - Dosed in mg/24 hr Place 1 patch (0.2 mg total) onto the skin daily.   oxyCODONE-acetaminophen 5-325 MG tablet Commonly known as: PERCOCET/ROXICET Take 1 tablet by mouth every 4 (four) hours as needed for severe pain.   ranolazine 500 MG 12 hr tablet Commonly known as: RANEXA Take 1 tablet by mouth twice daily   sevelamer carbonate 800 MG tablet Commonly known as: RENVELA Take 800 mg by mouth See admin instructions. Three times daily with meals and with snack.   silver sulfADIAZINE 1 % cream Commonly known as: SILVADENE Apply 1 application topically daily. Apply to affected area daily plus dry dressing What changed:   when to take this  reasons to take this  additional instructions   sitaGLIPtin 25 MG tablet Commonly known as: JANUVIA Take 1 tablet (25 mg total) by mouth daily.   vitamin C 1000 MG tablet Take 1,000 mg by mouth daily.   Zofran 4 MG tablet Generic drug: ondansetron Take 4 mg by mouth every 8 (eight) hours as needed for nausea/vomiting.            Discharge Care Instructions  (From admission, onward)         Start     Ordered   10/19/19 0000  Discharge wound care:       Comments: Orthopedics, wound VAC instructions as noted above.   10/19/19 1340           Follow-up Information    Persons, Bevely Palmer, PA In 1 week.   Specialty: Orthopedic Surgery Contact information: [address] [place] [address] [phone]  Major procedures and Radiology Reports - PLEASE review detailed and final reports thoroughly  -       DG Abd 1 View  Result Date: 10/15/2019 CLINICAL DATA:  Abdominal pain. EXAM:  ABDOMEN - 1 VIEW COMPARISON:  None. FINDINGS: Gas is demonstrated within nondilated loops of large and small bowel in a nonobstructed pattern. Contrast within the rectum. Supine evaluation limited for the detection of free intraperitoneal air. Lumbar spine degenerative changes. IMPRESSION: Nonobstructed bowel gas pattern. Electronically Signed   By: Lovey Newcomer M.D.   On: 10/15/2019 15:48   DG Abdomen 1 View  Result Date: 10/09/2019 CLINICAL DATA:  Abdomen pain EXAM: ABDOMEN - 1 VIEW COMPARISON:  03/17/2019 FINDINGS: Scattered bowel gas is present without definitive air distension of small bowel. Radiopaque material within the rectosigmoid colon. No radiopaque calculi. IMPRESSION: Overall nonobstructed gas pattern. There is radiopaque material in the rectosigmoid colon Electronically Signed   By: Donavan Foil M.D.   On: 10/09/2019 16:22   CT Abdomen Pelvis W Contrast  Result Date: 10/11/2019 CLINICAL DATA:  Abdominal and rectal pain. EXAM: CT ABDOMEN AND PELVIS WITH CONTRAST TECHNIQUE: Multidetector CT imaging of the abdomen and pelvis was performed using the standard protocol following bolus administration of intravenous contrast. CONTRAST:  75 mL OMNIPAQUE IOHEXOL 300 MG/ML  SOLN COMPARISON:  CT abdomen and pelvis 03/21/2019. FINDINGS: Lower chest: Lung bases clear. No pleural or pericardial effusion. Extensive calcific coronary atherosclerosis noted. Hepatobiliary: No focal liver abnormality is seen. No gallstones, gallbladder wall thickening, or biliary dilatation. Pancreas: Unremarkable. No pancreatic ductal dilatation or surrounding inflammatory changes. Spleen: Normal in size without focal abnormality. Adrenals/Urinary Tract: Adrenal glands are unremarkable. Kidneys are normal, without renal calculi, focal lesion, or hydronephrosis. Bladder is unremarkable. Stomach/Bowel: There is a large volume of stool in the rectosigmoid colon. Walls of the rectum are thickened without focal abnormality. No  pneumatosis, free air or portal venous gas. The stomach, small bowel and appendix appear normal. Vascular/Lymphatic: Aortic atherosclerosis. No enlarged abdominal or pelvic lymph nodes. Reproductive: Status post hysterectomy. No adnexal masses. Other: Negative. Musculoskeletal: No acute or focal bony abnormality. IMPRESSION: Findings compatible with proctitis. Large volume of stool in the rectosigmoid colon noted. Aortic Atherosclerosis (ICD10-I70.0). Extensive calcific coronary atherosclerosis also noted. Electronically Signed   By: Inge Rise M.D.   On: 10/11/2019 14:36   PERIPHERAL VASCULAR CATHETERIZATION  Result Date: 09/25/2019 Patient name: PAKOU RAINBOW MRN: [id number] DOB: 29-Jan-1960 Sex: female 09/25/2019 Pre-operative Diagnosis: Critical left lower extremity with wound Post-operative diagnosis:  Same Surgeon:  Erlene Quan C. Donzetta Matters, MD Procedure Performed: 1.  Ultrasound-guided cannulation right common femoral artery 2.  Aortogram with bilateral lower extremity runoff 3.  Laser atherectomy left SFA popliteal arteries 4.  Drug-coated balloon angioplasty left SFA and popliteal arteries with 5 mm distal and 6 mm proximal Ranger 5.  Stent of distal popliteal artery with 5 x 60 mm Gore Tigris 6.  Moderate sedation with fentanyl and Versed 404 minutes 7.  Minx device closure right common femoral artery Indications: 59 year old female with previous left SFA stenting.  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  She has been placed on antibiotics.  She is followed by Dr. Sharol Given.  She is now indicated for angiography possible invention. Findings: Aorta and iliac segments are free of flow-limiting stenosis.  The right lower extremity has subtotal occlusion multiple areas of greater than 50% of the SFA and the posterior tibial artery is the dominant runoff to the foot.  Left lower extremity is the site of interest.  The proximal SFA has approximately 80% stenosis  and after intervention this is reduced to 0% with no stenosis or dissection.  The entirety level of the previously placed stents have at least 50% stenosis and distally there occluded for approximately 60 mm.  After laser atherectomy drug-coated balloon angioplasty the majority of the stents have no residual stenosis or dissection.  Distally we still had 1 area this was stented primarily with 5 x 60 mm Tigris there was no residual stenosis there.  Dominant runoff is to the posterior tibial artery fills the foot there is no stenosis in that vessel.  The anterior tibial artery is occluded does reconstitute distally.  Peroneal artery is not identified.  Procedure:  The patient was identified in the holding area and taken to room 8.  The patient was then placed supine on the table and prepped and draped in the usual sterile fashion.  A time out was called.  Ultrasound was used to evaluate the right common femoral artery was noted to be heavily calcified.  The area was anesthetized with 1% lidocaine cannulated with direct ultrasound visualization a micropuncture needle followed the wire and sheath.  And images saved the permanent record.  We placed a Bentson wire followed by 5 French sheath to the level of L1 performed aortogram followed by lower extremity runoff.  With the above findings we then crossed the bifurcation.  We placed a long 6 French sheath into the left common femoral artery.  Patient was fully heparinized.  We Glidewire advantage quick cross catheter to get all the way to the level of posterior tibial artery and confirmed intraluminal access.  We then began with laser atherectomy of the proximal SFA and all the way through the in-stent stenoses in the distal occluded segment.  This did have aspiration attached to it.  Ultimately we lost about 200 cc of blood.  We then began with balloon angioplasty of the entire to the SFA and popliteal segment with 5 mm balloon.  Completion demonstrated some stenosis  distally.  We did use drug-coated balloon angioplasty of the entire segment with a 5 distal and 6 proximal for total of approximately 400 cm.  We still had stenosis distally we elected to stent this primarily postdilated with 5 mm balloon.  At completion with no residual stenosis or dissection.  There was brisk flow to the posterior tibial this was confirmed with Doppler.  Satisfied we exchanged for short 6 [place] sheath deployed a minx device.  She tolerated procedure without any complication. EBL: 200 cc Contrast: 140 cc Brandon C. Donzetta Matters, MD [facility] Office: [phone] Pager: [phone]   DG Foot 2 Views Left  Result Date: 10/11/2019 CLINICAL DATA:  Lateral left foot ulcer, pain EXAM: LEFT FOOT - 2 VIEW COMPARISON:  07/07/2017 FINDINGS: Patient is status post transmetatarsal amputation of the left first-fifth rays. Subtle cortical erosion of the proximal aspect of the fifth metatarsal base where there is focal osteopenia. There is soft tissue swelling and air within the adjacent lateral and plantar soft tissues suggesting ulceration. No additional sites of bony erosion are identified. Prominent vascular calcifications. IMPRESSION: 1. Subtle cortical erosion of the proximal aspect of the fifth metatarsal base highly suspicious for acute osteomyelitis. 2. Soft tissue swelling and air within the adjacent lateral and plantar soft tissues suggesting ulceration. Soft tissue infection with a gas-forming organism is not excluded. Electronically Signed   By: Davina Poke D.O.  On: 10/11/2019 13:11   VAS [place] LOWER EXTREMITY ARTERIAL DUPLEX  Result Date: 10/15/2019 LOWER EXTREMITY ARTERIAL DUPLEX STUDY Indications: Gangrene, and peripheral artery disease. High Risk Factors: Hypertension, hyperlipidemia, Diabetes, past history of                    smoking: 42 pack year history.  Current ABI: Not obtained Comparison Study: 09/15/2019- lower extremity arterial duplex Performing  Technologist: Maudry Mayhew MHA, RDMS, RVT, RDCS  Examination Guidelines: A complete evaluation includes B-mode imaging, spectral Doppler, color Doppler, and power Doppler as needed of all accessible portions of each vessel. Bilateral testing is considered an integral part of a complete examination. Limited examinations for reoccurring indications may be performed as noted.  +-----------+--------+-----+---------------+----------+--------+ LEFT       PSV cm/sRatioStenosis       Waveform  Comments +-----------+--------+-----+---------------+----------+--------+ CFA Distal 81                          monophasic         +-----------+--------+-----+---------------+----------+--------+ DFA        82                          monophasic         +-----------+--------+-----+---------------+----------+--------+ SFA Prox   180          30-49% stenosismonophasic         +-----------+--------+-----+---------------+----------+--------+ SFA Mid    118                         monophasic         +-----------+--------+-----+---------------+----------+--------+ SFA Distal 90                          monophasic         +-----------+--------+-----+---------------+----------+--------+ POP Prox   64                          monophasic         +-----------+--------+-----+---------------+----------+--------+ POP Distal 63                          monophasic         +-----------+--------+-----+---------------+----------+--------+ TP Trunk   104                         monophasic         +-----------+--------+-----+---------------+----------+--------+ ATA Prox   15                          monophasic         +-----------+--------+-----+---------------+----------+--------+ ATA Mid    33                          monophasic         +-----------+--------+-----+---------------+----------+--------+ ATA Distal 42                          monophasic          +-----------+--------+-----+---------------+----------+--------+ PTA Prox   57                          monophasic         +-----------+--------+-----+---------------+----------+--------+  PTA Mid    57                          monophasic         +-----------+--------+-----+---------------+----------+--------+ PTA Distal 53                          monophasic         +-----------+--------+-----+---------------+----------+--------+ PERO Prox  34                          monophasic         +-----------+--------+-----+---------------+----------+--------+ PERO Mid   20                          monophasic         +-----------+--------+-----+---------------+----------+--------+ PERO Distal45                          monophasic         +-----------+--------+-----+---------------+----------+--------+ DP         49                          monophasic         +-----------+--------+-----+---------------+----------+--------+  Summary: Left: 30-49% stenosis noted in the superficial femoral artery. Patent left popliteal artery stent. Three vessel monophasic runoff.  See table(s) above for measurements and observations. Electronically signed by Servando Snare MD on 10/15/2019 at 11:32:44 AM.    Final     Micro Results    Recent Results (from the past 240 hour(s))  Blood culture (routine x 2)     Status: None   Collection Time: 10/11/19 12:30 PM   Specimen: BLOOD RIGHT ARM  Result Value Ref Range Status   Specimen Description BLOOD RIGHT ARM  Final   Special Requests   Final    BOTTLES DRAWN AEROBIC AND ANAEROBIC Blood Culture results may not be optimal due to an inadequate volume of blood received in culture bottles   Culture   Final    NO GROWTH 5 DAYS Performed at [facility], [place] [address] [place], [place] [address]    Report Status 10/16/2019 FINAL  Final  Blood culture (routine x 2)     Status: None   Collection Time: 10/11/19 12:45 PM   Specimen:  BLOOD  Result Value Ref Range Status   Specimen Description BLOOD LEFT ANTECUBITAL  Final   Special Requests   Final    BOTTLES DRAWN AEROBIC AND ANAEROBIC Blood Culture adequate volume   Culture   Final    NO GROWTH 5 DAYS Performed at [facility], [place] [address] [place],  [address]    Report Status 10/16/2019 FINAL  Final  Resp Panel by RT PCR (RSV, Flu A&B, Covid) - Nasopharyngeal Swab     Status: None   Collection Time: 10/11/19  4:00 PM   Specimen: Nasopharyngeal Swab  Result Value Ref Range Status   SARS Coronavirus 2 by RT PCR NEGATIVE NEGATIVE Final    Comment: (NOTE) SARS-CoV-2 target nucleic acids are NOT DETECTED.  The SARS-CoV-2 RNA is generally detectable in upper respiratoy specimens during the acute phase of infection. The lowest concentration of SARS-CoV-2 viral copies this assay can detect is 131 copies/mL. A negative result does not preclude SARS-Cov-2 infection  and should not be used as the sole basis for treatment or other patient management decisions. A negative result may occur with  improper specimen collection/handling, submission of specimen other than nasopharyngeal swab, presence of viral mutation(s) within the areas targeted by this assay, and inadequate number of viral copies (<131 copies/mL). A negative result must be combined with clinical observations, patient history, and epidemiological information. The expected result is Negative.  Fact Sheet for Patients:  PinkCheek.be  Fact Sheet for Healthcare Providers:  GravelBags.it  This test is no t yet approved or cleared by the [place] FDA and  has been authorized for detection and/or diagnosis of SARS-CoV-2 by FDA under an Emergency Use Authorization (EUA). This EUA will remain  in effect (meaning this test can be used) for the duration of the COVID-19 declaration under Section 564(b)(1) of the Act, 21 U.S.C. section  360bbb-3(b)(1), unless the authorization is terminated or revoked sooner.     Influenza A by PCR NEGATIVE NEGATIVE Final   Influenza B by PCR NEGATIVE NEGATIVE Final    Comment: (NOTE) The Xpert Xpress SARS-CoV-2/FLU/RSV assay is intended as an aid in  the diagnosis of influenza from Nasopharyngeal swab specimens and  should not be used as a sole basis for treatment. Nasal washings and  aspirates are unacceptable for Xpert Xpress SARS-CoV-2/FLU/RSV  testing.  Fact Sheet for Patients: PinkCheek.be  Fact Sheet for Healthcare Providers: GravelBags.it  This test is not yet approved or cleared by the [place] FDA and  has been authorized for detection and/or diagnosis of SARS-CoV-2 by  FDA under an Emergency Use Authorization (EUA). This EUA will remain  in effect (meaning this test can be used) for the duration of the  Covid-19 declaration under Section 564(b)(1) of the Act, 21  U.S.C. section 360bbb-3(b)(1), unless the authorization is  terminated or revoked.    Respiratory Syncytial Virus by PCR NEGATIVE NEGATIVE Final    Comment: (NOTE) Fact Sheet for Patients: PinkCheek.be  Fact Sheet for Healthcare Providers: GravelBags.it  This test is not yet approved or cleared by the [place] FDA and  has been authorized for detection and/or diagnosis of SARS-CoV-2 by  FDA under an Emergency Use Authorization (EUA). This EUA will remain  in effect (meaning this test can be used) for the duration of the  COVID-19 declaration under Section 564(b)(1) of the Act, 21 U.S.C.  section 360bbb-3(b)(1), unless the authorization is terminated or  revoked. Performed at [facility], [place] [address] [place], [place] [address]     Today   Subjective    Dorthia Tout is very eager to go home.  States she can manage at home and does not feel the need to be here  any longer.  Promises to go to dialysis in the morning.  Promises to keep her orthopedic follow-up appointment as scheduled.   Objective   Blood pressure 127/72, pulse 84, temperature 98 F (36.7 C), temperature source Oral, resp. rate 15, height 5' 4.02" (1.626 m), weight 66.6 kg, SpO2 100 %.   Intake/Output Summary (Last 24 hours) at 10/19/2019 1341 Last data filed at 10/19/2019 0000 Gross per 24 hour  Intake 1108.74 ml  Output --  Net 1108.74 ml    Exam General: Patient appears well and in good spirits sitting up in bed in no acute distress.  Eyes: sclera anicteric, conjuctiva mild injection bilaterally CVS: S1-S2, regular  Respiratory:  decreased air entry bilaterally secondary to decreased inspiratory effort, rales at bases  GI: NABS,  soft, NT  LE: No edema.  Neuro: A/O x 3, Moving all extremities equally with normal strength, CN 3-12 intact, grossly nonfocal.  Psych: patient is logical and coherent, judgement and insight appear normal, mood and affect appropriate to situation.    Data Review   CBC w Diff:  Lab Results  Component Value Date   WBC 9.4 10/16/2019   HGB 12.2 10/18/2019   HGB 12.9 04/27/2019   HGB 11.6 01/14/2017   HCT 36.0 10/18/2019   HCT 35.9 01/14/2017   PLT 278 10/16/2019   PLT 334 04/27/2019   PLT 308 01/14/2017   LYMPHOPCT 21 10/16/2019   MONOPCT 13 10/16/2019   EOSPCT 1 10/16/2019   BASOPCT 1 10/16/2019    CMP:  Lab Results  Component Value Date   NA 136 10/18/2019   NA 141 10/07/2017   K 3.2 (L) 10/18/2019   CL 94 (L) 10/18/2019   CO2 26 10/16/2019   BUN 16 10/18/2019   BUN 33 (H) 10/07/2017   CREATININE 5.00 (H) 10/18/2019   CREATININE 4.15 (HH) 04/27/2019   CREATININE 1.19 (H) 12/18/2015   PROT 6.6 10/12/2019   PROT 6.7 10/07/2017   ALBUMIN 2.2 (L) 10/16/2019   ALBUMIN 4.0 10/07/2017   BILITOT 0.2 (L) 10/12/2019   BILITOT 0.3 04/27/2019   ALKPHOS 93 10/12/2019   AST 14 (L) 10/12/2019   AST 14 (L) 04/27/2019   ALT 8  10/12/2019   ALT 14 04/27/2019  .   Total Time in preparing paper work, data evaluation and todays exam - 35 minutes  Vashti Hey M.D on 10/19/2019 at 1:41 PM  [facility]   Office  [phone]

## 2019-10-19 NOTE — Progress Notes (Signed)
Patient refused hemodialysis treatment on unit after set up. Larina Earthly, PA aware.

## 2019-10-19 NOTE — Progress Notes (Signed)
Patient is postop day 1 status post below-knee amputation.  She is sitting up in bed and appears well.  She wants to know when she will be able to go home.   Wound VAC is functioning with 2 green checks.   Status post above.  Discussed with her that we will have physical therapy work with her today and make recommendation with regards to discharge deposition.

## 2019-10-19 NOTE — Evaluation (Addendum)
Physical Therapy Evaluation Patient Details Name: Rebekah Johnson MRN: [id number] DOB: 1960/11/20 Today's Date: 10/19/2019   History of Present Illness  59 yo admitted with left foot osteo s/p BKA on 10/13. PMhx: HTN, HLD, ESRD MWF, DM, PAD, left transmet in 2017  Clinical Impression  Pt pleasant and reporting pain despite medication but able to transition to EOB and OOB to chair with increased time. Pt with good strength but limited ROM of LLE with pt maintaining knee flexion. Pt educated for hip and knee extension to progress toward prosthesis but unable to tolerate at this time. Pt educated for limb protector donning and place on in sitting. Pt with decreased activity tolerance, balance, strength and function who will benefit from acute therapy to maximize mobility, safety and independence to decrease burden of care. Pt has stairs to enter home and discussed having roommate and neighbor bump her up in the [facility].       Follow Up Recommendations Home health PT;Supervision/Assistance - 24 hour    Equipment Recommendations  None recommended by PT    Recommendations for Other Services OT consult     Precautions / Restrictions Precautions Precautions: Fall Precaution Comments: Left BKA Required Braces or Orthoses: Other Brace Other Brace: limb protector      Mobility  Bed Mobility Overal bed mobility: Needs Assistance Bed Mobility: Supine to Sit     Supine to sit: HOB elevated;Min guard     General bed mobility comments: cues for sequence, reliance on rail and HOB 30 degrees to pivot to right side  Transfers Overall transfer level: Needs assistance   Transfers: Sit to/from Stand;Stand Pivot Transfers Sit to Stand: Min guard Stand pivot transfers: Min guard       General transfer comment: cues for hand placement with pt able to rise from surface without physical assist x 2 trials. Pt used RW to pivot from bed to recliner  Ambulation/Gait                Stairs             Wheelchair Mobility    Modified Rankin (Stroke Patients Only)       Balance Overall balance assessment: Mild deficits observed, not formally tested                                           Pertinent Vitals/Pain Pain Assessment: 0-10 Pain Score: 8  Pain Location: LLE Pain Descriptors / Indicators: Aching;Guarding;Constant;Spasm Pain Intervention(s): Limited activity within patient's tolerance;Monitored during session;Premedicated before session;Repositioned;Patient requesting pain meds-RN notified    Home Living Family/patient expects to be discharged to:: Private residence Living Arrangements: Non-relatives/Friends Available Help at Discharge: Family;Available 24 hours/day Type of Home: House Home Access: Stairs to enter   [employer] of Steps: 3 Home Layout: One level Home Equipment: Walker - 2 wheels;Bedside commode;Wheelchair - manual;Tub bench;Walker - 4 wheels;Cane - single point      Prior Function Level of Independence: Independent               Hand Dominance        Extremity/Trunk Assessment   Upper Extremity Assessment Upper Extremity Assessment: Overall WFL for tasks assessed    Lower Extremity Assessment Lower Extremity Assessment: LLE deficits/detail LLE Deficits / Details: decreased knee extension maintaining grossly 10 degrees of flexion throughout    Cervical / Trunk Assessment Cervical / Trunk Assessment:  Normal  Communication   Communication: No difficulties  Cognition Arousal/Alertness: Awake/alert Behavior During Therapy: WFL for tasks assessed/performed Overall Cognitive Status: Within Functional Limits for tasks assessed                                        General Comments      Exercises     Assessment/Plan    PT Assessment Patient needs continued PT services  PT Problem List Decreased strength;Decreased mobility;Decreased activity tolerance;Decreased  knowledge of use of DME;Pain;Decreased skin integrity       PT Treatment Interventions Gait training;DME instruction;Therapeutic exercise;Functional mobility training;Therapeutic activities;Patient/family education    PT Goals (Current goals can be found in the Care Plan section)  Acute Rehab PT Goals Patient Stated Goal: go home PT Goal Formulation: With patient Time For Goal Achievement: 11/02/19 Potential to Achieve Goals: Good    Frequency Min 4X/week   Barriers to discharge Decreased caregiver support      Co-evaluation               AM-PAC PT "6 Clicks" Mobility  Outcome Measure Help needed turning from your back to your side while in a flat bed without using bedrails?: A Little Help needed moving from lying on your back to sitting on the side of a flat bed without using bedrails?: A Little Help needed moving to and from a bed to a chair (including a wheelchair)?: A Little Help needed standing up from a chair using your arms (e.g., wheelchair or bedside chair)?: A Little Help needed to walk in hospital room?: A Lot Help needed climbing 3-5 steps with a railing? : A Lot 6 Click Score: 16    End of Session Equipment Utilized During Treatment: Gait belt;Other (comment) (limb protector) Activity Tolerance: Patient tolerated treatment well Patient left: in chair;with call bell/phone within reach;with chair alarm set Nurse Communication: Mobility status PT Visit Diagnosis: Other abnormalities of gait and mobility (R26.89);Difficulty in walking, not elsewhere classified (R26.2)    Time: 0349-1791 PT Time Calculation (min) (ACUTE ONLY): 33 min   Charges:   PT Evaluation $PT Eval Moderate Complexity: 1 Mod PT Treatments $Therapeutic Activity: 8-22 mins        Keyandre Pileggi P, PT Acute Rehabilitation Services Pager: [phone] Office: [phone]   Adar Rase B Martyn Timme 10/19/2019, 10:35 AM

## 2019-10-19 NOTE — TOC Initial Note (Signed)
Transition of Care [facility]) - Initial/Assessment Note    Patient Details  Name: Rebekah Johnson MRN: [id number] Date of Birth: 05-01-1960  [facility]) CM/SW Contact:    Bethena Roys, RN Phone Number: 10/19/2019, 2:34 PM  Clinical Narrative: Risk for readmission assessment completed. Case Manager received message that patient was ready to transition home. Patient has durable medical equipment (DME) rolling walker, rollator, and wheelchair in the home. Patient will not need any DME at this time. Case Manager offered patient choice and she chose [facility] and they could not accept the patient; did not have nursing staff. Case Manager called [facility] and they can provide PT/OT only. Patient has a [place] for home- Case Manager discussed with provider if we could get orders changed to PT/OT only since patient stated she can do her own bath. MD willing to write orders for PT/OT and [facility] can accept the patient. Start of care to begin within 24-48 hours post transition home. Patient states she has transportation home. No further needs from Case Manager at this time.                  Expected Discharge Plan: [place] Barriers to Discharge: No Barriers Identified   Patient Goals and CMS Choice Patient states their goals for this hospitalization and ongoing recovery are:: to return home CMS Medicare.gov Compare Post Acute Care list provided to:: Patient Choice offered to / list presented to : Patient  Expected Discharge Plan and Services Expected Discharge Plan: [place] In-house Referral: NA Discharge Planning Services: CM Consult Post Acute Care Choice: [place] arrangements for the past 2 months: Single Family Home Expected Discharge Date: 10/19/19               DME Arranged: N/A   HH Arranged: PT, OT HH Agency: [place] Date [facility] Agency Contacted: 10/19/19 Time HH Agency Contacted:  [phone] Representative spoke with at [place]: Tommi Rumps  Prior Living Arrangements/Services Living arrangements for the past 2 months: [place] with:: Roommate Patient language and need for interpreter reviewed:: Yes Do you feel safe going back to the place where you live?: Yes      Need for Family Participation in Patient Care: Yes (Comment) Care giver support system in place?: Yes (comment) Current home services: DME (Patient has RW, rollator, wheelchair.)    Activities of Daily Living Home Assistive Devices/Equipment: None ADL Screening (condition at time of admission) Patient's cognitive ability adequate to safely complete daily activities?: Yes Is the patient deaf or have difficulty hearing?: No Does the patient have difficulty seeing, even when wearing glasses/contacts?: No Does the patient have difficulty concentrating, remembering, or making decisions?: No Patient able to express need for assistance with ADLs?: Yes Does the patient have difficulty dressing or bathing?: No Independently performs ADLs?: Yes (appropriate for developmental age) Does the patient have difficulty walking or climbing stairs?: Yes Weakness of Legs: Both Weakness of Arms/Hands: None  Permission Sought/Granted Permission sought to share information with : Family Supports, Case Freight forwarder, Investment banker, corporate granted to share info w AGENCY: [facility]        Emotional Assessment Appearance:: Appears stated age Attitude/Demeanor/Rapport: Engaged Affect (typically observed): Appropriate Orientation: : Oriented to Situation, Oriented to  Time, Oriented to Place, Oriented to Self Alcohol / Substance Use: Not Applicable Psych Involvement: No (comment)  Admission diagnosis:  Proctitis [K62.89] Osteomyelitis  of foot, acute ([place]) [M86.179] Other osteomyelitis of left foot [facility]) [M86.8X7] Patient Active Problem List   Diagnosis Date Noted  . Pyogenic inflammation of  bone ([place])   . Non-pressure chronic ulcer of other part of left foot limited to breakdown of skin ([place])   . Osteomyelitis of foot, acute ([place]) 10/11/2019  . Cutaneous abscess of left foot 10/04/2019  . Allergy, unspecified, initial encounter 08/07/2019  . Anaphylactic shock, unspecified, initial encounter 08/07/2019  . Constipation 08/04/2019  . Nausea without vomiting 08/04/2019  . Mild protein-calorie malnutrition ([place]) 11/29/2018  . Anemia in chronic kidney disease 10/04/2018  . Coagulation defect, unspecified ([place]) 10/04/2018  . Iron deficiency anemia, unspecified 10/04/2018  . Secondary hyperparathyroidism of renal origin ([place]) 10/04/2018  . Multinodular goiter 08/31/2018  . History of influenza 01/08/2018  . Cardiac arrest ([place]) 11/22/2017  . DOE (dyspnea on exertion) 09/01/2017  . COPD GOLD  0 08/31/2017  . Diabetic polyneuropathy associated with type 2 diabetes mellitus ([place]) 08/19/2017  . ESRD (end stage renal disease) on dialysis ([place]) 03/31/2017  . Neovascular glaucoma of right eye, moderate stage 03/31/2017  . NSTEMI (non-ST elevated myocardial infarction) ([place]) 01/01/2017  . Chest pain 01/01/2017  . Tobacco abuse   . PVD (peripheral vascular disease) ([place])   . Proteinuria   . Obesity   . Normocytic anemia   . Hypertension   . High cholesterol   . Foot amputation status   . Diabetes mellitus with neuropathy ([place])   . CKD (chronic kidney disease), stage IV ([place])   . Chronic diastolic CHF (congestive heart failure) ([place])   . CAD (coronary artery disease) 10/27/2016  . Chronic kidney disease 09/23/2016  . Acute congestive heart failure ([place])   . Hypertensive heart disease with heart failure ([place])   . Chronic combined systolic and diastolic heart failure ([place]) 09/14/2016  . Onychomycosis 12/24/2015  . Status post transmetatarsal amputation of foot, left ([place]) 09/06/2015  . Diabetic foot ulcer ([place]) 07/13/2015  . Cellulitis 07/12/2015  . HLD (hyperlipidemia)  09/02/2012  . Peripheral nerve disease 09/02/2012  . Type 2 diabetes mellitus with peripheral angiopathy ([place]) 09/02/2012  . Hypertriglyceridemia 07/28/2012  . Peripheral vascular disease ([place]) 07/28/2012  . Avitaminosis D 07/28/2012  . Essential (primary) hypertension 06/10/2012  . History of biliary T-tube placement 06/10/2012  . Leg pain 06/10/2012  . Current tobacco use 06/10/2012  . Atrophic vaginitis 06/07/2012  . Type 2 diabetes mellitus ([place]) 06/07/2012   PCP:  Patient, No Pcp Per Pharmacy:   [facility], [place] [address] [place] [address] Phone: [phone] Fax: [phone]   Readmission Risk Interventions Readmission Risk Prevention Plan 10/19/2019  Transportation Screening Complete  PCP or Specialist Appt within 3-5 Days Complete  HRI or Home Care Consult Complete  Social Work Consult for [place] Planning/Counseling Complete  Palliative Care Screening Not Applicable  Medication Review Press photographer) Complete  Some recent data might be hidden

## 2019-10-20 ENCOUNTER — Telehealth: Payer: Self-pay | Admitting: Orthopedic Surgery

## 2019-10-20 ENCOUNTER — Other Ambulatory Visit: Payer: Self-pay | Admitting: Physician Assistant

## 2019-10-20 MED ORDER — OXYCODONE-ACETAMINOPHEN 5-325 MG PO TABS
1.0000 | ORAL_TABLET | ORAL | 0 refills | Status: DC | PRN
Start: 2019-10-20 — End: 2020-01-15

## 2019-10-20 NOTE — Telephone Encounter (Signed)
Dhavin ( physical Therapist) from [facility] called with emergency verbal orders for a nurse to be sent to patient home to check stump for infection. Dhavin states amputeed stump is not looking well and need a nurse to look at. Dhavin states patient does does not wish to go to the hospital to be seen. He states patient states stump I not hurting. Dhavin need a call back as soon as possible and orders sent for home health nurse to visit patient. Dhavin phone number is [phone].

## 2019-10-20 NOTE — Telephone Encounter (Signed)
done

## 2019-10-20 NOTE — Telephone Encounter (Signed)
This pt is s/p a BKA 10/18/19 and was not sent home with rx for pain. Last refill by narcotic data base was 09/15/19 for oxycodone 5/325 #30 can you please write hard copy rx pt is aware this will be a front desk for pick up.   I called and sw the PT and he states that the pt still has on a wound vac and that she is complaining of pain, she is not elevating her leg which we both council the pt on the importance of this. She also did not have rx for pain. Made an appt for her to come in on Tuesday for vac removal. PT offered referral for social worker to discuss transportation and other community based programs that she may qualify for. The pt is a bit withdrawn and was not interested in any assistance at this time. Will continue to encourage otherwise she will come in for visit on Tuesday PT had no c/o limb looking infected or any change at all.

## 2019-10-21 LAB — SURGICAL PATHOLOGY

## 2019-10-21 NOTE — Telephone Encounter (Signed)
Transition of care contact from inpatient facility  Date of Discharge:10/19/19  Date of Contact:10/21/19 attempted  Left message as below  Method of contact: Phone  Attempted to contact patient to discuss transition of care from inpatient admission. Patient did not answer the phone. Message was left on the patient's voicemail with call back number [phone].

## 2019-10-22 NOTE — Telephone Encounter (Signed)
Transition of care contact from inpatient facility  Date of discharge: 10/19/19 Date of contact: 10/22/19 Method: Phone Spoke to: Patient  Patient contacted to discuss transition of care from recent inpatient hospitalization. Patient was admitted to [facility] from 10/11/19 - 10/19/19 ... with discharge diagnosis of ..L bka ( gangrenous changes) Prior Midfoot amputation./ Proctitis complications,   Medication changes were reviewed./and  discussion about staying entire tx time even if only 0.5 kg >edw as she was on Friday 10/20/19 "signed out early= (thougth she did not need entire hd time)"

## 2019-10-24 ENCOUNTER — Encounter: Payer: Self-pay | Admitting: Family

## 2019-10-24 ENCOUNTER — Ambulatory Visit (INDEPENDENT_AMBULATORY_CARE_PROVIDER_SITE_OTHER): Payer: Medicare Other | Admitting: Family

## 2019-10-24 VITALS — Ht 64.0 in | Wt 146.0 lb

## 2019-10-24 DIAGNOSIS — Z89512 Acquired absence of left leg below knee: Secondary | ICD-10-CM

## 2019-10-24 NOTE — Progress Notes (Signed)
Post-Op Visit Note   Patient: Rebekah Johnson           Date of Birth: [birth year]           MRN: [id number] Visit Date: 10/24/2019 PCP: Patient, No Pcp Per  Chief Complaint:  Chief Complaint  Patient presents with  . Left Leg - Routine Post Op    10/18/19 left BKA     HPI:  HPI The patient is a 58 year old woman who presents 1 week status post left below-knee amputation. wound VAC removed today.  Ortho Exam Incision well approximated staples there is no gaping no drainage no surrounding erythema very minimal swelling  Visit Diagnoses:  1. Left below-knee amputee [facility])     Plan: Begin daily Dial soap cleansing dry dressing changes follow-up in the office in 2 weeks.  Follow-Up Instructions: Return in about 2 weeks (around 11/07/2019).   Imaging: No results found.  Orders:  No orders of the defined types were placed in this encounter.  No orders of the defined types were placed in this encounter.    PMFS History: Patient Active Problem List   Diagnosis Date Noted  . Left below-knee amputee ([place]) 10/24/2019  . Pyogenic inflammation of bone ([place])   . Non-pressure chronic ulcer of other part of left foot limited to breakdown of skin ([place])   . Osteomyelitis of foot, acute ([place]) 10/11/2019  . Cutaneous abscess of left foot 10/04/2019  . Allergy, unspecified, initial encounter 08/07/2019  . Anaphylactic shock, unspecified, initial encounter 08/07/2019  . Constipation 08/04/2019  . Nausea without vomiting 08/04/2019  . Mild protein-calorie malnutrition ([place]) 11/29/2018  . Anemia in chronic kidney disease 10/04/2018  . Coagulation defect, unspecified ([place]) 10/04/2018  . Iron deficiency anemia, unspecified 10/04/2018  . Secondary hyperparathyroidism of renal origin ([place]) 10/04/2018  . Multinodular goiter 08/31/2018  . History of influenza 01/08/2018  . Cardiac arrest ([place]) 11/22/2017  . DOE (dyspnea on exertion) 09/01/2017  . COPD GOLD  0 08/31/2017  . Diabetic  polyneuropathy associated with type 2 diabetes mellitus ([place]) 08/19/2017  . ESRD (end stage renal disease) on dialysis ([place]) 03/31/2017  . Neovascular glaucoma of right eye, moderate stage 03/31/2017  . NSTEMI (non-ST elevated myocardial infarction) ([place]) 01/01/2017  . Chest pain 01/01/2017  . Tobacco abuse   . PVD (peripheral vascular disease) ([place])   . Proteinuria   . Obesity   . Normocytic anemia   . Hypertension   . High cholesterol   . Foot amputation status   . Diabetes mellitus with neuropathy ([place])   . CKD (chronic kidney disease), stage IV ([place])   . Chronic diastolic CHF (congestive heart failure) ([place])   . CAD (coronary artery disease) 10/27/2016  . Chronic kidney disease 09/23/2016  . Acute congestive heart failure ([place])   . Hypertensive heart disease with heart failure ([place])   . Chronic combined systolic and diastolic heart failure ([place]) 09/14/2016  . Onychomycosis 12/24/2015  . Status post transmetatarsal amputation of foot, left ([place]) 09/06/2015  . Diabetic foot ulcer ([place]) 07/13/2015  . Cellulitis 07/12/2015  . HLD (hyperlipidemia) 09/02/2012  . Peripheral nerve disease 09/02/2012  . Type 2 diabetes mellitus with peripheral angiopathy ([place]) 09/02/2012  . Hypertriglyceridemia 07/28/2012  . Peripheral vascular disease ([place]) 07/28/2012  . Avitaminosis D 07/28/2012  . Essential (primary) hypertension 06/10/2012  . History of biliary T-tube placement 06/10/2012  . Leg pain 06/10/2012  . Current tobacco use 06/10/2012  . Atrophic vaginitis 06/07/2012  . Type 2 diabetes mellitus (  [place]) 06/07/2012   Past Medical History:  Diagnosis Date  . Acute congestive heart failure ([place])   . Acute diastolic CHF (congestive heart failure) ([place]) 09/14/2016  . Atrophic vaginitis 06/07/2012  . Avitaminosis D 07/28/2012  . CAD (coronary artery disease)    a. NSTEM 09/2016: cath showing severe diffuse disease of the RCA, ramus and Cx with mild-mod disease of LAD; PCI would require  multiple stents and significant contrast usage thus medical therapy recommended.  . Cellulitis 07/12/2015  . Chest pain 01/01/2017  . Chronic diastolic CHF (congestive heart failure) ([place])   . Chronic kidney disease 09/23/2016  . CKD (chronic kidney disease), stage IV ([place])   . Constipation   . COPD GOLD  0 08/31/2017   Quit smoking 07/17/17 - Spirometry 09/01/2017  FEV1 1.23 (58%)  Ratio 57 with mild curvature p no rx - 09/01/2017  After extensive coaching inhaler device,  effectiveness =    75% with elipta with cough provoked  - PFT's  10/14/2017  FEV1 1.69 (68 % ) ratio 84  p no % improvement from saba p nothing prior to study with DLCO  50 % corrects to 76  % for alv volume   - 10/14/2017  Walked RA x 3 laps @ 1  . Current tobacco use 06/10/2012  . Diabetes mellitus with neuropathy ([place])   . Diabetic foot ulcer ([place]) 07/13/2015  . Diabetic polyneuropathy associated with type 2 diabetes mellitus ([place]) 08/19/2017  . DOE (dyspnea on exertion) 09/01/2017   09/01/2017  Walked RA x 3 laps @ 185 ft each stopped due to  End of study, nl to mod fast  pace, no  desat   But stopped to rest x 2  Spirometry 09/01/2017  FEV1 1.23 (58%)  Ratio 57  > trial of anoro    . Dyspnea    with exertion  . ESRD (end stage renal disease) ([place])    MWF - [facility]  . Essential (primary) hypertension 06/10/2012  . Foot amputation status    a. h/o left foot transmetatarsal amputation  . Headache    Migraines  . High cholesterol   . History of biliary T-tube placement 06/10/2012   Patient unaware   . History of cardiac arrest   . HLD (hyperlipidemia) 09/02/2012   Overview:  ICD-10 cut over    . Hypertension    a. patent renal arteries by PV angio 08/2015. b. heavy proteinuria 09/2016 ? nephrotic.  [name] Hypertensive heart disease with heart failure ([place])   . Hypertriglyceridemia 07/28/2012  . Leg pain 06/10/2012  . Normocytic anemia    pt denies this dx  . NSTEMI (non-ST elevated myocardial infarction) ([place]) 01/01/2017  .  Obesity   . Onychomycosis 12/24/2015  . Peripheral nerve disease 09/02/2012   legs  . Peripheral vascular disease ([place]) 07/28/2012  . Pneumonia 2018  . Proteinuria   . PVD (peripheral vascular disease) ([place])    a. status post left SFA and popliteal stent and left SFA PTA with drug coated balloon in 08/2015.  . Status post transmetatarsal amputation of foot, left ([place]) 09/06/2015  . Tobacco abuse    quit cigarettes 2019, smokes marijuana  . Type 2 diabetes mellitus ([place]) 06/07/2012  . Type 2 diabetes mellitus with peripheral angiopathy ([place]) 09/02/2012    Family History  Problem Relation Age of Onset  . Diabetes Mother   . Hypertension Father   . Colon cancer Neg Hx   . Stomach cancer Neg Hx   . Pancreatic  cancer Neg Hx     Past Surgical History:  Procedure Laterality Date  . ABDOMINAL AORTOGRAM W/LOWER EXTREMITY Left 09/25/2019   Procedure: ABDOMINAL AORTOGRAM W/LOWER EXTREMITY;  Surgeon: Waynetta Sandy, MD;  Location: [facility];  Service: Cardiovascular;  Laterality: Left;  . ABDOMINAL HYSTERECTOMY    . AMPUTATION Left 09/06/2015   Procedure: LEFT TRANSMETATARSAL AMPUTATTION;  Surgeon: Newt Minion, MD;  Location: [facility];  Service: Orthopedics;  Laterality: Left;  . AMPUTATION Left 10/18/2019   Procedure: LEFT BELOW KNEE AMPUTATION;  Surgeon: Newt Minion, MD;  Location: [facility];  Service: Orthopedics;  Laterality: Left;  . AV FISTULA PLACEMENT Left 12/13/2018   Procedure: ARTERIOVENOUS (AV) FISTULA CREATION LEFT UPPER ARM;  Surgeon: Waynetta Sandy, MD;  Location: [facility];  Service: Vascular;  Laterality: Left;  . BASCILIC VEIN TRANSPOSITION Left 03/30/2019   Procedure: LEFT ARM ARTERIOVENOUS VEIN TRANSPOSITION;  Surgeon: Serafina Mitchell, MD;  [facility];  Service: Vascular;  Laterality: Left;  . COLONOSCOPY     polyps x 1  . LEFT HEART CATH AND CORONARY ANGIOGRAPHY N/A 09/17/2016   Procedure: LEFT HEART CATH AND CORONARY ANGIOGRAPHY;  Surgeon: Jettie Booze, MD;  Location: [facility];  Service: Cardiovascular;  Laterality: N/A;  . PERIPHERAL VASCULAR ATHERECTOMY Left 09/25/2019   Procedure: PERIPHERAL VASCULAR ATHERECTOMY;  Surgeon: Waynetta Sandy, MD;  Location: [facility];  Service: Cardiovascular;  Laterality: Left;  SFA  . PERIPHERAL VASCULAR CATHETERIZATION N/A 08/16/2015   Procedure: Abdominal Aortogram;  Surgeon: Elam Dutch, MD;  Location: [place] [facility];  Service: Cardiovascular;  Laterality: N/A;  . PERIPHERAL VASCULAR CATHETERIZATION Bilateral 08/16/2015   Procedure: Lower Extremity Angiography;  Surgeon: Elam Dutch, MD;  Location: [facility] [facility];  Service: Cardiovascular;  Laterality: Bilateral;  . PERIPHERAL VASCULAR CATHETERIZATION Left 08/16/2015   Procedure: Peripheral Vascular Intervention;  Surgeon: Elam Dutch, MD;  Location: [facility];  Service: Cardiovascular;  Laterality: Left;  SFA STENT X 2  . PERIPHERAL VASCULAR INTERVENTION Left 09/25/2019   Procedure: PERIPHERAL VASCULAR INTERVENTION;  Surgeon: Waynetta Sandy, MD;  Location: [facility];  Service: Cardiovascular;  Laterality: Left;  SFA   Social History   Occupational History  . Not on file  Tobacco Use  . Smoking status: Former Smoker    Packs/day: 1.00    Years: 42.00    Pack years: 42.00    Types: Cigarettes    Quit date: 07/17/2017    Years since quitting: 2.2  . Smokeless tobacco: Never Used  Vaping Use  . Vaping Use: Never used  Substance and Sexual Activity  . Alcohol use: Not Currently    Alcohol/week: 1.0 - 2.0 standard drink    Types: 1 - 2 Glasses of wine per week    Comment: on social occassions  . Drug use: Yes    Frequency: 7.0 times per week    Types: Marijuana    Comment: daily use - last use 03/28/19  . Sexual activity: Not on file    Comment: Hysterectomy

## 2019-10-31 ENCOUNTER — Encounter (HOSPITAL_COMMUNITY): Payer: Self-pay | Admitting: Vascular Surgery

## 2019-11-01 ENCOUNTER — Ambulatory Visit: Payer: Medicare Other | Admitting: Vascular Surgery

## 2019-11-01 ENCOUNTER — Encounter (HOSPITAL_COMMUNITY): Payer: Medicare Other

## 2019-11-03 ENCOUNTER — Encounter (HOSPITAL_COMMUNITY): Payer: Medicare Other

## 2019-11-07 ENCOUNTER — Ambulatory Visit (INDEPENDENT_AMBULATORY_CARE_PROVIDER_SITE_OTHER): Payer: Medicare Other | Admitting: Family

## 2019-11-07 ENCOUNTER — Encounter: Payer: Self-pay | Admitting: Family

## 2019-11-07 VITALS — Ht 64.0 in | Wt 146.0 lb

## 2019-11-07 DIAGNOSIS — Z89512 Acquired absence of left leg below knee: Secondary | ICD-10-CM

## 2019-11-07 NOTE — Progress Notes (Signed)
Post-Op Visit Note   Patient: Rebekah Johnson           Date of Birth: 07/26/1960           MRN: [id number] Visit Date: 11/07/2019 PCP: Patient, No Pcp Per  Chief Complaint:  Chief Complaint  Patient presents with  . Left Leg - Routine Post Op    10/18/19 left BKA     HPI:  HPI The patient is a 59 year old woman who presents 2-week status post left below-knee amputation she has been doing dry dressing changes.  Continues to complain of intense pain of her amputation stump.  She is having some dry heaves today in the lobby.  She states that she has had upset stomach for the last 3 days.  Denies fever chills cough. Ortho Exam On examination of the left residual limb this is well approximated with staples there is no gaping drainage erythema no edema  Oral temperature 97.1.  Visit Diagnoses:  1. Left below-knee amputee [facility])     Plan: Continue daily Dial soap cleansing.  Dry dressing changes.  She will follow-up in 2 more weeks for staple removal.  Discussed calling her primary care physician to discuss her nausea vomiting.  Follow-Up Instructions: Return in about 2 weeks (around 11/21/2019).   Imaging: No results found.  Orders:  No orders of the defined types were placed in this encounter.  No orders of the defined types were placed in this encounter.    PMFS History: Patient Active Problem List   Diagnosis Date Noted  . Left below-knee amputee ([place]) 10/24/2019  . Pyogenic inflammation of bone ([place])   . Non-pressure chronic ulcer of other part of left foot limited to breakdown of skin ([place])   . Osteomyelitis of foot, acute ([place]) 10/11/2019  . Cutaneous abscess of left foot 10/04/2019  . Allergy, unspecified, initial encounter 08/07/2019  . Anaphylactic shock, unspecified, initial encounter 08/07/2019  . Constipation 08/04/2019  . Nausea without vomiting 08/04/2019  . Mild protein-calorie malnutrition ([place]) 11/29/2018  . Anemia in chronic kidney disease  10/04/2018  . Coagulation defect, unspecified ([place]) 10/04/2018  . Iron deficiency anemia, unspecified 10/04/2018  . Secondary hyperparathyroidism of renal origin ([place]) 10/04/2018  . Multinodular goiter 08/31/2018  . History of influenza 01/08/2018  . Cardiac arrest ([place]) 11/22/2017  . DOE (dyspnea on exertion) 09/01/2017  . COPD GOLD  0 08/31/2017  . Diabetic polyneuropathy associated with type 2 diabetes mellitus ([place]) 08/19/2017  . ESRD (end stage renal disease) on dialysis ([place]) 03/31/2017  . Neovascular glaucoma of right eye, moderate stage 03/31/2017  . NSTEMI (non-ST elevated myocardial infarction) ([place]) 01/01/2017  . Chest pain 01/01/2017  . Tobacco abuse   . PVD (peripheral vascular disease) ([place])   . Proteinuria   . Obesity   . Normocytic anemia   . Hypertension   . High cholesterol   . Foot amputation status   . Diabetes mellitus with neuropathy ([place])   . CKD (chronic kidney disease), stage IV ([place])   . Chronic diastolic CHF (congestive heart failure) ([place])   . CAD (coronary artery disease) 10/27/2016  . Chronic kidney disease 09/23/2016  . Acute congestive heart failure ([place])   . Hypertensive heart disease with heart failure ([place])   . Chronic combined systolic and diastolic heart failure ([place]) 09/14/2016  . Onychomycosis 12/24/2015  . Status post transmetatarsal amputation of foot, left ([place]) 09/06/2015  . Diabetic foot ulcer ([place]) 07/13/2015  . Cellulitis 07/12/2015  . HLD (hyperlipidemia) 09/02/2012  .  Peripheral nerve disease 09/02/2012  . Type 2 diabetes mellitus with peripheral angiopathy ([place]) 09/02/2012  . Hypertriglyceridemia 07/28/2012  . Peripheral vascular disease ([place]) 07/28/2012  . Avitaminosis D 07/28/2012  . Essential (primary) hypertension 06/10/2012  . History of biliary T-tube placement 06/10/2012  . Leg pain 06/10/2012  . Current tobacco use 06/10/2012  . Atrophic vaginitis 06/07/2012  . Type 2 diabetes mellitus ([place]) 06/07/2012   Past  Medical History:  Diagnosis Date  . Acute congestive heart failure ([place])   . Acute diastolic CHF (congestive heart failure) ([place]) 09/14/2016  . Atrophic vaginitis 06/07/2012  . Avitaminosis D 07/28/2012  . CAD (coronary artery disease)    a. NSTEM 09/2016: cath showing severe diffuse disease of the RCA, ramus and Cx with mild-mod disease of LAD; PCI would require multiple stents and significant contrast usage thus medical therapy recommended.  . Cellulitis 07/12/2015  . Chest pain 01/01/2017  . Chronic diastolic CHF (congestive heart failure) ([place])   . Chronic kidney disease 09/23/2016  . CKD (chronic kidney disease), stage IV ([place])   . Constipation   . COPD GOLD  0 08/31/2017   Quit smoking 07/17/17 - Spirometry 09/01/2017  FEV1 1.23 (58%)  Ratio 57 with mild curvature p no rx - 09/01/2017  After extensive coaching inhaler device,  effectiveness =    75% with elipta with cough provoked  - PFT's  10/14/2017  FEV1 1.69 (68 % ) ratio 84  p no % improvement from saba p nothing prior to study with DLCO  50 % corrects to 76  % for alv volume   - 10/14/2017  Walked RA x 3 laps @ 1  . Current tobacco use 06/10/2012  . Diabetes mellitus with neuropathy ([place])   . Diabetic foot ulcer ([place]) 07/13/2015  . Diabetic polyneuropathy associated with type 2 diabetes mellitus ([place]) 08/19/2017  . DOE (dyspnea on exertion) 09/01/2017   09/01/2017  Walked RA x 3 laps @ 185 ft each stopped due to  End of study, nl to mod fast  pace, no  desat   But stopped to rest x 2  Spirometry 09/01/2017  FEV1 1.23 (58%)  Ratio 57  > trial of anoro    . Dyspnea    with exertion  . ESRD (end stage renal disease) ([place])    MWF - [facility]  . Essential (primary) hypertension 06/10/2012  . Foot amputation status    a. h/o left foot transmetatarsal amputation  . Headache    Migraines  . High cholesterol   . History of biliary T-tube placement 06/10/2012   Patient unaware   . History of cardiac arrest   . HLD (hyperlipidemia) 09/02/2012    Overview:  ICD-10 cut over    . Hypertension    a. patent renal arteries by PV angio 08/2015. b. heavy proteinuria 09/2016 ? nephrotic.  [name] Hypertensive heart disease with heart failure ([place])   . Hypertriglyceridemia 07/28/2012  . Leg pain 06/10/2012  . Normocytic anemia    pt denies this dx  . NSTEMI (non-ST elevated myocardial infarction) ([place]) 01/01/2017  . Obesity   . Onychomycosis 12/24/2015  . Peripheral nerve disease 09/02/2012   legs  . Peripheral vascular disease ([place]) 07/28/2012  . Pneumonia 2018  . Proteinuria   . PVD (peripheral vascular disease) ([place])    a. status post left SFA and popliteal stent and left SFA PTA with drug coated balloon in 08/2015.  . Status post transmetatarsal amputation of foot, left ([place]) 09/06/2015  .  Tobacco abuse    quit cigarettes 2019, smokes marijuana  . Type 2 diabetes mellitus ([place]) 06/07/2012  . Type 2 diabetes mellitus with peripheral angiopathy ([place]) 09/02/2012    Family History  Problem Relation Age of Onset  . Diabetes Mother   . Hypertension Father   . Colon cancer Neg Hx   . Stomach cancer Neg Hx   . Pancreatic cancer Neg Hx     Past Surgical History:  Procedure Laterality Date  . ABDOMINAL AORTOGRAM W/LOWER EXTREMITY Left 09/25/2019   Procedure: ABDOMINAL AORTOGRAM W/LOWER EXTREMITY;  Surgeon: Waynetta Sandy, MD;  Location: [facility];  Service: Cardiovascular;  Laterality: Left;  . ABDOMINAL HYSTERECTOMY    . AMPUTATION Left 09/06/2015   Procedure: LEFT TRANSMETATARSAL AMPUTATTION;  Surgeon: Newt Minion, MD;  Location: [facility];  Service: Orthopedics;  Laterality: Left;  . AMPUTATION Left 10/18/2019   Procedure: LEFT BELOW KNEE AMPUTATION;  Surgeon: Newt Minion, MD;  Location: [facility];  Service: Orthopedics;  Laterality: Left;  . AV FISTULA PLACEMENT Left 12/13/2018   Procedure: ARTERIOVENOUS (AV) FISTULA CREATION LEFT UPPER ARM;  Surgeon: Waynetta Sandy, MD;  Location: [facility];  Service: Vascular;  Laterality:  Left;  . BASCILIC VEIN TRANSPOSITION Left 03/30/2019   Procedure: LEFT ARM ARTERIOVENOUS VEIN TRANSPOSITION;  Surgeon: Serafina Mitchell, MD;  [facility];  Service: Vascular;  Laterality: Left;  . COLONOSCOPY     polyps x 1  . LEFT HEART CATH AND CORONARY ANGIOGRAPHY N/A 09/17/2016   Procedure: LEFT HEART CATH AND CORONARY ANGIOGRAPHY;  Surgeon: Jettie Booze, MD;  Location: [facility];  Service: Cardiovascular;  Laterality: N/A;  . PERIPHERAL VASCULAR ATHERECTOMY Left 09/25/2019   Procedure: PERIPHERAL VASCULAR ATHERECTOMY;  Surgeon: Waynetta Sandy, MD;  Location: [facility];  Service: Cardiovascular;  Laterality: Left;  SFA  . PERIPHERAL VASCULAR CATHETERIZATION N/A 08/16/2015   Procedure: Abdominal Aortogram;  Surgeon: Elam Dutch, MD;  Location: [facility];  Service: Cardiovascular;  Laterality: N/A;  . PERIPHERAL VASCULAR CATHETERIZATION Bilateral 08/16/2015   Procedure: Lower Extremity Angiography;  Surgeon: Elam Dutch, MD;  Location: [facility];  Service: Cardiovascular;  Laterality: Bilateral;  . PERIPHERAL VASCULAR CATHETERIZATION Left 08/16/2015   Procedure: Peripheral Vascular Intervention;  Surgeon: Elam Dutch, MD;  Location: [facility];  Service: Cardiovascular;  Laterality: Left;  SFA STENT X 2  . PERIPHERAL VASCULAR INTERVENTION Left 09/25/2019   Procedure: PERIPHERAL VASCULAR INTERVENTION;  Surgeon: Waynetta Sandy, MD;  Location: [facility];  Service: Cardiovascular;  Laterality: Left;  SFA   Social History   Occupational History  . Not on file  Tobacco Use  . Smoking status: Former Smoker    Packs/day: 1.00    Years: 42.00    Pack years: 42.00    Types: Cigarettes    Quit date: 07/17/2017    Years since quitting: 2.3  . Smokeless tobacco: Never Used  Vaping Use  . Vaping Use: Never used  Substance and Sexual Activity  . Alcohol use: Not Currently    Alcohol/week: 1.0 - 2.0  standard drink    Types: 1 - 2 Glasses of wine per week    Comment: on social occassions  . Drug use: Yes    Frequency: 7.0 times per week    Types: Marijuana    Comment: daily use - last use 03/28/19  . Sexual activity: Not on file    Comment:  Hysterectomy

## 2019-11-08 ENCOUNTER — Telehealth: Payer: Self-pay

## 2019-11-08 NOTE — Telephone Encounter (Signed)
Patient called to request excuse from court on 10/12/19. She was hospitalized from 10/11/19 to 10/19/19 for osteomyelitis of the foot under the care of Dr. Sharol Given and vascular services. Provided letter and informed patient it would be at the front desk for her. Patient verbalized understanding.

## 2019-11-21 ENCOUNTER — Encounter: Payer: Self-pay | Admitting: Family

## 2019-11-21 ENCOUNTER — Ambulatory Visit (INDEPENDENT_AMBULATORY_CARE_PROVIDER_SITE_OTHER): Payer: Medicare Other | Admitting: Family

## 2019-11-21 VITALS — Ht 64.0 in | Wt 146.0 lb

## 2019-11-21 DIAGNOSIS — Z89512 Acquired absence of left leg below knee: Secondary | ICD-10-CM

## 2019-11-21 NOTE — Progress Notes (Signed)
Post-Op Visit Note   Patient: Rebekah Johnson           Date of Birth: 05-14-1960           MRN: [id number] Visit Date: 11/21/2019 PCP: Patient, No Pcp Per  Chief Complaint:  Chief Complaint  Patient presents with  . Left Leg - Routine Post Op    10/18/19 left BKA     HPI:  HPI The patient is a 59 year old woman seen status post left below-knee amputation October 13.  Today she has no shrinker or dressing in place.  No swelling she feels well and has no concerns today Ortho Exam On examination of the left residual limb this is well-healed and is consolidating well staples are in place  Visit Diagnoses: No diagnosis found.  Plan: About half the staples harvested today patient tolerating removal quite poorly should like to return another day to have the remainder of her staples harvested.  Did provide an order to [facility] for her prosthesis set up  Follow-Up Instructions: No follow-ups on file.   Imaging: No results found.  Orders:  No orders of the defined types were placed in this encounter.  No orders of the defined types were placed in this encounter.    PMFS History: Patient Active Problem List   Diagnosis Date Noted  . Left below-knee amputee ([place]) 10/24/2019  . Pyogenic inflammation of bone ([place])   . Non-pressure chronic ulcer of other part of left foot limited to breakdown of skin ([place])   . Osteomyelitis of foot, acute ([place]) 10/11/2019  . Cutaneous abscess of left foot 10/04/2019  . Allergy, unspecified, initial encounter 08/07/2019  . Anaphylactic shock, unspecified, initial encounter 08/07/2019  . Constipation 08/04/2019  . Nausea without vomiting 08/04/2019  . Mild protein-calorie malnutrition ([place]) 11/29/2018  . Anemia in chronic kidney disease 10/04/2018  . Coagulation defect, unspecified ([place]) 10/04/2018  . Iron deficiency anemia, unspecified 10/04/2018  . Secondary hyperparathyroidism of renal origin ([place]) 10/04/2018  . Multinodular goiter  08/31/2018  . History of influenza 01/08/2018  . Cardiac arrest ([place]) 11/22/2017  . DOE (dyspnea on exertion) 09/01/2017  . COPD GOLD  0 08/31/2017  . Diabetic polyneuropathy associated with type 2 diabetes mellitus ([place]) 08/19/2017  . ESRD (end stage renal disease) on dialysis ([place]) 03/31/2017  . Neovascular glaucoma of right eye, moderate stage 03/31/2017  . NSTEMI (non-ST elevated myocardial infarction) ([place]) 01/01/2017  . Chest pain 01/01/2017  . Tobacco abuse   . PVD (peripheral vascular disease) ([place])   . Proteinuria   . Obesity   . Normocytic anemia   . Hypertension   . High cholesterol   . Foot amputation status   . Diabetes mellitus with neuropathy ([place])   . CKD (chronic kidney disease), stage IV ([place])   . Chronic diastolic CHF (congestive heart failure) ([place])   . CAD (coronary artery disease) 10/27/2016  . Chronic kidney disease 09/23/2016  . Acute congestive heart failure ([place])   . Hypertensive heart disease with heart failure ([place])   . Chronic combined systolic and diastolic heart failure ([place]) 09/14/2016  . Onychomycosis 12/24/2015  . Status post transmetatarsal amputation of foot, left ([place]) 09/06/2015  . Diabetic foot ulcer ([place]) 07/13/2015  . Cellulitis 07/12/2015  . HLD (hyperlipidemia) 09/02/2012  . Peripheral nerve disease 09/02/2012  . Type 2 diabetes mellitus with peripheral angiopathy ([place]) 09/02/2012  . Hypertriglyceridemia 07/28/2012  . Peripheral vascular disease ([place]) 07/28/2012  . Avitaminosis D 07/28/2012  . Essential (primary) hypertension 06/10/2012  .  History of biliary T-tube placement 06/10/2012  . Leg pain 06/10/2012  . Current tobacco use 06/10/2012  . Atrophic vaginitis 06/07/2012  . Type 2 diabetes mellitus ([place]) 06/07/2012   Past Medical History:  Diagnosis Date  . Acute congestive heart failure ([place])   . Acute diastolic CHF (congestive heart failure) ([place]) 09/14/2016  . Atrophic vaginitis 06/07/2012  . Avitaminosis D 07/28/2012  .  CAD (coronary artery disease)    a. NSTEM 09/2016: cath showing severe diffuse disease of the RCA, ramus and Cx with mild-mod disease of LAD; PCI would require multiple stents and significant contrast usage thus medical therapy recommended.  . Cellulitis 07/12/2015  . Chest pain 01/01/2017  . Chronic diastolic CHF (congestive heart failure) ([place])   . Chronic kidney disease 09/23/2016  . CKD (chronic kidney disease), stage IV ([place])   . Constipation   . COPD GOLD  0 08/31/2017   Quit smoking 07/17/17 - Spirometry 09/01/2017  FEV1 1.23 (58%)  Ratio 57 with mild curvature p no rx - 09/01/2017  After extensive coaching inhaler device,  effectiveness =    75% with elipta with cough provoked  - PFT's  10/14/2017  FEV1 1.69 (68 % ) ratio 84  p no % improvement from saba p nothing prior to study with DLCO  50 % corrects to 76  % for alv volume   - 10/14/2017  Walked RA x 3 laps @ 1  . Current tobacco use 06/10/2012  . Diabetes mellitus with neuropathy ([place])   . Diabetic foot ulcer ([place]) 07/13/2015  . Diabetic polyneuropathy associated with type 2 diabetes mellitus ([place] Al[place]) 08/19/2017  . DOE (dyspnea on exertion) 09/01/2017   09/01/2017  Walked RA x 3 laps @ 185 ft each stopped due to  End of study, nl to mod fast  pace, no  desat   But stopped to rest x 2  Spirometry 09/01/2017  FEV1 1.23 (58%)  Ratio 57  > trial of anoro    . Dyspnea    with exertion  . ESRD (end stage renal disease) ([place])    MWF - [facility]  . Essential (primary) hypertension 06/10/2012  . Foot amputation status    a. h/o left foot transmetatarsal amputation  . Headache    Migraines  . High cholesterol   . History of biliary T-tube placement 06/10/2012   Patient unaware   . History of cardiac arrest   . HLD (hyperlipidemia) 09/02/2012   Overview:  ICD-10 cut over    . Hypertension    a. patent renal arteries by PV angio 08/2015. b. heavy proteinuria 09/2016 ? nephrotic.  [name] Hypertensive heart disease with heart failure ([place])   .  Hypertriglyceridemia 07/28/2012  . Leg pain 06/10/2012  . Normocytic anemia    pt denies this dx  . NSTEMI (non-ST elevated myocardial infarction) ([place]) 01/01/2017  . Obesity   . Onychomycosis 12/24/2015  . Peripheral nerve disease 09/02/2012   legs  . Peripheral vascular disease ([place] Bay) 07/28/2012  . Pneumonia 2018  . Proteinuria   . PVD (peripheral vascular disease) ([place])    a. status post left SFA and popliteal stent and left SFA PTA with drug coated balloon in 08/2015.  . Status post transmetatarsal amputation of foot, left ([place] Hills) 09/06/2015  . Tobacco abuse    quit cigarettes 2019, smokes marijuana  . Type 2 diabetes mellitus ([place]) 06/07/2012  . Type 2 diabetes mellitus with peripheral angiopathy ([place]) 09/02/2012    Family History  Problem Relation Age of  Onset  . Diabetes Mother   . Hypertension Father   . Colon cancer Neg Hx   . Stomach cancer Neg Hx   . Pancreatic cancer Neg Hx     Past Surgical History:  Procedure Laterality Date  . ABDOMINAL AORTOGRAM W/LOWER EXTREMITY Left 09/25/2019   Procedure: ABDOMINAL AORTOGRAM W/LOWER EXTREMITY;  Surgeon: Waynetta Sandy, MD;  Location: [facility];  Service: Cardiovascular;  Laterality: Left;  . ABDOMINAL HYSTERECTOMY    . AMPUTATION Left 09/06/2015   Procedure: LEFT TRANSMETATARSAL AMPUTATTION;  Surgeon: Newt Minion, MD;  Location: [facility];  Service: Orthopedics;  Laterality: Left;  . AMPUTATION Left 10/18/2019   Procedure: LEFT BELOW KNEE AMPUTATION;  Surgeon: Newt Minion, MD;  Location: [facility];  Service: Orthopedics;  Laterality: Left;  . AV FISTULA PLACEMENT Left 12/13/2018   Procedure: ARTERIOVENOUS (AV) FISTULA CREATION LEFT UPPER ARM;  Surgeon: Waynetta Sandy, MD;  Location: [facility];  Service: Vascular;  Laterality: Left;  . BASCILIC VEIN TRANSPOSITION Left 03/30/2019   Procedure: LEFT ARM ARTERIOVENOUS VEIN TRANSPOSITION;  Surgeon: Serafina Mitchell, MD;  [facility];  Service: Vascular;  Laterality: Left;   . COLONOSCOPY     polyps x 1  . LEFT HEART CATH AND CORONARY ANGIOGRAPHY N/A 09/17/2016   Procedure: LEFT HEART CATH AND CORONARY ANGIOGRAPHY;  Surgeon: Jettie Booze, MD;  Location: [facility];  Service: Cardiovascular;  Laterality: N/A;  . PERIPHERAL VASCULAR ATHERECTOMY Left 09/25/2019   Procedure: PERIPHERAL VASCULAR ATHERECTOMY;  Surgeon: Waynetta Sandy, MD;  Location: [facility];  Service: Cardiovascular;  Laterality: Left;  SFA  . PERIPHERAL VASCULAR CATHETERIZATION N/A 08/16/2015   Procedure: Abdominal Aortogram;  Surgeon: Elam Dutch, MD;  Location: [facility];  Service: Cardiovascular;  Laterality: N/A;  . PERIPHERAL VASCULAR CATHETERIZATION Bilateral 08/16/2015   Procedure: Lower Extremity Angiography;  Surgeon: Elam Dutch, MD;  Location: [facility];  Service: Cardiovascular;  Laterality: Bilateral;  . PERIPHERAL VASCULAR CATHETERIZATION Left 08/16/2015   Procedure: Peripheral Vascular Intervention;  Surgeon: Elam Dutch, MD;  Location: [facility];  Service: Cardiovascular;  Laterality: Left;  SFA STENT X 2  . PERIPHERAL VASCULAR INTERVENTION Left 09/25/2019   Procedure: PERIPHERAL VASCULAR INTERVENTION;  Surgeon: Waynetta Sandy, MD;  Location: [facility];  Service: Cardiovascular;  Laterality: Left;  SFA   Social History   Occupational History  . Not on file  Tobacco Use  . Smoking status: Former Smoker    Packs/day: 1.00    Years: 42.00    Pack years: 42.00    Types: Cigarettes    Quit date: 07/17/2017    Years since quitting: 2.3  . Smokeless tobacco: Never Used  Vaping Use  . Vaping Use: Never used  Substance and Sexual Activity  . Alcohol use: Not Currently    Alcohol/week: 1.0 - 2.0 standard drink    Types: 1 - 2 Glasses of wine per week    Comment: on social occassions  . Drug use: Yes    Frequency: 7.0 times per week    Types: Marijuana    Comment: daily use - last use  03/28/19  . Sexual activity: Not on file    Comment: Hysterectomy

## 2019-11-23 ENCOUNTER — Ambulatory Visit
Admission: RE | Admit: 2019-11-23 | Discharge: 2019-11-23 | Disposition: A | Discharge status: Home / Self Care | Payer: Medicare Other | Source: Ambulatory Visit | Attending: Endocrinology | Admitting: Endocrinology

## 2019-11-23 DIAGNOSIS — E042 Nontoxic multinodular goiter: Secondary | ICD-10-CM

## 2019-11-28 ENCOUNTER — Ambulatory Visit: Payer: Medicare Other | Admitting: Family

## 2019-12-07 ENCOUNTER — Ambulatory Visit: Payer: Medicare Other | Admitting: Physician Assistant

## 2019-12-11 ENCOUNTER — Ambulatory Visit (INDEPENDENT_AMBULATORY_CARE_PROVIDER_SITE_OTHER): Payer: Medicare Other | Admitting: Physician Assistant

## 2019-12-11 ENCOUNTER — Encounter: Payer: Self-pay | Admitting: Physician Assistant

## 2019-12-11 ENCOUNTER — Other Ambulatory Visit: Payer: Self-pay

## 2019-12-11 VITALS — Ht 64.0 in | Wt 146.0 lb

## 2019-12-11 DIAGNOSIS — Z89512 Acquired absence of left leg below knee: Secondary | ICD-10-CM

## 2019-12-11 NOTE — Progress Notes (Signed)
Office Visit Note   Patient: Rebekah Johnson           Date of Birth: Feb 28, 1960           MRN: [id number] Visit Date: 12/11/2019              Requested by: No referring provider defined for this encounter. PCP: Patient, No Pcp Per  Chief Complaint  Patient presents with  . Left Leg - Follow-up    Left below knee amputation 10/18/2019      HPI: Patient presents today she is almost 8 weeks status post left below-knee amputation.  She has struggled with pain but this is improving.  She is only taking pain medication when she needs that she is putting lidocaine cream on her stump.  She did lose her shrinker  Assessment & Plan: Visit Diagnoses: No diagnosis found.  Plan: Patient was given a prescription for a large shrinker.  Will be following up with [facility] for fabrication of her prosthetic.  The rest of her staples were removed today.  Follow-Up Instructions: No follow-ups on file.   Ortho Exam  Patient is alert, oriented, no adenopathy, well-dressed, normal affect, normal respiratory effort. Swelling has decreased.  Overall well apposed wound edges.  Staples still in place that have not yet been harvested.  She does have some eschar.  No cellulitis or signs of infection no drainage  Imaging: No results found. No images are attached to the encounter.  Labs: Lab Results  Component Value Date   HGBA1C 6.8 (H) 10/11/2019   HGBA1C 6.9 (A) 02/02/2019   HGBA1C 5.6 11/01/2018   ESRSEDRATE 57 (H) 07/13/2015   CRP 0.7 07/13/2015   LABURIC 5.7 07/13/2015   REPTSTATUS 10/16/2019 FINAL 10/11/2019   CULT  10/11/2019    NO GROWTH 5 DAYS Performed at [facility], [place] [address], [place], [place] [address]      Lab Results  Component Value Date   ALBUMIN 2.2 (L) 10/16/2019   ALBUMIN 2.3 (L) 10/14/2019   ALBUMIN 2.4 (L) 10/12/2019   LABURIC 5.7 07/13/2015    Lab Results  Component Value Date   MG 1.7 01/01/2017   MG 1.7 09/16/2016   MG 1.7 09/14/2016   No  results found for: VD25OH  No results found for: PREALBUMIN CBC EXTENDED Latest Ref Rng & Units 10/18/2019 10/16/2019 10/15/2019  WBC 4.0 - 10.5 K/uL - 9.4 9.7  RBC 3.87 - 5.11 MIL/uL - 3.48(L) 3.77(L)  HGB 12.0 - 15.0 g/dL 12.2 10.0(L) 10.4(L)  HCT 36 - 46 % 36.0 32.0(L) 34.1(L)  PLT 150 - 400 K/uL - 278 300  NEUTROABS 1.7 - 7.7 K/uL - 6.0 6.3  LYMPHSABS 0.7 - 4.0 K/uL - 2.0 2.1     Body mass index is 25.06 kg/m.  Orders:  No orders of the defined types were placed in this encounter.  No orders of the defined types were placed in this encounter.    Procedures: No procedures performed  Clinical Data: No additional findings.  ROS:  All other systems negative, except as noted in the HPI. Review of Systems  Objective: Vital Signs: Ht 5\' 4"  (1.626 m)   Wt 146 lb (66.2 kg)   LMP  (LMP Unknown)   BMI 25.06 kg/m   Specialty Comments:  No specialty comments available.  PMFS History: Patient Active Problem List   Diagnosis Date Noted  . Left below-knee amputee ([place]) 10/24/2019  . Pyogenic inflammation of bone (Grayson)   .  Non-pressure chronic ulcer of other part of left foot limited to breakdown of skin (Houghton)   . Osteomyelitis of foot, acute (Nulato) 10/11/2019  . Cutaneous abscess of left foot 10/04/2019  . Allergy, unspecified, initial encounter 08/07/2019  . Anaphylactic shock, unspecified, initial encounter 08/07/2019  . Constipation 08/04/2019  . Nausea without vomiting 08/04/2019  . Mild protein-calorie malnutrition ([place]) 11/29/2018  . Anemia in chronic kidney disease 10/04/2018  . Coagulation defect, unspecified (Barstow) 10/04/2018  . Iron deficiency anemia, unspecified 10/04/2018  . Secondary hyperparathyroidism of renal origin (Darden) 10/04/2018  . Multinodular goiter 08/31/2018  . History of influenza 01/08/2018  . Cardiac arrest (Dora) 11/22/2017  . DOE (dyspnea on exertion) 09/01/2017  . COPD GOLD  0 08/31/2017  . Diabetic polyneuropathy associated with type 2  diabetes mellitus (Sarg[place]) 08/19/2017  . ESRD (end stage renal disease) on dialysis (Acush[place]) 03/31/2017  . Neovascular glaucoma of right eye, moderate stage 03/31/2017  . NSTEMI (non-ST elevated myocardial infarction) (Vincent) 01/01/2017  . Chest pain 01/01/2017  . Tobacco abuse   . PVD (peripheral vascular disease) ([place])   . Proteinuria   . Obesity   . Normocytic anemia   . Hypertension   . High cholesterol   . Foot amputation status   . Diabetes mellitus with neuropathy (Gilliam)   . CKD (chronic kidney disease), stage IV (Frank[place] Park)   . Chronic diastolic CHF (congestive heart failure) (Niantic)   . CAD (coronary artery disease) 10/27/2016  . Chronic kidney disease 09/23/2016  . Acute congestive heart failure (Sea Cliff)   . Hypertensive heart disease with heart failure (Roanoke Rapids)   . Chronic combined systolic and diastolic heart failure (Cruzville) 09/14/2016  . Onychomycosis 12/24/2015  . Status post transmetatarsal amputation of foot, left (Fre[place]) 09/06/2015  . Diabetic foot ulcer (Waldo) 07/13/2015  . Cellulitis 07/12/2015  . HLD (hyperlipidemia) 09/02/2012  . Peripheral nerve disease 09/02/2012  . Type 2 diabetes mellitus with peripheral angiopathy (Ellsinore) 09/02/2012  . Hypertriglyceridemia 07/28/2012  . Peripheral vascular disease ([place]) 07/28/2012  . Avitaminosis D 07/28/2012  . Essential (primary) hypertension 06/10/2012  . History of biliary T-tube placement 06/10/2012  . Leg pain 06/10/2012  . Current tobacco use 06/10/2012  . Atrophic vaginitis 06/07/2012  . Type 2 diabetes mellitus (Hickman) 06/07/2012   Past Medical History:  Diagnosis Date  . Acute congestive heart failure ([place])   . Acute diastolic CHF (congestive heart failure) (Byrnedale) 09/14/2016  . Atrophic vaginitis 06/07/2012  . Avitaminosis D 07/28/2012  . CAD (coronary artery disease)    a. NSTEM 09/2016: cath showing severe diffuse disease of the RCA, ramus and Cx with mild-mod disease of LAD; PCI would require multiple stents and significant contrast  usage thus medical therapy recommended.  . Cellulitis 07/12/2015  . Chest pain 01/01/2017  . Chronic diastolic CHF (congestive heart failure) (El Dorado)   . Chronic kidney disease 09/23/2016  . CKD (chronic kidney disease), stage IV (Shell Rock)   . Constipation   . COPD GOLD  0 08/31/2017   Quit smoking 07/17/17 - Spirometry 09/01/2017  FEV1 1.23 (58%)  Ratio 57 with mild curvature p no rx - 09/01/2017  After extensive coaching inhaler device,  effectiveness =    75% with elipta with cough provoked  - PFT's  10/14/2017  FEV1 1.69 (68 % ) ratio 84  p no % improvement from saba p nothing prior to study with DLCO  50 % corrects to 76  % for alv volume   - 10/14/2017  Walked RA x 3 laps @  1  . Current tobacco use 06/10/2012  . Diabetes mellitus with neuropathy (Turnerville)   . Diabetic foot ulcer ([place]) 07/13/2015  . Diabetic polyneuropathy associated with type 2 diabetes mellitus (Crookston) 08/19/2017  . DOE (dyspnea on exertion) 09/01/2017   09/01/2017  Walked RA x 3 laps @ 185 ft each stopped due to  End of study, nl to mod fast  pace, no  desat   But stopped to rest x 2  Spirometry 09/01/2017  FEV1 1.23 (58%)  Ratio 57  > trial of anoro    . Dyspnea    with exertion  . ESRD (end stage renal disease) ([place]ville)    MWF - [facility]  . Essential (primary) hypertension 06/10/2012  . Foot amputation status    a. h/o left foot transmetatarsal amputation  . Headache    Migraines  . High cholesterol   . History of biliary T-tube placement 06/10/2012   Patient unaware   . History of cardiac arrest   . HLD (hyperlipidemia) 09/02/2012   Overview:  ICD-10 cut over    . Hypertension    a. patent renal arteries by PV angio 08/2015. b. heavy proteinuria 09/2016 ? nephrotic.  [name] Hypertensive heart disease with heart failure ([place])   . Hypertriglyceridemia 07/28/2012  . Leg pain 06/10/2012  . Normocytic anemia    pt denies this dx  . NSTEMI (non-ST elevated myocardial infarction) ([place]) 01/01/2017  . Obesity   . Onychomycosis 12/24/2015  .  Peripheral nerve disease 09/02/2012   legs  . Peripheral vascular disease (Clarked[place]) 07/28/2012  . Pneumonia 2018  . Proteinuria   . PVD (peripheral vascular disease) ([place])    a. status post left SFA and popliteal stent and left SFA PTA with drug coated balloon in 08/2015.  . Status post transmetatarsal amputation of foot, left (Chest[place]) 09/06/2015  . Tobacco abuse    quit cigarettes 2019, smokes marijuana  . Type 2 diabetes mellitus (W[place]all) 06/07/2012  . Type 2 diabetes mellitus with peripheral angiopathy (Rayville) 09/02/2012    Family History  Problem Relation Age of Onset  . Diabetes Mother   . Hypertension Father   . Colon cancer Neg Hx   . Stomach cancer Neg Hx   . Pancreatic cancer Neg Hx     Past Surgical History:  Procedure Laterality Date  . ABDOMINAL AORTOGRAM W/LOWER EXTREMITY Left 09/25/2019   Procedure: ABDOMINAL AORTOGRAM W/LOWER EXTREMITY;  Surgeon: Waynetta Sandy, MD;  Location: [facility];  Service: Cardiovascular;  Laterality: Left;  . ABDOMINAL HYSTERECTOMY    . AMPUTATION Left 09/06/2015   Procedure: LEFT TRANSMETATARSAL AMPUTATTION;  Surgeon: Newt Minion, MD;  Location: [facility];  Service: Orthopedics;  Laterality: Left;  . AMPUTATION Left 10/18/2019   Procedure: LEFT BELOW KNEE AMPUTATION;  Surgeon: Newt Minion, MD;  Location: [facility];  Service: Orthopedics;  Laterality: Left;  . AV FISTULA PLACEMENT Left 12/13/2018   Procedure: ARTERIOVENOUS (AV) FISTULA CREATION LEFT UPPER ARM;  Surgeon: Waynetta Sandy, MD;  Location: [facility];  Service: Vascular;  Laterality: Left;  . BASCILIC VEIN TRANSPOSITION Left 03/30/2019   Procedure: LEFT ARM ARTERIOVENOUS VEIN TRANSPOSITION;  Surgeon: Serafina Mitchell, MD;  [facility];  Service: Vascular;  Laterality: Left;  . COLONOSCOPY     polyps x 1  . LEFT HEART CATH AND CORONARY ANGIOGRAPHY N/A 09/17/2016   Procedure: LEFT HEART CATH AND CORONARY ANGIOGRAPHY;  Surgeon: Jettie Booze, MD;  Location: [facility];  Service:  Cardiovascular;  Laterality: N/A;  . PERIPHERAL VASCULAR ATHERECTOMY Left 09/25/2019   Procedure: PERIPHERAL VASCULAR ATHERECTOMY;  Surgeon: Waynetta Sandy, MD;  Location: [facility];  Service: Cardiovascular;  Laterality: Left;  SFA  . PERIPHERAL VASCULAR CATHETERIZATION N/A 08/16/2015   Procedure: Abdominal Aortogram;  Surgeon: Elam Dutch, MD;  Location: [facility];  Service: Cardiovascular;  Laterality: N/A;  . PERIPHERAL VASCULAR CATHETERIZATION Bilateral 08/16/2015   Procedure: Lower Extremity Angiography;  Surgeon: Elam Dutch, MD;  Location: [facility];  Service: Cardiovascular;  Laterality: Bilateral;  . PERIPHERAL VASCULAR CATHETERIZATION Left 08/16/2015   Procedure: Peripheral Vascular Intervention;  Surgeon: Elam Dutch, MD;  Location: [facility];  Service: Cardiovascular;  Laterality: Left;  SFA STENT X 2  . PERIPHERAL VASCULAR INTERVENTION Left 09/25/2019   Procedure: PERIPHERAL VASCULAR INTERVENTION;  Surgeon: Waynetta Sandy, MD;  Location: [facility];  Service: Cardiovascular;  Laterality: Left;  SFA   Social History   Occupational History  . Not on file  Tobacco Use  . Smoking status: Former Smoker    Packs/day: 1.00    Years: 42.00    Pack years: 42.00    Types: Cigarettes    Quit date: 07/17/2017    Years since quitting: 2.4  . Smokeless tobacco: Never Used  Vaping Use  . Vaping Use: Never used  Substance and Sexual Activity  . Alcohol use: Not Currently    Alcohol/week: 1.0 - 2.0 standard drink    Types: 1 - 2 Glasses of wine per week    Comment: on social occassions  . Drug use: Yes    Frequency: 7.0 times per week    Types: Marijuana    Comment: daily use - last use 03/28/19  . Sexual activity: Not on file    Comment: Hysterectomy

## 2019-12-12 ENCOUNTER — Telehealth: Payer: Self-pay | Admitting: Orthopedic Surgery

## 2019-12-12 NOTE — Telephone Encounter (Signed)
Received call from [place] with [facility] advised patient is non compliant and will be discharged. [name] said he can not locate the patient and patient will not return phone calls. Patient canceled the last visit as well. The number to contact [name] is [phone]

## 2019-12-13 NOTE — Telephone Encounter (Signed)
Noted  

## 2019-12-25 ENCOUNTER — Encounter: Payer: Self-pay | Admitting: Physician Assistant

## 2019-12-25 ENCOUNTER — Ambulatory Visit (INDEPENDENT_AMBULATORY_CARE_PROVIDER_SITE_OTHER): Payer: Medicare Other | Admitting: Physician Assistant

## 2019-12-25 VITALS — Ht 64.0 in | Wt 146.0 lb

## 2019-12-25 DIAGNOSIS — Z89512 Acquired absence of left leg below knee: Secondary | ICD-10-CM

## 2019-12-25 NOTE — Progress Notes (Signed)
Office Visit Note   Patient: Rebekah Johnson           Date of Birth: July 11, 1960           MRN: [id number] Visit Date: 12/25/2019              Requested by: No referring provider defined for this encounter. PCP: Patient, No Pcp Per  Chief Complaint  Patient presents with  . Left Leg - Follow-up    10/18/2019 Left BKA      HPI: Patient is 2-1/2 months status post left below-knee amputation she is doing very well.  She is going to make an appointment to have the prosthetic made and has a prescription  Assessment & Plan: Visit Diagnoses: No diagnosis found.  Plan: I will place the order for physical therapy follow-up in 1 month  Follow-Up Instructions: No follow-ups on file.   Ortho Exam  Patient is alert, oriented, no adenopathy, well-dressed, normal affect, normal respiratory effort. Below-knee amputation stump overall is well-healed she does have some resolving eschar.  She does not want me to debride this today.  There is no drainage no cellulitis no erythema  Imaging: No results found. No images are attached to the encounter.  Labs: Lab Results  Component Value Date   HGBA1C 6.8 (H) 10/11/2019   HGBA1C 6.9 (A) 02/02/2019   HGBA1C 5.6 11/01/2018   ESRSEDRATE 57 (H) 07/13/2015   CRP 0.7 07/13/2015   LABURIC 5.7 07/13/2015   REPTSTATUS 10/16/2019 FINAL 10/11/2019   CULT  10/11/2019    NO GROWTH 5 DAYS Performed at [facility], [place] [address], [place], [place] [address]      Lab Results  Component Value Date   ALBUMIN 2.2 (L) 10/16/2019   ALBUMIN 2.3 (L) 10/14/2019   ALBUMIN 2.4 (L) 10/12/2019   LABURIC 5.7 07/13/2015    Lab Results  Component Value Date   MG 1.7 01/01/2017   MG 1.7 09/16/2016   MG 1.7 09/14/2016   No results found for: VD25OH  No results found for: PREALBUMIN CBC EXTENDED Latest Ref Rng & Units 10/18/2019 10/16/2019 10/15/2019  WBC 4.0 - 10.5 K/uL - 9.4 9.7  RBC 3.87 - 5.11 MIL/uL - 3.48(L) 3.77(L)  HGB 12.0 - 15.0  g/dL 12.2 10.0(L) 10.4(L)  HCT 36.0 - 46.0 % 36.0 32.0(L) 34.1(L)  PLT 150 - 400 K/uL - 278 300  NEUTROABS 1.7 - 7.7 K/uL - 6.0 6.3  LYMPHSABS 0.7 - 4.0 K/uL - 2.0 2.1     Body mass index is 25.06 kg/m.  Orders:  No orders of the defined types were placed in this encounter.  No orders of the defined types were placed in this encounter.    Procedures: No procedures performed  Clinical Data: No additional findings.  ROS:  All other systems negative, except as noted in the HPI. Review of Systems  Objective: Vital Signs: Ht 5\' 4"  (1.626 m)   Wt 146 lb (66.2 kg)   LMP  (LMP Unknown)   BMI 25.06 kg/m   Specialty Comments:  No specialty comments available.  PMFS History: Patient Active Problem List   Diagnosis Date Noted  . Left below-knee amputee ([place]) 10/24/2019  . Pyogenic inflammation of bone ([place])   . Non-pressure chronic ulcer of other part of left foot limited to breakdown of skin ([place])   . Osteomyelitis of foot, acute ([place]) 10/11/2019  . Cutaneous abscess of left foot 10/04/2019  . Allergy, unspecified, initial encounter 08/07/2019  .  Anaphylactic shock, unspecified, initial encounter 08/07/2019  . Constipation 08/04/2019  . Nausea without vomiting 08/04/2019  . Mild protein-calorie malnutrition ([place]) 11/29/2018  . Anemia in chronic kidney disease 10/04/2018  . Coagulation defect, unspecified ([place]) 10/04/2018  . Iron deficiency anemia, unspecified 10/04/2018  . Secondary hyperparathyroidism of renal origin ([place]) 10/04/2018  . Multinodular goiter 08/31/2018  . History of influenza 01/08/2018  . Cardiac arrest ([place]) 11/22/2017  . DOE (dyspnea on exertion) 09/01/2017  . COPD GOLD  0 08/31/2017  . Diabetic polyneuropathy associated with type 2 diabetes mellitus ([place]) 08/19/2017  . ESRD (end stage renal disease) on dialysis ([place]) 03/31/2017  . Neovascular glaucoma of right eye, moderate stage 03/31/2017  . NSTEMI (non-ST elevated myocardial infarction) ([place])  01/01/2017  . Chest pain 01/01/2017  . Tobacco abuse   . PVD (peripheral vascular disease) ([place])   . Proteinuria   . Obesity   . Normocytic anemia   . Hypertension   . High cholesterol   . Foot amputation status   . Diabetes mellitus with neuropathy ([place])   . CKD (chronic kidney disease), stage IV ([place])   . Chronic diastolic CHF (congestive heart failure) ([place])   . CAD (coronary artery disease) 10/27/2016  . Chronic kidney disease 09/23/2016  . Acute congestive heart failure ([place])   . Hypertensive heart disease with heart failure ([place])   . Chronic combined systolic and diastolic heart failure ([place]) 09/14/2016  . Onychomycosis 12/24/2015  . Status post transmetatarsal amputation of foot, left ([place]) 09/06/2015  . Diabetic foot ulcer ([place]) 07/13/2015  . Cellulitis 07/12/2015  . HLD (hyperlipidemia) 09/02/2012  . Peripheral nerve disease 09/02/2012  . Type 2 diabetes mellitus with peripheral angiopathy ([place]) 09/02/2012  . Hypertriglyceridemia 07/28/2012  . Peripheral vascular disease ([place]) 07/28/2012  . Avitaminosis D 07/28/2012  . Essential (primary) hypertension 06/10/2012  . History of biliary T-tube placement 06/10/2012  . Leg pain 06/10/2012  . Current tobacco use 06/10/2012  . Atrophic vaginitis 06/07/2012  . Type 2 diabetes mellitus ([place]) 06/07/2012   Past Medical History:  Diagnosis Date  . Acute congestive heart failure ([place])   . Acute diastolic CHF (congestive heart failure) ([place]) 09/14/2016  . Atrophic vaginitis 06/07/2012  . Avitaminosis D 07/28/2012  . CAD (coronary artery disease)    a. NSTEM 09/2016: cath showing severe diffuse disease of the RCA, ramus and Cx with mild-mod disease of LAD; PCI would require multiple stents and significant contrast usage thus medical therapy recommended.  . Cellulitis 07/12/2015  . Chest pain 01/01/2017  . Chronic diastolic CHF (congestive heart failure) ([place])   . Chronic kidney disease 09/23/2016  . CKD (chronic kidney disease),  stage IV ([place])   . Constipation   . COPD GOLD  0 08/31/2017   Quit smoking 07/17/17 - Spirometry 09/01/2017  FEV1 1.23 (58%)  Ratio 57 with mild curvature p no rx - 09/01/2017  After extensive coaching inhaler device,  effectiveness =    75% with elipta with cough provoked  - PFT's  10/14/2017  FEV1 1.69 (68 % ) ratio 84  p no % improvement from saba p nothing prior to study with DLCO  50 % corrects to 76  % for alv volume   - 10/14/2017  Walked RA x 3 laps @ 1  . Current tobacco use 06/10/2012  . Diabetes mellitus with neuropathy ([place])   . Diabetic foot ulcer ([place]) 07/13/2015  . Diabetic polyneuropathy associated with type 2 diabetes mellitus ([place]) 08/19/2017  . DOE (dyspnea on exertion)  09/01/2017   09/01/2017  Walked RA x 3 laps @ 185 ft each stopped due to  End of study, nl to mod fast  pace, no  desat   But stopped to rest x 2  Spirometry 09/01/2017  FEV1 1.23 (58%)  Ratio 57  > trial of anoro    . Dyspnea    with exertion  . ESRD (end stage renal disease) ([place])    MWF - [facility]  . Essential (primary) hypertension 06/10/2012  . Foot amputation status    a. h/o left foot transmetatarsal amputation  . Headache    Migraines  . High cholesterol   . History of biliary T-tube placement 06/10/2012   Patient unaware   . History of cardiac arrest   . HLD (hyperlipidemia) 09/02/2012   Overview:  ICD-10 cut over    . Hypertension    a. patent renal arteries by PV angio 08/2015. b. heavy proteinuria 09/2016 ? nephrotic.  [name] Hypertensive heart disease with heart failure ([place])   . Hypertriglyceridemia 07/28/2012  . Leg pain 06/10/2012  . Normocytic anemia    pt denies this dx  . NSTEMI (non-ST elevated myocardial infarction) ([place]) 01/01/2017  . Obesity   . Onychomycosis 12/24/2015  . Peripheral nerve disease 09/02/2012   legs  . Peripheral vascular disease ([place]) 07/28/2012  . Pneumonia 2018  . Proteinuria   . PVD (peripheral vascular disease) ([place])    a. status post left SFA and popliteal stent and  left SFA PTA with drug coated balloon in 08/2015.  . Status post transmetatarsal amputation of foot, left ([place]) 09/06/2015  . Tobacco abuse    quit cigarettes 2019, smokes marijuana  . Type 2 diabetes mellitus ([place]) 06/07/2012  . Type 2 diabetes mellitus with peripheral angiopathy ([place]) 09/02/2012    Family History  Problem Relation Age of Onset  . Diabetes Mother   . Hypertension Father   . Colon cancer Neg Hx   . Stomach cancer Neg Hx   . Pancreatic cancer Neg Hx     Past Surgical History:  Procedure Laterality Date  . ABDOMINAL AORTOGRAM W/LOWER EXTREMITY Left 09/25/2019   Procedure: ABDOMINAL AORTOGRAM W/LOWER EXTREMITY;  Surgeon: Waynetta Sandy, MD;  Location: [facility];  Service: Cardiovascular;  Laterality: Left;  . ABDOMINAL HYSTERECTOMY    . AMPUTATION Left 09/06/2015   Procedure: LEFT TRANSMETATARSAL AMPUTATTION;  Surgeon: Newt Minion, MD;  Location: [facility];  Service: Orthopedics;  Laterality: Left;  . AMPUTATION Left 10/18/2019   Procedure: LEFT BELOW KNEE AMPUTATION;  Surgeon: Newt Minion, MD;  Location: [facility];  Service: Orthopedics;  Laterality: Left;  . AV FISTULA PLACEMENT Left 12/13/2018   Procedure: ARTERIOVENOUS (AV) FISTULA CREATION LEFT UPPER ARM;  Surgeon: Waynetta Sandy, MD;  Location: [facility];  Service: Vascular;  Laterality: Left;  . BASCILIC VEIN TRANSPOSITION Left 03/30/2019   Procedure: LEFT ARM ARTERIOVENOUS VEIN TRANSPOSITION;  Surgeon: Serafina Mitchell, MD;  [facility];  Service: Vascular;  Laterality: Left;  . COLONOSCOPY     polyps x 1  . LEFT HEART CATH AND CORONARY ANGIOGRAPHY N/A 09/17/2016   Procedure: LEFT HEART CATH AND CORONARY ANGIOGRAPHY;  Surgeon: Jettie Booze, MD;  Location: [facility];  Service: Cardiovascular;  Laterality: N/A;  . PERIPHERAL VASCULAR ATHERECTOMY Left 09/25/2019   Procedure: PERIPHERAL VASCULAR ATHERECTOMY;  Surgeon: Waynetta Sandy, MD;  Location: [facility];   Service: Cardiovascular;  Laterality: Left;  SFA  . PERIPHERAL  VASCULAR CATHETERIZATION N/A 08/16/2015   Procedure: Abdominal Aortogram;  Surgeon: Elam Dutch, MD;  Location: [facility];  Service: Cardiovascular;  Laterality: N/A;  . PERIPHERAL VASCULAR CATHETERIZATION Bilateral 08/16/2015   Procedure: Lower Extremity Angiography;  Surgeon: Elam Dutch, MD;  Location: [facility];  Service: Cardiovascular;  Laterality: Bilateral;  . PERIPHERAL VASCULAR CATHETERIZATION Left 08/16/2015   Procedure: Peripheral Vascular Intervention;  Surgeon: Elam Dutch, MD;  Location: [facility];  Service: Cardiovascular;  Laterality: Left;  SFA STENT X 2  . PERIPHERAL VASCULAR INTERVENTION Left 09/25/2019   Procedure: PERIPHERAL VASCULAR INTERVENTION;  Surgeon: Waynetta Sandy, MD;  Location: [facility];  Service: Cardiovascular;  Laterality: Left;  SFA   Social History   Occupational History  . Not on file  Tobacco Use  . Smoking status: Former Smoker    Packs/day: 1.00    Years: 42.00    Pack years: 42.00    Types: Cigarettes    Quit date: 07/17/2017    Years since quitting: 2.4  . Smokeless tobacco: Never Used  Vaping Use  . Vaping Use: Never used  Substance and Sexual Activity  . Alcohol use: Not Currently    Alcohol/week: 1.0 - 2.0 standard drink    Types: 1 - 2 Glasses of wine per week    Comment: on social occassions  . Drug use: Yes    Frequency: 7.0 times per week    Types: Marijuana    Comment: daily use - last use 03/28/19  . Sexual activity: Not on file    Comment: Hysterectomy

## 2020-01-03 ENCOUNTER — Encounter: Payer: Self-pay | Admitting: Orthopedic Surgery

## 2020-01-03 ENCOUNTER — Ambulatory Visit (INDEPENDENT_AMBULATORY_CARE_PROVIDER_SITE_OTHER): Payer: Medicare Other | Admitting: Physician Assistant

## 2020-01-03 VITALS — Ht 64.0 in | Wt 146.0 lb

## 2020-01-03 DIAGNOSIS — Z89512 Acquired absence of left leg below knee: Secondary | ICD-10-CM

## 2020-01-03 NOTE — Progress Notes (Signed)
Office Visit Note   Patient: Rebekah Johnson           Date of Birth: Nov 11, 1960           MRN: [id number] Visit Date: 01/03/2020              Requested by: No referring provider defined for this encounter. PCP: Patient, No Pcp Per  Chief Complaint  Patient presents with  . Left Leg - Follow-up    10/18/2019 Left BKA      HPI: This is a pleasant 59 year old woman who is status post left below-knee amputation.  2-1/2 months ago.  She has been seen by [facility] but they want her to check with [place] to see if they should continue making the prosthetic or wait until her remaining eschars have resolved.  Assessment & Plan: Visit Diagnoses: No diagnosis found.  Plan: I told the patient that I am concerned there may be more staples under the thickened eschars.  This may be causing her just a little bit more pain.  She insists that she does not want me to remove any more of the eschars today.  She will follow-up in 2 weeks.  Follow-Up Instructions: No follow-ups on file.   Ortho Exam  Patient is alert, oriented, no adenopathy, well-dressed, normal affect, normal respiratory effort. Focused examination demonstrates well-healed surgical incision without any dehiscence.  She does have areas of thickened eschar.  There is no surrounding cellulitis or evidence of infection.  I did remove 1 of these to find a staple beneath it which was removed without difficulty.  I asked her if she would allow me to look under some more of them and she declined at this time  Imaging: No results found. No images are attached to the encounter.  Labs: Lab Results  Component Value Date   HGBA1C 6.8 (H) 10/11/2019   HGBA1C 6.9 (A) 02/02/2019   HGBA1C 5.6 11/01/2018   ESRSEDRATE 57 (H) 07/13/2015   CRP 0.7 07/13/2015   LABURIC 5.7 07/13/2015   REPTSTATUS 10/16/2019 FINAL 10/11/2019   CULT  10/11/2019    NO GROWTH 5 DAYS Performed at [facility], [place] [address], [place],  [address]       Lab Results  Component Value Date   ALBUMIN 2.2 (L) 10/16/2019   ALBUMIN 2.3 (L) 10/14/2019   ALBUMIN 2.4 (L) 10/12/2019   LABURIC 5.7 07/13/2015    Lab Results  Component Value Date   MG 1.7 01/01/2017   MG 1.7 09/16/2016   MG 1.7 09/14/2016   No results found for: VD25OH  No results found for: PREALBUMIN CBC EXTENDED Latest Ref Rng & Units 10/18/2019 10/16/2019 10/15/2019  WBC 4.0 - 10.5 K/uL - 9.4 9.7  RBC 3.87 - 5.11 MIL/uL - 3.48(L) 3.77(L)  HGB 12.0 - 15.0 g/dL 12.2 10.0(L) 10.4(L)  HCT 36.0 - 46.0 % 36.0 32.0(L) 34.1(L)  PLT 150 - 400 K/uL - 278 300  NEUTROABS 1.7 - 7.7 K/uL - 6.0 6.3  LYMPHSABS 0.7 - 4.0 K/uL - 2.0 2.1     Body mass index is 25.06 kg/m.  Orders:  No orders of the defined types were placed in this encounter.  No orders of the defined types were placed in this encounter.    Procedures: No procedures performed  Clinical Data: No additional findings.  ROS:  All other systems negative, except as noted in the HPI. Review of Systems  Objective: Vital Signs: Ht 5\' 4"  (1.626 m)  Wt 146 lb (66.2 kg)   LMP  (LMP Unknown)   BMI 25.06 kg/m   Specialty Comments:  No specialty comments available.  PMFS History: Patient Active Problem List   Diagnosis Date Noted  . Left below-knee amputee ([place]) 10/24/2019  . Pyogenic inflammation of bone ([place])   . Non-pressure chronic ulcer of other part of left foot limited to breakdown of skin ([place])   . Osteomyelitis of foot, acute ([place]) 10/11/2019  . Cutaneous abscess of left foot 10/04/2019  . Allergy, unspecified, initial encounter 08/07/2019  . Anaphylactic shock, unspecified, initial encounter 08/07/2019  . Constipation 08/04/2019  . Nausea without vomiting 08/04/2019  . Mild protein-calorie malnutrition ([place]) 11/29/2018  . Anemia in chronic kidney disease 10/04/2018  . Coagulation defect, unspecified ([place]) 10/04/2018  . Iron deficiency anemia, unspecified 10/04/2018  . Secondary  hyperparathyroidism of renal origin ([place]) 10/04/2018  . Multinodular goiter 08/31/2018  . History of influenza 01/08/2018  . Cardiac arrest ([place]) 11/22/2017  . DOE (dyspnea on exertion) 09/01/2017  . COPD GOLD  0 08/31/2017  . Diabetic polyneuropathy associated with type 2 diabetes mellitus ([place]) 08/19/2017  . ESRD (end stage renal disease) on dialysis ([place]) 03/31/2017  . Neovascular glaucoma of right eye, moderate stage 03/31/2017  . NSTEMI (non-ST elevated myocardial infarction) ([place]) 01/01/2017  . Chest pain 01/01/2017  . Tobacco abuse   . PVD (peripheral vascular disease) ([place])   . Proteinuria   . Obesity   . Normocytic anemia   . Hypertension   . High cholesterol   . Foot amputation status   . Diabetes mellitus with neuropathy ([place])   . CKD (chronic kidney disease), stage IV ([place])   . Chronic diastolic CHF (congestive heart failure) ([place])   . CAD (coronary artery disease) 10/27/2016  . Chronic kidney disease 09/23/2016  . Acute congestive heart failure ([place])   . Hypertensive heart disease with heart failure ([place])   . Chronic combined systolic and diastolic heart failure (Vill[place]) 09/14/2016  . Onychomycosis 12/24/2015  . Status post transmetatarsal amputation of foot, left ([place]) 09/06/2015  . Diabetic foot ulcer ([place]) 07/13/2015  . Cellulitis 07/12/2015  . HLD (hyperlipidemia) 09/02/2012  . Peripheral nerve disease 09/02/2012  . Type 2 diabetes mellitus with peripheral angiopathy ([place]) 09/02/2012  . Hypertriglyceridemia 07/28/2012  . Peripheral vascular disease (Ch[place]) 07/28/2012  . Avitaminosis D 07/28/2012  . Essential (primary) hypertension 06/10/2012  . History of biliary T-tube placement 06/10/2012  . Leg pain 06/10/2012  . Current tobacco use 06/10/2012  . Atrophic vaginitis 06/07/2012  . Type 2 diabetes mellitus ([place]) 06/07/2012   Past Medical History:  Diagnosis Date  . Acute congestive heart failure (Wallace)   . Acute diastolic CHF (congestive heart failure) ([place])  09/14/2016  . Atrophic vaginitis 06/07/2012  . Avitaminosis D 07/28/2012  . CAD (coronary artery disease)    a. NSTEM 09/2016: cath showing severe diffuse disease of the RCA, ramus and Cx with mild-mod disease of LAD; PCI would require multiple stents and significant contrast usage thus medical therapy recommended.  . Cellulitis 07/12/2015  . Chest pain 01/01/2017  . Chronic diastolic CHF (congestive heart failure) (Rushville)   . Chronic kidney disease 09/23/2016  . CKD (chronic kidney disease), stage IV (H[place])   . Constipation   . COPD GOLD  0 08/31/2017   Quit smoking 07/17/17 - Spirometry 09/01/2017  FEV1 1.23 (58%)  Ratio 57 with mild curvature p no rx - 09/01/2017  After extensive coaching inhaler device,  effectiveness =  75% with elipta with cough provoked  - PFT's  10/14/2017  FEV1 1.69 (68 % ) ratio 84  p no % improvement from saba p nothing prior to study with DLCO  50 % corrects to 76  % for alv volume   - 10/14/2017  Walked RA x 3 laps @ 1  . Current tobacco use 06/10/2012  . Diabetes mellitus with neuropathy (Dunle[place])   . Diabetic foot ulcer ([place]) 07/13/2015  . Diabetic polyneuropathy associated with type 2 diabetes mellitus (P[place]burg) 08/19/2017  . DOE (dyspnea on exertion) 09/01/2017   09/01/2017  Walked RA x 3 laps @ 185 ft each stopped due to  End of study, nl to mod fast  pace, no  desat   But stopped to rest x 2  Spirometry 09/01/2017  FEV1 1.23 (58%)  Ratio 57  > trial of anoro    . Dyspnea    with exertion  . ESRD (end stage renal disease) ([place])    MWF - [facility]  . Essential (primary) hypertension 06/10/2012  . Foot amputation status    a. h/o left foot transmetatarsal amputation  . Headache    Migraines  . High cholesterol   . History of biliary T-tube placement 06/10/2012   Patient unaware   . History of cardiac arrest   . HLD (hyperlipidemia) 09/02/2012   Overview:  ICD-10 cut over    . Hypertension    a. patent renal arteries by PV angio 08/2015. b. heavy proteinuria 09/2016 ? nephrotic.   [name] Hypertensive heart disease with heart failure ([place])   . Hypertriglyceridemia 07/28/2012  . Leg pain 06/10/2012  . Normocytic anemia    pt denies this dx  . NSTEMI (non-ST elevated myocardial infarction) ([place]) 01/01/2017  . Obesity   . Onychomycosis 12/24/2015  . Peripheral nerve disease 09/02/2012   legs  . Peripheral vascular disease ([place]) 07/28/2012  . Pneumonia 2018  . Proteinuria   . PVD (peripheral vascular disease) ([place])    a. status post left SFA and popliteal stent and left SFA PTA with drug coated balloon in 08/2015.  . Status post transmetatarsal amputation of foot, left ([place]) 09/06/2015  . Tobacco abuse    quit cigarettes 2019, smokes marijuana  . Type 2 diabetes mellitus ([place]) 06/07/2012  . Type 2 diabetes mellitus with peripheral angiopathy ([place]) 09/02/2012    Family History  Problem Relation Age of Onset  . Diabetes Mother   . Hypertension Father   . Colon cancer Neg Hx   . Stomach cancer Neg Hx   . Pancreatic cancer Neg Hx     Past Surgical History:  Procedure Laterality Date  . ABDOMINAL AORTOGRAM W/LOWER EXTREMITY Left 09/25/2019   Procedure: ABDOMINAL AORTOGRAM W/LOWER EXTREMITY;  Surgeon: Waynetta Sandy, MD;  Location: [facility];  Service: Cardiovascular;  Laterality: Left;  . ABDOMINAL HYSTERECTOMY    . AMPUTATION Left 09/06/2015   Procedure: LEFT TRANSMETATARSAL AMPUTATTION;  Surgeon: Newt Minion, MD;  Location: [facility];  Service: Orthopedics;  Laterality: Left;  . AMPUTATION Left 10/18/2019   Procedure: LEFT BELOW KNEE AMPUTATION;  Surgeon: Newt Minion, MD;  Location: [facility];  Service: Orthopedics;  Laterality: Left;  . AV FISTULA PLACEMENT Left 12/13/2018   Procedure: ARTERIOVENOUS (AV) FISTULA CREATION LEFT UPPER ARM;  Surgeon: Waynetta Sandy, MD;  Location: [facility];  Service: Vascular;  Laterality: Left;  . BASCILIC VEIN TRANSPOSITION Left 03/30/2019   Procedure: LEFT ARM ARTERIOVENOUS VEIN TRANSPOSITION;  Surgeon: Serafina Mitchell,  MD;  [facility];  Service: Vascular;  Laterality: Left;  . COLONOSCOPY     polyps x 1  . LEFT HEART CATH AND CORONARY ANGIOGRAPHY N/A 09/17/2016   Procedure: LEFT HEART CATH AND CORONARY ANGIOGRAPHY;  Surgeon: Jettie Booze, MD;  Location: [facility];  Service: Cardiovascular;  Laterality: N/A;  . PERIPHERAL VASCULAR ATHERECTOMY Left 09/25/2019   Procedure: PERIPHERAL VASCULAR ATHERECTOMY;  Surgeon: Waynetta Sandy, MD;  Location: [facility];  Service: Cardiovascular;  Laterality: Left;  SFA  . PERIPHERAL VASCULAR CATHETERIZATION N/A 08/16/2015   Procedure: Abdominal Aortogram;  Surgeon: Elam Dutch, MD;  Location: [facility];  Service: Cardiovascular;  Laterality: N/A;  . PERIPHERAL VASCULAR CATHETERIZATION Bilateral 08/16/2015   Procedure: Lower Extremity Angiography;  Surgeon: Elam Dutch, MD;  Location: [facility];  Service: Cardiovascular;  Laterality: Bilateral;  . PERIPHERAL VASCULAR CATHETERIZATION Left 08/16/2015   Procedure: Peripheral Vascular Intervention;  Surgeon: Elam Dutch, MD;  Location: [facility];  Service: Cardiovascular;  Laterality: Left;  SFA STENT X 2  . PERIPHERAL VASCULAR INTERVENTION Left 09/25/2019   Procedure: PERIPHERAL VASCULAR INTERVENTION;  Surgeon: Waynetta Sandy, MD;  Location: [facility];  Service: Cardiovascular;  Laterality: Left;  SFA   Social History   Occupational History  . Not on file  Tobacco Use  . Smoking status: Former Smoker    Packs/day: 1.00    Years: 42.00    Pack years: 42.00    Types: Cigarettes    Quit date: 07/17/2017    Years since quitting: 2.4  . Smokeless tobacco: Never Used  Vaping Use  . Vaping Use: Never used  Substance and Sexual Activity  . Alcohol use: Not Currently    Alcohol/week: 1.0 - 2.0 standard drink    Types: 1 - 2 Glasses of wine per week    Comment: on social occassions  . Drug use: Yes    Frequency: 7.0 times per  week    Types: Marijuana    Comment: daily use - last use 03/28/19  . Sexual activity: Not on file    Comment: Hysterectomy

## 2020-01-15 ENCOUNTER — Other Ambulatory Visit: Payer: Self-pay | Admitting: Physician Assistant

## 2020-01-15 ENCOUNTER — Telehealth: Payer: Self-pay | Admitting: Orthopedic Surgery

## 2020-01-15 MED ORDER — OXYCODONE-ACETAMINOPHEN 5-325 MG PO TABS
1.0000 | ORAL_TABLET | Freq: Three times a day (TID) | ORAL | 0 refills | Status: DC | PRN
Start: 2020-01-15 — End: 2020-01-15

## 2020-01-15 MED ORDER — OXYCODONE-ACETAMINOPHEN 5-325 MG PO TABS
1.0000 | ORAL_TABLET | Freq: Three times a day (TID) | ORAL | 0 refills | Status: DC | PRN
Start: 2020-01-15 — End: 2020-03-26

## 2020-01-15 NOTE — Telephone Encounter (Signed)
Called and lm on vm to advise pt of message below. To call with any questions.

## 2020-01-15 NOTE — Telephone Encounter (Signed)
Patient called needing Rx refilled (Oxycodone) Rebekah Johnson in [place]   The number to contact patient is [phone]

## 2020-01-15 NOTE — Telephone Encounter (Signed)
Refilled but every 8 hours as needed

## 2020-01-15 NOTE — Telephone Encounter (Signed)
Pt is s/p a left BKA 10/18/19 please see below and advise.

## 2020-01-16 ENCOUNTER — Telehealth: Payer: Self-pay | Admitting: Physical Therapy

## 2020-01-16 ENCOUNTER — Encounter: Payer: Medicare Other | Admitting: Physical Therapy

## 2020-01-16 ENCOUNTER — Ambulatory Visit: Payer: Medicare Other | Admitting: Orthopedic Surgery

## 2020-01-16 NOTE — Telephone Encounter (Signed)
PT called patient due to no-show evaluation appointment.  She is not getting her prosthesis until 1/14 so PT rescheduled evaluation to next week.

## 2020-01-22 ENCOUNTER — Ambulatory Visit: Payer: Medicare Other | Admitting: Physician Assistant

## 2020-01-23 ENCOUNTER — Encounter: Payer: Medicare Other | Admitting: Physical Therapy

## 2020-01-30 ENCOUNTER — Encounter: Payer: Medicare Other | Admitting: Physical Therapy

## 2020-01-30 ENCOUNTER — Telehealth: Payer: Self-pay | Admitting: Physical Therapy

## 2020-01-30 NOTE — Telephone Encounter (Signed)
PT spoke with [facility] and she has not received her prosthesis yet due to weather issues preventing patient from getting to their office.  PT left message for patient to call prosthetist's office to set up appointment for delivery. Then call PT clinic at [facility] to set up PT evaluation.

## 2020-02-19 ENCOUNTER — Encounter: Payer: Medicare Other | Admitting: Physical Therapy

## 2020-02-19 ENCOUNTER — Telehealth: Payer: Self-pay | Admitting: Orthopedic Surgery

## 2020-02-19 NOTE — Telephone Encounter (Signed)
Pt called and said that scab came off and theres a staple still in there. She will be seen by robin at 8:00 tomorrow morning. Is there anyway you guys can just see her to get it out.

## 2020-02-20 ENCOUNTER — Ambulatory Visit (INDEPENDENT_AMBULATORY_CARE_PROVIDER_SITE_OTHER): Payer: Medicare Other | Admitting: Physical Therapy

## 2020-02-20 ENCOUNTER — Encounter: Payer: Self-pay | Admitting: Physical Therapy

## 2020-02-20 ENCOUNTER — Other Ambulatory Visit: Payer: Self-pay

## 2020-02-20 ENCOUNTER — Telehealth: Payer: Self-pay | Admitting: Physical Therapy

## 2020-02-20 DIAGNOSIS — M6281 Muscle weakness (generalized): Secondary | ICD-10-CM

## 2020-02-20 DIAGNOSIS — R2689 Other abnormalities of gait and mobility: Secondary | ICD-10-CM

## 2020-02-20 DIAGNOSIS — R293 Abnormal posture: Secondary | ICD-10-CM | POA: Diagnosis not present

## 2020-02-20 DIAGNOSIS — R2681 Unsteadiness on feet: Secondary | ICD-10-CM

## 2020-02-20 NOTE — Telephone Encounter (Signed)
Pt was in office today and staple removed.

## 2020-02-20 NOTE — Telephone Encounter (Signed)
Ms. Caison has a staple in her limb still. I don't have any staple removal kits up here. She is here until 8:45 for her evaluation. Can you possibly come upstairs to remove the staple? Thanks Shirlean Mylar

## 2020-02-20 NOTE — Therapy (Signed)
[facility] [address] [place], [place], [address] Phone: [phone]   Fax:  [phone]  Physical Therapy Evaluation  Patient Details  Name: Rebekah Johnson MRN: [id number] Date of Birth: [birth year] Referring Provider (PT): Bevely Palmer Persons, [place]   Encounter Date: 02/20/2020   PT End of Session - 02/20/20 0919    Visit Number 1    Number of Visits 26    Date for PT Re-Evaluation 05/20/20    Authorization Type UHC Medicare    Authorization Time Period $0 copay    PT Start Time 0758    PT Stop Time 0846    PT Time Calculation (min) 48 min    Equipment Utilized During Treatment Gait belt    Activity Tolerance Patient tolerated treatment well;Patient limited by fatigue    Behavior During Therapy [facility] for tasks assessed/performed           Past Medical History:  Diagnosis Date  . Acute congestive heart failure ([place])   . Acute diastolic CHF (congestive heart failure) ([place]) 09/14/2016  . Atrophic vaginitis 06/07/2012  . Avitaminosis D 07/28/2012  . CAD (coronary artery disease)    a. NSTEM 09/2016: cath showing severe diffuse disease of the RCA, ramus and Cx with mild-mod disease of LAD; PCI would require multiple stents and significant contrast usage thus medical therapy recommended.  . Cellulitis 07/12/2015  . Chest pain 01/01/2017  . Chronic diastolic CHF (congestive heart failure) ([place])   . Chronic kidney disease 09/23/2016  . CKD (chronic kidney disease), stage IV ([place])   . Constipation   . COPD GOLD  0 08/31/2017   Quit smoking 07/17/17 - Spirometry 09/01/2017  FEV1 1.23 (58%)  Ratio 57 with mild curvature p no rx - 09/01/2017  After extensive coaching inhaler device,  effectiveness =    75% with elipta with cough provoked  - PFT's  10/14/2017  FEV1 1.69 (68 % ) ratio 84  p no % improvement from saba p nothing prior to study with DLCO  50 % corrects to 76  % for alv volume   - 10/14/2017  Walked RA x 3 laps @ 1  . Current tobacco use 06/10/2012   . Diabetes mellitus with neuropathy ([place])   . Diabetic foot ulcer ([place]) 07/13/2015  . Diabetic polyneuropathy associated with type 2 diabetes mellitus ([place]) 08/19/2017  . DOE (dyspnea on exertion) 09/01/2017   09/01/2017  Walked RA x 3 laps @ 185 ft each stopped due to  End of study, nl to mod fast  pace, no  desat   But stopped to rest x 2  Spirometry 09/01/2017  FEV1 1.23 (58%)  Ratio 57  > trial of anoro    . Dyspnea    with exertion  . ESRD (end stage renal disease) ([place])    MWF - [facility]  . Essential (primary) hypertension 06/10/2012  . Foot amputation status    a. h/o left foot transmetatarsal amputation  . Headache    Migraines  . High cholesterol   . History of biliary T-tube placement 06/10/2012   Patient unaware   . History of cardiac arrest   . HLD (hyperlipidemia) 09/02/2012   Overview:  ICD-10 cut over    . Hypertension    a. patent renal arteries by PV angio 08/2015. b. heavy proteinuria 09/2016 ? nephrotic.  [name] Hypertensive heart disease with heart failure ([place])   . Hypertriglyceridemia 07/28/2012  . Leg pain 06/10/2012  . Normocytic anemia    pt  denies this dx  . NSTEMI (non-ST elevated myocardial infarction) ([place]) 01/01/2017  . Obesity   . Onychomycosis 12/24/2015  . Peripheral nerve disease 09/02/2012   legs  . Peripheral vascular disease ([place]) 07/28/2012  . Pneumonia 2018  . Proteinuria   . PVD (peripheral vascular disease) ([place])    a. status post left SFA and popliteal stent and left SFA PTA with drug coated balloon in 08/2015.  . Status post transmetatarsal amputation of foot, left ([place]) 09/06/2015  . Tobacco abuse    quit cigarettes 2019, smokes marijuana  . Type 2 diabetes mellitus ([place]) 06/07/2012  . Type 2 diabetes mellitus with peripheral angiopathy ([place]) 09/02/2012    Past Surgical History:  Procedure Laterality Date  . ABDOMINAL AORTOGRAM W/LOWER EXTREMITY Left 09/25/2019   Procedure: ABDOMINAL AORTOGRAM W/LOWER EXTREMITY;  Surgeon: Waynetta Sandy,  MD;  Location: [place];  Service: Cardiovascular;  Laterality: Left;  . ABDOMINAL HYSTERECTOMY    . AMPUTATION Left 09/06/2015   Procedure: LEFT TRANSMETATARSAL AMPUTATTION;  Surgeon: Newt Minion, MD;  Location: [facility];  Service: Orthopedics;  Laterality: Left;  . AMPUTATION Left 10/18/2019   Procedure: LEFT BELOW KNEE AMPUTATION;  Surgeon: Newt Minion, MD;  Location: [facility] [facility];  Service: Orthopedics;  Laterality: Left;  . AV FISTULA PLACEMENT Left 12/13/2018   Procedure: ARTERIOVENOUS (AV) FISTULA CREATION LEFT UPPER ARM;  Surgeon: Waynetta Sandy, MD;  Location: [facility];  Service: Vascular;  Laterality: Left;  . BASCILIC VEIN TRANSPOSITION Left 03/30/2019   Procedure: LEFT ARM ARTERIOVENOUS VEIN TRANSPOSITION;  Surgeon: Serafina Mitchell, MD;  Location: [facility];  Service: Vascular;  Laterality: Left;  . COLONOSCOPY     polyps x 1  . LEFT HEART CATH AND CORONARY ANGIOGRAPHY N/A 09/17/2016   Procedure: LEFT HEART CATH AND CORONARY ANGIOGRAPHY;  Surgeon: Jettie Booze, MD;  Location: [facility];  Service: Cardiovascular;  Laterality: N/A;  . PERIPHERAL VASCULAR ATHERECTOMY Left 09/25/2019   Procedure: PERIPHERAL VASCULAR ATHERECTOMY;  Surgeon: Waynetta Sandy, MD;  Location: [facility];  Service: Cardiovascular;  Laterality: Left;  SFA  . PERIPHERAL VASCULAR CATHETERIZATION N/A 08/16/2015   Procedure: Abdominal Aortogram;  Surgeon: Elam Dutch, MD;  Location: [place];  Service: Cardiovascular;  Laterality: N/A;  . PERIPHERAL VASCULAR CATHETERIZATION Bilateral 08/16/2015   Procedure: Lower Extremity Angiography;  Surgeon: Elam Dutch, MD;  Location: [facility];  Service: Cardiovascular;  Laterality: Bilateral;  . PERIPHERAL VASCULAR CATHETERIZATION Left 08/16/2015   Procedure: Peripheral Vascular Intervention;  Surgeon: Elam Dutch, MD;  Location: [facility];  Service: Cardiovascular;  Laterality: Left;  SFA STENT X  2  . PERIPHERAL VASCULAR INTERVENTION Left 09/25/2019   Procedure: PERIPHERAL VASCULAR INTERVENTION;  Surgeon: Waynetta Sandy, MD;  Location: [facility];  Service: Cardiovascular;  Laterality: Left;  SFA    There were no vitals filed for this visit.    Subjective Assessment - 02/20/20 0806    Subjective This 60yo female was referred to PT by Bevely Palmer Persons, PA with [phone] (ICD-10-CM) - Left below-knee amputee. She underwent BKA on 10/18/2019 due to osteomyelitis. He received prosthesis 02/15/2020.    Pertinent History left TTA, ESRD on HD, HTN, DM2, CHF, CAD, NSTEM 09/2016, COPD, PVD,    Patient Stated Goals to use prosthesis to get community, return to fishing    Currently in Pain? No/denies  [facility] PT Assessment - 02/20/20 0800      Assessment   Medical Diagnosis Z89.512 (ICD-10-CM) - Left below-knee amputee    Referring Provider (PT) Bevely Palmer Persons, PA    Onset Date/Surgical Date 02/15/20   prosthesis delivery   Hand Dominance Right    Prior Therapy HHPT stopped by end of Nov 2021      Precautions   Precautions Fall    Precaution Comments No BP LUE      Balance Screen   Has the patient fallen in the past 6 months No    Has the patient had a decrease in activity level because of a fear of falling?  Yes    Is the patient reluctant to leave their home because of a fear of falling?  No      Home Environment   Living Environment Private residence    Living Arrangements Spouse/significant other    Type of [place] entrance   back entrance 2 steps no rails   Home Layout One level    Wilson - 4 wheels;Walker - 2 wheels;Cane - single point;Wheelchair - manual;Tub bench      Prior Function   Level of Independence Independent;Independent with household mobility without device;Independent with community mobility without device    Vocation On disability    Leisure fishing, playing cards       Posture/Postural Control   Posture/Postural Control Postural limitations    Postural Limitations Rounded Shoulders;Forward head;Flexed trunk;Weight shift right      ROM / Strength   AROM / PROM / Strength PROM;Strength      PROM   Overall PROM  Within functional limits for tasks performed      Strength   Overall Strength Deficits    Overall Strength Comments gross functional Bil. hips 4/5, knees 4+/5, right ankle DF 3/5      Transfers   Transfers Sit to Stand;Stand to Sit    Sit to Stand 5: Supervision;With upper extremity assist;With armrests;From chair/3-in-1;Other (comment)   to locked Rollator walker or uses back of legs against chair to stabilize   Stand to Sit 5: Supervision;With upper extremity assist;With armrests;To chair/3-in-1;Other (comment)   from locked Rollator walker or uses back of legs against chair to stabilize     Ambulation/Gait   Ambulation/Gait Yes    Ambulation/Gait Assistance 5: Supervision    Ambulation/Gait Assistance Details excessive UE weight bearing on walker    Ambulation Distance (Feet) 100 Feet    Assistive device Rollator;Prosthesis    Gait Pattern Step-through pattern;Decreased step length - left;Decreased stance time - left;Decreased hip/knee flexion - left;Decreased weight shift to left;Left hip hike;Antalgic;Lateral hip instability;Trunk flexed    Ambulation Surface Level;Indoor    Gait velocity 0.72 ft/sec      Standardized Balance Assessment   Standardized Balance Assessment Berg Balance Test      Berg Balance Test   Sit to Stand Needs minimal aid to stand or to stabilize    Standing Unsupported Able to stand 2 minutes with supervision    Sitting with Back Unsupported but Feet Supported on Floor or Stool Able to sit safely and securely 2 minutes    Stand to Sit Uses backs of legs against chair to control descent    Transfers Able to transfer safely, definite need of hands    Standing Unsupported with Eyes Closed Able to stand 10 seconds  with supervision    Standing Unsupported with Feet Together  Needs help to attain position but able to stand for 30 seconds with feet together    From Standing, Reach Forward with Outstretched Arm Reaches forward but needs supervision    From Standing Position, Pick up Object from Floor Unable to pick up and needs supervision    From Standing Position, Turn to Look Behind Over each Shoulder Needs supervision when turning    Turn 360 Degrees Needs assistance while turning    Standing Unsupported, Alternately Place Feet on Step/Stool Needs assistance to keep from falling or unable to try    Standing Unsupported, One Foot in [place] balance while stepping or standing    Standing on One Leg Unable to try or needs assist to prevent fall    Total Score 20           Prosthetics Assessment - 02/20/20 0800      Prosthetics   Prosthetic Care Dependent with Skin check;Residual limb care;Prosthetic cleaning;Care of non-amputated limb;Ply sock cleaning;Correct ply sock adjustment;Proper wear schedule/adjustment;Proper weight-bearing schedule/adjustment    Donning prosthesis  Supervision    Doffing prosthesis  Modified independent (Device/Increase time)    Current prosthetic wear tolerance (days/week)  daily since delivery 5 days prior to PT evaluation    Current prosthetic wear tolerance (#hours/day)  ~3 hrs 2x/day    Current prosthetic weight-bearing tolerance (hours/day)  patient reports no discomfort with standing & gait but took an oxycontin prior to PT    Edema non-pitting edema    Residual limb condition  staple & superficial suture present on incision Bevely Palmer Persons, PA removed during PT evaluation with small amount of bleeding from suture removal) PT covered with Tegaderm until it heals.  Dry ashy skin, normal temperature. Cylinderical shape.    Prosthesis Description silicon liner with shuttle pin lock suspension, total contact socket design, dynamic reponse foot    K code/activity level  with prosthetic use  K3 full community with variable cadence                     Objective measurements completed on examination: See above findings.       [place] Adult PT Treatment/Exercise - 02/20/20 0800      Prosthetics   Prosthetic Care Comments  Wear prosthesis 3hrs on 2-3 x/day with off 2 hrs between wears.    Education Provided Skin check;Residual limb care;Prosthetic cleaning;Correct ply sock adjustment;Proper Donning;Proper wear schedule/adjustment;Other (comment)   see prosthetic care comments   Person(s) Educated Patient    Education Method Explanation;Demonstration;Tactile cues;Verbal cues    Education Method Verbalized understanding;Tactile cues required;Verbal cues required;Needs further instruction                    PT Short Term Goals - 02/20/20 0927      PT SHORT TERM GOAL #1   Title Patient demonstrates proper donning & verbalizes proper cleaning of prosthesis.    Time 1    Period Months    Status New    Target Date 03/21/20      PT SHORT TERM GOAL #2   Title Patient tolerates prosthesis wear >10hrs total without skin issues.    Time 1    Period Months    Status New    Target Date 03/21/20      PT SHORT TERM GOAL #3   Title Patient able to reach 7" without UE support with supervision.    Time 1    Period Months    Status New  Target Date 03/21/20      PT SHORT TERM GOAL #4   Title Patient ambulates with cane & prosthesis 200' with minA    Time 1    Period Months    Status New    Target Date 03/21/20      PT SHORT TERM GOAL #5   Title Patient negotiates ramps & curbs with rollator walker & prosthesis safely modified independent.    Time 1    Period Months    Status New    Target Date 03/21/20             PT Long Term Goals - 02/20/20 0923      PT LONG TERM GOAL #1   Title Patient demonstrates & verbalizes proper prosthetic care to enable safe utilization of prosthesis.    Time 3    Period Months    Status New     Target Date 05/16/20      PT LONG TERM GOAL #2   Title Patient tolerates wear of prosthesis >90% of awake hours without skin or limb pain issues for function throughout her day.    Time 3    Period Months    Status New    Target Date 05/16/20      PT LONG TERM GOAL #3   Title Berg Balance >/= 45/56 to indicate lower fall risk.    Time 3    Period Months    Status New    Target Date 05/16/20      PT LONG TERM GOAL #4   Title Patient ambulates >500' with LRAD & prosthesis modified independent for community mobility.    Time 3    Period Months    Status New    Target Date 05/16/20      PT LONG TERM GOAL #5   Title Patient negotiates ramps, curbs & stairs single rail with LRAD & prosthesis modified independent for community access.    Time 3    Period Months    Status New    Target Date 05/16/20                  Plan - 02/20/20 1007    Clinical Impression Statement This 60yo female underwent a left Transtibial Amputation on 10/18/2019 and received her first prosthesis on 02/15/2020. She has 2 superficial wounds from staple/suture removal & 1 dry scab on incision. She is dependent in proper prosthetic care & use with high risk of skin issues and has history of DM.  She has limited wear which impairs function during her day.  Berg Balance 20/56 indicates high fall risk & dependency in standing ADLs.  Her prosthetic gait is heavily dependent on rollator walker, gait velocity of 0.72 ft/sec and deviations indicate high fall risk.  Patient would benefit from skilled PT to improve safe use of prosthesis and improve safe functional mobility.    Personal Factors and Comorbidities Comorbidity 3+;Fitness;Time since onset of injury/illness/exacerbation    Comorbidities left TTA, ESRD on HD, HTN, DM2, CHF, CAD, NSTEM 09/2016, COPD, PVD, blind left eye    Examination-Activity Limitations Lift;Locomotion Level;Stairs;Stand;Transfers    Examination-Participation Restrictions Community  Activity;Other   recreation of fishing   Stability/Clinical Decision Making Evolving/Moderate complexity    Clinical Decision Making Moderate    Rehab Potential Good    PT Frequency 2x / week    PT Duration Other (comment)   13 weeks (90 days)   PT Treatment/Interventions ADLs/Self Care Home Management;DME Instruction;Gait training;Stair training;Functional  mobility training;Therapeutic activities;Therapeutic exercise;Balance training;Neuromuscular re-education;Patient/family education;Prosthetic Training    PT Next Visit Plan review prosthetic care, instruct in HEP at sink, instruct in negotiating ramps & curbs with rollator walker & prosthesis    Consulted and Agree with Plan of Care Patient           Patient will benefit from skilled therapeutic intervention in order to improve the following deficits and impairments:  Abnormal gait,Decreased activity tolerance,Decreased balance,Decreased endurance,Decreased knowledge of use of DME,Decreased mobility,Decreased skin integrity,Decreased strength,Postural dysfunction,Prosthetic Dependency  Visit Diagnosis: Unsteadiness on feet  Other abnormalities of gait and mobility  Muscle weakness (generalized)  Abnormal posture     Problem List Patient Active Problem List   Diagnosis Date Noted  . Left below-knee amputee ([place]) 10/24/2019  . Pyogenic inflammation of bone ([place])   . Non-pressure chronic ulcer of other part of left foot limited to breakdown of skin ([place])   . Osteomyelitis of foot, acute ([place]) 10/11/2019  . Cutaneous abscess of left foot 10/04/2019  . Allergy, unspecified, initial encounter 08/07/2019  . Anaphylactic shock, unspecified, initial encounter 08/07/2019  . Constipation 08/04/2019  . Nausea without vomiting 08/04/2019  . Mild protein-calorie malnutrition ([place]) 11/29/2018  . Anemia in chronic kidney disease 10/04/2018  . Coagulation defect, unspecified ([place]) 10/04/2018  . Iron deficiency anemia, unspecified  10/04/2018  . Secondary hyperparathyroidism of renal origin ([place]) 10/04/2018  . Multinodular goiter 08/31/2018  . History of influenza 01/08/2018  . Cardiac arrest ([place]) 11/22/2017  . DOE (dyspnea on exertion) 09/01/2017  . COPD GOLD  0 08/31/2017  . Diabetic polyneuropathy associated with type 2 diabetes mellitus ([place]) 08/19/2017  . ESRD (end stage renal disease) on dialysis ([place]) 03/31/2017  . Neovascular glaucoma of right eye, moderate stage 03/31/2017  . NSTEMI (non-ST elevated myocardial infarction) ([place]) 01/01/2017  . Chest pain 01/01/2017  . Tobacco abuse   . PVD (peripheral vascular disease) ([place])   . Proteinuria   . Obesity   . Normocytic anemia   . Hypertension   . High cholesterol   . Foot amputation status   . Diabetes mellitus with neuropathy ([place])   . CKD (chronic kidney disease), stage IV ([place])   . Chronic diastolic CHF (congestive heart failure) ([place])   . CAD (coronary artery disease) 10/27/2016  . Chronic kidney disease 09/23/2016  . Acute congestive heart failure ([place])   . Hypertensive heart disease with heart failure ([place])   . Chronic combined systolic and diastolic heart failure ([place]) 09/14/2016  . Onychomycosis 12/24/2015  . Status post transmetatarsal amputation of foot, left ([place]) 09/06/2015  . Diabetic foot ulcer ([place]) 07/13/2015  . Cellulitis 07/12/2015  . HLD (hyperlipidemia) 09/02/2012  . Peripheral nerve disease 09/02/2012  . Type 2 diabetes mellitus with peripheral angiopathy ([place]) 09/02/2012  . Hypertriglyceridemia 07/28/2012  . Peripheral vascular disease ([place]) 07/28/2012  . Avitaminosis D 07/28/2012  . Essential (primary) hypertension 06/10/2012  . History of biliary T-tube placement 06/10/2012  . Leg pain 06/10/2012  . Current tobacco use 06/10/2012  . Atrophic vaginitis 06/07/2012  . Type 2 diabetes mellitus ([place]) 06/07/2012    Jamey Reas, PT, DPT 02/20/2020, 11:16 AM  [facility] [address] [place], [place], [address] Phone: [phone]   Fax:  [phone]  Name: Rebekah Johnson MRN: [id number] Date of Birth: Apr 11, 1960

## 2020-02-22 ENCOUNTER — Telehealth (INDEPENDENT_AMBULATORY_CARE_PROVIDER_SITE_OTHER): Payer: Medicare Other | Admitting: Endocrinology

## 2020-02-22 ENCOUNTER — Encounter: Payer: Medicare Other | Admitting: Rehabilitation

## 2020-02-22 ENCOUNTER — Other Ambulatory Visit: Payer: Self-pay

## 2020-02-22 ENCOUNTER — Telehealth: Payer: Self-pay | Admitting: Rehabilitation

## 2020-02-22 DIAGNOSIS — E042 Nontoxic multinodular goiter: Secondary | ICD-10-CM

## 2020-02-22 NOTE — Patient Instructions (Addendum)
check your blood sugar once a day.  vary the time of day when you check, between before the 3 meals, and at bedtime.  also check if you have symptoms of your blood sugar being too high or too low.  please keep a record of the readings and bring it to your next appointment here (or you can bring the meter itself).  You can write it on any piece of paper.  please call us sooner if your blood sugar goes below 70, or if you have a lot of readings over 200.    Please continue the same medications. We should do 1 more ultrasound of the thyroid later this year.   Please come back for a follow-up appointment in 1 month.

## 2020-02-22 NOTE — Progress Notes (Signed)
Subjective:    Patient ID: Rebekah Johnson, female    DOB: 05-25-1960, 60 y.o.   MRN: [id number]  HPI telehealth visit today via telephone x 12 minutes.  Alternatives to telehealth are presented to this patient, and the patient agrees to the telehealth visit. Pt is advised of the cost of the visit, and agrees to this, also.   Patient is in her car, and I am at home.   Persons attending the telehealth visit: the patient and I Pt returns for f/u of diabetes mellitus: DM type: 2 Dx'ed: 3557 Complications: polyneuropathy, PAD, CAD, ESRD (on HD), and foot ulcer.   Therapy: 2 oral meds GDM: G0 DKA: never Severe hypoglycemia: never Pancreatitis: never Pancreatic imaging: normal on 2011 CT Other: she has never been on insulin; fructosamine is c/w A1c approx 1% higher than A1c itself.  Interval history: she says cbg varies from 58-160.  pt states she feels well in general.  Pt says she takes both Glipizide or Januvia.     Pt also has MNG (dx'ed 2011; bx in 2014 was Beth cat 2; f/u US in 2019 was unchanged; she is euthyroid off any rx).  Past Medical History:  Diagnosis Date  . Acute congestive heart failure ([place])   . Acute diastolic CHF (congestive heart failure) ([place]) 09/14/2016  . Atrophic vaginitis 06/07/2012  . Avitaminosis D 07/28/2012  . CAD (coronary artery disease)    a. NSTEM 09/2016: cath showing severe diffuse disease of the RCA, ramus and Cx with mild-mod disease of LAD; PCI would require multiple stents and significant contrast usage thus medical therapy recommended.  . Cellulitis 07/12/2015  . Chest pain 01/01/2017  . Chronic diastolic CHF (congestive heart failure) ([place])   . Chronic kidney disease 09/23/2016  . CKD (chronic kidney disease), stage IV ([place])   . Constipation   . COPD GOLD  0 08/31/2017   Quit smoking 07/17/17 - Spirometry 09/01/2017  FEV1 1.23 (58%)  Ratio 57 with mild curvature p no rx - 09/01/2017  After extensive coaching inhaler device,  effectiveness =    75%  with elipta with cough provoked  - PFT's  10/14/2017  FEV1 1.69 (68 % ) ratio 84  p no % improvement from saba p nothing prior to study with DLCO  50 % corrects to 76  % for alv volume   - 10/14/2017  Walked RA x 3 laps @ 1  . Current tobacco use 06/10/2012  . Diabetes mellitus with neuropathy ([place])   . Diabetic foot ulcer ([place]) 07/13/2015  . Diabetic polyneuropathy associated with type 2 diabetes mellitus ([place]) 08/19/2017  . DOE (dyspnea on exertion) 09/01/2017   09/01/2017  Walked RA x 3 laps @ 185 ft each stopped due to  End of study, nl to mod fast  pace, no  desat   But stopped to rest x 2  Spirometry 09/01/2017  FEV1 1.23 (58%)  Ratio 57  > trial of anoro    . Dyspnea    with exertion  . ESRD (end stage renal disease) ([place])    MWF - [facility]  . Essential (primary) hypertension 06/10/2012  . Foot amputation status    a. h/o left foot transmetatarsal amputation  . Headache    Migraines  . High cholesterol   . History of biliary T-tube placement 06/10/2012   Patient unaware   . History of cardiac arrest   . HLD (hyperlipidemia) 09/02/2012   Overview:  ICD-10 cut over    .  Hypertension    a. patent renal arteries by PV angio 08/2015. b. heavy proteinuria 09/2016 ? nephrotic.  [name] Hypertensive heart disease with heart failure ([place])   . Hypertriglyceridemia 07/28/2012  . Leg pain 06/10/2012  . Normocytic anemia    pt denies this dx  . NSTEMI (non-ST elevated myocardial infarction) ([place]) 01/01/2017  . Obesity   . Onychomycosis 12/24/2015  . Peripheral nerve disease 09/02/2012   legs  . Peripheral vascular disease ([place]) 07/28/2012  . Pneumonia 2018  . Proteinuria   . PVD (peripheral vascular disease) ([place])    a. status post left SFA and popliteal stent and left SFA PTA with drug coated balloon in 08/2015.  . Status post transmetatarsal amputation of foot, left ([place]) 09/06/2015  . Tobacco abuse    quit cigarettes 2019, smokes marijuana  . Type 2 diabetes mellitus ([place]) 06/07/2012  . Type 2  diabetes mellitus with peripheral angiopathy ([place]) 09/02/2012    Past Surgical History:  Procedure Laterality Date  . ABDOMINAL AORTOGRAM W/LOWER EXTREMITY Left 09/25/2019   Procedure: ABDOMINAL AORTOGRAM W/LOWER EXTREMITY;  Surgeon: Waynetta Sandy, MD;  Location: [facility];  Service: Cardiovascular;  Laterality: Left;  . ABDOMINAL HYSTERECTOMY    . AMPUTATION Left 09/06/2015   Procedure: LEFT TRANSMETATARSAL AMPUTATTION;  Surgeon: Newt Minion, MD;  Location: [facility];  Service: Orthopedics;  Laterality: Left;  . AMPUTATION Left 10/18/2019   Procedure: LEFT BELOW KNEE AMPUTATION;  Surgeon: Newt Minion, MD;  Location: [facility];  Service: Orthopedics;  Laterality: Left;  . AV FISTULA PLACEMENT Left 12/13/2018   Procedure: ARTERIOVENOUS (AV) FISTULA CREATION LEFT UPPER ARM;  Surgeon: Waynetta Sandy, MD;  Location: [facility];  Service: Vascular;  Laterality: Left;  . BASCILIC VEIN TRANSPOSITION Left 03/30/2019   Procedure: LEFT ARM ARTERIOVENOUS VEIN TRANSPOSITION;  Surgeon: Serafina Mitchell, MD;  Location: [facility];  Service: Vascular;  Laterality: Left;  . COLONOSCOPY     polyps x 1  . LEFT HEART CATH AND CORONARY ANGIOGRAPHY N/A 09/17/2016   Procedure: LEFT HEART CATH AND CORONARY ANGIOGRAPHY;  Surgeon: Jettie Booze, MD;  Location: [facility];  Service: Cardiovascular;  Laterality: N/A;  . PERIPHERAL VASCULAR ATHERECTOMY Left 09/25/2019   Procedure: PERIPHERAL VASCULAR ATHERECTOMY;  Surgeon: Waynetta Sandy, MD;  Location: [facility];  Service: Cardiovascular;  Laterality: Left;  SFA  . PERIPHERAL VASCULAR CATHETERIZATION N/A 08/16/2015   Procedure: Abdominal Aortogram;  Surgeon: Elam Dutch, MD;  Location: [facility];  Service: Cardiovascular;  Laterality: N/A;  . PERIPHERAL VASCULAR CATHETERIZATION Bilateral 08/16/2015   Procedure: Lower Extremity Angiography;  Surgeon: Elam Dutch, MD;  Location: [facility];  Service:  Cardiovascular;  Laterality: Bilateral;  . PERIPHERAL VASCULAR CATHETERIZATION Left 08/16/2015   Procedure: Peripheral Vascular Intervention;  Surgeon: Elam Dutch, MD;  Location: [facility];  Service: Cardiovascular;  Laterality: Left;  SFA STENT X 2  . PERIPHERAL VASCULAR INTERVENTION Left 09/25/2019   Procedure: PERIPHERAL VASCULAR INTERVENTION;  Surgeon: Waynetta Sandy, MD;  Location: [facility];  Service: Cardiovascular;  Laterality: Left;  SFA    Social History   Socioeconomic History  . Marital status: Legally Separated    Spouse name: Not on file  . Number of children: Not on file  . Years of education: Not on file  . Highest education level: Not on file  Occupational History  . Not on file  Tobacco Use  . Smoking  status: Former Smoker    Packs/day: 1.00    Years: 42.00    Pack years: 42.00    Types: Cigarettes    Quit date: 07/17/2017    Years since quitting: 2.6  . Smokeless tobacco: Never Used  Vaping Use  . Vaping Use: Never used  Substance and Sexual Activity  . Alcohol use: Not Currently    Alcohol/week: 1.0 - 2.0 standard drink    Types: 1 - 2 Glasses of wine per week    Comment: on social occassions  . Drug use: Yes    Frequency: 7.0 times per week    Types: Marijuana    Comment: daily use - last use 03/28/19  . Sexual activity: Not on file    Comment: Hysterectomy  Other Topics Concern  . Not on file  Social History Narrative  . Not on file   Social Determinants of Health   Financial Resource Strain: Not on file  Food Insecurity: Not on file  Transportation Needs: Not on file  Physical Activity: Not on file  Stress: Not on file  Social Connections: Not on file  Intimate Partner Violence: Not on file    Current Outpatient Medications on File Prior to Visit  Medication Sig Dispense Refill  . amLODipine (NORVASC) 5 MG tablet Take 1 tablet (5 mg total) by mouth daily. 30 tablet 11  . Ascorbic Acid (VITAMIN C) 1000 MG  tablet Take 1,000 mg by mouth daily.    [name] aspirin 81 MG EC tablet Take 1 tablet (81 mg total) by mouth daily. Reported on 07/13/2015 90 tablet 0  . atorvastatin (LIPITOR) 80 MG tablet Take 1 tablet (80 mg total) by mouth daily. 90 tablet 2  . Blood Glucose Monitoring Suppl (ACCU-CHEK AVIVA) device Use as instructed three times daily. (Patient taking differently: Use as instructed twice times daily.) 1 each 0  . carvedilol (COREG) 25 MG tablet Take 1 tablet (25 mg total) by mouth 2 (two) times daily. 180 tablet 2  . cholecalciferol (VITAMIN D3) 25 MCG (1000 UT) tablet Take 1,000 Units by mouth daily.    . clopidogrel (PLAVIX) 75 MG tablet Take 1 tablet (75 mg total) by mouth daily. 90 tablet 1  . docusate sodium (COLACE) 100 MG capsule Take 200 mg by mouth 2 (two) times daily.     . furosemide (LASIX) 80 MG tablet Take 80 mg by mouth 2 (two) times daily.    [name] gabapentin (NEURONTIN) 300 MG capsule Take 1 capsule (300 mg total) by mouth 3 (three) times daily. 90 capsule 3  . glipiZIDE (GLUCOTROL) 5 MG tablet Take 0.5 tablets (2.5 mg total) by mouth daily before breakfast. (Patient taking differently: Take 5 mg by mouth daily before breakfast.) 30 tablet 3  . glucose blood (ACCU-CHEK AVIVA) test strip Use as instructed three times daily before meals. 100 each 12  . hydrALAZINE (APRESOLINE) 50 MG tablet Take 50 mg by mouth 3 (three) times daily.    [name] HYDROcodone-acetaminophen (NORCO/VICODIN) 5-325 MG tablet Take 1 tablet by mouth every 6 (six) hours as needed for moderate pain. 20 tablet 0  . isosorbide mononitrate (IMDUR) 30 MG 24 hr tablet Take 60 mg by mouth daily.    [name] lactulose (CHRONULAC) 10 GM/15ML solution Take 15 mLs (10 g total) by mouth 3 (three) times daily. 236 mL 0  . Lancet Devices (ACCU-CHEK SOFTCLIX) lancets Use as instructed three times daily before meals. 1 each 5  . latanoprost (XALATAN) 0.005 % ophthalmic solution Place 1  drop into both eyes at bedtime.     [name] linaclotide (LINZESS) 145  MCG CAPS capsule Take 1 capsule (145 mcg total) by mouth daily before breakfast. Take ~ 30 minutes before breakfast, on an empty stomach 30 capsule 0  . nitroGLYCERIN (NITRODUR - DOSED IN MG/24 HR) 0.2 mg/hr patch Place 1 patch (0.2 mg total) onto the skin daily. 30 patch 12  . nitroGLYCERIN (NITROSTAT) 0.4 MG SL tablet Place 1 tablet (0.4 mg total) under the tongue every 5 (five) minutes as needed for chest pain. 25 tablet 3  . Omega-3 Fatty Acids (FISH OIL) 1000 MG CAPS Take 1,000 mg by mouth daily.     [name] oxyCODONE-acetaminophen (PERCOCET/ROXICET) 5-325 MG tablet Take 1 tablet by mouth every 8 (eight) hours as needed for severe pain. 30 tablet 0  . ranolazine (RANEXA) 500 MG 12 hr tablet Take 1 tablet by mouth twice daily 180 tablet 1  . sevelamer carbonate (RENVELA) 800 MG tablet Take 800 mg by mouth See admin instructions. Three times daily with meals and with snack.    . silver sulfADIAZINE (SILVADENE) 1 % cream Apply 1 application topically daily. Apply to affected area daily plus dry dressing (Patient taking differently: Apply 1 application topically daily as needed (wound care).) 400 g 3  . sitaGLIPtin (JANUVIA) 25 MG tablet Take 1 tablet (25 mg total) by mouth daily. 30 tablet 3  . ZOFRAN 4 MG tablet Take 4 mg by mouth every 8 (eight) hours as needed for nausea/vomiting.     Current Facility-Administered Medications on File Prior to Visit  Medication Dose Route Frequency Provider Last Rate Last Admin  . 0.9 %  sodium chloride infusion  250 mL Intravenous PRN Waynetta Sandy, MD        No Known Allergies  Family History  Problem Relation Age of Onset  . Diabetes Mother   . Hypertension Father   . Colon cancer Neg Hx   . Stomach cancer Neg Hx   . Pancreatic cancer Neg Hx     LMP  (LMP Unknown)    Review of Systems She denies hypoglycemia.      Objective:   Physical Exam    Lab Results  Component Value Date   HGBA1C 6.8 (H) 10/11/2019       Assessment &  Plan:  Type 2 DM, with ESRD. Hypoglycemia, due to insulin: this limits aggressiveness of glycemic control  MNG: she'll be due to f/u US later in 2022.   Patient Instructions  check your blood sugar once a day.  vary the time of day when you check, between before the 3 meals, and at bedtime.  also check if you have symptoms of your blood sugar being too high or too low.  please keep a record of the readings and bring it to your next appointment here (or you can bring the meter itself).  You can write it on any piece of paper.  please call us sooner if your blood sugar goes below 70, or if you have a lot of readings over 200.    Please continue the same medications. We should do 1 more ultrasound of the thyroid later this year.   Please come back for a follow-up appointment in 1 month.

## 2020-02-22 NOTE — Telephone Encounter (Signed)
Pt missed therapy appt today so PT called to check in. Pt reports she forgot appt and had to schedule dialysis today as she is going out of town this weekend.  PT notified her of next appt and pt verbalized understanding.    Cameron Sprang, PT, MPT

## 2020-02-26 ENCOUNTER — Encounter: Payer: Medicare Other | Admitting: Physical Therapy

## 2020-02-27 ENCOUNTER — Encounter: Payer: Medicare Other | Admitting: Physical Therapy

## 2020-02-28 ENCOUNTER — Telehealth: Payer: Self-pay | Admitting: Orthopedic Surgery

## 2020-02-28 ENCOUNTER — Encounter: Payer: Medicare Other | Admitting: Physical Therapy

## 2020-02-28 NOTE — Telephone Encounter (Signed)
She is too far out from surgery. Can refer to chronic pain management

## 2020-02-28 NOTE — Telephone Encounter (Signed)
10/18/19 left BKA requesting refill on pain medication.

## 2020-02-28 NOTE — Telephone Encounter (Signed)
Patient called requesting a refill of oxycodone. Please send to pharmacy on file. Patient phone number is [phone].

## 2020-02-28 NOTE — Telephone Encounter (Signed)
I called and lm on vm to advise of below.

## 2020-02-29 ENCOUNTER — Telehealth: Payer: Self-pay | Admitting: Physical Therapy

## 2020-02-29 ENCOUNTER — Encounter: Payer: Medicare Other | Admitting: Physical Therapy

## 2020-02-29 NOTE — Telephone Encounter (Signed)
Patient's appointments were changed to Tuesday & Thursday which are not dialysis days. Receptionist has left voicemail.  PT left voicemail this morning due to no show appointment and next appointment time with number to call if unable to attend.

## 2020-03-04 ENCOUNTER — Encounter: Payer: Medicare Other | Admitting: Physical Therapy

## 2020-03-05 ENCOUNTER — Encounter: Payer: Medicare Other | Admitting: Physical Therapy

## 2020-03-05 ENCOUNTER — Telehealth: Payer: Self-pay | Admitting: Physical Therapy

## 2020-03-05 NOTE — Telephone Encounter (Signed)
Pt no show for PT appointment today. They were contacted and informed of this via voicemail. They were provided the date and time of their next appointment on voicemail. They were instructed to call us to let us know if they cannot make their appointment.  Elsie Ra, PT, DPT 03/05/20 9:07 AM

## 2020-03-06 ENCOUNTER — Encounter: Payer: Medicare Other | Admitting: Physical Therapy

## 2020-03-07 ENCOUNTER — Encounter: Payer: Medicare Other | Admitting: Physical Therapy

## 2020-03-07 ENCOUNTER — Telehealth: Payer: Self-pay | Admitting: Physical Therapy

## 2020-03-07 NOTE — Telephone Encounter (Signed)
PT left message regarding 3rd no-show appointment and our policy.  She has not been to PT since evaluation on 02/20/2020.  PT explained that we are not able to assist her if she is not present at PT. PT informed her that she has until this afternoon to call PT office to arrange attendance or she will be discharged. If she is discharged and later decides to return to PT then she will need another referral.

## 2020-03-08 DIAGNOSIS — J810 Acute pulmonary edema: Secondary | ICD-10-CM | POA: Insufficient documentation

## 2020-03-11 ENCOUNTER — Encounter: Payer: Medicare Other | Admitting: Physical Therapy

## 2020-03-12 ENCOUNTER — Encounter: Payer: Medicare Other | Admitting: Physical Therapy

## 2020-03-12 ENCOUNTER — Encounter: Payer: Self-pay | Admitting: Physical Therapy

## 2020-03-12 NOTE — Therapy (Signed)
[facility] [address] [place], [place], [address] Phone: [phone]   Fax:  [phone]  Patient Details  Name: Rebekah Johnson MRN: [id number] Date of Birth: 02-28-1960 Referring Provider:  Bevely Palmer Persons, [place]  Encounter Date: 03/12/2020  PHYSICAL THERAPY DISCHARGE SUMMARY  Visits from Start of Care: 1  Current functional level related to goals / functional outcomes: No change as only seen for evaluation on 02/20/2020. She has multiple no-shows and was admitted to [facility] on 03/08/2020 due to shortness of breath.    Remaining deficits: No change noted as not seen since evaluation.    Education / Equipment: Basic prosthetic care at eval.   Plan: Patient agrees to discharge.  Patient goals were not met. Patient is being discharged due to not returning since the last visit.  ?????          Jamey Reas, PT, DPT 03/12/2020, 8:20 AM  [facility] [address] [place], [place], [address] Phone: [phone]   Fax:  [phone]

## 2020-03-13 ENCOUNTER — Encounter: Payer: Medicare Other | Admitting: Physical Therapy

## 2020-03-14 ENCOUNTER — Encounter: Payer: Medicare Other | Admitting: Physical Therapy

## 2020-03-18 ENCOUNTER — Encounter: Payer: Medicare Other | Admitting: Physical Therapy

## 2020-03-19 ENCOUNTER — Encounter: Payer: Medicare Other | Admitting: Physical Therapy

## 2020-03-20 ENCOUNTER — Encounter: Payer: Medicare Other | Admitting: Physical Therapy

## 2020-03-20 ENCOUNTER — Other Ambulatory Visit: Payer: Self-pay

## 2020-03-20 ENCOUNTER — Telehealth: Payer: Self-pay | Admitting: Physician Assistant

## 2020-03-20 DIAGNOSIS — Z89512 Acquired absence of left leg below knee: Secondary | ICD-10-CM

## 2020-03-20 NOTE — Telephone Encounter (Signed)
Continue

## 2020-03-20 NOTE — Telephone Encounter (Signed)
Even with the no shows, still to re-refer her to Rebekah Johnson?

## 2020-03-20 NOTE — Telephone Encounter (Signed)
Patient called. She was discharged from PT. Would like a new PT referral to see Robin.

## 2020-03-20 NOTE — Telephone Encounter (Signed)
  She was discharged from therapy for multiple no-shows.  She needs to see if therapy will allow her to come back.  I am not sure what the policy is.  She has a referral they just stopped seeing her because she did not come in for her visits

## 2020-03-21 ENCOUNTER — Encounter: Payer: Medicare Other | Admitting: Physical Therapy

## 2020-03-25 ENCOUNTER — Encounter: Payer: Medicare Other | Admitting: Physical Therapy

## 2020-03-26 ENCOUNTER — Ambulatory Visit: Payer: Self-pay

## 2020-03-26 ENCOUNTER — Encounter: Payer: Medicare Other | Admitting: Physical Therapy

## 2020-03-26 ENCOUNTER — Ambulatory Visit (INDEPENDENT_AMBULATORY_CARE_PROVIDER_SITE_OTHER): Payer: Medicare Other | Admitting: Family

## 2020-03-26 ENCOUNTER — Encounter: Payer: Self-pay | Admitting: Family

## 2020-03-26 VITALS — BP 190/86 | HR 103 | Temp 98.9°F

## 2020-03-26 DIAGNOSIS — M79674 Pain in right toe(s): Secondary | ICD-10-CM

## 2020-03-26 DIAGNOSIS — I739 Peripheral vascular disease, unspecified: Secondary | ICD-10-CM | POA: Diagnosis not present

## 2020-03-26 MED ORDER — OXYCODONE-ACETAMINOPHEN 5-325 MG PO TABS
1.0000 | ORAL_TABLET | Freq: Three times a day (TID) | ORAL | 0 refills | Status: DC | PRN
Start: 2020-03-26 — End: 2020-04-01

## 2020-03-26 MED ORDER — DOXYCYCLINE HYCLATE 100 MG PO TABS: 100.0000 mg | ORAL_TABLET | Freq: Two times a day (BID) | ORAL | 0 refills | Status: DC

## 2020-03-26 NOTE — Progress Notes (Signed)
Office Visit Note   Patient: Rebekah Johnson           Date of Birth: [birth year]           MRN: [id number] Visit Date: 03/26/2020              Requested by: No referring provider defined for this encounter. PCP: Patient, No Pcp Per  Chief Complaint  Patient presents with  . Right Foot - Pain    Great toe pain      HPI: Patient is a 60 year old woman who presents today complaining of right great toe pain.  Pointing to the MTP joint along the medial column.  She is crying and writhing about in pain.  States she has nothing for pain she is concerned for infection of her foot.  There is no wound.  She denies any drainage denies any fever or chills.    Assessment & Plan: Visit Diagnoses:  1. Peripheral vascular disease ([place])   2. Great toe pain, right     Plan: Rebekah Johnson is afebrile today.  Her blood pressure is quite high she states this is normal for her when she is having pain she is not concerned about her vital signs discussed transferring her to the emergency department for further work-up she declined today.  We will call in a prescription for Bactrim and Percocet she will follow-up with [place] in 1 week.  Discussed strict return precautions.  Follow-Up Instructions: Return in about 9 days (around 04/04/2020).   Ortho Exam  Patient is alert, oriented, no adenopathy, well-dressed, normal affect, normal respiratory effort.  On exam examination of the right foot she does have callused ulceration over the medial aspect of her great toe this was debrided with a 10 blade knife back to viable tissue there is central ulceration this is 6 mm in diameter 1 mm deep filled in with fibrinous exudative tissue this does not probe there is no surrounding erythema no drainage her entire great toe is exquisitely tender there is no fluctuance no sign of abscess  She does vomit while I am examining her this is baseline for the patient has happened in the past.  She states she typically vomits several  times every week  Imaging: No results found. No images are attached to the encounter.  Labs: Lab Results  Component Value Date   HGBA1C 6.8 (H) 10/11/2019   HGBA1C 6.9 (A) 02/02/2019   HGBA1C 5.6 11/01/2018   ESRSEDRATE 57 (H) 07/13/2015   CRP 0.7 07/13/2015   LABURIC 5.7 07/13/2015   REPTSTATUS 10/16/2019 FINAL 10/11/2019   CULT  10/11/2019    NO GROWTH 5 DAYS Performed at [facility], [place] [address], [place], [place] [address]      Lab Results  Component Value Date   ALBUMIN 2.2 (L) 10/16/2019   ALBUMIN 2.3 (L) 10/14/2019   ALBUMIN 2.4 (L) 10/12/2019    Lab Results  Component Value Date   MG 1.7 01/01/2017   MG 1.7 09/16/2016   MG 1.7 09/14/2016   No results found for: VD25OH  No results found for: PREALBUMIN CBC EXTENDED Latest Ref Rng & Units 10/18/2019 10/16/2019 10/15/2019  WBC 4.0 - 10.5 K/uL - 9.4 9.7  RBC 3.87 - 5.11 MIL/uL - 3.48(L) 3.77(L)  HGB 12.0 - 15.0 g/dL 12.2 10.0(L) 10.4(L)  HCT 36.0 - 46.0 % 36.0 32.0(L) 34.1(L)  PLT 150 - 400 K/uL - 278 300  NEUTROABS 1.7 - 7.7 K/uL - 6.0 6.3  LYMPHSABS  0.7 - 4.0 K/uL - 2.0 2.1     There is no height or weight on file to calculate BMI.  Orders:  Orders Placed This Encounter  Procedures  . XR Toe Great Right   No orders of the defined types were placed in this encounter.    Procedures: No procedures performed  Clinical Data: No additional findings.  ROS:  All other systems negative, except as noted in the HPI. Review of Systems  Constitutional: Negative for chills and fever.  Cardiovascular: Negative for leg swelling.  Skin: Positive for wound. Negative for color change.    Objective: Vital Signs: LMP  (LMP Unknown)   Specialty Comments:  No specialty comments available.  PMFS History: Patient Active Problem List   Diagnosis Date Noted  . Left below-knee amputee ([place]) 10/24/2019  . Pyogenic inflammation of bone ([place])   . Non-pressure chronic ulcer of other part of left  foot limited to breakdown of skin ([place])   . Osteomyelitis of foot, acute ([place]) 10/11/2019  . Cutaneous abscess of left foot 10/04/2019  . Allergy, unspecified, initial encounter 08/07/2019  . Anaphylactic shock, unspecified, initial encounter 08/07/2019  . Constipation 08/04/2019  . Nausea without vomiting 08/04/2019  . Mild protein-calorie malnutrition ([place]) 11/29/2018  . Anemia in chronic kidney disease 10/04/2018  . Coagulation defect, unspecified ([place]) 10/04/2018  . Iron deficiency anemia, unspecified 10/04/2018  . Secondary hyperparathyroidism of renal origin ([place]) 10/04/2018  . Multinodular goiter 08/31/2018  . History of influenza 01/08/2018  . Cardiac arrest ([place]) 11/22/2017  . DOE (dyspnea on exertion) 09/01/2017  . COPD GOLD  0 08/31/2017  . Diabetic polyneuropathy associated with type 2 diabetes mellitus ([place]) 08/19/2017  . ESRD (end stage renal disease) on dialysis ([place]) 03/31/2017  . Neovascular glaucoma of right eye, moderate stage 03/31/2017  . NSTEMI (non-ST elevated myocardial infarction) ([place]) 01/01/2017  . Chest pain 01/01/2017  . Tobacco abuse   . PVD (peripheral vascular disease) ([place])   . Proteinuria   . Obesity   . Normocytic anemia   . Hypertension   . High cholesterol   . Foot amputation status   . Diabetes mellitus with neuropathy ([place])   . CKD (chronic kidney disease), stage IV ([place])   . Chronic diastolic CHF (congestive heart failure) ([place])   . CAD (coronary artery disease) 10/27/2016  . Chronic kidney disease 09/23/2016  . Acute congestive heart failure ([place])   . Hypertensive heart disease with heart failure ([place])   . Chronic combined systolic and diastolic heart failure ([place]) 09/14/2016  . Onychomycosis 12/24/2015  . Status post transmetatarsal amputation of foot, left ([place]) 09/06/2015  . Diabetic foot ulcer ([place]) 07/13/2015  . Cellulitis 07/12/2015  . HLD (hyperlipidemia) 09/02/2012  . Peripheral nerve disease 09/02/2012  . Type 2 diabetes  mellitus with peripheral angiopathy ([place]) 09/02/2012  . Hypertriglyceridemia 07/28/2012  . Peripheral vascular disease ([place]) 07/28/2012  . Avitaminosis D 07/28/2012  . Essential (primary) hypertension 06/10/2012  . History of biliary T-tube placement 06/10/2012  . Leg pain 06/10/2012  . Current tobacco use 06/10/2012  . Atrophic vaginitis 06/07/2012  . Type 2 diabetes mellitus ([place]) 06/07/2012   Past Medical History:  Diagnosis Date  . Acute congestive heart failure ([place])   . Acute diastolic CHF (congestive heart failure) ([place]) 09/14/2016  . Atrophic vaginitis 06/07/2012  . Avitaminosis D 07/28/2012  . CAD (coronary artery disease)    a. NSTEM 09/2016: cath showing severe diffuse disease of the RCA, ramus and Cx with mild-mod disease of  LAD; PCI would require multiple stents and significant contrast usage thus medical therapy recommended.  . Cellulitis 07/12/2015  . Chest pain 01/01/2017  . Chronic diastolic CHF (congestive heart failure) ([place])   . Chronic kidney disease 09/23/2016  . CKD (chronic kidney disease), stage IV ([place])   . Constipation   . COPD GOLD  0 08/31/2017   Quit smoking 07/17/17 - Spirometry 09/01/2017  FEV1 1.23 (58%)  Ratio 57 with mild curvature p no rx - 09/01/2017  After extensive coaching inhaler device,  effectiveness =    75% with elipta with cough provoked  - PFT's  10/14/2017  FEV1 1.69 (68 % ) ratio 84  p no % improvement from saba p nothing prior to study with DLCO  50 % corrects to 76  % for alv volume   - 10/14/2017  Walked RA x 3 laps @ 1  . Current tobacco use 06/10/2012  . Diabetes mellitus with neuropathy ([place])   . Diabetic foot ulcer ([place]) 07/13/2015  . Diabetic polyneuropathy associated with type 2 diabetes mellitus ([place]) 08/19/2017  . DOE (dyspnea on exertion) 09/01/2017   09/01/2017  Walked RA x 3 laps @ 185 ft each stopped due to  End of study, nl to mod fast  pace, no  desat   But stopped to rest x 2  Spirometry 09/01/2017  FEV1 1.23 (58%)  Ratio 57  > trial of  anoro    . Dyspnea    with exertion  . ESRD (end stage renal disease) ([place])    MWF - [facility]  . Essential (primary) hypertension 06/10/2012  . Foot amputation status    a. h/o left foot transmetatarsal amputation  . Headache    Migraines  . High cholesterol   . History of biliary T-tube placement 06/10/2012   Patient unaware   . History of cardiac arrest   . HLD (hyperlipidemia) 09/02/2012   Overview:  ICD-10 cut over    . Hypertension    a. patent renal arteries by PV angio 08/2015. b. heavy proteinuria 09/2016 ? nephrotic.  [name] Hypertensive heart disease with heart failure ([place])   . Hypertriglyceridemia 07/28/2012  . Leg pain 06/10/2012  . Normocytic anemia    pt denies this dx  . NSTEMI (non-ST elevated myocardial infarction) ([place]) 01/01/2017  . Obesity   . Onychomycosis 12/24/2015  . Peripheral nerve disease 09/02/2012   legs  . Peripheral vascular disease ([place]) 07/28/2012  . Pneumonia 2018  . Proteinuria   . PVD (peripheral vascular disease) ([place])    a. status post left SFA and popliteal stent and left SFA PTA with drug coated balloon in 08/2015.  . Status post transmetatarsal amputation of foot, left ([place]) 09/06/2015  . Tobacco abuse    quit cigarettes 2019, smokes marijuana  . Type 2 diabetes mellitus ([place]) 06/07/2012  . Type 2 diabetes mellitus with peripheral angiopathy ([place]) 09/02/2012    Family History  Problem Relation Age of Onset  . Diabetes Mother   . Hypertension Father   . Colon cancer Neg Hx   . Stomach cancer Neg Hx   . Pancreatic cancer Neg Hx     Past Surgical History:  Procedure Laterality Date  . ABDOMINAL AORTOGRAM W/LOWER EXTREMITY Left 09/25/2019   Procedure: ABDOMINAL AORTOGRAM W/LOWER EXTREMITY;  Surgeon: Waynetta Sandy, MD;  Location: [facility];  Service: Cardiovascular;  Laterality: Left;  . ABDOMINAL HYSTERECTOMY    . AMPUTATION Left 09/06/2015   Procedure: LEFT TRANSMETATARSAL AMPUTATTION;  Surgeon:  Newt Minion, MD;  Location:  [facility];  Service: Orthopedics;  Laterality: Left;  . AMPUTATION Left 10/18/2019   Procedure: LEFT BELOW KNEE AMPUTATION;  Surgeon: Newt Minion, MD;  Location: [facility];  Service: Orthopedics;  Laterality: Left;  . AV FISTULA PLACEMENT Left 12/13/2018   Procedure: ARTERIOVENOUS (AV) FISTULA CREATION LEFT UPPER ARM;  Surgeon: Waynetta Sandy, MD;  Location: [facility];  Service: Vascular;  Laterality: Left;  . BASCILIC VEIN TRANSPOSITION Left 03/30/2019   Procedure: LEFT ARM ARTERIOVENOUS VEIN TRANSPOSITION;  Surgeon: Serafina Mitchell, MD;  Location: [facility];  Service: Vascular;  Laterality: Left;  . COLONOSCOPY     polyps x 1  . LEFT HEART CATH AND CORONARY ANGIOGRAPHY N/A 09/17/2016   Procedure: LEFT HEART CATH AND CORONARY ANGIOGRAPHY;  Surgeon: Jettie Booze, MD;  Location: [facility];  Service: Cardiovascular;  Laterality: N/A;  . PERIPHERAL VASCULAR ATHERECTOMY Left 09/25/2019   Procedure: PERIPHERAL VASCULAR ATHERECTOMY;  Surgeon: Waynetta Sandy, MD;  Location: [facility];  Service: Cardiovascular;  Laterality: Left;  SFA  . PERIPHERAL VASCULAR CATHETERIZATION N/A 08/16/2015   Procedure: Abdominal Aortogram;  Surgeon: Elam Dutch, MD;  Location: [facility];  Service: Cardiovascular;  Laterality: N/A;  . PERIPHERAL VASCULAR CATHETERIZATION Bilateral 08/16/2015   Procedure: Lower Extremity Angiography;  Surgeon: Elam Dutch, MD;  Location: [facility];  Service: Cardiovascular;  Laterality: Bilateral;  . PERIPHERAL VASCULAR CATHETERIZATION Left 08/16/2015   Procedure: Peripheral Vascular Intervention;  Surgeon: Elam Dutch, MD;  Location: [facility];  Service: Cardiovascular;  Laterality: Left;  SFA STENT X 2  . PERIPHERAL VASCULAR INTERVENTION Left 09/25/2019   Procedure: PERIPHERAL VASCULAR INTERVENTION;  Surgeon: Waynetta Sandy, MD;  Location: [facility];  Service: Cardiovascular;  Laterality: Left;  SFA    Social History   Occupational History  . Not on file  Tobacco Use  . Smoking status: Former Smoker    Packs/day: 1.00    Years: 42.00    Pack years: 42.00    Types: Cigarettes    Quit date: 07/17/2017    Years since quitting: 2.6  . Smokeless tobacco: Never Used  Vaping Use  . Vaping Use: Never used  Substance and Sexual Activity  . Alcohol use: Not Currently    Alcohol/week: 1.0 - 2.0 standard drink    Types: 1 - 2 Glasses of wine per week    Comment: on social occassions  . Drug use: Yes    Frequency: 7.0 times per week    Types: Marijuana    Comment: daily use - last use 03/28/19  . Sexual activity: Not on file    Comment: Hysterectomy

## 2020-03-27 ENCOUNTER — Encounter: Payer: Medicare Other | Admitting: Physical Therapy

## 2020-03-27 ENCOUNTER — Observation Stay (HOSPITAL_COMMUNITY): Payer: Medicare Other

## 2020-03-27 ENCOUNTER — Encounter (HOSPITAL_COMMUNITY): Payer: Self-pay

## 2020-03-27 ENCOUNTER — Other Ambulatory Visit: Payer: Self-pay

## 2020-03-27 ENCOUNTER — Inpatient Hospital Stay (HOSPITAL_COMMUNITY)
Admission: EM | Admit: 2020-03-27 | Discharge: 2020-04-01 | DRG: 252 | Disposition: A | Discharge status: Home / Self Care | Payer: Medicare Other | Attending: Internal Medicine | Admitting: Internal Medicine

## 2020-03-27 ENCOUNTER — Emergency Department (HOSPITAL_COMMUNITY): Payer: Medicare Other

## 2020-03-27 DIAGNOSIS — Z20822 Contact with and (suspected) exposure to covid-19: Secondary | ICD-10-CM | POA: Diagnosis present

## 2020-03-27 DIAGNOSIS — E876 Hypokalemia: Secondary | ICD-10-CM | POA: Diagnosis present

## 2020-03-27 DIAGNOSIS — Z89512 Acquired absence of left leg below knee: Secondary | ICD-10-CM

## 2020-03-27 DIAGNOSIS — Z992 Dependence on renal dialysis: Secondary | ICD-10-CM

## 2020-03-27 DIAGNOSIS — M79674 Pain in right toe(s): Secondary | ICD-10-CM | POA: Insufficient documentation

## 2020-03-27 DIAGNOSIS — E8889 Other specified metabolic disorders: Secondary | ICD-10-CM | POA: Diagnosis present

## 2020-03-27 DIAGNOSIS — Z8674 Personal history of sudden cardiac arrest: Secondary | ICD-10-CM

## 2020-03-27 DIAGNOSIS — I130 Hypertensive heart and chronic kidney disease with heart failure and stage 1 through stage 4 chronic kidney disease, or unspecified chronic kidney disease: Secondary | ICD-10-CM

## 2020-03-27 DIAGNOSIS — I5033 Acute on chronic diastolic (congestive) heart failure: Secondary | ICD-10-CM

## 2020-03-27 DIAGNOSIS — Z8711 Personal history of peptic ulcer disease: Secondary | ICD-10-CM

## 2020-03-27 DIAGNOSIS — Z87891 Personal history of nicotine dependence: Secondary | ICD-10-CM

## 2020-03-27 DIAGNOSIS — M869 Osteomyelitis, unspecified: Secondary | ICD-10-CM | POA: Diagnosis present

## 2020-03-27 DIAGNOSIS — Z7902 Long term (current) use of antithrombotics/antiplatelets: Secondary | ICD-10-CM

## 2020-03-27 DIAGNOSIS — L97519 Non-pressure chronic ulcer of other part of right foot with unspecified severity: Secondary | ICD-10-CM | POA: Diagnosis present

## 2020-03-27 DIAGNOSIS — Z90710 Acquired absence of both cervix and uterus: Secondary | ICD-10-CM

## 2020-03-27 DIAGNOSIS — R42 Dizziness and giddiness: Secondary | ICD-10-CM | POA: Diagnosis not present

## 2020-03-27 DIAGNOSIS — E119 Type 2 diabetes mellitus without complications: Secondary | ICD-10-CM

## 2020-03-27 DIAGNOSIS — I252 Old myocardial infarction: Secondary | ICD-10-CM

## 2020-03-27 DIAGNOSIS — I10 Essential (primary) hypertension: Secondary | ICD-10-CM | POA: Diagnosis present

## 2020-03-27 DIAGNOSIS — R52 Pain, unspecified: Secondary | ICD-10-CM | POA: Diagnosis not present

## 2020-03-27 DIAGNOSIS — Z8249 Family history of ischemic heart disease and other diseases of the circulatory system: Secondary | ICD-10-CM

## 2020-03-27 DIAGNOSIS — E785 Hyperlipidemia, unspecified: Secondary | ICD-10-CM

## 2020-03-27 DIAGNOSIS — I493 Ventricular premature depolarization: Secondary | ICD-10-CM | POA: Diagnosis not present

## 2020-03-27 DIAGNOSIS — I5032 Chronic diastolic (congestive) heart failure: Secondary | ICD-10-CM | POA: Diagnosis present

## 2020-03-27 DIAGNOSIS — E1169 Type 2 diabetes mellitus with other specified complication: Secondary | ICD-10-CM | POA: Diagnosis present

## 2020-03-27 DIAGNOSIS — E1140 Type 2 diabetes mellitus with diabetic neuropathy, unspecified: Secondary | ICD-10-CM

## 2020-03-27 DIAGNOSIS — Z79899 Other long term (current) drug therapy: Secondary | ICD-10-CM

## 2020-03-27 DIAGNOSIS — E1122 Type 2 diabetes mellitus with diabetic chronic kidney disease: Secondary | ICD-10-CM | POA: Diagnosis present

## 2020-03-27 DIAGNOSIS — L089 Local infection of the skin and subcutaneous tissue, unspecified: Secondary | ICD-10-CM | POA: Diagnosis present

## 2020-03-27 DIAGNOSIS — Z7982 Long term (current) use of aspirin: Secondary | ICD-10-CM

## 2020-03-27 DIAGNOSIS — Z7984 Long term (current) use of oral hypoglycemic drugs: Secondary | ICD-10-CM

## 2020-03-27 DIAGNOSIS — E1142 Type 2 diabetes mellitus with diabetic polyneuropathy: Secondary | ICD-10-CM | POA: Diagnosis present

## 2020-03-27 DIAGNOSIS — N186 End stage renal disease: Secondary | ICD-10-CM | POA: Diagnosis present

## 2020-03-27 DIAGNOSIS — E1151 Type 2 diabetes mellitus with diabetic peripheral angiopathy without gangrene: Principal | ICD-10-CM | POA: Diagnosis present

## 2020-03-27 DIAGNOSIS — F410 Panic disorder [episodic paroxysmal anxiety] without agoraphobia: Secondary | ICD-10-CM | POA: Diagnosis not present

## 2020-03-27 DIAGNOSIS — I2510 Atherosclerotic heart disease of native coronary artery without angina pectoris: Secondary | ICD-10-CM | POA: Diagnosis present

## 2020-03-27 DIAGNOSIS — I739 Peripheral vascular disease, unspecified: Secondary | ICD-10-CM | POA: Diagnosis present

## 2020-03-27 DIAGNOSIS — Z833 Family history of diabetes mellitus: Secondary | ICD-10-CM

## 2020-03-27 DIAGNOSIS — G8929 Other chronic pain: Secondary | ICD-10-CM | POA: Diagnosis present

## 2020-03-27 DIAGNOSIS — R008 Other abnormalities of heart beat: Secondary | ICD-10-CM | POA: Diagnosis not present

## 2020-03-27 DIAGNOSIS — I132 Hypertensive heart and chronic kidney disease with heart failure and with stage 5 chronic kidney disease, or end stage renal disease: Secondary | ICD-10-CM | POA: Diagnosis present

## 2020-03-27 DIAGNOSIS — L03115 Cellulitis of right lower limb: Secondary | ICD-10-CM | POA: Diagnosis present

## 2020-03-27 DIAGNOSIS — D631 Anemia in chronic kidney disease: Secondary | ICD-10-CM | POA: Diagnosis present

## 2020-03-27 DIAGNOSIS — K5909 Other constipation: Secondary | ICD-10-CM | POA: Diagnosis present

## 2020-03-27 DIAGNOSIS — I70235 Atherosclerosis of native arteries of right leg with ulceration of other part of foot: Secondary | ICD-10-CM | POA: Diagnosis present

## 2020-03-27 DIAGNOSIS — E11628 Type 2 diabetes mellitus with other skin complications: Secondary | ICD-10-CM | POA: Diagnosis present

## 2020-03-27 DIAGNOSIS — R112 Nausea with vomiting, unspecified: Secondary | ICD-10-CM | POA: Diagnosis present

## 2020-03-27 DIAGNOSIS — E11621 Type 2 diabetes mellitus with foot ulcer: Secondary | ICD-10-CM | POA: Diagnosis present

## 2020-03-27 DIAGNOSIS — J449 Chronic obstructive pulmonary disease, unspecified: Secondary | ICD-10-CM | POA: Diagnosis present

## 2020-03-27 DIAGNOSIS — N2581 Secondary hyperparathyroidism of renal origin: Secondary | ICD-10-CM | POA: Diagnosis present

## 2020-03-27 LAB — CBC
HCT: 32.3 % — ABNORMAL LOW (ref 36.0–46.0)
Hemoglobin: 10.1 g/dL — ABNORMAL LOW (ref 12.0–15.0)
MCH: 28.6 pg (ref 26.0–34.0)
MCHC: 31.3 g/dL (ref 30.0–36.0)
MCV: 91.5 fL (ref 80.0–100.0)
Platelets: 269 10*3/uL (ref 150–400)
RBC: 3.53 MIL/uL — ABNORMAL LOW (ref 3.87–5.11)
RDW: 16.6 % — ABNORMAL HIGH (ref 11.5–15.5)
WBC: 20.4 10*3/uL — ABNORMAL HIGH (ref 4.0–10.5)
nRBC: 0 % (ref 0.0–0.2)

## 2020-03-27 LAB — COMPREHENSIVE METABOLIC PANEL
ALT: 26 U/L (ref 0–44)
AST: 20 U/L (ref 15–41)
Albumin: 3.1 g/dL — ABNORMAL LOW (ref 3.5–5.0)
Alkaline Phosphatase: 81 U/L (ref 38–126)
Anion gap: 13 (ref 5–15)
BUN: 46 mg/dL — ABNORMAL HIGH (ref 8–23)
CO2: 26 mmol/L (ref 22–32)
Calcium: 9.1 mg/dL (ref 8.9–10.3)
Chloride: 96 mmol/L — ABNORMAL LOW (ref 98–111)
Creatinine, Ser: 4.07 mg/dL — ABNORMAL HIGH (ref 0.44–1.00)
GFR, Estimated: 12 mL/min — ABNORMAL LOW (ref >60)
Glucose, Bld: 161 mg/dL — ABNORMAL HIGH (ref 70–99)
Potassium: 2.9 mmol/L — ABNORMAL LOW (ref 3.5–5.1)
Sodium: 135 mmol/L (ref 135–145)
Total Bilirubin: 0.8 mg/dL (ref 0.3–1.2)
Total Protein: 6.8 g/dL (ref 6.5–8.1)

## 2020-03-27 LAB — LACTIC ACID, PLASMA
Lactic Acid, Venous: 1.8 mmol/L (ref 0.5–1.9)
Lactic Acid, Venous: 1.3 mmol/L (ref 0.5–1.9)

## 2020-03-27 LAB — RESP PANEL BY RT-PCR (FLU A&B, COVID) ARPGX2
Influenza A by PCR: NEGATIVE
Influenza B by PCR: NEGATIVE
SARS Coronavirus 2 by RT PCR: NEGATIVE

## 2020-03-27 LAB — GLUCOSE, CAPILLARY
Glucose-Capillary: 112 mg/dL — ABNORMAL HIGH (ref 70–99)
Glucose-Capillary: 136 mg/dL — ABNORMAL HIGH (ref 70–99)

## 2020-03-27 LAB — HEMOGLOBIN A1C
Hgb A1c MFr Bld: 4.7 % — ABNORMAL LOW (ref 4.8–5.6)
Mean Plasma Glucose: 88.19 mg/dL

## 2020-03-27 MED ORDER — INSULIN ASPART 100 UNIT/ML ~~LOC~~ SOLN: 0.0000 [IU] | Freq: Three times a day (TID) | SUBCUTANEOUS | Status: DC

## 2020-03-27 MED ORDER — ATROPINE SULFATE 1 % OP SOLN
1.0000 [drp] | Freq: Every day | OPHTHALMIC | Status: DC
  Administered 2020-03-27 – 2020-04-01 (×5): 1 [drp] via OPHTHALMIC
  Filled 2020-03-27 (×2): qty 2

## 2020-03-27 MED ORDER — BRIMONIDINE TARTRATE 0.2 % OP SOLN
1.0000 [drp] | Freq: Three times a day (TID) | OPHTHALMIC | Status: DC
  Administered 2020-03-27 – 2020-04-01 (×13): 1 [drp] via OPHTHALMIC
  Filled 2020-03-27 (×2): qty 5

## 2020-03-27 MED ORDER — CLOPIDOGREL BISULFATE 75 MG PO TABS
75.0000 mg | ORAL_TABLET | Freq: Every day | ORAL | Status: DC
  Administered 2020-03-27 – 2020-03-29 (×3): 75 mg via ORAL
  Filled 2020-03-27 (×3): qty 1

## 2020-03-27 MED ORDER — PIPERACILLIN-TAZOBACTAM 3.375 G IVPB 30 MIN
3.3750 g | Freq: Once | INTRAVENOUS | Status: AC
  Administered 2020-03-27: 3.375 g via INTRAVENOUS
  Filled 2020-03-27: qty 50

## 2020-03-27 MED ORDER — ACETAMINOPHEN 650 MG RE SUPP: 650.0000 mg | Freq: Four times a day (QID) | RECTAL | Status: DC | PRN

## 2020-03-27 MED ORDER — HYDROMORPHONE HCL 2 MG PO TABS
2.0000 mg | ORAL_TABLET | ORAL | Status: DC | PRN
  Administered 2020-03-27 – 2020-04-01 (×17): 2 mg via ORAL
  Filled 2020-03-27 (×17): qty 1

## 2020-03-27 MED ORDER — ATORVASTATIN CALCIUM 80 MG PO TABS
80.0000 mg | ORAL_TABLET | Freq: Every day | ORAL | Status: DC
  Administered 2020-03-27 – 2020-04-01 (×6): 80 mg via ORAL
  Filled 2020-03-27 (×3): qty 1
  Filled 2020-03-27: qty 8
  Filled 2020-03-27 (×3): qty 1

## 2020-03-27 MED ORDER — LATANOPROST 0.005 % OP SOLN
1.0000 [drp] | Freq: Every day | OPHTHALMIC | Status: DC
  Administered 2020-03-27 – 2020-03-31 (×5): 1 [drp] via OPHTHALMIC
  Filled 2020-03-27 (×2): qty 2.5

## 2020-03-27 MED ORDER — CINACALCET HCL 30 MG PO TABS
30.0000 mg | ORAL_TABLET | ORAL | Status: DC
  Administered 2020-03-28 – 2020-03-29 (×2): 30 mg via ORAL
  Filled 2020-03-27 (×2): qty 1

## 2020-03-27 MED ORDER — DORZOLAMIDE HCL-TIMOLOL MAL 2-0.5 % OP SOLN
1.0000 [drp] | Freq: Two times a day (BID) | OPHTHALMIC | Status: DC
  Administered 2020-03-27 – 2020-04-01 (×9): 1 [drp] via OPHTHALMIC
  Filled 2020-03-27 (×2): qty 10

## 2020-03-27 MED ORDER — DOXERCALCIFEROL 4 MCG/2ML IV SOLN
3.0000 ug | INTRAVENOUS | Status: DC
  Filled 2020-03-27 (×2): qty 2

## 2020-03-27 MED ORDER — PIPERACILLIN-TAZOBACTAM IN DEX 2-0.25 GM/50ML IV SOLN
2.2500 g | Freq: Three times a day (TID) | INTRAVENOUS | Status: DC
  Administered 2020-03-27 – 2020-03-30 (×9): 2.25 g via INTRAVENOUS
  Filled 2020-03-27 (×12): qty 50

## 2020-03-27 MED ORDER — HEPARIN SODIUM (PORCINE) 5000 UNIT/ML IJ SOLN
5000.0000 [IU] | Freq: Three times a day (TID) | INTRAMUSCULAR | Status: DC
  Administered 2020-03-27 – 2020-03-30 (×8): 5000 [IU] via SUBCUTANEOUS
  Filled 2020-03-27 (×8): qty 1

## 2020-03-27 MED ORDER — PREDNISOLONE ACETATE 1 % OP SUSP
1.0000 [drp] | Freq: Four times a day (QID) | OPHTHALMIC | Status: DC
  Administered 2020-03-27 – 2020-04-01 (×17): 1 [drp] via OPHTHALMIC
  Filled 2020-03-27 (×2): qty 5

## 2020-03-27 MED ORDER — POTASSIUM CHLORIDE CRYS ER 20 MEQ PO TBCR
20.0000 meq | EXTENDED_RELEASE_TABLET | Freq: Two times a day (BID) | ORAL | Status: AC
  Administered 2020-03-27 (×2): 20 meq via ORAL
  Filled 2020-03-27 (×2): qty 1

## 2020-03-27 MED ORDER — OXYCODONE-ACETAMINOPHEN 5-325 MG PO TABS
1.0000 | ORAL_TABLET | Freq: Once | ORAL | Status: AC
  Administered 2020-03-27: 1 via ORAL
  Filled 2020-03-27: qty 1

## 2020-03-27 MED ORDER — ACETAMINOPHEN 325 MG PO TABS
650.0000 mg | ORAL_TABLET | Freq: Four times a day (QID) | ORAL | Status: DC | PRN
  Administered 2020-03-28 – 2020-03-29 (×3): 650 mg via ORAL
  Filled 2020-03-27 (×3): qty 2

## 2020-03-27 MED ORDER — SUCROFERRIC OXYHYDROXIDE 500 MG PO CHEW
1000.0000 mg | CHEWABLE_TABLET | Freq: Three times a day (TID) | ORAL | Status: DC
  Administered 2020-03-27 – 2020-03-31 (×2): 1000 mg via ORAL
  Filled 2020-03-27 (×15): qty 2

## 2020-03-27 MED ORDER — ASPIRIN EC 81 MG PO TBEC
81.0000 mg | DELAYED_RELEASE_TABLET | Freq: Every day | ORAL | Status: DC
  Administered 2020-03-27 – 2020-03-29 (×3): 81 mg via ORAL
  Filled 2020-03-27 (×3): qty 1

## 2020-03-27 NOTE — H&P (Signed)
Date: 03/27/2020               Patient Name:  Rebekah Johnson MRN: [id number]  DOB: [birth year] Age / Sex: 60 y.o., female   PCP: Patient, No Pcp Per         Medical Service: Internal Medicine Teaching Service         Attending Physician: Dr. Aldine Contes, MD    First Contact: Dr. Konrad Penta Pager: [phone]  Second Contact: Dr. Marianna Payment Pager: [phone]       After Hours (After 5p/  First Contact Pager: [phone]  weekends / holidays): Second Contact Pager: [phone]   Chief Complaint: Right great toe pain  History of Present Illness: This is a 60 year old female with history of ESRD on HD Monday Wednesday Friday, hypertension, CAD status post multiple PCI, HFpEF, PAD, COPD, tobacco use, peptic ulcer disease, left BKA, MGUS, and diabetes mellitus who is presenting with right great toe ulcer pain. Patient states that she noticed her right toe pain on Friday, she does not think she had any trauma to the area.  She states that the pain started getting worse and went to orthopedics yesterday.  She had been given pain medications and antibiotics, she did not start her antibiotics.  She had worsening pain and came into the ER to be evaluated.  She denies any drainage, redness, fevers, chills, chest pain. She reports chronic nausea and emesis, has not changed recently. Also reports chronic occasional shortness of breath that has not worsened. She reported that she went to dialysis on Monday, states she always has issues with finishing her treatment.  In the ED patient was noted to be afebrile, tachypneic up to 24 respiratory rate, hypertensive up to 190/86, 151 over 62.  Labs significant for lactic acid of 1.8.  Sodium 135, potassium 2.9, CO2 26, glucose 161, BUN 46, creatinine 4.7.  CBC showed a WBC of 20.4, hemoglobin 10.1, platelets 269.  X-ray of her right foot showed soft tissue wound, no acute bony abnormality.  Chest x-ray showed a right base opacity could be atelectasis versus early infiltrate.   EKG showed normal sinus rhythm, heart rate 89, normal axis, LVH. Patient was given oxycodone and started on IV Zosyn. Nephrology and IMTS was consulted.   Meds:  Current Facility-Administered Medications for the 03/27/20 encounter [facility] Encounter)  Medication  . 0.9 %  sodium chloride infusion   Current Meds  Medication Sig  . amLODipine (NORVASC) 5 MG tablet Take 1 tablet (5 mg total) by mouth daily.  . Ascorbic Acid (VITAMIN C) 1000 MG tablet Take 1,000 mg by mouth daily.  [name] aspirin 81 MG EC tablet Take 1 tablet (81 mg total) by mouth daily. Reported on 07/13/2015  . atorvastatin (LIPITOR) 80 MG tablet Take 1 tablet (80 mg total) by mouth daily.  [name] atropine 1 % ophthalmic solution Place 1 drop into the left eye daily.  . brimonidine (ALPHAGAN) 0.2 % ophthalmic solution Place 1 drop into both eyes every 8 (eight) hours.  . cholecalciferol (VITAMIN D3) 25 MCG (1000 UT) tablet Take 1,000 Units by mouth daily.  . clopidogrel (PLAVIX) 75 MG tablet Take 1 tablet (75 mg total) by mouth daily.  . dorzolamide-timolol (COSOPT) 22.3-6.8 MG/ML ophthalmic solution Place 1 drop into both eyes 2 (two) times daily.  [name] doxycycline (VIBRA-TABS) 100 MG tablet Take 1 tablet (100 mg total) by mouth 2 (two) times daily.  [name] latanoprost (XALATAN) 0.005 % ophthalmic solution Place 1 drop into both  eyes at bedtime.   . lidocaine-prilocaine (EMLA) cream Apply 1 application topically 3 (three) times a week.  . prednisoLONE acetate (PRED FORTE) 1 % ophthalmic suspension Place 1 drop into the left eye 4 (four) times daily.  . sucroferric oxyhydroxide (VELPHORO) 500 MG chewable tablet Chew 1,000 mg by mouth as directed. Take 2 tablets (1000 mg) TID and Take 2 tablets (1000 mg) with snack     Allergies: Allergies as of 03/27/2020  . (No Known Allergies)   Past Medical History:  Diagnosis Date  . Acute congestive heart failure ([place])   . Acute diastolic CHF (congestive heart failure) ([place]) 09/14/2016  . Atrophic  vaginitis 06/07/2012  . Avitaminosis D 07/28/2012  . CAD (coronary artery disease)    a. NSTEM 09/2016: cath showing severe diffuse disease of the RCA, ramus and Cx with mild-mod disease of LAD; PCI would require multiple stents and significant contrast usage thus medical therapy recommended.  . Cellulitis 07/12/2015  . Chest pain 01/01/2017  . Chronic diastolic CHF (congestive heart failure) ([place])   . Chronic kidney disease 09/23/2016  . CKD (chronic kidney disease), stage IV ([place])   . Constipation   . COPD GOLD  0 08/31/2017   Quit smoking 07/17/17 - Spirometry 09/01/2017  FEV1 1.23 (58%)  Ratio 57 with mild curvature p no rx - 09/01/2017  After extensive coaching inhaler device,  effectiveness =    75% with elipta with cough provoked  - PFT's  10/14/2017  FEV1 1.69 (68 % ) ratio 84  p no % improvement from saba p nothing prior to study with DLCO  50 % corrects to 76  % for alv volume   - 10/14/2017  Walked RA x 3 laps @ 1  . Current tobacco use 06/10/2012  . Diabetes mellitus with neuropathy ([place])   . Diabetic foot ulcer ([place]) 07/13/2015  . Diabetic polyneuropathy associated with type 2 diabetes mellitus ([place]) 08/19/2017  . DOE (dyspnea on exertion) 09/01/2017   09/01/2017  Walked RA x 3 laps @ 185 ft each stopped due to  End of study, nl to mod fast  pace, no  desat   But stopped to rest x 2  Spirometry 09/01/2017  FEV1 1.23 (58%)  Ratio 57  > trial of anoro    . Dyspnea    with exertion  . ESRD (end stage renal disease) ([place])    MWF - [facility]  . Essential (primary) hypertension 06/10/2012  . Foot amputation status    a. h/o left foot transmetatarsal amputation  . Headache    Migraines  . High cholesterol   . History of biliary T-tube placement 06/10/2012   Patient unaware   . History of cardiac arrest   . HLD (hyperlipidemia) 09/02/2012   Overview:  ICD-10 cut over    . Hypertension    a. patent renal arteries by PV angio 08/2015. b. heavy proteinuria 09/2016 ? nephrotic.  [name] Hypertensive heart  disease with heart failure ([place])   . Hypertriglyceridemia 07/28/2012  . Leg pain 06/10/2012  . Normocytic anemia    pt denies this dx  . NSTEMI (non-ST elevated myocardial infarction) ([place]) 01/01/2017  . Obesity   . Onychomycosis 12/24/2015  . Peripheral nerve disease 09/02/2012   legs  . Peripheral vascular disease ([place]) 07/28/2012  . Pneumonia 2018  . Proteinuria   . PVD (peripheral vascular disease) ([place])    a. status post left SFA and popliteal stent and left SFA PTA with drug coated balloon in  08/2015.  . Status post transmetatarsal amputation of foot, left ([place]) 09/06/2015  . Tobacco abuse    quit cigarettes 2019, smokes marijuana  . Type 2 diabetes mellitus ([place]) 06/07/2012  . Type 2 diabetes mellitus with peripheral angiopathy ([place]) 09/02/2012    Family History:  Family History  Problem Relation Age of Onset  . Diabetes Mother   . Hypertension Father   . Colon cancer Neg Hx   . Stomach cancer Neg Hx   . Pancreatic cancer Neg Hx      Social History: Denies alcohol use, or smoking.  Reports using marijuana daily.  Currently living with a roommate in [place].  Currently on disability.  Review of Systems: A complete ROS was negative except as per HPI.   Physical Exam: Blood pressure (!) 134/106, pulse 91, temperature 98 F (36.7 C), temperature source Oral, resp. rate (!) 21, SpO2 99 %. Physical Exam Constitutional:      Comments: Chronically ill appearing female  HENT:     Head: Normocephalic and atraumatic.     Right Ear: Tympanic membrane normal.     Left Ear: Tympanic membrane normal.     Mouth/Throat:     Mouth: Mucous membranes are moist.     Pharynx: Oropharynx is clear. No oropharyngeal exudate.  Eyes:     Extraocular Movements: Extraocular movements intact.     Conjunctiva/sclera: Conjunctivae normal.     Pupils: Pupils are equal, round, and reactive to light.  Cardiovascular:     Rate and Rhythm: Normal rate and regular rhythm.     Pulses: Normal pulses.      Heart sounds: Normal heart sounds.  Pulmonary:     Effort: Pulmonary effort is normal. No respiratory distress.     Breath sounds: Normal breath sounds.  Abdominal:     General: Abdomen is flat. Bowel sounds are normal.     Palpations: Abdomen is soft.  Musculoskeletal:     Cervical back: Normal range of motion and neck supple.     Comments: Left upper extremity fistula with thrill  Skin:    General: Skin is warm and dry.     Capillary Refill: Capillary refill takes less than 2 seconds.     Comments: Right medial 1st metatarsal ulcer, with no clear drainage, no erythema or warmth noted, mildly TTP, no fluctuance noted  Neurological:     General: No focal deficit present.     Mental Status: She is alert and oriented to person, place, and time.  Psychiatric:        Mood and Affect: Mood normal.        Behavior: Behavior normal.          EKG: personally reviewed my interpretation is normal sinus rhythm, heart rate 89, normal axis, LVH  CXR: personally reviewed my interpretation is increased airspace opacity in right base, concerning for atelectasis versus early infiltrate  Assessment & Plan by Problem: Active Problems:   Right foot infection  Right 1st metatarsal skin infection: PAD: Presenting with a 5-day history of right great toe pain that continued to worsen, no obvious drainage, erythema, or warmth noted around the area.  Seen by orthopedics yesterday and was given a course of Bactrim and Percocet, pain continued to worsen and patient presented to the ED.  On evaluation she was noted to be afebrile, mildly tachypneic and hypertensive.  She has a small ulcerated lesion on the first metatarsal area of her right foot, with no fluctuance noted, she has pulses  present however does appear decreased. Her WBC was elevated at 20.  Lactic acid was 1.8.  Her x-ray of her right foot showed a soft tissue wound, no acute pulmonary abnormality.  Patient also noted to have a right base  opacity on chest x-ray, concerning for atelectasis versus early infiltrate.  She denied any worsening shortness of breath or productive cough, and lung exam is unremarkable, so less concerning for respiratory infection.  Given her significantly elevated white count it is concerning for systemic infection.  Patient is currently being treated with IV Zosyn. Patient has a history of PAD of her left extremity and required left BKA 2/2 non healing infections,  -Obtain right foot MRI -Obtain ABIs -Continue Zosyn for now -Dilaudid PRN -Tylenol PRN -F/u blood cultures -Daily CBC -Monitor fever curve  ESRD on HD MWF: Hypokalemia: Patient is on HD Monday Wednesday Friday, has been having issues with completing her dialysis sessions due to unclear reasons.  Nephrology is following and supplementing potassium.  Patient does not appear significantly volume overloaded at this time.  No urgent indications for dialysis.  -Nephrology following, appreciate recommendations -HD likely tomorrow per nephrology -Daily BMP  Anemia of chronic disease: Hemoglobin 10.1, near baseline.  Aranesp due on 3/26. -Daily CBC  Diabetes mellitus: Patient has sitagliptin 25 mg daily and glipizide 5 mg daily listed in her medication list.  Patient does not have these medications with the medications that she brought to the hospital. Unclear what medications she is taking at home.   -SSI-sensitive -Frequent CBGs  CAD: HTN: HFpEF: Patient denies any current angina symptoms.  EKG shows sinus rhythm, no acute ST changes.  Patient has aspirin 81 mg daily atorvastatin 80 mg daily, Plavix 75 mg daily, isosorbide mononitrate 60 mg daily, amlodipine 5 mg daily and hydralazine 50 mg daily.  These are the medications she has with her.  She also has a Lasix 180 mg twice daily however her discharge papers from her recent hospital admission had lasix ordered as 80 mg BID. Patient does not appear volume overloaded at this time.  Her  blood pressures have been around 150s/60s. Patient will need medication reconciliation to clarify her current medications.   -Resume home Lipitor -Resume home aspirin -Resume home Plavix 75 mg -Resume home Isosorbide mononitrate 60 daily -Hold home lasix until clarification regarding dosing  Dispo: Admit patient to Observation with expected length of stay less than 2 midnights.  Signed: Asencion Noble, MD 03/27/2020, 3:32 PM  Pager: [phone] After 5pm on weekdays and 1pm on weekends: On Call pager: [phone]

## 2020-03-27 NOTE — ED Notes (Signed)
Hospital provider at bedside

## 2020-03-27 NOTE — ED Triage Notes (Addendum)
Pt arrive from EMS EMS reports  Right big toe infection  and abscess on 3/18 and went to PCP yesterday and got it irrigated and got sent home on antibiotics and pain med. EMS states been having pain and vomiting today. Pt states she been taking the pain medication on an empty stomach and states she has not been getting sleep due to the toe pain    Diaysis pt mon,wed, fri

## 2020-03-27 NOTE — Progress Notes (Signed)
Pharmacy Antibiotic Note  Rebekah Johnson is a 60 y.o. female admitted on 03/27/2020 with infection of right toe.  Pharmacy has been consulted for zosyn dosing.  Hx ESRD-HD usually MWF  Plan: Zosyn 3.37g IV x 1, then 2.25g IV every 8 hours Monitor HD schedule, clinical progression and LOT     Temp (24hrs), Avg:98.6 F (37 C), Min:98 F (36.7 C), Max:98.9 F (37.2 C)  Recent Labs  Lab 03/27/20 0235  WBC 20.4*  CREATININE 4.07*    CrCl cannot be calculated (Unknown ideal weight.).    No Known Allergies  Bertis Ruddy, PharmD Clinical Pharmacist ED Pharmacist Phone # [phone] 03/27/2020 8:38 AM

## 2020-03-27 NOTE — ED Provider Notes (Signed)
[facility] EMERGENCY DEPARTMENT Provider Note   CSN: [id number] Arrival date & time: 03/27/20  0159     History Chief Complaint  Patient presents with  . Toe Pain  . Nausea  . Vomiting    Rebekah Johnson is a 60 y.o. female.  60 year old female with prior medical history as detailed below presents for evaluation.  Patient complains of painful infection to the medial aspect of the right great toe.  Patient's symptoms began approximate 1 week prior.  She reports that she is being seen in the outpatient setting for same complaint.  She reports that she is currently taking doxycycline.  Notes reviewed from her chart suggest that she may have been prescribed Bactrim.  Patient complains of increased pain and drainage from the medial aspect of the right great toe.  Of note, patient is a Monday, Wednesday, Friday dialysis patient.  Her last dialysis was on Monday.  She is also status post amputations on the left lower extremity -- reportedly secondary to infection and vascular insufficiency.   The history is provided by the patient and medical records.  Toe Pain This is a new problem. The current episode started more than 2 days ago. The problem occurs constantly. The problem has not changed since onset.Pertinent negatives include no chest pain and no abdominal pain. Nothing aggravates the symptoms. Nothing relieves the symptoms.       Past Medical History:  Diagnosis Date  . Acute congestive heart failure ([place])   . Acute diastolic CHF (congestive heart failure) ([place]) 09/14/2016  . Atrophic vaginitis 06/07/2012  . Avitaminosis D 07/28/2012  . CAD (coronary artery disease)    a. NSTEM 09/2016: cath showing severe diffuse disease of the RCA, ramus and Cx with mild-mod disease of LAD; PCI would require multiple stents and significant contrast usage thus medical therapy recommended.  . Cellulitis 07/12/2015  . Chest pain 01/01/2017  . Chronic diastolic CHF (congestive heart  failure) ([place])   . Chronic kidney disease 09/23/2016  . CKD (chronic kidney disease), stage IV ([place])   . Constipation   . COPD GOLD  0 08/31/2017   Quit smoking 07/17/17 - Spirometry 09/01/2017  FEV1 1.23 (58%)  Ratio 57 with mild curvature p no rx - 09/01/2017  After extensive coaching inhaler device,  effectiveness =    75% with elipta with cough provoked  - PFT's  10/14/2017  FEV1 1.69 (68 % ) ratio 84  p no % improvement from saba p nothing prior to study with DLCO  50 % corrects to 76  % for alv volume   - 10/14/2017  Walked RA x 3 laps @ 1  . Current tobacco use 06/10/2012  . Diabetes mellitus with neuropathy ([place])   . Diabetic foot ulcer ([place]) 07/13/2015  . Diabetic polyneuropathy associated with type 2 diabetes mellitus ([place]) 08/19/2017  . DOE (dyspnea on exertion) 09/01/2017   09/01/2017  Walked RA x 3 laps @ 185 ft each stopped due to  End of study, nl to mod fast  pace, no  desat   But stopped to rest x 2  Spirometry 09/01/2017  FEV1 1.23 (58%)  Ratio 57  > trial of anoro    . Dyspnea    with exertion  . ESRD (end stage renal disease) ([place])    MWF - [facility]  . Essential (primary) hypertension 06/10/2012  . Foot amputation status    a. h/o left foot transmetatarsal amputation  . Headache    Migraines  .  High cholesterol   . History of biliary T-tube placement 06/10/2012   Patient unaware   . History of cardiac arrest   . HLD (hyperlipidemia) 09/02/2012   Overview:  ICD-10 cut over    . Hypertension    a. patent renal arteries by PV angio 08/2015. b. heavy proteinuria 09/2016 ? nephrotic.  [name] Hypertensive heart disease with heart failure ([place])   . Hypertriglyceridemia 07/28/2012  . Leg pain 06/10/2012  . Normocytic anemia    pt denies this dx  . NSTEMI (non-ST elevated myocardial infarction) ([place]) 01/01/2017  . Obesity   . Onychomycosis 12/24/2015  . Peripheral nerve disease 09/02/2012   legs  . Peripheral vascular disease ([place]) 07/28/2012  . Pneumonia 2018  . Proteinuria   . PVD  (peripheral vascular disease) ([place])    a. status post left SFA and popliteal stent and left SFA PTA with drug coated balloon in 08/2015.  . Status post transmetatarsal amputation of foot, left ([place]) 09/06/2015  . Tobacco abuse    quit cigarettes 2019, smokes marijuana  . Type 2 diabetes mellitus ([place]) 06/07/2012  . Type 2 diabetes mellitus with peripheral angiopathy ([place]) 09/02/2012    Patient Active Problem List   Diagnosis Date Noted  . Left below-knee amputee ([place]) 10/24/2019  . Pyogenic inflammation of bone ([place])   . Non-pressure chronic ulcer of other part of left foot limited to breakdown of skin ([place])   . Osteomyelitis of foot, acute ([place]) 10/11/2019  . Cutaneous abscess of left foot 10/04/2019  . Allergy, unspecified, initial encounter 08/07/2019  . Anaphylactic shock, unspecified, initial encounter 08/07/2019  . Constipation 08/04/2019  . Nausea without vomiting 08/04/2019  . Mild protein-calorie malnutrition ([place]) 11/29/2018  . Anemia in chronic kidney disease 10/04/2018  . Coagulation defect, unspecified ([place]) 10/04/2018  . Iron deficiency anemia, unspecified 10/04/2018  . Secondary hyperparathyroidism of renal origin ([place]) 10/04/2018  . Multinodular goiter 08/31/2018  . History of influenza 01/08/2018  . Cardiac arrest ([place]) 11/22/2017  . DOE (dyspnea on exertion) 09/01/2017  . COPD GOLD  0 08/31/2017  . Diabetic polyneuropathy associated with type 2 diabetes mellitus ([place]) 08/19/2017  . ESRD (end stage renal disease) on dialysis ([place]) 03/31/2017  . Neovascular glaucoma of right eye, moderate stage 03/31/2017  . NSTEMI (non-ST elevated myocardial infarction) ([place]) 01/01/2017  . Chest pain 01/01/2017  . Tobacco abuse   . PVD (peripheral vascular disease) ([place])   . Proteinuria   . Obesity   . Normocytic anemia   . Hypertension   . High cholesterol   . Foot amputation status   . Diabetes mellitus with neuropathy ([place])   . CKD (chronic kidney disease), stage IV ([place])    . Chronic diastolic CHF (congestive heart failure) ([place])   . CAD (coronary artery disease) 10/27/2016  . Chronic kidney disease 09/23/2016  . Acute congestive heart failure ([place])   . Hypertensive heart disease with heart failure ([place])   . Chronic combined systolic and diastolic heart failure ([place]) 09/14/2016  . Onychomycosis 12/24/2015  . Status post transmetatarsal amputation of foot, left ([place]) 09/06/2015  . Diabetic foot ulcer ([place]) 07/13/2015  . Cellulitis 07/12/2015  . HLD (hyperlipidemia) 09/02/2012  . Peripheral nerve disease 09/02/2012  . Type 2 diabetes mellitus with peripheral angiopathy ([place]) 09/02/2012  . Hypertriglyceridemia 07/28/2012  . Peripheral vascular disease ([place]) 07/28/2012  . Avitaminosis D 07/28/2012  . Essential (primary) hypertension 06/10/2012  . History of biliary T-tube placement 06/10/2012  . Leg pain 06/10/2012  . Current tobacco  use 06/10/2012  . Atrophic vaginitis 06/07/2012  . Type 2 diabetes mellitus ([place]) 06/07/2012    Past Surgical History:  Procedure Laterality Date  . ABDOMINAL AORTOGRAM W/LOWER EXTREMITY Left 09/25/2019   Procedure: ABDOMINAL AORTOGRAM W/LOWER EXTREMITY;  Surgeon: Waynetta Sandy, MD;  Location: [facility];  Service: Cardiovascular;  Laterality: Left;  . ABDOMINAL HYSTERECTOMY    . AMPUTATION Left 09/06/2015   Procedure: LEFT TRANSMETATARSAL AMPUTATTION;  Surgeon: Newt Minion, MD;  Location: [facility];  Service: Orthopedics;  Laterality: Left;  . AMPUTATION Left 10/18/2019   Procedure: LEFT BELOW KNEE AMPUTATION;  Surgeon: Newt Minion, MD;  Location: [facility];  Service: Orthopedics;  Laterality: Left;  . AV FISTULA PLACEMENT Left 12/13/2018   Procedure: ARTERIOVENOUS (AV) FISTULA CREATION LEFT UPPER ARM;  Surgeon: Waynetta Sandy, MD;  Location: [facility];  Service: Vascular;  Laterality: Left;  . BASCILIC VEIN TRANSPOSITION Left 03/30/2019   Procedure: LEFT ARM ARTERIOVENOUS VEIN TRANSPOSITION;  Surgeon:  Serafina Mitchell, MD;  Location: [facility];  Service: Vascular;  Laterality: Left;  . COLONOSCOPY     polyps x 1  . LEFT HEART CATH AND CORONARY ANGIOGRAPHY N/A 09/17/2016   Procedure: LEFT HEART CATH AND CORONARY ANGIOGRAPHY;  Surgeon: Jettie Booze, MD;  Location: [facility];  Service: Cardiovascular;  Laterality: N/A;  . PERIPHERAL VASCULAR ATHERECTOMY Left 09/25/2019   Procedure: PERIPHERAL VASCULAR ATHERECTOMY;  Surgeon: Waynetta Sandy, MD;  Location: [facility];  Service: Cardiovascular;  Laterality: Left;  SFA  . PERIPHERAL VASCULAR CATHETERIZATION N/A 08/16/2015   Procedure: Abdominal Aortogram;  Surgeon: Elam Dutch, MD;  Location: [facility];  Service: Cardiovascular;  Laterality: N/A;  . PERIPHERAL VASCULAR CATHETERIZATION Bilateral 08/16/2015   Procedure: Lower Extremity Angiography;  Surgeon: Elam Dutch, MD;  Location: [facility];  Service: Cardiovascular;  Laterality: Bilateral;  . PERIPHERAL VASCULAR CATHETERIZATION Left 08/16/2015   Procedure: Peripheral Vascular Intervention;  Surgeon: Elam Dutch, MD;  Location: [facility];  Service: Cardiovascular;  Laterality: Left;  SFA STENT X 2  . PERIPHERAL VASCULAR INTERVENTION Left 09/25/2019   Procedure: PERIPHERAL VASCULAR INTERVENTION;  Surgeon: Waynetta Sandy, MD;  Location: [facility];  Service: Cardiovascular;  Laterality: Left;  SFA     OB History   No obstetric history on file.     Family History  Problem Relation Age of Onset  . Diabetes Mother   . Hypertension Father   . Colon cancer Neg Hx   . Stomach cancer Neg Hx   . Pancreatic cancer Neg Hx     Social History   Tobacco Use  . Smoking status: Former Smoker    Packs/day: 1.00    Years: 42.00    Pack years: 42.00    Types: Cigarettes    Quit date: 07/17/2017    Years since quitting: 2.6  . Smokeless tobacco: Never Used  Vaping Use  . Vaping Use: Never used  Substance Use  Topics  . Alcohol use: Not Currently    Alcohol/week: 1.0 - 2.0 standard drink    Types: 1 - 2 Glasses of wine per week    Comment: on social occassions  . Drug use: Yes    Frequency: 7.0 times per week    Types: Marijuana    Comment: daily use - last use 03/28/19    Home Medications Prior to Admission medications   Medication Sig Start Date End Date Taking? Authorizing  Provider  amLODipine (NORVASC) 5 MG tablet Take 1 tablet (5 mg total) by mouth daily. 08/22/19   Dorothy Spark, MD  Ascorbic Acid (VITAMIN C) 1000 MG tablet Take 1,000 mg by mouth daily.    [provider]  aspirin 81 MG EC tablet Take 1 tablet (81 mg total) by mouth daily. Reported on 07/13/2015 07/02/16   Charlott Rakes, MD  atorvastatin (LIPITOR) 80 MG tablet Take 1 tablet (80 mg total) by mouth daily. 10/07/17   Scot Jun, FNP  Blood Glucose Monitoring Suppl (ACCU-CHEK AVIVA) device Use as instructed three times daily. Patient taking differently: Use as instructed twice times daily. 04/22/16   Charlott Rakes, MD  carvedilol (COREG) 25 MG tablet Take 1 tablet (25 mg total) by mouth 2 (two) times daily. 10/07/17   Scot Jun, FNP  cholecalciferol (VITAMIN D3) 25 MCG (1000 UT) tablet Take 1,000 Units by mouth daily.    [provider]  clopidogrel (PLAVIX) 75 MG tablet Take 1 tablet (75 mg total) by mouth daily. 10/07/17   Scot Jun, FNP  docusate sodium (COLACE) 100 MG capsule Take 200 mg by mouth 2 (two) times daily.     [provider]  doxycycline (VIBRA-TABS) 100 MG tablet Take 1 tablet (100 mg total) by mouth 2 (two) times daily. 03/26/20   Suzan Slick, NP  furosemide (LASIX) 80 MG tablet Take 80 mg by mouth 2 (two) times daily. 03/21/18   [provider]  gabapentin (NEURONTIN) 300 MG capsule Take 1 capsule (300 mg total) by mouth 3 (three) times daily. 10/07/17   Scot Jun, FNP  glipiZIDE (GLUCOTROL) 5 MG tablet Take 0.5 tablets (2.5 mg total) by  mouth daily before breakfast. Patient taking differently: Take 5 mg by mouth daily before breakfast. 11/01/18   Renato Shin, MD  glucose blood (ACCU-CHEK AVIVA) test strip Use as instructed three times daily before meals. 10/14/17   Charlott Rakes, MD  hydrALAZINE (APRESOLINE) 50 MG tablet Take 50 mg by mouth 3 (three) times daily.    [provider]  isosorbide mononitrate (IMDUR) 30 MG 24 hr tablet Take 60 mg by mouth daily. 03/22/18   [provider]  lactulose (CHRONULAC) 10 GM/15ML solution Take 15 mLs (10 g total) by mouth 3 (three) times daily. 03/21/19   Lawyer, Harrell Gave, PA-C  [employer] [facility]) lancets Use as instructed three times daily before meals. 04/22/16   Charlott Rakes, MD  latanoprost (XALATAN) 0.005 % ophthalmic solution Place 1 drop into both eyes at bedtime.     [provider]  linaclotide Rolan Lipa) 145 MCG CAPS capsule Take 1 capsule (145 mcg total) by mouth daily before breakfast. Take ~ 30 minutes before breakfast, on an empty stomach 10/09/19   Sponseller, Rebekah R, PA-C  nitroGLYCERIN (NITRODUR - DOSED IN MG/24 HR) 0.2 mg/hr patch Place 1 patch (0.2 mg total) onto the skin daily. 08/15/19   Newt Minion, MD  nitroGLYCERIN (NITROSTAT) 0.4 MG SL tablet Place 1 tablet (0.4 mg total) under the tongue every 5 (five) minutes as needed for chest pain. 01/18/17 10/12/19  Dorothy Spark, MD  Omega-3 Fatty Acids (FISH OIL) 1000 MG CAPS Take 1,000 mg by mouth daily.     [provider]  oxyCODONE-acetaminophen (PERCOCET/ROXICET) 5-325 MG tablet Take 1 tablet by mouth every 8 (eight) hours as needed for severe pain. 03/26/20   Suzan Slick, NP  ranolazine (RANEXA) 500 MG 12 hr tablet Take 1 tablet by  mouth twice daily 07/04/19   Dorothy Spark, MD  sevelamer carbonate (RENVELA) 800 MG tablet Take 800 mg by mouth See admin instructions. Three times daily with meals and with snack.    [provider]  silver  sulfADIAZINE (SILVADENE) 1 % cream Apply 1 application topically daily. Apply to affected area daily plus dry dressing Patient taking differently: Apply 1 application topically daily as needed (wound care). 07/11/18   Newt Minion, MD  sitaGLIPtin (JANUVIA) 25 MG tablet Take 1 tablet (25 mg total) by mouth daily. 10/07/17   Scot Jun, FNP  ZOFRAN 4 MG tablet Take 4 mg by mouth every 8 (eight) hours as needed for nausea/vomiting. 03/08/19   [provider]    Allergies    Patient has no known allergies.  Review of Systems   Review of Systems  Cardiovascular: Negative for chest pain.  Gastrointestinal: Negative for abdominal pain.  All other systems reviewed and are negative.   Physical Exam Updated Vital Signs BP (!) 165/72 (BP Location: Right Arm)   Pulse 92   Temp 98 F (36.7 C) (Oral)   Resp 16   LMP  (LMP Unknown)   SpO2 90%   Physical Exam Vitals and nursing note reviewed.  Constitutional:      General: She is not in acute distress.    Appearance: She is well-developed.  HENT:     Head: Normocephalic and atraumatic.  Eyes:     Conjunctiva/sclera: Conjunctivae normal.     Pupils: Pupils are equal, round, and reactive to light.  Cardiovascular:     Rate and Rhythm: Normal rate and regular rhythm.     Heart sounds: Normal heart sounds.  Pulmonary:     Effort: Pulmonary effort is normal. No respiratory distress.     Breath sounds: Normal breath sounds.  Abdominal:     General: There is no distension.     Palpations: Abdomen is soft.     Tenderness: There is no abdominal tenderness.  Musculoskeletal:        General: No deformity. Normal range of motion.     Cervical back: Normal range of motion and neck supple.     Comments: AVF in LUE with palpable thrill without overlying erythema or edema.    Medial aspect of right great toe is very tender.   See attached photo below.   Skin:    General: Skin is warm and dry.  Neurological:     Mental Status:  She is alert and oriented to person, place, and time.         ED Results / Procedures / Treatments   Labs (all labs ordered are listed, but only abnormal results are displayed) Labs Reviewed  COMPREHENSIVE METABOLIC PANEL - Abnormal; Notable for the following components:      Result Value   Potassium 2.9 (*)    Chloride 96 (*)    Glucose, Bld 161 (*)    BUN 46 (*)    Creatinine, Ser 4.07 (*)    Albumin 3.1 (*)    GFR, Estimated 12 (*)    All other components within normal limits  CBC - Abnormal; Notable for the following components:   WBC 20.4 (*)    RBC 3.53 (*)    Hemoglobin 10.1 (*)    HCT 32.3 (*)    RDW 16.6 (*)    All other components within normal limits  CULTURE, BLOOD (ROUTINE X 2)  CULTURE, BLOOD (ROUTINE X 2)  RESP PANEL  BY RT-PCR (FLU A&B, COVID) ARPGX2  LACTIC ACID, PLASMA  LACTIC ACID, PLASMA    EKG EKG Interpretation  Date/Time:  Wednesday March 27 2020 09:47:03 EDT Ventricular Rate:  89 PR Interval:    QRS Duration: 100 QT Interval:  410 QTC Calculation: 499 R Axis:   45 Text Interpretation: Sinus rhythm Left atrial enlargement LVH with secondary repolarization abnormality Inferior infarct, age indeterminate Confirmed by Dene Gentry [phone]) on 03/27/2020 9:52:56 AM   Radiology DG Chest 1 View  Result Date: 03/27/2020 CLINICAL DATA:  Shortness of breath EXAM: CHEST  1 VIEW COMPARISON:  10/14/2017 FINDINGS: Cardiac shadow is stable. Aortic calcifications are seen. The lungs are well aerated bilaterally. Increased airspace opacity is noted in the right base consistent with atelectasis/early infiltrate. No sizable effusion is noted. No acute bony abnormality is seen. IMPRESSION: Increased airspace opacity in the right base likely representing atelectasis/early infiltrate. Electronically Signed   By: Inez Catalina M.D.   On: 03/27/2020 09:03   DG Foot Complete Right  Result Date: 03/27/2020 CLINICAL DATA:  First toe pain, initial encounter EXAM:  RIGHT FOOT COMPLETE - 3+ VIEW COMPARISON:  03/26/2020 FINDINGS: No acute fracture or dislocation is noted. Soft tissue wound is noted along the medial aspect of the first toe consistent with the given history. This is slightly more prominent than that seen on prior exam consistent with the recent debridement. No bony erosive changes to suggest osteomyelitis are noted. IMPRESSION: Soft tissue wound consistent with the given clinical history. No acute bony abnormality is noted. Electronically Signed   By: Inez Catalina M.D.   On: 03/27/2020 09:01   XR Toe Great Right  Result Date: 03/27/2020 Radiographs of the right great toe show some bony destruction of the distal phalanx.  No acute finding   Procedures Procedures   Medications Ordered in ED Medications  piperacillin-tazobactam (ZOSYN) IVPB 2.25 g (has no administration in time range)  piperacillin-tazobactam (ZOSYN) IVPB 3.375 g (has no administration in time range)  oxyCODONE-acetaminophen (PERCOCET/ROXICET) 5-325 MG per tablet 1 tablet (1 tablet Oral Given 03/27/20 7616)    ED Course  I have reviewed the triage vital signs and the nursing notes.  Pertinent labs & imaging results that were available during my care of the patient were reviewed by me and considered in my medical decision making (see chart for details).    MDM Rules/Calculators/A&P                          MDM  Screen complete  Rebekah Johnson was evaluated in Emergency Department on 03/27/2020 for the symptoms described in the history of present illness. She was evaluated in the context of the global COVID-19 pandemic, which necessitated consideration that the patient might be at risk for infection with the SARS-CoV-2 virus that causes COVID-19. Institutional protocols and algorithms that pertain to the evaluation of patients at risk for COVID-19 are in a state of rapid change based on information released by regulatory bodies including the CDC and federal and state  organizations. These policies and algorithms were followed during the patient's care in the ED.   Patient presenting for evaluation of right great toe/foot infection.  Patient with increased WBC and increased pain.   Zosyn initiated in ED. Plain films without clear evidence of osteo.  Nephrology aware of plan to admit.   Internal medicine teaching service aware of case and will evaluate for admission.    Final Clinical Impression(s) / ED  Diagnoses Final diagnoses:  ESRD (end stage renal disease) ([place])  Foot infection    Rx / DC Orders ED Discharge Orders    None       Valarie Merino, MD 03/27/20 1210

## 2020-03-27 NOTE — Consult Note (Addendum)
[facility] Renal Consultation Note  Indication for Consultation:  Management of HD MWF/hemodialysis; anemia, hypertension/volume and secondary hyperparathyroidism  HPI: Rebekah Johnson is a 60 y.o. female with ESRD on HD MWF [place], DM type II PVD status post left BKA, CAD NSTEMI 09/2016, tobacco use, COPD, MGUS, and compliance issues with HD treatment. Now admitted with right great toe ulcer pain.  Noted seen by orthopedic NP yesterday given prescription for Bactrim and Percocet but continued to have significant pain through the night came to the ER.  Lab in the ER= K2.9, WBC 20.4, Hgb 10.1, BP 170/89 initially, O2 sat 96% room air CXR= right base atelectasis/early infiltrate Plain film right foot= soft tissue wound, no acute bony abnormality MRI right foot pending  Seen in ER complains of some foot pain, not in distress denies any nausea vomiting shortness of breath chest pain /dizziness.   Noted she shortened  her last 2 HD treatments 1 hour 20 minutes for the 29 minutes, and in fact multiple short treatments before that.  She attributes to not being able to sit in HD entire time, no specific reason given.  Discussion about continuing dialysis or stopping and she would like to continue// will try to be more compliant     Past Medical History:  Diagnosis Date  . Acute congestive heart failure ([place])   . Acute diastolic CHF (congestive heart failure) ([place]) 09/14/2016  . Atrophic vaginitis 06/07/2012  . Avitaminosis D 07/28/2012  . CAD (coronary artery disease)    a. NSTEM 09/2016: cath showing severe diffuse disease of the RCA, ramus and Cx with mild-mod disease of LAD; PCI would require multiple stents and significant contrast usage thus medical therapy recommended.  . Cellulitis 07/12/2015  . Chest pain 01/01/2017  . Chronic diastolic CHF (congestive heart failure) ([place])   . Chronic kidney disease 09/23/2016  . CKD (chronic kidney disease), stage IV ([place])   .  Constipation   . COPD GOLD  0 08/31/2017   Quit smoking 07/17/17 - Spirometry 09/01/2017  FEV1 1.23 (58%)  Ratio 57 with mild curvature p no rx - 09/01/2017  After extensive coaching inhaler device,  effectiveness =    75% with elipta with cough provoked  - PFT's  10/14/2017  FEV1 1.69 (68 % ) ratio 84  p no % improvement from saba p nothing prior to study with DLCO  50 % corrects to 76  % for alv volume   - 10/14/2017  Walked RA x 3 laps @ 1  . Current tobacco use 06/10/2012  . Diabetes mellitus with neuropathy ([place])   . Diabetic foot ulcer ([place]) 07/13/2015  . Diabetic polyneuropathy associated with type 2 diabetes mellitus ([place]) 08/19/2017  . DOE (dyspnea on exertion) 09/01/2017   09/01/2017  Walked RA x 3 laps @ 185 ft each stopped due to  End of study, nl to mod fast  pace, no  desat   But stopped to rest x 2  Spirometry 09/01/2017  FEV1 1.23 (58%)  Ratio 57  > trial of anoro    . Dyspnea    with exertion  . ESRD (end stage renal disease) ([place])    MWF - [facility]  . Essential (primary) hypertension 06/10/2012  . Foot amputation status    a. h/o left foot transmetatarsal amputation  . Headache    Migraines  . High cholesterol   . History of biliary T-tube placement 06/10/2012   Patient unaware   . History of cardiac  arrest   . HLD (hyperlipidemia) 09/02/2012   Overview:  ICD-10 cut over    . Hypertension    a. patent renal arteries by PV angio 08/2015. b. heavy proteinuria 09/2016 ? nephrotic.  [name] Hypertensive heart disease with heart failure ([place])   . Hypertriglyceridemia 07/28/2012  . Leg pain 06/10/2012  . Normocytic anemia    pt denies this dx  . NSTEMI (non-ST elevated myocardial infarction) ([place]) 01/01/2017  . Obesity   . Onychomycosis 12/24/2015  . Peripheral nerve disease 09/02/2012   legs  . Peripheral vascular disease ([place]) 07/28/2012  . Pneumonia 2018  . Proteinuria   . PVD (peripheral vascular disease) ([place])    a. status post left SFA and popliteal stent and left SFA PTA with drug  coated balloon in 08/2015.  . Status post transmetatarsal amputation of foot, left ([place]) 09/06/2015  . Tobacco abuse    quit cigarettes 2019, smokes marijuana  . Type 2 diabetes mellitus ([place]) 06/07/2012  . Type 2 diabetes mellitus with peripheral angiopathy ([place]) 09/02/2012    Past Surgical History:  Procedure Laterality Date  . ABDOMINAL AORTOGRAM W/LOWER EXTREMITY Left 09/25/2019   Procedure: ABDOMINAL AORTOGRAM W/LOWER EXTREMITY;  Surgeon: Waynetta Sandy, MD;  Location: [facility];  Service: Cardiovascular;  Laterality: Left;  . ABDOMINAL HYSTERECTOMY    . AMPUTATION Left 09/06/2015   Procedure: LEFT TRANSMETATARSAL AMPUTATTION;  Surgeon: Newt Minion, MD;  Location: [facility];  Service: Orthopedics;  Laterality: Left;  . AMPUTATION Left 10/18/2019   Procedure: LEFT BELOW KNEE AMPUTATION;  Surgeon: Newt Minion, MD;  Location: [facility];  Service: Orthopedics;  Laterality: Left;  . AV FISTULA PLACEMENT Left 12/13/2018   Procedure: ARTERIOVENOUS (AV) FISTULA CREATION LEFT UPPER ARM;  Surgeon: Waynetta Sandy, MD;  Location: [facility];  Service: Vascular;  Laterality: Left;  . BASCILIC VEIN TRANSPOSITION Left 03/30/2019   Procedure: LEFT ARM ARTERIOVENOUS VEIN TRANSPOSITION;  Surgeon: Serafina Mitchell, MD;  Location: [facility];  Service: Vascular;  Laterality: Left;  . COLONOSCOPY     polyps x 1  . LEFT HEART CATH AND CORONARY ANGIOGRAPHY N/A 09/17/2016   Procedure: LEFT HEART CATH AND CORONARY ANGIOGRAPHY;  Surgeon: Jettie Booze, MD;  Location: [facility];  Service: Cardiovascular;  Laterality: N/A;  . PERIPHERAL VASCULAR ATHERECTOMY Left 09/25/2019   Procedure: PERIPHERAL VASCULAR ATHERECTOMY;  Surgeon: Waynetta Sandy, MD;  Location: [facility];  Service: Cardiovascular;  Laterality: Left;  SFA  . PERIPHERAL VASCULAR CATHETERIZATION N/A 08/16/2015   Procedure: Abdominal Aortogram;  Surgeon: Elam Dutch, MD;  Location: [facility];   Service: Cardiovascular;  Laterality: N/A;  . PERIPHERAL VASCULAR CATHETERIZATION Bilateral 08/16/2015   Procedure: Lower Extremity Angiography;  Surgeon: Elam Dutch, MD;  Location: [facility];  Service: Cardiovascular;  Laterality: Bilateral;  . PERIPHERAL VASCULAR CATHETERIZATION Left 08/16/2015   Procedure: Peripheral Vascular Intervention;  Surgeon: Elam Dutch, MD;  Location: [facility];  Service: Cardiovascular;  Laterality: Left;  SFA STENT X 2  . PERIPHERAL VASCULAR INTERVENTION Left 09/25/2019   Procedure: PERIPHERAL VASCULAR INTERVENTION;  Surgeon: Waynetta Sandy, MD;  Location: [facility];  Service: Cardiovascular;  Laterality: Left;  SFA      Family History  Problem Relation Age of Onset  . Diabetes Mother   . Hypertension Father   . Colon cancer Neg Hx   . Stomach cancer Neg Hx   . Pancreatic cancer Neg Hx  reports that she quit smoking about 2 years ago. Her smoking use included cigarettes. She has a 42.00 pack-year smoking history. She has never used smokeless tobacco. She reports previous alcohol use of about 1.0 - 2.0 standard drink of alcohol per week. She reports current drug use. Frequency: 7.00 times per week. Drug: Marijuana.  No Known Allergies  Prior to Admission medications   Medication Sig Start Date End Date Taking? Authorizing Provider  amLODipine (NORVASC) 5 MG tablet Take 1 tablet (5 mg total) by mouth daily. 08/22/19  Yes Dorothy Spark, MD  Ascorbic Acid (VITAMIN C) 1000 MG tablet Take 1,000 mg by mouth daily.   Yes [provider]  aspirin 81 MG EC tablet Take 1 tablet (81 mg total) by mouth daily. Reported on 07/13/2015 07/02/16  Yes Charlott Rakes, MD  atorvastatin (LIPITOR) 80 MG tablet Take 1 tablet (80 mg total) by mouth daily. 10/07/17  Yes Scot Jun, FNP  atropine 1 % ophthalmic solution Place 1 drop into the left eye daily.   Yes [provider]  brimonidine (ALPHAGAN) 0.2  % ophthalmic solution Place 1 drop into both eyes every 8 (eight) hours. 02/29/20  Yes [provider]  cholecalciferol (VITAMIN D3) 25 MCG (1000 UT) tablet Take 1,000 Units by mouth daily.   Yes [provider]  clopidogrel (PLAVIX) 75 MG tablet Take 1 tablet (75 mg total) by mouth daily. 10/07/17  Yes Scot Jun, FNP  dorzolamide-timolol (COSOPT) 22.3-6.8 MG/ML ophthalmic solution Place 1 drop into both eyes 2 (two) times daily. 02/29/20  Yes [provider]  doxycycline (VIBRA-TABS) 100 MG tablet Take 1 tablet (100 mg total) by mouth 2 (two) times daily. 03/26/20  Yes Dondra Prader R, NP  latanoprost (XALATAN) 0.005 % ophthalmic solution Place 1 drop into both eyes at bedtime.    Yes [provider]  lidocaine-prilocaine (EMLA) cream Apply 1 application topically 3 (three) times a week. 02/05/20  Yes [provider]  prednisoLONE acetate (PRED FORTE) 1 % ophthalmic suspension Place 1 drop into the left eye 4 (four) times daily.   Yes [provider]  sucroferric oxyhydroxide (VELPHORO) 500 MG chewable tablet Chew 1,000 mg by mouth as directed. Take 2 tablets (1000 mg) TID and Take 2 tablets (1000 mg) with snack   Yes [provider]  Blood Glucose Monitoring Suppl (ACCU-CHEK AVIVA) device Use as instructed three times daily. Patient taking differently: Use as instructed twice times daily. 04/22/16   Charlott Rakes, MD  carvedilol (COREG) 25 MG tablet Take 1 tablet (25 mg total) by mouth 2 (two) times daily. Patient not taking: No sig reported 10/07/17   Scot Jun, FNP  furosemide (LASIX) 80 MG tablet Take 80 mg by mouth 2 (two) times daily. Patient not taking: No sig reported 03/21/18   [provider]  gabapentin (NEURONTIN) 300 MG capsule Take 1 capsule (300 mg total) by mouth 3 (three) times daily. Patient not taking: No sig reported 10/07/17   Scot Jun, FNP  glipiZIDE (GLUCOTROL) 5 MG tablet Take 0.5  tablets (2.5 mg total) by mouth daily before breakfast. Patient not taking: No sig reported 11/01/18   Renato Shin, MD  glucose blood (ACCU-CHEK AVIVA) test strip Use as instructed three times daily before meals. 10/14/17   Charlott Rakes, MD  lactulose (CHRONULAC) 10 GM/15ML solution Take 15 mLs (10 g total) by mouth 3 (three) times daily. Patient not taking: No sig reported 03/21/19   Dalia Heading, PA-C  Lancet Devices (ACCU-CHEK SOFTCLIX) lancets Use as instructed three times daily before meals. 04/22/16   Charlott Rakes, MD  linaclotide (LINZESS) 145 MCG CAPS capsule Take 1 capsule (145 mcg total) by mouth daily before breakfast. Take ~ 30 minutes before breakfast, on an empty stomach Patient not taking: No sig reported 10/09/19   Sponseller, Rebekah R, PA-C  nitroGLYCERIN (NITRODUR - DOSED IN MG/24 HR) 0.2 mg/hr patch Place 1 patch (0.2 mg total) onto the skin daily. Patient not taking: No sig reported 08/15/19   Newt Minion, MD  nitroGLYCERIN (NITROSTAT) 0.4 MG SL tablet Place 1 tablet (0.4 mg total) under the tongue every 5 (five) minutes as needed for chest pain. 01/18/17 10/12/19  Dorothy Spark, MD  Omega-3 Fatty Acids (FISH OIL) 1000 MG CAPS Take 1,000 mg by mouth daily.     [provider]  oxyCODONE-acetaminophen (PERCOCET/ROXICET) 5-325 MG tablet Take 1 tablet by mouth every 8 (eight) hours as needed for severe pain. 03/26/20   Suzan Slick, NP  ranolazine (RANEXA) 500 MG 12 hr tablet Take 1 tablet by mouth twice daily Patient not taking: No sig reported 07/04/19   Dorothy Spark, MD  silver sulfADIAZINE (SILVADENE) 1 % cream Apply 1 application topically daily. Apply to affected area daily plus dry dressing Patient not taking: No sig reported 07/11/18   Newt Minion, MD  sitaGLIPtin (JANUVIA) 25 MG tablet Take 1 tablet (25 mg total) by mouth daily. Patient not taking: No sig reported 10/07/17   Scot Jun, FNP     Anti-infectives (From admission,  onward)   Start     Dose/Rate Route Frequency Ordered Stop   03/27/20 1400  piperacillin-tazobactam (ZOSYN) IVPB 2.25 g        2.25 g 100 mL/hr over 30 Minutes Intravenous Every 8 hours 03/27/20 0841     03/27/20 0845  piperacillin-tazobactam (ZOSYN) IVPB 3.375 g        3.375 g 100 mL/hr over 30 Minutes Intravenous  Once 03/27/20 0841 03/27/20 1032      Results for orders placed or performed during the hospital encounter of 03/27/20 (from the past 48 hour(s))  Comprehensive metabolic panel     Status: Abnormal   Collection Time: 03/27/20  2:35 AM  Result Value Ref Range   Sodium 135 135 - 145 mmol/L   Potassium 2.9 (L) 3.5 - 5.1 mmol/L   Chloride 96 (L) 98 - 111 mmol/L   CO2 26 22 - 32 mmol/L   Glucose, Bld 161 (H) 70 - 99 mg/dL    Comment: Glucose reference range applies only to samples taken after fasting for at least 8 hours.   BUN 46 (H) 8 - 23 mg/dL   Creatinine, Ser 4.07 (H) 0.44 - 1.00 mg/dL   Calcium 9.1 8.9 - 10.3 mg/dL   Total Protein 6.8 6.5 - 8.1 g/dL   Albumin 3.1 (L) 3.5 - 5.0 g/dL   AST 20 15 - 41 U/L   ALT 26 0 - 44 U/L   Alkaline Phosphatase 81 38 - 126 U/L   Total Bilirubin 0.8 0.3 - 1.2 mg/dL   GFR, Estimated 12 (L) >60 mL/min    Comment: (NOTE) Calculated using the CKD-EPI Creatinine Equation (2021)    Anion gap 13 5 - 15    Comment: Performed at [facility] [address], [place], [place] [address]  CBC     Status: Abnormal   Collection Time: 03/27/20  2:35 AM  Result Value  Ref Range   WBC 20.4 (H) 4.0 - 10.5 K/uL   RBC 3.53 (L) 3.87 - 5.11 MIL/uL   Hemoglobin 10.1 (L) 12.0 - 15.0 g/dL   HCT 32.3 (L) 36.0 - 46.0 %   MCV 91.5 80.0 - 100.0 fL   MCH 28.6 26.0 - 34.0 pg   MCHC 31.3 30.0 - 36.0 g/dL   RDW 16.6 (H) 11.5 - 15.5 %   Platelets 269 150 - 400 K/uL   nRBC 0.0 0.0 - 0.2 %    Comment: Performed at [facility] [address] [place], [place] [address]  Resp Panel by RT-PCR (Flu A&B, Covid) Nasopharyngeal Swab     Status: None    Collection Time: 03/27/20  8:30 AM   Specimen: Nasopharyngeal Swab; Nasopharyngeal(NP) swabs in vial transport medium  Result Value Ref Range   SARS Coronavirus 2 by RT PCR NEGATIVE NEGATIVE    Comment: (NOTE) SARS-CoV-2 target nucleic acids are NOT DETECTED.  The SARS-CoV-2 RNA is generally detectable in upper respiratory specimens during the acute phase of infection. The lowest concentration of SARS-CoV-2 viral copies this assay can detect is 138 copies/mL. A negative result does not preclude SARS-Cov-2 infection and should not be used as the sole basis for treatment or other patient management decisions. A negative result may occur with  improper specimen collection/handling, submission of specimen other than nasopharyngeal swab, presence of viral mutation(s) within the areas targeted by this assay, and inadequate number of viral copies(<138 copies/mL). A negative result must be combined with clinical observations, patient history, and epidemiological information. The expected result is Negative.  Fact Sheet for Patients:  EntrepreneurPulse.com.au  Fact Sheet for Healthcare Providers:  IncredibleEmployment.be  This test is no t yet approved or cleared by the [place] FDA and  has been authorized for detection and/or diagnosis of SARS-CoV-2 by FDA under an Emergency Use Authorization (EUA). This EUA will remain  in effect (meaning this test can be used) for the duration of the COVID-19 declaration under Section 564(b)(1) of the Act, 21 U.S.C.section 360bbb-3(b)(1), unless the authorization is terminated  or revoked sooner.       Influenza A by PCR NEGATIVE NEGATIVE   Influenza B by PCR NEGATIVE NEGATIVE    Comment: (NOTE) The Xpert Xpress SARS-CoV-2/FLU/RSV plus assay is intended as an aid in the diagnosis of influenza from Nasopharyngeal swab specimens and should not be used as a sole basis for treatment. Nasal washings  and aspirates are unacceptable for Xpert Xpress SARS-CoV-2/FLU/RSV testing.  Fact Sheet for Patients: EntrepreneurPulse.com.au  Fact Sheet for Healthcare Providers: IncredibleEmployment.be  This test is not yet approved or cleared by the [place] FDA and has been authorized for detection and/or diagnosis of SARS-CoV-2 by FDA under an Emergency Use Authorization (EUA). This EUA will remain in effect (meaning this test can be used) for the duration of the COVID-19 declaration under Section 564(b)(1) of the Act, 21 U.S.C. section 360bbb-3(b)(1), unless the authorization is terminated or revoked.  Performed at [facility], [place] [address], [place], [place] [address]   Lactic acid, plasma     Status: None   Collection Time: 03/27/20 10:30 AM  Result Value Ref Range   Lactic Acid, Venous 1.8 0.5 - 1.9 mmol/L    Comment: Performed at [facility] [address], [place], [place] [address]    ROS: See HPI   Physical Exam: Vitals:   03/27/20 1330 03/27/20 1345  BP: (!) 168/85 (!) 134/106  Pulse: 89 91  Resp: (!) 21 (!) 21  Temp:  98 F (36.7 C)  SpO2: (!) 86% 99%     General: An adult female, thin chronically ill-appearing no acute distress HEENT: [place], PERRLA,, EOMI, sclera nonicteric Neck: Supple no JVD Heart: RRR no MRG Lungs: CTA except decreased in bases bilaterally, nonlabored breathing Abdomen: Bowel sounds normoactive, soft NT ND, no ascites Extremities: Trace pedal edema on right, and dorsum of right foot trace left BKA stump no edema Skin: Right great toe tip ulcer and dry ulcer at MTP joint medially.  No discharge and Neuro: Alert O x3, moves all extremities independently, no acute focal deficits appreciated Dialysis Access: Positive bruit  LUA  AV F  Dialysis Orders: Center: [facility] on MWF. EDW 64 kg HD Bath 3K, 2.25 CA time 3 or 45 minutes Heparin none. Access LUA AVF     Heck 3 mcg IV/HD  Mircera 60 every  2 weeks last given ulcer 03/16/2020 Venofer 100 mg x 5 to start next treatment     Assessment/Plan 1. PVD /right great toe ulcer with pain= plan per admit same MRI pending antibiotics per admit 2. ESRD -HD MWF schedule , due to emergent HD patient numbers HD tomorrow, K and volume okay 3. Hypokalemia= K2.9 give p.o. supplement use added K bath tomorrow 4. Hypertension/volume  -mild hypertension secondary to pain versus med compliance, volume okay 5. Anemia of ESRD-Hgb 10.1 continue ESA , Aranesp would be due next on 3/26, hold IV iron with infection 6. Metabolic bone disease -corrected calcium 9.8 follow-up calcium phosphorus trend continue phosphate binders and vitamin D on HD also Sensipar on dialysis 7. Diabetes mellitus type 2= per admit 8. CAD= meds per admit no current chest pain or shortness of breath 9. Nutrition -ALB 3.1 ,diet renal carb modified renal vitamin, Nepro supplement  Ernest Haber, PA-C [place] [facility] [phone] 03/27/2020, 1:59 PM

## 2020-03-27 NOTE — Hospital Course (Addendum)
OUTPAT F/U: - will need lower doses of meds (esp DM given 4.7)  ? May need to reach back out to ID given point tenderness, concern for Osteo? - needs anemia follow up / likely resumption of iron after infection resolves  - afternoon CBC)

## 2020-03-27 NOTE — Plan of Care (Signed)
  Problem: Education: Goal: Knowledge of General Education information will improve Description Including pain rating scale, medication(s)/side effects and non-pharmacologic comfort measures Outcome: Progressing   

## 2020-03-27 NOTE — ED Notes (Signed)
Patient transported to X-ray 

## 2020-03-27 NOTE — ED Notes (Addendum)
Patient requesting food, ok to eat per ED provider, sandwich and drink given. SON phone [phone], wants to be called before patient is discharged.

## 2020-03-27 NOTE — ED Notes (Signed)
Spoke with MRI tech, they will pick up patient from ED and then transport to [facility] after testing. Transport has been ordered.

## 2020-03-28 ENCOUNTER — Encounter: Payer: Medicare Other | Admitting: Physical Therapy

## 2020-03-28 DIAGNOSIS — E1151 Type 2 diabetes mellitus with diabetic peripheral angiopathy without gangrene: Secondary | ICD-10-CM | POA: Diagnosis present

## 2020-03-28 DIAGNOSIS — I132 Hypertensive heart and chronic kidney disease with heart failure and with stage 5 chronic kidney disease, or end stage renal disease: Secondary | ICD-10-CM | POA: Diagnosis present

## 2020-03-28 DIAGNOSIS — Z20822 Contact with and (suspected) exposure to covid-19: Secondary | ICD-10-CM | POA: Diagnosis present

## 2020-03-28 DIAGNOSIS — Z8249 Family history of ischemic heart disease and other diseases of the circulatory system: Secondary | ICD-10-CM | POA: Diagnosis not present

## 2020-03-28 DIAGNOSIS — I2510 Atherosclerotic heart disease of native coronary artery without angina pectoris: Secondary | ICD-10-CM | POA: Diagnosis present

## 2020-03-28 DIAGNOSIS — Z7902 Long term (current) use of antithrombotics/antiplatelets: Secondary | ICD-10-CM | POA: Diagnosis not present

## 2020-03-28 DIAGNOSIS — E785 Hyperlipidemia, unspecified: Secondary | ICD-10-CM | POA: Diagnosis present

## 2020-03-28 DIAGNOSIS — Z90710 Acquired absence of both cervix and uterus: Secondary | ICD-10-CM | POA: Diagnosis not present

## 2020-03-28 DIAGNOSIS — R52 Pain, unspecified: Secondary | ICD-10-CM | POA: Diagnosis present

## 2020-03-28 DIAGNOSIS — L089 Local infection of the skin and subcutaneous tissue, unspecified: Secondary | ICD-10-CM | POA: Diagnosis not present

## 2020-03-28 DIAGNOSIS — L03115 Cellulitis of right lower limb: Secondary | ICD-10-CM | POA: Diagnosis present

## 2020-03-28 DIAGNOSIS — Z87891 Personal history of nicotine dependence: Secondary | ICD-10-CM | POA: Diagnosis not present

## 2020-03-28 DIAGNOSIS — E1169 Type 2 diabetes mellitus with other specified complication: Secondary | ICD-10-CM | POA: Diagnosis present

## 2020-03-28 DIAGNOSIS — Z7984 Long term (current) use of oral hypoglycemic drugs: Secondary | ICD-10-CM

## 2020-03-28 DIAGNOSIS — N186 End stage renal disease: Secondary | ICD-10-CM | POA: Diagnosis present

## 2020-03-28 DIAGNOSIS — E11628 Type 2 diabetes mellitus with other skin complications: Secondary | ICD-10-CM | POA: Diagnosis present

## 2020-03-28 DIAGNOSIS — E11621 Type 2 diabetes mellitus with foot ulcer: Secondary | ICD-10-CM | POA: Diagnosis present

## 2020-03-28 DIAGNOSIS — E1142 Type 2 diabetes mellitus with diabetic polyneuropathy: Secondary | ICD-10-CM | POA: Diagnosis present

## 2020-03-28 DIAGNOSIS — N2581 Secondary hyperparathyroidism of renal origin: Secondary | ICD-10-CM | POA: Diagnosis present

## 2020-03-28 DIAGNOSIS — Z833 Family history of diabetes mellitus: Secondary | ICD-10-CM | POA: Diagnosis not present

## 2020-03-28 DIAGNOSIS — Z89512 Acquired absence of left leg below knee: Secondary | ICD-10-CM | POA: Diagnosis not present

## 2020-03-28 DIAGNOSIS — Z992 Dependence on renal dialysis: Secondary | ICD-10-CM

## 2020-03-28 DIAGNOSIS — I252 Old myocardial infarction: Secondary | ICD-10-CM | POA: Diagnosis not present

## 2020-03-28 DIAGNOSIS — L97519 Non-pressure chronic ulcer of other part of right foot with unspecified severity: Secondary | ICD-10-CM | POA: Diagnosis present

## 2020-03-28 DIAGNOSIS — I739 Peripheral vascular disease, unspecified: Secondary | ICD-10-CM | POA: Diagnosis not present

## 2020-03-28 DIAGNOSIS — I5032 Chronic diastolic (congestive) heart failure: Secondary | ICD-10-CM | POA: Diagnosis present

## 2020-03-28 DIAGNOSIS — M869 Osteomyelitis, unspecified: Secondary | ICD-10-CM | POA: Diagnosis present

## 2020-03-28 LAB — GLUCOSE, CAPILLARY
Glucose-Capillary: 112 mg/dL — ABNORMAL HIGH (ref 70–99)
Glucose-Capillary: 120 mg/dL — ABNORMAL HIGH (ref 70–99)
Glucose-Capillary: 69 mg/dL — ABNORMAL LOW (ref 70–99)
Glucose-Capillary: 100 mg/dL — ABNORMAL HIGH (ref 70–99)

## 2020-03-28 LAB — RENAL FUNCTION PANEL
Albumin: 2.4 g/dL — ABNORMAL LOW (ref 3.5–5.0)
Anion gap: 10 (ref 5–15)
BUN: 49 mg/dL — ABNORMAL HIGH (ref 8–23)
CO2: 26 mmol/L (ref 22–32)
Calcium: 8.5 mg/dL — ABNORMAL LOW (ref 8.9–10.3)
Chloride: 97 mmol/L — ABNORMAL LOW (ref 98–111)
Creatinine, Ser: 3.99 mg/dL — ABNORMAL HIGH (ref 0.44–1.00)
GFR, Estimated: 12 mL/min — ABNORMAL LOW (ref >60)
Glucose, Bld: 108 mg/dL — ABNORMAL HIGH (ref 70–99)
Phosphorus: 3.7 mg/dL (ref 2.5–4.6)
Potassium: 3.4 mmol/L — ABNORMAL LOW (ref 3.5–5.1)
Sodium: 133 mmol/L — ABNORMAL LOW (ref 135–145)

## 2020-03-28 LAB — CBC WITH DIFFERENTIAL/PLATELET
Abs Immature Granulocytes: 0.07 10*3/uL (ref 0.00–0.07)
Basophils Absolute: 0 10*3/uL (ref 0.0–0.1)
Basophils Relative: 0 %
Eosinophils Absolute: 0.1 10*3/uL (ref 0.0–0.5)
Eosinophils Relative: 1 %
HCT: 25.8 % — ABNORMAL LOW (ref 36.0–46.0)
Hemoglobin: 8.3 g/dL — ABNORMAL LOW (ref 12.0–15.0)
Immature Granulocytes: 1 %
Lymphocytes Relative: 15 %
Lymphs Abs: 1.9 10*3/uL (ref 0.7–4.0)
MCH: 28.7 pg (ref 26.0–34.0)
MCHC: 32.2 g/dL (ref 30.0–36.0)
MCV: 89.3 fL (ref 80.0–100.0)
Monocytes Absolute: 1.1 10*3/uL — ABNORMAL HIGH (ref 0.1–1.0)
Monocytes Relative: 8 %
Neutro Abs: 9.5 10*3/uL — ABNORMAL HIGH (ref 1.7–7.7)
Neutrophils Relative %: 75 %
Platelets: 179 10*3/uL (ref 150–400)
RBC: 2.89 MIL/uL — ABNORMAL LOW (ref 3.87–5.11)
RDW: 16 % — ABNORMAL HIGH (ref 11.5–15.5)
WBC: 12.6 10*3/uL — ABNORMAL HIGH (ref 4.0–10.5)
nRBC: 0 % (ref 0.0–0.2)

## 2020-03-28 LAB — MAGNESIUM: Magnesium: 1.9 mg/dL (ref 1.7–2.4)

## 2020-03-28 MED ORDER — NEPRO/CARBSTEADY PO LIQD
237.0000 mL | Freq: Two times a day (BID) | ORAL | Status: DC
  Administered 2020-03-28 – 2020-04-01 (×4): 237 mL via ORAL

## 2020-03-28 MED ORDER — POLYETHYLENE GLYCOL 3350 17 G PO PACK
17.0000 g | PACK | Freq: Every day | ORAL | Status: DC
  Administered 2020-03-28 – 2020-03-29 (×2): 17 g via ORAL
  Filled 2020-03-28 (×2): qty 1

## 2020-03-28 MED ORDER — HYDROMORPHONE HCL 1 MG/ML IJ SOLN
INTRAMUSCULAR | Status: AC
  Filled 2020-03-28: qty 0.5

## 2020-03-28 MED ORDER — HYDROMORPHONE HCL 1 MG/ML IJ SOLN
1.0000 mg | Freq: Once | INTRAMUSCULAR | Status: AC
Start: 2020-03-28 — End: 2020-03-28

## 2020-03-28 MED ORDER — SODIUM CHLORIDE 0.9 % IV SOLN: 1.0000 mg/h | Freq: Once | INTRAVENOUS | Status: DC

## 2020-03-28 MED ORDER — DOXERCALCIFEROL 4 MCG/2ML IV SOLN
INTRAVENOUS | Status: AC
  Administered 2020-03-28: 3 ug via INTRAVENOUS
  Filled 2020-03-28: qty 2

## 2020-03-28 MED ORDER — HYDROMORPHONE HCL 1 MG/ML IJ SOLN
INTRAMUSCULAR | Status: AC
  Administered 2020-03-28: 1 mg via INTRAVENOUS
  Filled 2020-03-28: qty 1

## 2020-03-28 MED ORDER — ONDANSETRON HCL 4 MG PO TABS
4.0000 mg | ORAL_TABLET | Freq: Once | ORAL | Status: AC | PRN
  Administered 2020-03-29: 4 mg via ORAL
  Filled 2020-03-28 (×2): qty 1

## 2020-03-28 NOTE — Consult Note (Signed)
Hospital Consult    Reason for Consult: Osteomyelitis right lower extremity with severe PAD Referring Physician: Medicine MRN #:  [id number]  History of Present Illness: This is a 60 y.o. female with history of end-stage renal disease on hemodialysis, CAD, congestive heart failure, diabetes, peripheral arterial disease status post left BKA that vascular surgery has been consulted for a right foot wound.  Patient is seen in dialysis and states she has had a wound on the bottom of her right foot for approximately 1 week.  She was admitted for suspected infected right toe ulcer.  Pain is at the site of the ulcer.  Foot is warm.  Foot is motor sensory intact.  Her ABIs 09/15/2019 were 0.36 on the right monophasic at the ankle.  She previously had a left leg intervention with Dr. Donzetta Matters on 09/25/2019 with laser arthrectomy and later went on to require BKA.  At that time right lower extremity runoff was obtained that showed diffusely diseased SFA with multiple segments of flow-limiting stenosis and dominant runoff in the PT artery.  She remains on Plavix.  Past Medical History:  Diagnosis Date  . Acute congestive heart failure ([place])   . Acute diastolic CHF (congestive heart failure) ([place]) 09/14/2016  . Atrophic vaginitis 06/07/2012  . Avitaminosis D 07/28/2012  . CAD (coronary artery disease)    a. NSTEM 09/2016: cath showing severe diffuse disease of the RCA, ramus and Cx with mild-mod disease of LAD; PCI would require multiple stents and significant contrast usage thus medical therapy recommended.  . Cellulitis 07/12/2015  . Chest pain 01/01/2017  . Chronic diastolic CHF (congestive heart failure) ([place])   . Chronic kidney disease 09/23/2016  . CKD (chronic kidney disease), stage IV ([place])   . Constipation   . COPD GOLD  0 08/31/2017   Quit smoking 07/17/17 - Spirometry 09/01/2017  FEV1 1.23 (58%)  Ratio 57 with mild curvature p no rx - 09/01/2017  After extensive coaching inhaler device,  effectiveness =    75%  with elipta with cough provoked  - PFT's  10/14/2017  FEV1 1.69 (68 % ) ratio 84  p no % improvement from saba p nothing prior to study with DLCO  50 % corrects to 76  % for alv volume   - 10/14/2017  Walked RA x 3 laps @ 1  . Current tobacco use 06/10/2012  . Diabetes mellitus with neuropathy ([place])   . Diabetic foot ulcer ([place]) 07/13/2015  . Diabetic polyneuropathy associated with type 2 diabetes mellitus ([place]) 08/19/2017  . DOE (dyspnea on exertion) 09/01/2017   09/01/2017  Walked RA x 3 laps @ 185 ft each stopped due to  End of study, nl to mod fast  pace, no  desat   But stopped to rest x 2  Spirometry 09/01/2017  FEV1 1.23 (58%)  Ratio 57  > trial of anoro    . Dyspnea    with exertion  . ESRD (end stage renal disease) ([place])    MWF - [facility]  . Essential (primary) hypertension 06/10/2012  . Foot amputation status    a. h/o left foot transmetatarsal amputation  . Headache    Migraines  . High cholesterol   . History of biliary T-tube placement 06/10/2012   Patient unaware   . History of cardiac arrest   . HLD (hyperlipidemia) 09/02/2012   Overview:  ICD-10 cut over    . Hypertension    a. patent renal arteries by PV angio 08/2015. b. heavy proteinuria  09/2016 ? nephrotic.  [name] Hypertensive heart disease with heart failure ([place])   . Hypertriglyceridemia 07/28/2012  . Leg pain 06/10/2012  . Normocytic anemia    pt denies this dx  . NSTEMI (non-ST elevated myocardial infarction) ([place]) 01/01/2017  . Obesity   . Onychomycosis 12/24/2015  . Peripheral nerve disease 09/02/2012   legs  . Peripheral vascular disease ([place]) 07/28/2012  . Pneumonia 2018  . Proteinuria   . PVD (peripheral vascular disease) ([place])    a. status post left SFA and popliteal stent and left SFA PTA with drug coated balloon in 08/2015.  . Status post transmetatarsal amputation of foot, left ([place]) 09/06/2015  . Tobacco abuse    quit cigarettes 2019, smokes marijuana  . Type 2 diabetes mellitus ([place]) 06/07/2012  . Type 2  diabetes mellitus with peripheral angiopathy ([place]) 09/02/2012    Past Surgical History:  Procedure Laterality Date  . ABDOMINAL AORTOGRAM W/LOWER EXTREMITY Left 09/25/2019   Procedure: ABDOMINAL AORTOGRAM W/LOWER EXTREMITY;  Surgeon: Waynetta Sandy, MD;  Location: [facility];  Service: Cardiovascular;  Laterality: Left;  . ABDOMINAL HYSTERECTOMY    . AMPUTATION Left 09/06/2015   Procedure: LEFT TRANSMETATARSAL AMPUTATTION;  Surgeon: Newt Minion, MD;  Location: [facility];  Service: Orthopedics;  Laterality: Left;  . AMPUTATION Left 10/18/2019   Procedure: LEFT BELOW KNEE AMPUTATION;  Surgeon: Newt Minion, MD;  Location: [facility];  Service: Orthopedics;  Laterality: Left;  . AV FISTULA PLACEMENT Left 12/13/2018   Procedure: ARTERIOVENOUS (AV) FISTULA CREATION LEFT UPPER ARM;  Surgeon: Waynetta Sandy, MD;  Location: [facility];  Service: Vascular;  Laterality: Left;  . BASCILIC VEIN TRANSPOSITION Left 03/30/2019   Procedure: LEFT ARM ARTERIOVENOUS VEIN TRANSPOSITION;  Surgeon: Serafina Mitchell, MD;  Location: [facility];  Service: Vascular;  Laterality: Left;  . COLONOSCOPY     polyps x 1  . LEFT HEART CATH AND CORONARY ANGIOGRAPHY N/A 09/17/2016   Procedure: LEFT HEART CATH AND CORONARY ANGIOGRAPHY;  Surgeon: Jettie Booze, MD;  Location: [facility];  Service: Cardiovascular;  Laterality: N/A;  . PERIPHERAL VASCULAR ATHERECTOMY Left 09/25/2019   Procedure: PERIPHERAL VASCULAR ATHERECTOMY;  Surgeon: Waynetta Sandy, MD;  Location: [facility];  Service: Cardiovascular;  Laterality: Left;  SFA  . PERIPHERAL VASCULAR CATHETERIZATION N/A 08/16/2015   Procedure: Abdominal Aortogram;  Surgeon: Elam Dutch, MD;  Location: [facility];  Service: Cardiovascular;  Laterality: N/A;  . PERIPHERAL VASCULAR CATHETERIZATION Bilateral 08/16/2015   Procedure: Lower Extremity Angiography;  Surgeon: Elam Dutch, MD;  Location: [facility];  Service:  Cardiovascular;  Laterality: Bilateral;  . PERIPHERAL VASCULAR CATHETERIZATION Left 08/16/2015   Procedure: Peripheral Vascular Intervention;  Surgeon: Elam Dutch, MD;  Location: [facility];  Service: Cardiovascular;  Laterality: Left;  SFA STENT X 2  . PERIPHERAL VASCULAR INTERVENTION Left 09/25/2019   Procedure: PERIPHERAL VASCULAR INTERVENTION;  Surgeon: Waynetta Sandy, MD;  Location: [facility];  Service: Cardiovascular;  Laterality: Left;  SFA    No Known Allergies  Prior to Admission medications   Medication Sig Start Date End Date Taking? Authorizing Provider  amLODipine (NORVASC) 5 MG tablet Take 1 tablet (5 mg total) by mouth daily. 08/22/19  Yes Dorothy Spark, MD  Ascorbic Acid (VITAMIN C) 1000 MG tablet Take 1,000 mg by mouth daily.   Yes [provider]  aspirin 81 MG EC tablet Take 1 tablet (81 mg total)  by mouth daily. Reported on 07/13/2015 07/02/16  Yes Charlott Rakes, MD  atorvastatin (LIPITOR) 80 MG tablet Take 1 tablet (80 mg total) by mouth daily. 10/07/17  Yes Scot Jun, FNP  atropine 1 % ophthalmic solution Place 1 drop into the left eye daily.   Yes [provider]  brimonidine (ALPHAGAN) 0.2 % ophthalmic solution Place 1 drop into both eyes every 8 (eight) hours. 02/29/20  Yes [provider]  cholecalciferol (VITAMIN D3) 25 MCG (1000 UT) tablet Take 1,000 Units by mouth daily.   Yes [provider]  clopidogrel (PLAVIX) 75 MG tablet Take 1 tablet (75 mg total) by mouth daily. 10/07/17  Yes Scot Jun, FNP  dorzolamide-timolol (COSOPT) 22.3-6.8 MG/ML ophthalmic solution Place 1 drop into both eyes 2 (two) times daily. 02/29/20  Yes [provider]  doxycycline (VIBRA-TABS) 100 MG tablet Take 1 tablet (100 mg total) by mouth 2 (two) times daily. 03/26/20  Yes Dondra Prader R, NP  latanoprost (XALATAN) 0.005 % ophthalmic solution Place 1 drop into both eyes at bedtime.    Yes [provider]  lidocaine-prilocaine (EMLA) cream Apply 1 application topically 3 (three) times a week. 02/05/20  Yes [provider]  prednisoLONE acetate (PRED FORTE) 1 % ophthalmic suspension Place 1 drop into the left eye 4 (four) times daily.   Yes [provider]  sucroferric oxyhydroxide (VELPHORO) 500 MG chewable tablet Chew 1,000 mg by mouth as directed. Take 2 tablets (1000 mg) TID and Take 2 tablets (1000 mg) with snack   Yes [provider]  Blood Glucose Monitoring Suppl (ACCU-CHEK AVIVA) device Use as instructed three times daily. Patient taking differently: Use as instructed twice times daily. 04/22/16   Charlott Rakes, MD  carvedilol (COREG) 25 MG tablet Take 1 tablet (25 mg total) by mouth 2 (two) times daily. Patient not taking: No sig reported 10/07/17   Scot Jun, FNP  furosemide (LASIX) 80 MG tablet Take 80 mg by mouth 2 (two) times daily. Patient not taking: No sig reported 03/21/18   [provider]  gabapentin (NEURONTIN) 300 MG capsule Take 1 capsule (300 mg total) by mouth 3 (three) times daily. Patient not taking: No sig reported 10/07/17   Scot Jun, FNP  glipiZIDE (GLUCOTROL) 5 MG tablet Take 0.5 tablets (2.5 mg total) by mouth daily before breakfast. Patient not taking: No sig reported 11/01/18   Renato Shin, MD  glucose blood (ACCU-CHEK AVIVA) test strip Use as instructed three times daily before meals. 10/14/17   Charlott Rakes, MD  lactulose (CHRONULAC) 10 GM/15ML solution Take 15 mLs (10 g total) by mouth 3 (three) times daily. Patient not taking: No sig reported 03/21/19   Dalia Heading, PA-C  Lancet Devices [facility]) lancets Use as instructed three times daily before meals. 04/22/16   Charlott Rakes, MD  linaclotide (LINZESS) 145 MCG CAPS capsule Take 1 capsule (145 mcg total) by mouth daily before breakfast. Take ~ 30 minutes before breakfast, on an empty stomach Patient not taking: No sig  reported 10/09/19   Sponseller, Rebekah R, PA-C  nitroGLYCERIN (NITRODUR - DOSED IN MG/24 HR) 0.2 mg/hr patch Place 1 patch (0.2 mg total) onto the skin daily. Patient not taking: No sig reported 08/15/19   Newt Minion, MD  nitroGLYCERIN (NITROSTAT) 0.4 MG SL tablet Place 1 tablet (0.4 mg total) under the tongue every 5 (five) minutes as needed for chest pain. 01/18/17 10/12/19  Dorothy Spark, MD  Omega-3 Fatty  Acids (FISH OIL) 1000 MG CAPS Take 1,000 mg by mouth daily.     [provider]  oxyCODONE-acetaminophen (PERCOCET/ROXICET) 5-325 MG tablet Take 1 tablet by mouth every 8 (eight) hours as needed for severe pain. 03/26/20   Suzan Slick, NP  ranolazine (RANEXA) 500 MG 12 hr tablet Take 1 tablet by mouth twice daily Patient not taking: No sig reported 07/04/19   Dorothy Spark, MD  silver sulfADIAZINE (SILVADENE) 1 % cream Apply 1 application topically daily. Apply to affected area daily plus dry dressing Patient not taking: No sig reported 07/11/18   Newt Minion, MD  sitaGLIPtin (JANUVIA) 25 MG tablet Take 1 tablet (25 mg total) by mouth daily. Patient not taking: No sig reported 10/07/17   Scot Jun, FNP    Social History   Socioeconomic History  . Marital status: Legally Separated    Spouse name: Not on file  . Number of children: Not on file  . Years of education: Not on file  . Highest education level: Not on file  Occupational History  . Not on file  Tobacco Use  . Smoking status: Former Smoker    Packs/day: 1.00    Years: 42.00    Pack years: 42.00    Types: Cigarettes    Quit date: 07/17/2017    Years since quitting: 2.6  . Smokeless tobacco: Never Used  Vaping Use  . Vaping Use: Never used  Substance and Sexual Activity  . Alcohol use: Not Currently    Alcohol/week: 1.0 - 2.0 standard drink    Types: 1 - 2 Glasses of wine per week    Comment: on social occassions  . Drug use: Yes    Frequency: 7.0 times per week    Types: Marijuana     Comment: daily use - last use 03/28/19  . Sexual activity: Not on file    Comment: Hysterectomy  Other Topics Concern  . Not on file  Social History Narrative  . Not on file   Social Determinants of Health   Financial Resource Strain: Not on file  Food Insecurity: Not on file  Transportation Needs: Not on file  Physical Activity: Not on file  Stress: Not on file  Social Connections: Not on file  Intimate Partner Violence: Not on file     Family History  Problem Relation Age of Onset  . Diabetes Mother   . Hypertension Father   . Colon cancer Neg Hx   . Stomach cancer Neg Hx   . Pancreatic cancer Neg Hx     ROS: [x]  Positive   [ ]  Negative   [ ]  All sytems reviewed and are negative  Cardiovascular: []  chest pain/pressure []  palpitations []  SOB lying flat []  DOE []  pain in legs while walking []  pain in legs at rest []  pain in legs at night []  non-healing ulcers []  hx of DVT []  swelling in legs  Pulmonary: []  productive cough []  asthma/wheezing []  home O2  Neurologic: []  weakness in []  arms []  legs []  numbness in []  arms []  legs []  hx of CVA []  mini stroke [] difficulty speaking or slurred speech []  temporary loss of vision in one eye []  dizziness  Hematologic: []  hx of cancer []  bleeding problems []  problems with blood clotting easily  Endocrine:   []  diabetes []  thyroid disease  GI []  vomiting blood []  blood in stool  GU: []  CKD/renal failure []  HD--[]  M/W/F or []  T/T/S []  burning with urination []  blood  in urine  Psychiatric: []  anxiety []  depression  Musculoskeletal: []  arthritis []  joint pain  Integumentary: []  rashes []  ulcers  Constitutional: []  fever []  chills   Physical Examination  Vitals:   03/28/20 1530 03/28/20 1600  BP: (!) 156/79 (!) 159/80  Pulse: 77 83  Resp:    Temp:    SpO2:     Body mass index is 25.85 kg/m.  General:  NAD Gait: Not observed HENT: WNL, normocephalic Pulmonary: normal non-labored  breathing, without Rales, rhonchi,  wheezing Cardiac: regular, without  Murmurs, rubs or gallops Abdomen: soft Vascular Exam/Pulses: Palpable femoral pulses bilateral Left BKA well-healed Right foot has an ulcer at the metatarsal head with no evidence of drainage, no palpable pedal pulse but foot is warm and motor/sensory intact Musculoskeletal: no muscle wasting or atrophy  Neurologic: A&O X 3; Appropriate Affect ; SENSATION: normal; MOTOR FUNCTION:  moving all extremities equally. Speech is fluent/normal      CBC    Component Value Date/Time   WBC 12.6 (H) 03/28/2020 0252   RBC 2.89 (L) 03/28/2020 0252   HGB 8.3 (L) 03/28/2020 0252   HGB 12.9 04/27/2019 0951   HGB 11.6 01/14/2017 0958   HCT 25.8 (L) 03/28/2020 0252   HCT 35.9 01/14/2017 0958   PLT 179 03/28/2020 0252   PLT 334 04/27/2019 0951   PLT 308 01/14/2017 0958   MCV 89.3 03/28/2020 0252   MCV 81 01/14/2017 0958   MCH 28.7 03/28/2020 0252   MCHC 32.2 03/28/2020 0252   RDW 16.0 (H) 03/28/2020 0252   RDW 14.9 01/14/2017 0958   LYMPHSABS 1.9 03/28/2020 0252   LYMPHSABS 2.6 01/14/2017 0958   MONOABS 1.1 (H) 03/28/2020 0252   EOSABS 0.1 03/28/2020 0252   EOSABS 0.2 01/14/2017 0958   BASOSABS 0.0 03/28/2020 0252   BASOSABS 0.1 01/14/2017 0958    BMET    Component Value Date/Time   NA 133 (L) 03/28/2020 0252   NA 141 10/07/2017 1234   K 3.4 (L) 03/28/2020 0252   CL 97 (L) 03/28/2020 0252   CO2 26 03/28/2020 0252   GLUCOSE 108 (H) 03/28/2020 0252   BUN 49 (H) 03/28/2020 0252   BUN 33 (H) 10/07/2017 1234   CREATININE 3.99 (H) 03/28/2020 0252   CREATININE 4.15 (HH) 04/27/2019 0951   CREATININE 1.19 (H) 12/18/2015 1006   CALCIUM 8.5 (L) 03/28/2020 0252   GFRNONAA 12 (L) 03/28/2020 0252   GFRNONAA 11 (L) 04/27/2019 0951   GFRNONAA 51 (L) 12/18/2015 1006   GFRAA 7 (L) 10/09/2019 0717   GFRAA 13 (L) 04/27/2019 0951   GFRAA 59 (L) 12/18/2015 1006    COAGS: Lab Results  Component Value Date   INR 0.98  03/02/2017   INR 1.00 09/17/2016     Non-Invasive Vascular Imaging:    ABIs 09/15/2019 0.36 on the right monophasic   ASSESSMENT/PLAN: This is a 60 y.o. female with multiple medical comorbidities as noted above that presents with ulceration at the first metatarsal head.  She has known severe peripheral arterial disease and her ABIs last year were 0.36 on the right and monophasic at the ankle.  This all appears to be chronic disease given that her foot is warm and motor/sensory intact.  I recommended aortogram with right lower extremity arteriogram and possible intervention tomorrow in the Cath Lab with Dr. Oneida Alar.  Patient is very tearful in dialysis and states that she may not want to have any procedures tomorrow and would rather come back as an outpatient at  a later date.  Discussed this is certainly is a limb threatening situation.  Discussed I will leave her on the schedule tomorrow and would like for her to be n.p.o. after midnight and we can certainly cancel tomorrow if she decides she does not want to proceed with arteriogram/intervention.  Risk benefits were discussed with her.  Marty Heck, MD [facility] Office: [place]

## 2020-03-28 NOTE — Progress Notes (Signed)
Patient compliant with care. Pain managed with prn dilaudid. Patient to go to dialysis at 1230.

## 2020-03-28 NOTE — Progress Notes (Signed)
Date: 03/28/2020  Patient name: Rebekah Johnson  Medical record number: [id number]  Date of birth: 07/12/1960   I have seen and evaluated Drinda Butts and discussed their care with the Residency Team.  In brief, patient is a 60 year old female with past medical history of ESRD on hemodialysis, hypertension, CAD, chronic diastolic heart failure, PAD status post left BKA, COPD, PUD and type 2 diabetes who presented to the ED with an ulcer at the base of her right great toe.  Patient states that approximately 1 week ago she noted pain around her right great toe.  Patient also noted the development of a small ulcer at the base of her right great toe.  Pain progressively worsened and she went to follow-up with orthopedics yesterday.  Patient was given pain medications and antibiotics (Bactrim) but has not started these medications yet.  Patient states that she developed worsening pain overnight and came to the ED for further evaluation.  No fevers or chills, no chest pain, no lightheadedness, no syncope, no focal weakness, no tingling or numbness, no diarrhea, no abdominal pain.  Patient does complain of some chronic nausea with intermittent emesis.  Today, patient states that the pain is still present over her right great toe but mildly improved.  She would like to go home soon.  PMHx, Fam Hx, and/or Soc Hx : As per resident admit note  Vitals:   03/28/20 1400 03/28/20 1430  BP: (!) 143/65 (!) 149/72  Pulse: 66 66  Resp:    Temp:    SpO2:     General: Awake, alert, oriented x3, NAD CVS: Regular rate and rhythm, normal heart sounds Lungs: CTA bilaterally Abdomen: Soft, nontender, nondistended, regular bowel sounds Extremities: Ulcer noted at the base of the right great toe with no active drainage, no edema noted, nontender to palpation, no palpable DP or posterior tibial pulse in the right lower extremity Psych: Normal mood and affect HEENT: Normocephalic, atraumatic Skin: Darkish  discoloration around the ulcer at the base of the foot  Assessment and Plan: I have seen and evaluated the patient as outlined above. I agree with the formulated Assessment and Plan as detailed in the residents' note, with the following changes:   1.  Likely infected right toe ulcer with surrounding cellulitis: -Patient presented to ED with progressive worsening pain in her right foot especially at the base of the right great toe at the site of her ulcer.  No purulent discharge noted at the ulcer site but patient was noted to have an elevated white count at 20 as well as persistent pain in that foot.  There was concern for underlying infected ulcer as well as possible surrounding cellulitis.  She did follow-up with orthopedics and was given a course of Bactrim and Percocet but presented to the ED secondary to worsening pain. -We will continue with IV Zosyn for now -Right foot MRI showed mild edema in the diaphysis of the second metatarsal and edema in the proximal phalanx of the third toe and medial aspect of the head of the second metatarsal which is nonspecific.  Patient also had subcutaneous edema likely secondary to cellulitis -Patient was noted to have severe right lower extremity arterial disease on prior ABI in September of last year.  Will obtain vascular surgery consult with Dr. Donzetta Matters given her right foot infection, claudication pain and ulcer at the base of the right great toe.  Follow-up repeat ABIs -Continue pain control for now -We will follow up blood cultures -  Of note, patient does have ESRD on hemodialysis.  We will consult nephrology to resume hemodialysis today. -No further work-up at this time.  We will continue to monitor closely  Aldine Contes, MD 3/24/20222:59 PM

## 2020-03-28 NOTE — H&P (View-Only) (Signed)
Hospital Consult    Reason for Consult: Osteomyelitis right lower extremity with severe PAD Referring Physician: Medicine MRN #:  [id number]  History of Present Illness: This is a 60 y.o. female with history of end-stage renal disease on hemodialysis, CAD, congestive heart failure, diabetes, peripheral arterial disease status post left BKA that vascular surgery has been consulted for a right foot wound.  Patient is seen in dialysis and states she has had a wound on the bottom of her right foot for approximately 1 week.  She was admitted for suspected infected right toe ulcer.  Pain is at the site of the ulcer.  Foot is warm.  Foot is motor sensory intact.  Her ABIs 09/15/2019 were 0.36 on the right monophasic at the ankle.  She previously had a left leg intervention with Dr. Donzetta Matters on 09/25/2019 with laser arthrectomy and later went on to require BKA.  At that time right lower extremity runoff was obtained that showed diffusely diseased SFA with multiple segments of flow-limiting stenosis and dominant runoff in the PT artery.  She remains on Plavix.  Past Medical History:  Diagnosis Date  . Acute congestive heart failure ([place])   . Acute diastolic CHF (congestive heart failure) ([place]) 09/14/2016  . Atrophic vaginitis 06/07/2012  . Avitaminosis D 07/28/2012  . CAD (coronary artery disease)    a. NSTEM 09/2016: cath showing severe diffuse disease of the RCA, ramus and Cx with mild-mod disease of LAD; PCI would require multiple stents and significant contrast usage thus medical therapy recommended.  . Cellulitis 07/12/2015  . Chest pain 01/01/2017  . Chronic diastolic CHF (congestive heart failure) ([place])   . Chronic kidney disease 09/23/2016  . CKD (chronic kidney disease), stage IV ([place])   . Constipation   . COPD GOLD  0 08/31/2017   Quit smoking 07/17/17 - Spirometry 09/01/2017  FEV1 1.23 (58%)  Ratio 57 with mild curvature p no rx - 09/01/2017  After extensive coaching inhaler device,  effectiveness =    75%  with elipta with cough provoked  - PFT's  10/14/2017  FEV1 1.69 (68 % ) ratio 84  p no % improvement from saba p nothing prior to study with DLCO  50 % corrects to 76  % for alv volume   - 10/14/2017  Walked RA x 3 laps @ 1  . Current tobacco use 06/10/2012  . Diabetes mellitus with neuropathy ([place])   . Diabetic foot ulcer ([place]) 07/13/2015  . Diabetic polyneuropathy associated with type 2 diabetes mellitus ([place]) 08/19/2017  . DOE (dyspnea on exertion) 09/01/2017   09/01/2017  Walked RA x 3 laps @ 185 ft each stopped due to  End of study, nl to mod fast  pace, no  desat   But stopped to rest x 2  Spirometry 09/01/2017  FEV1 1.23 (58%)  Ratio 57  > trial of anoro    . Dyspnea    with exertion  . ESRD (end stage renal disease) ([place])    MWF - [facility]  . Essential (primary) hypertension 06/10/2012  . Foot amputation status    a. h/o left foot transmetatarsal amputation  . Headache    Migraines  . High cholesterol   . History of biliary T-tube placement 06/10/2012   Patient unaware   . History of cardiac arrest   . HLD (hyperlipidemia) 09/02/2012   Overview:  ICD-10 cut over    . Hypertension    a. patent renal arteries by PV angio 08/2015. b. heavy proteinuria  09/2016 ? nephrotic.  [name] Hypertensive heart disease with heart failure ([place])   . Hypertriglyceridemia 07/28/2012  . Leg pain 06/10/2012  . Normocytic anemia    pt denies this dx  . NSTEMI (non-ST elevated myocardial infarction) ([place]) 01/01/2017  . Obesity   . Onychomycosis 12/24/2015  . Peripheral nerve disease 09/02/2012   legs  . Peripheral vascular disease ([place]) 07/28/2012  . Pneumonia 2018  . Proteinuria   . PVD (peripheral vascular disease) ([place])    a. status post left SFA and popliteal stent and left SFA PTA with drug coated balloon in 08/2015.  . Status post transmetatarsal amputation of foot, left ([place]) 09/06/2015  . Tobacco abuse    quit cigarettes 2019, smokes marijuana  . Type 2 diabetes mellitus ([place]) 06/07/2012  . Type 2  diabetes mellitus with peripheral angiopathy ([place]) 09/02/2012    Past Surgical History:  Procedure Laterality Date  . ABDOMINAL AORTOGRAM W/LOWER EXTREMITY Left 09/25/2019   Procedure: ABDOMINAL AORTOGRAM W/LOWER EXTREMITY;  Surgeon: Waynetta Sandy, MD;  Location: [facility];  Service: Cardiovascular;  Laterality: Left;  . ABDOMINAL HYSTERECTOMY    . AMPUTATION Left 09/06/2015   Procedure: LEFT TRANSMETATARSAL AMPUTATTION;  Surgeon: Newt Minion, MD;  Location: [facility];  Service: Orthopedics;  Laterality: Left;  . AMPUTATION Left 10/18/2019   Procedure: LEFT BELOW KNEE AMPUTATION;  Surgeon: Newt Minion, MD;  Location: [facility];  Service: Orthopedics;  Laterality: Left;  . AV FISTULA PLACEMENT Left 12/13/2018   Procedure: ARTERIOVENOUS (AV) FISTULA CREATION LEFT UPPER ARM;  Surgeon: Waynetta Sandy, MD;  Location: [place] [facility];  Service: Vascular;  Laterality: Left;  . BASCILIC VEIN TRANSPOSITION Left 03/30/2019   Procedure: LEFT ARM ARTERIOVENOUS VEIN TRANSPOSITION;  Surgeon: Serafina Mitchell, MD;  Location: [facility];  Service: Vascular;  Laterality: Left;  . COLONOSCOPY     polyps x 1  . LEFT HEART CATH AND CORONARY ANGIOGRAPHY N/A 09/17/2016   Procedure: LEFT HEART CATH AND CORONARY ANGIOGRAPHY;  Surgeon: Jettie Booze, MD;  Location: [facility];  Service: Cardiovascular;  Laterality: N/A;  . PERIPHERAL VASCULAR ATHERECTOMY Left 09/25/2019   Procedure: PERIPHERAL VASCULAR ATHERECTOMY;  Surgeon: Waynetta Sandy, MD;  Location: [facility];  Service: Cardiovascular;  Laterality: Left;  SFA  . PERIPHERAL VASCULAR CATHETERIZATION N/A 08/16/2015   Procedure: Abdominal Aortogram;  Surgeon: Elam Dutch, MD;  Location: [facility];  Service: Cardiovascular;  Laterality: N/A;  . PERIPHERAL VASCULAR CATHETERIZATION Bilateral 08/16/2015   Procedure: Lower Extremity Angiography;  Surgeon: Elam Dutch, MD;  Location: [facility];  Service:  Cardiovascular;  Laterality: Bilateral;  . PERIPHERAL VASCULAR CATHETERIZATION Left 08/16/2015   Procedure: Peripheral Vascular Intervention;  Surgeon: Elam Dutch, MD;  Location: [facility];  Service: Cardiovascular;  Laterality: Left;  SFA STENT X 2  . PERIPHERAL VASCULAR INTERVENTION Left 09/25/2019   Procedure: PERIPHERAL VASCULAR INTERVENTION;  Surgeon: Waynetta Sandy, MD;  Location: [facility];  Service: Cardiovascular;  Laterality: Left;  SFA    No Known Allergies  Prior to Admission medications   Medication Sig Start Date End Date Taking? Authorizing Provider  amLODipine (NORVASC) 5 MG tablet Take 1 tablet (5 mg total) by mouth daily. 08/22/19  Yes Dorothy Spark, MD  Ascorbic Acid (VITAMIN C) 1000 MG tablet Take 1,000 mg by mouth daily.   Yes [provider]  aspirin 81 MG EC tablet Take 1 tablet (81 mg total)  by mouth daily. Reported on 07/13/2015 07/02/16  Yes Charlott Rakes, MD  atorvastatin (LIPITOR) 80 MG tablet Take 1 tablet (80 mg total) by mouth daily. 10/07/17  Yes Scot Jun, FNP  atropine 1 % ophthalmic solution Place 1 drop into the left eye daily.   Yes [provider]  brimonidine (ALPHAGAN) 0.2 % ophthalmic solution Place 1 drop into both eyes every 8 (eight) hours. 02/29/20  Yes [provider]  cholecalciferol (VITAMIN D3) 25 MCG (1000 UT) tablet Take 1,000 Units by mouth daily.   Yes [provider]  clopidogrel (PLAVIX) 75 MG tablet Take 1 tablet (75 mg total) by mouth daily. 10/07/17  Yes Scot Jun, FNP  dorzolamide-timolol (COSOPT) 22.3-6.8 MG/ML ophthalmic solution Place 1 drop into both eyes 2 (two) times daily. 02/29/20  Yes [provider]  doxycycline (VIBRA-TABS) 100 MG tablet Take 1 tablet (100 mg total) by mouth 2 (two) times daily. 03/26/20  Yes Dondra Prader R, NP  latanoprost (XALATAN) 0.005 % ophthalmic solution Place 1 drop into both eyes at bedtime.    Yes [provider]  lidocaine-prilocaine (EMLA) cream Apply 1 application topically 3 (three) times a week. 02/05/20  Yes [provider]  prednisoLONE acetate (PRED FORTE) 1 % ophthalmic suspension Place 1 drop into the left eye 4 (four) times daily.   Yes [provider]  sucroferric oxyhydroxide (VELPHORO) 500 MG chewable tablet Chew 1,000 mg by mouth as directed. Take 2 tablets (1000 mg) TID and Take 2 tablets (1000 mg) with snack   Yes [provider]  Blood Glucose Monitoring Suppl (ACCU-CHEK AVIVA) device Use as instructed three times daily. Patient taking differently: Use as instructed twice times daily. 04/22/16   Charlott Rakes, MD  carvedilol (COREG) 25 MG tablet Take 1 tablet (25 mg total) by mouth 2 (two) times daily. Patient not taking: No sig reported 10/07/17   Scot Jun, FNP  furosemide (LASIX) 80 MG tablet Take 80 mg by mouth 2 (two) times daily. Patient not taking: No sig reported 03/21/18   [provider]  gabapentin (NEURONTIN) 300 MG capsule Take 1 capsule (300 mg total) by mouth 3 (three) times daily. Patient not taking: No sig reported 10/07/17   Scot Jun, FNP  glipiZIDE (GLUCOTROL) 5 MG tablet Take 0.5 tablets (2.5 mg total) by mouth daily before breakfast. Patient not taking: No sig reported 11/01/18   Renato Shin, MD  glucose blood (ACCU-CHEK AVIVA) test strip Use as instructed three times daily before meals. 10/14/17   Charlott Rakes, MD  lactulose (CHRONULAC) 10 GM/15ML solution Take 15 mLs (10 g total) by mouth 3 (three) times daily. Patient not taking: No sig reported 03/21/19   Dalia Heading, PA-C  Lancet Devices [facility]) lancets Use as instructed three times daily before meals. 04/22/16   Charlott Rakes, MD  linaclotide (LINZESS) 145 MCG CAPS capsule Take 1 capsule (145 mcg total) by mouth daily before breakfast. Take ~ 30 minutes before breakfast, on an empty stomach Patient not taking: No sig  reported 10/09/19   Sponseller, Rebekah R, PA-C  nitroGLYCERIN (NITRODUR - DOSED IN MG/24 HR) 0.2 mg/hr patch Place 1 patch (0.2 mg total) onto the skin daily. Patient not taking: No sig reported 08/15/19   Newt Minion, MD  nitroGLYCERIN (NITROSTAT) 0.4 MG SL tablet Place 1 tablet (0.4 mg total) under the tongue every 5 (five) minutes as needed for chest pain. 01/18/17 10/12/19  Dorothy Spark, MD  Omega-3 Fatty  Acids (FISH OIL) 1000 MG CAPS Take 1,000 mg by mouth daily.     [provider]  oxyCODONE-acetaminophen (PERCOCET/ROXICET) 5-325 MG tablet Take 1 tablet by mouth every 8 (eight) hours as needed for severe pain. 03/26/20   Suzan Slick, NP  ranolazine (RANEXA) 500 MG 12 hr tablet Take 1 tablet by mouth twice daily Patient not taking: No sig reported 07/04/19   Dorothy Spark, MD  silver sulfADIAZINE (SILVADENE) 1 % cream Apply 1 application topically daily. Apply to affected area daily plus dry dressing Patient not taking: No sig reported 07/11/18   Newt Minion, MD  sitaGLIPtin (JANUVIA) 25 MG tablet Take 1 tablet (25 mg total) by mouth daily. Patient not taking: No sig reported 10/07/17   Scot Jun, FNP    Social History   Socioeconomic History  . Marital status: Legally Separated    Spouse name: Not on file  . Number of children: Not on file  . Years of education: Not on file  . Highest education level: Not on file  Occupational History  . Not on file  Tobacco Use  . Smoking status: Former Smoker    Packs/day: 1.00    Years: 42.00    Pack years: 42.00    Types: Cigarettes    Quit date: 07/17/2017    Years since quitting: 2.6  . Smokeless tobacco: Never Used  Vaping Use  . Vaping Use: Never used  Substance and Sexual Activity  . Alcohol use: Not Currently    Alcohol/week: 1.0 - 2.0 standard drink    Types: 1 - 2 Glasses of wine per week    Comment: on social occassions  . Drug use: Yes    Frequency: 7.0 times per week    Types: Marijuana     Comment: daily use - last use 03/28/19  . Sexual activity: Not on file    Comment: Hysterectomy  Other Topics Concern  . Not on file  Social History Narrative  . Not on file   Social Determinants of Health   Financial Resource Strain: Not on file  Food Insecurity: Not on file  Transportation Needs: Not on file  Physical Activity: Not on file  Stress: Not on file  Social Connections: Not on file  Intimate Partner Violence: Not on file     Family History  Problem Relation Age of Onset  . Diabetes Mother   . Hypertension Father   . Colon cancer Neg Hx   . Stomach cancer Neg Hx   . Pancreatic cancer Neg Hx     ROS: [x]  Positive   [ ]  Negative   [ ]  All sytems reviewed and are negative  Cardiovascular: []  chest pain/pressure []  palpitations []  SOB lying flat []  DOE []  pain in legs while walking []  pain in legs at rest []  pain in legs at night []  non-healing ulcers []  hx of DVT []  swelling in legs  Pulmonary: []  productive cough []  asthma/wheezing []  home O2  Neurologic: []  weakness in []  arms []  legs []  numbness in []  arms []  legs []  hx of CVA []  mini stroke [] difficulty speaking or slurred speech []  temporary loss of vision in one eye []  dizziness  Hematologic: []  hx of cancer []  bleeding problems []  problems with blood clotting easily  Endocrine:   []  diabetes []  thyroid disease  GI []  vomiting blood []  blood in stool  GU: []  CKD/renal failure []  HD--[]  M/W/F or []  T/T/S []  burning with urination []  blood  in urine  Psychiatric: []  anxiety []  depression  Musculoskeletal: []  arthritis []  joint pain  Integumentary: []  rashes []  ulcers  Constitutional: []  fever []  chills   Physical Examination  Vitals:   03/28/20 1530 03/28/20 1600  BP: (!) 156/79 (!) 159/80  Pulse: 77 83  Resp:    Temp:    SpO2:     Body mass index is 25.85 kg/m.  General:  NAD Gait: Not observed HENT: WNL, normocephalic Pulmonary: normal non-labored  breathing, without Rales, rhonchi,  wheezing Cardiac: regular, without  Murmurs, rubs or gallops Abdomen: soft Vascular Exam/Pulses: Palpable femoral pulses bilateral Left BKA well-healed Right foot has an ulcer at the metatarsal head with no evidence of drainage, no palpable pedal pulse but foot is warm and motor/sensory intact Musculoskeletal: no muscle wasting or atrophy  Neurologic: A&O X 3; Appropriate Affect ; SENSATION: normal; MOTOR FUNCTION:  moving all extremities equally. Speech is fluent/normal      CBC    Component Value Date/Time   WBC 12.6 (H) 03/28/2020 0252   RBC 2.89 (L) 03/28/2020 0252   HGB 8.3 (L) 03/28/2020 0252   HGB 12.9 04/27/2019 0951   HGB 11.6 01/14/2017 0958   HCT 25.8 (L) 03/28/2020 0252   HCT 35.9 01/14/2017 0958   PLT 179 03/28/2020 0252   PLT 334 04/27/2019 0951   PLT 308 01/14/2017 0958   MCV 89.3 03/28/2020 0252   MCV 81 01/14/2017 0958   MCH 28.7 03/28/2020 0252   MCHC 32.2 03/28/2020 0252   RDW 16.0 (H) 03/28/2020 0252   RDW 14.9 01/14/2017 0958   LYMPHSABS 1.9 03/28/2020 0252   LYMPHSABS 2.6 01/14/2017 0958   MONOABS 1.1 (H) 03/28/2020 0252   EOSABS 0.1 03/28/2020 0252   EOSABS 0.2 01/14/2017 0958   BASOSABS 0.0 03/28/2020 0252   BASOSABS 0.1 01/14/2017 0958    BMET    Component Value Date/Time   NA 133 (L) 03/28/2020 0252   NA 141 10/07/2017 1234   K 3.4 (L) 03/28/2020 0252   CL 97 (L) 03/28/2020 0252   CO2 26 03/28/2020 0252   GLUCOSE 108 (H) 03/28/2020 0252   BUN 49 (H) 03/28/2020 0252   BUN 33 (H) 10/07/2017 1234   CREATININE 3.99 (H) 03/28/2020 0252   CREATININE 4.15 (HH) 04/27/2019 0951   CREATININE 1.19 (H) 12/18/2015 1006   CALCIUM 8.5 (L) 03/28/2020 0252   GFRNONAA 12 (L) 03/28/2020 0252   GFRNONAA 11 (L) 04/27/2019 0951   GFRNONAA 51 (L) 12/18/2015 1006   GFRAA 7 (L) 10/09/2019 0717   GFRAA 13 (L) 04/27/2019 0951   GFRAA 59 (L) 12/18/2015 1006    COAGS: Lab Results  Component Value Date   INR 0.98  03/02/2017   INR 1.00 09/17/2016     Non-Invasive Vascular Imaging:    ABIs 09/15/2019 0.36 on the right monophasic   ASSESSMENT/PLAN: This is a 60 y.o. female with multiple medical comorbidities as noted above that presents with ulceration at the first metatarsal head.  She has known severe peripheral arterial disease and her ABIs last year were 0.36 on the right and monophasic at the ankle.  This all appears to be chronic disease given that her foot is warm and motor/sensory intact.  I recommended aortogram with right lower extremity arteriogram and possible intervention tomorrow in the Cath Lab with Dr. Oneida Alar.  Patient is very tearful in dialysis and states that she may not want to have any procedures tomorrow and would rather come back as an outpatient at  a later date.  Discussed this is certainly is a limb threatening situation.  Discussed I will leave her on the schedule tomorrow and would like for her to be n.p.o. after midnight and we can certainly cancel tomorrow if she decides she does not want to proceed with arteriogram/intervention.  Risk benefits were discussed with her.  Marty Heck, MD [facility] Office: [place]

## 2020-03-28 NOTE — Progress Notes (Signed)
Subjective: Foot pain somewhat better this a.m.  For dialysis today off schedule due to emergent patient load yesterday  Objective Vital signs in last 24 hours: Vitals:   03/27/20 1818 03/27/20 2054 03/28/20 0259 03/28/20 0556  BP: (!) 142/68 131/64 (!) 143/83 (!) 141/73  Pulse: 78 74 80 78  Resp: 19 16 16 16   Temp: 99 F (37.2 C) 98.6 F (37 C) 99.2 F (37.3 C) 99.2 F (37.3 C)  TempSrc: Oral Oral Oral Oral  SpO2: 99% 100% 100% 100%   Weight change:   Physical Exam: General:  thin chronically ill-appearing female, NAD Heart: RRR no MRG Lungs: CTA except decreased in bases bilaterally, nonlabored breathing Abdomen: Bowel sounds normoactive, soft NT ND, no ascites Extremities: Trace pedal edema on right, and dorsum of right foot , R  Grt toe  Dry ulcer and MTP ulcer   left BKA stump no edema Dialysis Access: Positive bruit  LUA  AV F  Dialysis Orders: Center: [facility] on MWF. EDW 64 kg HD Bath 3K, 2.25 CA time 3 or 45 minutes Heparin none. Access LUA AVF     Hec 3 mcg IV/HD  Mircera 60 every 2 weeks last given ulcer 03/16/2020 Venofer 100 mg x 5 to start next treatment     Problem/Plan: 1. PVD /right great toe ulcer with pain= plan per admit / MRI negative for abscess, osteo on IV antibiotics , Zosyn, per admit 2. ESRD -HD MWF schedule ,  no HD yesterday due to emergent HD patient numbers HD tomorrow, K and volume okay, HD today 3. Hypokalemia=  admit K2.9 give p.o. supplement use added K bath, a.m. K3.4 4. Hypertension/volume  -admit BP mild hypertension secondary to pain versus med compliance, this a.m. stable BP volume okay 5. Anemia of ESRD-Hgb 8.3 <10.1 continue ESA , Aranesp would be due next on 3/26, hold IV iron with infection 6. Metabolic bone disease -corrected calcium 9.8 , phos 3.7 VELPHORO as binder/Hectorol on HD also Sensipar on dialysis 7. Diabetes mellitus type 2= per admit 8. CAD= meds per admit no current chest pain or shortness of breath 9. Nutrition -ALB  2.4 <3.1 ,diet renal carb modified renal vitamin, add Nepro supplement  Ernest Haber, PA-C [facility] [phone] 03/28/2020,10:27 AM  LOS: 0 days   Labs: Basic Metabolic Panel: Recent Labs  Lab 03/27/20 0235 03/28/20 0252  NA 135 133*  K 2.9* 3.4*  CL 96* 97*  CO2 26 26  GLUCOSE 161* 108*  BUN 46* 49*  CREATININE 4.07* 3.99*  CALCIUM 9.1 8.5*  PHOS  --  3.7   Liver Function Tests: Recent Labs  Lab 03/27/20 0235 03/28/20 0252  AST 20  --   ALT 26  --   ALKPHOS 81  --   BILITOT 0.8  --   PROT 6.8  --   ALBUMIN 3.1* 2.4*   No results for input(s): LIPASE, AMYLASE in the last 168 hours. No results for input(s): AMMONIA in the last 168 hours. CBC: Recent Labs  Lab 03/27/20 0235 03/28/20 0252  WBC 20.4* 12.6*  NEUTROABS  --  9.5*  HGB 10.1* 8.3*  HCT 32.3* 25.8*  MCV 91.5 89.3  PLT 269 179   Cardiac Enzymes: No results for input(s): CKTOTAL, CKMB, CKMBINDEX, TROPONINI in the last 168 hours. CBG: Recent Labs  Lab 03/27/20 1603 03/27/20 2053 03/28/20 0726  GLUCAP 112* 136* 112*    Medications: . piperacillin-tazobactam (ZOSYN)  IV 2.25 g (03/28/20 0559)   . aspirin  EC  81 mg Oral Daily  . atorvastatin  80 mg Oral Daily  . atropine  1 drop Left Eye Daily  . brimonidine  1 drop Both Eyes Q8H  . cinacalcet  30 mg Oral Q T,Th,Sa-HD  . clopidogrel  75 mg Oral Daily  . dorzolamide-timolol  1 drop Both Eyes BID  . doxercalciferol  3 mcg Intravenous Q T,Th,Sa-HD  . heparin  5,000 Units Subcutaneous Q8H  . insulin aspart  0-9 Units Subcutaneous TID WC  . latanoprost  1 drop Both Eyes QHS  . prednisoLONE acetate  1 drop Left Eye QID  . sucroferric oxyhydroxide  1,000 mg Oral TID with meals

## 2020-03-28 NOTE — Progress Notes (Signed)
Subjective:   Ms. Ostermiller states that she currently is not in any pain and pain in her right foot has overall improved although continues to bother her intermittently. She notes chronic pain at rest that improves when she dangles her legs off the bed. She denies any drainage from her wound. She has active nausea and coughed up a mild amount of phlegm, although denies SOB, new cough, fevers, chills, abdominal pain, other joint pain, swelling, or any other symptoms.   Objective:  Vital signs in last 24 hours: Vitals:   03/27/20 1818 03/27/20 2054 03/28/20 0259 03/28/20 0556  BP: (!) 142/68 131/64 (!) 143/83 (!) 141/73  Pulse: 78 74 80 78  Resp: 19 16 16 16   Temp: 99 F (37.2 C) 98.6 F (37 C) 99.2 F (37.3 C) 99.2 F (37.3 C)  TempSrc: Oral Oral Oral Oral  SpO2: 99% 100% 100% 100%   General: Patient appears tired but resting comfortably. NAD. HENT: MMM. No nasal discharge. Respiratory: Lungs are CTA, bilaterally. No wheezes, rales, or rhonchi.  Cardiovascular: Regular rate and rhythm. No murmurs, rubs, or gallops. No lower extremity edema. I am unable to palpate DP or PT pulses in RLE. MSK: Patient is s/p BKA of the LLE. There is moderate tenderness to palpation over the first MTP joint of the right foot with mild swelling.  Neurological: Strength is intact to light tough in RLE.  Abdominal: Active nausea/minimal vomiting. Soft and non-tender to palpation. Bowel sounds intact. No rebound or guarding. Skin: There is a ~1.5x1cm ulceration over the ball of the plantar foot without active drainage or significant erythema. There is a tiny, punctate lesion between the base of the right 2nd and 3rd toes. There is peeling of the right lower leg. Psych: Flat affect. Normal tone of voice.   Assessment/Plan:  Active Problems:   Right foot infection  # Cellulitis / Possible Osteomyelitis in setting of Diabetic Foot Wound of RLE  # Hx of Severe PAD of B/L LE's R foot MRI showed changes  of the proximal phalanx of the 3rd toe and 2nd metatarsal head as well as mild edema of the 2nd metatarsal of the right foot, concerning for possible osteomyelitis. There were no signs of infection of the 1st right metatarsal. She states her pain has improved significantly on IV Zosyn. She has a history of L BKA due to severe L-sided arterial disease and was noted to have severe RLE arterial disease on ABI 09/2019. I am unable to palpate distal pulses in the RLE and am concerned for severe arterial disease that may impair her ability to heal. Remains HDS and afebrile with great improvement in leukocytosis.  - Vascular surgery consulted  - Will repeat ABI's - Blood cultures pending  - Dilaudid and Tylenol PRN for pain  - Will give single dose of Zofran for nausea, although this is chronic  - Monitor daily CBC   ESRD on HD  Hypokalemia Patient endorses chronic nausea and vomiting, which may be in the setting of ESRD vs. Infection. She had left several HD sessions early over the past > 1 week. Completed HD today.  - Nephrology following, appreciate their recommendation  - Appreciate nephrology's assistance in electrolyte replacement  - Continue to monitor renal function  - Renal diet  Anemia of chronic disease Hemoglobin 10.1, near baseline.  Aranesp due on 3/26.  - Will hold iron in setting of active infection  - Daily CBC  Diabetes mellitus Patient has sitagliptin 25 mg daily and  glipizide 5 mg daily listed in her medication list.  Patient does not have these medications with the medications that she brought to the hospital. She states her glipizide has been discontinued. Hgb A1c low at 4.7 and she endorses recent hypoglycemia at home, likely in the setting of decreased insulin requirements 2/2 CKD.  - SSI-sensitive - Frequent CBGs  Prior to Admission Living Arrangement: Home w/ significant other Anticipated Discharge Location: Pending vascular / PT / OT assessments Barriers to  Discharge: IV Antibiotics, above  Jeralyn Bennett, MD 03/28/2020, 9:32 AM Pager: [phone] After 5pm on weekdays and 1pm on weekends: On Call pager [phone]

## 2020-03-29 ENCOUNTER — Inpatient Hospital Stay (HOSPITAL_COMMUNITY)
Admission: EM | Disposition: A | Discharge status: Home / Self Care | Payer: Self-pay | Source: Home / Self Care | Attending: Internal Medicine

## 2020-03-29 ENCOUNTER — Ambulatory Visit (HOSPITAL_COMMUNITY): Admission: RE | Admit: 2020-03-29 | Payer: Medicare Other | Source: Home / Self Care | Admitting: Vascular Surgery

## 2020-03-29 ENCOUNTER — Other Ambulatory Visit: Payer: Self-pay

## 2020-03-29 DIAGNOSIS — E11621 Type 2 diabetes mellitus with foot ulcer: Secondary | ICD-10-CM | POA: Diagnosis not present

## 2020-03-29 DIAGNOSIS — L089 Local infection of the skin and subcutaneous tissue, unspecified: Secondary | ICD-10-CM | POA: Diagnosis not present

## 2020-03-29 DIAGNOSIS — N186 End stage renal disease: Secondary | ICD-10-CM | POA: Diagnosis not present

## 2020-03-29 DIAGNOSIS — I739 Peripheral vascular disease, unspecified: Secondary | ICD-10-CM | POA: Diagnosis not present

## 2020-03-29 HISTORY — PX: PERIPHERAL VASCULAR INTERVENTION: CATH118257

## 2020-03-29 HISTORY — PX: PERIPHERAL VASCULAR BALLOON ANGIOPLASTY: CATH118281

## 2020-03-29 HISTORY — PX: ABDOMINAL AORTOGRAM W/LOWER EXTREMITY: CATH118223

## 2020-03-29 LAB — RENAL FUNCTION PANEL
Albumin: 2.5 g/dL — ABNORMAL LOW (ref 3.5–5.0)
Anion gap: 9 (ref 5–15)
BUN: 15 mg/dL (ref 8–23)
CO2: 28 mmol/L (ref 22–32)
Calcium: 8.6 mg/dL — ABNORMAL LOW (ref 8.9–10.3)
Chloride: 98 mmol/L (ref 98–111)
Creatinine, Ser: 2.32 mg/dL — ABNORMAL HIGH (ref 0.44–1.00)
GFR, Estimated: 23 mL/min — ABNORMAL LOW (ref >60)
Glucose, Bld: 96 mg/dL (ref 70–99)
Phosphorus: 2.4 mg/dL — ABNORMAL LOW (ref 2.5–4.6)
Potassium: 3.5 mmol/L (ref 3.5–5.1)
Sodium: 135 mmol/L (ref 135–145)

## 2020-03-29 LAB — CBC WITH DIFFERENTIAL/PLATELET
Abs Immature Granulocytes: 0.04 10*3/uL (ref 0.00–0.07)
Basophils Absolute: 0 10*3/uL (ref 0.0–0.1)
Basophils Relative: 0 %
Eosinophils Absolute: 0.1 10*3/uL (ref 0.0–0.5)
Eosinophils Relative: 1 %
HCT: 26.7 % — ABNORMAL LOW (ref 36.0–46.0)
Hemoglobin: 8.7 g/dL — ABNORMAL LOW (ref 12.0–15.0)
Immature Granulocytes: 0 %
Lymphocytes Relative: 16 %
Lymphs Abs: 1.7 10*3/uL (ref 0.7–4.0)
MCH: 28.9 pg (ref 26.0–34.0)
MCHC: 32.6 g/dL (ref 30.0–36.0)
MCV: 88.7 fL (ref 80.0–100.0)
Monocytes Absolute: 1 10*3/uL (ref 0.1–1.0)
Monocytes Relative: 9 %
Neutro Abs: 7.8 10*3/uL — ABNORMAL HIGH (ref 1.7–7.7)
Neutrophils Relative %: 74 %
Platelets: 177 10*3/uL (ref 150–400)
RBC: 3.01 MIL/uL — ABNORMAL LOW (ref 3.87–5.11)
RDW: 15.7 % — ABNORMAL HIGH (ref 11.5–15.5)
WBC: 10.6 10*3/uL — ABNORMAL HIGH (ref 4.0–10.5)
nRBC: 0 % (ref 0.0–0.2)

## 2020-03-29 LAB — GLUCOSE, CAPILLARY
Glucose-Capillary: 104 mg/dL — ABNORMAL HIGH (ref 70–99)
Glucose-Capillary: 102 mg/dL — ABNORMAL HIGH (ref 70–99)
Glucose-Capillary: 100 mg/dL — ABNORMAL HIGH (ref 70–99)
Glucose-Capillary: 96 mg/dL (ref 70–99)

## 2020-03-29 LAB — POCT ACTIVATED CLOTTING TIME
Activated Clotting Time: 249 seconds
Activated Clotting Time: 255 seconds
Activated Clotting Time: 142 seconds

## 2020-03-29 SURGERY — ABDOMINAL AORTOGRAM W/LOWER EXTREMITY
Anesthesia: LOCAL | Laterality: Right

## 2020-03-29 MED ORDER — HEPARIN SODIUM (PORCINE) 1000 UNIT/ML IJ SOLN
INTRAMUSCULAR | Status: AC
  Filled 2020-03-29: qty 1

## 2020-03-29 MED ORDER — MIDAZOLAM HCL 2 MG/2ML IJ SOLN
INTRAMUSCULAR | Status: AC
  Filled 2020-03-29: qty 2

## 2020-03-29 MED ORDER — LIDOCAINE HCL (PF) 1 % IJ SOLN
INTRAMUSCULAR | Status: DC | PRN
  Administered 2020-03-29: 20 mL via INTRADERMAL

## 2020-03-29 MED ORDER — PROTAMINE SULFATE 10 MG/ML IV SOLN
INTRAVENOUS | Status: DC | PRN
  Administered 2020-03-29: 5 mg via INTRAVENOUS
  Administered 2020-03-29: 45 mg via INTRAVENOUS

## 2020-03-29 MED ORDER — LIDOCAINE-PRILOCAINE 2.5-2.5 % EX CREA: 1.0000 "application " | TOPICAL_CREAM | CUTANEOUS | Status: DC | PRN

## 2020-03-29 MED ORDER — OXYCODONE HCL 5 MG PO TABS
5.0000 mg | ORAL_TABLET | ORAL | Status: DC | PRN
  Administered 2020-04-01: 10 mg via ORAL
  Administered 2020-04-01: 5 mg via ORAL
  Administered 2020-04-01 (×2): 10 mg via ORAL
  Filled 2020-03-29 (×2): qty 2
  Filled 2020-03-29: qty 1
  Filled 2020-03-29: qty 2

## 2020-03-29 MED ORDER — SODIUM CHLORIDE 0.9 % IV SOLN: 100.0000 mL | INTRAVENOUS | Status: DC | PRN

## 2020-03-29 MED ORDER — ONDANSETRON HCL 4 MG/2ML IJ SOLN
4.0000 mg | Freq: Four times a day (QID) | INTRAMUSCULAR | Status: DC | PRN
  Administered 2020-03-29 – 2020-03-30 (×2): 4 mg via INTRAVENOUS
  Filled 2020-03-29 (×2): qty 2

## 2020-03-29 MED ORDER — HYDRALAZINE HCL 20 MG/ML IJ SOLN
5.0000 mg | INTRAMUSCULAR | Status: DC | PRN
Start: 2020-03-29 — End: 2020-04-01

## 2020-03-29 MED ORDER — MIDAZOLAM HCL 2 MG/2ML IJ SOLN
INTRAMUSCULAR | Status: DC | PRN
  Administered 2020-03-29: 2 mg via INTRAVENOUS
  Administered 2020-03-29: 1 mg via INTRAVENOUS

## 2020-03-29 MED ORDER — ALTEPLASE 2 MG IJ SOLR: 2.0000 mg | Freq: Once | INTRAMUSCULAR | Status: DC | PRN

## 2020-03-29 MED ORDER — IODIXANOL 320 MG/ML IV SOLN
INTRAVENOUS | Status: DC | PRN
  Administered 2020-03-29: 160 mL

## 2020-03-29 MED ORDER — PROTAMINE SULFATE 10 MG/ML IV SOLN
INTRAVENOUS | Status: AC
  Filled 2020-03-29: qty 5

## 2020-03-29 MED ORDER — HEPARIN SODIUM (PORCINE) 1000 UNIT/ML DIALYSIS: 1000.0000 [IU] | INTRAMUSCULAR | Status: DC | PRN

## 2020-03-29 MED ORDER — HYDROMORPHONE HCL 1 MG/ML IJ SOLN
0.5000 mg | INTRAMUSCULAR | Status: DC | PRN
  Administered 2020-03-29: 0.5 mg via INTRAVENOUS
  Administered 2020-03-29 – 2020-03-31 (×10): 1 mg via INTRAVENOUS
  Filled 2020-03-29 (×12): qty 1

## 2020-03-29 MED ORDER — HYDROMORPHONE HCL 1 MG/ML IJ SOLN
INTRAMUSCULAR | Status: AC
  Filled 2020-03-29: qty 0.5

## 2020-03-29 MED ORDER — PENTAFLUOROPROP-TETRAFLUOROETH EX AERO: 1.0000 "application " | INHALATION_SPRAY | CUTANEOUS | Status: DC | PRN

## 2020-03-29 MED ORDER — LIDOCAINE HCL (PF) 1 % IJ SOLN: 5.0000 mL | INTRAMUSCULAR | Status: DC | PRN

## 2020-03-29 MED ORDER — LABETALOL HCL 5 MG/ML IV SOLN
10.0000 mg | INTRAVENOUS | Status: DC | PRN
  Administered 2020-03-29: 10 mg via INTRAVENOUS

## 2020-03-29 MED ORDER — FENTANYL CITRATE (PF) 100 MCG/2ML IJ SOLN
INTRAMUSCULAR | Status: AC
  Filled 2020-03-29: qty 2

## 2020-03-29 MED ORDER — HEPARIN (PORCINE) IN NACL 1000-0.9 UT/500ML-% IV SOLN
INTRAVENOUS | Status: DC | PRN
  Administered 2020-03-29 (×2): 500 mL

## 2020-03-29 MED ORDER — CHLORHEXIDINE GLUCONATE CLOTH 2 % EX PADS
6.0000 | MEDICATED_PAD | Freq: Every day | CUTANEOUS | Status: DC
  Administered 2020-03-29 – 2020-04-01 (×4): 6 via TOPICAL

## 2020-03-29 MED ORDER — HYDROMORPHONE HCL 1 MG/ML IJ SOLN
1.0000 mg | Freq: Once | INTRAMUSCULAR | Status: AC
  Administered 2020-03-29: 1 mg via INTRAVENOUS
  Filled 2020-03-29: qty 1

## 2020-03-29 MED ORDER — FENTANYL CITRATE (PF) 100 MCG/2ML IJ SOLN
INTRAMUSCULAR | Status: DC | PRN
  Administered 2020-03-29: 50 ug via INTRAVENOUS
  Administered 2020-03-29 (×5): 25 ug via INTRAVENOUS

## 2020-03-29 MED ORDER — LIDOCAINE HCL (PF) 1 % IJ SOLN
INTRAMUSCULAR | Status: AC
  Filled 2020-03-29: qty 30

## 2020-03-29 MED ORDER — HEPARIN SODIUM (PORCINE) 1000 UNIT/ML IJ SOLN
INTRAMUSCULAR | Status: DC | PRN
  Administered 2020-03-29: 7000 [IU] via INTRAVENOUS

## 2020-03-29 MED ORDER — HYDROMORPHONE HCL 1 MG/ML IJ SOLN
INTRAMUSCULAR | Status: DC | PRN
  Administered 2020-03-29: 0.5 mg via INTRAVENOUS

## 2020-03-29 MED ORDER — HEPARIN (PORCINE) IN NACL 1000-0.9 UT/500ML-% IV SOLN
INTRAVENOUS | Status: AC
  Filled 2020-03-29: qty 1000

## 2020-03-29 SURGICAL SUPPLY — 29 items
BALLN JADE .018 5.0 X 240 (BALLOONS) ×3
BALLN MUSTANG 5X150X135 (BALLOONS) ×3
BALLN MUSTANG 5X200X135 (BALLOONS) ×3
BALLN STERLING OTW 2X60X150 (BALLOONS) ×3
BALLOON JADE .018 5.0 X 240 (BALLOONS) IMPLANT
BALLOON MUSTANG 5X150X135 (BALLOONS) IMPLANT
BALLOON MUSTANG 5X200X135 (BALLOONS) IMPLANT
BALLOON STERLING OTW 2X60X150 (BALLOONS) IMPLANT
CATH ANGIO 5F PIGTAIL 65CM (CATHETERS) ×1 IMPLANT
CATH CROSS OVER TEMPO 5F (CATHETERS) ×1 IMPLANT
CATH QUICKCROSS .035X135CM (MICROCATHETER) ×1 IMPLANT
CATH STRAIGHT 5FR 65CM (CATHETERS) ×1 IMPLANT
DEVICE CONTINUOUS FLUSH (MISCELLANEOUS) ×1 IMPLANT
DEVICE TORQUE H2O (MISCELLANEOUS) ×1 IMPLANT
GUIDEWIRE ANGLED .035X260CM (WIRE) ×1 IMPLANT
KIT ENCORE 40 (KITS) ×1 IMPLANT
KIT MICROPUNCTURE NIT STIFF (SHEATH) ×1 IMPLANT
KIT PV (KITS) ×3 IMPLANT
SHEATH PINNACLE 5F 10CM (SHEATH) ×1 IMPLANT
SHEATH PINNACLE ST 7F 45CM (SHEATH) ×1 IMPLANT
SHEATH PROBE COVER 6X72 (BAG) ×1 IMPLANT
STENT INNOVA 5X120X130 (Permanent Stent) ×1 IMPLANT
STENT INNOVA 5X150X130 (Permanent Stent) ×2 IMPLANT
SYR MEDRAD MARK V 150ML (SYRINGE) ×1 IMPLANT
TRANSDUCER W/STOPCOCK (MISCELLANEOUS) ×3 IMPLANT
TRAY PV CATH (CUSTOM PROCEDURE TRAY) ×3 IMPLANT
WIRE BENTSON .035X145CM (WIRE) ×1 IMPLANT
WIRE G V18X300CM (WIRE) ×1 IMPLANT
WIRE ROSEN-J .035X260CM (WIRE) ×1 IMPLANT

## 2020-03-29 NOTE — Progress Notes (Signed)
Subjective:  Had dialysis off scheduled yesterday.  Underwent angiogram with angioplasty and stents to R LE this am   Seen in room -tired but no complaints.    Objective Vital signs in last 24 hours: Vitals:   03/29/20 1405 03/29/20 1410 03/29/20 1415 03/29/20 1420  BP: (!) 142/51 (!) 148/66 (!) 150/56 (!) 148/72  Pulse: 73 73 73 73  Resp: 16 16 17 15   Temp:      TempSrc:      SpO2: 99% 100% 100% 100%  Weight:       Weight change:   Physical Exam: General: chronically ill-appearing female, nad  Heart: RRR no MRG Lungs: CTA except decreased in bases bilaterally, nonlabored breathing Abdomen: Soft non-tender. 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 Access LUA AVF     Hec 3 mcg IV/HD  Mircera 60 every 2 weeks last given 03/16/2020 Venofer 100 mg x 5 to start next treatment     Assessment/Plan: 1. PAD /Non healing R foot wound --IV antibiotics started -per primary.  Noted to have severe RLE arterial disease. S/p aortogram with angioplasty and stenting to right superficial femoral-popliteal and tibial vessels -per VVS.  2. ESRD -HD MWF schedule. HD off schedule 3/24. Short HD today 3/25 to get back on schedule  3. Hypokalemia -- K+ 3.5  Supplement prn. Added K+ bath on dialysis  4. Hypertension/volume  - BP elevated on admission. Not to EDW yet. UF to dry as able.   5. Anemia of ESRD-Hgb 8.7.  Continue ESA - Give Aranesp 60 with HD 3/25. Hold IV iron with infection 6. Metabolic bone disease -corrected calcium 9.8 , phos 3.7 VELPHORO as binder/Hectorol on HD also Sensipar on dialysis 7. Diabetes mellitus Type 2  8. CAD  Lynnda Child PA-C [facility] 03/29/2020,2:46 PM    Labs: Basic Metabolic Panel: Recent Labs  Lab 03/27/20 0235 03/28/20 0252 03/29/20 0121  NA 135 133* 135  K 2.9* 3.4*  3.5  CL 96* 97* 98  CO2 26 26 28   GLUCOSE 161* 108* 96  BUN 46* 49* 15  CREATININE 4.07* 3.99* 2.32*  CALCIUM 9.1 8.5* 8.6*  PHOS  --  3.7 2.4*   Liver Function Tests: Recent Labs  Lab 03/27/20 0235 03/28/20 0252 03/29/20 0121  AST 20  --   --   ALT 26  --   --   ALKPHOS 81  --   --   BILITOT 0.8  --   --   PROT 6.8  --   --   ALBUMIN 3.1* 2.4* 2.5*   No results for input(s): LIPASE, AMYLASE in the last 168 hours. No results for input(s): AMMONIA in the last 168 hours. CBC: Recent Labs  Lab 03/27/20 0235 03/28/20 0252 03/29/20 0121  WBC 20.4* 12.6* 10.6*  NEUTROABS  --  9.5* 7.8*  HGB 10.1* 8.3* 8.7*  HCT 32.3* 25.8* 26.7*  MCV 91.5 89.3 88.7  PLT 269 179 177   Cardiac Enzymes: No results for input(s): CKTOTAL, CKMB, CKMBINDEX, TROPONINI in the last 168 hours. CBG: Recent Labs  Lab 03/28/20 0726 03/28/20 1135 03/28/20 1753 03/28/20 2126 03/29/20 0716  GLUCAP 112* 120* 69* 100* 104*    Medications: . piperacillin-tazobactam (ZOSYN)  IV 2.25 g (03/29/20 0518)   . aspirin EC  81  mg Oral Daily  . atorvastatin  80 mg Oral Daily  . atropine  1 drop Left Eye Daily  . brimonidine  1 drop Both Eyes Q8H  . Chlorhexidine Gluconate Cloth  6 each Topical Q0600  . cinacalcet  30 mg Oral Q T,Th,Sa-HD  . clopidogrel  75 mg Oral Daily  . dorzolamide-timolol  1 drop Both Eyes BID  . doxercalciferol  3 mcg Intravenous Q T,Th,Sa-HD  . feeding supplement (NEPRO CARB STEADY)  237 mL Oral BID BM  . heparin  5,000 Units Subcutaneous Q8H  . latanoprost  1 drop Both Eyes QHS  . polyethylene glycol  17 g Oral Daily  . prednisoLONE acetate  1 drop Left Eye QID  . sucroferric oxyhydroxide  1,000 mg Oral TID with meals

## 2020-03-29 NOTE — Interval H&P Note (Signed)
History and Physical Interval Note:  03/29/2020 9:41 AM  Rebekah Johnson Aller  has presented today for surgery, with the diagnosis of pad.  The various methods of treatment have been discussed with the patient and family. After consideration of risks, benefits and other options for treatment, the patient has consented to  Procedure(s): ABDOMINAL AORTOGRAM W/LOWER EXTREMITY (N/A) as a surgical intervention.  The patient's history has been reviewed, patient examined, no change in status, stable for surgery.  I have reviewed the patient's chart and labs.  Questions were answered to the patient's satisfaction.     Ruta Hinds

## 2020-03-29 NOTE — Progress Notes (Signed)
OT Cancellation Note  Patient Details Name: Rebekah Johnson MRN: [id number] DOB: 05-06-1960   Cancelled Treatment:    Reason Eval/Treat Not Completed: Patient at procedure or test/ unavailable (cath lab).  [name] 03/29/2020, 10:11 AM  [name], OTR/L Acute Rehabilitation Services Pager: [phone] Office: [phone]

## 2020-03-29 NOTE — Progress Notes (Signed)
Received pt from HD. VSS. Pt c/o 10/10 R foot pain and naseua- PRNs given. MD at bedside to assess. Needs met and call bell within reach. Will continue to monitor.  

## 2020-03-29 NOTE — Op Note (Signed)
Procedure: Abdominal aortogram with right lower extremity runoff, angioplasty and stenting of right superficial and popliteal artery (5 x 150, 5 x 150, 5 x 120 self-expanding stent), angioplasty right tibioperoneal trunk and posterior tibial artery (2 x 60)  Preoperative diagnosis: Nonhealing wound right foot  Postoperative diagnosis: Same  Anesthesia: Local with IV sedation, Versed 3 mg, fentanyl 175 mcg  Sedation time: 148 minutes  Contrast: 160 cc  Operative findings: 1.  Severe calcific atherosclerosis right lower extremity involving the entire right superficial femoral-popliteal and tibial vessels multiple subtotal occlusions diffusely throughout the right superficial and popliteal artery stented to 0% residual stenosis.  2.  Runoff via peroneal and posterior tibial artery with reconstitution of the anterior tibial artery via collaterals  Operative details: After obtaining informed consent, the patient taken the PV lab.  The patient was placed in supine position on the angio table.  Both groins were prepped and draped in usual sterile fashion.  Local anesthesia was infiltrated of the left common femoral artery.  This was inspected under ultrasound and found to be heavily calcified.  I initially used an introducer needle and was able to cannulate the artery but could not get the guidewire to advance.  The needle was removed and hemostasis obtained with direct pressure for 5 minutes.  I then attempted to use a micropuncture needle but this would not penetrate the calcium easily.  I went back to the standard introducer needle and cannulated slightly higher with some difficulty I was able to finally catheterize the artery despite the calcification.  I was then able to advance 035 Bentson wire up in the abdominal aorta under fluoroscopic guidance.  Next a 5 French sheath was placed over the guidewire in the left common femoral artery.  This was thoroughly flushed with heparinized saline.  A 5 French  pigtail catheter was advanced up into the abdominal aorta under fluoroscopic guidance.  Abdominal aortogram was obtained in AP projection.  Left and right renal arteries are small but patent.  The infrarenal abdominal aorta is patent.  The left and right common external and internal iliac arteries are calcified but patent.  Next right lower extremity runoff views were obtained through the pigtail catheter.  The right common femoral artery is patent.  The right profunda femoris is patent.  The right superficial femoral artery is patent but multiple segments of greater than 80% stenosis and short segments of subtotal occlusion.  The popliteal artery above the knee is similarly diseased.  The below-knee popliteal artery is small but patent.  The anterior tibial artery is occluded at its origin.  The tibioperoneal trunk is diseased but there is a patent posterior tibial and peroneal artery.  At this time I did intervene on the right leg areas of disease.  The 5 French pigtail catheter was removed over guidewire and swapped out for a 5 [place] crossover catheter.  I then advanced a guidewire into the mid right superficial femoral artery.  5 French sheath was swapped out for a 7 [place] destination sheath which was advanced up and over the aortic bifurcation and down into the mid right common femoral artery.  An 035 angled Glidewire was then advanced across the right leg lesions after administering 6000 units of heparin.  We gave the patient an additional 5000 units of heparin during the course of the case.  The guidewire was advanced all the way down in the tibioperoneal trunk.  A 035 quick cross was then placed over this and swapped out for  a 035 Rosen wire.  I then attempted to place a 5-50 angioplasty balloon but could only get this to advance to the level of the adductor hiatus.  I did 2 overlapping inflations with [place] low back to the origin of the SFA.  There was still a lot of resistance on the wire secondary  to multiple stenoses and occlusions.  In efforts to try to advance the balloon the wire pulled back into the mid SFA.  At this point we readvanced the [place] were able to get back down the tibioperoneal trunk.  I tried this 1 more time but again the wire pulled back.  We then converted over to a 018 system with a V 18 wire.  It was advanced this all the way down to the tibioperoneal trunk and then get a stable platform.  Contrast angiogram was performed which showed that the tibioperoneal trunk was now not opacified with contrast.  Therefore a 2 x 60 jade angioplasty balloon was brought up and advanced and centered from the proximal posterior tibial artery all the way back into the tibioperoneal trunk.  This was inflated to nominal pressure and completion angiogram of the segment showed restored patency of the tibioperoneal trunk and posterior tibial artery with peroneal and posterior tibial runoff.  There was some blush of contrast suggesting a perforation in the distal popliteal artery but there was minimal extravasation.  My attention was then turned to the remainder of the popliteal and superficial femoral artery and we ballooned this with a 5 balloon all the way up to the superficial femoral artery origin.  Contrast angiogram had showed multiple areas of dissection.  The patient was becoming fairly restless at this point.  I decided to go ahead and stent all of this area since she did not really give [place] much opportunity to fix focal defined lesions.  We then stented her with Inova stents a 5 x 120 stacked on another 5 x 120 with a 5 x 120.  The stent extended from the knee joint all the way to the SFA origin about 2 cm after the takeoff of this.  You the patient's motion on the table I did not feel safe coming all the way back to the SFA origin because I did not want to compromise the profunda.  At this point completion angiogram was performed which showed wide patency of the SFA and popliteal artery  with brisk runoff through the tibioperoneal trunk posterior tibial and peroneal arteries all the way to level foot.  There is also pretty good flow at this point through the anterior tibial artery although the origin was occluded.  It was filling from collaterals.  There was still a small blush of contrast in the popliteal artery and ACT was still 250.  At this point we administered 50 mg of protamine to help achieve hemostasis in the popliteal artery.  The patient is already on Plavix and aspirin and these will be continued for lifetime.  The sheath was pulled back down in the left hemipelvis and left in place to be pulled after the ACT is less than 175.  Although the patient was fairly agitated during the course of the procedure there were no complications.  Patient was taken to the holding area in stable condition.  Operative management: Patient now has inline flow to the right foot.  She needs a lifetime of Plavix and aspirin unless she has any future bleeding episodes and we could reconsider whether or not to  stop this.  From a vascular surgery standpoint she most likely can go home tomorrow and then we will have her seen back in follow-up to recheck the wound on her foot.  Ruta Hinds, MD [facility] Office: [phone]

## 2020-03-29 NOTE — Progress Notes (Signed)
Left femoral artery sheath removed and pressure held for 20 minutes. Post instructions given. Downtime for 4 hours begins at 1430 PM. Left distal pulses unattainable due to left BKA. Left groin is level zero. Patients states understanding of instructions.

## 2020-03-29 NOTE — Progress Notes (Addendum)
Subjective:   Rebekah Johnson is resting in bed comfortably. She states she is constipated and would like a laxative. She states she had one in the past that worked well. She endorses speaking with vascular surgery yesterday and the plan today is to get imaging of her lower extremity vessels and intervene if need be. Discussed with her her infection is improving and that we will be in touch with infectious disease regarding her ABX treatment.   Objective:  Vital signs in last 24 hours: Vitals:   03/28/20 1640 03/28/20 1756 03/29/20 0500 03/29/20 0630  BP: 108/79 (!) 156/79  (!) 172/77  Pulse: 63 79  80  Resp: 18 15  16   Temp: 98.9 F (37.2 C) 98.6 F (37 C)  98.6 F (37 C)  TempSrc: Oral   Oral  SpO2: 98% 100%  100%  Weight: 66 kg  65.7 kg    General: Patient appears tired but resting comfortably. NAD. HENT: MMM. Inflamed conjunctiva bilaterally. No erythema or drainage. Respiratory: Some active cough although lungs are CTA, bilaterally. No wheezes, rales, or rhonchi.  Cardiovascular: Regular rate and rhythm. No murmurs, rubs, or gallops. No lower extremity edema. I am unable to palpate DP or PT pulses in RLE although RLE feels warm. MSK: Patient is s/p BKA of the LLE. She has active pain over the 1st medial MTP joint of the LLE. Neurological: Alert and oriented. Abdominal: Bowel sounds intact. Soft and non-distended with minimal tenderness to palpation of the LLQ. Skin: There is a ~1.5x1cm ulceration over the ball of the plantar foot without active drainage or significant erythema. There is a tiny, punctate lesion between the base of the right 2nd and 3rd toes.  Psych: Normal affect. Normal tone of voice.   Assessment/Plan:  Active Problems:   Right foot infection  # Cellulitis / Possible Osteomyelitis in setting of Diabetic Foot Wound of RLE  # Hx of Severe PAD of B/L LE's R foot MRI showed changes of the proximal phalanx of the 3rd toe and 2nd metatarsal head as well as  mild edema of the 2nd metatarsal of the right foot, concerning for possible osteomyelitis. Her leukocytosis continues to improve with IV Zosyn and she remains afebrile, although vascular surgery recommend aortogram with RLE arteriogram. She is more accepting of this today with hopes of keeping her leg. She did complain of severe intermittent pain of her 1st metatarsal, likely due to severe PAD.  - She is currently in the cath lab - May require vascular intervention  - Plan to continue IV Zosyn and consult surgery if there are significant lesions unable to be intervened upon - Blood cultures show no growth x 2 days  - Continue Dilaudid 2mg  PO q2 hours PRN for now with additional dose this morning  - Will need to reassess pain following procedure this morning  - Monitor daily CBC   Constipation  Chronic Nausea/Vomiting Patient notes her last bowel movement was about 1 week ago. She continues to pass gas and bowel sounds are intact. Miralax was insufficient. Also consider infection vs. ESRD as contributors to nausea / vomiting. - Will try Lactulose 8mL today and uptitrate as needed - Low dose Zofran if needed   ESRD on HD  Hypokalemia, Resolved Hypophosphatemia  Patient completed HD yesterday.  - Nephrology following, appreciate their recommendation  - Will need HD after aortogram - Appreciate nephrology's assistance in electrolyte replacement  - Continue to monitor renal function  - Renal diet  Anemia of chronic  disease Hemoglobin dropped following hospital HD session although remains stable today.  - Will hold iron in setting of active infection  - Continue daily CBC  Diabetes mellitus Patient has sitagliptin 25 mg daily and glipizide 5 mg daily listed in her medication list. Patient does not have these medications with the medications that she brought to the hospital. She states her glipizide has been discontinued. Hgb A1c low at 4.7 and she did have a mild hypoglycemic CBG  yesterday while NPO. Blood sugars stabilized today.  - Resume carb-modified/renal diet w/ fluid restriction after cath - SSI-sensitive - Frequent CBGs  Prior to Admission Living Arrangement: Home w/ significant other Anticipated Discharge Location: Pending vascular / PT / OT assessments Barriers to Discharge: IV Antibiotics / Vascular Evaluation  Jeralyn Bennett, MD 03/29/2020, 7:32 AM Pager: [phone] After 5pm on weekdays and 1pm on weekends: On Call pager [phone]

## 2020-03-29 NOTE — Progress Notes (Signed)
PT Cancellation Note  Patient Details Name: Rebekah Johnson MRN: [id number] DOB: 11/20/1960   Cancelled Treatment:    Reason Eval/Treat Not Completed: Patient at procedure or test/unavailable. In cath lab, will retry as time and pt allow.   Ramond Dial 03/29/2020, 10:51 AM Mee Hives, PT [facility] Acute Rehab Dept. Number: [place] and [place]

## 2020-03-29 NOTE — Progress Notes (Signed)
PT Cancellation Note  Patient Details Name: Rebekah Johnson MRN: [id number] DOB: September 23, 1960   Cancelled Treatment:    Reason Eval/Treat Not Completed: Patient at procedure or test/unavailable;Medical issues which prohibited therapy.  Has just completed her procedure with a four hour bedrest.  Follow up as time and pt allow.   Ramond Dial 03/29/2020, 1:49 PM   Mee Hives, PT [facility] Acute Rehab Dept. Number: [place] and [place]

## 2020-03-30 DIAGNOSIS — N186 End stage renal disease: Secondary | ICD-10-CM | POA: Diagnosis not present

## 2020-03-30 DIAGNOSIS — L089 Local infection of the skin and subcutaneous tissue, unspecified: Secondary | ICD-10-CM

## 2020-03-30 DIAGNOSIS — I739 Peripheral vascular disease, unspecified: Secondary | ICD-10-CM | POA: Diagnosis not present

## 2020-03-30 LAB — CBC
HCT: 23.2 % — ABNORMAL LOW (ref 36.0–46.0)
HCT: 23 % — ABNORMAL LOW (ref 36.0–46.0)
Hemoglobin: 7.3 g/dL — ABNORMAL LOW (ref 12.0–15.0)
Hemoglobin: 7.4 g/dL — ABNORMAL LOW (ref 12.0–15.0)
MCH: 28.5 pg (ref 26.0–34.0)
MCH: 28.8 pg (ref 26.0–34.0)
MCHC: 31.5 g/dL (ref 30.0–36.0)
MCHC: 32.2 g/dL (ref 30.0–36.0)
MCV: 90.6 fL (ref 80.0–100.0)
MCV: 89.5 fL (ref 80.0–100.0)
Platelets: 172 10*3/uL (ref 150–400)
Platelets: 194 10*3/uL (ref 150–400)
RBC: 2.56 MIL/uL — ABNORMAL LOW (ref 3.87–5.11)
RBC: 2.57 MIL/uL — ABNORMAL LOW (ref 3.87–5.11)
RDW: 15.6 % — ABNORMAL HIGH (ref 11.5–15.5)
RDW: 15.5 % (ref 11.5–15.5)
WBC: 14.4 10*3/uL — ABNORMAL HIGH (ref 4.0–10.5)
WBC: 14.4 10*3/uL — ABNORMAL HIGH (ref 4.0–10.5)
nRBC: 0 % (ref 0.0–0.2)
nRBC: 0 % (ref 0.0–0.2)

## 2020-03-30 LAB — RENAL FUNCTION PANEL
Albumin: 2.3 g/dL — ABNORMAL LOW (ref 3.5–5.0)
Anion gap: 12 (ref 5–15)
BUN: 13 mg/dL (ref 8–23)
CO2: 26 mmol/L (ref 22–32)
Calcium: 8.2 mg/dL — ABNORMAL LOW (ref 8.9–10.3)
Chloride: 96 mmol/L — ABNORMAL LOW (ref 98–111)
Creatinine, Ser: 2.35 mg/dL — ABNORMAL HIGH (ref 0.44–1.00)
GFR, Estimated: 23 mL/min — ABNORMAL LOW (ref >60)
Glucose, Bld: 147 mg/dL — ABNORMAL HIGH (ref 70–99)
Phosphorus: 3.6 mg/dL (ref 2.5–4.6)
Potassium: 3.5 mmol/L (ref 3.5–5.1)
Sodium: 134 mmol/L — ABNORMAL LOW (ref 135–145)

## 2020-03-30 LAB — GLUCOSE, CAPILLARY
Glucose-Capillary: 146 mg/dL — ABNORMAL HIGH (ref 70–99)
Glucose-Capillary: 157 mg/dL — ABNORMAL HIGH (ref 70–99)
Glucose-Capillary: 141 mg/dL — ABNORMAL HIGH (ref 70–99)
Glucose-Capillary: 129 mg/dL — ABNORMAL HIGH (ref 70–99)

## 2020-03-30 LAB — PREPARE RBC (CROSSMATCH)

## 2020-03-30 LAB — HEMOGLOBIN AND HEMATOCRIT, BLOOD
HCT: 24.2 % — ABNORMAL LOW (ref 36.0–46.0)
Hemoglobin: 8 g/dL — ABNORMAL LOW (ref 12.0–15.0)

## 2020-03-30 LAB — OCCULT BLOOD X 1 CARD TO LAB, STOOL: Fecal Occult Bld: NEGATIVE

## 2020-03-30 LAB — PROTIME-INR
INR: 1.1 (ref 0.8–1.2)
Prothrombin Time: 14 seconds (ref 11.4–15.2)

## 2020-03-30 LAB — ABO/RH: ABO/RH(D): O POS

## 2020-03-30 LAB — MAGNESIUM: Magnesium: 2 mg/dL (ref 1.7–2.4)

## 2020-03-30 MED ORDER — PANTOPRAZOLE SODIUM 40 MG PO TBEC
40.0000 mg | DELAYED_RELEASE_TABLET | Freq: Every day | ORAL | Status: DC
  Administered 2020-03-30 – 2020-03-31 (×2): 40 mg via ORAL
  Filled 2020-03-30 (×2): qty 1

## 2020-03-30 MED ORDER — CLOPIDOGREL BISULFATE 75 MG PO TABS
75.0000 mg | ORAL_TABLET | Freq: Every day | ORAL | Status: DC
  Administered 2020-03-30: 75 mg via ORAL
  Filled 2020-03-30: qty 1

## 2020-03-30 MED ORDER — VANCOMYCIN HCL 1500 MG/300ML IV SOLN
1500.0000 mg | Freq: Once | INTRAVENOUS | Status: AC
  Administered 2020-03-30: 1500 mg via INTRAVENOUS
  Filled 2020-03-30: qty 300

## 2020-03-30 MED ORDER — SODIUM CHLORIDE 0.9% IV SOLUTION: Freq: Once | INTRAVENOUS | Status: AC

## 2020-03-30 MED ORDER — SODIUM CHLORIDE 0.9 % IV SOLN
2.0000 g | Freq: Once | INTRAVENOUS | Status: AC
  Administered 2020-03-30: 2 g via INTRAVENOUS
  Filled 2020-03-30 (×2): qty 2

## 2020-03-30 MED ORDER — HEPARIN SODIUM (PORCINE) 5000 UNIT/ML IJ SOLN
5000.0000 [IU] | Freq: Three times a day (TID) | INTRAMUSCULAR | Status: DC
  Administered 2020-03-30 – 2020-03-31 (×3): 5000 [IU] via SUBCUTANEOUS
  Filled 2020-03-30 (×3): qty 1

## 2020-03-30 MED ORDER — VANCOMYCIN HCL IN DEXTROSE 750-5 MG/150ML-% IV SOLN
750.0000 mg | INTRAVENOUS | Status: DC
  Filled 2020-03-30: qty 150

## 2020-03-30 MED ORDER — PANTOPRAZOLE SODIUM 40 MG IV SOLR: 40.0000 mg | Freq: Two times a day (BID) | INTRAVENOUS | Status: DC

## 2020-03-30 MED ORDER — CINACALCET HCL 30 MG PO TABS: 30.0000 mg | ORAL_TABLET | ORAL | Status: DC

## 2020-03-30 MED ORDER — ASPIRIN EC 81 MG PO TBEC
81.0000 mg | DELAYED_RELEASE_TABLET | Freq: Every day | ORAL | Status: DC
  Administered 2020-03-30: 81 mg via ORAL
  Filled 2020-03-30: qty 1

## 2020-03-30 MED ORDER — SODIUM CHLORIDE 0.9 % IV SOLN
2.0000 g | INTRAVENOUS | Status: DC
  Filled 2020-03-30 (×2): qty 2

## 2020-03-30 MED ORDER — DOXERCALCIFEROL 4 MCG/2ML IV SOLN
3.0000 ug | INTRAVENOUS | Status: DC
  Filled 2020-03-30: qty 2

## 2020-03-30 NOTE — Progress Notes (Signed)
Assisted pt to commode for large BM. After getting back in bed, pt suddenly nauseous and vomited approx 746mLs of bile/rown liquid emesis. Pt felt better afterwards and was given PRNs/fluids. Will continue to monitor.  Jaymes Graff, RN

## 2020-03-30 NOTE — Progress Notes (Signed)
Subjective:  Underwent angiogram with angioplasties and stents to R LE yesterday am.  Seen in room -tired but no complaints.    Objective Vital signs in last 24 hours: Vitals:   03/30/20 0329 03/30/20 0740 03/30/20 0945 03/30/20 1056  BP: (!) 119/57 (!) 98/55    Pulse: 84 75 74   Resp: 18 20 14    Temp: 97.7 F (36.5 C) 98.7 F (37.1 C)    TempSrc: Oral Oral    SpO2: 100% 100% 100% 100%  Weight: 72.7 kg     Height:       Weight change: -1.1 kg  Physical Exam: General: chronically ill-appearing female, nad  Heart: RRR no MRG Lungs: CTA except decreased in bases bilaterally, nonlabored breathing Abdomen: Soft non-tender. 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 PAD /Non healing R foot wound --IV antibiotics started per primary.  Noted to have severe RLE arterial disease. S/p aortogram with angioplasty and stenting to right superficial femoral-popliteal and tibial vessels on 3/25. Possible dc tomorrow.  2. ESRD -HD MWF schedule. Next HD Monday.  3. Hypokalemia -- K+ 3.5  Supplement prn.  4. Hypertension/volume  - BP elevated on admission. Wt's not accurate. Vol stable on exam 5. Anemia of ESRD-Hgb 8.7.  Continue ESA - Give Aranesp 60 with HD 3/25. Hold IV iron with infection 6. Metabolic bone disease -corrected calcium 9.8 , phos 3.7 VELPHORO as binder/Hectorol on HD also Sensipar on dialysis 7. Diabetes mellitus Type 2  8. CAD  Kelly Splinter, MD 03/30/2020, 11:30 AM    Labs: Basic Metabolic Panel: Recent Labs  Lab 03/28/20 0252 03/29/20 0121 03/30/20 0141  NA 133* 135 134*  K 3.4* 3.5 3.5  CL 97* 98 96*  CO2 26 28 26   GLUCOSE 108* 96 147*  BUN 49* 15 13  CREATININE 3.99* 2.32* 2.35*  CALCIUM 8.5* 8.6* 8.2*  PHOS 3.7 2.4* 3.6   Liver  Function Tests: Recent Labs  Lab 03/27/20 0235 03/28/20 0252 03/29/20 0121 03/30/20 0141  AST 20  --   --   --   ALT 26  --   --   --   ALKPHOS 81  --   --   --   BILITOT 0.8  --   --   --   PROT 6.8  --   --   --   ALBUMIN 3.1* 2.4* 2.5* 2.3*   No results for input(s): LIPASE, AMYLASE in the last 168 hours. No results for input(s): AMMONIA in the last 168 hours. CBC: Recent Labs  Lab 03/27/20 0235 03/28/20 0252 03/29/20 0121 03/30/20 0141 03/30/20 0940  WBC 20.4* 12.6* 10.6* 14.4* 14.4*  NEUTROABS  --  9.5* 7.8*  --   --   HGB 10.1* 8.3* 8.7* 7.3* 7.4*  HCT 32.3* 25.8* 26.7* 23.2* 23.0*  MCV 91.5 89.3 88.7 90.6 89.5  PLT 269 179 177 172 194   Cardiac Enzymes: No results for input(s): CKTOTAL, CKMB, CKMBINDEX, TROPONINI in the last 168 hours. CBG: Recent Labs  Lab 03/29/20 0716 03/29/20 1455 03/29/20 1601 03/29/20 2220 03/30/20 0614  GLUCAP 104* 102* 100* 96 146*    Medications: . piperacillin-tazobactam (ZOSYN)  IV 2.25 g (03/30/20 1055)   . sodium chloride   Intravenous Once  . aspirin EC  81 mg Oral Daily  . atorvastatin  80 mg Oral Daily  . atropine  1 drop Left Eye Daily  . brimonidine  1 drop Both Eyes Q8H  . Chlorhexidine Gluconate Cloth  6 each Topical Q0600  . cinacalcet  30 mg Oral Q T,Th,Sa-HD  . clopidogrel  75 mg Oral Daily  . dorzolamide-timolol  1 drop Both Eyes BID  . doxercalciferol  3 mcg Intravenous Q T,Th,Sa-HD  . feeding supplement (NEPRO CARB STEADY)  237 mL Oral BID BM  . heparin injection (subcutaneous)  5,000 Units Subcutaneous Q8H  . latanoprost  1 drop Both Eyes QHS  . pantoprazole  40 mg Oral Daily  . prednisoLONE acetate  1 drop Left Eye QID  . sucroferric oxyhydroxide  1,000 mg Oral TID with meals

## 2020-03-30 NOTE — Progress Notes (Signed)
IMTS Progress Note   Ms. Udell has not had any recurrent dark stools. Her FOBT was negative and CBC showed stable hemoglobin at 7.4. It is likely she is not having any upper GI bleeding.   Plan:  - Restart ASA, Plavix, heparin DVT PPx per Pharmacy  - Switch IV PPI BID to PO Protonix 40mg  daily for PPx  - Restart Carb-modified/renal diet w/ 1.2L fluid restriction  - Spoke to ID who will evaluate patient's ABX needs - Vascular have signed off; okay for discharge tomorrow from their standpoint  Jeralyn Bennett, MD 03/30/2020, 10:42 AM Pager: [phone]

## 2020-03-30 NOTE — Evaluation (Signed)
Physical Therapy Evaluation Patient Details Name: Rebekah Johnson MRN: [id number] DOB: 1960/04/05 Today's Date: 03/30/2020   History of Present Illness  60 yo female admitted with non healing R foot wound was seen 3/25 for Abdominal aortogram with right lower extremity runoff, angioplasty and stenting of right superficial and popliteal artery, angioplasty right tibioperoneal trunk and posterior tibial artery.  PMHx:  atherosclerosis, CAD with mult PCI, CHF, PAD, COPD, tobacco use, PUD, L BKA, MGUS, DM  Clinical Impression  Pt was seen for mobility on side of bed, but is tired and has been nauseated this AM.  Did a short assessment of standing tolerance and balance, strength testing and talked with her about the physical demands of home.  Her prosthesis is there, but is able to scoot and stand with nearly no help.  Follow along with her for RLE strengthening and standing balance work as her stay permits, follow up with HHPT briefly then back to outpatient therapy as soon as she is able to return.  Gait with prosthesis is not possible today due to being in her home, but will work with limits of this situation.    Follow Up Recommendations Home health PT;Supervision for mobility/OOB    Equipment Recommendations  None recommended by PT    Recommendations for Other Services       Precautions / Restrictions Precautions Precautions: Fall Precaution Comments: fluid restrictions, no prosthetic Required Braces or Orthoses: Other Brace (LLE prosthesis) Other Brace: prosthesis at home Restrictions Weight Bearing Restrictions: No Other Position/Activity Restrictions: L BKA      Mobility  Bed Mobility Overal bed mobility: Modified Independent             General bed mobility comments: pt requires extra time, denies RLE pain    Transfers Overall transfer level: Needs assistance Equipment used: Rolling walker (2 wheeled);1 person hand held assist Transfers: Sit to/from Stand Sit to  Stand: Min assist   Squat pivot transfers: Supervision     General transfer comment: pt requires help to fully stand but can scoot with supervision  Ambulation/Gait             General Gait Details: deferred  Stairs            Wheelchair Mobility    Modified Rankin (Stroke Patients Only)       Balance Overall balance assessment: Needs assistance Sitting-balance support: Feet supported;Single extremity supported Sitting balance-Leahy Scale: Good     Standing balance support: Bilateral upper extremity supported;During functional activity Standing balance-Leahy Scale: Poor Standing balance comment: poor with RW to attain fair standing balance                             Pertinent Vitals/Pain Pain Assessment: No/denies pain Faces Pain Scale: Hurts little more Pain Location: R leg Pain Descriptors / Indicators: Tender Pain Intervention(s): Monitored during session    Home Living Family/patient expects to be discharged to:: Private residence Living Arrangements: Non-relatives/Friends Available Help at Discharge: Family;Available 24 hours/day Type of Home: House Home Access: Stairs to enter;Ramped entrance   Entrance Stairs-Number of Steps: 3 Home Layout: One level Home Equipment: Walker - 2 wheels;Bedside commode;Wheelchair - manual;Tub bench;Walker - 4 wheels;Cane - single point Additional Comments: uses wc on ramp or walks with prosthesis to enter    Prior Function Level of Independence: Independent         Comments: has roommates per pt to help her  Hand Dominance   Dominant Hand: Right    Extremity/Trunk Assessment   Upper Extremity Assessment Upper Extremity Assessment: Defer to OT evaluation    Lower Extremity Assessment Lower Extremity Assessment: RLE deficits/detail;LLE deficits/detail RLE Deficits / Details: strength is 4- throughout RLE Coordination: decreased gross motor LLE Deficits / Details: L BKA LLE  Coordination: decreased gross motor    Cervical / Trunk Assessment Cervical / Trunk Assessment: Normal  Communication   Communication: No difficulties  Cognition Arousal/Alertness: Lethargic Behavior During Therapy: Flat affect Overall Cognitive Status: Within Functional Limits for tasks assessed                                 General Comments: nausea      General Comments General comments (skin integrity, edema, etc.): pt is up to sit on side of bed and is not complaining of pain, but is sensitive to move R knee to test strength    Exercises     Assessment/Plan    PT Assessment Patient needs continued PT services  PT Problem List Decreased strength;Decreased range of motion;Decreased activity tolerance;Decreased balance;Decreased mobility;Decreased coordination;Decreased knowledge of use of DME;Cardiopulmonary status limiting activity;Decreased skin integrity;Pain       PT Treatment Interventions DME instruction;Gait training;Functional mobility training;Therapeutic activities;Therapeutic exercise;Balance training;Neuromuscular re-education;Patient/family education    PT Goals (Current goals can be found in the Care Plan section)  Acute Rehab PT Goals Patient Stated Goal: return to outpatient therapy PT Goal Formulation: With patient Time For Goal Achievement: 04/12/20 Potential to Achieve Goals: Good    Frequency Min 3X/week   Barriers to discharge Inaccessible home environment;Decreased caregiver support has help and ramp to manage her mobility    Co-evaluation               AM-PAC PT "6 Clicks" Mobility  Outcome Measure Help needed turning from your back to your side while in a flat bed without using bedrails?: None Help needed moving from lying on your back to sitting on the side of a flat bed without using bedrails?: None Help needed moving to and from a bed to a chair (including a wheelchair)?: A Little Help needed standing up from a chair  using your arms (e.g., wheelchair or bedside chair)?: A Little Help needed to walk in hospital room?: A Little Help needed climbing 3-5 steps with a railing? : A Lot 6 Click Score: 19    End of Session Equipment Utilized During Treatment: Gait belt Activity Tolerance: Patient limited by fatigue;Treatment limited secondary to medical complications (Comment) (has been nauseated today) Patient left: in bed;with call bell/phone within reach;with bed alarm set Nurse Communication: Mobility status PT Visit Diagnosis: Unsteadiness on feet (R26.81);Muscle weakness (generalized) (M62.81);Difficulty in walking, not elsewhere classified (R26.2);Pain Pain - Right/Left: Right Pain - part of body: Leg    Time: 2330-0762 PT Time Calculation (min) (ACUTE ONLY): 24 min   Charges:   PT Evaluation $PT Eval Moderate Complexity: 1 Mod PT Treatments $Therapeutic Activity: 8-22 mins       Ramond Dial 03/30/2020, 12:28 PM Mee Hives, PT MS Acute Rehab Dept. Number: [place] [facility] [place]

## 2020-03-30 NOTE — Progress Notes (Addendum)
Pharmacy Antibiotic Note  MARIANNA CID is a 60 y.o. female admitted on 03/27/2020 with infection of right toe now s/p vascular intervention and is noted with ESRD on HD MWF.  She was on zosyn and to switch to fortaz and vancomycin. ID following ans plans for a minimum of 2 weeks antibiotics  Plan: -fortaz 2gm IV today then MWF with HD -vancomycin 1500 mg IV followed by 750 mg IV with HD MWF -Will follow  cultures and clinical progress   Height: 5\' 5"  (165.1 cm) Weight: 72.7 kg (160 lb 3.2 oz) IBW/kg (Calculated) : 57  Temp (24hrs), Avg:97.9 F (36.6 C), Min:97.1 F (36.2 C), Max:98.7 F (37.1 C)  Recent Labs  Lab 03/27/20 0235 03/27/20 1030 03/27/20 1558 03/28/20 0252 03/29/20 0121 03/30/20 0141 03/30/20 0940  WBC 20.4*  --   --  12.6* 10.6* 14.4* 14.4*  CREATININE 4.07*  --   --  3.99* 2.32* 2.35*  --   LATICACIDVEN  --  1.8 1.3  --   --   --   --     Estimated Creatinine Clearance: 24.8 mL/min (A) (by C-G formula based on SCr of 2.35 mg/dL (H)).    No Known Allergies  Hildred Laser, PharmD Clinical Pharmacist **Pharmacist phone directory can now be found on [email or web address] ([email or web address]).  Listed under [place].

## 2020-03-30 NOTE — Progress Notes (Signed)
  Progress Note    03/30/2020 10:05 AM 1 Day Post-Op  Subjective:  No complaints   Vitals:   03/30/20 0740 03/30/20 0945  BP: (!) 98/55   Pulse:  74  Resp:  14  Temp: 98.7 F (37.1 C)   SpO2:  100%   Physical Exam: Cardiac: regular rate and rhythm Lungs: non labored Extremities:left femoral access site is clean, dry and intact. No swelling or hematoma.  Well perfused and warm. Doppler right PT/ AT signals Abdomen:  Obese, soft, non tender Neurologic: alert and oriented  CBC    Component Value Date/Time   WBC 14.4 (H) 03/30/2020 0141   RBC 2.56 (L) 03/30/2020 0141   HGB 7.3 (L) 03/30/2020 0141   HGB 12.9 04/27/2019 0951   HGB 11.6 01/14/2017 0958   HCT 23.2 (L) 03/30/2020 0141   HCT 35.9 01/14/2017 0958   PLT 172 03/30/2020 0141   PLT 334 04/27/2019 0951   PLT 308 01/14/2017 0958   MCV 90.6 03/30/2020 0141   MCV 81 01/14/2017 0958   MCH 28.5 03/30/2020 0141   MCHC 31.5 03/30/2020 0141   RDW 15.6 (H) 03/30/2020 0141   RDW 14.9 01/14/2017 0958   LYMPHSABS 1.7 03/29/2020 0121   LYMPHSABS 2.6 01/14/2017 0958   MONOABS 1.0 03/29/2020 0121   EOSABS 0.1 03/29/2020 0121   EOSABS 0.2 01/14/2017 0958   BASOSABS 0.0 03/29/2020 0121   BASOSABS 0.1 01/14/2017 0958    BMET    Component Value Date/Time   NA 134 (L) 03/30/2020 0141   NA 141 10/07/2017 1234   K 3.5 03/30/2020 0141   CL 96 (L) 03/30/2020 0141   CO2 26 03/30/2020 0141   GLUCOSE 147 (H) 03/30/2020 0141   BUN 13 03/30/2020 0141   BUN 33 (H) 10/07/2017 1234   CREATININE 2.35 (H) 03/30/2020 0141   CREATININE 4.15 (HH) 04/27/2019 0951   CREATININE 1.19 (H) 12/18/2015 1006   CALCIUM 8.2 (L) 03/30/2020 0141   GFRNONAA 23 (L) 03/30/2020 0141   GFRNONAA 11 (L) 04/27/2019 0951   GFRNONAA 51 (L) 12/18/2015 1006   GFRAA 7 (L) 10/09/2019 0717   GFRAA 13 (L) 04/27/2019 0951   GFRAA 59 (L) 12/18/2015 1006    INR    Component Value Date/Time   INR 0.98 03/02/2017 0645     Intake/Output Summary (Last 24  hours) at 03/30/2020 1005 Last data filed at 03/30/2020 0247 Gross per 24 hour  Intake 960 ml  Output 1241 ml  Net -281 ml     Assessment/Plan:  60 y.o. female is s/p Abdominal aortogram with right lower extremity runoff, angioplasty and stenting of right superficial and popliteal artery (5 x 150, 5 x 150, 5 x 120 self-expanding stent), angioplasty right tibioperoneal trunk and posterior tibial artery (2 x 60)  1 Day Post-Op. RLE is well perfused with doppler DP, AT signals. She is okay from vascular standpoint for discharge. She will need to go home on Aspiring, statin and Plavix. She will follow up with Dr. Oneida Alar in 1 month with ABI and RLE arterial duplex.  DVT prophylaxis: sq heparin   Karoline Caldwell, PA-C [facility] [phone] 03/30/2020 10:05 AM

## 2020-03-30 NOTE — Progress Notes (Signed)
Pharmacy Antibiotic Note  Rebekah Johnson is a 60 y.o. female admitted on 03/27/2020 with infection of right toe now s/p vascular intervention.  Pharmacy has been consulted for Zosyn dosing.  Hx ESRD-HD usually MWF, off schedule 3/24 with plans to return to MWF schedule.   Plan: Zosyn 2.25g IV every 8 hours Monitor HD schedule, clinical progression and LOT  Height: 5\' 5"  (165.1 cm) Weight: 72.7 kg (160 lb 3.2 oz) IBW/kg (Calculated) : 57  Temp (24hrs), Avg:98 F (36.7 C), Min:97.1 F (36.2 C), Max:98.7 F (37.1 C)  Recent Labs  Lab 03/27/20 0235 03/27/20 1030 03/27/20 1558 03/28/20 0252 03/29/20 0121 03/30/20 0141  WBC 20.4*  --   --  12.6* 10.6* 14.4*  CREATININE 4.07*  --   --  3.99* 2.32* 2.35*  LATICACIDVEN  --  1.8 1.3  --   --   --     Estimated Creatinine Clearance: 24.8 mL/min (A) (by C-G formula based on SCr of 2.35 mg/dL (H)).    No Known Allergies  [name], PharmD PGY1 Acute Care Pharmacy Resident 03/30/2020 8:44 AM  Please check [email or web address] for unit specific pharmacy phone numbers.

## 2020-03-30 NOTE — Consult Note (Signed)
[facility]  Total days of antibiotics 4/piptazo               Reason for Consult: dfu    Referring Physician: narendra  Active Problems:   Right foot infection    HPI: Rebekah Johnson is a 60 y.o. female with hx of CAD, dHF, ESRD on HD, DM with diabetic neuropathy, hx of left BKA and DFU admitted for rgith great tole pain and worsening ulceration. She had been seen by orthopedics the day prior to admit but did not take oral abtx as of admission date. She was found to have leukocytosis of 20k, started on piptazo for her draining DFU. Blood cx NGTD. She was seen by vascular surgery who did aortagram and found that she had severe calcific atherosclerosis of right lower extremity with multiple subtotal occlusions diffusely throught the right superficial and popliteal arterty stented to have zero residual stenosis. She tolerated the intervention on 8/50 without complications. She is to continue on plavix and asa for her med management indefinitely. Primary team asking for abx recs given MRI findings suggesting early osteo vs. Reactive changes head of 2nd MT.  She remains afebrile during this hospitalization.   Past Medical History:  Diagnosis Date  . Acute congestive heart failure ([place])   . Acute diastolic CHF (congestive heart failure) ([place]) 09/14/2016  . Atrophic vaginitis 06/07/2012  . Avitaminosis D 07/28/2012  . CAD (coronary artery disease)    a. NSTEM 09/2016: cath showing severe diffuse disease of the RCA, ramus and Cx with mild-mod disease of LAD; PCI would require multiple stents and significant contrast usage thus medical therapy recommended.  . Cellulitis 07/12/2015  . Chest pain 01/01/2017  . Chronic diastolic CHF (congestive heart failure) ([place])   . Chronic kidney disease 09/23/2016  . CKD (chronic kidney disease), stage IV ([place])   . Constipation   . COPD GOLD  0 08/31/2017   Quit smoking 07/17/17 - Spirometry 09/01/2017  FEV1 1.23 (58%)  Ratio 57 with mild  curvature p no rx - 09/01/2017  After extensive coaching inhaler device,  effectiveness =    75% with elipta with cough provoked  - PFT's  10/14/2017  FEV1 1.69 (68 % ) ratio 84  p no % improvement from saba p nothing prior to study with DLCO  50 % corrects to 76  % for alv volume   - 10/14/2017  Walked RA x 3 laps @ 1  . Current tobacco use 06/10/2012  . Diabetes mellitus with neuropathy ([place])   . Diabetic foot ulcer ([place]) 07/13/2015  . Diabetic polyneuropathy associated with type 2 diabetes mellitus ([place]) 08/19/2017  . DOE (dyspnea on exertion) 09/01/2017   09/01/2017  Walked RA x 3 laps @ 185 ft each stopped due to  End of study, nl to mod fast  pace, no  desat   But stopped to rest x 2  Spirometry 09/01/2017  FEV1 1.23 (58%)  Ratio 57  > trial of anoro    . Dyspnea    with exertion  . ESRD (end stage renal disease) ([place])    MWF - [facility]  . Essential (primary) hypertension 06/10/2012  . Foot amputation status    a. h/o left foot transmetatarsal amputation  . Headache    Migraines  . High cholesterol   . History of biliary T-tube placement 06/10/2012   Patient unaware   . History of cardiac arrest   . HLD (hyperlipidemia) 09/02/2012  Overview:  ICD-10 cut over    . Hypertension    a. patent renal arteries by PV angio 08/2015. b. heavy proteinuria 09/2016 ? nephrotic.  [name] Hypertensive heart disease with heart failure ([place])   . Hypertriglyceridemia 07/28/2012  . Leg pain 06/10/2012  . Normocytic anemia    pt denies this dx  . NSTEMI (non-ST elevated myocardial infarction) ([place]) 01/01/2017  . Obesity   . Onychomycosis 12/24/2015  . Peripheral nerve disease 09/02/2012   legs  . Peripheral vascular disease ([place]) 07/28/2012  . Pneumonia 2018  . Proteinuria   . PVD (peripheral vascular disease) ([place])    a. status post left SFA and popliteal stent and left SFA PTA with drug coated balloon in 08/2015.  . Status post transmetatarsal amputation of foot, left ([place]) 09/06/2015  . Tobacco abuse    quit  cigarettes 2019, smokes marijuana  . Type 2 diabetes mellitus ([place]) 06/07/2012  . Type 2 diabetes mellitus with peripheral angiopathy ([place]) 09/02/2012    Allergies: No Known Allergies  Current antibiotics:   MEDICATIONS: . aspirin EC  81 mg Oral Daily  . atorvastatin  80 mg Oral Daily  . atropine  1 drop Left Eye Daily  . brimonidine  1 drop Both Eyes Q8H  . Chlorhexidine Gluconate Cloth  6 each Topical Q0600  . [START ON 04/01/2020] cinacalcet  30 mg Oral Q M,W,F-HD  . clopidogrel  75 mg Oral Daily  . dorzolamide-timolol  1 drop Both Eyes BID  . [START ON 04/01/2020] doxercalciferol  3 mcg Intravenous Q M,W,F-HD  . feeding supplement (NEPRO CARB STEADY)  237 mL Oral BID BM  . heparin injection (subcutaneous)  5,000 Units Subcutaneous Q8H  . latanoprost  1 drop Both Eyes QHS  . pantoprazole  40 mg Oral Daily  . prednisoLONE acetate  1 drop Left Eye QID  . sucroferric oxyhydroxide  1,000 mg Oral TID with meals    Social History   Tobacco Use  . Smoking status: Former Smoker    Packs/day: 1.00    Years: 42.00    Pack years: 42.00    Types: Cigarettes    Quit date: 07/17/2017    Years since quitting: 2.7  . Smokeless tobacco: Never Used  Vaping Use  . Vaping Use: Never used  Substance Use Topics  . Alcohol use: Not Currently    Alcohol/week: 1.0 - 2.0 standard drink    Types: 1 - 2 Glasses of wine per week    Comment: on social occassions  . Drug use: Yes    Frequency: 7.0 times per week    Types: Marijuana    Comment: daily use - last use 03/28/19    Family History  Problem Relation Age of Onset  . Diabetes Mother   . Hypertension Father   . Colon cancer Neg Hx   . Stomach cancer Neg Hx   . Pancreatic cancer Neg Hx      Review of Systems  Constitutional: Negative for fever, chills, diaphoresis, activity change, appetite change, fatigue and unexpected weight change.  HENT: Negative for congestion, sore throat, rhinorrhea, sneezing, trouble swallowing and sinus  pressure.  Eyes: Negative for photophobia and visual disturbance.  Respiratory: Negative for cough, chest tightness, shortness of breath, wheezing and stridor.  Cardiovascular: Negative for chest pain, palpitations and leg swelling.  Gastrointestinal: Negative for nausea, vomiting, abdominal pain, diarrhea, constipation, blood in stool, abdominal distention and anal bleeding.  Genitourinary: Negative for dysuria, hematuria, flank pain and difficulty urinating.  Musculoskeletal:  Negative for myalgias, back pain, joint swelling, arthralgias and gait problem.  Skin: wound to right foot. Negative for color change, pallor, rash and wound.  Neurological: decrease sensation to right foot. Negative for dizziness, tremors, weakness and light-headedness.  Hematological: Negative for adenopathy. Does not bruise/bleed easily.  Psychiatric/Behavioral: Negative for behavioral problems, confusion, sleep disturbance, dysphoric mood, decreased concentration and agitation.     OBJECTIVE: Temp:  [97.1 F (36.2 C)-98.7 F (37.1 C)] 98 F (36.7 C) (03/26 1326) Pulse Rate:  [70-117] 70 (03/26 1326) Resp:  [14-28] 18 (03/26 1326) BP: (93-151)/(51-80) 116/55 (03/26 1326) SpO2:  [91 %-100 %] 100 % (03/26 1326) Weight:  [67.2 kg-72.7 kg] 72.7 kg (03/26 0329) Physical Exam  Constitutional:  oriented to person, place, and time. appears well-developed and well-nourished. No distress.  HENT: [place]/AT, PERRLA, no scleral icterus Mouth/Throat: Oropharynx is clear and moist. No oropharyngeal exudate.  Cardiovascular: Normal rate, regular rhythm and normal heart sounds. Exam reveals no gallop and no friction rub.  No murmur heard.  Pulmonary/Chest: Effort normal and breath sounds normal. No respiratory distress.  has no wheezes.  Neck = supple, no nuchal rigidity Abdominal: Soft. Bowel sounds are normal.  exhibits no distension. There is no tenderness.  Lymphadenopathy: no cervical adenopathy. No axillary  adenopathy Neurological: alert and oriented to person, place, and time.  Skin: Skin is warm and dry. No rash noted. No erythema.  Psychiatric: a normal mood and affect.  behavior is normal.    LABS: Results for orders placed or performed during the hospital encounter of 03/27/20 (from the past 48 hour(s))  Glucose, capillary     Status: Abnormal   Collection Time: 03/28/20  5:53 PM  Result Value Ref Range   Glucose-Capillary 69 (L) 70 - 99 mg/dL    Comment: Glucose reference range applies only to samples taken after fasting for at least 8 hours.  Glucose, capillary     Status: Abnormal   Collection Time: 03/28/20  9:26 PM  Result Value Ref Range   Glucose-Capillary 100 (H) 70 - 99 mg/dL    Comment: Glucose reference range applies only to samples taken after fasting for at least 8 hours.  Renal function panel     Status: Abnormal   Collection Time: 03/29/20  1:21 AM  Result Value Ref Range   Sodium 135 135 - 145 mmol/L   Potassium 3.5 3.5 - 5.1 mmol/L   Chloride 98 98 - 111 mmol/L   CO2 28 22 - 32 mmol/L   Glucose, Bld 96 70 - 99 mg/dL    Comment: Glucose reference range applies only to samples taken after fasting for at least 8 hours.   BUN 15 8 - 23 mg/dL   Creatinine, Ser 2.32 (H) 0.44 - 1.00 mg/dL    Comment: DELTA CHECK NOTED DIALYSIS    Calcium 8.6 (L) 8.9 - 10.3 mg/dL   Phosphorus 2.4 (L) 2.5 - 4.6 mg/dL   Albumin 2.5 (L) 3.5 - 5.0 g/dL   GFR, Estimated 23 (L) >60 mL/min    Comment: (NOTE) Calculated using the CKD-EPI Creatinine Equation (2021)    Anion gap 9 5 - 15    Comment: Performed at [facility] [address] [place], [place] [address]  CBC with Differential/Platelet     Status: Abnormal   Collection Time: 03/29/20  1:21 AM  Result Value Ref Range   WBC 10.6 (H) 4.0 - 10.5 K/uL   RBC 3.01 (L) 3.87 - 5.11 MIL/uL   Hemoglobin 8.7 (  L) 12.0 - 15.0 g/dL   HCT 26.7 (L) 36.0 - 46.0 %   MCV 88.7 80.0 - 100.0 fL   MCH 28.9 26.0 - 34.0 pg   MCHC 32.6  30.0 - 36.0 g/dL   RDW 15.7 (H) 11.5 - 15.5 %   Platelets 177 150 - 400 K/uL   nRBC 0.0 0.0 - 0.2 %   Neutrophils Relative % 74 %   Neutro Abs 7.8 (H) 1.7 - 7.7 K/uL   Lymphocytes Relative 16 %   Lymphs Abs 1.7 0.7 - 4.0 K/uL   Monocytes Relative 9 %   Monocytes Absolute 1.0 0.1 - 1.0 K/uL   Eosinophils Relative 1 %   Eosinophils Absolute 0.1 0.0 - 0.5 K/uL   Basophils Relative 0 %   Basophils Absolute 0.0 0.0 - 0.1 K/uL   Immature Granulocytes 0 %   Abs Immature Granulocytes 0.04 0.00 - 0.07 K/uL    Comment: Performed at [facility] [address] [place], [place] [address]  ABO/Rh     Status: None   Collection Time: 03/29/20  1:21 AM  Result Value Ref Range   ABO/RH(D)      O POS Performed at [facility] [address] [place], [place] [address]   Glucose, capillary     Status: Abnormal   Collection Time: 03/29/20  7:16 AM  Result Value Ref Range   Glucose-Capillary 104 (H) 70 - 99 mg/dL    Comment: Glucose reference range applies only to samples taken after fasting for at least 8 hours.  POCT Activated clotting time     Status: None   Collection Time: 03/29/20 11:08 AM  Result Value Ref Range   Activated Clotting Time 249 seconds  POCT Activated clotting time     Status: None   Collection Time: 03/29/20 12:43 PM  Result Value Ref Range   Activated Clotting Time 255 seconds  POCT Activated clotting time     Status: None   Collection Time: 03/29/20  1:46 PM  Result Value Ref Range   Activated Clotting Time 142 seconds  Glucose, capillary     Status: Abnormal   Collection Time: 03/29/20  2:55 PM  Result Value Ref Range   Glucose-Capillary 102 (H) 70 - 99 mg/dL    Comment: Glucose reference range applies only to samples taken after fasting for at least 8 hours.  Glucose, capillary     Status: Abnormal   Collection Time: 03/29/20  4:01 PM  Result Value Ref Range   Glucose-Capillary 100 (H) 70 - 99 mg/dL    Comment: Glucose reference range applies only  to samples taken after fasting for at least 8 hours.  Glucose, capillary     Status: None   Collection Time: 03/29/20 10:20 PM  Result Value Ref Range   Glucose-Capillary 96 70 - 99 mg/dL    Comment: Glucose reference range applies only to samples taken after fasting for at least 8 hours.  Renal function panel     Status: Abnormal   Collection Time: 03/30/20  1:41 AM  Result Value Ref Range   Sodium 134 (L) 135 - 145 mmol/L   Potassium 3.5 3.5 - 5.1 mmol/L   Chloride 96 (L) 98 - 111 mmol/L   CO2 26 22 - 32 mmol/L   Glucose, Bld 147 (H) 70 - 99 mg/dL    Comment: Glucose reference range applies only to samples taken after fasting for at least 8 hours.   BUN 13 8 -  23 mg/dL   Creatinine, Ser 2.35 (H) 0.44 - 1.00 mg/dL   Calcium 8.2 (L) 8.9 - 10.3 mg/dL   Phosphorus 3.6 2.5 - 4.6 mg/dL   Albumin 2.3 (L) 3.5 - 5.0 g/dL   GFR, Estimated 23 (L) >60 mL/min    Comment: (NOTE) Calculated using the CKD-EPI Creatinine Equation (2021)    Anion gap 12 5 - 15    Comment: Performed at [facility] [address], [place], [place] [address]  CBC     Status: Abnormal   Collection Time: 03/30/20  1:41 AM  Result Value Ref Range   WBC 14.4 (H) 4.0 - 10.5 K/uL   RBC 2.56 (L) 3.87 - 5.11 MIL/uL   Hemoglobin 7.3 (L) 12.0 - 15.0 g/dL   HCT 23.2 (L) 36.0 - 46.0 %   MCV 90.6 80.0 - 100.0 fL   MCH 28.5 26.0 - 34.0 pg   MCHC 31.5 30.0 - 36.0 g/dL   RDW 15.6 (H) 11.5 - 15.5 %   Platelets 172 150 - 400 K/uL   nRBC 0.0 0.0 - 0.2 %    Comment: Performed at [facility], [place] [address] [place], [place] [address]  Glucose, capillary     Status: Abnormal   Collection Time: 03/30/20  6:14 AM  Result Value Ref Range   Glucose-Capillary 146 (H) 70 - 99 mg/dL    Comment: Glucose reference range applies only to samples taken after fasting for at least 8 hours.  Occult blood card to lab, stool RN will collect     Status: None   Collection Time: 03/30/20  9:26 AM  Result Value Ref Range   Fecal  Occult Bld NEGATIVE NEGATIVE    Comment: Performed at [facility], [address] [place], [place] [address]  Prepare RBC (crossmatch)     Status: None   Collection Time: 03/30/20  9:33 AM  Result Value Ref Range   Order Confirmation      ORDER PROCESSED BY BLOOD BANK Performed at [facility], [place] [address], [place], [place] [address]   CBC     Status: Abnormal   Collection Time: 03/30/20  9:40 AM  Result Value Ref Range   WBC 14.4 (H) 4.0 - 10.5 K/uL   RBC 2.57 (L) 3.87 - 5.11 MIL/uL   Hemoglobin 7.4 (L) 12.0 - 15.0 g/dL   HCT 23.0 (L) 36.0 - 46.0 %   MCV 89.5 80.0 - 100.0 fL   MCH 28.8 26.0 - 34.0 pg   MCHC 32.2 30.0 - 36.0 g/dL   RDW 15.5 11.5 - 15.5 %   Platelets 194 150 - 400 K/uL   nRBC 0.0 0.0 - 0.2 %    Comment: Performed at [facility], [place] [address] [place], [place] [address]  Type and screen [place]     Status: None (Preliminary result)   Collection Time: 03/30/20  9:40 AM  Result Value Ref Range   ABO/RH(D) O POS    Antibody Screen NEG    Sample Expiration 04/02/2020,2359    Unit Number [id number]    Blood Component Type RBC LR PHER2    Unit division 00    Status of Unit ISSUED    Transfusion Status OK TO TRANSFUSE    Crossmatch Result      Compatible Performed at [facility], [place] [address], [place], [place] [address]   Protime-INR     Status: None  Collection Time: 03/30/20  9:40 AM  Result Value Ref Range   Prothrombin Time 14.0 11.4 - 15.2 seconds   INR 1.1 0.8 - 1.2    Comment: (NOTE) INR goal varies based on device and disease states. Performed at [facility], [place] [address], [place], [place] [address]   Magnesium     Status: None   Collection Time: 03/30/20  9:40 AM  Result Value Ref Range   Magnesium 2.0 1.7 - 2.4 mg/dL    Comment: Performed at [facility] [address], [place], [place] [address]  Glucose, capillary     Status: Abnormal   Collection Time: 03/30/20 12:36  PM  Result Value Ref Range   Glucose-Capillary 157 (H) 70 - 99 mg/dL    Comment: Glucose reference range applies only to samples taken after fasting for at least 8 hours.     MICRO: reviewed IMAGING: PERIPHERAL VASCULAR CATHETERIZATION  Result Date: 03/29/2020 Procedure: Abdominal aortogram with right lower extremity runoff, angioplasty and stenting of right superficial and popliteal artery (5 x 150, 5 x 150, 5 x 120 self-expanding stent), angioplasty right tibioperoneal trunk and posterior tibial artery (2 x 60) Preoperative diagnosis: Nonhealing wound right foot Postoperative diagnosis: Same Anesthesia: Local with IV sedation, Versed 3 mg, fentanyl 175 mcg Sedation time: 148 minutes Contrast: 160 cc Operative findings: 1.  Severe calcific atherosclerosis right lower extremity involving the entire right superficial femoral-popliteal and tibial vessels multiple subtotal occlusions diffusely throughout the right superficial and popliteal artery stented to 0% residual stenosis. 2.  Runoff via peroneal and posterior tibial artery with reconstitution of the anterior tibial artery via collaterals Operative details: After obtaining informed consent, the patient taken the PV lab.  The patient was placed in supine position on the angio table.  Both groins were prepped and draped in usual sterile fashion.  Local anesthesia was infiltrated of the left common femoral artery.  This was inspected under ultrasound and found to be heavily calcified.  I initially used an introducer needle and was able to cannulate the artery but could not get the guidewire to advance.  The needle was removed and hemostasis obtained with direct pressure for 5 minutes.  I then attempted to use a micropuncture needle but this would not penetrate the calcium easily.  I went back to the standard introducer needle and cannulated slightly higher with some difficulty I was able to finally catheterize the artery despite the calcification.  I was  then able to advance 035 Bentson wire up in the abdominal aorta under fluoroscopic guidance.  Next a 5 French sheath was placed over the guidewire in the left common femoral artery.  This was thoroughly flushed with heparinized saline.  A 5 French pigtail catheter was advanced up into the abdominal aorta under fluoroscopic guidance.  Abdominal aortogram was obtained in AP projection.  Left and right renal arteries are small but patent.  The infrarenal abdominal aorta is patent.  The left and right common external and internal iliac arteries are calcified but patent.  Next right lower extremity runoff views were obtained through the pigtail catheter.  The right common femoral artery is patent.  The right profunda femoris is patent.  The right superficial femoral artery is patent but multiple segments of greater than 80% stenosis and short segments of subtotal occlusion.  The popliteal artery above the knee is similarly diseased.  The below-knee popliteal artery is small but patent.  The anterior tibial artery is occluded at its  origin.  The tibioperoneal trunk is diseased but there is a patent posterior tibial and peroneal artery. At this time I did intervene on the right leg areas of disease.  The 5 French pigtail catheter was removed over guidewire and swapped out for a 5 [place] crossover catheter.  I then advanced a guidewire into the mid right superficial femoral artery.  5 French sheath was swapped out for a 7 [place] destination sheath which was advanced up and over the aortic bifurcation and down into the mid right common femoral artery.  An 035 angled Glidewire was then advanced across the right leg lesions after administering 6000 units of heparin.  We gave the patient an additional 5000 units of heparin during the course of the case.  The guidewire was advanced all the way down in the tibioperoneal trunk.  A 035 quick cross was then placed over this and swapped out for a 035 Rosen wire.  I then attempted to  place a 5-50 angioplasty balloon but could only get this to advance to the level of the adductor hiatus.  I did 2 overlapping inflations with [place] low back to the origin of the SFA.  There was still a lot of resistance on the wire secondary to multiple stenoses and occlusions.  In efforts to try to advance the balloon the wire pulled back into the mid SFA.  At this point we readvanced the [place] were able to get back down the tibioperoneal trunk.  I tried this 1 more time but again the wire pulled back.  We then converted over to a 018 system with a V 18 wire.  It was advanced this all the way down to the tibioperoneal trunk and then get a stable platform.  Contrast angiogram was performed which showed that the tibioperoneal trunk was now not opacified with contrast.  Therefore a 2 x 60 jade angioplasty balloon was brought up and advanced and centered from the proximal posterior tibial artery all the way back into the tibioperoneal trunk.  This was inflated to nominal pressure and completion angiogram of the segment showed restored patency of the tibioperoneal trunk and posterior tibial artery with peroneal and posterior tibial runoff.  There was some blush of contrast suggesting a perforation in the distal popliteal artery but there was minimal extravasation.  My attention was then turned to the remainder of the popliteal and superficial femoral artery and we ballooned this with a 5 balloon all the way up to the superficial femoral artery origin.  Contrast angiogram had showed multiple areas of dissection.  The patient was becoming fairly restless at this point.  I decided to go ahead and stent all of this area since she did not really give [place] much opportunity to fix focal defined lesions.  We then stented her with Inova stents a 5 x 120 stacked on another 5 x 120 with a 5 x 120.  The stent extended from the knee joint all the way to the SFA origin about 2 cm after the takeoff of this.  You the patient's  motion on the table I did not feel safe coming all the way back to the SFA origin because I did not want to compromise the profunda.  At this point completion angiogram was performed which showed wide patency of the SFA and popliteal artery with brisk runoff through the tibioperoneal trunk posterior tibial and peroneal arteries all the way to level foot.  There is also pretty good flow at this point through  the anterior tibial artery although the origin was occluded.  It was filling from collaterals.  There was still a small blush of contrast in the popliteal artery and ACT was still 250.  At this point we administered 50 mg of protamine to help achieve hemostasis in the popliteal artery.  The patient is already on Plavix and aspirin and these will be continued for lifetime. The sheath was pulled back down in the left hemipelvis and left in place to be pulled after the ACT is less than 175.  Although the patient was fairly agitated during the course of the procedure there were no complications.  Patient was taken to the holding area in stable condition. Operative management: Patient now has inline flow to the right foot.  She needs a lifetime of Plavix and aspirin unless she has any future bleeding episodes and we could reconsider whether or not to stop this. From a vascular surgery standpoint she most likely can go home tomorrow and then we will have her seen back in follow-up to recheck the wound on her foot. Ruta Hinds, MD [facility] Office: [phone]   MRI IMPRESSION: Negative for abscess, septic joint or osteomyelitis in the first metatarsal or great toe.  Mild edema in the diaphysis of the second metatarsal has an appearance most suggestive of stress change rather than infection.  Edema in the proximal phalanx of the third toe and medial aspect of the head of the second metatarsal is nonspecific. It could be due to osteomyelitis but no overlying skin wound  is identified. Stress or reactive change are also possible.  Subcutaneous edema about the dorsum of the foot consistent with cellulitis and/or dependent change.  Assessment/Plan:  60yo F with ESRD on HD, PAD s/p revascularization of right SFA, popliteal run-offs, presented with DFU  - recommend to change to vancomycin plus ceftaz with HD, plan a minimum of 2 weeks - due to no associated wound about 2nd MTH, I suspect the MRI changes may be reactive change rather than osteomyelitis - continue with wound care  PAD = continue on plavix and aspirin  Post operative hgb drop = getting RBC transfusion  Diabetes = important for patient to have well controlled DM to help with management of healing her wounds.  - will do 2 weeks of Iv abtx with HD and have her come to back to ID clinic for further evaluation to see if needs further abtx.

## 2020-03-30 NOTE — Progress Notes (Addendum)
Subjective:   Rebekah Johnson states she feels well today and is eager to go home before noon. She says the pain in her left foot is much better after her procedure, although does ask to put her leg over the bed again. She denies any bleeding at her catheter insertion site although notes bruising in her LLQ at her blood thinner injection site. She had one very large, watery, very dark bowel movement early this morning. She attributes the dark color of her stool to iron that she says she received during HD; however, this dark color is new for her s/p lack of bowel movement for 6 days. She has not received iron in HD due to her infection. She notes she had been admitted to the hospital in the past for constipation although "nothing was done for it", resolved, and constipation has not been a recent issue for her otherwise. Endorses severe dizziness upon sitting up. She had a large volume clear liquid emesis overnight. She is unable to specify how many times she vomits per day but says it is unrelated to food and is chronic. Denies nausea currently although notes recent cough with phlegm. No SOB, CP, palpitations, or other symptoms.   Upon attempting to do a rectal exam, patient endorsed acute-onset SOB and said she was having a panic attack. She became very tearful, overwhelmed by all that has gone on during her hospital stay. She was reassured, calmed, and rectal exam was performed.   Objective:  Vital signs in last 24 hours: Vitals:   03/29/20 2149 03/29/20 2212 03/29/20 2355 03/30/20 0329  BP: (!) 106/59 (!) 113/58 (!) 93/55 (!) 119/57  Pulse: 92 92  84  Resp: 20 14 18 18   Temp: 98 F (36.7 C) 97.9 F (36.6 C) 97.9 F (36.6 C) 97.7 F (36.5 C)  TempSrc: Oral Oral Oral Oral  SpO2: 91% 94% 94% 100%  Weight:    72.7 kg  Height:       General: Patient appears tired but resting comfortably. NAD. HENT: MMM. Inflamed conjunctiva bilaterally. No erythema or drainage. Respiratory: Some active  cough although lungs are CTA, bilaterally. No wheezes, rales, or rhonchi.  Cardiovascular: Rate is normal. Rhythm is NSR although with frequent PVC's per telemetry. No murmurs, rubs, or gallops. No lower extremity edema. DP pulse is palpable in RLE.  MSK: Patient is s/p L BKA. She has tenderness to palpation over the left groin catheter insertion site without swelling. Right foot TTP has improved. Abdominal: Bowel sounds intact. Soft and non-distended with minimal tenderness to palpation of the LLQ. No rebound or guarding. DRE: There were no palpable masses, fissures, or lesions. No palpable stool ball. Liquid stool was light brown in color without any gross blood.  Skin: There is a ~1.5x1cm ulceration over the ball of the plantar foot without active drainage or significant erythema. There is a tiny, punctate lesion between the base of the right 2nd and 3rd toes. There is a ~1x1cm area of healed scarring posterior to the rectum.  Psych: Normal affect. Normal tone of voice.   Assessment/Plan:  Active Problems:   Right foot infection  # Cellulitis / Possible Osteomyelitis in setting of Diabetic Foot Wound of RLE  # Hx of Severe PAD of B/L LE's s/p R SFA/Popliteal artery and R tibeoperoneal trunk and PT artery stenting  R foot MRI showed changes of the proximal phalanx of the 3rd toe and 2nd metatarsal head as well as mild edema of the 2nd metatarsal of  the right foot, concerning for possible osteomyelitis. She remains afebrile with a bump in her WBC although likely reactive to aortogram w/ RLE runoff yesterday. Her right foot pain has improved significantly s/p R SFA/Popliteal artery and R tibeoperoneal trunk and PT artery stenting on DAPT and distal pulse is now palpable; however, I do have concerns she may have active GI bleeding given hemoglobin drop with dark stools.  - Greatly appreciate vascular's assistance and recommendations  - Continue IV Zosyn for now  - Will reach out to ID to discuss  most appropriate ABX management  - Blood cultures show no growth x 2 days  - Continue Dilaudid 2mg  PO q2 hours PRN for now; anticipate she will no longer require this soon - Continue to monitor for any signs of bleeding  - Monitor daily CBC   Acute on Chronic Normocytic Anemia, Worsening Dark Stools Concerning for Melena  Constipation, Resolved  Chronic Nausea/Vomiting Patient noted her last BM was 03/24/20. She had success with a "brown liquid" in the past and was not responsive to Miralax alone here. She was started on Lactulose yesterday and states she passed a very large, watery, very dark stool early this morning. Attributes her dark stool to iron with HD although she has not been receiving this 2/2 infection. Does have mild LLQ tenderness on exam. Her last colonoscopy was 07/2018 which showed mild internal hemorrhoids and a few small-mouthed sigmoid and descending colon, although without polyps or other inflammatory changes. She had orthostatic symptoms upon sitting up which is new. Blood pressure is now soft although she is not tachycardic. DRE performed for FOBT, which did not show any obvious concerning findings.  - Hold further miralax/lactulose  - Hold ASA, Plavix, PPx Heparin for now - Check FOBT  - Repeat stat CBC pre-transfusion  - Plan for 1 unit RBC transfusion w/ post-transfusion H&H - Transfuse as needed for Hgb goal > 8 - Will consult GI if concerns for ongoing GI bleeding  - Low dose Zofran if needed although try to avoid given significantly prolonged QTc   ESRD on HD  Patient completed a short session of HD yesterday. Nephrology's goal is to get patient back on her home MWF dialysis schedule. She appears euvolemic. - Nephrology following, appreciate their recommendation  - Do not plan for repeat HD session until Monday  - Appreciate nephrology's assistance in electrolyte replacement  - Continue to monitor renal function  - Renal/carb modified diet w/ fluid restriction    Ventricular Bigeminy  Frequent PVC's Prolonged QTc Interval  Patient's QTc interval is significantly prolonged, close to 500. She has have frequent PVC's since yesterday. She did have a very short episode of ventricular bigeminy overnight. K+ borderline at 3.5. - Continuous telemetry monitoring  - Check Mg and continue to monitor electrolytes closely  - Avoid QTc prolonging medications as able  - Will consider starting beta blocker if persistent and BP allows   Panic Attack Patient endorses having had a panic attack just prior to rectal exam in the setting of her complex hospital stay.  - Continue redirection as able  - Continue close monitoring for now   Diabetes mellitus Patient has sitagliptin 25 mg daily and glipizide 5 mg daily listed in her medication list. Patient does not have these medications with the medications that she brought to the hospital. She states her glipizide has been discontinued. Hgb A1c low at 4.7. Sugar slightly elevated this morning although likely reactive to recent procedure.  - Continue to  monitor closely, especially while NPO. - Discontinue SSI  Hypokalemia, Resolved Hypophosphatemia, Resolved   Prior to Admission Living Arrangement: Home w/ significant other Anticipated Discharge Location: Pending further workup  Barriers to Discharge: IV Antibiotics / Further workup   Jeralyn Bennett, MD 03/30/2020, 6:44 AM Pager: [phone] After 5pm on weekdays and 1pm on weekends: On Call pager [phone]

## 2020-03-30 NOTE — Evaluation (Signed)
Occupational Therapy Evaluation Patient Details Name: Rebekah Johnson MRN: [id number] DOB: Apr 18, 1960 Today's Date: 03/30/2020    History of Present Illness 60 yo female admitted with non healing R foot wound was seen 3/25 for Abdominal aortogram with right lower extremity runoff, angioplasty and stenting of right superficial and popliteal artery, angioplasty right tibioperoneal trunk and posterior tibial artery.  PMHx:  atherosclerosis, CAD with mult PCI, CHF, PAD, COPD, tobacco use, PUD, L BKA, MGUS, DM   Clinical Impression   Patient admitted for the diagnosis and procedure above.  Patient was able to move pretty good this date, limited by nausea.  RN notified and medication given.  Patient with Mod I bed mobility and Min Guard with SPT.  Deficits listed below.  PTA she was participating with outpatient PT for mobility with prosthetic.  She remained Mod I with ADL at sink level, but has friends that she lives with will assist as needed.  OT to continue in the acute setting to ensure safe transition home.  No post acute OT appears needed.      Follow Up Recommendations  No OT follow up    Equipment Recommendations  None recommended by OT    Recommendations for Other Services       Precautions / Restrictions Precautions Precautions: Fall Precaution Comments: 1200 cc fluid restriction Restrictions Weight Bearing Restrictions: No Other Position/Activity Restrictions: L BKA      Mobility Bed Mobility Overal bed mobility: Modified Independent             General bed mobility comments: moving slow due to R leg pain    Transfers Overall transfer level: Needs assistance   Transfers: Squat Pivot Transfers     Squat pivot transfers: Min guard          Balance Overall balance assessment: Needs assistance Sitting-balance support: Feet supported;Single extremity supported Sitting balance-Leahy Scale: Good     Standing balance support: No upper extremity  supported Standing balance-Leahy Scale: Fair                             ADL either performed or assessed with clinical judgement   ADL Overall ADL's : Needs assistance/impaired Eating/Feeding: Independent;Sitting   Grooming: Wash/dry hands;Wash/dry face;Set up;Sitting           Upper Body Dressing : Set up;Sitting   Lower Body Dressing: Modified independent;Bed level               Functional mobility during ADLs: Min guard General ADL Comments: squat pivot     Vision Baseline Vision/History: No visual deficits Patient Visual Report: No change from baseline                  Pertinent Vitals/Pain Pain Assessment: Faces Faces Pain Scale: Hurts little more Pain Location: R leg Pain Descriptors / Indicators: Tender Pain Intervention(s): Monitored during session     Hand Dominance Right   Extremity/Trunk Assessment Upper Extremity Assessment Upper Extremity Assessment: Overall WFL for tasks assessed   Lower Extremity Assessment Lower Extremity Assessment: Defer to PT evaluation   Cervical / Trunk Assessment Cervical / Trunk Assessment: Normal   Communication Communication Communication: No difficulties   Cognition Arousal/Alertness: Lethargic Behavior During Therapy: WFL for tasks assessed/performed Overall Cognitive Status: Within Functional Limits for tasks assessed  General Comments: nausea   General Comments                  Home Living Family/patient expects to be discharged to:: Private residence Living Arrangements: Non-relatives/Friends Available Help at Discharge: Family;Available 24 hours/day Type of Home: House Home Access: Stairs to enter [employer] of Steps: 3   Home Layout: One level     Bathroom Shower/Tub: Teacher, early years/pre: Standard     Home Equipment: Environmental consultant - 2 wheels;Bedside commode;Wheelchair - manual;Tub bench;Walker - 4  wheels;Cane - single point          Prior Functioning/Environment Level of Independence: Independent                 OT Problem List: Decreased activity tolerance;Impaired balance (sitting and/or standing);Pain      OT Treatment/Interventions: Self-care/ADL training;DME and/or AE instruction;Balance training;Therapeutic activities    OT Goals(Current goals can be found in the care plan section) Acute Rehab OT Goals Patient Stated Goal: Get back to outpatient therapy.  I'm almost at cane level. OT Goal Formulation: With patient Time For Goal Achievement: 04/13/20 Potential to Achieve Goals: Good  OT Frequency: Min 2X/week   Barriers to D/C:    none noted       Co-evaluation              AM-PAC OT "6 Clicks" Daily Activity     Outcome Measure Help from another person eating meals?: None Help from another person taking care of personal grooming?: None Help from another person toileting, which includes using toliet, bedpan, or urinal?: A Little Help from another person bathing (including washing, rinsing, drying)?: A Little Help from another person to put on and taking off regular upper body clothing?: None Help from another person to put on and taking off regular lower body clothing?: A Little 6 Click Score: 21   End of Session Equipment Utilized During Treatment: Oxygen Nurse Communication: Mobility status  Activity Tolerance: Other (comment) (limited by nausea) Patient left: in bed;with call bell/phone within reach;with nursing/sitter in room  OT Visit Diagnosis: Unsteadiness on feet (R26.81);Pain Pain - Right/Left: Right Pain - part of body: Leg                Time: 1121-1140 OT Time Calculation (min): 19 min Charges:  OT General Charges $OT Visit: 1 Visit OT Evaluation $OT Eval Moderate Complexity: 1 Mod  03/30/2020  Rich, OTR/L  Acute Rehabilitation Services  Office:  [phone]   Metta Clines 03/30/2020, 11:01 AM

## 2020-03-31 LAB — RENAL FUNCTION PANEL
Albumin: 2.1 g/dL — ABNORMAL LOW (ref 3.5–5.0)
Anion gap: 11 (ref 5–15)
BUN: 18 mg/dL (ref 8–23)
CO2: 28 mmol/L (ref 22–32)
Calcium: 7.9 mg/dL — ABNORMAL LOW (ref 8.9–10.3)
Chloride: 94 mmol/L — ABNORMAL LOW (ref 98–111)
Creatinine, Ser: 3.72 mg/dL — ABNORMAL HIGH (ref 0.44–1.00)
GFR, Estimated: 13 mL/min — ABNORMAL LOW (ref >60)
Glucose, Bld: 110 mg/dL — ABNORMAL HIGH (ref 70–99)
Phosphorus: 3.4 mg/dL (ref 2.5–4.6)
Potassium: 3.3 mmol/L — ABNORMAL LOW (ref 3.5–5.1)
Sodium: 133 mmol/L — ABNORMAL LOW (ref 135–145)

## 2020-03-31 LAB — CBC
HCT: 23.1 % — ABNORMAL LOW (ref 36.0–46.0)
HCT: 28.5 % — ABNORMAL LOW (ref 36.0–46.0)
Hemoglobin: 7.4 g/dL — ABNORMAL LOW (ref 12.0–15.0)
Hemoglobin: 9.4 g/dL — ABNORMAL LOW (ref 12.0–15.0)
MCH: 28.5 pg (ref 26.0–34.0)
MCH: 29.4 pg (ref 26.0–34.0)
MCHC: 32 g/dL (ref 30.0–36.0)
MCHC: 33 g/dL (ref 30.0–36.0)
MCV: 88.8 fL (ref 80.0–100.0)
MCV: 89.1 fL (ref 80.0–100.0)
Platelets: 172 10*3/uL (ref 150–400)
Platelets: 183 10*3/uL (ref 150–400)
RBC: 2.6 MIL/uL — ABNORMAL LOW (ref 3.87–5.11)
RBC: 3.2 MIL/uL — ABNORMAL LOW (ref 3.87–5.11)
RDW: 15.2 % (ref 11.5–15.5)
RDW: 15 % (ref 11.5–15.5)
WBC: 14.1 10*3/uL — ABNORMAL HIGH (ref 4.0–10.5)
WBC: 15 10*3/uL — ABNORMAL HIGH (ref 4.0–10.5)
nRBC: 0 % (ref 0.0–0.2)
nRBC: 0 % (ref 0.0–0.2)

## 2020-03-31 LAB — GLUCOSE, CAPILLARY
Glucose-Capillary: 116 mg/dL — ABNORMAL HIGH (ref 70–99)
Glucose-Capillary: 123 mg/dL — ABNORMAL HIGH (ref 70–99)
Glucose-Capillary: 99 mg/dL (ref 70–99)
Glucose-Capillary: 103 mg/dL — ABNORMAL HIGH (ref 70–99)

## 2020-03-31 LAB — HEMOGLOBIN AND HEMATOCRIT, BLOOD
HCT: 29 % — ABNORMAL LOW (ref 36.0–46.0)
Hemoglobin: 9.2 g/dL — ABNORMAL LOW (ref 12.0–15.0)

## 2020-03-31 LAB — PREPARE RBC (CROSSMATCH)

## 2020-03-31 MED ORDER — CARVEDILOL 3.125 MG PO TABS
3.1250 mg | ORAL_TABLET | Freq: Two times a day (BID) | ORAL | Status: DC
Start: 2020-03-31 — End: 2020-04-01
  Administered 2020-03-31 – 2020-04-01 (×2): 3.125 mg via ORAL
  Filled 2020-03-31 (×2): qty 1

## 2020-03-31 MED ORDER — NALOXONE HCL 0.4 MG/ML IJ SOLN: 0.4000 mg | INTRAMUSCULAR | Status: DC | PRN

## 2020-03-31 MED ORDER — ASPIRIN EC 81 MG PO TBEC
81.0000 mg | DELAYED_RELEASE_TABLET | Freq: Every day | ORAL | Status: DC
  Administered 2020-03-31 – 2020-04-01 (×2): 81 mg via ORAL
  Filled 2020-03-31 (×2): qty 1

## 2020-03-31 MED ORDER — HEPARIN SODIUM (PORCINE) 5000 UNIT/ML IJ SOLN
5000.0000 [IU] | Freq: Three times a day (TID) | INTRAMUSCULAR | Status: DC
  Administered 2020-03-31 – 2020-04-01 (×3): 5000 [IU] via SUBCUTANEOUS
  Filled 2020-03-31 (×3): qty 1

## 2020-03-31 MED ORDER — SODIUM CHLORIDE 0.9% IV SOLUTION: Freq: Once | INTRAVENOUS | Status: AC

## 2020-03-31 MED ORDER — CLOPIDOGREL BISULFATE 75 MG PO TABS
75.0000 mg | ORAL_TABLET | Freq: Every day | ORAL | Status: DC
  Administered 2020-03-31 – 2020-04-01 (×2): 75 mg via ORAL
  Filled 2020-03-31 (×2): qty 1

## 2020-03-31 MED ORDER — POTASSIUM CHLORIDE CRYS ER 20 MEQ PO TBCR
30.0000 meq | EXTENDED_RELEASE_TABLET | Freq: Once | ORAL | Status: AC
  Administered 2020-03-31: 30 meq via ORAL
  Filled 2020-03-31: qty 1

## 2020-03-31 MED ORDER — PANTOPRAZOLE SODIUM 40 MG PO TBEC
40.0000 mg | DELAYED_RELEASE_TABLET | Freq: Two times a day (BID) | ORAL | Status: DC
  Administered 2020-03-31 – 2020-04-01 (×2): 40 mg via ORAL
  Filled 2020-03-31 (×2): qty 1

## 2020-03-31 NOTE — Progress Notes (Signed)
Subjective:   Overnight, Rebekah Johnson received a blood transfusion due to second drop in her hemoglobin to 7.4. She did become nauseas during PT/OT evaluations yesterday, although states this has improved today without any vomiting. She says she continues to have a considerable amount of right leg and foot pain any time she moves her leg. Her pain is intermittent. She is excited though that she is able to wiggle her toes much better today with decreased swelling in her foot. She continues to endorse mild left upper abdominal pain but says otherwise her abdomen has resolved. Her last bowel movement was yesterday. She continues to pass gas and denies bleeding from anywhere.   Objective:  Vital signs in last 24 hours: Vitals:   03/31/20 0524 03/31/20 0806 03/31/20 0900 03/31/20 0902  BP: (!) 123/47 118/63 (!) 137/91   Pulse: 69 72 81   Resp: 16 17 (!) 22   Temp: 98.7 F (37.1 C) 98.3 F (36.8 C)  98.1 F (36.7 C)  TempSrc: Oral Oral  Oral  SpO2: 96% 97% 100%   Weight:      Height:       General: Patient intermittently appears uncomfortable although is not in acute distress. HENT: MMM.  Respiratory: No longer has an active cough. Breathing comfortably on room air.   Cardiovascular: Rate is normal. Rhythm is NSR although with frequent PVC's. No murmurs, rubs, or gallops. No lower extremity edema. Extremities are warm and well-perfused. MSK: Patient is s/p L BKA. ROM continues to improve in RLE. She does have point tenderness over the plantar aspect of the 2nd MTP joint and tenderness laterally over her 1st right MTP and distal right lower calf.  Abdominal: There is minimal epigastric tenderness to palpation but no guarding or other tenderness to palpation. No palpable masses.  Skin: There is a ~1.5x1cm ulceration over the ball of the ball over the first metatarsal head without active drainage or significant erythema, although there is surrounding darkened tissue consistent with necrosis.  There is a tiny, punctate lesion between the base of the right 2nd and 3rd toes. There are multiple punctate, raised, white, tender lesions over patient's right calf and single lesion on the medial right foot.  Psych: Intermittently tearful due to pain. Pleasant and cooperative.    Assessment/Plan:  Active Problems:   Right foot infection  # Cellulitis / Possible Osteomyelitis in setting of Diabetic Foot Wounds of RLE  # Hx of Severe PAD of B/L LE's s/p R SFA/Popliteal artery and R tibeoperoneal trunk and PT artery stenting 03/29/20 R foot MRI showed changes of the proximal phalanx of the 3rd toe and 2nd metatarsal head as well as mild edema of the 2nd metatarsal of the right foot, concerning for possible osteomyelitis. She remains afebrile with stable leukocytosis. ID evaluated her yesterday and believe her MRI changes may be reactive rather than osteomyelitis. Recommended changing her ABX as below. She does have significant TTP over the 2nd MTP with a lesion just distal to this.   - IV Zosyn changed to vancomycin + ceftazidime  - Plan for a minimum of 2 weeks of therapy, although will reach out to ID to discuss potential of osteomyelitis given exam findings  - Consult placed for wound care  - Encouraged more frequent nursing evaluations of pain  - Continue PO Dilaudid 2mg  q4 hours PRN  - Continue IV Dilaudid 0.5-1mg  q2 hours PRN for breakthrough pain  - Placed order for Narcan IV PRN if concern for opioid overdose -  Continue monitoring daily CBC   Acute on Chronic Normocytic Anemia Acute on Chronic Constipation  Chronic N/V Patient noted her last BM was 03/24/20 prior to coming into the hospital although did have a dark, liquid bowel movement yesterday early morning after lactulose was started. She denies a history of GI bleeding. Her last colonoscopy 07/2018 did show mild internal hemorrhoids and a few small-mouthed sigmoid and descending colonic diverticula, although no polyps or other  abnormalities. She does not have an EGD in the system. She does not have any other apparent sources of bleeding and FOBT was negative. She received a a blood transfusion yesterday afternoon with incomplete hgb response, although Hgb improved from 7.4 to 9.2 following 2nd RBC transfusion this morning.   - Will restart ASA, Plavix, and Heparin DVT PPx  - Hold further miralax / lactulose for now  - Hold further miralax/lactulose  - Give 40mg  PO Protonix BID for now  - Will repeat CBC later today  - Will discontinue Zofran for now given very prolonged QTc - Continue close monitoring   ESRD on HD  Hypokalemia Hypophosphatemia, Resolved  Calciphylaxis, RLE Patient is now back to her home schedule with HD MWF. K+ slightly low this morning at 3.3. On exam, she does have tender calciphylaxis on her RLE despite normal phosphorus levels, likely in the setting of her reduced blood flow.    - Nephrology following, appreciate their recommendation  - Will be scheduled for HD tomorrow  - Given KCl 82mEq PO  - Appreciate nephrology's assistance in electrolyte replacement  - Continue to monitor renal function daily - Renal/carb modified diet w/ fluid restriction   Ventricular Bigeminy, Resolved  Frequent PVC's Prolonged QTc Interval  Patient's QTc interval appears increasingly prolonged with IV Zofran for nausea. She continues to have frequent PVC's. Potassium is low at 3.3 today.   - Continuous telemetry monitoring  - Check morning electrolytes and continue replacement for goal K > 4, Mg > 2 - Avoid QTc prolonging medications as able (Zosyn discontinued)  - Start Coreg 3.125mg  (BID if pressures allow) with hx of CAD   Hx of Diabetes mellitus Patient has sitagliptin 25 mg daily and glipizide 5 mg daily listed in her medication list. Patient does not have these medications with the medications that she brought to the hospital. She states her glipizide has been discontinued. Hgb A1c low at 4.7. Sugars  remain within goal without insulin.   - Continue to monitor CBG's  Consults: Vascular and Nephrology have signed off. Appreciate ID's recommendations.  Code status: Full code  DVT PPx: Heparin  Prior to Admission Living Arrangement: Home w/ significant other and  Anticipated Discharge Location: Home w/ [facility] for short time before resuming outpatient PT Barriers to Discharge: IV Antibiotics / Further workup   Jeralyn Bennett, MD 03/31/2020, 11:07 AM Pager: [phone] After 5pm on weekdays and 1pm on weekends: On Call pager [phone]

## 2020-03-31 NOTE — Progress Notes (Signed)
Subjective:  Seen in room, no new c/o.    Objective Vital signs in last 24 hours: Vitals:   03/31/20 0806 03/31/20 0900 03/31/20 0902 03/31/20 1145  BP: 118/63 (!) 137/91  126/62  Pulse: 72 81  73  Resp: 17 (!) 22  16  Temp: 98.3 F (36.8 C)  98.1 F (36.7 C) 97.7 F (36.5 C)  TempSrc: Oral  Oral Oral  SpO2: 97% 100%  95%  Weight:      Height:       Weight change: -1.9 kg  Physical Exam: General: chronically ill-appearing female, nad  Heart: RRR no MRG Lungs: CTA except decreased in bases bilaterally, nonlabored breathing Abdomen: Soft non-tender. BS +  Extremities: L BKA, no stump edema; RLE bandaged  Dialysis Access: LUE AVF +bruit   OP HD: [facility] MWF   3h 83min  64kg   3K/2.25 bath  Hep none  LUA AVF  - hect 3 tiw  - mircera 60 q2 last 3/12  - venofer 100 x 5 to start next hd    Assessment/Plan: 1. PAD /Non healing R foot wound --IV antibiotics started per primary.  Noted to have severe RLE arterial disease. S/p aortogram with angioplasty and stenting to right superficial femoral-popliteal and tibial vessels on 3/25. Per primary team.  2. ESRD -HD MWF schedule. Next HD Monday.  3. Hypokalemia -- K+ 3.5  Supplement prn.  4. Hypertension/volume  - BP elevated on admission. Wt's not accurate. Vol stable on exam 5. Anemia of ESRD-Hgb 8.7.  Continue ESA - Give Aranesp 60 with HD 3/25. Hold IV iron with infection 6. Metabolic bone disease -corrected calcium 9.8 , phos 3.7 VELPHORO as binder/Hectorol on HD also Sensipar on dialysis 7. Diabetes mellitus Type 2  8. CAD 9. Dispo - stable for d/c from renal standpoint   Kelly Splinter, MD 03/31/2020, 12:33 PM    Labs: Basic Metabolic Panel: Recent Labs  Lab 03/29/20 0121 03/30/20 0141 03/31/20 0222  NA 135 134* 133*  K 3.5 3.5 3.3*  CL 98 96* 94*  CO2 28 26 28   GLUCOSE 96 147* 110*  BUN 15 13 18   CREATININE 2.32* 2.35* 3.72*  CALCIUM 8.6* 8.2* 7.9*  PHOS 2.4* 3.6 3.4   Liver Function Tests: Recent  Labs  Lab 03/27/20 0235 03/28/20 0252 03/29/20 0121 03/30/20 0141 03/31/20 0222  AST 20  --   --   --   --   ALT 26  --   --   --   --   ALKPHOS 81  --   --   --   --   BILITOT 0.8  --   --   --   --   PROT 6.8  --   --   --   --   ALBUMIN 3.1*   < > 2.5* 2.3* 2.1*   < > = values in this interval not displayed.   No results for input(s): LIPASE, AMYLASE in the last 168 hours. No results for input(s): AMMONIA in the last 168 hours. CBC: Recent Labs  Lab 03/28/20 0252 03/29/20 0121 03/30/20 0141 03/30/20 0940 03/30/20 1756 03/31/20 0222 03/31/20 0931  WBC 12.6* 10.6* 14.4* 14.4*  --  14.1*  --   NEUTROABS 9.5* 7.8*  --   --   --   --   --   HGB 8.3* 8.7* 7.3* 7.4* 8.0* 7.4* 9.2*  HCT 25.8* 26.7* 23.2* 23.0* 24.2* 23.1* 29.0*  MCV 89.3 88.7 90.6 89.5  --  88.8  --   PLT 179 177 172 194  --  172  --    Cardiac Enzymes: No results for input(s): CKTOTAL, CKMB, CKMBINDEX, TROPONINI in the last 168 hours. CBG: Recent Labs  Lab 03/30/20 1236 03/30/20 1638 03/30/20 2118 03/31/20 0617 03/31/20 1144  GLUCAP 157* 141* 129* 116* 123*    Medications: . [START ON 04/01/2020] cefTAZidime (FORTAZ)  IV    . [START ON 04/01/2020] vancomycin     . aspirin EC  81 mg Oral Daily  . atorvastatin  80 mg Oral Daily  . atropine  1 drop Left Eye Daily  . brimonidine  1 drop Both Eyes Q8H  . Chlorhexidine Gluconate Cloth  6 each Topical Q0600  . [START ON 04/01/2020] cinacalcet  30 mg Oral Q M,W,F-HD  . clopidogrel  75 mg Oral Daily  . dorzolamide-timolol  1 drop Both Eyes BID  . [START ON 04/01/2020] doxercalciferol  3 mcg Intravenous Q M,W,F-HD  . feeding supplement (NEPRO CARB STEADY)  237 mL Oral BID BM  . heparin injection (subcutaneous)  5,000 Units Subcutaneous Q8H  . latanoprost  1 drop Both Eyes QHS  . pantoprazole  40 mg Oral Daily  . prednisoLONE acetate  1 drop Left Eye QID  . sucroferric oxyhydroxide  1,000 mg Oral TID with meals

## 2020-03-31 NOTE — Progress Notes (Signed)
Patient complains of severe pain in right toe. MD notified that available pain medication has been given. Md instructed RN to give IV dilaudid early then keep her on set PRN schedule. PRN IV dilaudid administered.Fuller Canada, RN

## 2020-03-31 NOTE — Consult Note (Signed)
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  Now s/p stent placement. Pressure Injury POA: N/A Measurement:1cm x 0.5cm with dried serum vs eschar obscuring depth. Wound bed:As noted above Drainage (amount, consistency, odor) None Periwound: with faint blue halo surrounding area Dressing procedure/placement/frequency: Patient has been assess post procedure by vascular and they have cleared her for discharge from their perspective. She is to follow in their office in 1 month.  I am asked by Medical team to provide guidance for topical care of the wound and have given nursing guidance for twice daily application of a betadine swabstick to the area after cleansing the foot with soap and water to provide antimicrobial and astringent properties.  Patient to be sent home with swab sticks sufficient for 10 days of BID use and bedside RN to instruct on the application and verify that patient understands the twice daily recommendation for topical care.  [place] nursing team will not follow, but will remain available to this patient, the nursing and medical teams.  Please re-consult if needed. Thanks, Maudie Flakes, MSN, RN, [place], Arther Abbott  Pager# [phone]

## 2020-03-31 NOTE — Progress Notes (Signed)
Blood transfusion complete. Patient tolerated well. VS are WNL. Fuller [place], RN

## 2020-04-01 ENCOUNTER — Telehealth: Payer: Self-pay

## 2020-04-01 ENCOUNTER — Encounter: Payer: Medicare Other | Admitting: Physical Therapy

## 2020-04-01 ENCOUNTER — Encounter (HOSPITAL_COMMUNITY): Payer: Self-pay | Admitting: Vascular Surgery

## 2020-04-01 ENCOUNTER — Other Ambulatory Visit: Payer: Self-pay | Admitting: Student

## 2020-04-01 DIAGNOSIS — I739 Peripheral vascular disease, unspecified: Secondary | ICD-10-CM | POA: Diagnosis not present

## 2020-04-01 DIAGNOSIS — L089 Local infection of the skin and subcutaneous tissue, unspecified: Secondary | ICD-10-CM | POA: Diagnosis not present

## 2020-04-01 DIAGNOSIS — E11621 Type 2 diabetes mellitus with foot ulcer: Secondary | ICD-10-CM | POA: Diagnosis not present

## 2020-04-01 DIAGNOSIS — N186 End stage renal disease: Secondary | ICD-10-CM | POA: Diagnosis not present

## 2020-04-01 LAB — TYPE AND SCREEN
ABO/RH(D): O POS
Antibody Screen: NEGATIVE
Unit division: 0
Unit division: 0

## 2020-04-01 LAB — BPAM RBC
Blood Product Expiration Date: 202204252359
Blood Product Expiration Date: 202204252359
ISSUE DATE / TIME: 202203261308
ISSUE DATE / TIME: 202203270452
Unit Type and Rh: 5100
Unit Type and Rh: 5100

## 2020-04-01 LAB — RENAL FUNCTION PANEL
Albumin: 2.1 g/dL — ABNORMAL LOW (ref 3.5–5.0)
Anion gap: 9 (ref 5–15)
BUN: 22 mg/dL (ref 8–23)
CO2: 30 mmol/L (ref 22–32)
Calcium: 8.3 mg/dL — ABNORMAL LOW (ref 8.9–10.3)
Chloride: 93 mmol/L — ABNORMAL LOW (ref 98–111)
Creatinine, Ser: 4.63 mg/dL — ABNORMAL HIGH (ref 0.44–1.00)
GFR, Estimated: 10 mL/min — ABNORMAL LOW (ref >60)
Glucose, Bld: 105 mg/dL — ABNORMAL HIGH (ref 70–99)
Phosphorus: 4.4 mg/dL (ref 2.5–4.6)
Potassium: 3.5 mmol/L (ref 3.5–5.1)
Sodium: 132 mmol/L — ABNORMAL LOW (ref 135–145)

## 2020-04-01 LAB — CBC
HCT: 27.1 % — ABNORMAL LOW (ref 36.0–46.0)
Hemoglobin: 8.7 g/dL — ABNORMAL LOW (ref 12.0–15.0)
MCH: 28.9 pg (ref 26.0–34.0)
MCHC: 32.1 g/dL (ref 30.0–36.0)
MCV: 90 fL (ref 80.0–100.0)
Platelets: 162 10*3/uL (ref 150–400)
RBC: 3.01 MIL/uL — ABNORMAL LOW (ref 3.87–5.11)
RDW: 14.8 % (ref 11.5–15.5)
WBC: 13.8 10*3/uL — ABNORMAL HIGH (ref 4.0–10.5)
nRBC: 0 % (ref 0.0–0.2)

## 2020-04-01 LAB — GLUCOSE, CAPILLARY
Glucose-Capillary: 95 mg/dL (ref 70–99)
Glucose-Capillary: 152 mg/dL — ABNORMAL HIGH (ref 70–99)

## 2020-04-01 LAB — CULTURE, BLOOD (ROUTINE X 2)
Culture: NO GROWTH
Culture: NO GROWTH

## 2020-04-01 LAB — MAGNESIUM: Magnesium: 1.9 mg/dL (ref 1.7–2.4)

## 2020-04-01 MED ORDER — AMLODIPINE BESYLATE 5 MG PO TABS: 5.0000 mg | ORAL_TABLET | Freq: Every day | ORAL | 0 refills | Status: AC

## 2020-04-01 MED ORDER — HYDROMORPHONE HCL 1 MG/ML IJ SOLN
INTRAMUSCULAR | Status: AC
  Administered 2020-04-01: 1 mg via INTRAVENOUS
  Filled 2020-04-01: qty 1

## 2020-04-01 MED ORDER — ASPIRIN 81 MG PO TBEC: 81.0000 mg | DELAYED_RELEASE_TABLET | Freq: Every day | ORAL | 0 refills | Status: AC

## 2020-04-01 MED ORDER — CARVEDILOL 3.125 MG PO TABS: 3.1250 mg | ORAL_TABLET | Freq: Two times a day (BID) | ORAL | 0 refills | Status: AC

## 2020-04-01 MED ORDER — VANCOMYCIN HCL IN DEXTROSE 750-5 MG/150ML-% IV SOLN: 750.0000 mg | INTRAVENOUS | 0 refills | Status: AC

## 2020-04-01 MED ORDER — HYDROMORPHONE HCL 2 MG PO TABS
ORAL_TABLET | ORAL | Status: AC
  Administered 2020-04-01: 2 mg
  Filled 2020-04-01: qty 1

## 2020-04-01 MED ORDER — ATORVASTATIN CALCIUM 80 MG PO TABS: 80.0000 mg | ORAL_TABLET | Freq: Every day | ORAL | 0 refills | Status: AC

## 2020-04-01 MED ORDER — SODIUM CHLORIDE 0.9 % IV SOLN: 2.0000 g | INTRAVENOUS | 0 refills | Status: AC

## 2020-04-01 MED ORDER — SODIUM CHLORIDE 0.9% FLUSH: 3.0000 mL | INTRAVENOUS | Status: DC | PRN

## 2020-04-01 MED ORDER — VANCOMYCIN HCL IN DEXTROSE 750-5 MG/150ML-% IV SOLN
INTRAVENOUS | Status: AC
  Administered 2020-04-01: 750 mg via INTRAVENOUS
  Filled 2020-04-01: qty 150

## 2020-04-01 MED ORDER — LIDOCAINE HCL (PF) 1 % IJ SOLN: 5.0000 mL | INTRAMUSCULAR | Status: DC | PRN

## 2020-04-01 MED ORDER — LIDOCAINE-PRILOCAINE 2.5-2.5 % EX CREA: 1.0000 "application " | TOPICAL_CREAM | CUTANEOUS | Status: DC | PRN

## 2020-04-01 MED ORDER — HYDROMORPHONE HCL 2 MG PO TABS: 2.0000 mg | ORAL_TABLET | ORAL | 0 refills | Status: AC | PRN

## 2020-04-01 MED ORDER — CLOPIDOGREL BISULFATE 75 MG PO TABS: 75.0000 mg | ORAL_TABLET | Freq: Every day | ORAL | 0 refills | Status: AC

## 2020-04-01 MED ORDER — SODIUM CHLORIDE 0.9 % IV SOLN: 250.0000 mL | INTRAVENOUS | Status: DC | PRN

## 2020-04-01 MED ORDER — DOXERCALCIFEROL 4 MCG/2ML IV SOLN
INTRAVENOUS | Status: AC
  Administered 2020-04-01: 3 ug via INTRAVENOUS
  Filled 2020-04-01: qty 2

## 2020-04-01 MED ORDER — PENTAFLUOROPROP-TETRAFLUOROETH EX AERO: 1.0000 "application " | INHALATION_SPRAY | CUTANEOUS | Status: DC | PRN

## 2020-04-01 MED ORDER — DICLOFENAC SODIUM 1 % EX GEL
2.0000 g | Freq: Once | CUTANEOUS | Status: DC
  Filled 2020-04-01: qty 100

## 2020-04-01 MED ORDER — SODIUM CHLORIDE 0.9% FLUSH: 3.0000 mL | Freq: Two times a day (BID) | INTRAVENOUS | Status: DC

## 2020-04-01 MED ORDER — PANTOPRAZOLE SODIUM 40 MG PO TBEC: 40.0000 mg | DELAYED_RELEASE_TABLET | Freq: Every day | ORAL | 0 refills | Status: AC

## 2020-04-01 MED FILL — Heparin Sodium (Porcine) Inj 1000 Unit/ML: INTRAMUSCULAR | Qty: 10 | Status: AC

## 2020-04-01 MED FILL — ASPIRIN LOW DOSE 81 MG TBEC: 81 | 30 days supply | Qty: 30 | Fill #0

## 2020-04-01 MED FILL — PANTOPRAZOLE SOD DR 40 MG T: 40 | 30 days supply | Qty: 30 | Fill #0

## 2020-04-01 MED FILL — AMLODIPINE BESYLATE 5 MG TA: 5 | 30 days supply | Qty: 30 | Fill #0

## 2020-04-01 MED FILL — CARVEDILOL 3.125 MG TABLET: 3.125 | 30 days supply | Qty: 60 | Fill #0

## 2020-04-01 MED FILL — HYDROmorphone HCL 2 MG TABS: 2 | 5 days supply | Qty: 30 | Fill #0

## 2020-04-01 MED FILL — ATORVASTATIN CALCIUM 80 MG: 80 | 30 days supply | Qty: 30 | Fill #0

## 2020-04-01 NOTE — Progress Notes (Signed)
Occupational Therapy Treatment Patient Details Name: Rebekah Johnson MRN: [id number] DOB: [birth year] Today's Date: 04/01/2020    History of present illness 60 yo female admitted with non healing R foot wound was seen 3/25 for Abdominal aortogram with right lower extremity runoff, angioplasty and stenting of right superficial and popliteal artery, angioplasty right tibioperoneal trunk and posterior tibial artery.  PMHx:  atherosclerosis, CAD with mult PCI, CHF, PAD, COPD, tobacco use, PUD, L BKA, MGUS, DM   OT comments  Pt progressing towards established OT goals. Pt performing partial squat pivot and lateral scoot to drop arm recliner with Supervision. Pt then performing grooming at sink while seated. Pt in pleasant spirits throughout session until very end; elevating BLEs and pt reporting significant pain at RLE/greater toe. Pt becoming very tearful; updated nursing. Continue to recommend dc to home once medically stable per physician and will continue to follow acutely as admitted.     Follow Up Recommendations  No OT follow up    Equipment Recommendations  None recommended by OT    Recommendations for Other Services      Precautions / Restrictions Precautions Precautions: Fall Precaution Comments: fluid restrictions, no prosthetic Required Braces or Orthoses: Other Brace (LLE prosthesis) Other Brace: prosthesis at home Restrictions Other Position/Activity Restrictions: L BKA       Mobility Bed Mobility Overal bed mobility: Modified Independent             General bed mobility comments: pt requires extra time, denies RLE pain    Transfers Overall transfer level: Needs assistance   Transfers: Squat Pivot Transfers;Lateral/Scoot Transfers     Squat pivot transfers: Supervision    Lateral/Scoot Transfers: Supervision General transfer comment: Supervision for safety    Balance Overall balance assessment: Needs assistance Sitting-balance support: Feet  supported;Single extremity supported Sitting balance-Leahy Scale: Good     Standing balance support: Bilateral upper extremity supported;During functional activity Standing balance-Leahy Scale: Poor                             ADL either performed or assessed with clinical judgement   ADL Overall ADL's : Needs assistance/impaired     Grooming: Set up;Sitting;Oral care Grooming Details (indicate cue type and reason): at sink                 Toilet Transfer: Supervision/safety;Transfer board;Squat-pivot (simulated to drop arm recliner) Toilet Transfer Details (indicate cue type and reason): Supervision for pt to perform partial squat pivot and lateral scoot to drop arm recliner.         Functional mobility during ADLs: Min guard General ADL Comments: Transfer to recliner and then performing oral care at sink     Vision       Perception     Praxis      Cognition Arousal/Alertness: Awake/alert Behavior During Therapy: WFL for tasks assessed/performed Overall Cognitive Status: Within Functional Limits for tasks assessed                                 General Comments: Pt in good spirits at begining of session. Laughing and joking with therapist. At end of session, pt reporting increased pain at RLE. Pt becoming very tearful and restless. Notified nursing        Exercises     Shoulder Instructions       General Comments VSS    Pertinent  Vitals/ Pain       Pain Assessment: Faces Faces Pain Scale: Hurts little more Pain Location: R leg Pain Descriptors / Indicators: Tender Pain Intervention(s): Monitored during session;Limited activity within patient's tolerance;Repositioned  Home Living                                          Prior Functioning/Environment              Frequency  Min 2X/week        Progress Toward Goals  OT Goals(current goals can now be found in the care plan section)  Progress  towards OT goals: Progressing toward goals  Acute Rehab OT Goals Patient Stated Goal: return to outpatient therapy OT Goal Formulation: With patient Time For Goal Achievement: 04/13/20 Potential to Achieve Goals: Good  Plan Discharge plan remains appropriate    Co-evaluation                 AM-PAC OT "6 Clicks" Daily Activity     Outcome Measure   Help from another person eating meals?: None Help from another person taking care of personal grooming?: None Help from another person toileting, which includes using toliet, bedpan, or urinal?: A Little Help from another person bathing (including washing, rinsing, drying)?: A Little Help from another person to put on and taking off regular upper body clothing?: None Help from another person to put on and taking off regular lower body clothing?: A Little 6 Click Score: 21    End of Session    OT Visit Diagnosis: Unsteadiness on feet (R26.81);Pain Pain - Right/Left: Right Pain - part of body: Leg   Activity Tolerance Patient tolerated treatment well   Patient Left with call bell/phone within reach;in chair   Nurse Communication Mobility status        Time: 3612-2449 OT Time Calculation (min): 26 min  Charges: OT General Charges $OT Visit: 1 Visit OT Treatments $Self Care/Home Management : 23-37 mins  [place], OTR/L Acute Rehab Pager: [phone] Office: [place] 04/01/2020, 1:49 PM

## 2020-04-01 NOTE — Progress Notes (Addendum)
Physical Therapy Treatment Patient Details Name: Rebekah Johnson MRN: [id number]  DOB: 24-Dec-1960 Today's Date: 04/01/2020    History of Present Illness 60 yo female admitted with non healing R foot wound was seen 3/25 for Abdominal aortogram with right lower extremity runoff, angioplasty and stenting of right superficial and popliteal artery, angioplasty right tibioperoneal trunk and posterior tibial artery. PMHx: atherosclerosis, CAD with mult PCI, CHF, PAD, COPD, tobacco use, PUD, L BKA, MGUS, DM.    PT Comments    Pt received in chair, agreeable to therapy session with encouragement and with fair participation and tolerance for mobility, pt limited due to severe reported RLE pain, and tearful after lateral scoot transfer back into bed. Pt Supervision for scoot transfers but needs up to minA for partial stand, pt RLE too painful to attempt standing balance/gait tasks. VSS on RA. Pt continues to benefit from PT services to progress toward functional mobility goals, pt encouraged to have family bring prosthetic limb in to hospital to assess transfers/gait next session. Continue to recommend HHPT, pending progress.   Follow Up Recommendations  Home health PT;Supervision for mobility/OOB (pending progress)     Equipment Recommendations  None recommended by PT    Recommendations for Other Services       Precautions / Restrictions Precautions Precautions: Fall Precaution Comments: fluid restrictions, no prosthetic Required Braces or Orthoses: Other Brace (LLE prosthesis) Other Brace: prosthesis at home Restrictions Weight Bearing Restrictions: No Other Position/Activity Restrictions: L BKA    Mobility  Bed Mobility Overal bed mobility: Needs Assistance Bed Mobility: Sit to Supine       Sit to supine: Supervision   General bed mobility comments: pt requires extra time, RLE pain limited, no physical assist needed but assist with lines/sheets    Transfers Overall transfer  level: Needs assistance   Transfers: Squat Pivot Transfers;Lateral/Scoot Transfers Sit to Stand: Min assist (partial stand from chair; unable to reach fully upright due to pain)   Squat pivot transfers: Supervision    Lateral/Scoot Transfers: Supervision General transfer comment: Supervision for safety      Balance Overall balance assessment: Needs assistance Sitting-balance support: Feet supported;Single extremity supported Sitting balance-Leahy Scale: Good     Standing balance support: Bilateral upper extremity supported;During functional activity Standing balance-Leahy Scale: Poor Standing balance comment: unable to reach full upright stance due to pain                Cognition Arousal/Alertness: Awake/alert (sleeping initially but easily awoken) Behavior During Therapy: WFL for tasks assessed/performed Overall Cognitive Status: Within Functional Limits for tasks assessed           General Comments: Pt with depressed affect but participatory as able. At end of session, pt reporting increased pain at RLE. Pt becoming very tearful and restless. Notified nursing.      Exercises General Exercises - Lower Extremity Ankle Circles/Pumps: AROM;Right;10 reps;Seated Quad Sets: AROM;Right;5 reps;Seated (reclined in chair) Long Arc Quad: AROM;Right;10 reps;Seated Hip ABduction/ADduction: AROM;Right;5 reps;Supine Hip Flexion/Marching: AROM;Right;5 reps;Seated Other Exercises Other Exercises: instructed on wheelchair push-ups and pt performed one but deferring more due to RLE pain    General Comments General comments (skin integrity, edema, etc.): BP 131/55 (74) seated in chair; HR 80 bpm; SpO2 WNL on RA      Pertinent Vitals/Pain Pain Assessment: Faces Faces Pain Scale: Hurts little more Pain Location: R leg Pain Descriptors / Indicators: Tender Pain Intervention(s): Monitored during session;Premedicated before session;Repositioned;Patient requesting pain meds-RN  notified;Limited activity within patient's tolerance  PT Goals (current goals can now be found in the care plan section) Acute Rehab PT Goals Patient Stated Goal: return to outpatient therapy PT Goal Formulation: With patient Time For Goal Achievement: 04/12/20 Potential to Achieve Goals: Good Progress towards PT goals: Progressing toward goals (slow progress)    Frequency    Min 3X/week      PT Plan Current plan remains appropriate       AM-PAC PT "6 Clicks" Mobility   Outcome Measure  Help needed turning from your back to your side while in a flat bed without using bedrails?: None Help needed moving from lying on your back to sitting on the side of a flat bed without using bedrails?: A Little Help needed moving to and from a bed to a chair (including a wheelchair)?: A Little Help needed standing up from a chair using your arms (e.g., wheelchair or bedside chair)?: A Little Help needed to walk in hospital room?: A Lot Help needed climbing 3-5 steps with a railing? : A Lot 6 Click Score: 17    End of Session Equipment Utilized During Treatment: Gait belt Activity Tolerance: Patient limited by pain Patient left: in bed;with call bell/phone within reach;with bed alarm set (sidelying to R) Nurse Communication: Mobility status;Patient requests pain meds PT Visit Diagnosis: Unsteadiness on feet (R26.81);Muscle weakness (generalized) (M62.81);Difficulty in walking, not elsewhere classified (R26.2);Pain Pain - Right/Left: Right Pain - part of body: Leg     Time: 3244-0102 PT Time Calculation (min) (ACUTE ONLY): 28 min  Charges:  $Therapeutic Exercise: 8-22 mins $Therapeutic Activity: 8-22 mins                     Danah Reinecke P., PTA Acute Rehabilitation Services Pager: [phone] Office: [place] 04/01/2020, 3:50 PM

## 2020-04-01 NOTE — Telephone Encounter (Signed)
Called patient to get an appointment scheduled in 2 weeks with Dr. Baxter Flattery or another provider, per Dr. Baxter Flattery.. Left a voicemail for patient to call us back and get it scheduled

## 2020-04-01 NOTE — Progress Notes (Signed)
Attempted IV insertion and pt became belligerent and using obscenities at this nurse when I attempted to insert her IV. I stopped and asked if she wanted me to proceed and she told me to leave her room. Pt also asked me to give her Dilaudid and when I told her that I cannot do that she cussed at me again and told me to leave her room.

## 2020-04-01 NOTE — Discharge Instructions (Signed)
  [facility]  Discharge Instructions  Lower Extremity Angiogram; Angioplasty/Stenting  Please refer to the following instructions for your post-procedure care. Your surgeon or physician assistant will discuss any changes with you.  Activity  Avoid lifting more than 8 pounds (1 gallons of milk) for 5 days after your procedure. You may walk as much as you can tolerate. It's OK to drive after 72 hours.  Bathing/Showering  You may shower the day after your procedure. If you have a bandage, you may remove it at 24- 48 hours. Clean your incision site with mild soap and water. Pat the area dry with a clean towel.  Diet  Resume your pre-procedure diet. There are no special food restrictions following this procedure. All patients with peripheral vascular disease should follow a low fat/low cholesterol diet. In order to heal from your surgery, it is CRITICAL to get adequate nutrition. Your body requires vitamins, minerals, and protein. Vegetables are the best source of vitamins and minerals. Vegetables also provide the perfect balance of protein. Processed food has little nutritional value, so try to avoid this.  Medications  Resume taking all of your medications unless your doctor tells you not to. If your incision is causing pain, you may take over-the-counter pain relievers such as acetaminophen (Tylenol)  Follow Up  Follow up will be arranged at the time of your procedure. You may have an office visit scheduled or may be scheduled for surgery. Ask your surgeon if you have any questions.  Please call us immediately for any of the following conditions: .Severe or worsening pain your legs or feet at rest or with walking. .Increased pain, redness, drainage at your groin puncture site. .Fever of 101 degrees or higher. .If you have any mild or slow bleeding from your puncture site: lie down, apply firm constant pressure over the area with a piece of gauze or a  clean wash cloth for 30 minutes- no peeking!, call 911 right away if you are still bleeding after 30 minutes, or if the bleeding is heavy and unmanageable.  Reduce your risk factors of vascular disease:  . Stop smoking. If you would like help call [facility] at 1-800-QUIT-NOW (1-800-784-8669) or [place] at 336-586-4000. . Manage your cholesterol . Maintain a desired weight . Control your diabetes . Keep your blood pressure down .  If you have any questions, please call the office at [phone] 

## 2020-04-01 NOTE — Progress Notes (Signed)
HD terminated remaining 60mins as pt requested. PA Zeyfang informed and agree. Vancomycin unable to complete. And ceftazidime not given.

## 2020-04-01 NOTE — Discharge Summary (Signed)
Name: Rebekah Johnson MRN: [id number] DOB: 08-Feb-1960 60 y.o. PCP: Patient, No Pcp Per  Date of Admission: 03/27/2020  2:02 AM Date of Discharge:  04/01/2020 Attending Physician: Dr. Dareen Piano  DISCHARGE DIAGNOSIS:  Principal Problem:   Peripheral vascular disease ([place]) Active Problems:   Essential (primary) hypertension   Type 2 diabetes mellitus ([place])   Diabetic foot ulcer ([place])   ESRD (end stage renal disease) on dialysis ([place])   Right foot infection    DISCHARGE MEDICATIONS:   Allergies as of 04/01/2020   No Known Allergies     Medication List    STOP taking these medications   doxycycline 100 MG tablet Commonly known as: VIBRA-TABS   furosemide 80 MG tablet Commonly known as: LASIX   gabapentin 300 MG capsule Commonly known as: NEURONTIN   glipiZIDE 5 MG tablet Commonly known as: GLUCOTROL   lactulose 10 GM/15ML solution Commonly known as: CHRONULAC   linaclotide 145 MCG Caps capsule Commonly known as: Linzess   oxyCODONE-acetaminophen 5-325 MG tablet Commonly known as: PERCOCET/ROXICET   ranolazine 500 MG 12 hr tablet Commonly known as: RANEXA   silver sulfADIAZINE 1 % cream Commonly known as: SILVADENE   sitaGLIPtin 25 MG tablet Commonly known as: JANUVIA     TAKE these medications   Accu-Chek Aviva device Use as instructed three times daily. What changed: additional instructions   accu-chek softclix lancets Use as instructed three times daily before meals.   amLODipine 5 MG tablet Commonly known as: NORVASC Take 1 tablet (5 mg total) by mouth daily.   aspirin 81 MG EC tablet Take 1 tablet (81 mg total) by mouth daily. What changed: additional instructions   atorvastatin 80 MG tablet Commonly known as: LIPITOR Take 1 tablet (80 mg total) by mouth daily.   atropine 1 % ophthalmic solution Place 1 drop into the left eye daily.   brimonidine 0.2 % ophthalmic solution Commonly known as: ALPHAGAN Place 1 drop into both eyes every  8 (eight) hours.   carvedilol 3.125 MG tablet Commonly known as: COREG Take 1 tablet (3.125 mg total) by mouth 2 (two) times daily. What changed:   medication strength  how much to take   cefTAZidime 2 g in sodium chloride 0.9 % 100 mL Inject 2 g into the vein every Monday, Wednesday, and Friday with hemodialysis.   cholecalciferol 25 MCG (1000 UNIT) tablet Commonly known as: VITAMIN D3 Take 1,000 Units by mouth daily.   clopidogrel 75 MG tablet Commonly known as: PLAVIX Take 1 tablet (75 mg total) by mouth daily.   dorzolamide-timolol 22.3-6.8 MG/ML ophthalmic solution Commonly known as: COSOPT Place 1 drop into both eyes 2 (two) times daily.   Fish Oil 1000 MG Caps Take 1,000 mg by mouth daily.   glucose blood test strip Commonly known as: Accu-Chek Aviva Use as instructed three times daily before meals.   HYDROmorphone 2 MG tablet Commonly known as: DILAUDID Take 1 tablet (2 mg total) by mouth every 4 (four) hours as needed for severe pain.   latanoprost 0.005 % ophthalmic solution Commonly known as: XALATAN Place 1 drop into both eyes at bedtime.   lidocaine-prilocaine cream Commonly known as: EMLA Apply 1 application topically 3 (three) times a week.   nitroGLYCERIN 0.4 MG SL tablet Commonly known as: NITROSTAT Place 1 tablet (0.4 mg total) under the tongue every 5 (five) minutes as needed for chest pain. What changed: Another medication with the same name was removed. Continue taking this medication, and follow  the directions you see here.   pantoprazole 40 MG tablet Commonly known as: PROTONIX Take 1 tablet (40 mg total) by mouth daily.   prednisoLONE acetate 1 % ophthalmic suspension Commonly known as: PRED FORTE Place 1 drop into the left eye 4 (four) times daily.   Vancomycin 750-5 MG/150ML-% Soln Commonly known as: VANCOCIN Inject 150 mLs (750 mg total) into the vein every Monday, Wednesday, and Friday with hemodialysis for 6 doses.   Velphoro  500 MG chewable tablet Generic drug: sucroferric oxyhydroxide Chew 1,000 mg by mouth as directed. Take 2 tablets (1000 mg) TID and Take 2 tablets (1000 mg) with snack   vitamin C 1000 MG tablet Take 1,000 mg by mouth daily.            Discharge Care Instructions  (From admission, onward)         Start     Ordered   04/01/20 0000  Discharge wound care:       Comments: Please see above wound care instructions.   04/01/20 1752          DISPOSITION AND FOLLOW-UP:  Rebekah Johnson was discharged from [facility] in Stable condition.  At the hospital follow up visit please address:  Follow-up Recommendations:  Labs needed at time of follow up: CBC w/ diff, BMP, Magnesium, Phosphorus, CBG monitoring, consider iron studies   Studies needed at time of follow up: EKG, consider colonoscopy/EGD, consider gastric emptying study / abdominal imaging for chronic nausea/vomiting.  A.  RLE Diabetic Foot Wounds complicated by possible osteomyelitis versus cellulitis: Recommendations:  - Continue vancomycin and ceftazidime for 2 weeks MWF with HD - Given 30 tablets PO Dilaudid 2mg  to take q4 hours PRN. Please reassess pain.  - Continue wound care per d/c instructions.  - Needs ID follow up in 2 weeks to assess whether she needs additional ABX.  B. Severe PAD of B/L LE's s/p R SFA/Popliteal artery and R tibeoperoneal trunk and PT artery stenting 03/29/20 : Recommendations:  - Make sure patient taking DAPT - Make sure patient is taking statin - Be sure vascular surgery has scheduled appointment for patient  - Needs to be reevaluated for pain management   C. Frequent PVC's and Prolonged QT: Recommendations:  - Titrate Carvedilol as needed to reduce PCV burden - Get EKG and check electrolytes   D. DM: Recommendations:  - D/c'd diabetes medications as A1c was low. Please be sure she is not taking these. - Continue CBG monitoring to assess need for medications in  the outpatient setting.  E. Normocytic anemia: Recommendations:  - Check CBC make sure patient is not bleeding since starting DAPT - Monitor for GIB vs. LLQ hematoma (site of heparin injections)  - Give Iron or PRBC as needed for Hgb > 8 - Consider colonoscopy for further work up  E. Constipation: Recommendations:  - Patient had relief of constipation with lactulose while in the hospital.  - D/C'd without bowel regimen due to drop in hemoglobin although workup was negative.  - Please consider restarting low dose PO lactulose daily to normalize bowel function   E. Chronic Nausea and vomiting: Recommendations:  -Consider further work up as it is limiting her PO intake - may need work up for gastroparesis or chronic mesenteric ischemia. - She did improve with PPI BID (PPx) and was discharged with PPI once daily. Evaluate need for this. - Get EKG to assess for QTc interval and restart nausea medicine as needed.  Important changes since admission: Was started on DAPT and needs vascular follow up, Started on 2 weeks AB and needs ID follow up.  Follow-up Appointments:  Follow-up Information    Elam Dutch, MD Follow up in 1 month(s).   Specialties: Vascular Surgery, Cardiology Why: the office will contact the patient with their follow up appointment Contact information: [place] [address] [phone]        Maryellen Pile, MD. Schedule an appointment as soon as possible for a visit in 1 week(s).   Why: Please call to schedule an appointment with your Primary Care Provider within the next 1-2 weeks for hospital follow up.  Contact information: [place] [address] [phone]        Carlyle Basques, MD. Schedule an appointment as soon as possible for a visit in 28 day(s).   Specialty: Infectious Diseases Why: Please call to schedule an appointment with the Infectious Disease Clinic here in [employer] within the next 2 weeks (by  04/13/20) to follow up your foot infection. Contact information: [address] [place] [address] [phone]               HOSPITAL COURSE:  Patient Summary: 60 year old female with history of ESRD on HD Monday Wednesday Friday, hypertension, CAD status post multiple PCI, HFpEF, PAD, COPD, tobacco use, peptic ulcer disease, left BKA, MGUS, and diabetes mellitus who is presenting with right great toe ulcer pain.  She was admitted for right lower extremity peripheral artery disease status post drug-eluting stent.  # Cellulitis / Possible Osteomyelitis in setting of Diabetic Foot Wounds of RLE  # Hx of Severe PAD of B/L LE's s/p R SFA/Popliteal artery and R tibeoperoneal trunk and PT artery stenting 03/29/20  Patient presents to [facility] with right lower extremity pain associated with signs and symptoms concerning for severe peripheral artery disease.  MRI was performed which showed changes of the proximal phalanx of the 3rd toe and 2nd metatarsal head as well as mild edema of the 2nd metatarsal of the right foot, concerning for possible osteomyelitis.  Patient was started on IV Zosyn and ultimately switched to vancomycin and ceftazidime. Vascular surgery was consulted due to concern for poor blood flow to her lower extremity.  She had an aortogram showing severe calcific atherosclerosis of the right lower extremity involving the entire right superficial femoral-popliteal and tibial vessels.  There are multiple subtotal occlusions diffusely throughout the right superficial and popliteal artery.  Vascular surgery took her to PCI with stents to the right tibioperoneal trunk and PT artery.  Patient had significant pain in her lower extremity after procedure due to reperfusion injury.  She was started on dual antiplatelet therapy and high-dose statin instructed to follow-up with her outpatient PCP and vascular surgery for further evaluation management.  Infectious diseases  ultimately consulted for length of antibiotics.  She will continue 2 weeks worth of vancomycin and Zosyn dosed with hemodialysis and follow-up with infectious disease in 2 weeks and vascular surgery in 1 month.  Acute on Chronic Normocytic Anemia Patient had acute on chronic normocytic anemia with concern for possible hemorrhage.  She is status post 2 units of PRBCs with stability of her hemoglobin. Due to concern for GI bleed patient was started on a PPI in the hospital , although there were no overt symptoms/signs of GI bleed and FOBT was negative.  Please reevaluate patient for need for discontinuing PPI therapy in the outpatient setting.  Patient  was counseled to follow-up with her PCP to check her CBC within a week and supplement additional blood products as needed.  Patient may also benefit from further evaluation for her bleed with a colonoscopy / EGD.  Consider GI referral if she continues to have ongoing GI symptoms.  Acute on Chronic Constipation  Patient had constipation which she believes is chronic for her.  Her constipation resolved with lactulose in the hospital. This was discontinued on discharge due to drop in hemoglobin and continued small stool. She would likely benefit from outpatient scheduled bowel regimen (especially lactulose).  Chronic nausea/vomiting: Patient experience ongoing nausea and vomiting in the hospital.  She will likely benefit from further evaluation and management of her ongoing upper GI symptoms.  Considering her history of PAD and diabetes she would likely benefit from further evaluation for gastroparesis / possible chronic mesenteric ischemia.  She also need an EKG to further evaluate her QTC and restart nausea medications in the outpatient setting  Ventricular Bigeminy, Resolved  Frequent PVC's Prolonged QTc Interval  Patient had high PVC burden but was asymptomatic throughout her hospital stay.  She is on carvedilol at home for CAD.  She was restarted on  this medication at a much lower dose.  Please reevaluate her need for increased dose of carvedilol.  She also needs her electrolytes reevaluated at this time.  Hx of Diabetes mellitus Patient has a history of type 2 diabetes mellitus.  Her last A1c was 4.7.  She states that she was taking glipizide and sitagliptin at home.  Her hemoglobin A1c was likely low in the setting of her ESRD.  We will discontinue all of her antidiabetic medications.  She will likely need further evaluation and management of her diabetes.  Please consider starting low dose antihyperglycemics in the outpatient as needed, although cautiously with ESRD.   DISCHARGE INSTRUCTIONS:   Discharge Instructions    (HEART FAILURE PATIENTS) Call MD:  Anytime you have any of the following symptoms: 1) 3 pound weight gain in 24 hours or 5 pounds in 1 week 2) shortness of breath, with or without a dry hacking cough 3) swelling in the hands, feet or stomach 4) if you have to sleep on extra pillows at night in order to breathe.   Complete by: As directed    Call MD for:  difficulty breathing, headache or visual disturbances   Complete by: As directed    Call MD for:  extreme fatigue   Complete by: As directed    Call MD for:  persistant dizziness or light-headedness   Complete by: As directed    Call MD for:  persistant nausea and vomiting   Complete by: As directed    Call MD for:  redness, tenderness, or signs of infection (pain, swelling, redness, odor or green/yellow discharge around incision site)   Complete by: As directed    Call MD for:  severe uncontrolled pain   Complete by: As directed    Call MD for:  temperature >100.4   Complete by: As directed    Diet - low sodium heart healthy   Complete by: As directed    Diet Carb Modified   Complete by: As directed    Discharge instructions   Complete by: As directed    Rebekah Johnson,   You were admitted to the hospital due to right foot pain and swelling. You were found  to have a severe infection of your right foot, consistent with cellulitis and possible osteomyelitis. You  were also found to have significant occlusion of the arteries in your right leg that supply your right foot, likely contributing to your pain and impaired wound healing. These arteries were stented.   You did receive 2 blood transfusions while in the hospital for low blood counts, although these have stabilized and you did not have any evidence of active bleeding, including GI bleeding. You were restarted on your home MWF HD sessions.   Upon discharge, please BE SURE TO TAKE: - Aspirin (81mg ) 1 tablet daily  - Plavix 1 tablet daily  - Lipitor 1 tablet daily  - Coreg 3.125mg  1 tablet in the morning, 1 tablet in the afternoon  - Amlodipine 1 tablet daily  - Protonix 1 tablet daily   You will receive a total of 2 weeks of IV antibiotics (Vancomycin and Ceftazidime) with your HD sessions every MWF. It is extremely important that you make every HD session for the next 2 weeks so that you can complete your antibiotic therapy as scheduled.   I have prescribed you a short course of Dilaudid 2mg  that you may take every 4 hours as needed for severe pain. You may also take Tylenol, up to 3g daily for your pain.   Please STOP taking:  - Doxycycline  - Glipizide  - Januvia  - Percocet  - Lasix  - Neurontin  - Ranexa  - Silver sulfadiazine   For now, given lactulose, please discontinue home laxatives, although be sure to reach out to your PCP if you find you are no longer passing gas, have additional dark or bloody stools, or worsening abdominal pain / vomiting. You may continue to take nitroglycerin as needed for chest pain.   Please be sure to call our infectious disease clinic at [phone] to schedule an appointment around 04/13/20 for follow up of your infection to see if you will be needing further antibiotic treatment. Please also be sure to call your PCP to schedule a hospital follow up  appointment within the next few weeks. You will be receiving a call from the vascular surgeons for follow up of your stenting procedure in about 1 month.   Please call your PCP vs. Go to the ED if you experience worsening right foot or leg pain, worsening redness, tenderness, or drainage from your right foot wounds, fevers, chills, worsening abdominal pain or vomiting, dark or bloody stools, chest pain, or any other concerning symptoms.   Wound Care: Please apply betadine swab-stick to wounds on right foot once in the morning and once in the evening.  It was great meeting you and take care!   Discharge wound care:   Complete by: As directed    Please see above wound care instructions.   Increase activity slowly   Complete by: As directed       SUBJECTIVE:   Rebekah Johnson was crying out in pain when we entered the room. She states that her nurse had not been giving her pain medications upon her request. She says her pain is well controlled after she does receive PO dilaudid. She was able to have a small, non foul-smelling bowel movement this morning. She denies any current nausea or vomiting. She says her symptoms have overall improved since admission and she remains eager to go home.  Discharge Vitals:   BP (!) 155/81 (BP Location: Right Arm)   Pulse 82   Temp 98.7 F (37.1 C) (Oral)   Resp 19   Ht 5\' 5"  (1.651 m)  Wt 63.5 kg   LMP  (LMP Unknown)   SpO2 99%   BMI 23.30 kg/m   OBJECTIVE:  Physical Exam:  General: Patient appears very uncomfortable and restless. HENT: MMM.  Respiratory: No longer has an active cough. Breathing comfortably on room air.   Cardiovascular: Rate is normal. Rhythm is NSR with less frequent PVC's. No murmurs, rubs, or gallops. No lower extremity edema. Extremities are warm. DP and PT pulses are readily picked up by doppler ultrasound. MSK: Patient is s/p L BKA. ROM continues to improve in RLE. She does have point tenderness over the plantar aspect of  the 2nd MTP joint and tenderness laterally over her 1st right MTP and distal right lower calf.  Abdominal: There is no abdominal TTP, guarding, or rebound. Bowel sounds intact throughout.  Skin: There is a ~1.5x1cm ulceration over the ball of the ball over the first metatarsal head without active drainage or significant erythema, although there is surrounding darkened tissue consistent with necrosis. There is a tiny, punctate lesion between the base of the right 2nd and 3rd toes. There are multiple punctate, raised, white, tender lesions over patient's right calf and single lesion on the medial right foot.  Psych: Intermittently tearful due to pain. Pleasant and cooperative.  Pertinent Labs, Studies, and Procedures:  CBC Latest Ref Rng & Units 04/01/2020 03/31/2020 03/31/2020  WBC 4.0 - 10.5 K/uL 13.8(H) 15.0(H) -  Hemoglobin 12.0 - 15.0 g/dL 8.7(L) 9.4(L) 9.2(L)  Hematocrit 36.0 - 46.0 % 27.1(L) 28.5(L) 29.0(L)  Platelets 150 - 400 K/uL 162 183 -    CMP Latest Ref Rng & Units 04/01/2020 03/31/2020 03/30/2020  Glucose 70 - 99 mg/dL 105(H) 110(H) 147(H)  BUN 8 - 23 mg/dL 22 18 13   Creatinine 0.44 - 1.00 mg/dL 4.63(H) 3.72(H) 2.35(H)  Sodium 135 - 145 mmol/L 132(L) 133(L) 134(L)  Potassium 3.5 - 5.1 mmol/L 3.5 3.3(L) 3.5  Chloride 98 - 111 mmol/L 93(L) 94(L) 96(L)  CO2 22 - 32 mmol/L 30 28 26   Calcium 8.9 - 10.3 mg/dL 8.3(L) 7.9(L) 8.2(L)  Total Protein 6.5 - 8.1 g/dL - - -  Total Bilirubin 0.3 - 1.2 mg/dL - - -  Alkaline Phos 38 - 126 U/L - - -  AST 15 - 41 U/L - - -  ALT 0 - 44 U/L - - -    DG Chest 1 View  Result Date: 03/27/2020 CLINICAL DATA:  Shortness of breath EXAM: CHEST  1 VIEW COMPARISON:  10/14/2017 FINDINGS: Cardiac shadow is stable. Aortic calcifications are seen. The lungs are well aerated bilaterally. Increased airspace opacity is noted in the right base consistent with atelectasis/early infiltrate. No sizable effusion is noted. No acute bony abnormality is seen. IMPRESSION:  Increased airspace opacity in the right base likely representing atelectasis/early infiltrate. Electronically Signed   By: Inez Catalina M.D.   On: 03/27/2020 09:03   MR FOOT RIGHT WO CONTRAST  Result Date: 03/27/2020 CLINICAL DATA:  Diabetic patient with a skin wound on the medial aspect of the right great toe. EXAM: MRI OF THE RIGHT FOREFOOT WITHOUT CONTRAST TECHNIQUE: Multiplanar, multisequence MR imaging of the right forefoot was performed. No intravenous contrast was administered. COMPARISON:  Plain films right foot today. FINDINGS: Bones/Joint/Cartilage Marrow signal in the great toe and first metatarsal is normal. There is a focus of marrow edema in the medial aspect of the head of the second metatarsal measuring 0.9 cm craniocaudal by 0.4 cm transverse by 0.7 cm long. Also seen is mild edema within  the diaphysis of the second metatarsal extending approximately 1.1 cm long. Also seen of the is edema in the proximal phalanx third toe. Subchondral cyst formation and edema are seen at the fourth and fifth tarsometatarsal joints consistent with degenerative change. No joint effusion. Ligaments Intact. Muscles and Tendons There is some atrophy of intrinsic musculature of the foot and intermediate increased T2 signal in intrinsic musculature most consistent with diabetic myopathy. No intramuscular fluid collection. Soft tissues No abscess is identified. There is subcutaneous edema about the dorsum of the foot. IMPRESSION: Negative for abscess, septic joint or osteomyelitis in the first metatarsal or great toe. Mild edema in the diaphysis of the second metatarsal has an appearance most suggestive of stress change rather than infection. Edema in the proximal phalanx of the third toe and medial aspect of the head of the second metatarsal is nonspecific. It could be due to osteomyelitis but no overlying skin wound is identified. Stress or reactive change are also possible. Subcutaneous edema about the dorsum of the  foot consistent with cellulitis and/or dependent change. Electronically Signed   By: Inge Rise M.D.   On: 03/27/2020 15:54   DG Foot Complete Right  Result Date: 03/27/2020 CLINICAL DATA:  First toe pain, initial encounter EXAM: RIGHT FOOT COMPLETE - 3+ VIEW COMPARISON:  03/26/2020 FINDINGS: No acute fracture or dislocation is noted. Soft tissue wound is noted along the medial aspect of the first toe consistent with the given history. This is slightly more prominent than that seen on prior exam consistent with the recent debridement. No bony erosive changes to suggest osteomyelitis are noted. IMPRESSION: Soft tissue wound consistent with the given clinical history. No acute bony abnormality is noted. Electronically Signed   By: Inez Catalina M.D.   On: 03/27/2020 09:01     Signed: Jeralyn Bennett, MD Internal Medicine Resident, PGY-1 [facility]  Pager: [phone] 6:18 PM, 04/01/2020

## 2020-04-01 NOTE — Progress Notes (Incomplete)
Subjective:   Overnight, Rebekah Johnson ***  Objective:  Vital signs in last 24 hours: Vitals:   03/31/20 1633 03/31/20 2003 03/31/20 2356 04/01/20 0335  BP: (!) 168/76 (!) 146/73 129/86 (!) 160/85  Pulse: 78 81 82 78  Resp: 20 20 20 18   Temp: 97.8 F (36.6 C) 97.8 F (36.6 C) 97.8 F (36.6 C) 99.6 F (37.6 C)  TempSrc: Oral Oral Oral Oral  SpO2: 97% 97% 100% 100%  Weight:    65.2 kg  Height:       General: Patient intermittently appears uncomfortable although is not in acute distress. HENT: MMM.  Respiratory: No longer has an active cough. Breathing comfortably on room air.   Cardiovascular: Rate is normal. Rhythm is NSR although with frequent PVC's. No murmurs, rubs, or gallops. No lower extremity edema. Extremities are warm and well-perfused. MSK: Patient is s/p L BKA. ROM continues to improve in RLE. She does have point tenderness over the plantar aspect of the 2nd MTP joint and tenderness laterally over her 1st right MTP and distal right lower calf.  Abdominal: There is minimal epigastric tenderness to palpation but no guarding or other tenderness to palpation. No palpable masses.  Skin: There is a ~1.5x1cm ulceration over the ball of the ball over the first metatarsal head without active drainage or significant erythema, although there is surrounding darkened tissue consistent with necrosis. There is a tiny, punctate lesion between the base of the right 2nd and 3rd toes. There are multiple punctate, raised, white, tender lesions over patient's right calf and single lesion on the medial right foot.  Psych: Intermittently tearful due to pain. Pleasant and cooperative.    Assessment/Plan:  Active Problems:   Right foot infection  # Cellulitis / Possible Osteomyelitis in setting of Diabetic Foot Wounds of RLE  # Hx of Severe PAD of B/L LE's s/p R SFA/Popliteal artery and R tibeoperoneal trunk and PT artery stenting 03/29/20 R foot MRI showed changes of the proximal phalanx  of the 3rd toe and 2nd metatarsal head as well as mild edema of the 2nd metatarsal of the right foot, concerning for possible osteomyelitis. She remains afebrile with stable leukocytosis. ID evaluated her yesterday and believe her MRI changes may be reactive rather than osteomyelitis. Recommended changing her ABX as below. She does have significant TTP over the 2nd MTP with a lesion just distal to this.   - IV Zosyn changed to vancomycin + ceftazidime  - Plan for a minimum of 2 weeks of therapy, although will reach out to ID to discuss potential of osteomyelitis given exam findings  - Consult placed for wound care  - Encouraged more frequent nursing evaluations of pain  - Continue PO Dilaudid 2mg  q4 hours PRN  - Continue IV Dilaudid 0.5-1mg  q2 hours PRN for breakthrough pain  - Placed order for Narcan IV PRN if concern for opioid overdose - Continue monitoring daily CBC   Acute on Chronic Normocytic Anemia Acute on Chronic Constipation  Chronic N/V Patient noted her last BM was 03/24/20 prior to coming into the hospital although did have a dark, liquid bowel movement yesterday early morning after lactulose was started. She denies a history of GI bleeding. Her last colonoscopy 07/2018 did show mild internal hemorrhoids and a few small-mouthed sigmoid and descending colonic diverticula, although no polyps or other abnormalities. She does not have an EGD in the system. She does not have any other apparent sources of bleeding and FOBT was negative. She received a  a blood transfusion yesterday afternoon with incomplete hgb response, although Hgb improved from 7.4 to 9.2 following 2nd RBC transfusion this morning.   - Will restart ASA, Plavix, and Heparin DVT PPx  - Hold further miralax / lactulose for now  - Hold further miralax/lactulose  - Give 40mg  PO Protonix BID for now  - Will repeat CBC later today  - Will discontinue Zofran for now given very prolonged QTc - Continue close monitoring   ESRD  on HD  Hypokalemia Hypophosphatemia, Resolved  Calciphylaxis, RLE Patient is now back to her home schedule with HD MWF. K+ slightly low this morning at 3.3. On exam, she does have tender calciphylaxis on her RLE despite normal phosphorus levels, likely in the setting of her reduced blood flow.    - Nephrology following, appreciate their recommendation  - Will be scheduled for HD tomorrow  - Given KCl 27mEq PO  - Appreciate nephrology's assistance in electrolyte replacement  - Continue to monitor renal function daily - Renal/carb modified diet w/ fluid restriction   Ventricular Bigeminy, Resolved  Frequent PVC's Prolonged QTc Interval  Patient's QTc interval appears increasingly prolonged with IV Zofran for nausea. She continues to have frequent PVC's. Potassium is low at 3.3 today.   - Continuous telemetry monitoring  - Check morning electrolytes and continue replacement for goal K > 4, Mg > 2 - Avoid QTc prolonging medications as able (Zosyn discontinued)  - Start Coreg 3.125mg  (BID if pressures allow) with hx of CAD   Hx of Diabetes mellitus Patient has sitagliptin 25 mg daily and glipizide 5 mg daily listed in her medication list. Patient does not have these medications with the medications that she brought to the hospital. She states her glipizide has been discontinued. Hgb A1c low at 4.7. Sugars remain within goal without insulin.   - Continue to monitor CBG's  Consults: Vascular and Nephrology have signed off. Appreciate ID's recommendations.  Code status: Full code  DVT PPx: Heparin  Prior to Admission Living Arrangement: Home w/ significant other and  Anticipated Discharge Location: Home w/ [facility] PT for short time before resuming outpatient PT Barriers to Discharge: IV Antibiotics / Further workup   Jeralyn Bennett, MD 04/01/2020, 6:21 AM Pager: [phone] After 5pm on weekdays and 1pm on weekends: On Call pager [phone]

## 2020-04-01 NOTE — Progress Notes (Signed)
Patient to discharge home with roommate. Dialysis complete today. Prescriptions picked up from pharmacy and given to patient. Discharge instructions including how to clean and paint right foot wound with betadine reviewed with patient. She denied questions and verbalized understanding. VS stable and patient is stable to d/c.Fuller Canada, RN

## 2020-04-01 NOTE — Progress Notes (Signed)
Subjective:  Seen in room. States she is feeling better. No specific complaints but wants to leave the hospital   Objective Vital signs in last 24 hours: Vitals:   03/31/20 2003 03/31/20 2356 04/01/20 0335 04/01/20 0806  BP: (!) 146/73 129/86 (!) 160/85 (!) 152/80  Pulse: 81 82 78 82  Resp: 20 20 18 17   Temp: 97.8 F (36.6 C) 97.8 F (36.6 C) 99.6 F (37.6 C) 99.4 F (37.4 C)  TempSrc: Oral Oral Oral Oral  SpO2: 97% 100% 100% 100%  Weight:   65.2 kg   Height:       Weight change: -0.1 kg  Physical Exam: General: chronically ill-appearing female, nad  Heart: normal rate, no murmurs Lungs: bilateral chest rise w/ no iwob Abdomen: Soft non-tender. BS +  Extremities: L BKA, no stump edema; RLE wound dry without drainage Dialysis Access: LUE AVF +bruit   OP HD: [facility] MWF   3h 20min  64kg   3K/2.25 bath  Hep none  LUA AVF  - hect 3 tiw  - mircera 60 q2 last 3/12  - venofer 100 x 5 to start next hd    Assessment/Plan: 1. PAD /Non healing R foot wound --IV antibiotics started per primary.  Noted to have severe RLE arterial disease. S/p aortogram with angioplasty and stenting to right superficial femoral-popliteal and tibial vessels on 3/25. Per primary team.  2. ESRD -HD MWF schedule.  3. Hypertension/volume  - BP elevated on admission. Wt's not accurate. Vol stable on exam 4. Anemia of ESRD-Hgb 8.7.  Continue ESA - Gave Aranesp 60 with HD 3/25. Hold IV iron with infection 5. Metabolic bone disease -VELPHORO as binder/Hectorol on HD also Sensipar on dialysis 6. Diabetes mellitus Type 2  7. CAD 8. Dispo - stable for d/c from renal standpoint   Reesa Chew  04/01/2020, 10:28 AM    Labs: Basic Metabolic Panel: Recent Labs  Lab 03/30/20 0141 03/31/20 0222 04/01/20 0106  NA 134* 133* 132*  K 3.5 3.3* 3.5  CL 96* 94* 93*  CO2 26 28 30   GLUCOSE 147* 110* 105*  BUN 13 18 22   CREATININE 2.35* 3.72* 4.63*  CALCIUM 8.2* 7.9* 8.3*  PHOS 3.6 3.4 4.4    Liver Function Tests: Recent Labs  Lab 03/27/20 0235 03/28/20 0252 03/30/20 0141 03/31/20 0222 04/01/20 0106  AST 20  --   --   --   --   ALT 26  --   --   --   --   ALKPHOS 81  --   --   --   --   BILITOT 0.8  --   --   --   --   PROT 6.8  --   --   --   --   ALBUMIN 3.1*   < > 2.3* 2.1* 2.1*   < > = values in this interval not displayed.   No results for input(s): LIPASE, AMYLASE in the last 168 hours. No results for input(s): AMMONIA in the last 168 hours. CBC: Recent Labs  Lab 03/28/20 0252 03/29/20 0121 03/30/20 0141 03/30/20 0940 03/30/20 1756 03/31/20 0222 03/31/20 0931 03/31/20 1810 04/01/20 0106  WBC 12.6* 10.6* 14.4* 14.4*  --  14.1*  --  15.0* 13.8*  NEUTROABS 9.5* 7.8*  --   --   --   --   --   --   --   HGB 8.3* 8.7* 7.3* 7.4*   < > 7.4* 9.2* 9.4* 8.7*  HCT  25.8* 26.7* 23.2* 23.0*   < > 23.1* 29.0* 28.5* 27.1*  MCV 89.3 88.7 90.6 89.5  --  88.8  --  89.1 90.0  PLT 179 177 172 194  --  172  --  183 162   < > = values in this interval not displayed.   Cardiac Enzymes: No results for input(s): CKTOTAL, CKMB, CKMBINDEX, TROPONINI in the last 168 hours. CBG: Recent Labs  Lab 03/31/20 0617 03/31/20 1144 03/31/20 1636 03/31/20 2136 04/01/20 0613  GLUCAP 116* 123* 99 103* 95    Medications: . cefTAZidime (FORTAZ)  IV    . vancomycin     . aspirin EC  81 mg Oral Daily  . atorvastatin  80 mg Oral Daily  . atropine  1 drop Left Eye Daily  . brimonidine  1 drop Both Eyes Q8H  . carvedilol  3.125 mg Oral BID WC  . Chlorhexidine Gluconate Cloth  6 each Topical Q0600  . cinacalcet  30 mg Oral Q M,W,F-HD  . clopidogrel  75 mg Oral Daily  . dorzolamide-timolol  1 drop Both Eyes BID  . doxercalciferol  3 mcg Intravenous Q M,W,F-HD  . feeding supplement (NEPRO CARB STEADY)  237 mL Oral BID BM  . heparin injection (subcutaneous)  5,000 Units Subcutaneous Q8H  . latanoprost  1 drop Both Eyes QHS  . pantoprazole  40 mg Oral BID  . prednisoLONE acetate   1 drop Left Eye QID  . sucroferric oxyhydroxide  1,000 mg Oral TID with meals

## 2020-04-02 ENCOUNTER — Encounter: Payer: Medicare Other | Admitting: Physical Therapy

## 2020-04-02 ENCOUNTER — Ambulatory Visit: Payer: Medicare Other | Admitting: Physician Assistant

## 2020-04-02 DIAGNOSIS — L03115 Cellulitis of right lower limb: Secondary | ICD-10-CM | POA: Insufficient documentation

## 2020-04-03 ENCOUNTER — Telehealth: Payer: Self-pay | Admitting: Nephrology

## 2020-04-03 ENCOUNTER — Encounter: Payer: Medicare Other | Admitting: Physical Therapy

## 2020-04-03 NOTE — Telephone Encounter (Signed)
Transition of Care Contact from [place]   Date of Discharge: 04/01/20 Date of Contact: 04/03/20 Method of contact: phone Talked to patient   Patient contacted to discuss transition of care form recent hospitaliztion. Patient was admitted to [facility] from 03/27/20 to 04/01/20 with the discharge diagnosis of RLE diabetic foot wounds with possible osteomyelitis/cellulitis.    Medication changes were reviewed - provided verbal review of changes and sent hard copy to dialysis center to be given to patient.   Patient will follow up with is outpatient dialysis center this afternoon.   Other follow up needs include follow visit scheduled with [facility] and ID.   Jen Mow, PA-C [facility] Pager: [phone]

## 2020-04-04 ENCOUNTER — Encounter: Payer: Medicare Other | Admitting: Physical Therapy

## 2020-04-08 ENCOUNTER — Encounter: Payer: Medicare Other | Admitting: Physical Therapy

## 2020-04-08 ENCOUNTER — Telehealth: Payer: Self-pay | Admitting: Family

## 2020-04-08 NOTE — Telephone Encounter (Signed)
Great. Let me look over to see if can give orals to see if she would be more compliant that way.

## 2020-04-08 NOTE — Telephone Encounter (Signed)
Ms. Connon is being treated for diabetic foot wound with vancomycin and ceftazidime with dialysis. Nephrology our office today Ms. Siddall is missing treatment refusing to stay for antibiotics and has received 0/6 doses of medication thus far.  Terri Piedra, NP 04/08/2020 1:54 PM

## 2020-04-09 ENCOUNTER — Encounter: Payer: Medicare Other | Admitting: Physical Therapy

## 2020-04-10 ENCOUNTER — Encounter: Payer: Medicare Other | Admitting: Physical Therapy

## 2020-04-11 ENCOUNTER — Encounter: Payer: Medicare Other | Admitting: Physical Therapy

## 2020-04-12 ENCOUNTER — Ambulatory Visit: Payer: Medicare Other | Admitting: Vascular Surgery

## 2020-04-15 ENCOUNTER — Encounter: Payer: Medicare Other | Admitting: Physical Therapy

## 2020-04-16 ENCOUNTER — Ambulatory Visit: Payer: Medicare Other

## 2020-04-16 ENCOUNTER — Ambulatory Visit (INDEPENDENT_AMBULATORY_CARE_PROVIDER_SITE_OTHER): Payer: Medicare Other | Admitting: Orthopedic Surgery

## 2020-04-16 DIAGNOSIS — Z89512 Acquired absence of left leg below knee: Secondary | ICD-10-CM

## 2020-04-16 MED ORDER — PREDNISONE 10 MG PO TABS: 10.0000 mg | ORAL_TABLET | Freq: Every day | ORAL | 0 refills | Status: AC

## 2020-04-17 ENCOUNTER — Ambulatory Visit: Payer: Medicare Other

## 2020-04-17 ENCOUNTER — Encounter: Payer: Medicare Other | Admitting: Physical Therapy

## 2020-04-22 ENCOUNTER — Encounter: Payer: Medicare Other | Admitting: Physical Therapy

## 2020-04-24 ENCOUNTER — Encounter: Payer: Medicare Other | Admitting: Physical Therapy

## 2020-04-25 ENCOUNTER — Encounter: Payer: Self-pay | Admitting: Orthopedic Surgery

## 2020-04-25 NOTE — Progress Notes (Signed)
Office Visit Note   Patient: Rebekah Johnson           Date of Birth: 06/03/1960           MRN: [id number] Visit Date: 04/16/2020              Requested by: No referring provider defined for this encounter. PCP: Patient, No Pcp Per (Inactive)  Chief Complaint  Patient presents with  . Right Foot - Follow-up      HPI: Patient is a 60 year old woman who was seen for evaluation for both lower extremities she is status post a left below the knee amputation and has peripheral vascular disease with a ischemic ulcer of the medial right great toe.  Was scheduled for an appointment with vascular vein surgery today however she was unaware that she had this appointment.  Assessment & Plan: Visit Diagnoses:  1. Left below-knee amputee [facility])     Plan:  Patient will reschedule her appointment with vascular vein surgery.  Recommend that she resume her doxycycline.  Patient requested a prescription for oxycodone she just received a prescription for Dilaudid so this was not filled.  Patient is given a postoperative shoe.  Follow-Up Instructions: Return in about 2 weeks (around 04/30/2020).   Ortho Exam  Patient is alert, oriented, no adenopathy, well-dressed, normal affect, normal respiratory effort. Examination patient has a stable left transtibial amputation.  She has an ischemic ulcer over the medial right great toe no ascending cellulitis no drainage no exposed bone or tendon.  Imaging: No results found. No images are attached to the encounter.  Labs: Lab Results  Component Value Date   HGBA1C 4.7 (L) 03/27/2020   HGBA1C 6.8 (H) 10/11/2019   HGBA1C 6.9 (A) 02/02/2019   ESRSEDRATE 57 (H) 07/13/2015   CRP 0.7 07/13/2015   LABURIC 5.7 07/13/2015   REPTSTATUS 04/01/2020 FINAL 03/27/2020   CULT  03/27/2020    NO GROWTH 5 DAYS Performed at [facility], [place] [address] [place], [place] [address]      Lab Results  Component Value Date   ALBUMIN 2.1 (L) 04/01/2020    ALBUMIN 2.1 (L) 03/31/2020   ALBUMIN 2.3 (L) 03/30/2020    Lab Results  Component Value Date   MG 1.9 04/01/2020   MG 2.0 03/30/2020   MG 1.9 03/28/2020   No results found for: VD25OH  No results found for: PREALBUMIN CBC EXTENDED Latest Ref Rng & Units 04/01/2020 03/31/2020 03/31/2020  WBC 4.0 - 10.5 K/uL 13.8(H) 15.0(H) -  RBC 3.87 - 5.11 MIL/uL 3.01(L) 3.20(L) -  HGB 12.0 - 15.0 g/dL 8.7(L) 9.4(L) 9.2(L)  HCT 36.0 - 46.0 % 27.1(L) 28.5(L) 29.0(L)  PLT 150 - 400 K/uL 162 183 -  NEUTROABS 1.7 - 7.7 K/uL - - -  LYMPHSABS 0.7 - 4.0 K/uL - - -     There is no height or weight on file to calculate BMI.  Orders:  No orders of the defined types were placed in this encounter.  Meds ordered this encounter  Medications  . predniSONE (DELTASONE) 10 MG tablet    Sig: Take 1 tablet (10 mg total) by mouth daily with breakfast.    Dispense:  30 tablet    Refill:  0     Procedures: No procedures performed  Clinical Data: No additional findings.  ROS:  All other systems negative, except as noted in the HPI. Review of Systems  Objective: Vital Signs: LMP  (LMP Unknown)   Specialty Comments:  No specialty comments available.  PMFS History: Patient Active Problem List   Diagnosis Date Noted  . Cellulitis of right lower limb 04/02/2020  . Right foot infection 03/27/2020  . Pain in right toe(s) 03/27/2020  . Acute pulmonary edema ([place]) 03/08/2020  . Left below-knee amputee ([place]) 10/24/2019  . Pyogenic inflammation of bone ([place])   . Non-pressure chronic ulcer of other part of left foot limited to breakdown of skin ([place])   . Osteomyelitis of foot, acute ([place]) 10/11/2019  . Generalized abdominal pain 10/11/2019  . Other specified diseases of anus and rectum 10/11/2019  . Cutaneous abscess of left foot 10/04/2019  . Allergy, unspecified, initial encounter 08/07/2019  . Anaphylactic shock, unspecified, initial encounter 08/07/2019  . Constipation 08/04/2019  . Nausea  without vomiting 08/04/2019  . Mild protein-calorie malnutrition ([place]) 11/29/2018  . Anemia in chronic kidney disease 10/04/2018  . Coagulation defect, unspecified ([place]) 10/04/2018  . Iron deficiency anemia, unspecified 10/04/2018  . Secondary hyperparathyroidism of renal origin ([place]) 10/04/2018  . Multinodular goiter 08/31/2018  . History of influenza 01/08/2018  . Cardiac arrest ([place]) 11/22/2017  . DOE (dyspnea on exertion) 09/01/2017  . COPD GOLD  0 08/31/2017  . Diabetic polyneuropathy associated with type 2 diabetes mellitus ([place]) 08/19/2017  . ESRD (end stage renal disease) on dialysis ([place]) 03/31/2017  . Neovascular glaucoma of right eye, moderate stage 03/31/2017  . NSTEMI (non-ST elevated myocardial infarction) ([place]) 01/01/2017  . Chest pain 01/01/2017  . Tobacco abuse   . PVD (peripheral vascular disease) ([place])   . Proteinuria   . Obesity   . Normocytic anemia   . Hypertension   . High cholesterol   . Foot amputation status   . Diabetes mellitus with neuropathy ([place])   . CKD (chronic kidney disease), stage IV ([place])   . Chronic diastolic CHF (congestive heart failure) ([place])   . CAD (coronary artery disease) 10/27/2016  . Chronic kidney disease 09/23/2016  . Acute congestive heart failure ([place])   . Hypertensive heart disease with heart failure ([place])   . Chronic combined systolic and diastolic heart failure ([place]) 09/14/2016  . Onychomycosis 12/24/2015  . Status post transmetatarsal amputation of foot, left ([place]) 09/06/2015  . Diabetic foot ulcer ([place]) 07/13/2015  . Cellulitis 07/12/2015  . HLD (hyperlipidemia) 09/02/2012  . Peripheral nerve disease 09/02/2012  . Type 2 diabetes mellitus with peripheral angiopathy ([place]) 09/02/2012  . Hypertriglyceridemia 07/28/2012  . Peripheral vascular disease ([place]) 07/28/2012  . Avitaminosis D 07/28/2012  . Essential (primary) hypertension 06/10/2012  . History of biliary T-tube placement 06/10/2012  . Leg pain 06/10/2012  .  Current tobacco use 06/10/2012  . Atrophic vaginitis 06/07/2012  . Type 2 diabetes mellitus ([place]) 06/07/2012   Past Medical History:  Diagnosis Date  . Acute congestive heart failure ([place])   . Acute diastolic CHF (congestive heart failure) ([place]) 09/14/2016  . Atrophic vaginitis 06/07/2012  . Avitaminosis D 07/28/2012  . CAD (coronary artery disease)    a. NSTEM 09/2016: cath showing severe diffuse disease of the RCA, ramus and Cx with mild-mod disease of LAD; PCI would require multiple stents and significant contrast usage thus medical therapy recommended.  . Cellulitis 07/12/2015  . Chest pain 01/01/2017  . Chronic diastolic CHF (congestive heart failure) ([place])   . Chronic kidney disease 09/23/2016  . CKD (chronic kidney disease), stage IV ([place])   . Constipation   . COPD GOLD  0 08/31/2017   Quit smoking 07/17/17 - Spirometry 09/01/2017  FEV1 1.23 (58%)  Ratio 57 with mild curvature p no rx - 09/01/2017  After extensive coaching inhaler device,  effectiveness =    75% with elipta with cough provoked  - PFT's  10/14/2017  FEV1 1.69 (68 % ) ratio 84  p no % improvement from saba p nothing prior to study with DLCO  50 % corrects to 76  % for alv volume   - 10/14/2017  Walked RA x 3 laps @ 1  . Current tobacco use 06/10/2012  . Diabetes mellitus with neuropathy ([place])   . Diabetic foot ulcer ([place]) 07/13/2015  . Diabetic polyneuropathy associated with type 2 diabetes mellitus ([place]) 08/19/2017  . DOE (dyspnea on exertion) 09/01/2017   09/01/2017  Walked RA x 3 laps @ 185 ft each stopped due to  End of study, nl to mod fast  pace, no  desat   But stopped to rest x 2  Spirometry 09/01/2017  FEV1 1.23 (58%)  Ratio 57  > trial of anoro    . Dyspnea    with exertion  . ESRD (end stage renal disease) ([place])    MWF - [facility]  . Essential (primary) hypertension 06/10/2012  . Foot amputation status    a. h/o left foot transmetatarsal amputation  . Headache    Migraines  . High cholesterol   . History of biliary  T-tube placement 06/10/2012   Patient unaware   . History of cardiac arrest   . HLD (hyperlipidemia) 09/02/2012   Overview:  ICD-10 cut over    . Hypertension    a. patent renal arteries by PV angio 08/2015. b. heavy proteinuria 09/2016 ? nephrotic.  [name] Hypertensive heart disease with heart failure ([place])   . Hypertriglyceridemia 07/28/2012  . Leg pain 06/10/2012  . Normocytic anemia    pt denies this dx  . NSTEMI (non-ST elevated myocardial infarction) ([place]) 01/01/2017  . Obesity   . Onychomycosis 12/24/2015  . Peripheral nerve disease 09/02/2012   legs  . Peripheral vascular disease ([place]) 07/28/2012  . Pneumonia 2018  . Proteinuria   . PVD (peripheral vascular disease) ([place])    a. status post left SFA and popliteal stent and left SFA PTA with drug coated balloon in 08/2015.  . Status post transmetatarsal amputation of foot, left ([place]) 09/06/2015  . Tobacco abuse    quit cigarettes 2019, smokes marijuana  . Type 2 diabetes mellitus ([place]) 06/07/2012  . Type 2 diabetes mellitus with peripheral angiopathy ([place]) 09/02/2012    Family History  Problem Relation Age of Onset  . Diabetes Mother   . Hypertension Father   . Colon cancer Neg Hx   . Stomach cancer Neg Hx   . Pancreatic cancer Neg Hx     Past Surgical History:  Procedure Laterality Date  . ABDOMINAL AORTOGRAM W/LOWER EXTREMITY Left 09/25/2019   Procedure: ABDOMINAL AORTOGRAM W/LOWER EXTREMITY;  Surgeon: Waynetta Sandy, MD;  Location: [facility];  Service: Cardiovascular;  Laterality: Left;  . ABDOMINAL AORTOGRAM W/LOWER EXTREMITY N/A 03/29/2020   Procedure: ABDOMINAL AORTOGRAM W/LOWER EXTREMITY;  Surgeon: Elam Dutch, MD;  Location: [facility];  Service: Cardiovascular;  Laterality: N/A;  . ABDOMINAL HYSTERECTOMY    . AMPUTATION Left 09/06/2015   Procedure: LEFT TRANSMETATARSAL AMPUTATTION;  Surgeon: Newt Minion, MD;  Location: [facility];  Service: Orthopedics;  Laterality: Left;  . AMPUTATION Left  10/18/2019   Procedure: LEFT BELOW KNEE AMPUTATION;  Surgeon: Newt Minion, MD;  Location: [facility];  Service: Orthopedics;  Laterality: Left;  . AV FISTULA PLACEMENT Left 12/13/2018   Procedure: ARTERIOVENOUS (AV) FISTULA CREATION LEFT UPPER ARM;  Surgeon: Waynetta Sandy, MD;  Location: [facility];  Service: Vascular;  Laterality: Left;  . BASCILIC VEIN TRANSPOSITION Left 03/30/2019   Procedure: LEFT ARM ARTERIOVENOUS VEIN TRANSPOSITION;  Surgeon: Serafina Mitchell, MD;  Location: [facility];  Service: Vascular;  Laterality: Left;  . COLONOSCOPY     polyps x 1  . LEFT HEART CATH AND CORONARY ANGIOGRAPHY N/A 09/17/2016   Procedure: LEFT HEART CATH AND CORONARY ANGIOGRAPHY;  Surgeon: Jettie Booze, MD;  Location: [facility];  Service: Cardiovascular;  Laterality: N/A;  . PERIPHERAL VASCULAR ATHERECTOMY Left 09/25/2019   Procedure: PERIPHERAL VASCULAR ATHERECTOMY;  Surgeon: Waynetta Sandy, MD;  Location: [facility];  Service: Cardiovascular;  Laterality: Left;  SFA  . PERIPHERAL VASCULAR BALLOON ANGIOPLASTY Right 03/29/2020   Procedure: PERIPHERAL VASCULAR BALLOON ANGIOPLASTY;  Surgeon: Elam Dutch, MD;  Location: [facility];  Service: Cardiovascular;  Laterality: Right;  peroneal  . PERIPHERAL VASCULAR CATHETERIZATION N/A 08/16/2015   Procedure: Abdominal Aortogram;  Surgeon: Elam Dutch, MD;  Location: [facility];  Service: Cardiovascular;  Laterality: N/A;  . PERIPHERAL VASCULAR CATHETERIZATION Bilateral 08/16/2015   Procedure: Lower Extremity Angiography;  Surgeon: Elam Dutch, MD;  Location: [facility];  Service: Cardiovascular;  Laterality: Bilateral;  . PERIPHERAL VASCULAR CATHETERIZATION Left 08/16/2015   Procedure: Peripheral Vascular Intervention;  Surgeon: Elam Dutch, MD;  Location: [facility];  Service: Cardiovascular;  Laterality: Left;  SFA STENT X 2  . PERIPHERAL VASCULAR INTERVENTION Left 09/25/2019    Procedure: PERIPHERAL VASCULAR INTERVENTION;  Surgeon: Waynetta Sandy, MD;  Location: [facility];  Service: Cardiovascular;  Laterality: Left;  SFA  . PERIPHERAL VASCULAR INTERVENTION Right 03/29/2020   Procedure: PERIPHERAL VASCULAR INTERVENTION;  Surgeon: Elam Dutch, MD;  Location: [facility];  Service: Cardiovascular;  Laterality: Right;  superficial femoral   Social History   Occupational History  . Not on file  Tobacco Use  . Smoking status: Former Smoker    Packs/day: 1.00    Years: 42.00    Pack years: 42.00    Types: Cigarettes    Quit date: 07/17/2017    Years since quitting: 2.7  . Smokeless tobacco: Never Used  Vaping Use  . Vaping Use: Never used  Substance and Sexual Activity  . Alcohol use: Not Currently    Alcohol/week: 1.0 - 2.0 standard drink    Types: 1 - 2 Glasses of wine per week    Comment: on social occassions  . Drug use: Yes    Frequency: 7.0 times per week    Types: Marijuana    Comment: daily use - last use 03/28/19  . Sexual activity: Not on file    Comment: Hysterectomy

## 2020-04-29 ENCOUNTER — Encounter: Payer: Medicare Other | Admitting: Physical Therapy

## 2020-04-30 ENCOUNTER — Ambulatory Visit (INDEPENDENT_AMBULATORY_CARE_PROVIDER_SITE_OTHER): Payer: Medicare Other | Admitting: Physician Assistant

## 2020-04-30 ENCOUNTER — Encounter: Payer: Self-pay | Admitting: Orthopedic Surgery

## 2020-04-30 ENCOUNTER — Ambulatory Visit: Payer: Medicare Other

## 2020-04-30 DIAGNOSIS — M79671 Pain in right foot: Secondary | ICD-10-CM

## 2020-04-30 NOTE — Progress Notes (Signed)
Office Visit Note   Patient: Rebekah Johnson           Date of Birth: 16-May-1960           MRN: [id number] Visit Date: 04/30/2020              Requested by: No referring provider defined for this encounter. PCP: Patient, No Pcp Per (Inactive)  Chief Complaint  Patient presents with  . Right Foot - Follow-up      HPI: Patient presents in follow-up today for her right foot medial gangrenous ulcer.  At her last visit a referral was done for vein and vascular.  Apparently she did not show for this appointment.  Her appointment was rescheduled for today and she has yet to hear from them for follow-up time.  She feels her foot is about the same  Assessment & Plan: Visit Diagnoses: No diagnosis found.  Plan: Patient will go forward with vein and vascular and follow-up with [place] in 2 weeks.We have verified an appointment for 05/17/2020  Follow-Up Instructions: No follow-ups on file.   Ortho Exam  Patient is alert, oriented, no adenopathy, well-dressed, normal affect, normal respiratory effort. Right foot no ascending cellulitis no drainage but she does have a medial painful gangrenous ulcer over the medial toe.  No foul odor no ascending cellulitis she will not let me debride any of the dead skin around this today  Imaging: No results found. No images are attached to the encounter.  Labs: Lab Results  Component Value Date   HGBA1C 4.7 (L) 03/27/2020   HGBA1C 6.8 (H) 10/11/2019   HGBA1C 6.9 (A) 02/02/2019   ESRSEDRATE 57 (H) 07/13/2015   CRP 0.7 07/13/2015   LABURIC 5.7 07/13/2015   REPTSTATUS 04/01/2020 FINAL 03/27/2020   CULT  03/27/2020    NO GROWTH 5 DAYS Performed at [facility], [place] [address], [place], [place] [address]      Lab Results  Component Value Date   ALBUMIN 2.1 (L) 04/01/2020   ALBUMIN 2.1 (L) 03/31/2020   ALBUMIN 2.3 (L) 03/30/2020    Lab Results  Component Value Date   MG 1.9 04/01/2020   MG 2.0 03/30/2020   MG 1.9 03/28/2020   No  results found for: VD25OH  No results found for: PREALBUMIN CBC EXTENDED Latest Ref Rng & Units 04/01/2020 03/31/2020 03/31/2020  WBC 4.0 - 10.5 K/uL 13.8(H) 15.0(H) -  RBC 3.87 - 5.11 MIL/uL 3.01(L) 3.20(L) -  HGB 12.0 - 15.0 g/dL 8.7(L) 9.4(L) 9.2(L)  HCT 36.0 - 46.0 % 27.1(L) 28.5(L) 29.0(L)  PLT 150 - 400 K/uL 162 183 -  NEUTROABS 1.7 - 7.7 K/uL - - -  LYMPHSABS 0.7 - 4.0 K/uL - - -     There is no height or weight on file to calculate BMI.  Orders:  No orders of the defined types were placed in this encounter.  No orders of the defined types were placed in this encounter.    Procedures: No procedures performed  Clinical Data: No additional findings.  ROS:  All other systems negative, except as noted in the HPI. Review of Systems  Objective: Vital Signs: LMP  (LMP Unknown)   Specialty Comments:  No specialty comments available.  PMFS History: Patient Active Problem List   Diagnosis Date Noted  . Cellulitis of right lower limb 04/02/2020  . Right foot infection 03/27/2020  . Pain in right toe(s) 03/27/2020  . Acute pulmonary edema ([place]) 03/08/2020  . Left below-knee  amputee ([place]) 10/24/2019  . Pyogenic inflammation of bone ([place])   . Non-pressure chronic ulcer of other part of left foot limited to breakdown of skin ([place])   . Osteomyelitis of foot, acute ([place]) 10/11/2019  . Generalized abdominal pain 10/11/2019  . Other specified diseases of anus and rectum 10/11/2019  . Cutaneous abscess of left foot 10/04/2019  . Allergy, unspecified, initial encounter 08/07/2019  . Anaphylactic shock, unspecified, initial encounter 08/07/2019  . Constipation 08/04/2019  . Nausea without vomiting 08/04/2019  . Mild protein-calorie malnutrition ([place]) 11/29/2018  . Anemia in chronic kidney disease 10/04/2018  . Coagulation defect, unspecified ([place]) 10/04/2018  . Iron deficiency anemia, unspecified 10/04/2018  . Secondary hyperparathyroidism of renal origin ([place]) 10/04/2018   . Multinodular goiter 08/31/2018  . History of influenza 01/08/2018  . Cardiac arrest ([place]) 11/22/2017  . DOE (dyspnea on exertion) 09/01/2017  . COPD GOLD  0 08/31/2017  . Diabetic polyneuropathy associated with type 2 diabetes mellitus ([place]) 08/19/2017  . ESRD (end stage renal disease) on dialysis ([place]) 03/31/2017  . Neovascular glaucoma of right eye, moderate stage 03/31/2017  . NSTEMI (non-ST elevated myocardial infarction) ([place]) 01/01/2017  . Chest pain 01/01/2017  . Tobacco abuse   . PVD (peripheral vascular disease) ([place])   . Proteinuria   . Obesity   . Normocytic anemia   . Hypertension   . High cholesterol   . Foot amputation status   . Diabetes mellitus with neuropathy ([place])   . CKD (chronic kidney disease), stage IV ([place])   . Chronic diastolic CHF (congestive heart failure) ([place])   . CAD (coronary artery disease) 10/27/2016  . Chronic kidney disease 09/23/2016  . Acute congestive heart failure ([place])   . Hypertensive heart disease with heart failure ([place])   . Chronic combined systolic and diastolic heart failure ([place]) 09/14/2016  . Onychomycosis 12/24/2015  . Status post transmetatarsal amputation of foot, left ([place]) 09/06/2015  . Diabetic foot ulcer (Eugenio Saenz) 07/13/2015  . Cellulitis 07/12/2015  . HLD (hyperlipidemia) 09/02/2012  . Peripheral nerve disease 09/02/2012  . Type 2 diabetes mellitus with peripheral angiopathy ([place]) 09/02/2012  . Hypertriglyceridemia 07/28/2012  . Peripheral vascular disease ([place]) 07/28/2012  . Avitaminosis D 07/28/2012  . Essential (primary) hypertension 06/10/2012  . History of biliary T-tube placement 06/10/2012  . Leg pain 06/10/2012  . Current tobacco use 06/10/2012  . Atrophic vaginitis 06/07/2012  . Type 2 diabetes mellitus ([place]) 06/07/2012   Past Medical History:  Diagnosis Date  . Acute congestive heart failure ([place])   . Acute diastolic CHF (congestive heart failure) (Millard) 09/14/2016  . Atrophic vaginitis 06/07/2012  .  Avitaminosis D 07/28/2012  . CAD (coronary artery disease)    a. NSTEM 09/2016: cath showing severe diffuse disease of the RCA, ramus and Cx with mild-mod disease of LAD; PCI would require multiple stents and significant contrast usage thus medical therapy recommended.  . Cellulitis 07/12/2015  . Chest pain 01/01/2017  . Chronic diastolic CHF (congestive heart failure) ([place])   . Chronic kidney disease 09/23/2016  . CKD (chronic kidney disease), stage IV ([place])   . Constipation   . COPD GOLD  0 08/31/2017   Quit smoking 07/17/17 - Spirometry 09/01/2017  FEV1 1.23 (58%)  Ratio 57 with mild curvature p no rx - 09/01/2017  After extensive coaching inhaler device,  effectiveness =    75% with elipta with cough provoked  - PFT's  10/14/2017  FEV1 1.69 (68 % ) ratio 84  p no % improvement from  saba p nothing prior to study with DLCO  50 % corrects to 76  % for alv volume   - 10/14/2017  Walked RA x 3 laps @ 1  . Current tobacco use 06/10/2012  . Diabetes mellitus with neuropathy ([place])   . Diabetic foot ulcer ([place]) 07/13/2015  . Diabetic polyneuropathy associated with type 2 diabetes mellitus ([place]au[place]) 08/19/2017  . DOE (dyspnea on exertion) 09/01/2017   09/01/2017  Walked RA x 3 laps @ 185 ft each stopped due to  End of study, nl to mod fast  pace, no  desat   But stopped to rest x 2  Spirometry 09/01/2017  FEV1 1.23 (58%)  Ratio 57  > trial of anoro    . Dyspnea    with exertion  . ESRD (end stage renal disease) ([place])    MWF - [facility]  . Essential (primary) hypertension 06/10/2012  . Foot amputation status    a. h/o left foot transmetatarsal amputation  . Headache    Migraines  . High cholesterol   . History of biliary T-tube placement 06/10/2012   Patient unaware   . History of cardiac arrest   . HLD (hyperlipidemia) 09/02/2012   Overview:  ICD-10 cut over    . Hypertension    a. patent renal arteries by PV angio 08/2015. b. heavy proteinuria 09/2016 ? nephrotic.  [name] Hypertensive heart disease with heart  failure ([place])   . Hypertriglyceridemia 07/28/2012  . Leg pain 06/10/2012  . Normocytic anemia    pt denies this dx  . NSTEMI (non-ST elevated myocardial infarction) (Holcomb) 01/01/2017  . Obesity   . Onychomycosis 12/24/2015  . Peripheral nerve disease 09/02/2012   legs  . Peripheral vascular disease (M[place]) 07/28/2012  . Pneumonia 2018  . Proteinuria   . PVD (peripheral vascular disease) ([place])    a. status post left SFA and popliteal stent and left SFA PTA with drug coated balloon in 08/2015.  . Status post transmetatarsal amputation of foot, left ([place]) 09/06/2015  . Tobacco abuse    quit cigarettes 2019, smokes marijuana  . Type 2 diabetes mellitus (Dexter) 06/07/2012  . Type 2 diabetes mellitus with peripheral angiopathy ([place]) 09/02/2012    Family History  Problem Relation Age of Onset  . Diabetes Mother   . Hypertension Father   . Colon cancer Neg Hx   . Stomach cancer Neg Hx   . Pancreatic cancer Neg Hx     Past Surgical History:  Procedure Laterality Date  . ABDOMINAL AORTOGRAM W/LOWER EXTREMITY Left 09/25/2019   Procedure: ABDOMINAL AORTOGRAM W/LOWER EXTREMITY;  Surgeon: Waynetta Sandy, MD;  Location: [facility];  Service: Cardiovascular;  Laterality: Left;  . ABDOMINAL AORTOGRAM W/LOWER EXTREMITY N/A 03/29/2020   Procedure: ABDOMINAL AORTOGRAM W/LOWER EXTREMITY;  Surgeon: Elam Dutch, MD;  Location: [facility];  Service: Cardiovascular;  Laterality: N/A;  . ABDOMINAL HYSTERECTOMY    . AMPUTATION Left 09/06/2015   Procedure: LEFT TRANSMETATARSAL AMPUTATTION;  Surgeon: Newt Minion, MD;  Location: [facility];  Service: Orthopedics;  Laterality: Left;  . AMPUTATION Left 10/18/2019   Procedure: LEFT BELOW KNEE AMPUTATION;  Surgeon: Newt Minion, MD;  Location: [facility];  Service: Orthopedics;  Laterality: Left;  . AV FISTULA PLACEMENT Left 12/13/2018   Procedure: ARTERIOVENOUS (AV) FISTULA CREATION LEFT UPPER ARM;  Surgeon: Waynetta Sandy, MD;  Location: [facility];  Service: Vascular;  Laterality: Left;  . BASCILIC VEIN TRANSPOSITION Left 03/30/2019   Procedure: LEFT  ARM ARTERIOVENOUS VEIN TRANSPOSITION;  Surgeon: Serafina Mitchell, MD;  Location: [facility];  Service: Vascular;  Laterality: Left;  . COLONOSCOPY     polyps x 1  . LEFT HEART CATH AND CORONARY ANGIOGRAPHY N/A 09/17/2016   Procedure: LEFT HEART CATH AND CORONARY ANGIOGRAPHY;  Surgeon: Jettie Booze, MD;  Location: [facility];  Service: Cardiovascular;  Laterality: N/A;  . PERIPHERAL VASCULAR ATHERECTOMY Left 09/25/2019   Procedure: PERIPHERAL VASCULAR ATHERECTOMY;  Surgeon: Waynetta Sandy, MD;  Location: [facility];  Service: Cardiovascular;  Laterality: Left;  SFA  . PERIPHERAL VASCULAR BALLOON ANGIOPLASTY Right 03/29/2020   Procedure: PERIPHERAL VASCULAR BALLOON ANGIOPLASTY;  Surgeon: Elam Dutch, MD;  Location: [facility];  Service: Cardiovascular;  Laterality: Right;  peroneal  . PERIPHERAL VASCULAR CATHETERIZATION N/A 08/16/2015   Procedure: Abdominal Aortogram;  Surgeon: Elam Dutch, MD;  Location: [facility];  Service: Cardiovascular;  Laterality: N/A;  . PERIPHERAL VASCULAR CATHETERIZATION Bilateral 08/16/2015   Procedure: Lower Extremity Angiography;  Surgeon: Elam Dutch, MD;  Location: [facility];  Service: Cardiovascular;  Laterality: Bilateral;  . PERIPHERAL VASCULAR CATHETERIZATION Left 08/16/2015   Procedure: Peripheral Vascular Intervention;  Surgeon: Elam Dutch, MD;  Location: [facility];  Service: Cardiovascular;  Laterality: Left;  SFA STENT X 2  . PERIPHERAL VASCULAR INTERVENTION Left 09/25/2019   Procedure: PERIPHERAL VASCULAR INTERVENTION;  Surgeon: Waynetta Sandy, MD;  Location: [facility];  Service: Cardiovascular;  Laterality: Left;  SFA  . PERIPHERAL VASCULAR INTERVENTION Right 03/29/2020   Procedure: PERIPHERAL VASCULAR INTERVENTION;  Surgeon: Elam Dutch, MD;   Location: [facility];  Service: Cardiovascular;  Laterality: Right;  superficial femoral   Social History   Occupational History  . Not on file  Tobacco Use  . Smoking status: Former Smoker    Packs/day: 1.00    Years: 42.00    Pack years: 42.00    Types: Cigarettes    Quit date: 07/17/2017    Years since quitting: 2.7  . Smokeless tobacco: Never Used  Vaping Use  . Vaping Use: Never used  Substance and Sexual Activity  . Alcohol use: Not Currently    Alcohol/week: 1.0 - 2.0 standard drink    Types: 1 - 2 Glasses of wine per week    Comment: on social occassions  . Drug use: Yes    Frequency: 7.0 times per week    Types: Marijuana    Comment: daily use - last use 03/28/19  . Sexual activity: Not on file    Comment: Hysterectomy

## 2020-05-01 ENCOUNTER — Encounter: Payer: Medicare Other | Admitting: Physical Therapy

## 2020-05-05 DEATH — deceased

## 2020-05-06 ENCOUNTER — Encounter: Payer: Medicare Other | Admitting: Physical Therapy

## 2020-05-08 ENCOUNTER — Encounter: Payer: Medicare Other | Admitting: Physical Therapy

## 2020-05-13 ENCOUNTER — Encounter: Payer: Medicare Other | Admitting: Physical Therapy

## 2020-05-15 ENCOUNTER — Encounter: Payer: Medicare Other | Admitting: Physical Therapy

## 2020-05-17 ENCOUNTER — Ambulatory Visit: Payer: Medicare Other | Admitting: Vascular Surgery

## 2020-05-21 ENCOUNTER — Ambulatory Visit: Payer: Medicare Other | Admitting: Physician Assistant

## 2021-04-05 IMAGING — DX DG ABDOMEN 1V
1 series · 1 of 1 positions shown · non-contrast
Comparison: None.

CLINICAL DATA: Abdominal pain.

EXAM:
ABDOMEN - 1 VIEW

[abdomen kub]
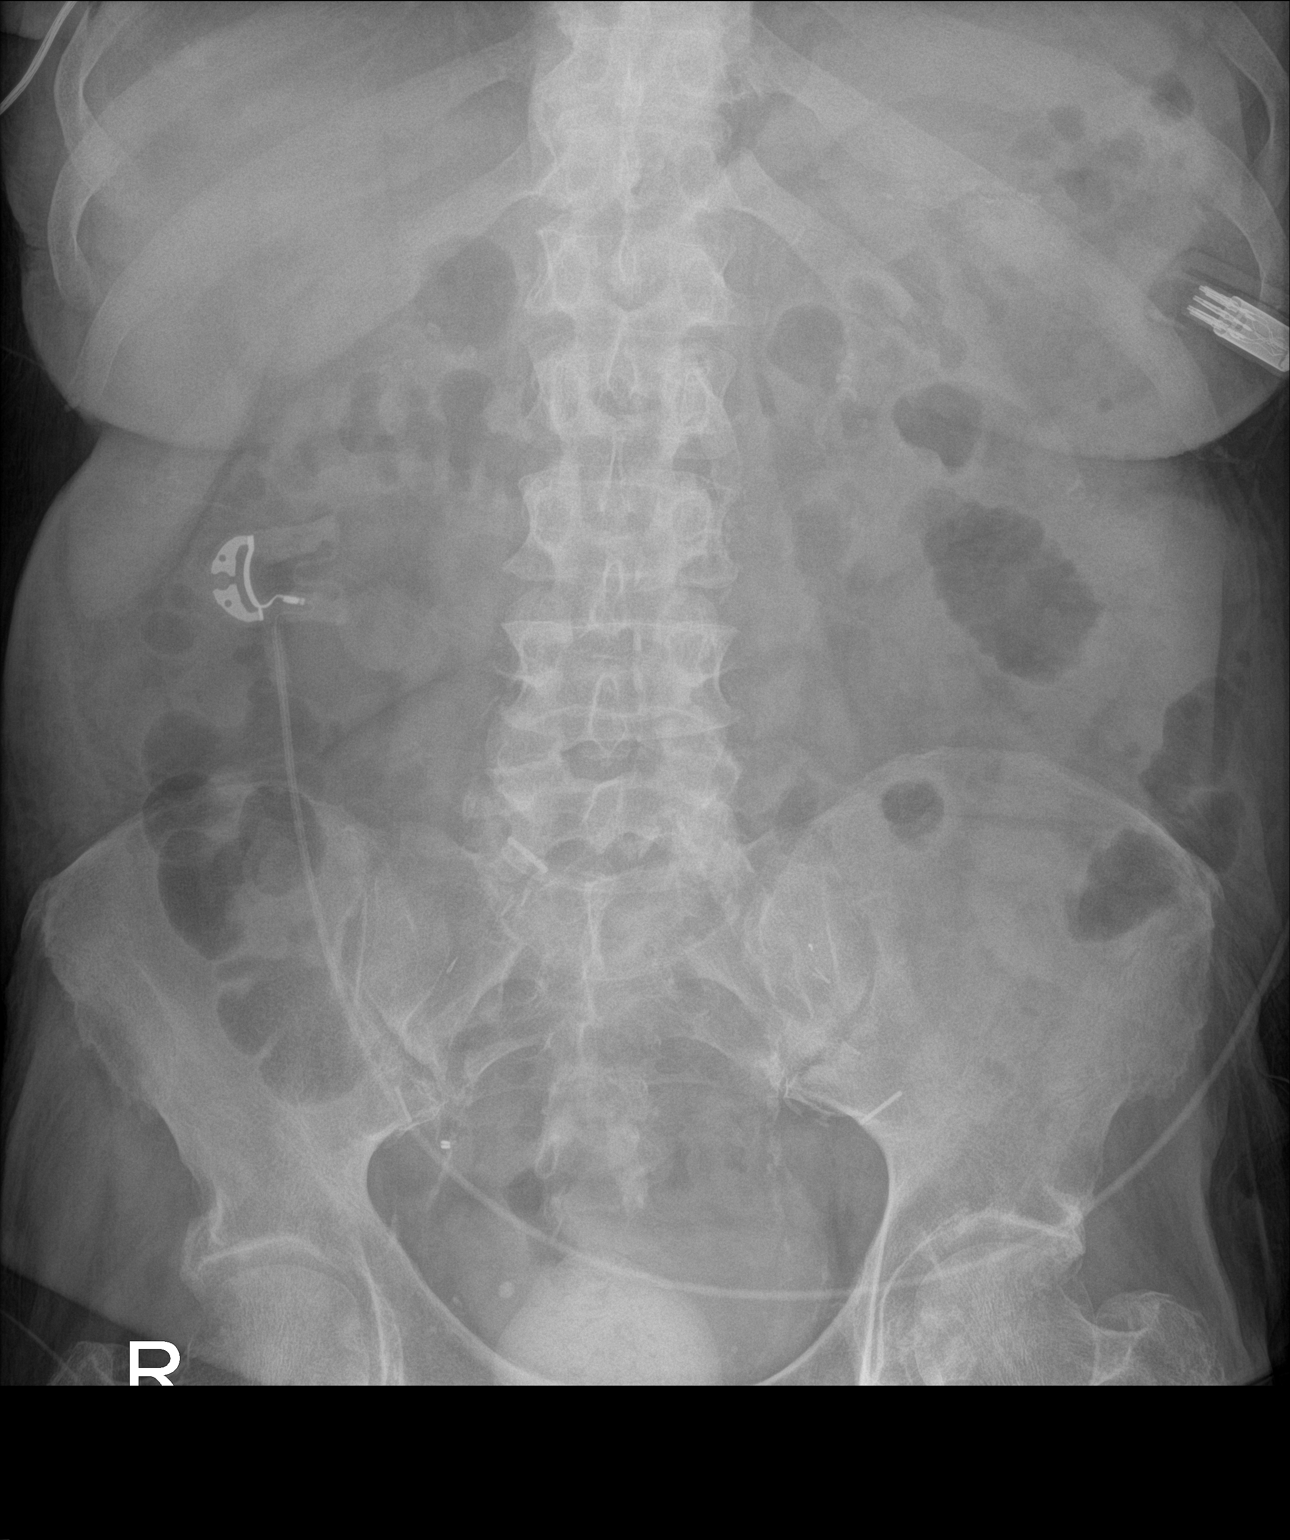

[1 of 1 positions shown; findings below may reference images not displayed]

FINDINGS: Gas is demonstrated within nondilated loops of large and small bowel
in a nonobstructed pattern. Contrast within the rectum. Supine
evaluation limited for the detection of free intraperitoneal air.
Lumbar spine degenerative changes.
IMPRESSION: Nonobstructed bowel gas pattern.

## 2021-09-16 IMAGING — MR MR FOOT*R* W/O CM
4 of 7 series · 19 of 40 positions shown · non-contrast
Comparison: Plain films right foot today.

CLINICAL DATA: Diabetic patient with a skin wound on the medial
aspect of the right great toe.

EXAM:
MRI OF THE RIGHT FOREFOOT WITHOUT CONTRAST
TECHNIQUE: Multiplanar, multisequence MR imaging of the right forefoot was
performed. No intravenous contrast was administered.

[Series 3: T2 fat-sat · axial · 3.0mm · 0.31mm/px · z∈[-95,+4]mm · 6 of 34 slices shown (1 of 2)]
[im 1/34]
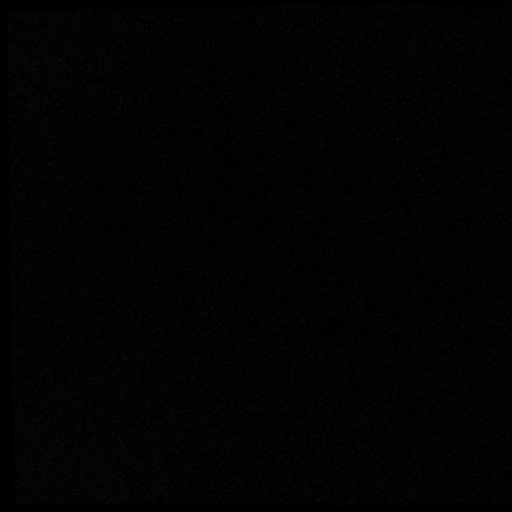
[im 7/34]
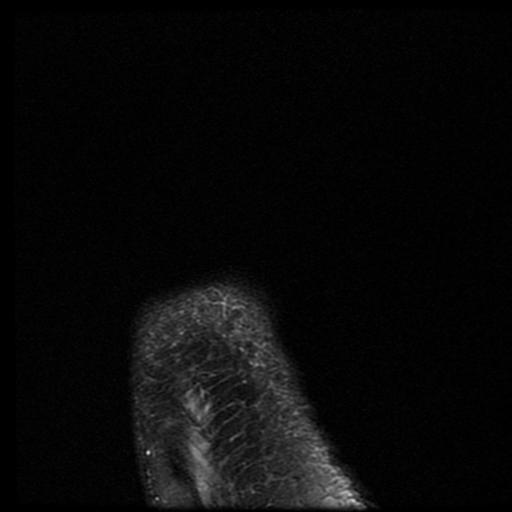
[im 14/34]
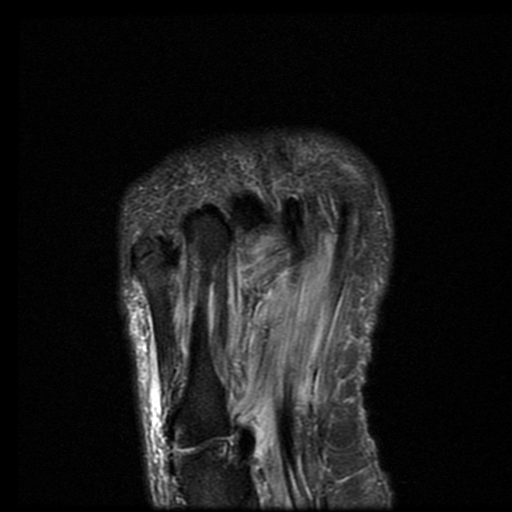
[im 20/34]
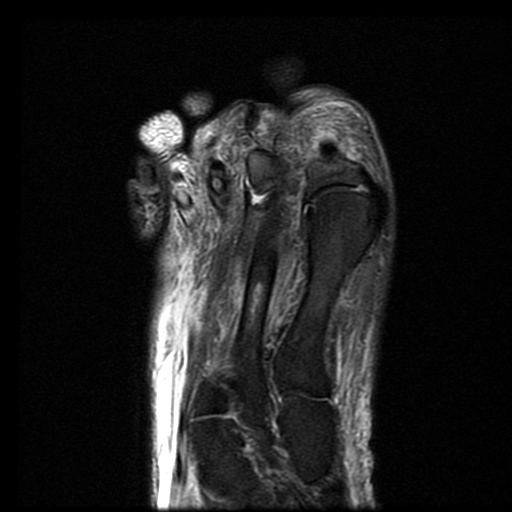
[im 27/34]
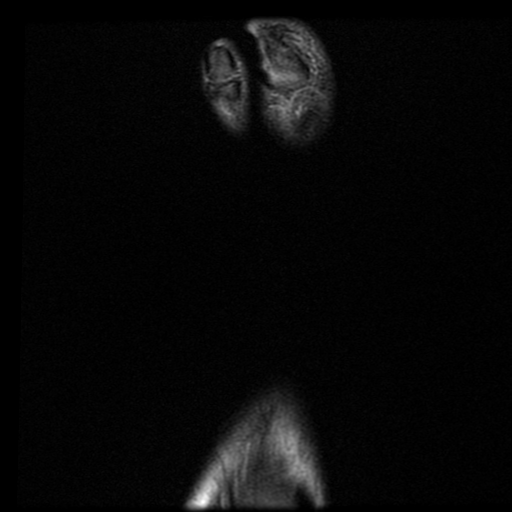
[im 34/34]
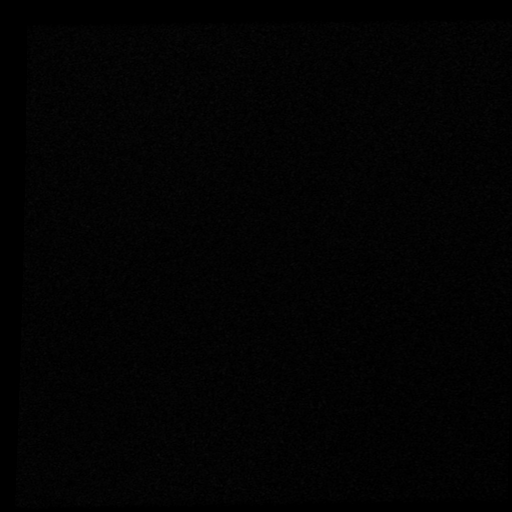

[Series 4: T1 · axial · 3.0mm · 0.31mm/px · z∈[-95,+4]mm · 4 of 26 slices shown (1 of 2)]
[im 1/26]
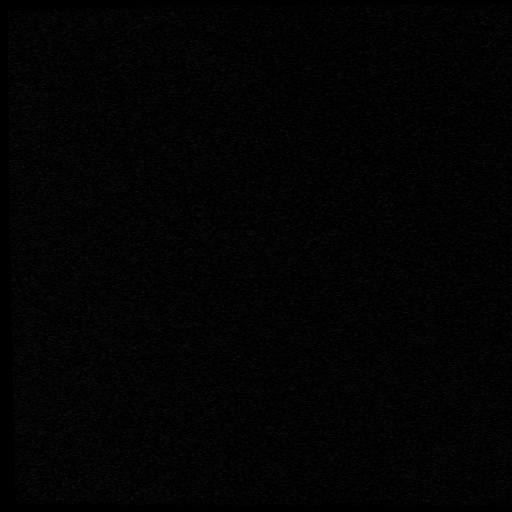
[im 7/26]
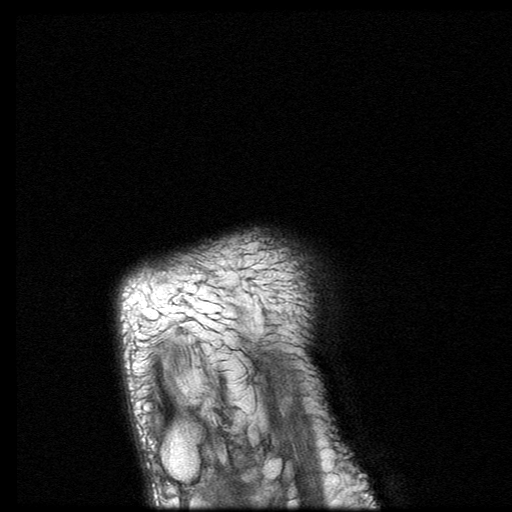
[im 13/26]
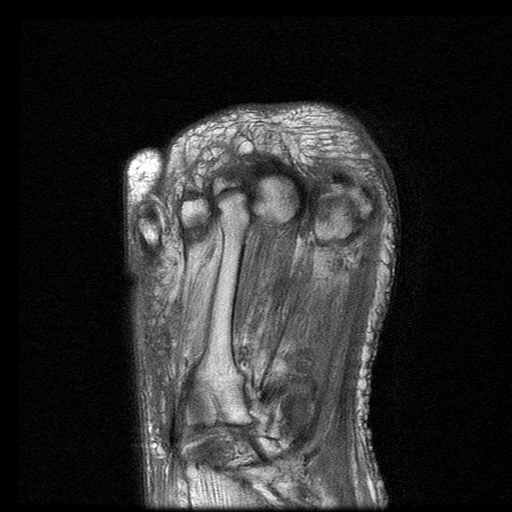
[im 26/26]
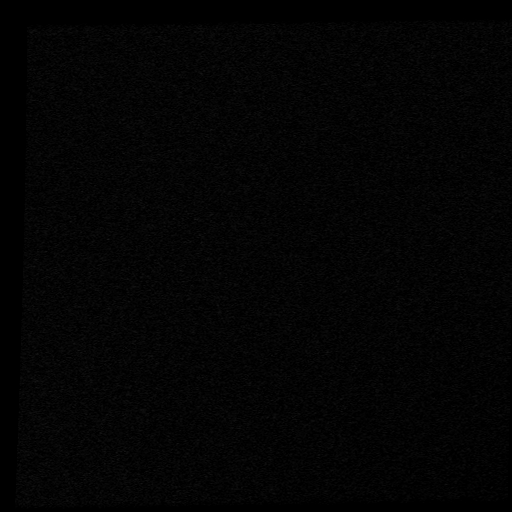

[Series 5: T1 · coronal · 5.0mm · 0.29mm/px · 3 of 29 slices shown (2 of 2)]
[im 6/29]
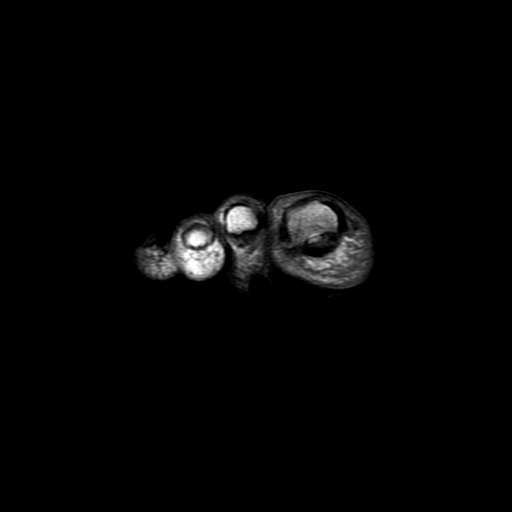
[im 17/29]
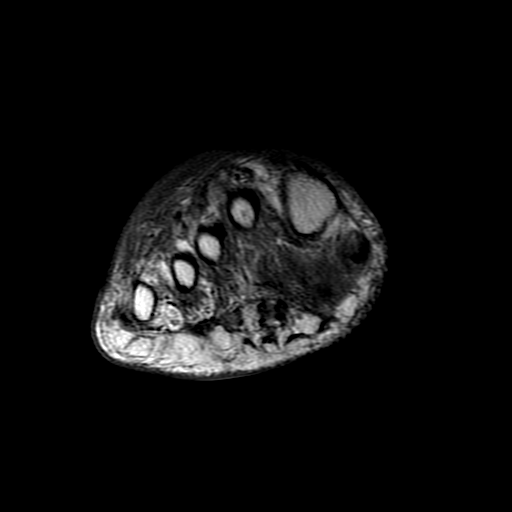
[im 29/29]
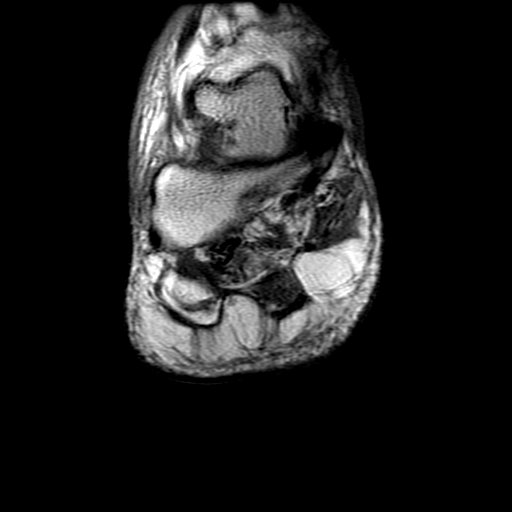

[Series 6: T2 fat-sat · coronal · 5.0mm · 0.29mm/px · 6 of 29 slices shown (2 of 2)]
[im 1/29]
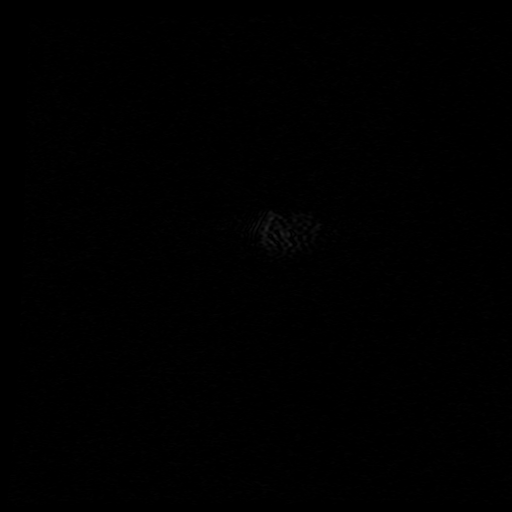
[im 6/29]
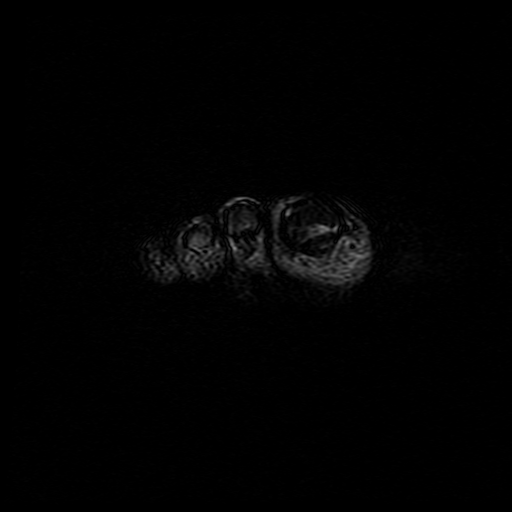
[im 12/29]
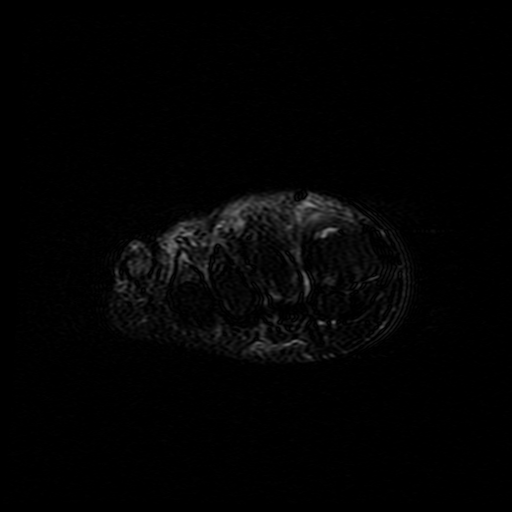
[im 17/29]
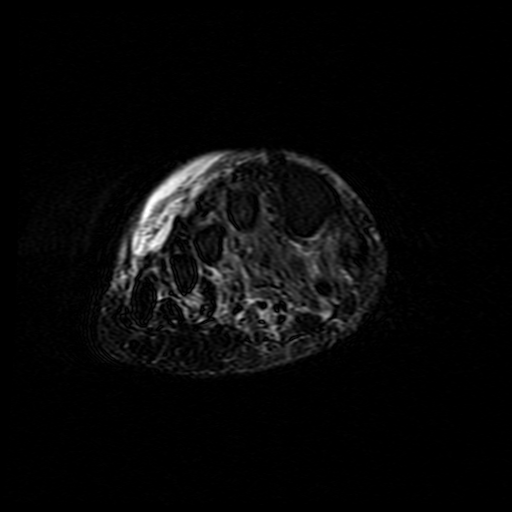
[im 23/29]
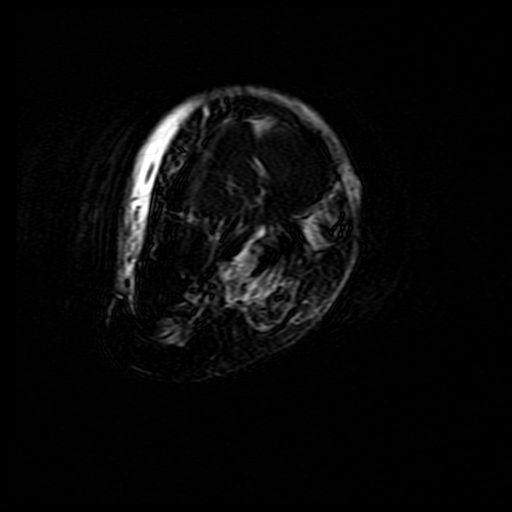
[im 29/29]
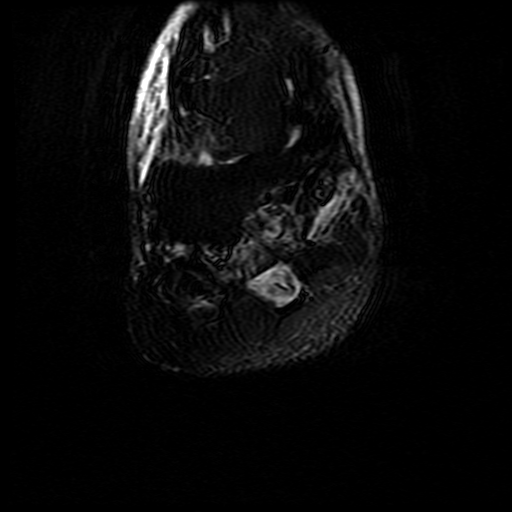

[19 of 40 positions shown; findings below may reference images not displayed]

FINDINGS: Bones/Joint/Cartilage

Marrow signal in the great toe and first metatarsal is normal. There
is a focus of marrow edema in the medial aspect of the head of the
second metatarsal measuring 0.9 cm craniocaudal by 0.4 cm transverse
by 0.7 cm long. Also seen is mild edema within the diaphysis of the
second metatarsal extending approximately 1.1 cm long. Also seen of
the is edema in the proximal phalanx third toe. Subchondral cyst
formation and edema are seen at the fourth and fifth tarsometatarsal
joints consistent with degenerative change. No joint effusion.

Ligaments

Intact.

Muscles and Tendons

There is some atrophy of intrinsic musculature of the foot and
intermediate increased T2 signal in intrinsic musculature most
consistent with diabetic myopathy. No intramuscular fluid
collection.

Soft tissues

No abscess is identified. There is subcutaneous edema about the
dorsum of the foot.
IMPRESSION: Negative for abscess, septic joint or osteomyelitis in the first
metatarsal or great toe.

Mild edema in the diaphysis of the second metatarsal has an
appearance most suggestive of stress change rather than infection.

Edema in the proximal phalanx of the third toe and medial aspect of
the head of the second metatarsal is nonspecific. It could be due to
osteomyelitis but no overlying skin wound is identified. Stress or
reactive change are also possible.

Subcutaneous edema about the dorsum of the foot consistent with
cellulitis and/or dependent change.

## 2021-09-16 IMAGING — CR DG CHEST 1V
1 series · 1 of 1 positions shown · non-contrast
Comparison: 10/14/2017

CLINICAL DATA: Shortness of breath

EXAM:
CHEST  1 VIEW

[chest ap]
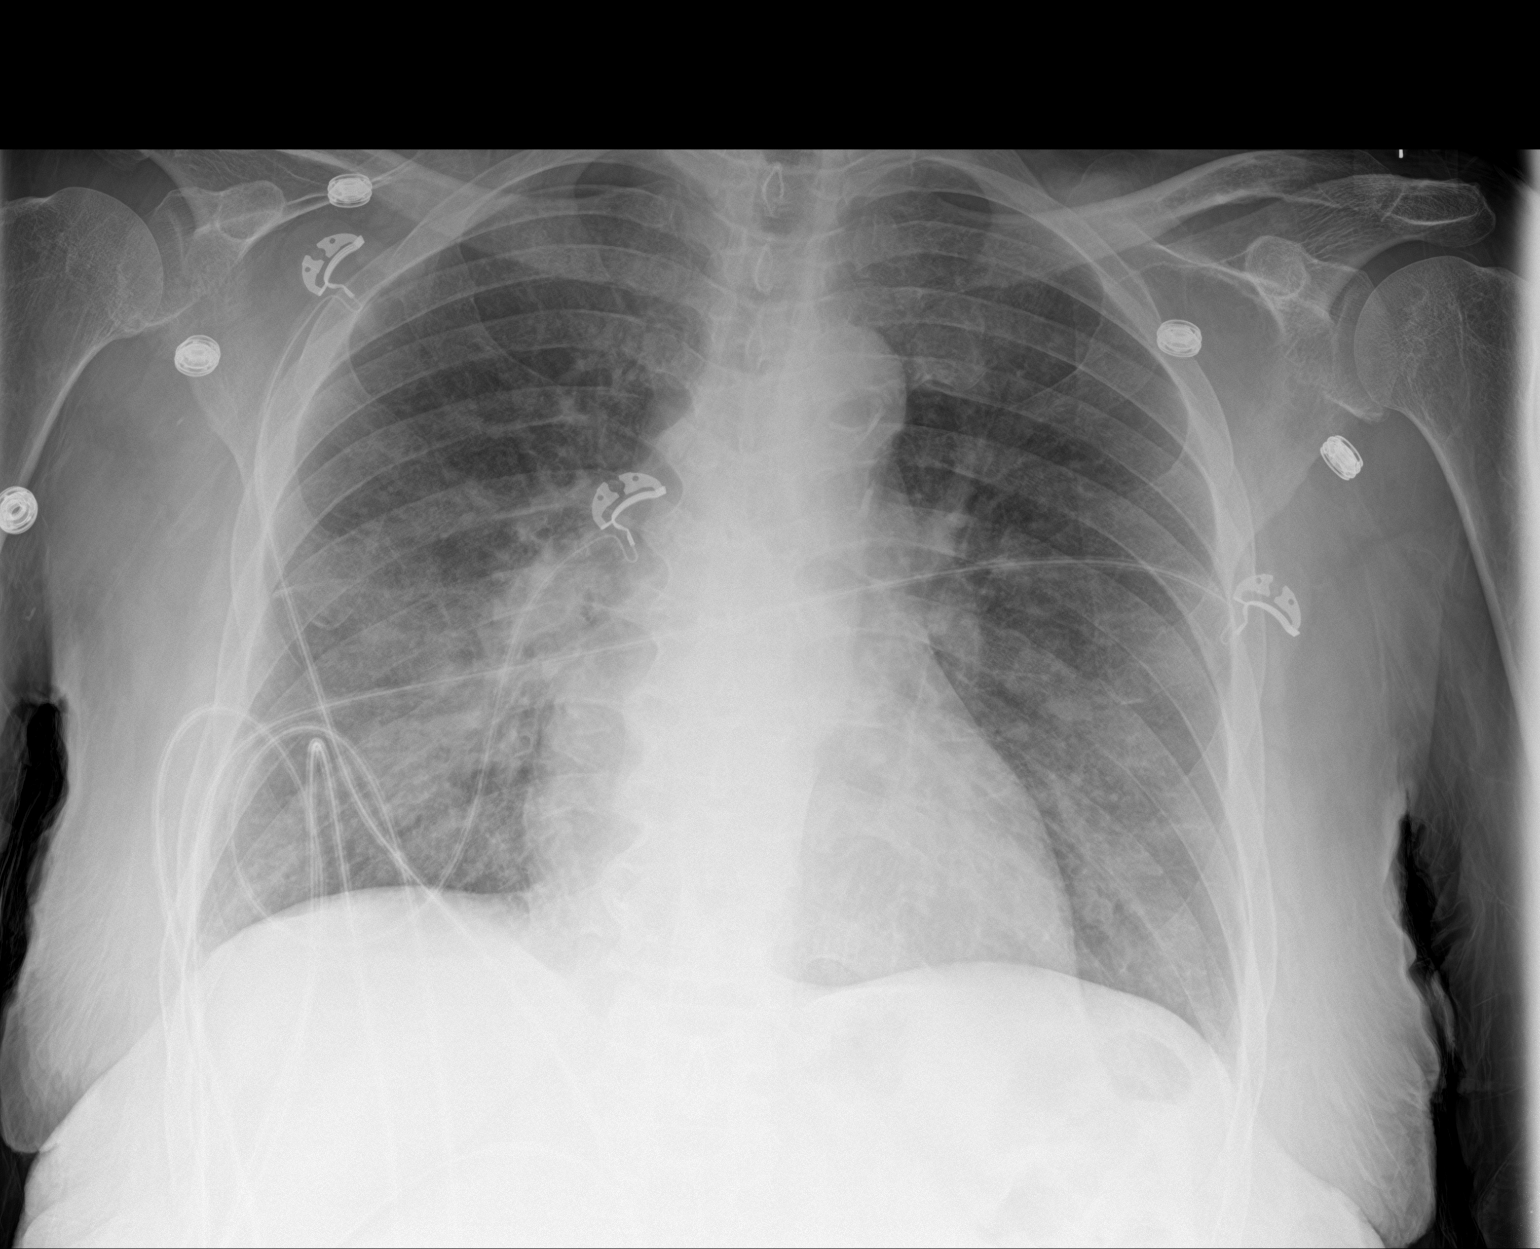

[1 of 1 positions shown; findings below may reference images not displayed]

FINDINGS: Cardiac shadow is stable. Aortic calcifications are seen. The lungs
are well aerated bilaterally. Increased airspace opacity is noted in
the right base consistent with atelectasis/early infiltrate. No
sizable effusion is noted. No acute bony abnormality is seen.
IMPRESSION: Increased airspace opacity in the right base likely representing
atelectasis/early infiltrate.
